# Patient Record
Sex: Female | Born: 1995
Health system: Southern US, Community
[De-identification: ages and names within clinical notes are randomized; demographics above are authoritative.]

## PROBLEM LIST (undated history)

## (undated) ENCOUNTER — Inpatient Hospital Stay (HOSPITAL_COMMUNITY): Payer: Self-pay

## (undated) DIAGNOSIS — F419 Anxiety disorder, unspecified: Secondary | ICD-10-CM

## (undated) DIAGNOSIS — Z3490 Encounter for supervision of normal pregnancy, unspecified, unspecified trimester: Secondary | ICD-10-CM

## (undated) DIAGNOSIS — O139 Gestational [pregnancy-induced] hypertension without significant proteinuria, unspecified trimester: Secondary | ICD-10-CM

## (undated) DIAGNOSIS — R05 Cough: Secondary | ICD-10-CM

## (undated) DIAGNOSIS — R058 Other specified cough: Secondary | ICD-10-CM

## (undated) DIAGNOSIS — J189 Pneumonia, unspecified organism: Secondary | ICD-10-CM

## (undated) DIAGNOSIS — M329 Systemic lupus erythematosus, unspecified: Secondary | ICD-10-CM

## (undated) DIAGNOSIS — R0789 Other chest pain: Secondary | ICD-10-CM

## (undated) DIAGNOSIS — IMO0001 Reserved for inherently not codable concepts without codable children: Secondary | ICD-10-CM

## (undated) DIAGNOSIS — J45909 Unspecified asthma, uncomplicated: Secondary | ICD-10-CM

## (undated) HISTORY — DX: Anxiety disorder, unspecified: F41.9

## (undated) HISTORY — DX: Gestational (pregnancy-induced) hypertension without significant proteinuria, unspecified trimester: O13.9

---
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
## 2014-09-10 ENCOUNTER — Encounter (HOSPITAL_COMMUNITY): Payer: Self-pay | Admitting: Emergency Medicine

## 2014-09-10 ENCOUNTER — Emergency Department (HOSPITAL_COMMUNITY): Payer: BLUE CROSS/BLUE SHIELD

## 2014-09-10 ENCOUNTER — Emergency Department (HOSPITAL_COMMUNITY)
Admission: EM | Admit: 2014-09-10 | Discharge: 2014-09-10 | Disposition: A | Discharge status: Home / Self Care | Payer: BLUE CROSS/BLUE SHIELD | Attending: Emergency Medicine | Admitting: Emergency Medicine

## 2014-09-10 DIAGNOSIS — Z79899 Other long term (current) drug therapy: Secondary | ICD-10-CM | POA: Diagnosis not present

## 2014-09-10 DIAGNOSIS — M329 Systemic lupus erythematosus, unspecified: Secondary | ICD-10-CM | POA: Insufficient documentation

## 2014-09-10 DIAGNOSIS — R6883 Chills (without fever): Secondary | ICD-10-CM | POA: Diagnosis not present

## 2014-09-10 DIAGNOSIS — R112 Nausea with vomiting, unspecified: Secondary | ICD-10-CM | POA: Diagnosis not present

## 2014-09-10 DIAGNOSIS — R630 Anorexia: Secondary | ICD-10-CM | POA: Diagnosis not present

## 2014-09-10 DIAGNOSIS — R197 Diarrhea, unspecified: Secondary | ICD-10-CM | POA: Insufficient documentation

## 2014-09-10 DIAGNOSIS — Z7952 Long term (current) use of systemic steroids: Secondary | ICD-10-CM | POA: Insufficient documentation

## 2014-09-10 DIAGNOSIS — Z792 Long term (current) use of antibiotics: Secondary | ICD-10-CM | POA: Insufficient documentation

## 2014-09-10 DIAGNOSIS — Z720 Tobacco use: Secondary | ICD-10-CM | POA: Insufficient documentation

## 2014-09-10 DIAGNOSIS — M79642 Pain in left hand: Secondary | ICD-10-CM | POA: Diagnosis present

## 2014-09-10 HISTORY — DX: Systemic lupus erythematosus, unspecified: M32.9

## 2014-09-10 LAB — CBC WITH DIFFERENTIAL/PLATELET
BASOS ABS: 0 10*3/uL (ref 0.0–0.1)
Basophils Relative: 0 % (ref 0–1)
EOS PCT: 0 % (ref 0–5)
Eosinophils Absolute: 0 10*3/uL (ref 0.0–0.7)
HEMATOCRIT: 33.7 % — AB (ref 36.0–46.0)
Hemoglobin: 11.5 g/dL — ABNORMAL LOW (ref 12.0–15.0)
LYMPHS ABS: 1 10*3/uL (ref 0.7–4.0)
Lymphocytes Relative: 14 % (ref 12–46)
MCH: 28 pg (ref 26.0–34.0)
MCHC: 34.1 g/dL (ref 30.0–36.0)
MCV: 82 fL (ref 78.0–100.0)
MONOS PCT: 6 % (ref 3–12)
Monocytes Absolute: 0.4 10*3/uL (ref 0.1–1.0)
Neutro Abs: 5.5 10*3/uL (ref 1.7–7.7)
Neutrophils Relative %: 80 % — ABNORMAL HIGH (ref 43–77)
Platelets: 293 10*3/uL (ref 150–400)
RBC: 4.11 MIL/uL (ref 3.87–5.11)
RDW: 14.6 % (ref 11.5–15.5)
WBC: 6.9 10*3/uL (ref 4.0–10.5)

## 2014-09-10 LAB — COMPREHENSIVE METABOLIC PANEL
ALBUMIN: 3.2 g/dL — AB (ref 3.5–5.2)
ALT: 15 U/L (ref 0–35)
ANION GAP: 9 (ref 5–15)
AST: 22 U/L (ref 0–37)
Alkaline Phosphatase: 37 U/L — ABNORMAL LOW (ref 39–117)
BUN: 13 mg/dL (ref 6–23)
CALCIUM: 8.8 mg/dL (ref 8.4–10.5)
CO2: 24 mmol/L (ref 19–32)
CREATININE: 0.74 mg/dL (ref 0.50–1.10)
Chloride: 103 mEq/L (ref 96–112)
GFR calc Af Amer: >90 mL/min (ref >90)
GLUCOSE: 97 mg/dL (ref 70–99)
Potassium: 3.1 mmol/L — ABNORMAL LOW (ref 3.5–5.1)
Sodium: 136 mmol/L (ref 135–145)
Total Bilirubin: 0.5 mg/dL (ref 0.3–1.2)
Total Protein: 7 g/dL (ref 6.0–8.3)

## 2014-09-10 MED ORDER — HYDROMORPHONE HCL 1 MG/ML IJ SOLN
1.0000 mg | Freq: Once | INTRAMUSCULAR | Status: AC
  Administered 2014-09-10: 1 mg via INTRAMUSCULAR
  Filled 2014-09-10: qty 1

## 2014-09-10 MED ORDER — OXYCODONE-ACETAMINOPHEN 5-325 MG PO TABS
1.0000 | ORAL_TABLET | Freq: Once | ORAL | Status: AC
  Administered 2014-09-10: 1 via ORAL
  Filled 2014-09-10: qty 1

## 2014-09-10 MED ORDER — PREDNISONE 20 MG PO TABS: 60.0000 mg | ORAL_TABLET | Freq: Every day | ORAL | Status: DC

## 2014-09-10 MED ORDER — PREDNISONE 20 MG PO TABS
60.0000 mg | ORAL_TABLET | Freq: Once | ORAL | Status: AC
  Administered 2014-09-10: 60 mg via ORAL
  Filled 2014-09-10: qty 3

## 2014-09-10 MED ORDER — OXYCODONE-ACETAMINOPHEN 5-325 MG PO TABS: 1.0000 | ORAL_TABLET | Freq: Four times a day (QID) | ORAL | Status: DC | PRN

## 2014-09-10 NOTE — Discharge Instructions (Signed)
Lupus Lupus (also called systemic lupus erythematosus, SLE) is a disorder of the body's natural defense system (immune system). In lupus, the immune system attacks various areas of the body (autoimmune disease). CAUSES The cause is unknown. However, lupus runs in families. Certain genes can make you more likely to develop lupus. It is 10 times more common in women than in men. Lupus is also more common in African Americans and Asians. Other factors also play a role, such as viruses (Epstein-Barr virus, EBV), stress, hormones, cigarette smoke, and certain drugs. SYMPTOMS Lupus can affect many parts of the body, including the joints, skin, kidneys, lungs, heart, nervous system, and blood vessels. The signs and symptoms of lupus differ from person to person. The disease can range from mild to life-threatening. Typical features of lupus include:  Butterfly-shaped rash over the face.  Arthritis involving one or more joints.  Kidney disease.  Fever, weight loss, hair loss, fatigue.  Poor circulation in the fingers and toes (Raynaud's disease).  Chest pain when taking deep breaths. Abdominal pain may also occur.  Skin rash in areas exposed to the sun.  Sores in the mouth and nose. DIAGNOSIS Diagnosing lupus can take a long time and is often difficult. An exam and an accurate account of your symptoms and health problems is very important. Blood tests are necessary, though no single test can confirm or rule out lupus. Most people with lupus test positive for antinuclear antibodies (ANA) on a blood test. Additional blood tests, a urine test (urinalysis), and sometimes a kidney or skin tissue sample (biopsy) can help to confirm or rule out lupus. TREATMENT There is no cure for lupus. Your caregiver will develop a treatment plan based on your age, sex, health, symptoms, and lifestyle. The goals are to prevent flares, to treat them when they do occur, and to minimize organ damage and complications. How  the disease may affect each person varies widely. Most people with lupus can live normal lives, but this disorder must be carefully monitored. Treatment must be adjusted as necessary to prevent serious complications. Medicines used for treatment:  Nonsteroidal anti-inflammatory drugs (NSAIDs) decrease inflammation and can help with chest pain, joint pain, and fevers. Examples include ibuprofen and naproxen.  Antimalarial drugs were designed to treat malaria. They also treat fatigue, joint pain, skin rashes, and inflammation of the lungs in patients with lupus.  Corticosteroids are powerful hormones that rapidly suppress inflammation. The lowest dose with the highest benefit will be chosen. They can be given by cream, pills, injections, and through the vein (intravenously).  Immunosuppressive drugs block the making of immune cells. They may be used for kidney or nerve disease. HOME CARE INSTRUCTIONS  Exercise. Low-impact activities can usually help keep joints flexible without being too strenuous.  Rest after periods of exercise.  Avoid excessive sun exposure.  Follow proper nutrition and take supplements as recommended by your caregiver.  Stress management can be helpful. SEEK MEDICAL CARE IF:  You have increased fatigue.  You develop pain.  You develop a rash.  You have an oral temperature above 102 F (38.9 C).  You develop abdominal discomfort.  You develop a headache.  You experience dizziness. FOR MORE INFORMATION National Institute of Neurological Disorders and Stroke: www.ninds.nih.gov American College of Rheumatology: www.rheumatology.org National Institute of Arthritis and Musculoskeletal and Skin Diseases: www.niams.nih.gov Document Released: 08/03/2002 Document Revised: 11/05/2011 Document Reviewed: 11/24/2009 ExitCare Patient Information 2015 [employer]. This information is not intended to replace advice given to you by your health   care provider. Make sure  you discuss any questions you have with your health care provider.  

## 2014-09-10 NOTE — ED Provider Notes (Signed)
CSN: [id number]     Arrival date & time 09/10/14  0346 History   First MD Initiated Contact with Patient 09/10/14 [phone]     Chief Complaint  Patient presents with  . Joint Pain    The patient said she has Lupus and she is having a "flare up".  She is complaining of swelling and pain in both hands and her lower neck.     (Consider location/radiation/quality/duration/timing/severity/associated sxs/prior Treatment) The history is provided by the patient.   patient with history of lupus. On plaque on all and 10 mg of prednisone. Or last few days she has been feeling worse. She's had some chills. She has some mild nausea and diarrhea. Not unusual for her flares. She has aches and swelling in her hands and face and back. She is managed primarily for this at [facility]. No abdominal pain. No cough or dysuria.  Past Medical History  Diagnosis Date  . Lupus (systemic lupus erythematosus)    History reviewed. No pertinent past surgical history. History reviewed. No pertinent family history. History  Substance Use Topics  . Smoking status: Current Every Day Smoker  . Smokeless tobacco: Never Used  . Alcohol Use: No   OB History    No data available     Review of Systems  Constitutional: Positive for chills and appetite change. Negative for fever.  Respiratory: Negative for cough and shortness of breath.   Cardiovascular: Negative for chest pain.  Gastrointestinal: Positive for nausea, vomiting and diarrhea.  Genitourinary: Negative for flank pain.  Musculoskeletal: Positive for back pain.  Skin: Negative for wound.      Allergies  Review of patient's allergies indicates no known allergies.  Home Medications   Prior to Admission medications   Medication Sig Start Date End Date Taking? Authorizing Provider  hydroxychloroquine (PLAQUENIL) 200 MG tablet Take 1 tablet by mouth daily. 08/21/14  Yes Historical Provider, MD  NAPROXEN DR 500 MG EC tablet Take 1 tablet by mouth daily. 07/09/14   Yes Historical Provider, MD  Norgestim-Eth Charlott Holler Triphasic (ORTHO TRI-CYCLEN LO PO) Take 1 tablet by mouth daily.   Yes Historical Provider, MD  oxyCODONE-acetaminophen (PERCOCET/ROXICET) 5-325 MG per tablet Take 1-2 tablets by mouth every 6 (six) hours as needed for severe pain. 09/10/14   Juliet Rude. Kelci Petrella, MD  predniSONE (DELTASONE) 20 MG tablet Take 3 tablets (60 mg total) by mouth daily. 09/10/14   Juliet Rude. Mert Dietrick, MD   BP 108/54 mmHg  Pulse 85  Temp(Src) 99.5 F (37.5 C) (Oral)  Resp 18  SpO2 98%  LMP 08/16/2014 Physical Exam  Constitutional: She is oriented to person, place, and time. She appears well-developed and well-nourished.  Patient appears uncomfortable  Cardiovascular: Normal rate and regular rhythm.   Pulmonary/Chest: Effort normal.  Abdominal: Soft. There is no tenderness.  Musculoskeletal:  Some swelling of face and bilateral hands. Some pain with movement of the joints.  Neurological: She is alert and oriented to person, place, and time.  Skin: Skin is warm.    ED Course  Procedures (including critical care time) Labs Review Labs Reviewed  CBC WITH DIFFERENTIAL - Abnormal; Notable for the following:    Hemoglobin 11.5 (*)    HCT 33.7 (*)    Neutrophils Relative % 80 (*)    All other components within normal limits  COMPREHENSIVE METABOLIC PANEL - Abnormal; Notable for the following:    Potassium 3.1 (*)    Albumin 3.2 (*)    Alkaline Phosphatase 37 (*)  All other components within normal limits    Imaging Review No results found.   EKG Interpretation None      MDM   Final diagnoses:  Chills  Lupus (systemic lupus erythematosus)    Patient with lupus flare. No infection. Will increase patients steroids and pain control and follow with her Rheumatologist.    Juliet RudeNathan R. Rubin PayorPickering, MD 09/13/14 1323

## 2014-09-10 NOTE — ED Notes (Addendum)
The patient said she has Lupus and she is having a "flare up".  She is complaining of swelling and pain in both hands and her lower neck.  The patient said she took tylenol and it is not getting better.  Her pain has gotten progressively worse so she decided to come in.  She rates her pain 10/10.  [facility] by [facility] Rheumatologist.

## 2015-04-21 ENCOUNTER — Emergency Department (HOSPITAL_COMMUNITY)
Admission: EM | Admit: 2015-04-21 | Discharge: 2015-04-21 | Disposition: A | Discharge status: Home / Self Care | Payer: BLUE CROSS/BLUE SHIELD | Attending: Emergency Medicine | Admitting: Emergency Medicine

## 2015-04-21 ENCOUNTER — Emergency Department (HOSPITAL_COMMUNITY): Payer: BLUE CROSS/BLUE SHIELD

## 2015-04-21 ENCOUNTER — Encounter (HOSPITAL_COMMUNITY): Payer: Self-pay | Admitting: Neurology

## 2015-04-21 DIAGNOSIS — R059 Cough, unspecified: Secondary | ICD-10-CM

## 2015-04-21 DIAGNOSIS — Z720 Tobacco use: Secondary | ICD-10-CM | POA: Insufficient documentation

## 2015-04-21 DIAGNOSIS — Z7952 Long term (current) use of systemic steroids: Secondary | ICD-10-CM | POA: Diagnosis not present

## 2015-04-21 DIAGNOSIS — R0789 Other chest pain: Secondary | ICD-10-CM | POA: Diagnosis not present

## 2015-04-21 DIAGNOSIS — R05 Cough: Secondary | ICD-10-CM | POA: Diagnosis present

## 2015-04-21 DIAGNOSIS — Z8701 Personal history of pneumonia (recurrent): Secondary | ICD-10-CM | POA: Diagnosis not present

## 2015-04-21 DIAGNOSIS — Z79899 Other long term (current) drug therapy: Secondary | ICD-10-CM | POA: Insufficient documentation

## 2015-04-21 DIAGNOSIS — M549 Dorsalgia, unspecified: Secondary | ICD-10-CM | POA: Insufficient documentation

## 2015-04-21 LAB — I-STAT CHEM 8, ED
BUN: 11 mg/dL (ref 6–20)
CALCIUM ION: 1.21 mmol/L (ref 1.12–1.23)
CHLORIDE: 106 mmol/L (ref 101–111)
Creatinine, Ser: 0.7 mg/dL (ref 0.44–1.00)
Glucose, Bld: 101 mg/dL — ABNORMAL HIGH (ref 65–99)
HEMATOCRIT: 34 % — AB (ref 36.0–46.0)
Hemoglobin: 11.6 g/dL — ABNORMAL LOW (ref 12.0–15.0)
POTASSIUM: 3.5 mmol/L (ref 3.5–5.1)
SODIUM: 142 mmol/L (ref 135–145)
TCO2: 22 mmol/L (ref 0–100)

## 2015-04-21 LAB — I-STAT BETA HCG BLOOD, ED (MC, WL, AP ONLY): I-stat hCG, quantitative: <5 m[IU]/mL (ref <5)

## 2015-04-21 MED ORDER — ALBUTEROL SULFATE HFA 108 (90 BASE) MCG/ACT IN AERS
2.0000 | INHALATION_SPRAY | RESPIRATORY_TRACT | Status: DC | PRN
  Administered 2015-04-21: 2 via RESPIRATORY_TRACT
  Filled 2015-04-21: qty 6.7

## 2015-04-21 MED ORDER — AEROCHAMBER PLUS W/MASK MISC
1.0000 | Freq: Once | Status: AC
  Administered 2015-04-21: 1

## 2015-04-21 NOTE — ED Provider Notes (Signed)
CSN: [id number]     Arrival date & time 04/21/15  1516 History   First MD Initiated Contact with Patient 04/21/15 1631     Chief Complaint  Patient presents with  . Cough     (Consider location/radiation/quality/duration/timing/severity/associated sxs/prior Treatment) HPI Comments: She is a 19 year old with a history of lupus, and pneumonia with effusion approximately 1 year ago who presents for cough 2 weeks. No fever. The cough comes and goes. Occasionally patient has pain in her back. No vomiting.  Patient is a Consulting civil engineer at [employer], and her primary doctor is in [place]  Patient is a 19 y.o. female presenting with cough. The history is provided by the patient. No language interpreter was used.  Cough Cough characteristics:  Non-productive Severity:  Moderate Onset quality:  Sudden Duration:  2 weeks Timing:  Intermittent Progression:  Unchanged Chronicity:  New Context: upper respiratory infection   Relieved by:  None tried Worsened by:  Nothing tried Ineffective treatments:  None tried Associated symptoms: no chills, no diaphoresis, no fever, no rash and no weight loss   Risk factors: no recent infection     Past Medical History  Diagnosis Date  . Lupus (systemic lupus erythematosus)    History reviewed. No pertinent past surgical history. No family history on file. Social History  Substance Use Topics  . Smoking status: Current Every Day Smoker  . Smokeless tobacco: Never Used  . Alcohol Use: No   OB History    No data available     Review of Systems  Constitutional: Negative for fever, chills, weight loss and diaphoresis.  Respiratory: Positive for cough.   Skin: Negative for rash.  All other systems reviewed and are negative.     Allergies  Review of patient's allergies indicates no known allergies.  Home Medications   Prior to Admission medications   Medication Sig Start Date End Date Taking? Authorizing Provider  hydroxychloroquine (PLAQUENIL) 200 MG  tablet Take 1 tablet by mouth daily. 08/21/14   Historical Provider, MD  NAPROXEN DR 500 MG EC tablet Take 1 tablet by mouth daily. 07/09/14   Historical Provider, MD  Norgestim-Eth Charlott Holler Triphasic (ORTHO TRI-CYCLEN LO PO) Take 1 tablet by mouth daily.    Historical Provider, MD  oxyCODONE-acetaminophen (PERCOCET/ROXICET) 5-325 MG per tablet Take 1-2 tablets by mouth every 6 (six) hours as needed for severe pain. 09/10/14   Benjiman Core, MD  predniSONE (DELTASONE) 20 MG tablet Take 3 tablets (60 mg total) by mouth daily. 09/10/14   Benjiman Core, MD   BP 107/83 mmHg  Pulse 67  Temp(Src) 98.4 F (36.9 C) (Oral)  Resp 16  SpO2 98%  LMP 03/26/2015 Physical Exam  Constitutional: She is oriented to person, place, and time. She appears well-developed and well-nourished.  HENT:  Head: Normocephalic and atraumatic.  Right Ear: External ear normal.  Left Ear: External ear normal.  Mouth/Throat: Oropharynx is clear and moist.  Eyes: Conjunctivae and EOM are normal.  Neck: Normal range of motion. Neck supple.  Cardiovascular: Normal rate, normal heart sounds and intact distal pulses.   Pulmonary/Chest: Effort normal and breath sounds normal. She has no wheezes. She has no rales. She exhibits no tenderness.  Abdominal: Soft. Bowel sounds are normal. There is no tenderness. There is no rebound.  Musculoskeletal: Normal range of motion.  Neurological: She is alert and oriented to person, place, and time.  Skin: Skin is warm.  Nursing note and vitals reviewed.   ED Course  Procedures (including critical care time)  Labs Review Labs Reviewed  I-STAT CHEM 8, ED - Abnormal; Notable for the following:    Glucose, Bld 101 (*)    Hemoglobin 11.6 (*)    HCT 34.0 (*)    All other components within normal limits  I-STAT BETA HCG BLOOD, ED ([facility], [facility], [facility] ONLY)    Imaging Review Dg Chest 2 View  04/21/2015   CLINICAL DATA:  Cough x2 weeks, chest pain x1 week  EXAM: CHEST  2 VIEW  COMPARISON:   09/10/2014  FINDINGS: Lungs are clear.  No pleural effusion or pneumothorax.  The heart is normal in size.  Visualized osseous structures are within normal limits.  IMPRESSION: Normal chest radiographs.   Electronically Signed   By: Charline Bills M.D.   On: 04/21/2015 15:59   I have personally reviewed and evaluated these images and lab results as part of my medical decision-making.   EKG Interpretation   Date/Time:  Thursday April 21 2015 15:21:29 EDT Ventricular Rate:  94 PR Interval:  144 QRS Duration: 80 QT Interval:  342 QTC Calculation: 427 R Axis:   80 Text Interpretation:  Normal sinus rhythm Nonspecific T wave abnormality  Abnormal ECG no stemi, normal qtc, no delta Confirmed by Tonette Lederer MD, Tenny Craw  [phone]) on 04/21/2015 4:58:34 PM      MDM   Final diagnoses:  Cough    19 year old who presents for cough and chest tightness 2 week.  Patient with a history of pneumonia and effusion. We'll obtain chest x-ray, will obtain EKG.   Will obtain istat and hcg.  Labs reviewed and normal.  ekg with normal sinus.  cxr show no pneumonia, no effusion.   Will do a trial of albuterol for home.  Discussed signs that warrant reevaluation. Will have follow up with pcp in 1 week if not improved.      Niel Hummer, MD 04/21/15 [phone]

## 2015-04-21 NOTE — ED Notes (Signed)
Pt reports cough for 2 weeks and tightness in chest for 1 week that comes and goes. Pt is a x 4. Denies cardiac hx, has lupus.

## 2015-04-21 NOTE — Discharge Instructions (Signed)
Cough, Adult  A cough is a reflex that helps clear your throat and airways. It can help heal the body or may be a reaction to an irritated airway. A cough may only last 2 or 3 weeks (acute) or may last more than 8 weeks (chronic).  CAUSES Acute cough:  Viral or bacterial infections. Chronic cough:  Infections.  Allergies.  Asthma.  Post-nasal drip.  Smoking.  Heartburn or acid reflux.  Some medicines.  Chronic lung problems (COPD).  Cancer. SYMPTOMS   Cough.  Fever.  Chest pain.  Increased breathing rate.  High-pitched whistling sound when breathing (wheezing).  Colored mucus that you cough up (sputum). TREATMENT   A bacterial cough may be treated with antibiotic medicine.  A viral cough must run its course and will not respond to antibiotics.  Your caregiver may recommend other treatments if you have a chronic cough. HOME CARE INSTRUCTIONS   Only take over-the-counter or prescription medicines for pain, discomfort, or fever as directed by your caregiver. Use cough suppressants only as directed by your caregiver.  Use a cold steam vaporizer or humidifier in your bedroom or home to help loosen secretions.  Sleep in a semi-upright position if your cough is worse at night.  Rest as needed.  Stop smoking if you smoke. SEEK IMMEDIATE MEDICAL CARE IF:   You have pus in your sputum.  Your cough starts to worsen.  You cannot control your cough with suppressants and are losing sleep.  You begin coughing up blood.  You have difficulty breathing.  You develop pain which is getting worse or is uncontrolled with medicine.  You have a fever. MAKE SURE YOU:   Understand these instructions.  Will watch your condition.  Will get help right away if you are not doing well or get worse. Document Released: 02/09/2011 Document Revised: 11/05/2011 Document Reviewed: 02/09/2011 ExitCare Patient Information 2015 [employer]. This information is not intended  to replace advice given to you by your health care provider. Make sure you discuss any questions you have with your health care provider.  

## 2015-06-17 ENCOUNTER — Emergency Department (HOSPITAL_COMMUNITY)
Admission: EM | Admit: 2015-06-17 | Discharge: 2015-06-17 | Disposition: A | Discharge status: Home / Self Care | Payer: BLUE CROSS/BLUE SHIELD | Attending: Emergency Medicine | Admitting: Emergency Medicine

## 2015-06-17 ENCOUNTER — Encounter (HOSPITAL_COMMUNITY): Payer: Self-pay | Admitting: Cardiology

## 2015-06-17 DIAGNOSIS — R51 Headache: Secondary | ICD-10-CM | POA: Diagnosis present

## 2015-06-17 DIAGNOSIS — Z3202 Encounter for pregnancy test, result negative: Secondary | ICD-10-CM | POA: Insufficient documentation

## 2015-06-17 DIAGNOSIS — M328 Other forms of systemic lupus erythematosus: Secondary | ICD-10-CM | POA: Diagnosis not present

## 2015-06-17 DIAGNOSIS — M329 Systemic lupus erythematosus, unspecified: Secondary | ICD-10-CM

## 2015-06-17 DIAGNOSIS — Z79899 Other long term (current) drug therapy: Secondary | ICD-10-CM | POA: Diagnosis not present

## 2015-06-17 DIAGNOSIS — Z7952 Long term (current) use of systemic steroids: Secondary | ICD-10-CM | POA: Diagnosis not present

## 2015-06-17 DIAGNOSIS — Z791 Long term (current) use of non-steroidal anti-inflammatories (NSAID): Secondary | ICD-10-CM | POA: Insufficient documentation

## 2015-06-17 DIAGNOSIS — Z720 Tobacco use: Secondary | ICD-10-CM | POA: Insufficient documentation

## 2015-06-17 LAB — POC URINE PREG, ED: Preg Test, Ur: NEGATIVE

## 2015-06-17 MED ORDER — PREDNISONE 20 MG PO TABS: 60.0000 mg | ORAL_TABLET | Freq: Every day | ORAL | Status: DC

## 2015-06-17 MED ORDER — HYDROXYCHLOROQUINE SULFATE 200 MG PO TABS: 200.0000 mg | ORAL_TABLET | Freq: Every day | ORAL | Status: DC

## 2015-06-17 MED ORDER — OXYCODONE-ACETAMINOPHEN 5-325 MG PO TABS: 1.0000 | ORAL_TABLET | Freq: Four times a day (QID) | ORAL | Status: DC | PRN

## 2015-06-17 MED ORDER — OXYCODONE-ACETAMINOPHEN 5-325 MG PO TABS
2.0000 | ORAL_TABLET | Freq: Once | ORAL | Status: AC
  Administered 2015-06-17: 2 via ORAL
  Filled 2015-06-17: qty 2

## 2015-06-17 MED ORDER — PREDNISONE 20 MG PO TABS
60.0000 mg | ORAL_TABLET | Freq: Once | ORAL | Status: AC
  Administered 2015-06-17: 60 mg via ORAL
  Filled 2015-06-17: qty 3

## 2015-06-17 NOTE — ED Provider Notes (Signed)
CSN: [id number]     Arrival date & time 06/17/15  1334 History   First MD Initiated Contact with Patient 06/17/15 1500     Chief Complaint  Patient presents with  . Headache  . Nausea     (Consider location/radiation/quality/duration/timing/severity/associated sxs/prior Treatment) HPI Comments: Patient here complaining of flare of her lupus times several days. She notes pain in her joints which is similar to her prior lupus flares. She has been out of her Plaquenil for over a month. Has been compliant with her chronic prednisone dose. Denies any fever or chills. No cough or congestion. Denies any dyspnea. No chest pain or abdominal pain. No increased rashes noted. Pain is characterized as persistent and sharp worse with movement and better with rest. Has used Tylenol without relief  Patient is a [age] y.o. female presenting with headaches. The history is provided by the patient.  Headache   Past Medical History  Diagnosis Date  . Lupus (systemic lupus erythematosus) (HCC)    History reviewed. No pertinent past surgical history. History reviewed. No pertinent family history. Social History  Substance Use Topics  . Smoking status: Current Every Day Smoker  . Smokeless tobacco: Never Used  . Alcohol Use: Yes   OB History    No data available     Review of Systems  Neurological: Positive for headaches.  All other systems reviewed and are negative.     Allergies  Review of patient's allergies indicates no known allergies.  Home Medications   Prior to Admission medications   Medication Sig Start Date End Date Taking? Authorizing Provider  hydroxychloroquine (PLAQUENIL) 200 MG tablet Take 1 tablet by mouth daily. 08/21/14   Historical Provider, MD  NAPROXEN DR 500 MG EC tablet Take 1 tablet by mouth daily. 07/09/14   Historical Provider, MD  Norgestim-Eth Charlott HollerEstrad Triphasic (ORTHO TRI-CYCLEN LO PO) Take 1 tablet by mouth daily.    Historical Provider, MD   oxyCODONE-acetaminophen (PERCOCET/ROXICET) 5-325 MG per tablet Take 1-2 tablets by mouth every 6 (six) hours as needed for severe pain. 09/10/14   Benjiman CoreNathan Pickering, MD  predniSONE (DELTASONE) 20 MG tablet Take 3 tablets (60 mg total) by mouth daily. 09/10/14   Benjiman CoreNathan Pickering, MD   BP 127/88 mmHg  Pulse 97  Temp(Src) 97.8 F (36.6 C) (Oral)  Resp 18  Wt 130 lb 6.4 oz (59.149 kg)  SpO2 100%  LMP 06/09/2015 Physical Exam  Constitutional: She is oriented to person, place, and time. She appears well-developed and well-nourished.  Non-toxic appearance. No distress.  HENT:  Head: Normocephalic and atraumatic. Head is without abrasion, without contusion and without laceration.  Nose: Nose normal.  Mouth/Throat: Oropharynx is clear and moist and mucous membranes are normal.  Eyes: Conjunctivae and EOM are normal. Pupils are equal, round, and reactive to light.  Neck: Trachea normal and normal range of motion. Neck supple. No spinous process tenderness and no muscular tenderness present. No tracheal deviation present.  Cardiovascular: Normal rate, regular rhythm, normal heart sounds and normal pulses.  Exam reveals no gallop.   No murmur heard. Pulmonary/Chest: Effort normal. No stridor. No respiratory distress. She has no decreased breath sounds. She has no wheezes. She exhibits no crepitus, no deformity and no swelling.  Abdominal: Soft. Normal appearance and bowel sounds are normal. She exhibits no distension. There is no tenderness. There is no rebound and no CVA tenderness.  Musculoskeletal: Normal range of motion. She exhibits no edema or tenderness.  Neurological: She is alert and oriented to  person, place, and time. She has normal strength. No cranial nerve deficit or sensory deficit. GCS eye subscore is 4. GCS verbal subscore is 5. GCS motor subscore is 6.  Skin: Skin is warm and dry.  Psychiatric: She has a normal mood and affect. Her speech is normal and behavior is normal.  Nursing  note and vitals reviewed.   ED Course  Procedures (including critical care time) Labs Review Labs Reviewed  POC URINE PREG, ED    Imaging Review No results found. I have personally reviewed and evaluated these images and lab results as part of my medical decision-making.   EKG Interpretation None      MDM   Final diagnoses:  None    Patient will be placed on prednisone, given refill for her prescription for Plaquenil, and placed on increasing dose of prednisone    Lorre Nick, MD 06/17/15 1630

## 2015-06-17 NOTE — ED Notes (Signed)
Reports a headache and nausea that has been intermittent for the past couple of months. Also reports joint pain in the left ring finger.

## 2015-06-17 NOTE — Discharge Instructions (Signed)
Systemic Lupus Erythematosus, Adult  Systemic lupus erythematosus is a long-term (chronic) disease that can affect many parts of the body. It can damage the skin, joints, blood vessels, brain, kidneys, lungs, heart, and other internal organs. It causes pain, irritation, and inflammation.  Systemic lupus erythematosus is an autoimmune disease. With this type of disease, the body's defense system (immune system) mistakenly attacks normal tissues instead of attacking germs or abnormal growths.  CAUSES  The cause of this condition is not known.  RISK FACTORS  This condition is more likely to develop in:  · Females.  · People of Asian descent.  · People of African-American descent.  · People who have a family history of the condition.  SYMPTOMS  General symptoms include:  · Joint pain and swelling (common).  · Fever.  · Fatigue.  · Unusual weight loss or weight gain.  · Skin rashes, especially over the nose and cheeks (butterfly rash) and after sun exposure.  · Sores inside the mouth or nose.  Other symptoms depend on which parts of the body are affected. They can include:  · Shortness of breath.  · Chest pain.  · Frequent urination.  · Blood in the urine.  · Seizures.  · Mental changes.  · Hair loss.  · Swollen and tender lymph nodes.  · Swelling of the hands or feet.  Symptoms can come and go. A period of time when symptoms get worse or come back is called a flare. A period of time with no symptoms is called a remission.  DIAGNOSIS  This condition is diagnosed based on symptoms, a medical history, and a physical exam. You may also have tests, including:  · Blood tests.  · Urine tests.  · A chest X-ray.  · A skin or kidney biopsy. For this test, a sample of tissue is taken from the skin or kidney and studied under a microscope.  You may be referred to an autoimmune disease specialist (rheumatologist).  TREATMENT  There is no cure for this condition, but treatment can keep the disease in remission, help to control  symptoms, and prevent damage to the heart, lungs, kidneys, and other organs. Treatment may involve taking a combination of medicines over time.  HOME CARE INSTRUCTIONS  Medicines  · Take medicines only as directed by your health care provider.  · Do not take any medicines that contain estrogen without first checking with your health care provider. Estrogen can trigger flares and may increase your risk for blood clots.  Lifestyle  · Eat a heart-healthy diet.  · Stay active as directed by your health care provider.  · Do not smoke. If you need help quitting, ask your health care provider.  · Protect your skin from the sun by applying sunblock and wearing protective hats and clothing.  · Learn as much as you can about your condition and have a good support system in place. Support may come from family, friends, or a lupus support group.  General Instructions  · Keep all follow-up visits as directed by your health care provider. This is important.  · Work closely with all of your health care providers to manage your condition.  · Let your health care provider know right away if you become pregnant or if you plan to become pregnant. Pregnancy in women with this condition is considered high risk.  SEEK MEDICAL CARE IF:  · You have a fever.  · Your symptoms flare.  · You develop new symptoms.  ·   You develop swollen feet or hands.  · You develop puffiness around your eyes.  · Your medicines are not working.  · You have bloody, foamy, or coffee-colored urine.  · There are changes in your urination. For example, you urinate more often at night.  · You think that you may be depressed or have anxiety.  SEEK IMMEDIATE MEDICAL CARE IF:  · You have chest pain.  · You have trouble breathing.  · You have a seizure.  · You suddenly get a very bad headache.  · You suddenly develop facial or body weakness.  · You cannot speak.  · You cannot understand speech.     This information is not intended to replace advice given to you by your  health care provider. Make sure you discuss any questions you have with your health care provider.     Document Released: 08/03/2002 Document Revised: 12/28/2014 Document Reviewed: 07/21/2014  Elsevier Interactive Patient Education ©2016 [employer]

## 2015-06-17 NOTE — ED Notes (Signed)
Pt reports not having one of her lupus meds for over a month. Sts she hasnt seen her rheumatologist in a while sice she is here for school . Pt sts "It hink my lupus is flaring up"

## 2015-08-01 ENCOUNTER — Encounter (HOSPITAL_COMMUNITY): Payer: Self-pay | Admitting: *Deleted

## 2015-08-01 ENCOUNTER — Emergency Department (HOSPITAL_COMMUNITY): Payer: BLUE CROSS/BLUE SHIELD

## 2015-08-01 ENCOUNTER — Emergency Department (HOSPITAL_COMMUNITY)
Admission: EM | Admit: 2015-08-01 | Discharge: 2015-08-02 | Disposition: A | Discharge status: Home / Self Care | Payer: BLUE CROSS/BLUE SHIELD | Attending: Emergency Medicine | Admitting: Emergency Medicine

## 2015-08-01 DIAGNOSIS — N390 Urinary tract infection, site not specified: Secondary | ICD-10-CM | POA: Diagnosis not present

## 2015-08-01 DIAGNOSIS — Z79899 Other long term (current) drug therapy: Secondary | ICD-10-CM | POA: Insufficient documentation

## 2015-08-01 DIAGNOSIS — R509 Fever, unspecified: Secondary | ICD-10-CM | POA: Diagnosis present

## 2015-08-01 DIAGNOSIS — F17200 Nicotine dependence, unspecified, uncomplicated: Secondary | ICD-10-CM | POA: Diagnosis not present

## 2015-08-01 DIAGNOSIS — Z3202 Encounter for pregnancy test, result negative: Secondary | ICD-10-CM | POA: Insufficient documentation

## 2015-08-01 DIAGNOSIS — R000 Tachycardia, unspecified: Secondary | ICD-10-CM | POA: Diagnosis not present

## 2015-08-01 DIAGNOSIS — R51 Headache: Secondary | ICD-10-CM | POA: Insufficient documentation

## 2015-08-01 LAB — CBC
HCT: 35.1 % — ABNORMAL LOW (ref 36.0–46.0)
Hemoglobin: 11.4 g/dL — ABNORMAL LOW (ref 12.0–15.0)
MCH: 28.1 pg (ref 26.0–34.0)
MCHC: 32.5 g/dL (ref 30.0–36.0)
MCV: 86.7 fL (ref 78.0–100.0)
PLATELETS: 221 10*3/uL (ref 150–400)
RBC: 4.05 MIL/uL (ref 3.87–5.11)
RDW: 14.3 % (ref 11.5–15.5)
WBC: 7.6 10*3/uL (ref 4.0–10.5)

## 2015-08-01 LAB — COMPREHENSIVE METABOLIC PANEL
ALK PHOS: 38 U/L (ref 38–126)
ALT: 14 U/L (ref 14–54)
AST: 18 U/L (ref 15–41)
Albumin: 3.2 g/dL — ABNORMAL LOW (ref 3.5–5.0)
Anion gap: 4 — ABNORMAL LOW (ref 5–15)
BILIRUBIN TOTAL: 0.8 mg/dL (ref 0.3–1.2)
BUN: 8 mg/dL (ref 6–20)
CHLORIDE: 103 mmol/L (ref 101–111)
CO2: 28 mmol/L (ref 22–32)
Calcium: 8.3 mg/dL — ABNORMAL LOW (ref 8.9–10.3)
Creatinine, Ser: 0.87 mg/dL (ref 0.44–1.00)
GFR calc Af Amer: >60 mL/min (ref >60)
Glucose, Bld: 107 mg/dL — ABNORMAL HIGH (ref 65–99)
Potassium: 3.5 mmol/L (ref 3.5–5.1)
Sodium: 135 mmol/L (ref 135–145)
TOTAL PROTEIN: 6.5 g/dL (ref 6.5–8.1)

## 2015-08-01 LAB — I-STAT CG4 LACTIC ACID, ED
Lactic Acid, Venous: 1.38 mmol/L (ref 0.5–2.0)
Lactic Acid, Venous: 1.23 mmol/L (ref 0.5–2.0)

## 2015-08-01 LAB — URINE MICROSCOPIC-ADD ON

## 2015-08-01 LAB — URINALYSIS, ROUTINE W REFLEX MICROSCOPIC
Glucose, UA: NEGATIVE mg/dL
HGB URINE DIPSTICK: NEGATIVE
Ketones, ur: 15 mg/dL — AB
Nitrite: NEGATIVE
Protein, ur: 30 mg/dL — AB
SPECIFIC GRAVITY, URINE: 1.027 (ref 1.005–1.030)
pH: 7.5 (ref 5.0–8.0)

## 2015-08-01 LAB — I-STAT BETA HCG BLOOD, ED (MC, WL, AP ONLY): I-stat hCG, quantitative: <5 m[IU]/mL (ref <5)

## 2015-08-01 MED ORDER — ACETAMINOPHEN 325 MG PO TABS
650.0000 mg | ORAL_TABLET | ORAL | Status: DC | PRN
  Administered 2015-08-01: 650 mg via ORAL
  Filled 2015-08-01: qty 2

## 2015-08-01 MED ORDER — IBUPROFEN 200 MG PO TABS: 600.0000 mg | ORAL_TABLET | Freq: Once | ORAL | Status: DC

## 2015-08-01 MED ORDER — CEFTRIAXONE SODIUM 1 G IJ SOLR
1.0000 g | Freq: Once | INTRAMUSCULAR | Status: AC
  Administered 2015-08-01: 1 g via INTRAVENOUS
  Filled 2015-08-01: qty 10

## 2015-08-01 MED ORDER — SODIUM CHLORIDE 0.9 % IV BOLUS (SEPSIS)
1000.0000 mL | Freq: Once | INTRAVENOUS | Status: AC
  Administered 2015-08-01: 1000 mL via INTRAVENOUS

## 2015-08-01 MED ORDER — ACETAMINOPHEN 325 MG PO TABS
ORAL_TABLET | ORAL | Status: DC
Start: 2015-08-01 — End: 2015-08-02
  Filled 2015-08-01: qty 2

## 2015-08-01 MED ORDER — ACETAMINOPHEN 325 MG PO TABS
650.0000 mg | ORAL_TABLET | Freq: Once | ORAL | Status: AC | PRN
  Administered 2015-08-01: 650 mg via ORAL

## 2015-08-01 NOTE — ED Provider Notes (Signed)
CSN: [id number]     Arrival date & time 08/01/15  1619 History   First MD Initiated Contact with Patient 08/01/15 2059     Chief Complaint  Patient presents with  . Fever  . Nausea     (Consider location/radiation/quality/duration/timing/severity/associated sxs/prior Treatment) HPI Patient presents with acute onset fever starting at 2 AM this morning. She states she's had 2 episodes of vomiting and diarrhea. Patient complains of mild throbbing frontal headache which is mostly resolved at this point. She denies any neck pain or stiffness. She states she's had some nasal congestion but denies any sore throat, chest pain, shortness of breath, cough, abdominal pain, myalgias, rash, urinary frequency, dysuria, hematuria or flank pain. No recent sick contacts. Past Medical History  Diagnosis Date  . Lupus (systemic lupus erythematosus) (HCC)    History reviewed. No pertinent past surgical history. History reviewed. No pertinent family history. Social History  Substance Use Topics  . Smoking status: Current Every Day Smoker  . Smokeless tobacco: Never Used  . Alcohol Use: Yes   OB History    No data available     Review of Systems  Constitutional: Positive for fever, chills, diaphoresis and appetite change. Negative for activity change.  HENT: Positive for congestion. Negative for ear pain, rhinorrhea, sinus pressure and sore throat.   Eyes: Negative for visual disturbance.  Respiratory: Negative for cough, shortness of breath and wheezing.   Cardiovascular: Negative for chest pain, palpitations and leg swelling.  Gastrointestinal: Positive for nausea, vomiting and diarrhea. Negative for abdominal pain, constipation and blood in stool.  Genitourinary: Negative for dysuria, frequency, flank pain, vaginal bleeding, vaginal discharge and difficulty urinating.  Musculoskeletal: Negative for myalgias, back pain, arthralgias, neck pain and neck stiffness.  Skin: Negative for rash and wound.   Neurological: Positive for headaches. Negative for dizziness, syncope, weakness, light-headedness and numbness.  Hematological: Negative for adenopathy.  All other systems reviewed and are negative.     Allergies  Review of patient's allergies indicates no known allergies.  Home Medications   Prior to Admission medications   Medication Sig Start Date End Date Taking? Authorizing Provider  albuterol (PROVENTIL HFA;VENTOLIN HFA) 108 (90 BASE) MCG/ACT inhaler Inhale 1-2 puffs into the lungs every 6 (six) hours as needed for wheezing or shortness of breath.   Yes Historical Provider, MD  hydroxychloroquine (PLAQUENIL) 200 MG tablet Take 1 tablet (200 mg total) by mouth daily. Patient taking differently: Take 300 mg by mouth daily.  06/17/15  Yes Lorre Nick, MD  mycophenolate (CELLCEPT) 500 MG tablet Take 1,000 mg by mouth 2 (two) times daily. 07/15/15 07/14/16 Yes Historical Provider, MD  predniSONE (DELTASONE) 10 MG tablet Take 10 mg by mouth daily. 07/15/15  Yes Historical Provider, MD  oxyCODONE-acetaminophen (PERCOCET/ROXICET) 5-325 MG tablet Take 1-2 tablets by mouth every 6 (six) hours as needed for severe pain. Patient not taking: Reported on 08/01/2015 06/17/15   Lorre Nick, MD  predniSONE (DELTASONE) 20 MG tablet Take 3 tablets (60 mg total) by mouth daily. Patient not taking: Reported on 08/01/2015 06/17/15   Lorre Nick, MD   BP 124/84 mmHg  Pulse 116  Temp(Src) 102 F (38.9 C) (Oral)  Resp 24  SpO2 100%  LMP 07/10/2015 Physical Exam  Constitutional: She is oriented to person, place, and time. She appears well-developed and well-nourished. No distress.  HENT:  Head: Normocephalic and atraumatic.  Mouth/Throat: Oropharynx is clear and moist.  No sinus tenderness. No tonsillar erythema or exudates. Uvula is midline. Patient has  bilateral nasal mucosal edema.  Eyes: EOM are normal. Pupils are equal, round, and reactive to light. Right eye exhibits no discharge. Left  eye exhibits no discharge.  Neck: Normal range of motion. Neck supple.  No meningismus  Cardiovascular: Regular rhythm.  Exam reveals no gallop and no friction rub.   No murmur heard. Tachycardia  Pulmonary/Chest: Effort normal and breath sounds normal. No respiratory distress. She has no wheezes. She has no rales. She exhibits no tenderness.  Abdominal: Soft. Bowel sounds are normal. She exhibits no distension and no mass. There is tenderness (patient with mild lower abdominal tenderness to palpation. Rebound or guarding.). There is no rebound and no guarding.  Musculoskeletal: Normal range of motion. She exhibits no edema or tenderness.  No CVA tenderness. No joint swelling, erythema or warmth. No lower extremity swelling or tenderness.  Lymphadenopathy:    She has no cervical adenopathy.  Neurological: She is alert and oriented to person, place, and time.  Skin: Skin is warm and dry. No rash noted. No erythema.  Psychiatric: She has a normal mood and affect. Her behavior is normal.  Nursing note and vitals reviewed.   ED Course  Procedures (including critical care time) Labs Review Labs Reviewed  COMPREHENSIVE METABOLIC PANEL - Abnormal; Notable for the following:    Glucose, Bld 107 (*)    Calcium 8.3 (*)    Albumin 3.2 (*)    Anion gap 4 (*)    All other components within normal limits  CBC - Abnormal; Notable for the following:    Hemoglobin 11.4 (*)    HCT 35.1 (*)    All other components within normal limits  URINALYSIS, ROUTINE W REFLEX MICROSCOPIC (NOT AT [facility]) - Abnormal; Notable for the following:    Color, Urine AMBER (*)    APPearance CLOUDY (*)    Bilirubin Urine SMALL (*)    Ketones, ur 15 (*)    Protein, ur 30 (*)    Leukocytes, UA SMALL (*)    All other components within normal limits  URINE MICROSCOPIC-ADD ON - Abnormal; Notable for the following:    Squamous Epithelial / LPF 6-30 (*)    Bacteria, UA MANY (*)    All other components within normal limits   I-STAT CG4 LACTIC ACID, ED  I-STAT BETA HCG BLOOD, ED ([facility], [facility], [facility] ONLY)  I-STAT CG4 LACTIC ACID, ED    Imaging Review Dg Chest 2 View  08/01/2015  CLINICAL DATA:  Acute onset of fever and diaphoresis. Joint aching. Initial encounter. EXAM: CHEST  2 VIEW COMPARISON:  Chest radiograph performed 04/21/2015 FINDINGS: The lungs are well-aerated and clear. There is no evidence of focal opacification, pleural effusion or pneumothorax. The heart is normal in size; the mediastinal contour is within normal limits. No acute osseous abnormalities are seen. Metallic nipple piercings are noted bilaterally. IMPRESSION: No acute cardiopulmonary process seen. Electronically Signed   By: Roanna Raider M.D.   On: 08/01/2015 21:52   I have personally reviewed and evaluated these images and lab results as part of my medical decision-making.   EKG Interpretation None      MDM   Final diagnoses:  UTI (lower urinary tract infection)  Fever, unspecified fever cause    Patient is very well-appearing. She is in no apparent distress. Has history of lupus and is currently on daily prednisone.  Patient continues to be very well-appearing. She is taking on the phone in no distress. Evidence of urinary tract infection on UA. Given IV  Rocephin in the emergency department. Patient's tachycardia has resolved. Stable blood pressure. Given the patient's normal labs and well appearance will discharge home to follow-up with her primary physician in one to 2 days. She understands the need to return immediately for any worsening symptoms or concerns.    Loren Raceravid Ronon Ferger, MD 08/02/15 [id number]

## 2015-08-01 NOTE — ED Notes (Addendum)
Pt reports that she has lupus. Having joint pain, fever, nausea, headache x 2 days. Febrile at triage, last took tylenol at 0700.

## 2015-08-02 DIAGNOSIS — N390 Urinary tract infection, site not specified: Secondary | ICD-10-CM | POA: Diagnosis not present

## 2015-08-02 MED ORDER — CEPHALEXIN 250 MG PO CAPS: 250.0000 mg | ORAL_CAPSULE | Freq: Four times a day (QID) | ORAL | Status: DC

## 2015-08-02 NOTE — Discharge Instructions (Signed)

## 2015-09-28 DIAGNOSIS — R0789 Other chest pain: Secondary | ICD-10-CM

## 2015-09-28 DIAGNOSIS — IMO0001 Reserved for inherently not codable concepts without codable children: Secondary | ICD-10-CM

## 2015-09-28 HISTORY — DX: Reserved for inherently not codable concepts without codable children: IMO0001

## 2015-09-28 HISTORY — DX: Other chest pain: R07.89

## 2015-10-04 ENCOUNTER — Other Ambulatory Visit: Payer: Self-pay | Admitting: Rheumatology

## 2015-10-04 DIAGNOSIS — R51 Headache: Principal | ICD-10-CM

## 2015-10-04 DIAGNOSIS — R519 Headache, unspecified: Secondary | ICD-10-CM

## 2015-10-04 DIAGNOSIS — M329 Systemic lupus erythematosus, unspecified: Secondary | ICD-10-CM

## 2015-10-10 ENCOUNTER — Ambulatory Visit
Admission: RE | Admit: 2015-10-10 | Discharge: 2015-10-10 | Disposition: A | Discharge status: Home / Self Care | Payer: BLUE CROSS/BLUE SHIELD | Source: Ambulatory Visit | Attending: Rheumatology | Admitting: Rheumatology

## 2015-10-10 DIAGNOSIS — R519 Headache, unspecified: Secondary | ICD-10-CM

## 2015-10-10 DIAGNOSIS — M329 Systemic lupus erythematosus, unspecified: Secondary | ICD-10-CM

## 2015-10-10 DIAGNOSIS — R51 Headache: Principal | ICD-10-CM

## 2015-10-20 ENCOUNTER — Encounter (HOSPITAL_COMMUNITY)
Admission: RE | Admit: 2015-10-20 | Discharge: 2015-10-20 | Disposition: A | Discharge status: Home / Self Care | Payer: BLUE CROSS/BLUE SHIELD | Source: Ambulatory Visit | Attending: Rheumatology | Admitting: Rheumatology

## 2015-10-20 ENCOUNTER — Encounter (HOSPITAL_COMMUNITY): Payer: Self-pay

## 2015-10-20 ENCOUNTER — Other Ambulatory Visit (HOSPITAL_COMMUNITY): Payer: Self-pay | Admitting: Rheumatology

## 2015-10-20 DIAGNOSIS — M329 Systemic lupus erythematosus, unspecified: Secondary | ICD-10-CM | POA: Diagnosis present

## 2015-10-20 MED ORDER — SODIUM CHLORIDE 0.9 % IV SOLN
Freq: Every day | INTRAVENOUS | Status: DC
  Administered 2015-10-20: 11:00:00 via INTRAVENOUS

## 2015-10-20 MED ORDER — BELIMUMAB 120 MG IV SOLR
10.0000 mg/kg | Freq: Once | INTRAVENOUS | Status: AC
  Administered 2015-10-20: 592 mg via INTRAVENOUS
  Filled 2015-10-20: qty 7.4

## 2015-10-20 NOTE — Progress Notes (Signed)
Uneventful 1st infusion of BENLYSTA over 1 hour with a 20 minute post infusion observation. Pt discharged ambulatory unaccompanied to elevator

## 2015-11-03 ENCOUNTER — Encounter (HOSPITAL_COMMUNITY)
Admission: RE | Admit: 2015-11-03 | Discharge: 2015-11-03 | Disposition: A | Discharge status: Home / Self Care | Payer: BLUE CROSS/BLUE SHIELD | Source: Ambulatory Visit | Attending: Rheumatology | Admitting: Rheumatology

## 2015-11-03 DIAGNOSIS — M329 Systemic lupus erythematosus, unspecified: Secondary | ICD-10-CM | POA: Insufficient documentation

## 2015-11-03 NOTE — Progress Notes (Signed)
First Benlysta infusion was 10/20/15.  Today would be pt's 2nd infusion.  While reviewing the [facility] checklist and asking about new or worsening symptoms since her last infusion pt states "Yes, I have had chest pain that comes on sudden and comes and goes over the last week. The chest pain is like someone standing on my chest. She states it lasts as long as a whole afternoon.  She said her shortness of breath is worse and she is using her PRN inhaler a lot more.  She said she feels more anxious and her hands are trembling more".  The symptoms are not occuring at the present.  But pt states they started about 1 week ago and Dr. Nickola MajorHawkes is unaware.  The last episode was about 2 days ago.  Informed pt I would have to call Dr.Hawkes office to see if we need to proceed with the infusion today.  Spoke with Roanna RaiderSherri, Dr.Hawkes nurse who ran all the info by Dr. Nickola MajorHawkes, herself and Dr. Nickola MajorHawkes states for pt to not be infused today and be seen by her PCP and then touch base with Dr. Nickola MajorHawkes office after that.  Then from there they would decide if and when pt will resume her Benlysta.  When this info was presented to the pt, she states she saw her PCP 2 days ago and he did some tests but pt could not explain what was done.  Informed pt to have her PCP send those results and findings to Dr. Nickola MajorHawkes.  Pt voiced understanding.  Explained to pt since these symptoms are new and ocurred since her last infusion it is unclear if it is related to the Benlysta infusion and needs to be evaluated.  Pt voiced understanding.  Pt was not infused today and was d/c ambulatory with parents.

## 2015-12-01 ENCOUNTER — Encounter (HOSPITAL_COMMUNITY): Payer: Self-pay

## 2015-12-01 ENCOUNTER — Encounter (HOSPITAL_COMMUNITY)
Admission: RE | Admit: 2015-12-01 | Discharge: 2015-12-01 | Disposition: A | Discharge status: Home / Self Care | Payer: BLUE CROSS/BLUE SHIELD | Source: Ambulatory Visit | Attending: Rheumatology | Admitting: Rheumatology

## 2015-12-01 DIAGNOSIS — M329 Systemic lupus erythematosus, unspecified: Secondary | ICD-10-CM | POA: Diagnosis not present

## 2015-12-01 HISTORY — DX: Other chest pain: R07.89

## 2015-12-01 HISTORY — DX: Reserved for inherently not codable concepts without codable children: IMO0001

## 2015-12-01 MED ORDER — SODIUM CHLORIDE 0.9 % IV SOLN
10.0000 mg/kg | Freq: Once | INTRAVENOUS | Status: AC
  Administered 2015-12-01: 592 mg via INTRAVENOUS
  Filled 2015-12-01: qty 7.4

## 2015-12-01 MED ORDER — SODIUM CHLORIDE 0.9 % IV SOLN
Freq: Every day | INTRAVENOUS | Status: DC
Start: 2015-12-01 — End: 2015-12-09
  Administered 2015-12-01: 13:00:00 via INTRAVENOUS

## 2015-12-01 NOTE — Progress Notes (Signed)
Pt received infusion in [facility], experienced difficulty in entering Pt on short stay computer log and assigning room in computer.  Pt care uneventful.

## 2015-12-01 NOTE — Progress Notes (Signed)
Denied further problems with chest discomfort after "changing inhalers" . Stated she did see her medical doctor after last visit and has not had further problems. Tolerated infusion of Benlysta today without complication.

## 2015-12-29 ENCOUNTER — Encounter (HOSPITAL_COMMUNITY)
Admission: RE | Admit: 2015-12-29 | Discharge: 2015-12-29 | Disposition: A | Discharge status: Home / Self Care | Payer: BLUE CROSS/BLUE SHIELD | Source: Ambulatory Visit | Attending: Rheumatology | Admitting: Rheumatology

## 2015-12-29 ENCOUNTER — Encounter (HOSPITAL_COMMUNITY): Payer: BLUE CROSS/BLUE SHIELD

## 2015-12-29 DIAGNOSIS — M329 Systemic lupus erythematosus, unspecified: Secondary | ICD-10-CM | POA: Diagnosis not present

## 2015-12-29 MED ORDER — BELIMUMAB 120 MG IV SOLR
10.0000 mg/kg | Freq: Once | INTRAVENOUS | Status: AC
  Administered 2015-12-29: 592 mg via INTRAVENOUS
  Filled 2015-12-29: qty 7.4

## 2015-12-29 MED ORDER — SODIUM CHLORIDE 0.9 % IV SOLN
INTRAVENOUS | Status: DC
  Administered 2015-12-29: 13:00:00 via INTRAVENOUS

## 2015-12-30 DIAGNOSIS — M329 Systemic lupus erythematosus, unspecified: Secondary | ICD-10-CM | POA: Diagnosis not present

## 2016-01-16 DIAGNOSIS — M2550 Pain in unspecified joint: Secondary | ICD-10-CM | POA: Diagnosis not present

## 2016-01-16 DIAGNOSIS — M329 Systemic lupus erythematosus, unspecified: Secondary | ICD-10-CM | POA: Diagnosis not present

## 2016-01-16 DIAGNOSIS — M064 Inflammatory polyarthropathy: Secondary | ICD-10-CM | POA: Diagnosis not present

## 2016-01-16 DIAGNOSIS — L932 Other local lupus erythematosus: Secondary | ICD-10-CM | POA: Diagnosis not present

## 2016-01-30 ENCOUNTER — Encounter (HOSPITAL_COMMUNITY)
Admission: RE | Admit: 2016-01-30 | Discharge: 2016-01-30 | Disposition: A | Discharge status: Home / Self Care | Payer: BLUE CROSS/BLUE SHIELD | Source: Ambulatory Visit | Attending: Rheumatology | Admitting: Rheumatology

## 2016-01-30 DIAGNOSIS — M329 Systemic lupus erythematosus, unspecified: Secondary | ICD-10-CM | POA: Insufficient documentation

## 2016-01-30 MED ORDER — SODIUM CHLORIDE 0.9 % IV SOLN
600.0000 mg | INTRAVENOUS | Status: DC
  Administered 2016-01-30: 600 mg via INTRAVENOUS
  Filled 2016-01-30: qty 7.5

## 2016-01-30 MED ORDER — SODIUM CHLORIDE 0.9 % IV SOLN
INTRAVENOUS | Status: DC
  Administered 2016-01-30: 14:00:00 via INTRAVENOUS

## 2016-01-30 NOTE — Progress Notes (Signed)
  January 30, 2016  Patient: Kelly [name]  Date of Birth: 24-Jul-1996  Date of Visit: 01/30/2016    To Whom It May Concern:  Kelly MuleJemariah Paterson was seen and treated in Short Stay for an infusion on 01/30/2016.  Return to work today  Sincerely,  Lamont SnowballJanet Cortavius Montesinos RN

## 2016-02-06 ENCOUNTER — Encounter (HOSPITAL_COMMUNITY): Payer: BLUE CROSS/BLUE SHIELD

## 2016-02-14 DIAGNOSIS — Z114 Encounter for screening for human immunodeficiency virus [HIV]: Secondary | ICD-10-CM | POA: Diagnosis not present

## 2016-02-14 DIAGNOSIS — Z113 Encounter for screening for infections with a predominantly sexual mode of transmission: Secondary | ICD-10-CM | POA: Diagnosis not present

## 2016-02-14 DIAGNOSIS — Z3009 Encounter for other general counseling and advice on contraception: Secondary | ICD-10-CM | POA: Diagnosis not present

## 2016-02-27 ENCOUNTER — Encounter (HOSPITAL_COMMUNITY): Admission: RE | Admit: 2016-02-27 | Payer: BLUE CROSS/BLUE SHIELD | Source: Ambulatory Visit

## 2016-03-02 DIAGNOSIS — Z30017 Encounter for initial prescription of implantable subdermal contraceptive: Secondary | ICD-10-CM | POA: Diagnosis not present

## 2016-03-02 DIAGNOSIS — Z3202 Encounter for pregnancy test, result negative: Secondary | ICD-10-CM | POA: Diagnosis not present

## 2016-03-13 ENCOUNTER — Encounter (HOSPITAL_COMMUNITY)
Admission: RE | Admit: 2016-03-13 | Discharge: 2016-03-13 | Disposition: A | Discharge status: Home / Self Care | Payer: BLUE CROSS/BLUE SHIELD | Source: Ambulatory Visit | Attending: Rheumatology | Admitting: Rheumatology

## 2016-03-13 ENCOUNTER — Encounter (HOSPITAL_COMMUNITY): Payer: Self-pay

## 2016-03-13 DIAGNOSIS — M2550 Pain in unspecified joint: Secondary | ICD-10-CM | POA: Diagnosis not present

## 2016-03-13 DIAGNOSIS — L932 Other local lupus erythematosus: Secondary | ICD-10-CM | POA: Diagnosis not present

## 2016-03-13 DIAGNOSIS — M329 Systemic lupus erythematosus, unspecified: Secondary | ICD-10-CM | POA: Insufficient documentation

## 2016-03-13 DIAGNOSIS — M064 Inflammatory polyarthropathy: Secondary | ICD-10-CM | POA: Diagnosis not present

## 2016-03-13 MED ORDER — BELIMUMAB 120 MG IV SOLR
600.0000 mg | INTRAVENOUS | Status: DC
  Administered 2016-03-13: 600 mg via INTRAVENOUS
  Filled 2016-03-13: qty 7.5

## 2016-03-13 MED ORDER — SODIUM CHLORIDE 0.9 % IV SOLN
INTRAVENOUS | Status: DC
  Administered 2016-03-13: 14:00:00 via INTRAVENOUS

## 2016-03-13 NOTE — Progress Notes (Signed)
Uneventful benlysta infusion.  Pt tolerated well.  Pt d/c ambulatory to lobby.

## 2016-03-21 DIAGNOSIS — Z118 Encounter for screening for other infectious and parasitic diseases: Secondary | ICD-10-CM | POA: Diagnosis not present

## 2016-03-21 DIAGNOSIS — N898 Other specified noninflammatory disorders of vagina: Secondary | ICD-10-CM | POA: Diagnosis not present

## 2016-03-21 DIAGNOSIS — R300 Dysuria: Secondary | ICD-10-CM | POA: Diagnosis not present

## 2016-03-21 DIAGNOSIS — Z113 Encounter for screening for infections with a predominantly sexual mode of transmission: Secondary | ICD-10-CM | POA: Diagnosis not present

## 2016-03-21 DIAGNOSIS — N760 Acute vaginitis: Secondary | ICD-10-CM | POA: Diagnosis not present

## 2016-03-26 ENCOUNTER — Encounter (HOSPITAL_COMMUNITY): Payer: BLUE CROSS/BLUE SHIELD

## 2016-04-02 DIAGNOSIS — N898 Other specified noninflammatory disorders of vagina: Secondary | ICD-10-CM | POA: Diagnosis not present

## 2016-04-02 DIAGNOSIS — N390 Urinary tract infection, site not specified: Secondary | ICD-10-CM | POA: Diagnosis not present

## 2016-04-06 ENCOUNTER — Encounter (HOSPITAL_COMMUNITY): Payer: Self-pay | Admitting: Emergency Medicine

## 2016-04-06 ENCOUNTER — Emergency Department (HOSPITAL_COMMUNITY): Payer: BLUE CROSS/BLUE SHIELD

## 2016-04-06 ENCOUNTER — Emergency Department (HOSPITAL_COMMUNITY)
Admission: EM | Admit: 2016-04-06 | Discharge: 2016-04-06 | Disposition: A | Discharge status: Home / Self Care | Payer: BLUE CROSS/BLUE SHIELD | Attending: Emergency Medicine | Admitting: Emergency Medicine

## 2016-04-06 DIAGNOSIS — F17200 Nicotine dependence, unspecified, uncomplicated: Secondary | ICD-10-CM | POA: Insufficient documentation

## 2016-04-06 DIAGNOSIS — R079 Chest pain, unspecified: Secondary | ICD-10-CM

## 2016-04-06 DIAGNOSIS — R0789 Other chest pain: Secondary | ICD-10-CM

## 2016-04-06 DIAGNOSIS — R071 Chest pain on breathing: Secondary | ICD-10-CM | POA: Diagnosis present

## 2016-04-06 LAB — I-STAT TROPONIN, ED
Troponin i, poc: 0 ng/mL (ref 0.00–0.08)
Troponin i, poc: 0 ng/mL (ref 0.00–0.08)

## 2016-04-06 LAB — BASIC METABOLIC PANEL
Anion gap: 10 (ref 5–15)
BUN: 10 mg/dL (ref 6–20)
CHLORIDE: 106 mmol/L (ref 101–111)
CO2: 24 mmol/L (ref 22–32)
CREATININE: 0.79 mg/dL (ref 0.44–1.00)
Calcium: 8.9 mg/dL (ref 8.9–10.3)
GFR calc Af Amer: >60 mL/min (ref >60)
GLUCOSE: 82 mg/dL (ref 65–99)
POTASSIUM: 3.3 mmol/L — AB (ref 3.5–5.1)
SODIUM: 140 mmol/L (ref 135–145)

## 2016-04-06 LAB — CBC
HEMATOCRIT: 35.8 % — AB (ref 36.0–46.0)
Hemoglobin: 12.1 g/dL (ref 12.0–15.0)
MCH: 28.4 pg (ref 26.0–34.0)
MCHC: 33.8 g/dL (ref 30.0–36.0)
MCV: 84 fL (ref 78.0–100.0)
PLATELETS: 231 10*3/uL (ref 150–400)
RBC: 4.26 MIL/uL (ref 3.87–5.11)
RDW: 14.4 % (ref 11.5–15.5)
WBC: 3.3 10*3/uL — AB (ref 4.0–10.5)

## 2016-04-06 LAB — D-DIMER, QUANTITATIVE: D-Dimer, Quant: 0.51 ug/mL-FEU — ABNORMAL HIGH (ref 0.00–0.50)

## 2016-04-06 MED ORDER — IOPAMIDOL (ISOVUE-370) INJECTION 76%
INTRAVENOUS | Status: AC
  Administered 2016-04-06: 80 mL
  Filled 2016-04-06: qty 100

## 2016-04-06 MED ORDER — NAPROXEN 500 MG PO TABS: 500.0000 mg | ORAL_TABLET | Freq: Two times a day (BID) | ORAL | 0 refills | Status: DC | PRN

## 2016-04-06 MED ORDER — POTASSIUM CHLORIDE CRYS ER 20 MEQ PO TBCR
40.0000 meq | EXTENDED_RELEASE_TABLET | Freq: Once | ORAL | Status: AC
  Administered 2016-04-06: 40 meq via ORAL
  Filled 2016-04-06: qty 2

## 2016-04-06 MED ORDER — HYDROCODONE-ACETAMINOPHEN 5-325 MG PO TABS: 1.0000 | ORAL_TABLET | Freq: Four times a day (QID) | ORAL | 0 refills | Status: DC | PRN

## 2016-04-06 NOTE — ED Notes (Signed)
Patient transported to CT 

## 2016-04-06 NOTE — ED Provider Notes (Signed)
[facility]EM[facility]ENCY DEPT Provider Note   CSN: [id number] Arrival date & time: 04/06/16  [id number]  First Provider Contact:  First MD Initiated Contact with Patient 04/06/16 [phone]      History   Chief Complaint Chief Complaint  Patient presents with  . Chest Pain    HPI Kelly [name] is a 20 y.o. female.  The history is provided by the patient and medical records. No language interpreter was used.    Kelly [name] is a 20 y.o. female  with a PMH of lupus who presents to the Emergency Department complaining of intermittent sharp non-radiating left-sided chest pain that began yesterday. Pain worse with bending over and deep breathing. Tylenol taken for pain with no relief. Denies shortness of breath, abdominal pain, n/v, fever, back pain. Of note, patient is on immunosuppressive therapy (CellCept). She is taking Plaquenil and prednisone as directed for lupus. No other complaints at this time.   Past Medical History:  Diagnosis Date  . Chest discomfort feb 2017   Pt states due to shortness of breath  . Lupus (systemic lupus erythematosus) (HCC)    dx 2010  . Shortness of breath dyspnea feb 2017   changed inhaler with relief    There are no active problems to display for this patient.   Past Surgical History:  Procedure Laterality Date  . WISDOM TOOTH EXTRACTION  2014    OB History    No data available       Home Medications    Prior to Admission medications   Medication Sig Start Date End Date Taking? Authorizing Provider  albuterol (PROVENTIL HFA;VENTOLIN HFA) 108 (90 BASE) MCG/ACT inhaler Inhale 1-2 puffs into the lungs every 6 (six) hours as needed for wheezing or shortness of breath.   Yes Historical Provider, MD  etonogestrel (NEXPLANON) 68 MG IMPL implant 1 each by Subdermal route once. Implanted in left upper arm.  Placed February 27, 2016   Yes Historical Provider, MD  hydroxychloroquine (PLAQUENIL) 200 MG tablet Take 1 tablet (200 mg total) by mouth daily. Patient  taking differently: Take 300 mg by mouth daily.  06/17/15  Yes Lorre NickAnthony Allen, MD  predniSONE (DELTASONE) 10 MG tablet Take 10 mg by mouth daily. 07/15/15  Yes Historical Provider, MD  valACYclovir (VALTREX) 1000 MG tablet Take 1,000 mg by mouth 2 (two) times daily.   Yes Historical Provider, MD  cephALEXin (KEFLEX) 250 MG capsule Take 1 capsule (250 mg total) by mouth 4 (four) times daily. Patient not taking: Reported on 04/06/2016 08/01/15   Loren Raceravid Yelverton, MD  HYDROcodone-acetaminophen [facility](NORCO) 5-325 MG tablet Take 1 tablet by mouth every 6 (six) hours as needed for moderate pain. 04/06/16   Chase PicketJaime Pilcher Ward, PA-C  naproxen (NAPROSYN) 500 MG tablet Take 1 tablet (500 mg total) by mouth 2 (two) times daily as needed. 04/06/16   Chase PicketJaime Pilcher Ward, PA-C  oxyCODONE-acetaminophen (PERCOCET/ROXICET) 5-325 MG tablet Take 1-2 tablets by mouth every 6 (six) hours as needed for severe pain. Patient not taking: Reported on 04/06/2016 06/17/15   Lorre NickAnthony Allen, MD  predniSONE (DELTASONE) 20 MG tablet Take 3 tablets (60 mg total) by mouth daily. Patient not taking: Reported on 04/06/2016 06/17/15   Lorre NickAnthony Allen, MD    Family History No family history on file.  Social History Social History  Substance Use Topics  . Smoking status: Current Every Day Smoker  . Smokeless tobacco: Never Used  . Alcohol use Yes     Allergies   Review of patient's allergies indicates no  known allergies.   Review of Systems Review of Systems  Constitutional: Negative for chills and fever.  HENT: Negative for congestion.   Eyes: Negative for visual disturbance.  Respiratory: Negative for cough and shortness of breath.   Cardiovascular: Positive for chest pain. Negative for palpitations and leg swelling.  Gastrointestinal: Negative for abdominal pain, nausea and vomiting.  Genitourinary: Negative for dysuria.  Musculoskeletal: Negative for back pain and neck pain.  Skin: Negative for rash.  Neurological: Negative for  headaches.     Physical Exam Updated Vital Signs BP 119/87   Pulse 83   Temp 98.8 F (37.1 C) (Oral)   Resp 15   Ht  (1.575 m)   Wt 58.1 kg   LMP 03/02/2016   SpO2 100%   BMI 23.45 kg/m   Physical Exam  Constitutional: She is oriented to person, place, and time. She appears well-developed and well-nourished. No distress.  HENT:  Head: Normocephalic and atraumatic.  Cardiovascular: Normal rate, regular rhythm, normal heart sounds and intact distal pulses.  Exam reveals no gallop and no friction rub.   No murmur heard. Pulmonary/Chest: Effort normal and breath sounds normal. No respiratory distress. She has no wheezes. She has no rales.  Speaking in full sentences, 100% O2 on room air. No tenderness to palpation of chest wall.   Abdominal: Soft. Bowel sounds are normal. She exhibits no distension. There is no tenderness.  Musculoskeletal: She exhibits no edema.  Neurological: She is alert and oriented to person, place, and time.  Skin: Skin is warm and dry.  Nursing note and vitals reviewed.    ED Treatments / Results  Labs (all labs ordered are listed, but only abnormal results are displayed) Labs Reviewed  BASIC METABOLIC PANEL - Abnormal; Notable for the following:       Result Value   Potassium 3.3 (*)    All other components within normal limits  CBC - Abnormal; Notable for the following:    WBC 3.3 (*)    HCT 35.8 (*)    All other components within normal limits  D-DIMER, QUANTITATIVE (NOT AT [facility]) - Abnormal; Notable for the following:    D-Dimer, Quant 0.51 (*)    All other components within normal limits  I-STAT TROPOININ, ED  I-STAT TROPOININ, ED    EKG  EKG Interpretation  Date/Time:  Friday April 06 2016 08:05:34 EDT Ventricular Rate:  88 PR Interval:  140 QRS Duration: 82 QT Interval:  366 QTC Calculation: 442 R Axis:   84 Text Interpretation:  Normal sinus rhythm Nonspecific T wave abnormality Abnormal ECG When compared to prior Deeper  TWI in lead III Similar TWI in V3 Confirmed by ISAACS MD, CAMERON [phone]) on 04/06/2016 10:06:59 AM       Radiology Dg Chest 2 View  Result Date: 04/06/2016 CLINICAL DATA:  20 year old female with a history of sharp left-sided chest pain EXAM: CHEST  2 VIEW COMPARISON:  08/01/2015 FINDINGS: The heart size and mediastinal contours are within normal limits. Both lungs are clear. The visualized skeletal structures are unremarkable. IMPRESSION: No active cardiopulmonary disease. Signed, Yvone Neu. Loreta Ave, DO [facility] Radiology Electronically Signed   By: Gilmer Mor D.O.   On: 04/06/2016 08:51   Ct Angio Chest Pe W Or Wo Contrast  Result Date: 04/06/2016 CLINICAL DATA:  Chest pain for 2 days.  No shortness of breath. EXAM: CT ANGIOGRAPHY CHEST WITH CONTRAST TECHNIQUE: Multidetector CT imaging of the chest was performed using the  standard protocol during bolus administration of intravenous contrast. Multiplanar CT image reconstructions and MIPs were obtained to evaluate the vascular anatomy. CONTRAST:  80 cc Isovue 370 COMPARISON:  04/06/2016 FINDINGS: Heart: Heart size is normal. No imaged pericardial effusion or significant coronary artery calcifications. Vascular structures: Pulmonary arteries are well opacified. There is no acute pulmonary embolus. Thoracic aorta has a normal early contrast appearance. Mediastinum/thyroid: The visualized portion of the thyroid gland has a normal appearance. No mediastinal, hilar, or axillary adenopathy. Lungs/Airways: No pulmonary nodules, pleural effusions, or infiltrates. Airways are patent. Upper abdomen: Unremarkable. Chest wall/osseous structures: Unremarkable. Review of the MIP images confirms the above findings. IMPRESSION: 1. Technically adequate exam showing no acute pulmonary embolus. 2. No significant coronary artery calcifications or pericardial effusion. 3. Lungs are clear. Electronically Signed   By:  Norva Pavlov M.D.   On: 04/06/2016 12:32    Procedures Procedures (including critical care time)  Medications Ordered in ED Medications  potassium chloride SA (K-DUR,KLOR-CON) CR tablet 40 mEq (40 mEq Oral Given 04/06/16 1350)  iopamidol (ISOVUE-370) 76 % injection (80 mLs  Contrast Given 04/06/16 1210)     Initial Impression / Assessment and Plan / ED Course  I have reviewed the triage vital signs and the nursing notes.  Pertinent labs & imaging results that were available during my care of the patient were reviewed by me and considered in my medical decision making (see chart for details).  Clinical Course   Oluwadamilola Deliz is a 20 y.o. female who presents to ED for chest pain. She has a hx of lupus on immunosuppressive therapy. Benign exam but does have nonspecific t-wave changes on EKG. Chest x-ray with no acute abnormalities. First troponin negative, CBC and BMP reviewed and reassuring. Hypokalemic at 3.3-replenished in ED. Given patient's history of lupus along with T-wave changes, d-dimer obtained which was elevated. CT angiogram obtained which was reassuring with no signs of PE. Repeat trop negative.   Patient is to be discharged with recommendation to follow up with PCP in regards to today's hospital visit. Patient's symptoms unlikely to be of CAD etiology. Labs and imaging reviewed again prior to dc. CXR with no acute abnormalities, CTangio, and neg troponins x2. Pt has been advised return to the ED if develop any exertional chest pain- strict return precautions discussed and all questions answered. Patient appears reliable for follow up and is agreeable to plan as dictated above.   Patient discussed with Dr. Erma Heritage who agrees with treatment plan.   Final Clinical Impressions(s) / ED Diagnoses   Final diagnoses:  Chest pain  Atypical chest pain    New Prescriptions New Prescriptions   HYDROCODONE-ACETAMINOPHEN (NORCO) 5-325 MG TABLET    Take 1 tablet by mouth every 6  (six) hours as needed for moderate pain.   NAPROXEN (NAPROSYN) 500 MG TABLET    Take 1 tablet (500 mg total) by mouth 2 (two) times daily as needed.        [facility] Ward, PA-C 04/06/16 1451    Shaune Pollack, MD 04/08/16 [phone]

## 2016-04-06 NOTE — ED Triage Notes (Signed)
Pt. Stated, I started getting chest pain yesterday and it feels lke its all over my chest.

## 2016-04-06 NOTE — Discharge Instructions (Signed)
Take naproxen as needed for mild to moderate pain. Take pain medication only as needed for severe pain - This can make you very drowsy - please do not drink alcohol, operate heavy machinery or drive on this medication.  Please follow up with your PCP in regards to today's visit. Return to ER for new or worsening symptoms, any additional concerns.

## 2016-04-10 ENCOUNTER — Encounter (HOSPITAL_COMMUNITY)
Admission: RE | Admit: 2016-04-10 | Payer: BLUE CROSS/BLUE SHIELD | Source: Ambulatory Visit | Attending: Rheumatology | Admitting: Rheumatology

## 2016-05-08 ENCOUNTER — Encounter (HOSPITAL_COMMUNITY)
Admission: RE | Admit: 2016-05-08 | Discharge: 2016-05-08 | Disposition: A | Discharge status: Home / Self Care | Payer: BLUE CROSS/BLUE SHIELD | Source: Ambulatory Visit | Attending: Rheumatology | Admitting: Rheumatology

## 2016-05-08 DIAGNOSIS — M329 Systemic lupus erythematosus, unspecified: Secondary | ICD-10-CM | POA: Insufficient documentation

## 2016-05-14 DIAGNOSIS — L932 Other local lupus erythematosus: Secondary | ICD-10-CM | POA: Diagnosis not present

## 2016-05-14 DIAGNOSIS — M2550 Pain in unspecified joint: Secondary | ICD-10-CM | POA: Diagnosis not present

## 2016-05-14 DIAGNOSIS — M329 Systemic lupus erythematosus, unspecified: Secondary | ICD-10-CM | POA: Diagnosis not present

## 2016-05-14 DIAGNOSIS — M064 Inflammatory polyarthropathy: Secondary | ICD-10-CM | POA: Diagnosis not present

## 2016-05-24 ENCOUNTER — Encounter (HOSPITAL_COMMUNITY): Payer: Self-pay

## 2016-05-24 ENCOUNTER — Encounter (HOSPITAL_COMMUNITY)
Admission: RE | Admit: 2016-05-24 | Discharge: 2016-05-24 | Disposition: A | Discharge status: Home / Self Care | Payer: BLUE CROSS/BLUE SHIELD | Source: Ambulatory Visit | Attending: Rheumatology | Admitting: Rheumatology

## 2016-05-24 DIAGNOSIS — M329 Systemic lupus erythematosus, unspecified: Secondary | ICD-10-CM | POA: Diagnosis not present

## 2016-05-24 MED ORDER — SODIUM CHLORIDE 0.9 % IV SOLN
INTRAVENOUS | Status: DC
  Administered 2016-05-24: 09:00:00 via INTRAVENOUS

## 2016-05-24 MED ORDER — SODIUM CHLORIDE 0.9 % IV SOLN
600.0000 mg | INTRAVENOUS | Status: DC
  Administered 2016-05-24: 600 mg via INTRAVENOUS
  Filled 2016-05-24: qty 7.5

## 2016-06-05 ENCOUNTER — Encounter (HOSPITAL_COMMUNITY): Payer: BLUE CROSS/BLUE SHIELD

## 2016-06-21 ENCOUNTER — Encounter (HOSPITAL_COMMUNITY): Payer: Self-pay

## 2016-06-21 ENCOUNTER — Encounter (HOSPITAL_COMMUNITY)
Admission: RE | Admit: 2016-06-21 | Discharge: 2016-06-21 | Disposition: A | Discharge status: Home / Self Care | Payer: BLUE CROSS/BLUE SHIELD | Source: Ambulatory Visit | Attending: Rheumatology | Admitting: Rheumatology

## 2016-06-21 DIAGNOSIS — M329 Systemic lupus erythematosus, unspecified: Secondary | ICD-10-CM | POA: Insufficient documentation

## 2016-06-21 MED ORDER — SODIUM CHLORIDE 0.9 % IV SOLN
INTRAVENOUS | Status: DC
  Administered 2016-06-21: 10:00:00 via INTRAVENOUS

## 2016-06-21 MED ORDER — BELIMUMAB 120 MG IV SOLR
600.0000 mg | INTRAVENOUS | Status: DC
  Administered 2016-06-21: 600 mg via INTRAVENOUS
  Filled 2016-06-21: qty 7.5

## 2016-06-21 NOTE — Progress Notes (Signed)
New rash "started 2 weeks ago" right mid back (approximately 6 inches diameter) reported to Dr Nickola MajorHawkes, and okay to infuse. Pt advised to notify MD if rash persists or worsens.  She verbalizes understanding.

## 2016-06-27 ENCOUNTER — Emergency Department (HOSPITAL_COMMUNITY)
Admission: EM | Admit: 2016-06-27 | Discharge: 2016-06-27 | Disposition: A | Discharge status: Home / Self Care | Payer: BLUE CROSS/BLUE SHIELD | Attending: Emergency Medicine | Admitting: Emergency Medicine

## 2016-06-27 ENCOUNTER — Encounter (HOSPITAL_COMMUNITY): Payer: Self-pay

## 2016-06-27 ENCOUNTER — Emergency Department (HOSPITAL_COMMUNITY): Payer: BLUE CROSS/BLUE SHIELD

## 2016-06-27 DIAGNOSIS — R05 Cough: Secondary | ICD-10-CM | POA: Diagnosis not present

## 2016-06-27 DIAGNOSIS — R0789 Other chest pain: Secondary | ICD-10-CM | POA: Insufficient documentation

## 2016-06-27 DIAGNOSIS — F17200 Nicotine dependence, unspecified, uncomplicated: Secondary | ICD-10-CM | POA: Insufficient documentation

## 2016-06-27 DIAGNOSIS — R0602 Shortness of breath: Secondary | ICD-10-CM | POA: Diagnosis not present

## 2016-06-27 DIAGNOSIS — R059 Cough, unspecified: Secondary | ICD-10-CM

## 2016-06-27 DIAGNOSIS — R071 Chest pain on breathing: Secondary | ICD-10-CM | POA: Diagnosis present

## 2016-06-27 DIAGNOSIS — R079 Chest pain, unspecified: Secondary | ICD-10-CM | POA: Diagnosis not present

## 2016-06-27 LAB — CBC WITH DIFFERENTIAL/PLATELET
BASOS ABS: 0 10*3/uL (ref 0.0–0.1)
Basophils Relative: 0 %
EOS PCT: 4 %
Eosinophils Absolute: 0.1 10*3/uL (ref 0.0–0.7)
HEMATOCRIT: 35.8 % — AB (ref 36.0–46.0)
HEMOGLOBIN: 12.3 g/dL (ref 12.0–15.0)
LYMPHS ABS: 0.9 10*3/uL (ref 0.7–4.0)
LYMPHS PCT: 27 %
MCH: 28.9 pg (ref 26.0–34.0)
MCHC: 34.4 g/dL (ref 30.0–36.0)
MCV: 84 fL (ref 78.0–100.0)
Monocytes Absolute: 0.3 10*3/uL (ref 0.1–1.0)
Monocytes Relative: 10 %
NEUTROS ABS: 1.8 10*3/uL (ref 1.7–7.7)
Neutrophils Relative %: 59 %
Platelets: 229 10*3/uL (ref 150–400)
RBC: 4.26 MIL/uL (ref 3.87–5.11)
RDW: 13.6 % (ref 11.5–15.5)
WBC: 3.1 10*3/uL — AB (ref 4.0–10.5)

## 2016-06-27 LAB — D-DIMER, QUANTITATIVE: D-Dimer, Quant: 0.56 ug/mL-FEU — ABNORMAL HIGH (ref 0.00–0.50)

## 2016-06-27 LAB — BASIC METABOLIC PANEL
ANION GAP: 9 (ref 5–15)
BUN: 8 mg/dL (ref 6–20)
CHLORIDE: 107 mmol/L (ref 101–111)
CO2: 23 mmol/L (ref 22–32)
Calcium: 8.9 mg/dL (ref 8.9–10.3)
Creatinine, Ser: 0.79 mg/dL (ref 0.44–1.00)
GFR calc Af Amer: >60 mL/min (ref >60)
GLUCOSE: 76 mg/dL (ref 65–99)
POTASSIUM: 3.2 mmol/L — AB (ref 3.5–5.1)
Sodium: 139 mmol/L (ref 135–145)

## 2016-06-27 MED ORDER — BENZONATATE 100 MG PO CAPS: 100.0000 mg | ORAL_CAPSULE | Freq: Three times a day (TID) | ORAL | 0 refills | Status: DC

## 2016-06-27 MED ORDER — ALBUTEROL SULFATE HFA 108 (90 BASE) MCG/ACT IN AERS
2.0000 | INHALATION_SPRAY | RESPIRATORY_TRACT | Status: DC
  Administered 2016-06-27: 2 via RESPIRATORY_TRACT
  Filled 2016-06-27: qty 6.7

## 2016-06-27 MED ORDER — IOPAMIDOL (ISOVUE-370) INJECTION 76%
INTRAVENOUS | Status: AC
  Administered 2016-06-27: 100 mL
  Filled 2016-06-27: qty 100

## 2016-06-27 MED ORDER — ALBUTEROL SULFATE HFA 108 (90 BASE) MCG/ACT IN AERS: 2.0000 | INHALATION_SPRAY | RESPIRATORY_TRACT | 0 refills | Status: DC | PRN

## 2016-06-27 NOTE — ED Provider Notes (Signed)
[facility] DEPT Provider Note   CSN: [id number] Arrival date & time: 06/27/16  0705     History   Chief Complaint Chief Complaint  Patient presents with  . Cough  . Chest Pain    HPI Kelly [name] is a 20 y.o. female.  HPI Kelly [name] is a 20 y.o. female with history of lupus, presents to emergency department complaining of left-sided chest pain. Patient states she has had left-sided chest pain for about a week that has worsened last night. She states pain shoots into the back. She reports pain worsens with deep breathing and coughing. She does report increased, nonproductive cough. She states that she has had similar symptoms in the past and was given an inhaler for them. She ran out of her inhaler yesterday and when she tried to refill it, they were unable to refill it for her. She does not know why but thinks maybe she had no refills left. She denies any wheezing. She denies any fever or chills. She denies any IV drug use. She is a smoker. She has Implanon for birth control. She denies history of PEs.  Past Medical History:  Diagnosis Date  . Chest discomfort feb 2017   Pt states due to shortness of breath  . Lupus (systemic lupus erythematosus) (HCC)    dx 2010  . Shortness of breath dyspnea feb 2017   changed inhaler with relief    There are no active problems to display for this patient.   Past Surgical History:  Procedure Laterality Date  . WISDOM TOOTH EXTRACTION  2014    OB History    No data available       Home Medications    Prior to Admission medications   Medication Sig Start Date End Date Taking? Authorizing Provider  albuterol (PROVENTIL HFA;VENTOLIN HFA) 108 (90 BASE) MCG/ACT inhaler Inhale 1-2 puffs into the lungs every 6 (six) hours as needed for wheezing or shortness of breath.    Historical Provider, MD  cephALEXin (KEFLEX) 250 MG capsule Take 1 capsule (250 mg total) by mouth 4 (four) times daily. Patient not taking: Reported on  04/06/2016 08/01/15   Loren Raceravid Yelverton, MD  etonogestrel (NEXPLANON) 68 MG IMPL implant 1 each by Subdermal route once. Implanted in left upper arm.  Placed February 27, 2016    Historical Provider, MD  HYDROcodone-acetaminophen (NORCO) 5-325 MG tablet Take 1 tablet by mouth every 6 (six) hours as needed for moderate pain. 04/06/16   Chase PicketJaime Pilcher Ward, PA-C  hydroxychloroquine (PLAQUENIL) 200 MG tablet Take 1 tablet (200 mg total) by mouth daily. Patient taking differently: Take 300 mg by mouth daily.  06/17/15   Lorre NickAnthony Allen, MD  naproxen (NAPROSYN) 500 MG tablet Take 1 tablet (500 mg total) by mouth 2 (two) times daily as needed. 04/06/16   Chase PicketJaime Pilcher Ward, PA-C  oxyCODONE-acetaminophen (PERCOCET/ROXICET) 5-325 MG tablet Take 1-2 tablets by mouth every 6 (six) hours as needed for severe pain. Patient not taking: Reported on 04/06/2016 06/17/15   Lorre NickAnthony Allen, MD  predniSONE (DELTASONE) 10 MG tablet Take 5 mg by mouth daily.  07/15/15   Historical Provider, MD  predniSONE (DELTASONE) 20 MG tablet Take 3 tablets (60 mg total) by mouth daily. 06/17/15   Lorre NickAnthony Allen, MD  valACYclovir (VALTREX) 1000 MG tablet Take 1,000 mg by mouth 2 (two) times daily.    Historical Provider, MD    Family History No family history on file.  Social History Social History  Substance Use Topics  .  Smoking status: Current Every Day Smoker  . Smokeless tobacco: Never Used  . Alcohol use Yes     Allergies   Review of patient's allergies indicates no known allergies.   Review of Systems Review of Systems  Constitutional: Negative for chills and fever.  Respiratory: Positive for cough, chest tightness and shortness of breath.   Cardiovascular: Positive for chest pain. Negative for palpitations and leg swelling.  Gastrointestinal: Negative for abdominal pain, diarrhea, nausea and vomiting.  Genitourinary: Negative for dysuria, flank pain, pelvic pain, vaginal bleeding, vaginal discharge and vaginal pain.    Musculoskeletal: Negative for arthralgias, myalgias, neck pain and neck stiffness.  Skin: Negative for rash.  Neurological: Negative for dizziness, weakness and headaches.  All other systems reviewed and are negative.    Physical Exam Updated Vital Signs BP 118/80   Pulse 100   Temp 98.5 F (36.9 C) (Oral)   Resp 18   SpO2 100%   Physical Exam  Constitutional: She appears well-developed and well-nourished. No distress.  HENT:  Head: Normocephalic.  Eyes: Conjunctivae are normal.  Neck: Neck supple.  Cardiovascular: Normal rate, regular rhythm and normal heart sounds.   Pulmonary/Chest: Effort normal and breath sounds normal. No respiratory distress. She has no wheezes. She has no rales. She exhibits no tenderness.  Abdominal: Soft. Bowel sounds are normal. She exhibits no distension. There is no tenderness. There is no rebound.  Musculoskeletal: She exhibits no edema.  Neurological: She is alert.  Skin: Skin is warm and dry.  Psychiatric: She has a normal mood and affect. Her behavior is normal.  Nursing note and vitals reviewed.    ED Treatments / Results  Labs (all labs ordered are listed, but only abnormal results are displayed) Labs Reviewed  CBC WITH DIFFERENTIAL/PLATELET - Abnormal; Notable for the following:       Result Value   WBC 3.1 (*)    HCT 35.8 (*)    All other components within normal limits  BASIC METABOLIC PANEL - Abnormal; Notable for the following:    Potassium 3.2 (*)    All other components within normal limits  D-DIMER, QUANTITATIVE (NOT AT [facility]) - Abnormal; Notable for the following:    D-Dimer, Quant 0.56 (*)    All other components within normal limits  POC URINE PREG, ED    EKG  EKG Interpretation None       Radiology Dg Chest 2 View  Result Date: 06/27/2016 CLINICAL DATA:  Chest pain and cough EXAM: CHEST  2 VIEW COMPARISON:  04/06/2016 FINDINGS: The heart size and mediastinal contours are within normal limits. Both lungs are  clear. The visualized skeletal structures are unremarkable. IMPRESSION: No active cardiopulmonary disease. Electronically Signed   By: Marlan Palau M.D.   On: 06/27/2016 08:26   Ct Angio Chest Pe W And/or Wo Contrast  Result Date: 06/27/2016 CLINICAL DATA:  Mid chest pain for 2 weeks with shortness of Breath EXAM: CT ANGIOGRAPHY CHEST WITH CONTRAST TECHNIQUE: Multidetector CT imaging of the chest was performed using the standard protocol during bolus administration of intravenous contrast. Multiplanar CT image reconstructions and MIPs were obtained to evaluate the vascular anatomy. CONTRAST:  100 mL Isovue 370. COMPARISON:  04/06/2016 FINDINGS: Cardiovascular: The thoracic aorta is not well visualized due to the timing of the contrast bolus. No definitive aneurysmal dilatation is seen. The pulmonary artery demonstrates a normal branching pattern. No intraluminal filling defect to suggest pulmonary embolism is identified. No evidence of right heart strain is seen. Mediastinum/Nodes: No  significant hilar or mediastinal adenopathy is noted. Thoracic inlet is within normal limits. Mildly bulky axillary lymph nodes are noted on the leftbut stable from the prior exam and likely benign in etiology. Lungs/Pleura: No focal infiltrate or sizable effusion is noted. No parenchymal nodules are seen. Upper Abdomen: No acute abnormality. Musculoskeletal: No acute abnormality noted. Review of the MIP images confirms the above findings. IMPRESSION: No evidence of pulmonary embolism. No acute abnormality seen. Electronically Signed   By: Alcide CleverMark  Lukens M.D.   On: 06/27/2016 10:22    Procedures Procedures (including critical care time)  Medications Ordered in ED Medications - No data to display   Initial Impression / Assessment and Plan / ED Course  I have reviewed the triage vital signs and the nursing notes.  Pertinent labs & imaging results that were available during my care of the patient were reviewed by me and  considered in my medical decision making (see chart for details).  Clinical Course   Patient in emergency department with pleuritic and exertional chest pain or shortness of breath. Lungs are clear. The vital signs are normal other than tachycardia, heart rate is 100. Patient does have risk factors for PE, including lupus and being a smoker. We will get a d-dimer. She had a negative CT angiogram in August 2017. Doubt ACS, atypical pain, low risk. Will get CXR. Basic labs.   9:43 AM D dimer mildly elevated at 0.56. Pt certainly at risk of having a PE. Will get CT angio. Otherwise labs unremarkable except for potassium of 3.2. CXR negative.  10:33 AM CT angio negative for PE. Pt in NAD. VS normal. Asking for inhaler. One given in ED. Will also write a prescription for one. Suspect musculoskeletal pain from coughing. Will give tessalon perles for cough. Instructed to stop smoking. Home with pcp follow up   Final Clinical Impressions(s) / ED Diagnoses   Final diagnoses:  Cough  Chest wall pain    New Prescriptions New Prescriptions   ALBUTEROL (PROVENTIL HFA;VENTOLIN HFA) 108 (90 BASE) MCG/ACT INHALER    Inhale 2 puffs into the lungs every 4 (four) hours as needed for wheezing or shortness of breath.   BENZONATATE (TESSALON) 100 MG CAPSULE    Take 1 capsule (100 mg total) by mouth every 8 (eight) hours.     Jaynie Crumbleatyana Axyl Sitzman, PA-C 06/27/16 1037    Loren Raceravid Yelverton, MD 06/29/16 [phone]

## 2016-06-27 NOTE — ED Notes (Signed)
Pt taken to xray 

## 2016-06-27 NOTE — ED Notes (Signed)
Pt taken to CT.

## 2016-06-27 NOTE — Discharge Instructions (Signed)
Continue inhaler, 2 puffs every 4 hours as needed. Take Tylenol or Motrin for pain. Take Tessalon as prescribed as needed for cough. Please follow-up with your family doctor. Today a chest x-ray and CT scan did not show any abnormalities. Blood work unremarkable.

## 2016-06-27 NOTE — ED Triage Notes (Signed)
Patient complains of chest pain with inspiration. Has had cough and congestion and out of inhaler that normally resolves this. No distress, alert and oriented

## 2016-07-13 DIAGNOSIS — Z79899 Other long term (current) drug therapy: Secondary | ICD-10-CM | POA: Diagnosis not present

## 2016-07-13 DIAGNOSIS — Z23 Encounter for immunization: Secondary | ICD-10-CM | POA: Diagnosis not present

## 2016-07-13 DIAGNOSIS — M1990 Unspecified osteoarthritis, unspecified site: Secondary | ICD-10-CM | POA: Diagnosis not present

## 2016-07-13 DIAGNOSIS — M329 Systemic lupus erythematosus, unspecified: Secondary | ICD-10-CM | POA: Diagnosis not present

## 2016-07-13 DIAGNOSIS — R21 Rash and other nonspecific skin eruption: Secondary | ICD-10-CM | POA: Diagnosis not present

## 2016-07-18 ENCOUNTER — Encounter (HOSPITAL_COMMUNITY): Payer: Self-pay

## 2016-07-18 ENCOUNTER — Emergency Department (HOSPITAL_COMMUNITY): Payer: BLUE CROSS/BLUE SHIELD

## 2016-07-18 ENCOUNTER — Emergency Department (HOSPITAL_COMMUNITY)
Admission: EM | Admit: 2016-07-18 | Discharge: 2016-07-18 | Disposition: A | Discharge status: Home / Self Care | Payer: BLUE CROSS/BLUE SHIELD | Attending: Emergency Medicine | Admitting: Emergency Medicine

## 2016-07-18 DIAGNOSIS — F17200 Nicotine dependence, unspecified, uncomplicated: Secondary | ICD-10-CM | POA: Diagnosis not present

## 2016-07-18 DIAGNOSIS — R1030 Lower abdominal pain, unspecified: Secondary | ICD-10-CM | POA: Diagnosis not present

## 2016-07-18 DIAGNOSIS — N83201 Unspecified ovarian cyst, right side: Secondary | ICD-10-CM

## 2016-07-18 DIAGNOSIS — N898 Other specified noninflammatory disorders of vagina: Secondary | ICD-10-CM | POA: Insufficient documentation

## 2016-07-18 DIAGNOSIS — R52 Pain, unspecified: Secondary | ICD-10-CM

## 2016-07-18 LAB — URINALYSIS, ROUTINE W REFLEX MICROSCOPIC
Bilirubin Urine: NEGATIVE
Glucose, UA: NEGATIVE mg/dL
Hgb urine dipstick: NEGATIVE
KETONES UR: NEGATIVE mg/dL
LEUKOCYTES UA: NEGATIVE
NITRITE: NEGATIVE
PROTEIN: 30 mg/dL — AB
Specific Gravity, Urine: 1.025 (ref 1.005–1.030)
pH: 6.5 (ref 5.0–8.0)

## 2016-07-18 LAB — CBC
HEMATOCRIT: 35.2 % — AB (ref 36.0–46.0)
Hemoglobin: 12.1 g/dL (ref 12.0–15.0)
MCH: 28.9 pg (ref 26.0–34.0)
MCHC: 34.4 g/dL (ref 30.0–36.0)
MCV: 84 fL (ref 78.0–100.0)
PLATELETS: 258 10*3/uL (ref 150–400)
RBC: 4.19 MIL/uL (ref 3.87–5.11)
RDW: 13.3 % (ref 11.5–15.5)
WBC: 5.2 10*3/uL (ref 4.0–10.5)

## 2016-07-18 LAB — GC/CHLAMYDIA PROBE AMP (~~LOC~~) NOT AT ARMC
Chlamydia: NEGATIVE
Neisseria Gonorrhea: NEGATIVE

## 2016-07-18 LAB — WET PREP, GENITAL
Sperm: NONE SEEN
Trich, Wet Prep: NONE SEEN
YEAST WET PREP: NONE SEEN

## 2016-07-18 LAB — COMPREHENSIVE METABOLIC PANEL
ALT: 13 U/L — ABNORMAL LOW (ref 14–54)
AST: 20 U/L (ref 15–41)
Albumin: 3.6 g/dL (ref 3.5–5.0)
Alkaline Phosphatase: 39 U/L (ref 38–126)
Anion gap: 8 (ref 5–15)
BILIRUBIN TOTAL: 0.4 mg/dL (ref 0.3–1.2)
BUN: 9 mg/dL (ref 6–20)
CO2: 25 mmol/L (ref 22–32)
CREATININE: 0.74 mg/dL (ref 0.44–1.00)
Calcium: 9.1 mg/dL (ref 8.9–10.3)
Chloride: 107 mmol/L (ref 101–111)
Glucose, Bld: 94 mg/dL (ref 65–99)
POTASSIUM: 3.6 mmol/L (ref 3.5–5.1)
Sodium: 140 mmol/L (ref 135–145)
TOTAL PROTEIN: 7.6 g/dL (ref 6.5–8.1)

## 2016-07-18 LAB — URINE MICROSCOPIC-ADD ON

## 2016-07-18 LAB — HIV ANTIBODY (ROUTINE TESTING W REFLEX): HIV SCREEN 4TH GENERATION: NONREACTIVE

## 2016-07-18 LAB — HCG, QUANTITATIVE, PREGNANCY: hCG, Beta Chain, Quant, S: <1 m[IU]/mL (ref <5)

## 2016-07-18 LAB — RPR: RPR Ser Ql: NONREACTIVE

## 2016-07-18 LAB — LIPASE, BLOOD: Lipase: 35 U/L (ref 11–51)

## 2016-07-18 LAB — POC URINE PREG, ED: Preg Test, Ur: NEGATIVE

## 2016-07-18 MED ORDER — HYDROCODONE-ACETAMINOPHEN 5-325 MG PO TABS: 2.0000 | ORAL_TABLET | ORAL | 0 refills | Status: DC | PRN

## 2016-07-18 MED ORDER — MORPHINE SULFATE (PF) 4 MG/ML IV SOLN
4.0000 mg | Freq: Once | INTRAVENOUS | Status: AC
  Administered 2016-07-18: 4 mg via INTRAVENOUS
  Filled 2016-07-18: qty 1

## 2016-07-18 MED ORDER — ONDANSETRON HCL 4 MG/2ML IJ SOLN
4.0000 mg | Freq: Once | INTRAMUSCULAR | Status: AC
  Administered 2016-07-18: 4 mg via INTRAVENOUS
  Filled 2016-07-18: qty 2

## 2016-07-18 NOTE — ED Notes (Signed)
Patient transported to Ultrasound 

## 2016-07-18 NOTE — ED Triage Notes (Signed)
Pt states yesterday she started having lower abdominal pains with n/v x 6 hours; pt states pain is achy and sharp; pt states pain at 10/10 on arrival. Pt a&ox 4 on arrival.

## 2016-07-18 NOTE — ED Provider Notes (Signed)
[facility]-EMERGENCY DEPT Provider Note   CSN: [id number] Arrival date & time: 07/18/16  0120     History   Chief Complaint Chief Complaint  Patient presents with  . Abdominal Pain  . Vomiting    HPI  Blood pressure 132/93, pulse 90, temperature 98.7 F (37.1 C), temperature source Oral, resp. rate 19, last menstrual period 04/02/2016, SpO2 100 %.  Brenlyn Beshara is a 20 y.o. female with past medical history significant for lupus complaining of acute onset of severe lower abdominal pain that was then followed by 2 episodes of nonbloody, nonbilious, non-coffee ground emesis, pain is severe and preventing her from sleep at night, she rates it a 10 out of 10, no pain medication taken prior to arrival. She denies fever, chills, change in bowel or bladder habits, abnormal vaginal discharge.  Past Medical History:  Diagnosis Date  . Chest discomfort feb 2017   Pt states due to shortness of breath  . Lupus (systemic lupus erythematosus) (HCC)    dx 2010  . Shortness of breath dyspnea feb 2017   changed inhaler with relief    There are no active problems to display for this patient.   Past Surgical History:  Procedure Laterality Date  . WISDOM TOOTH EXTRACTION  2014    OB History    No data available       Home Medications    Prior to Admission medications   Medication Sig Start Date End Date Taking? Authorizing Provider  acetaminophen (TYLENOL) 500 MG tablet Take 1,000 mg by mouth every 6 (six) hours as needed for moderate pain or headache.    Historical Provider, MD  albuterol (PROVENTIL HFA;VENTOLIN HFA) 108 (90 BASE) MCG/ACT inhaler Inhale 2 puffs into the lungs every 6 (six) hours as needed for wheezing or shortness of breath.     Historical Provider, MD  albuterol (PROVENTIL HFA;VENTOLIN HFA) 108 (90 Base) MCG/ACT inhaler Inhale 2 puffs into the lungs every 4 (four) hours as needed for wheezing or shortness of breath. 06/27/16   Tatyana Kirichenko, PA-C  benzonatate  (TESSALON) 100 MG capsule Take 1 capsule (100 mg total) by mouth every 8 (eight) hours. 06/27/16   Tatyana Kirichenko, PA-C  cephALEXin (KEFLEX) 250 MG capsule Take 1 capsule (250 mg total) by mouth 4 (four) times daily. Patient not taking: Reported on 06/27/2016 08/01/15   Loren Racer, MD  etonogestrel (NEXPLANON) 68 MG IMPL implant 1 each by Subdermal route once. Implanted in left upper arm.  Placed February 27, 2016    Historical Provider, MD  HYDROcodone-acetaminophen (NORCO) 5-325 MG tablet Take 1 tablet by mouth every 6 (six) hours as needed for moderate pain. Patient not taking: Reported on 06/27/2016 04/06/16   [facility] Ward, PA-C  hydroxychloroquine (PLAQUENIL) 200 MG tablet Take 1 tablet (200 mg total) by mouth daily. Patient taking differently: Take 300 mg by mouth daily.  06/17/15   Lorre Nick, MD  naproxen (NAPROSYN) 500 MG tablet Take 1 tablet (500 mg total) by mouth 2 (two) times daily as needed. Patient not taking: Reported on 06/27/2016 04/06/16   [facility] Ward, PA-C  oxyCODONE-acetaminophen (PERCOCET/ROXICET) 5-325 MG tablet Take 1-2 tablets by mouth every 6 (six) hours as needed for severe pain. Patient not taking: Reported on 06/27/2016 06/17/15   Lorre Nick, MD  predniSONE (DELTASONE) 20 MG tablet Take 3 tablets (60 mg total) by mouth daily. Patient not taking: Reported on 06/27/2016 06/17/15   Lorre Nick, MD  predniSONE (DELTASONE) 5 MG tablet Take 5 mg  by mouth daily.  07/15/15   Historical Provider, MD    Family History No family history on file.  Social History Social History  Substance Use Topics  . Smoking status: Current Every Day Smoker  . Smokeless tobacco: Never Used  . Alcohol use Yes     Allergies   Patient has no known allergies.   Review of Systems Review of Systems  10 systems reviewed and found to be negative, except as noted in the HPI.   Physical Exam Updated Vital Signs BP 132/93 (BP Location: Left Arm)   Pulse 90   Temp 98.7  F (37.1 C) (Oral)   Resp 19   LMP 04/02/2016   SpO2 100%   Physical Exam  Constitutional: She is oriented to person, place, and time. She appears well-developed and well-nourished. No distress.  HENT:  Head: Normocephalic and atraumatic.  Mouth/Throat: Oropharynx is clear and moist.  Eyes: Conjunctivae and EOM are normal. Pupils are equal, round, and reactive to light.  Neck: Normal range of motion.  Cardiovascular: Normal rate, regular rhythm and intact distal pulses.   Pulmonary/Chest: Effort normal and breath sounds normal.  Abdominal: Soft. She exhibits no distension and no mass. There is tenderness. There is no rebound and no guarding. No hernia.  Bilateral lower abdominal pain, no lateralization. Normoactive bowel sounds, no guarding or rebound.  Genitourinary:  Genitourinary Comments: Pelvic exam with no rash or lesion, no cervical motion or left adnexal tenderness she is minimally tender on the right side.  Musculoskeletal: Normal range of motion.  Neurological: She is alert and oriented to person, place, and time.  Skin: She is not diaphoretic.  Psychiatric: She has a normal mood and affect.  Nursing note and vitals reviewed.    ED Treatments / Results  Labs (all labs ordered are listed, but only abnormal results are displayed) Labs Reviewed  COMPREHENSIVE METABOLIC PANEL - Abnormal; Notable for the following:       Result Value   ALT 13 (*)    All other components within normal limits  CBC - Abnormal; Notable for the following:    HCT 35.2 (*)    All other components within normal limits  URINALYSIS, ROUTINE W REFLEX MICROSCOPIC (NOT AT [facility]) - Abnormal; Notable for the following:    Color, Urine AMBER (*)    Protein, ur 30 (*)    All other components within normal limits  URINE MICROSCOPIC-ADD ON - Abnormal; Notable for the following:    Squamous Epithelial / LPF 6-30 (*)    Bacteria, UA FEW (*)    All other components within normal limits  WET PREP, GENITAL    LIPASE, BLOOD  RPR  HIV ANTIBODY (ROUTINE TESTING)  POC URINE PREG, ED  GC/CHLAMYDIA PROBE AMP ([place]) NOT AT [facility]    EKG  EKG Interpretation None       Radiology No results found.  Procedures Procedures (including critical care time)  Medications Ordered in ED Medications  morphine 4 MG/ML injection 4 mg (not administered)  ondansetron (ZOFRAN) injection 4 mg (not administered)     Initial Impression / Assessment and Plan / ED Course  I have reviewed the triage vital signs and the nursing notes.  Pertinent labs & imaging results that were available during my care of the patient were reviewed by me and considered in my medical decision making (see chart for details).  Clinical Course     Vitals:   07/18/16 0132 07/18/16 0407  BP: 118/87 132/93  Pulse: 95 90  Resp: 16 19  Temp: 98 F (36.7 C) 98.7 F (37.1 C)  TempSrc: Oral Oral  SpO2: 100% 100%    Medications  morphine 4 MG/ML injection 4 mg (not administered)  ondansetron (ZOFRAN) injection 4 mg (not administered)    Thornell MuleJemariah Klindt is 20 y.o. female presenting with Acute lower abdominal pain followed by multiple episodes of emesis, abdominal exam is nonsurgical, based on the severity of her pain and the acuity of onset consider torsion. Pelvic exam pending ultrasound.  Ultrasound with right sided assistant daughter cysts, they recommend obtaining a quantitative hCG to ensure that this is not an ectopic. Pelvic exam with very mild right-sided adnexal tenderness. Pain well-controlled with morphine. Advised patient to remain nothing by mouth. Wet prep with clue cells, she's not reporting any vaginal discharge. No indication for treatment of bacterial vaginosis.  I doubt this is an appendicitis based on the acuity and severity of her symptoms. She also has no white count.  Case signed out to PA Hedges at shift change: Plan is to follow-up hCG and if negative will discharge to home with pain  medication.  Evaluation does not show pathology that would require ongoing emergent intervention or inpatient treatment. Pt is hemodynamically stable and mentating appropriately. Discussed findings and plan with patient/guardian, who agrees with care plan. All questions answered. Return precautions discussed and outpatient follow up given.      Final Clinical Impressions(s) / ED Diagnoses   Final diagnoses:  None    New Prescriptions New Prescriptions   No medications on file     Wynetta Emeryicole Caleigh Rabelo, PA-C 07/18/16 [phone]    Glynn OctaveStephen Rancour, MD 07/18/16 [phone]

## 2016-07-18 NOTE — ED Provider Notes (Signed)
  Physical Exam  BP 114/77   Pulse 82   Temp 98.7 F (37.1 C) (Oral)   Resp 19   LMP 04/02/2016   SpO2 100%   Physical Exam   - No signs of distress, resting comfortably in exam bed  Tenderness to palpation of the right pelvis, no significant tenderness at McBurney's point or remainder of abdomen  ED Course  Procedures  MDM   20 year old female signed to me at shift change pending Quan hCG, reassessment and disposition. Please see previous provider's note for full H&P. Patient presents with right lower pelvic pain. Patient had an ultrasound performed that showed likely an ovarian cyst but recommended follow-up Cloretta NedQuan hCG to ensure this is not an ectopic pregnancy. HCG Quant returned showing negative pregnancy. Upon my assessment patient does have right pelvic pain, she has no significant abdominal pain that would indicate an appendicitis or significant intra-abdominal pathology. Patient is afebrile, she has no elevation or WBCs. Patient was discharged home with oral pain medication for likely ovarian cyst, and follow-up with OB/GYN for further evaluation and management. She is given strict return precautions, she verbalized understanding and agreement to today's plan had no further questions or concerns at this time discharge.        Eyvonne MechanicJeffrey Sumayya Muha, PA-C 07/18/16 [id number]    Glynn OctaveStephen Rancour, MD 07/18/16 [phone]

## 2016-07-18 NOTE — Discharge Instructions (Signed)
Please read attached information. If you experience any new or worsening signs or symptoms please return to the emergency room for evaluation. Please follow-up with your primary care provider or specialist as discussed. Please use medication prescribed only as directed and discontinue taking if you have any concerning signs or symptoms.   °

## 2016-07-23 DIAGNOSIS — N83209 Unspecified ovarian cyst, unspecified side: Secondary | ICD-10-CM | POA: Diagnosis not present

## 2016-07-24 ENCOUNTER — Encounter (HOSPITAL_COMMUNITY): Admission: RE | Admit: 2016-07-24 | Payer: BLUE CROSS/BLUE SHIELD | Source: Ambulatory Visit

## 2016-08-13 DIAGNOSIS — L932 Other local lupus erythematosus: Secondary | ICD-10-CM | POA: Diagnosis not present

## 2016-08-13 DIAGNOSIS — M064 Inflammatory polyarthropathy: Secondary | ICD-10-CM | POA: Diagnosis not present

## 2016-08-13 DIAGNOSIS — M329 Systemic lupus erythematosus, unspecified: Secondary | ICD-10-CM | POA: Diagnosis not present

## 2016-08-13 DIAGNOSIS — M2550 Pain in unspecified joint: Secondary | ICD-10-CM | POA: Diagnosis not present

## 2016-08-15 ENCOUNTER — Ambulatory Visit (HOSPITAL_COMMUNITY)
Admission: RE | Admit: 2016-08-15 | Discharge: 2016-08-15 | Disposition: A | Discharge status: Home / Self Care | Payer: BLUE CROSS/BLUE SHIELD | Source: Ambulatory Visit | Attending: Family Medicine | Admitting: Family Medicine

## 2016-08-15 ENCOUNTER — Encounter (HOSPITAL_COMMUNITY): Payer: Self-pay

## 2016-08-15 DIAGNOSIS — Z5111 Encounter for antineoplastic chemotherapy: Secondary | ICD-10-CM | POA: Insufficient documentation

## 2016-08-15 DIAGNOSIS — M329 Systemic lupus erythematosus, unspecified: Secondary | ICD-10-CM | POA: Diagnosis not present

## 2016-08-15 MED ORDER — SODIUM CHLORIDE 0.9 % IV SOLN
INTRAVENOUS | Status: DC
  Administered 2016-08-15: 09:00:00 via INTRAVENOUS

## 2016-08-15 MED ORDER — BELIMUMAB 120 MG IV SOLR
10.0000 mg/kg | INTRAVENOUS | Status: DC
  Administered 2016-08-15: 576 mg via INTRAVENOUS
  Filled 2016-08-15: qty 7.2

## 2016-08-24 ENCOUNTER — Encounter (HOSPITAL_COMMUNITY): Payer: BLUE CROSS/BLUE SHIELD

## 2016-09-12 ENCOUNTER — Ambulatory Visit (HOSPITAL_COMMUNITY): Admission: RE | Admit: 2016-09-12 | Payer: BLUE CROSS/BLUE SHIELD | Source: Ambulatory Visit

## 2016-10-04 ENCOUNTER — Ambulatory Visit (INDEPENDENT_AMBULATORY_CARE_PROVIDER_SITE_OTHER): Payer: BLUE CROSS/BLUE SHIELD | Admitting: Family Medicine

## 2016-10-04 VITALS — BP 108/68 | HR 86 | Temp 98.5°F | Ht 61.0 in | Wt 129.6 lb

## 2016-10-04 DIAGNOSIS — M62838 Other muscle spasm: Secondary | ICD-10-CM | POA: Diagnosis not present

## 2016-10-04 DIAGNOSIS — M5430 Sciatica, unspecified side: Secondary | ICD-10-CM | POA: Diagnosis not present

## 2016-10-04 DIAGNOSIS — E538 Deficiency of other specified B group vitamins: Secondary | ICD-10-CM

## 2016-10-04 DIAGNOSIS — M79604 Pain in right leg: Secondary | ICD-10-CM

## 2016-10-04 DIAGNOSIS — M79605 Pain in left leg: Secondary | ICD-10-CM

## 2016-10-04 LAB — POCT URINALYSIS DIP (MANUAL ENTRY)
BILIRUBIN UA: NEGATIVE
GLUCOSE UA: NEGATIVE
Leukocytes, UA: NEGATIVE
Nitrite, UA: NEGATIVE
UROBILINOGEN UA: 0.2
pH, UA: 6.5

## 2016-10-04 LAB — POC MICROSCOPIC URINALYSIS (UMFC)

## 2016-10-04 LAB — POCT URINE PREGNANCY: Preg Test, Ur: NEGATIVE

## 2016-10-04 MED ORDER — NAPROXEN 500 MG PO TABS: 500.0000 mg | ORAL_TABLET | Freq: Two times a day (BID) | ORAL | 0 refills | Status: DC | PRN

## 2016-10-04 MED ORDER — CYCLOBENZAPRINE HCL 10 MG PO TABS: 10.0000 mg | ORAL_TABLET | Freq: Three times a day (TID) | ORAL | 0 refills | Status: DC | PRN

## 2016-10-04 NOTE — Patient Instructions (Addendum)
Start Naproxen 500 mg every 12 hours as needed for muscle aches.  Take Cyclobenzaprine 10 mg at bedtime for muscle spasms.  Someone from our office will contact you to notify you of your lab results.    IF you received an x-ray today, you will receive an invoice from [facility]. Please contact [facility] at [phone] with questions or concerns regarding your invoice.   IF you received labwork today, you will receive an invoice from [facility]. Please contact [facility] at [phone] with questions or concerns regarding your invoice.   Our billing staff will not be able to assist you with questions regarding bills from these companies.  You will be contacted with the lab results as soon as they are available. The fastest way to get your results is to activate your My Chart account. Instructions are located on the last page of this paperwork. If you have not heard from us regarding the results in 2 weeks, please contact this office.     Dystonic Reaction Introduction Dystonia is a condition that makes your muscles contract without warning (muscle spasms). It can cause unwanted, uncomfortable jerking of muscle groups. This condition is rarely life threatening. What are the causes? A dystonic reaction is most often a side effect of a particular medicine.These reactions occur when the normal patterns of the nerve receptors are upset by a particular medicine, and the imbalance causes multiple types of muscle spasm. This condition may also be caused by nervous system disorders, such as:  Stroke.  Multiple sclerosis (MS).  Cerebral palsy. In some cases, the cause is not known (idiopathic). What increases the risk? This condition is more likely to develop in people who take certain medicines, most often medicines that are used to treat psychiatric conditions or nausea. What are the signs or symptoms? Symptoms of dystonia can vary. Symptoms may include:  Muscle twitches  or spasms around your eyes (blepharospasm).  Foot cramping or dragging.  Pulling of your neck to one side (torticollis) or backward (retrocollis).  Muscles spasms of your face.  Spasms of your voice box (larynx).  Tremors.  Awkward and painful positions.  Muscle cramping after muscle use.  Spasm of your jaw muscles that makes it difficult to open your mouth. How is this diagnosed? This condition is often easy to diagnose based on the patterns of muscle contractions in your body and how your body responds to treatment. Diagnosis will also include a physical exam and medical history. You may have other tests if the cause of your condition is not known. How is this treated? The treatment of this condition depends on the underlying cause. If this condition is caused by medicine that you take, your health care provider will likely recommend stopping the use of that medicine. Most often, this condition can be treated with medicines that help to relax the muscles and reverse the reaction (anticholinergics). Botulinum toxin may also be injected to stop the involved muscles from contracting. In severe cases when these treatments do not work, surgery and brain stimulation may be required. Follow these instructions at home:  Talk with your health care provider about avoiding the use of the medicine or medicines that are thought to be the cause of the reaction.  Do not drive or operate heavy machinery until your health care provider approves.  Take medicines only as directed by your health care provider. Contact a health care provider if:  Your original symptoms return after treatment. This information is not intended to replace advice  given to you by your health care provider. Make sure you discuss any questions you have with your health care provider. Document Released: 08/10/2000 Document Revised: 01/19/2016 Document Reviewed: 08/09/2014  2017 Elsevier

## 2016-10-04 NOTE — Progress Notes (Signed)
Patient ID: Kelly [name], female    DOB: 05-01-1996, 21 y.o.   MRN: [id number]  PCP: Dione BoozePARTRIDGE,JAMES, MD  Chief Complaint  Patient presents with  . Spasms    X 2-3 weeks  having cramps and muscle spasms in legs - with burning    Subjective:  HPI 21 year old female presents for evaluation muscle spasms with cramps x 2-3 weeks. Past medical history includes systemic lupus erythematous with integumentary and MSK involvement and  Vitamin D deficiency in which she is currently taking replacement weekly.  Reports over the last few weeks multiple episodes of muscle spasms originating in her lower back and radiating into upper thighs. At times episodes of cramps have been so severe pt reports an inability to walk. Spasms are intermittent. Pt is currently treated for SLE symptoms with Benlysta infusions monthly.  Due to inclement weather she missed her January infusion and had missed an infusion in November. Next infusion scheduled for 10/10/16. She reports improved "good control" of symptoms after receiving infusions. She also takes plaquenil and has taken prednisone intermittently and chronically for MSK pain symptoms.  Denies any recent fever, dizziness, or new rashes. Social History   Social History  . Marital status: Single    Spouse name: N/A  . Number of children: N/A  . Years of education: N/A   Occupational History  . Not on file.   Social History Main Topics  . Smoking status: Current Every Day Smoker  . Smokeless tobacco: Never Used  . Alcohol use Yes  . Drug use: Yes    Types: Marijuana  . Sexual activity: Yes    Birth control/ protection: Pill   Other Topics Concern  . Not on file   Social History Narrative  . No narrative on file    Family History  Problem Relation Age of Onset  . Diabetes Paternal Grandmother    Review of Systems See HPI  No Known Allergies  Prior to Admission medications   Medication Sig Start Date End Date Taking? Authorizing  Provider  acetaminophen (TYLENOL) 500 MG tablet Take 1,000 mg by mouth every 6 (six) hours as needed for moderate pain or headache.   Yes Historical Provider, MD  albuterol (PROVENTIL HFA;VENTOLIN HFA) 108 (90 Base) MCG/ACT inhaler Inhale 2 puffs into the lungs every 4 (four) hours as needed for wheezing or shortness of breath. 06/27/16  Yes Tatyana Kirichenko, PA-C  etonogestrel (NEXPLANON) 68 MG IMPL implant 1 each by Subdermal route once. Implanted in left upper arm.  Placed February 27, 2016   Yes Historical Provider, MD  hydroxychloroquine (PLAQUENIL) 200 MG tablet Take 1 tablet (200 mg total) by mouth daily. Patient taking differently: Take 300 mg by mouth daily.  06/17/15  Yes Lorre NickAnthony Allen, MD  naproxen (NAPROSYN) 500 MG tablet Take 1 tablet (500 mg total) by mouth 2 (two) times daily as needed. 04/06/16  Yes Jaime Pilcher Ward, PA-C  predniSONE (DELTASONE) 20 MG tablet Take 3 tablets (60 mg total) by mouth daily. 06/17/15  Yes Lorre NickAnthony Allen, MD  Vitamin D, Ergocalciferol, (DRISDOL) 50000 units CAPS capsule Take 50,000 Units by mouth every 7 (seven) days.   Yes Historical Provider, MD  benzonatate (TESSALON) 100 MG capsule Take 1 capsule (100 mg total) by mouth every 8 (eight) hours. Patient not taking: Reported on 10/04/2016 06/27/16   Tatyana Kirichenko, PA-C  cephALEXin (KEFLEX) 250 MG capsule Take 1 capsule (250 mg total) by mouth 4 (four) times daily. Patient not taking: Reported on 10/04/2016 08/01/15  Loren Racer, MD  HYDROcodone-acetaminophen (NORCO/VICODIN) 5-325 MG tablet Take 2 tablets by mouth every 4 (four) hours as needed. Patient not taking: Reported on 10/04/2016 07/18/16   Eyvonne Mechanic, PA-C  oxyCODONE-acetaminophen (PERCOCET/ROXICET) 5-325 MG tablet Take 1-2 tablets by mouth every 6 (six) hours as needed for severe pain. Patient not taking: Reported on 10/04/2016 06/17/15   Lorre Nick, MD  predniSONE (DELTASONE) 5 MG tablet Take 5 mg by mouth daily.  07/15/15   Historical  Provider, MD    Past Medical, Surgical Family and Social History reviewed and updated.    Objective:   Today's Vitals   10/04/16 1124  BP: 108/68  Pulse: 86  Temp: 98.5 F (36.9 C)  TempSrc: Oral  SpO2: 100%  Weight: 129 lb 9.6 oz (58.8 kg)  Height: 5\' 1"  (1.549 m)    Wt Readings from Last 3 Encounters:  10/04/16 129 lb 9.6 oz (58.8 kg)  08/15/16 127 lb 3.2 oz (57.7 kg)  06/21/16 129 lb 9.6 oz (58.8 kg)    Physical Exam  Constitutional: She is oriented to person, place, and time. She appears well-developed and well-nourished.  HENT:  Head: Normocephalic and atraumatic.  Neck: Normal range of motion. Neck supple. No thyromegaly present.  Cardiovascular: Normal rate, regular rhythm, normal heart sounds and intact distal pulses.   Pulmonary/Chest: Effort normal and breath sounds normal.  Musculoskeletal: She exhibits no edema or deformity.  Negative for bony tenderness or reproducible spasms Complete ROM (passive and active)  Lymphadenopathy:    She has no cervical adenopathy.  Neurological: She is alert and oriented to person, place, and time. She has normal reflexes. Coordination normal.  Skin: Skin is dry. Rash noted. No erythema.  Very dry patchy areas face, neck, arms, and legs  Psychiatric: She has a normal mood and affect. Her behavior is normal. Judgment and thought content normal.      Assessment & Plan:  1. Muscle spasm 2. Pain in both lower extremities 3. Sciatica, unspecified laterality 4. B12 deficiency  Start Naproxen 500 mg every 12 hours as needed for muscle aches.  Take Cyclobenzaprine 10 mg at bedtime for muscle spasms.  Someone from our office will contact you to notify you of your lab results.  Keep infusion appointment on 10/10/16. Keep all appointment with Rheumatology.   Godfrey Pick. Tiburcio Pea, MSN, FNP-C Primary Care at [facility] [phone]

## 2016-10-05 LAB — CBC WITH DIFFERENTIAL/PLATELET
BASOS ABS: 0 10*3/uL (ref 0.0–0.2)
Basos: 1 %
EOS (ABSOLUTE): 0.1 10*3/uL (ref 0.0–0.4)
Eos: 2 %
Hematocrit: 35.8 % (ref 34.0–46.6)
Hemoglobin: 11.8 g/dL (ref 11.1–15.9)
IMMATURE GRANS (ABS): 0 10*3/uL (ref 0.0–0.1)
IMMATURE GRANULOCYTES: 1 %
LYMPHS: 23 %
Lymphocytes Absolute: 0.9 10*3/uL (ref 0.7–3.1)
MCH: 28.7 pg (ref 26.6–33.0)
MCHC: 33 g/dL (ref 31.5–35.7)
MCV: 87 fL (ref 79–97)
Monocytes Absolute: 0.4 10*3/uL (ref 0.1–0.9)
Monocytes: 11 %
NEUTROS ABS: 2.4 10*3/uL (ref 1.4–7.0)
NEUTROS PCT: 62 %
PLATELETS: 257 10*3/uL (ref 150–379)
RBC: 4.11 x10E6/uL (ref 3.77–5.28)
RDW: 14.2 % (ref 12.3–15.4)
WBC: 3.8 10*3/uL (ref 3.4–10.8)

## 2016-10-05 LAB — SEDIMENTATION RATE: SED RATE: 20 mm/h (ref 0–32)

## 2016-10-05 LAB — CMP14+EGFR
ALK PHOS: 40 IU/L (ref 39–117)
ALT: 14 IU/L (ref 0–32)
AST: 14 IU/L (ref 0–40)
Albumin/Globulin Ratio: 1.1 — ABNORMAL LOW (ref 1.2–2.2)
Albumin: 3.5 g/dL (ref 3.5–5.5)
BILIRUBIN TOTAL: 0.4 mg/dL (ref 0.0–1.2)
BUN/Creatinine Ratio: 13 (ref 9–23)
BUN: 9 mg/dL (ref 6–20)
CHLORIDE: 104 mmol/L (ref 96–106)
CO2: 22 mmol/L (ref 18–29)
CREATININE: 0.69 mg/dL (ref 0.57–1.00)
Calcium: 9.2 mg/dL (ref 8.7–10.2)
GFR calc Af Amer: 144 mL/min/{1.73_m2} (ref >59)
GFR calc non Af Amer: 125 mL/min/{1.73_m2} (ref >59)
GLUCOSE: 86 mg/dL (ref 65–99)
Globulin, Total: 3.3 g/dL (ref 1.5–4.5)
Potassium: 3.3 mmol/L — ABNORMAL LOW (ref 3.5–5.2)
Sodium: 142 mmol/L (ref 134–144)
Total Protein: 6.8 g/dL (ref 6.0–8.5)

## 2016-10-05 LAB — VITAMIN B12: VITAMIN B 12: 214 pg/mL — AB (ref 232–1245)

## 2016-10-05 LAB — VITAMIN D 25 HYDROXY (VIT D DEFICIENCY, FRACTURES): VIT D 25 HYDROXY: 44.3 ng/mL (ref 30.0–100.0)

## 2016-10-05 LAB — THYROID PANEL WITH TSH
FREE THYROXINE INDEX: 1.8 (ref 1.2–4.9)
T3 UPTAKE RATIO: 27 % (ref 24–39)
T4 TOTAL: 6.5 ug/dL (ref 4.5–12.0)
TSH: 2.46 u[IU]/mL (ref 0.450–4.500)

## 2016-10-10 ENCOUNTER — Encounter (HOSPITAL_COMMUNITY)
Admission: RE | Admit: 2016-10-10 | Discharge: 2016-10-10 | Disposition: A | Discharge status: Home / Self Care | Payer: BLUE CROSS/BLUE SHIELD | Source: Ambulatory Visit | Attending: Rheumatology | Admitting: Rheumatology

## 2016-10-10 ENCOUNTER — Encounter (HOSPITAL_COMMUNITY): Payer: Self-pay

## 2016-10-10 DIAGNOSIS — M329 Systemic lupus erythematosus, unspecified: Secondary | ICD-10-CM | POA: Diagnosis not present

## 2016-10-10 HISTORY — DX: Pneumonia, unspecified organism: J18.9

## 2016-10-10 MED ORDER — SODIUM CHLORIDE 0.9 % IV SOLN
INTRAVENOUS | Status: DC
  Administered 2016-10-10: 08:00:00 via INTRAVENOUS

## 2016-10-10 MED ORDER — SODIUM CHLORIDE 0.9 % IV SOLN
10.0000 mg/kg | INTRAVENOUS | Status: DC
  Administered 2016-10-10: 576 mg via INTRAVENOUS
  Filled 2016-10-10: qty 7.2

## 2016-10-10 NOTE — Discharge Instructions (Signed)
Belimumab solution for injection What is this medicine? BELIMUMAB (be LIM ue mab) is a medicine used to treat a certain type of systemic lupus erythematosus (SLE). This medicine is used with standard therapy for SLE. It is not a cure. COMMON BRAND NAME(S): Benlysta What should I tell my health care provider before I take this medicine? They need to know if you have any of these conditions: -heart disease -immune system problems -infection (especially a virus infection such as chickenpox, cold sores, or herpes) -mental illness -suicidal thoughts, plans, or attempt; a previous suicide attempt by you or a family member -an unusual or allergic reaction to belimumab, other medicines, foods, dyes, or preservatives -pregnant or trying to get pregnant -breast-feeding How should I use this medicine? This medicine is for infusion into a vein. It is given by a health care professional in a hospital or clinic setting. A special MedGuide will be given to you by the pharmacist with each prescription and refill. Be sure to read this information carefully each time. Talk to your pediatrician regarding the use of this medicine in children. Special care may be needed. What if I miss a dose? It is important not to miss your dose. Call your doctor or health care professional if you are unable to keep an appointment. What may interact with this medicine? Do not take this medicine with any of the following medications: -live virus vaccines This medicine may also interact with the following medications: -cyclophosphamide -ofatumumab -rituximab What should I watch for while using this medicine? Tell your doctor or healthcare professional if your symptoms do not start to get better or if they get worse. Your condition will be monitored carefully while you are receiving this medicine. What side effects may I notice from receiving this medicine? Side effects that you should report to your doctor or health care  professional as soon as possible: -allergic reactions like skin rash, itching or hives, swelling of the face, lips, or tongue -depressed mood -signs of infection - fever or chills, cough, sore throat, pain or difficulty passing urine -suicidal thoughts or other mood changes -unusually slow heartbeat Side effects that usually do not require medical attention (report to your doctor or health care professional if they continue or are bothersome): -diarrhea -headache -muscle aches -nausea, vomiting -trouble sleeping Where should I keep my medicine? This drug is given in a hospital or clinic and will not be stored at home.  2017 Elsevier/Gold Standard (2015-09-15 08:22:00)

## 2016-10-15 MED ORDER — VITAMIN B-12 1000 MCG PO TABS: 1000.0000 ug | ORAL_TABLET | Freq: Every day | ORAL | 2 refills | Status: DC

## 2016-10-15 MED ORDER — POTASSIUM CHLORIDE 20 MEQ PO PACK: 20.0000 meq | PACK | Freq: Three times a day (TID) | ORAL | 0 refills | Status: DC

## 2016-11-09 ENCOUNTER — Encounter (HOSPITAL_COMMUNITY)
Admission: RE | Admit: 2016-11-09 | Discharge: 2016-11-09 | Disposition: A | Discharge status: Home / Self Care | Payer: BLUE CROSS/BLUE SHIELD | Source: Ambulatory Visit | Attending: Rheumatology | Admitting: Rheumatology

## 2016-11-09 ENCOUNTER — Encounter (HOSPITAL_COMMUNITY): Payer: Self-pay

## 2016-11-09 DIAGNOSIS — M329 Systemic lupus erythematosus, unspecified: Secondary | ICD-10-CM | POA: Insufficient documentation

## 2016-11-09 MED ORDER — SODIUM CHLORIDE 0.9 % IV SOLN
INTRAVENOUS | Status: DC
  Administered 2016-11-09: 13:00:00 via INTRAVENOUS

## 2016-11-09 MED ORDER — SODIUM CHLORIDE 0.9 % IV SOLN
600.0000 mg | INTRAVENOUS | Status: DC
  Administered 2016-11-09: 600 mg via INTRAVENOUS
  Filled 2016-11-09: qty 7.5

## 2016-11-13 DIAGNOSIS — M2550 Pain in unspecified joint: Secondary | ICD-10-CM | POA: Diagnosis not present

## 2016-11-13 DIAGNOSIS — M064 Inflammatory polyarthropathy: Secondary | ICD-10-CM | POA: Diagnosis not present

## 2016-11-13 DIAGNOSIS — M329 Systemic lupus erythematosus, unspecified: Secondary | ICD-10-CM | POA: Diagnosis not present

## 2016-12-10 ENCOUNTER — Encounter (HOSPITAL_COMMUNITY)
Admission: RE | Admit: 2016-12-10 | Payer: BLUE CROSS/BLUE SHIELD | Source: Ambulatory Visit | Attending: Family Medicine | Admitting: Family Medicine

## 2017-02-13 DIAGNOSIS — L932 Other local lupus erythematosus: Secondary | ICD-10-CM | POA: Diagnosis not present

## 2017-02-13 DIAGNOSIS — M2550 Pain in unspecified joint: Secondary | ICD-10-CM | POA: Diagnosis not present

## 2017-02-13 DIAGNOSIS — M329 Systemic lupus erythematosus, unspecified: Secondary | ICD-10-CM | POA: Diagnosis not present

## 2017-02-13 DIAGNOSIS — M064 Inflammatory polyarthropathy: Secondary | ICD-10-CM | POA: Diagnosis not present

## 2017-02-13 DIAGNOSIS — Z79899 Other long term (current) drug therapy: Secondary | ICD-10-CM | POA: Diagnosis not present

## 2017-03-06 ENCOUNTER — Encounter (HOSPITAL_COMMUNITY): Payer: Self-pay | Admitting: Emergency Medicine

## 2017-03-06 ENCOUNTER — Emergency Department (HOSPITAL_COMMUNITY): Payer: BLUE CROSS/BLUE SHIELD

## 2017-03-06 ENCOUNTER — Emergency Department (HOSPITAL_COMMUNITY)
Admission: EM | Admit: 2017-03-06 | Discharge: 2017-03-06 | Disposition: A | Discharge status: Home / Self Care | Payer: BLUE CROSS/BLUE SHIELD | Attending: Emergency Medicine | Admitting: Emergency Medicine

## 2017-03-06 DIAGNOSIS — R1011 Right upper quadrant pain: Secondary | ICD-10-CM

## 2017-03-06 DIAGNOSIS — F17210 Nicotine dependence, cigarettes, uncomplicated: Secondary | ICD-10-CM | POA: Diagnosis not present

## 2017-03-06 DIAGNOSIS — J45909 Unspecified asthma, uncomplicated: Secondary | ICD-10-CM | POA: Diagnosis not present

## 2017-03-06 DIAGNOSIS — M3210 Systemic lupus erythematosus, organ or system involvement unspecified: Secondary | ICD-10-CM | POA: Diagnosis not present

## 2017-03-06 DIAGNOSIS — Z79899 Other long term (current) drug therapy: Secondary | ICD-10-CM | POA: Diagnosis not present

## 2017-03-06 DIAGNOSIS — R1013 Epigastric pain: Secondary | ICD-10-CM | POA: Diagnosis not present

## 2017-03-06 HISTORY — DX: Unspecified asthma, uncomplicated: J45.909

## 2017-03-06 LAB — CBC WITH DIFFERENTIAL/PLATELET
BASOS ABS: 0 10*3/uL (ref 0.0–0.1)
BASOS PCT: 0 %
EOS ABS: 0.1 10*3/uL (ref 0.0–0.7)
Eosinophils Relative: 2 %
HCT: 35.9 % — ABNORMAL LOW (ref 36.0–46.0)
Hemoglobin: 12 g/dL (ref 12.0–15.0)
Lymphocytes Relative: 22 %
Lymphs Abs: 0.9 10*3/uL (ref 0.7–4.0)
MCH: 29 pg (ref 26.0–34.0)
MCHC: 33.4 g/dL (ref 30.0–36.0)
MCV: 86.7 fL (ref 78.0–100.0)
MONO ABS: 0.2 10*3/uL (ref 0.1–1.0)
MONOS PCT: 6 %
Neutro Abs: 2.9 10*3/uL (ref 1.7–7.7)
Neutrophils Relative %: 70 %
PLATELETS: 239 10*3/uL (ref 150–400)
RBC: 4.14 MIL/uL (ref 3.87–5.11)
RDW: 13.9 % (ref 11.5–15.5)
WBC: 4.2 10*3/uL (ref 4.0–10.5)

## 2017-03-06 LAB — POC URINE PREG, ED: Preg Test, Ur: NEGATIVE

## 2017-03-06 LAB — COMPREHENSIVE METABOLIC PANEL
ALBUMIN: 3.4 g/dL — AB (ref 3.5–5.0)
ALK PHOS: 38 U/L (ref 38–126)
ALT: 13 U/L — ABNORMAL LOW (ref 14–54)
AST: 21 U/L (ref 15–41)
Anion gap: 6 (ref 5–15)
BILIRUBIN TOTAL: 0.4 mg/dL (ref 0.3–1.2)
BUN: 9 mg/dL (ref 6–20)
CALCIUM: 8.7 mg/dL — AB (ref 8.9–10.3)
CO2: 25 mmol/L (ref 22–32)
Chloride: 108 mmol/L (ref 101–111)
Creatinine, Ser: 0.7 mg/dL (ref 0.44–1.00)
GFR calc Af Amer: >60 mL/min (ref >60)
GLUCOSE: 88 mg/dL (ref 65–99)
POTASSIUM: 3.4 mmol/L — AB (ref 3.5–5.1)
Sodium: 139 mmol/L (ref 135–145)
TOTAL PROTEIN: 6.8 g/dL (ref 6.5–8.1)

## 2017-03-06 LAB — URINALYSIS, ROUTINE W REFLEX MICROSCOPIC
BILIRUBIN URINE: NEGATIVE
Glucose, UA: NEGATIVE mg/dL
Hgb urine dipstick: NEGATIVE
Ketones, ur: NEGATIVE mg/dL
LEUKOCYTES UA: NEGATIVE
Nitrite: NEGATIVE
PH: 6 (ref 5.0–8.0)
Protein, ur: 30 mg/dL — AB
SPECIFIC GRAVITY, URINE: 1.023 (ref 1.005–1.030)

## 2017-03-06 LAB — I-STAT TROPONIN, ED: TROPONIN I, POC: 0 ng/mL (ref 0.00–0.08)

## 2017-03-06 LAB — LIPASE, BLOOD: LIPASE: 39 U/L (ref 11–51)

## 2017-03-06 MED ORDER — TRAMADOL HCL 50 MG PO TABS: 50.0000 mg | ORAL_TABLET | Freq: Four times a day (QID) | ORAL | 0 refills | Status: DC | PRN

## 2017-03-06 MED ORDER — SODIUM CHLORIDE 0.9 % IV BOLUS (SEPSIS)
1000.0000 mL | Freq: Once | INTRAVENOUS | Status: AC
  Administered 2017-03-06: 1000 mL via INTRAVENOUS

## 2017-03-06 MED ORDER — KETOROLAC TROMETHAMINE 30 MG/ML IJ SOLN
30.0000 mg | Freq: Once | INTRAMUSCULAR | Status: AC
  Administered 2017-03-06: 30 mg via INTRAVENOUS
  Filled 2017-03-06: qty 1

## 2017-03-06 NOTE — Discharge Instructions (Signed)
Ibuprofen 600 mg 3 times daily for the next 3 days. Tramadol as prescribed as needed for pain not relieved with ibuprofen.  Follow-up with your primary Dr. if you're not improving in the next 3-4 days, and return to the ER if you develop worsening pain, difficulty breathing, or other new and concerning symptoms.

## 2017-03-06 NOTE — ED Notes (Signed)
IV infiltrated; warm compress applied on way to US.

## 2017-03-06 NOTE — ED Notes (Signed)
Pain is reproducible with movement. Reports she had flare up a month ago and got back on injections.

## 2017-03-06 NOTE — ED Provider Notes (Signed)
[facility]-EMERGENCY DEPT Provider Note   CSN: [id number] Arrival date & time: 03/06/17  [id number]     History   Chief Complaint Chief Complaint  Patient presents with  . Abdominal Pain    HPI Kelly [name] is a 21 y.o. female.  Patient is a 21 year old female with history of lupus presenting with complaints of right upper quadrant pain. This started several days ago and is worsening. Her pain is intermittent and related with movement and change in position. There does not seem to be any relation to food. She does feel nauseated, but has not vomited. She denies any difficulty breathing, fevers, chills, or productive cough.      Past Medical History:  Diagnosis Date  . Anxiety   . Asthma   . Chest discomfort feb 2017   Pt states due to shortness of breath  . Lupus (systemic lupus erythematosus) (HCC)    dx 2010  . Pneumonia    2015  . Shortness of breath dyspnea feb 2017   changed inhaler with relief    There are no active problems to display for this patient.   Past Surgical History:  Procedure Laterality Date  . WISDOM TOOTH EXTRACTION  2014    OB History    No data available       Home Medications    Prior to Admission medications   Medication Sig Start Date End Date Taking? Authorizing Provider  acetaminophen (TYLENOL) 500 MG tablet Take 1,000 mg by mouth every 6 (six) hours as needed for moderate pain or headache.    [provider]  albuterol (PROVENTIL HFA;VENTOLIN HFA) 108 (90 Base) MCG/ACT inhaler Inhale 2 puffs into the lungs every 4 (four) hours as needed for wheezing or shortness of breath. 06/27/16   Kirichenko, Tatyana, PA-C  benzonatate (TESSALON) 100 MG capsule Take 1 capsule (100 mg total) by mouth every 8 (eight) hours. Patient not taking: Reported on 10/04/2016 06/27/16   Jaynie CrumbleKirichenko, Tatyana, PA-C  cephALEXin (KEFLEX) 250 MG capsule Take 1 capsule (250 mg total) by mouth 4 (four) times daily. Patient not taking: Reported on 10/04/2016 08/01/15    Loren RacerYelverton, David, MD  cyclobenzaprine (FLEXERIL) 10 MG tablet Take 1 tablet (10 mg total) by mouth 3 (three) times daily as needed for muscle spasms. 10/04/16   Bing NeighborsHarris, Kimberly S, FNP  etonogestrel (NEXPLANON) 68 MG IMPL implant 1 each by Subdermal route once. Implanted in left upper arm.  Placed February 27, 2016    [provider]  HYDROcodone-acetaminophen (NORCO/VICODIN) 5-325 MG tablet Take 2 tablets by mouth every 4 (four) hours as needed. Patient not taking: Reported on 10/04/2016 07/18/16   Hedges, Tinnie GensJeffrey, PA-C  hydroxychloroquine (PLAQUENIL) 200 MG tablet Take 1 tablet (200 mg total) by mouth daily. Patient taking differently: Take 300 mg by mouth daily.  06/17/15   Lorre NickAllen, Anthony, MD  naproxen (NAPROSYN) 500 MG tablet Take 1 tablet (500 mg total) by mouth every 12 (twelve) hours as needed. 10/04/16   Bing NeighborsHarris, Kimberly S, FNP  oxyCODONE-acetaminophen (PERCOCET/ROXICET) 5-325 MG tablet Take 1-2 tablets by mouth every 6 (six) hours as needed for severe pain. Patient not taking: Reported on 10/04/2016 06/17/15   Lorre NickAllen, Anthony, MD  potassium chloride (KLOR-CON) 20 MEQ packet Take 20 mEq by mouth 3 (three) times daily. 10/15/16   Bing NeighborsHarris, Kimberly S, FNP  predniSONE (DELTASONE) 20 MG tablet Take 3 tablets (60 mg total) by mouth daily. 06/17/15   Lorre NickAllen, Anthony, MD  predniSONE (DELTASONE) 5 MG tablet Take 5 mg by mouth  daily.  07/15/15   [provider]  vitamin B-12 (CYANOCOBALAMIN) 1000 MCG tablet Take 1 tablet (1,000 mcg total) by mouth daily. 10/15/16   Bing Neighbors, FNP  Vitamin D, Ergocalciferol, (DRISDOL) 50000 units CAPS capsule Take 50,000 Units by mouth every 7 (seven) days.    [provider]    Family History Family History  Problem Relation Age of Onset  . Diabetes Paternal Grandmother     Social History Social History  Substance Use Topics  . Smoking status: Current Some Day Smoker    Types: Cigarettes  . Smokeless tobacco: Never Used  . Alcohol use  No     Allergies   Patient has no known allergies.   Review of Systems Review of Systems  All other systems reviewed and are negative.    Physical Exam Updated Vital Signs BP (!) 127/92 (BP Location: Right Arm)   Pulse (!) 108   Temp 98.3 F (36.8 C) (Oral)   Resp 18   SpO2 100%   Physical Exam  Constitutional: She is oriented to person, place, and time. She appears well-developed and well-nourished. No distress.  HENT:  Head: Normocephalic and atraumatic.  Neck: Normal range of motion. Neck supple.  Cardiovascular: Normal rate and regular rhythm.  Exam reveals no gallop and no friction rub.   No murmur heard. Pulmonary/Chest: Effort normal and breath sounds normal. No respiratory distress. She has no wheezes.  Abdominal: Soft. Bowel sounds are normal. She exhibits no distension. There is tenderness.  There is tenderness to palpation in the right upper quadrant and right lower ribs.  Musculoskeletal: Normal range of motion.  Neurological: She is alert and oriented to person, place, and time.  Skin: Skin is warm and dry. She is not diaphoretic.  Nursing note and vitals reviewed.    ED Treatments / Results  Labs (all labs ordered are listed, but only abnormal results are displayed) Labs Reviewed  URINALYSIS, ROUTINE W REFLEX MICROSCOPIC  COMPREHENSIVE METABOLIC PANEL  LIPASE, BLOOD  CBC WITH DIFFERENTIAL/PLATELET  POC URINE PREG, ED  I-STAT TROPOININ, ED    EKG  EKG Interpretation None       Radiology No results found.  Procedures Procedures (including critical care time)  Medications Ordered in ED Medications  sodium chloride 0.9 % bolus 1,000 mL (not administered)  ketorolac (TORADOL) 30 MG/ML injection 30 mg (not administered)     Initial Impression / Assessment and Plan / ED Course  I have reviewed the triage vital signs and the nursing notes.  Pertinent labs & imaging results that were available during my care of the patient were reviewed  by me and considered in my medical decision making (see chart for details).  Patient with history of lupus presenting with epigastric/right upper quadrant pain. She is also tender over the right lower ribs. I suspect a musculoskeletal etiology as her workup reveals no significant laboratory abnormalities. Cardiac workup is negative. Gallbladder ultrasound is normal. She will be discharged with anti-inflammatories, tramadol, and follow-up as needed if not improving.  Final Clinical Impressions(s) / ED Diagnoses   Final diagnoses:  Right upper quadrant pain    New Prescriptions New Prescriptions   No medications on file     Geoffery Lyons, MD 03/06/17 1243

## 2017-03-06 NOTE — ED Notes (Signed)
Patient transported to Ultrasound 

## 2017-03-06 NOTE — ED Triage Notes (Signed)
Rt chest and abd pain x 1 week , meds that she is taking for lupus has not been helping she states , some =nause and some sob for the whole week

## 2017-04-24 ENCOUNTER — Emergency Department (HOSPITAL_COMMUNITY)
Admission: EM | Admit: 2017-04-24 | Discharge: 2017-04-24 | Disposition: A | Discharge status: Home / Self Care | Payer: BLUE CROSS/BLUE SHIELD | Attending: Emergency Medicine | Admitting: Emergency Medicine

## 2017-04-24 ENCOUNTER — Emergency Department (HOSPITAL_COMMUNITY): Payer: BLUE CROSS/BLUE SHIELD

## 2017-04-24 DIAGNOSIS — Z79899 Other long term (current) drug therapy: Secondary | ICD-10-CM | POA: Insufficient documentation

## 2017-04-24 DIAGNOSIS — R079 Chest pain, unspecified: Secondary | ICD-10-CM | POA: Diagnosis not present

## 2017-04-24 DIAGNOSIS — F17210 Nicotine dependence, cigarettes, uncomplicated: Secondary | ICD-10-CM | POA: Diagnosis not present

## 2017-04-24 DIAGNOSIS — R0781 Pleurodynia: Secondary | ICD-10-CM | POA: Diagnosis not present

## 2017-04-24 DIAGNOSIS — R071 Chest pain on breathing: Secondary | ICD-10-CM | POA: Diagnosis not present

## 2017-04-24 LAB — CBC
HCT: 33.2 % — ABNORMAL LOW (ref 36.0–46.0)
Hemoglobin: 11.6 g/dL — ABNORMAL LOW (ref 12.0–15.0)
MCH: 29.1 pg (ref 26.0–34.0)
MCHC: 34.9 g/dL (ref 30.0–36.0)
MCV: 83.4 fL (ref 78.0–100.0)
Platelets: 247 10*3/uL (ref 150–400)
RBC: 3.98 MIL/uL (ref 3.87–5.11)
RDW: 13.9 % (ref 11.5–15.5)
WBC: 4.8 10*3/uL (ref 4.0–10.5)

## 2017-04-24 LAB — BASIC METABOLIC PANEL
Anion gap: 6 (ref 5–15)
BUN: 10 mg/dL (ref 6–20)
CO2: 26 mmol/L (ref 22–32)
Calcium: 8.6 mg/dL — ABNORMAL LOW (ref 8.9–10.3)
Chloride: 108 mmol/L (ref 101–111)
Creatinine, Ser: 0.69 mg/dL (ref 0.44–1.00)
GFR calc Af Amer: >60 mL/min (ref >60)
GFR calc non Af Amer: >60 mL/min (ref >60)
Glucose, Bld: 95 mg/dL (ref 65–99)
Potassium: 3.7 mmol/L (ref 3.5–5.1)
Sodium: 140 mmol/L (ref 135–145)

## 2017-04-24 LAB — D-DIMER, QUANTITATIVE: D-Dimer, Quant: 0.31 ug/mL-FEU (ref 0.00–0.50)

## 2017-04-24 LAB — POCT I-STAT TROPONIN I: Troponin i, poc: 0 ng/mL (ref 0.00–0.08)

## 2017-04-24 MED ORDER — ALBUTEROL SULFATE HFA 108 (90 BASE) MCG/ACT IN AERS: 1.0000 | INHALATION_SPRAY | Freq: Four times a day (QID) | RESPIRATORY_TRACT | 0 refills | Status: DC | PRN

## 2017-04-24 MED ORDER — ALBUTEROL SULFATE HFA 108 (90 BASE) MCG/ACT IN AERS: 1.0000 | INHALATION_SPRAY | RESPIRATORY_TRACT | Status: DC | PRN

## 2017-04-24 NOTE — ED Triage Notes (Signed)
Pt states that she has had chest pain, worse on inhalation, and palpitations x 1 week. Hx of PNA that felt similar to this. Alert and oriented.

## 2017-04-24 NOTE — Discharge Instructions (Signed)
Your symptoms are consistent with pleurisy. This may be related to your underlying lupus. I do not think this is from a serious process such as pneumonia or pulmonary embolism (blood clot in your lungs). Generally we recommend NSAIDs such as ibuprofen for the pain/inflammation.

## 2017-04-24 NOTE — ED Notes (Signed)
Blood draw attempted x1 unsuccessful 

## 2017-04-24 NOTE — ED Notes (Addendum)
Pt had drawn for labs:  Red Blue gold Lt green Dark green x2 Lavender

## 2017-04-24 NOTE — ED Notes (Signed)
Attempted pt blood draw with no success.

## 2017-04-24 NOTE — ED Notes (Signed)
Bed: WA06 Expected date:  Expected time:  Means of arrival:  Comments: [place]

## 2017-05-17 NOTE — ED Provider Notes (Signed)
[facility]-EMERGENCY DEPT Provider Note   CSN: [id number] Arrival date & time: 04/24/17  0919     History   Chief Complaint Chief Complaint  Patient presents with  . Chest Pain    HPI Kelly [name] is a 21 y.o. female.  HPI   21yF with CP. Onset a week ago. Worse with deep breaths. Sharp. NO cough. No fever or chills. No leg pain/swelling. Feels like prior pneumonia.   Past Medical History:  Diagnosis Date  . Anxiety   . Asthma   . Chest discomfort feb 2017   Pt states due to shortness of breath  . Lupus (systemic lupus erythematosus) (HCC)    dx 2010  . Pneumonia    2015  . Shortness of breath dyspnea feb 2017   changed inhaler with relief    There are no active problems to display for this patient.   Past Surgical History:  Procedure Laterality Date  . WISDOM TOOTH EXTRACTION  2014    OB History    No data available       Home Medications    Prior to Admission medications   Medication Sig Start Date End Date Taking? Authorizing Provider  acetaminophen (TYLENOL) 500 MG tablet Take 1,000 mg by mouth every 6 (six) hours as needed for moderate pain or headache.   Yes [provider]  albuterol (PROVENTIL HFA;VENTOLIN HFA) 108 (90 Base) MCG/ACT inhaler Inhale 2 puffs into the lungs every 4 (four) hours as needed for wheezing or shortness of breath. 06/27/16  Yes Kirichenko, Tatyana, PA-C  Belimumab (BENLYSTA IV) Inject 200 mg into the vein once a week.   Yes [provider]  BENLYSTA 200 MG/ML SOAJ 200 mg once a week. 04/22/17  Yes [provider]  etonogestrel (NEXPLANON) 68 MG IMPL implant 1 each by Subdermal route once. Implanted in left upper arm.  Placed February 27, 2016   Yes [provider]  hydroxychloroquine (PLAQUENIL) 200 MG tablet Take 1 tablet (200 mg total) by mouth daily. Patient taking differently: Take 300 mg by mouth daily.  06/17/15  Yes Lorre Nick, MD  polyvinyl alcohol (LIQUIFILM TEARS) 1.4 % ophthalmic  solution Place 1 drop into both eyes 3 (three) times daily as needed for dry eyes.   Yes [provider]  predniSONE (DELTASONE) 5 MG tablet Take 5 mg by mouth daily.  07/15/15  Yes [provider]  albuterol (PROVENTIL HFA;VENTOLIN HFA) 108 (90 Base) MCG/ACT inhaler Inhale 1-2 puffs into the lungs every 6 (six) hours as needed for wheezing or shortness of breath. 04/24/17   Raeford Razor, MD  benzonatate (TESSALON) 100 MG capsule Take 1 capsule (100 mg total) by mouth every 8 (eight) hours. Patient not taking: Reported on 10/04/2016 06/27/16   Jaynie Crumble, PA-C  cephALEXin (KEFLEX) 250 MG capsule Take 1 capsule (250 mg total) by mouth 4 (four) times daily. Patient not taking: Reported on 10/04/2016 08/01/15   Loren Racer, MD  cyclobenzaprine (FLEXERIL) 10 MG tablet Take 1 tablet (10 mg total) by mouth 3 (three) times daily as needed for muscle spasms. Patient not taking: Reported on 04/24/2017 10/04/16   Bing Neighbors, FNP  HYDROcodone-acetaminophen (NORCO/VICODIN) 5-325 MG tablet Take 2 tablets by mouth every 4 (four) hours as needed. Patient not taking: Reported on 10/04/2016 07/18/16   Hedges, Tinnie Gens, PA-C  naproxen (NAPROSYN) 500 MG tablet Take 1 tablet (500 mg total) by mouth every 12 (twelve) hours as needed. Patient not taking: Reported on 04/24/2017 10/04/16   Tiburcio Pea,  Godfrey Pick, FNP  oxyCODONE-acetaminophen (PERCOCET/ROXICET) 5-325 MG tablet Take 1-2 tablets by mouth every 6 (six) hours as needed for severe pain. Patient not taking: Reported on 10/04/2016 06/17/15   Lorre Nick, MD  potassium chloride (KLOR-CON) 20 MEQ packet Take 20 mEq by mouth 3 (three) times daily. Patient not taking: Reported on 04/24/2017 10/15/16   Bing Neighbors, FNP  predniSONE (DELTASONE) 20 MG tablet Take 3 tablets (60 mg total) by mouth daily. Patient not taking: Reported on 04/24/2017 06/17/15   Lorre Nick, MD  traMADol (ULTRAM) 50 MG tablet Take 1 tablet (50 mg total) by mouth  every 6 (six) hours as needed. Patient not taking: Reported on 04/24/2017 03/06/17   Geoffery Lyons, MD  vitamin B-12 (CYANOCOBALAMIN) 1000 MCG tablet Take 1 tablet (1,000 mcg total) by mouth daily. Patient not taking: Reported on 04/24/2017 10/15/16   Bing Neighbors, FNP    Family History Family History  Problem Relation Age of Onset  . Diabetes Paternal Grandmother     Social History Social History  Substance Use Topics  . Smoking status: Current Some Day Smoker    Types: Cigarettes  . Smokeless tobacco: Never Used  . Alcohol use No     Allergies   Patient has no known allergies.   Review of Systems Review of Systems   Physical Exam Updated Vital Signs BP 111/76 (BP Location: Left Arm)   Pulse 81   Temp 98.3 F (36.8 C) (Oral)   Resp 17   LMP 04/22/2017 (Approximate)   SpO2 99%   Physical Exam  Constitutional: She appears well-developed and well-nourished. No distress.  HENT:  Head: Normocephalic and atraumatic.  Eyes: Conjunctivae are normal. Right eye exhibits no discharge. Left eye exhibits no discharge.  Neck: Neck supple.  Cardiovascular: Normal rate, regular rhythm and normal heart sounds.  Exam reveals no gallop and no friction rub.   No murmur heard. Pulmonary/Chest: Effort normal and breath sounds normal. No respiratory distress.  Abdominal: Soft. She exhibits no distension. There is no tenderness.  Musculoskeletal: She exhibits no edema or tenderness.  Lower extremities symmetric as compared to each other. No calf tenderness. Negative Homan's. No palpable cords.   Neurological: She is alert.  Skin: Skin is warm and dry.  Psychiatric: She has a normal mood and affect. Her behavior is normal. Thought content normal.  Nursing note and vitals reviewed.    ED Treatments / Results  Labs (all labs ordered are listed, but only abnormal results are displayed) Labs Reviewed  BASIC METABOLIC PANEL - Abnormal; Notable for the following:       Result  Value   Calcium 8.6 (*)    All other components within normal limits  CBC - Abnormal; Notable for the following:    Hemoglobin 11.6 (*)    HCT 33.2 (*)    All other components within normal limits  D-DIMER, QUANTITATIVE (NOT AT [facility])  I-STAT TROPONIN, ED  POCT I-STAT TROPONIN I    EKG  EKG Interpretation  Date/Time:  Wednesday April 24 2017 09:30:58 EDT Ventricular Rate:  93 PR Interval:    QRS Duration: 85 QT Interval:  347 QTC Calculation: 432 R Axis:   53 Text Interpretation:  Sinus rhythm Borderline T abnormalities, diffuse leads Confirmed by Raeford Razor [phone]) on 04/24/2017 12:23:36 PM       Radiology No results found.   Dg Chest 2 View  Result Date: 04/24/2017 CLINICAL DATA:  Chest pain EXAM: CHEST  2 VIEW COMPARISON:  06/27/2016  FINDINGS: Subtle artifact from EKG pads. There is no edema, consolidation, effusion, or pneumothorax. Normal heart size and mediastinal contours. No osseous findings. IMPRESSION: Negative chest. Electronically Signed   By: Marnee Spring M.D.   On: 04/24/2017 09:56    Procedures Procedures (including critical care time)  Medications Ordered in ED Medications - No data to display   Initial Impression / Assessment and Plan / ED Course  I have reviewed the triage vital signs and the nursing notes.  Pertinent labs & imaging results that were available during my care of the patient were reviewed by me and considered in my medical decision making (see chart for details).     21yF with pleuritic. CP. Atypical for ACS. Doubt PE, dissection, serious infection. PRN NSAIDs.It has been determined that no acute conditions requiring further emergency intervention are present at this time. The patient has been advised of the diagnosis and plan. I reviewed any labs and imaging including any potential incidental findings. We have discussed signs and symptoms that warrant return to the ED and they are listed in the discharge instructions.     Final Clinical Impressions(s) / ED Diagnoses   Final diagnoses:  Pleuritic chest pain    New Prescriptions Discharge Medication List as of 04/24/2017  2:32 PM       Raeford Razor, MD 05/17/17 [phone]

## 2017-05-22 DIAGNOSIS — M2550 Pain in unspecified joint: Secondary | ICD-10-CM | POA: Diagnosis not present

## 2017-05-22 DIAGNOSIS — M064 Inflammatory polyarthropathy: Secondary | ICD-10-CM | POA: Diagnosis not present

## 2017-05-22 DIAGNOSIS — M329 Systemic lupus erythematosus, unspecified: Secondary | ICD-10-CM | POA: Diagnosis not present

## 2017-05-22 DIAGNOSIS — L932 Other local lupus erythematosus: Secondary | ICD-10-CM | POA: Diagnosis not present

## 2017-08-28 DIAGNOSIS — L932 Other local lupus erythematosus: Secondary | ICD-10-CM | POA: Diagnosis not present

## 2017-08-28 DIAGNOSIS — M064 Inflammatory polyarthropathy: Secondary | ICD-10-CM | POA: Diagnosis not present

## 2017-08-28 DIAGNOSIS — M329 Systemic lupus erythematosus, unspecified: Secondary | ICD-10-CM | POA: Diagnosis not present

## 2017-08-28 DIAGNOSIS — R071 Chest pain on breathing: Secondary | ICD-10-CM | POA: Diagnosis not present

## 2017-09-03 DIAGNOSIS — M329 Systemic lupus erythematosus, unspecified: Secondary | ICD-10-CM | POA: Diagnosis not present

## 2017-10-02 DIAGNOSIS — Z0100 Encounter for examination of eyes and vision without abnormal findings: Secondary | ICD-10-CM | POA: Diagnosis not present

## 2017-10-25 DIAGNOSIS — M2550 Pain in unspecified joint: Secondary | ICD-10-CM | POA: Diagnosis not present

## 2017-10-25 DIAGNOSIS — L932 Other local lupus erythematosus: Secondary | ICD-10-CM | POA: Diagnosis not present

## 2017-10-25 DIAGNOSIS — M064 Inflammatory polyarthropathy: Secondary | ICD-10-CM | POA: Diagnosis not present

## 2017-10-25 DIAGNOSIS — M329 Systemic lupus erythematosus, unspecified: Secondary | ICD-10-CM | POA: Diagnosis not present

## 2017-11-06 ENCOUNTER — Ambulatory Visit (HOSPITAL_COMMUNITY)
Admission: EM | Admit: 2017-11-06 | Discharge: 2017-11-06 | Disposition: A | Discharge status: Home / Self Care | Payer: BLUE CROSS/BLUE SHIELD | Attending: Family Medicine | Admitting: Family Medicine

## 2017-11-06 ENCOUNTER — Emergency Department (HOSPITAL_COMMUNITY)
Admission: EM | Admit: 2017-11-06 | Discharge: 2017-11-06 | Discharge status: Against Medical Advice | Payer: BLUE CROSS/BLUE SHIELD | Source: Home / Self Care

## 2017-11-06 ENCOUNTER — Other Ambulatory Visit: Payer: Self-pay

## 2017-11-06 ENCOUNTER — Encounter (HOSPITAL_COMMUNITY): Payer: Self-pay | Admitting: Family Medicine

## 2017-11-06 DIAGNOSIS — R51 Headache: Secondary | ICD-10-CM | POA: Diagnosis not present

## 2017-11-06 DIAGNOSIS — M329 Systemic lupus erythematosus, unspecified: Secondary | ICD-10-CM | POA: Insufficient documentation

## 2017-11-06 DIAGNOSIS — Z833 Family history of diabetes mellitus: Secondary | ICD-10-CM | POA: Insufficient documentation

## 2017-11-06 DIAGNOSIS — J111 Influenza due to unidentified influenza virus with other respiratory manifestations: Secondary | ICD-10-CM | POA: Diagnosis not present

## 2017-11-06 DIAGNOSIS — F17210 Nicotine dependence, cigarettes, uncomplicated: Secondary | ICD-10-CM | POA: Diagnosis not present

## 2017-11-06 DIAGNOSIS — M791 Myalgia, unspecified site: Secondary | ICD-10-CM | POA: Diagnosis not present

## 2017-11-06 DIAGNOSIS — F419 Anxiety disorder, unspecified: Secondary | ICD-10-CM | POA: Insufficient documentation

## 2017-11-06 DIAGNOSIS — J45909 Unspecified asthma, uncomplicated: Secondary | ICD-10-CM | POA: Insufficient documentation

## 2017-11-06 DIAGNOSIS — R0602 Shortness of breath: Secondary | ICD-10-CM

## 2017-11-06 DIAGNOSIS — Z79899 Other long term (current) drug therapy: Secondary | ICD-10-CM | POA: Insufficient documentation

## 2017-11-06 DIAGNOSIS — J029 Acute pharyngitis, unspecified: Secondary | ICD-10-CM | POA: Diagnosis not present

## 2017-11-06 DIAGNOSIS — R69 Illness, unspecified: Secondary | ICD-10-CM | POA: Diagnosis not present

## 2017-11-06 DIAGNOSIS — Z7952 Long term (current) use of systemic steroids: Secondary | ICD-10-CM | POA: Insufficient documentation

## 2017-11-06 LAB — POCT RAPID STREP A: STREPTOCOCCUS, GROUP A SCREEN (DIRECT): NEGATIVE

## 2017-11-06 MED ORDER — OSELTAMIVIR PHOSPHATE 75 MG PO CAPS: 75.0000 mg | ORAL_CAPSULE | Freq: Two times a day (BID) | ORAL | 0 refills | Status: DC

## 2017-11-06 NOTE — ED Triage Notes (Addendum)
Per pt she have lupus and she thinks she have the flu, per pt chills body aches sore throat, sob, headaches, fatigue, vomit

## 2017-11-06 NOTE — Discharge Instructions (Signed)
Get right on the Tamiflu tonight.  If you get worse or cannot tolerate Tamiflu, return to the emergency department.

## 2017-11-06 NOTE — ED Provider Notes (Signed)
[facility]   [id number] 11/06/17 Arrival Time: 1732   SUBJECTIVE:  Kelly [name] is a 22 y.o. female who presents to the urgent care with complaint of lupus and she thinks she have the flu, per pt chills body aches sore throat, sob, headaches  Patient's symptoms began with sore throat and myalgias yesterday.  Past Medical History:  Diagnosis Date  . Anxiety   . Asthma   . Chest discomfort feb 2017   Pt states due to shortness of breath  . Lupus (systemic lupus erythematosus) (HCC)    dx 2010  . Pneumonia    2015  . Shortness of breath dyspnea feb 2017   changed inhaler with relief   Family History  Problem Relation Age of Onset  . Diabetes Paternal Grandmother    Social History   Socioeconomic History  . Marital status: Single    Spouse name: Not on file  . Number of children: Not on file  . Years of education: Not on file  . Highest education level: Not on file  Social Needs  . Financial resource strain: Not on file  . Food insecurity - worry: Not on file  . Food insecurity - inability: Not on file  . Transportation needs - medical: Not on file  . Transportation needs - non-medical: Not on file  Occupational History  . Not on file  Tobacco Use  . Smoking status: Current Some Day Smoker    Types: Cigarettes  . Smokeless tobacco: Never Used  Substance and Sexual Activity  . Alcohol use: No  . Drug use: Yes    Types: Marijuana  . Sexual activity: Yes    Birth control/protection: Pill  Other Topics Concern  . Not on file  Social History Narrative  . Not on file   Current Meds  Medication Sig  . hydroxychloroquine (PLAQUENIL) 200 MG tablet Take 1 tablet (200 mg total) by mouth daily. (Patient taking differently: Take 300 mg by mouth daily. )  . [DISCONTINUED] predniSONE (DELTASONE) 20 MG tablet Take 3 tablets (60 mg total) by mouth daily.  . [DISCONTINUED] predniSONE (DELTASONE) 5 MG tablet Take 5 mg by mouth daily.    No Known  Allergies    ROS: As per HPI, remainder of ROS negative.   OBJECTIVE:   Vitals:   11/06/17 1855  BP: (!) 87/53  Pulse: (!) 110  Temp: 100.1 F (37.8 C)  TempSrc: Oral  SpO2: 100%     General appearance: alert; no distress Eyes: PERRL; EOMI; conjunctiva normal HENT: normocephalic; atraumatic; TMs normal, canal normal, external ears normal without trauma; nasal mucosa normal; oral mucosa shows hyperpigmented buccal membranes bilaterally Neck: supple Lungs: clear to auscultation bilaterally Heart: regular rate and rhythm Abdomen: soft, non-tender; bowel sounds normal; no masses or organomegaly; no guarding or rebound tenderness Back: no CVA tenderness Extremities: no cyanosis or edema; symmetrical with no gross deformities Skin: warm and dry Neurologic: normal gait; grossly normal Psychological: alert and cooperative; normal mood and affect      Labs:  Results for orders placed or performed during the hospital encounter of 04/24/17  Basic metabolic panel  Result Value Ref Range   Sodium 140 135 - 145 mmol/L   Potassium 3.7 3.5 - 5.1 mmol/L   Chloride 108 101 - 111 mmol/L   CO2 26 22 - 32 mmol/L   Glucose, Bld 95 65 - 99 mg/dL   BUN 10 6 - 20 mg/dL   Creatinine, Ser 5.78 0.44 - 1.00 mg/dL  Calcium 8.6 (L) 8.9 - 10.3 mg/dL   GFR calc non Af Amer >60 >60 mL/min   GFR calc Af Amer >60 >60 mL/min   Anion gap 6 5 - 15  CBC  Result Value Ref Range   WBC 4.8 4.0 - 10.5 K/uL   RBC 3.98 3.87 - 5.11 MIL/uL   Hemoglobin 11.6 (L) 12.0 - 15.0 g/dL   HCT 16.133.2 (L) 09.636.0 - 04.546.0 %   MCV 83.4 78.0 - 100.0 fL   MCH 29.1 26.0 - 34.0 pg   MCHC 34.9 30.0 - 36.0 g/dL   RDW 40.913.9 81.111.5 - 91.415.5 %   Platelets 247 150 - 400 K/uL  D[facility], [facility] ([facility])  Result Value Ref Range   D-Dimer, Quant 0.31 0.00 - 0.50 ug/mL-FEU  POCT i-Stat troponin I  Result Value Ref Range   Troponin i, poc 0.00 0.00 - 0.08 ng/mL   Comment 3            Labs Reviewed - No data to  display  No results found.     ASSESSMENT & PLAN:  1. Influenza-like illness     Meds ordered this encounter  Medications  . oseltamivir (TAMIFLU) 75 MG capsule    Sig: Take 1 capsule (75 mg total) by mouth every 12 (twelve) hours.    Dispense:  10 capsule    Refill:  0    Reviewed expectations re: course of current medical issues. Questions answered. Outlined signs and symptoms indicating need for more acute intervention. Patient verbalized understanding. After Visit Summary given.     Elvina SidleLauenstein, Maycen Degregory, MD 11/06/17 1907

## 2017-11-09 LAB — CULTURE, GROUP A STREP (THRC)

## 2017-12-10 DIAGNOSIS — Z3046 Encounter for surveillance of implantable subdermal contraceptive: Secondary | ICD-10-CM | POA: Diagnosis not present

## 2018-01-16 DIAGNOSIS — Z113 Encounter for screening for infections with a predominantly sexual mode of transmission: Secondary | ICD-10-CM | POA: Diagnosis not present

## 2018-01-16 DIAGNOSIS — Z114 Encounter for screening for human immunodeficiency virus [HIV]: Secondary | ICD-10-CM | POA: Diagnosis not present

## 2018-01-16 DIAGNOSIS — Z1159 Encounter for screening for other viral diseases: Secondary | ICD-10-CM | POA: Diagnosis not present

## 2018-01-16 DIAGNOSIS — Z118 Encounter for screening for other infectious and parasitic diseases: Secondary | ICD-10-CM | POA: Diagnosis not present

## 2018-01-16 DIAGNOSIS — N760 Acute vaginitis: Secondary | ICD-10-CM | POA: Diagnosis not present

## 2018-02-01 DIAGNOSIS — Z113 Encounter for screening for infections with a predominantly sexual mode of transmission: Secondary | ICD-10-CM | POA: Diagnosis not present

## 2018-02-01 DIAGNOSIS — N760 Acute vaginitis: Secondary | ICD-10-CM | POA: Diagnosis not present

## 2018-02-01 DIAGNOSIS — N898 Other specified noninflammatory disorders of vagina: Secondary | ICD-10-CM | POA: Diagnosis not present

## 2018-02-02 DIAGNOSIS — Z113 Encounter for screening for infections with a predominantly sexual mode of transmission: Secondary | ICD-10-CM | POA: Diagnosis not present

## 2018-02-05 DIAGNOSIS — L932 Other local lupus erythematosus: Secondary | ICD-10-CM | POA: Diagnosis not present

## 2018-02-05 DIAGNOSIS — Z01419 Encounter for gynecological examination (general) (routine) without abnormal findings: Secondary | ICD-10-CM | POA: Diagnosis not present

## 2018-02-05 DIAGNOSIS — N898 Other specified noninflammatory disorders of vagina: Secondary | ICD-10-CM | POA: Diagnosis not present

## 2018-02-05 DIAGNOSIS — M064 Inflammatory polyarthropathy: Secondary | ICD-10-CM | POA: Diagnosis not present

## 2018-02-05 DIAGNOSIS — M2550 Pain in unspecified joint: Secondary | ICD-10-CM | POA: Diagnosis not present

## 2018-02-05 DIAGNOSIS — Z6821 Body mass index (BMI) 21.0-21.9, adult: Secondary | ICD-10-CM | POA: Diagnosis not present

## 2018-02-05 DIAGNOSIS — M329 Systemic lupus erythematosus, unspecified: Secondary | ICD-10-CM | POA: Diagnosis not present

## 2018-02-05 DIAGNOSIS — R102 Pelvic and perineal pain: Secondary | ICD-10-CM | POA: Diagnosis not present

## 2018-02-27 IMAGING — CR DG CHEST 2V
2 series · 2 of 2 positions shown · non-contrast
Comparison: 04/06/2016

CLINICAL DATA: Chest pain and cough

EXAM:
CHEST  2 VIEW

[chest pa]
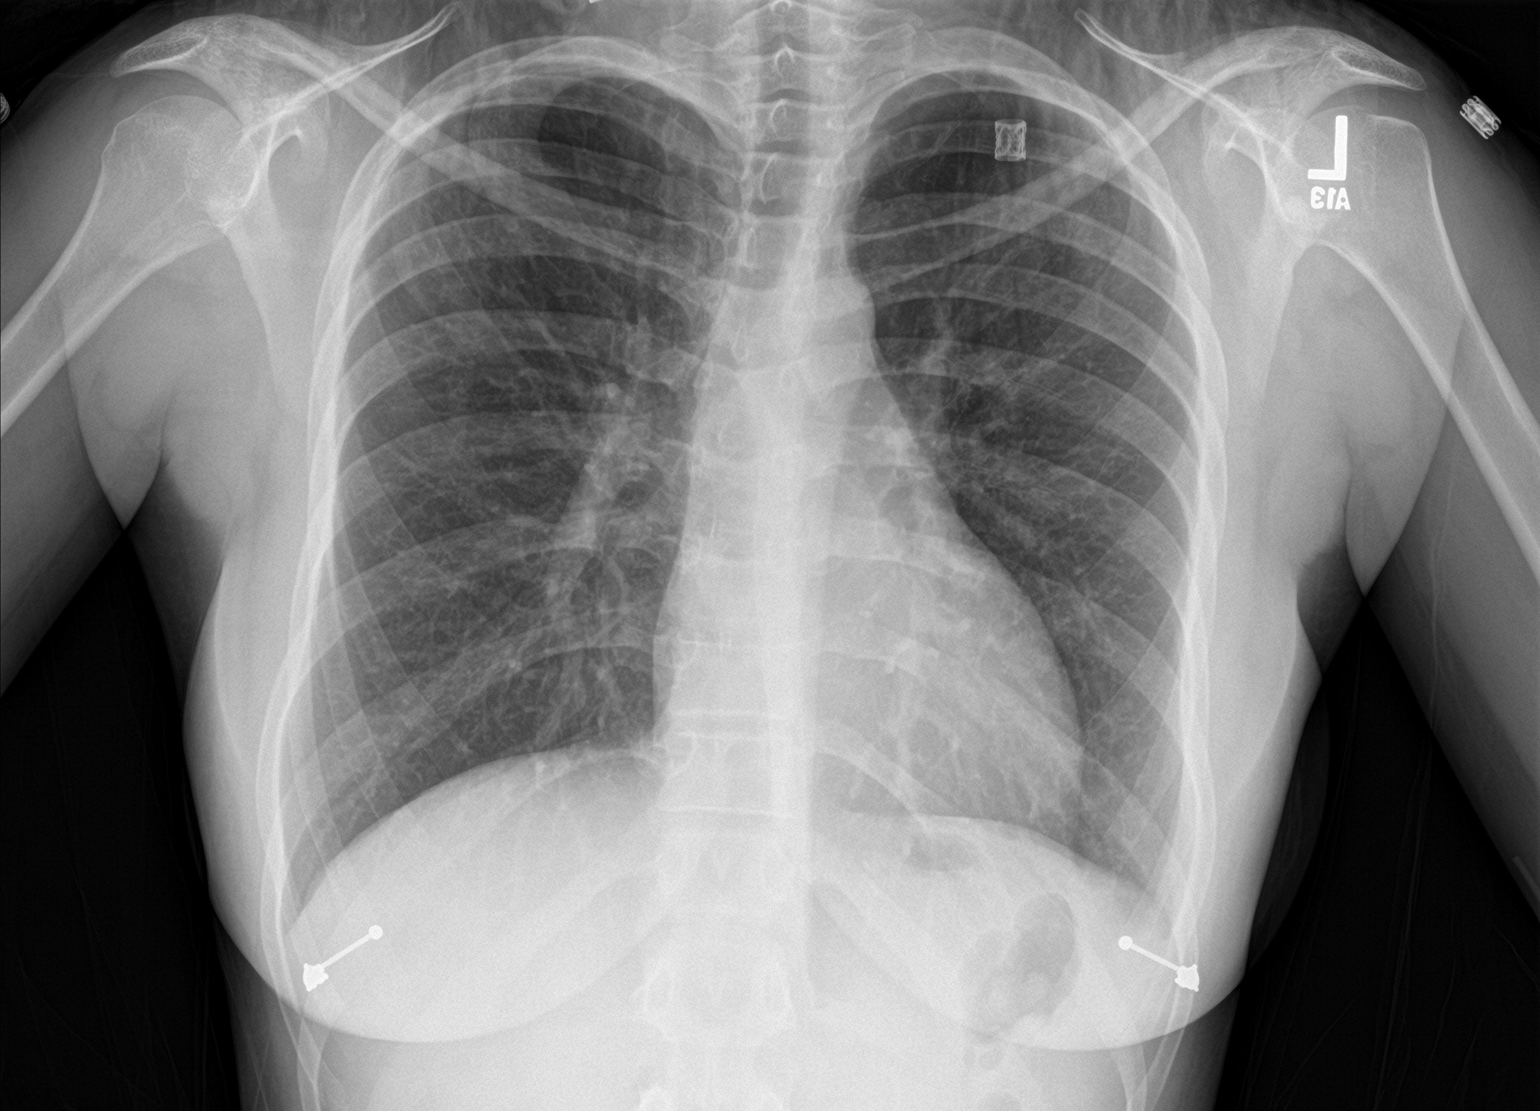

[chest lat]
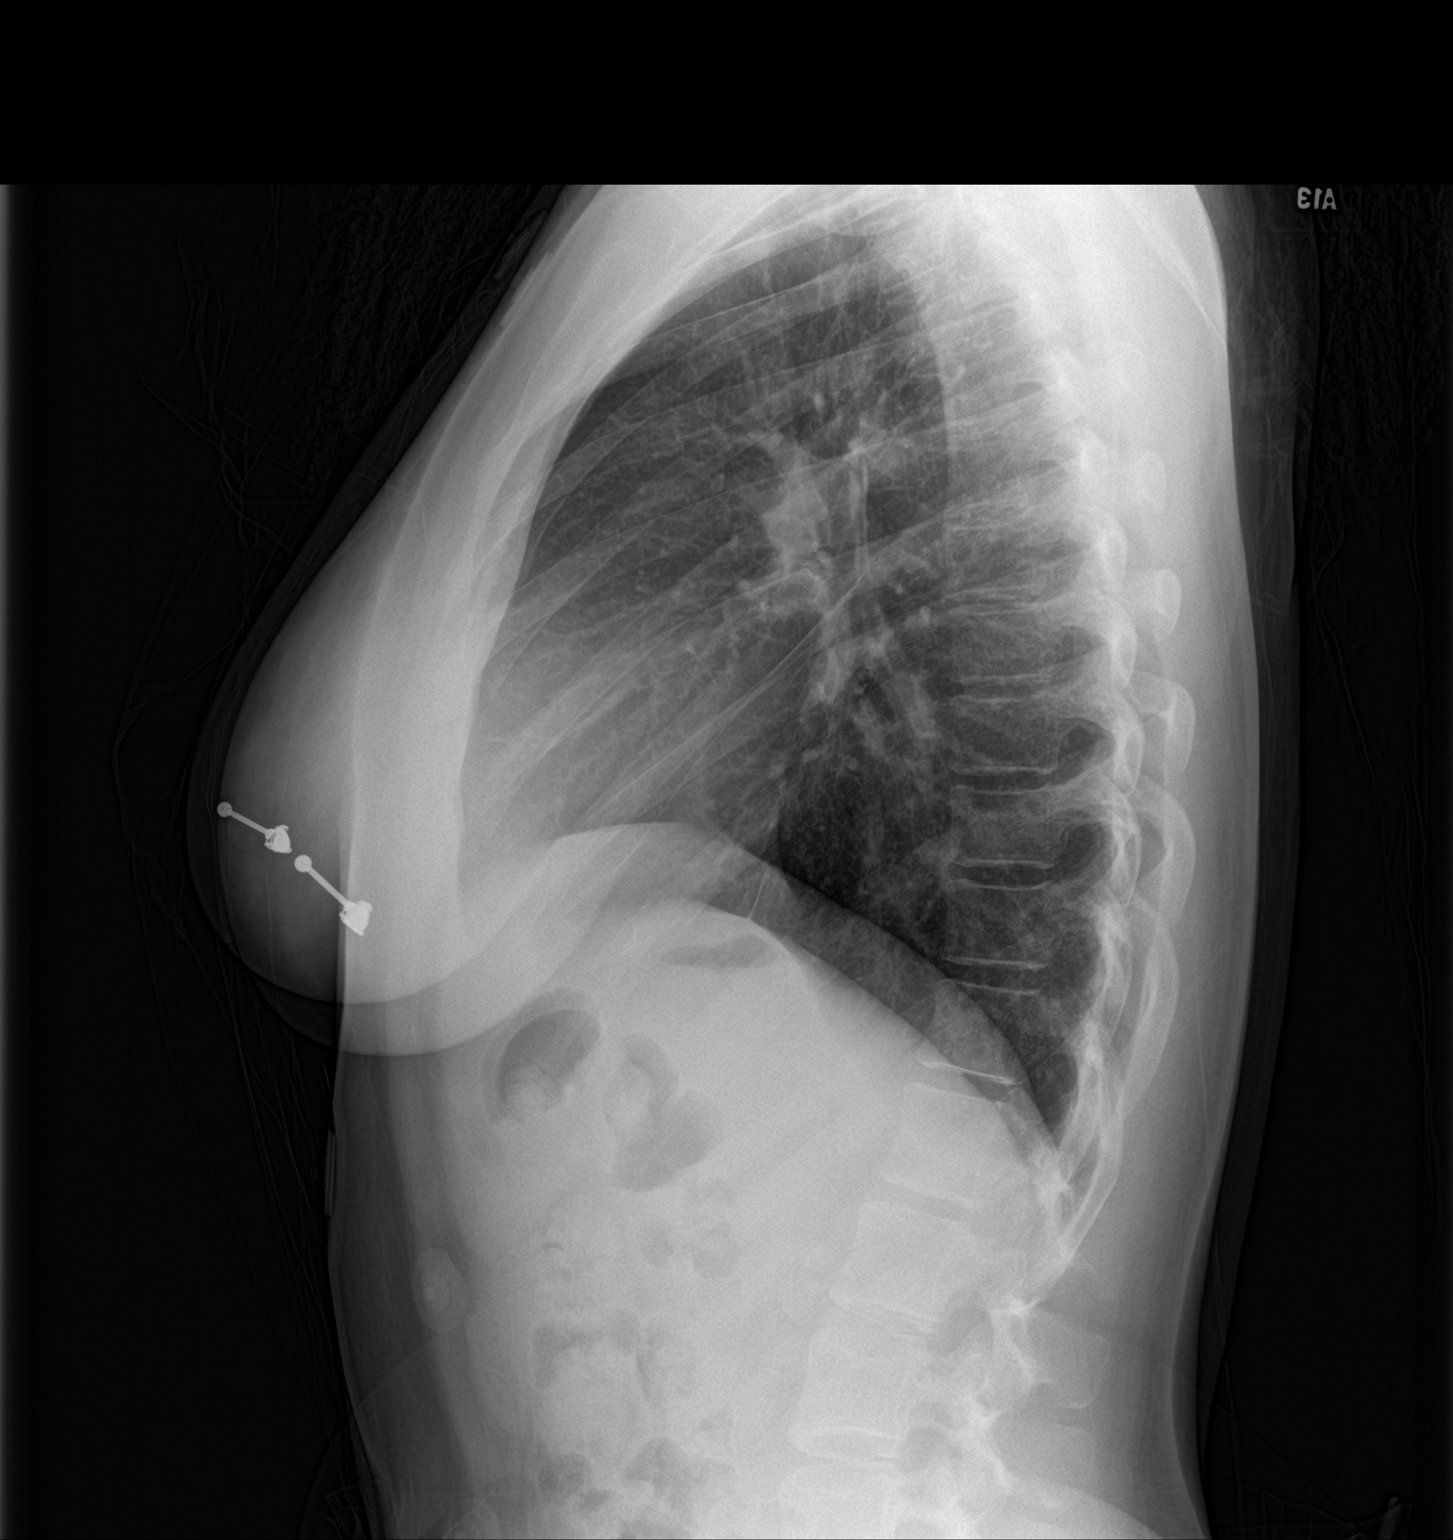

[2 of 2 positions shown; findings below may reference images not displayed]

FINDINGS: The heart size and mediastinal contours are within normal limits.
Both lungs are clear. The visualized skeletal structures are
unremarkable.
IMPRESSION: No active cardiopulmonary disease.

## 2018-02-28 DIAGNOSIS — R030 Elevated blood-pressure reading, without diagnosis of hypertension: Secondary | ICD-10-CM | POA: Diagnosis not present

## 2018-02-28 DIAGNOSIS — N760 Acute vaginitis: Secondary | ICD-10-CM | POA: Diagnosis not present

## 2018-03-04 DIAGNOSIS — B373 Candidiasis of vulva and vagina: Secondary | ICD-10-CM | POA: Diagnosis not present

## 2018-03-04 DIAGNOSIS — N760 Acute vaginitis: Secondary | ICD-10-CM | POA: Diagnosis not present

## 2018-03-25 DIAGNOSIS — N898 Other specified noninflammatory disorders of vagina: Secondary | ICD-10-CM | POA: Diagnosis not present

## 2018-04-29 DIAGNOSIS — N898 Other specified noninflammatory disorders of vagina: Secondary | ICD-10-CM | POA: Diagnosis not present

## 2018-04-29 DIAGNOSIS — Z3169 Encounter for other general counseling and advice on procreation: Secondary | ICD-10-CM | POA: Diagnosis not present

## 2018-05-15 DIAGNOSIS — Z01419 Encounter for gynecological examination (general) (routine) without abnormal findings: Secondary | ICD-10-CM | POA: Diagnosis not present

## 2018-05-15 DIAGNOSIS — B009 Herpesviral infection, unspecified: Secondary | ICD-10-CM | POA: Diagnosis not present

## 2018-08-28 ENCOUNTER — Encounter (HOSPITAL_COMMUNITY): Payer: Self-pay | Admitting: *Deleted

## 2018-08-28 ENCOUNTER — Inpatient Hospital Stay (HOSPITAL_COMMUNITY)
Admission: AD | Admit: 2018-08-28 | Discharge: 2018-08-28 | Disposition: A | Discharge status: Home / Self Care | Payer: BLUE CROSS/BLUE SHIELD | Attending: Obstetrics and Gynecology | Admitting: Obstetrics and Gynecology

## 2018-08-28 ENCOUNTER — Inpatient Hospital Stay (HOSPITAL_COMMUNITY): Payer: BLUE CROSS/BLUE SHIELD

## 2018-08-28 DIAGNOSIS — R102 Pelvic and perineal pain: Secondary | ICD-10-CM | POA: Diagnosis present

## 2018-08-28 DIAGNOSIS — Z3A01 Less than 8 weeks gestation of pregnancy: Secondary | ICD-10-CM

## 2018-08-28 DIAGNOSIS — O208 Other hemorrhage in early pregnancy: Secondary | ICD-10-CM | POA: Diagnosis not present

## 2018-08-28 DIAGNOSIS — R52 Pain, unspecified: Secondary | ICD-10-CM

## 2018-08-28 DIAGNOSIS — O468X1 Other antepartum hemorrhage, first trimester: Secondary | ICD-10-CM | POA: Diagnosis not present

## 2018-08-28 DIAGNOSIS — Z87891 Personal history of nicotine dependence: Secondary | ICD-10-CM | POA: Diagnosis not present

## 2018-08-28 DIAGNOSIS — O418X10 Other specified disorders of amniotic fluid and membranes, first trimester, not applicable or unspecified: Secondary | ICD-10-CM | POA: Diagnosis not present

## 2018-08-28 DIAGNOSIS — O209 Hemorrhage in early pregnancy, unspecified: Secondary | ICD-10-CM

## 2018-08-28 LAB — URINALYSIS, ROUTINE W REFLEX MICROSCOPIC
BACTERIA UA: NONE SEEN
BILIRUBIN URINE: NEGATIVE
Glucose, UA: NEGATIVE mg/dL
KETONES UR: NEGATIVE mg/dL
LEUKOCYTES UA: NEGATIVE
Nitrite: NEGATIVE
PH: 7 (ref 5.0–8.0)
Protein, ur: NEGATIVE mg/dL
Specific Gravity, Urine: 1.016 (ref 1.005–1.030)

## 2018-08-28 LAB — HCG, QUANTITATIVE, PREGNANCY: hCG, Beta Chain, Quant, S: 44867 m[IU]/mL — ABNORMAL HIGH (ref <5)

## 2018-08-28 LAB — CBC
HEMATOCRIT: 31.6 % — AB (ref 36.0–46.0)
Hemoglobin: 10.5 g/dL — ABNORMAL LOW (ref 12.0–15.0)
MCH: 28.6 pg (ref 26.0–34.0)
MCHC: 33.2 g/dL (ref 30.0–36.0)
MCV: 86.1 fL (ref 80.0–100.0)
Platelets: 260 10*3/uL (ref 150–400)
RBC: 3.67 MIL/uL — ABNORMAL LOW (ref 3.87–5.11)
RDW: 13.7 % (ref 11.5–15.5)
WBC: 4.8 10*3/uL (ref 4.0–10.5)
nRBC: 0 % (ref 0.0–0.2)

## 2018-08-28 LAB — ABO/RH: ABO/RH(D): B POS

## 2018-08-28 LAB — POCT PREGNANCY, URINE: Preg Test, Ur: POSITIVE — AB

## 2018-08-28 NOTE — MAU Note (Signed)
Pt C/O mild cramping for the last 2-3 weeks, went to [facility] yesterday, noted bright pink/red bleeding, hasn't seen bleeding today.  Unable to be seen in office today so came to [facility]U.  Had pos HPT & UPT in [place] office.

## 2018-08-28 NOTE — MAU Provider Note (Signed)
History     CSN: [id number]  Arrival date and time: 08/28/18 1223   None     Chief Complaint  Patient presents with  . Abdominal Pain  . Vaginal Bleeding   Kelly [name] is a 23 y.o. G1P0 at 102108w4d who presents today with pelvic pain and spotting. She is planning on going to [facility] for prenatal care.   Vaginal Pain  The patient's primary symptoms include pelvic pain and vaginal bleeding. This is a new problem. The current episode started yesterday. The problem occurs intermittently. The problem has been unchanged. Pain severity now: 4/10. The problem affects both sides. She is pregnant. Pertinent negatives include no chills, fever, nausea or vomiting. The vaginal discharge was bloody. The vaginal bleeding is spotting (seen with wiping yesterday, but none today ). She has not been passing clots. She has not been passing tissue. Nothing aggravates the symptoms. She has tried acetaminophen for the symptoms. The treatment provided mild relief. She is sexually active. She uses nothing for contraception. Her menstrual history has been regular (LMP 07/13/18 ). (Lupus )    OB History    Gravida  1   Para      Term      Preterm      AB      Living        SAB      TAB      Ectopic      Multiple      Live Births              Past Medical History:  Diagnosis Date  . Anxiety   . Asthma   . Chest discomfort feb 2017   Pt states due to shortness of breath  . Lupus (systemic lupus erythematosus) (HCC)    dx 2010  . Pneumonia    2015  . Shortness of breath dyspnea feb 2017   changed inhaler with relief    Past Surgical History:  Procedure Laterality Date  . WISDOM TOOTH EXTRACTION  2014    Family History  Problem Relation Age of Onset  . Diabetes Paternal Grandmother     Social History   Tobacco Use  . Smoking status: Former Smoker    Types: Cigarettes  . Smokeless tobacco: Never Used  Substance Use Topics  . Alcohol use: Not Currently  . Drug  use: Not Currently    Types: Marijuana    Allergies: No Known Allergies  Medications Prior to Admission  Medication Sig Dispense Refill Last Dose  . acetaminophen (TYLENOL) 500 MG tablet Take 1,000 mg by mouth every 6 (six) hours as needed for moderate pain or headache.   04/24/2017 at Unknown time  . albuterol (PROVENTIL HFA;VENTOLIN HFA) 108 (90 Base) MCG/ACT inhaler Inhale 2 puffs into the lungs every 4 (four) hours as needed for wheezing or shortness of breath. 1 Inhaler 0 04/24/2017 at Unknown time  . albuterol (PROVENTIL HFA;VENTOLIN HFA) 108 (90 Base) MCG/ACT inhaler Inhale 1-2 puffs into the lungs every 6 (six) hours as needed for wheezing or shortness of breath. 1 Inhaler 0   . Belimumab (BENLYSTA IV) Inject 200 mg into the vein once a week.   04/21/2017  . BENLYSTA 200 MG/ML SOAJ 200 mg once a week.  2 04/21/2017  . etonogestrel (NEXPLANON) 68 MG IMPL implant 1 each by Subdermal route once. Implanted in left upper arm.  Placed February 27, 2016   04/24/2017 at Unknown time  . hydroxychloroquine (PLAQUENIL) 200 MG tablet  Take 1 tablet (200 mg total) by mouth daily. (Patient taking differently: Take 300 mg by mouth daily. ) 60 tablet 1 11/06/2017 at Unknown time  . oseltamivir (TAMIFLU) 75 MG capsule Take 1 capsule (75 mg total) by mouth every 12 (twelve) hours. 10 capsule 0   . polyvinyl alcohol (LIQUIFILM TEARS) 1.4 % ophthalmic solution Place 1 drop into both eyes 3 (three) times daily as needed for dry eyes.   Past Week at Unknown time  . vitamin B-12 (CYANOCOBALAMIN) 1000 MCG tablet Take 1 tablet (1,000 mcg total) by mouth daily. (Patient not taking: Reported on 04/24/2017) 90 tablet 2 Not Taking at Unknown time    Review of Systems  Constitutional: Negative for chills and fever.  Gastrointestinal: Negative for nausea and vomiting.  Genitourinary: Positive for pelvic pain and vaginal pain.   Physical Exam   Blood pressure 117/71, pulse 90, temperature 98.1 F (36.7 C), temperature source  Oral, resp. rate 16, height 5\' 2"  (1.575 m), weight 53.5 kg, last menstrual period 07/13/2018.  Physical Exam  Nursing note and vitals reviewed. Constitutional: She is oriented to person, place, and time. She appears well-developed and well-nourished. No distress.  HENT:  Head: Normocephalic.  Cardiovascular: Normal rate.  Respiratory: Effort normal.  GI: Soft.  Neurological: She is alert and oriented to person, place, and time.  Skin: Skin is warm and dry.  Psychiatric: She has a normal mood and affect.     Results for orders placed or performed during the hospital encounter of 08/28/18 (from the past 24 hour(s))  Urinalysis, Routine w reflex microscopic     Status: Abnormal   Collection Time: 08/28/18 12:39 PM  Result Value Ref Range   Color, Urine YELLOW YELLOW   APPearance HAZY (A) CLEAR   Specific Gravity, Urine 1.016 1.005 - 1.030   pH 7.0 5.0 - 8.0   Glucose, UA NEGATIVE NEGATIVE mg/dL   Hgb urine dipstick SMALL (A) NEGATIVE   Bilirubin Urine NEGATIVE NEGATIVE   Ketones, ur NEGATIVE NEGATIVE mg/dL   Protein, ur NEGATIVE NEGATIVE mg/dL   Nitrite NEGATIVE NEGATIVE   Leukocytes, UA NEGATIVE NEGATIVE   RBC / HPF 0-5 0 - 5 RBC/hpf   WBC, UA 0-5 0 - 5 WBC/hpf   Bacteria, UA NONE SEEN NONE SEEN   Squamous Epithelial / LPF 0-5 0 - 5   Mucus PRESENT   Pregnancy, urine POC     Status: Abnormal   Collection Time: 08/28/18 12:42 PM  Result Value Ref Range   Preg Test, Ur POSITIVE (A) NEGATIVE  CBC     Status: Abnormal   Collection Time: 08/28/18  1:12 PM  Result Value Ref Range   WBC 4.8 4.0 - 10.5 K/uL   RBC 3.67 (L) 3.87 - 5.11 MIL/uL   Hemoglobin 10.5 (L) 12.0 - 15.0 g/dL   HCT 69.631.6 (L) 29.536.0 - 28.446.0 %   MCV 86.1 80.0 - 100.0 fL   MCH 28.6 26.0 - 34.0 pg   MCHC 33.2 30.0 - 36.0 g/dL   RDW 13.213.7 44.011.5 - 10.215.5 %   Platelets 260 150 - 400 K/uL   nRBC 0.0 0.0 - 0.2 %  ABO/Rh     Status: None (Preliminary result)   Collection Time: 08/28/18  1:12 PM  Result Value Ref Range    ABO/RH(D)      B POS Performed at [facility], [address] [place], [place] [id number]    [place] Ob Less Than 14 Weeks With Ob Transvaginal  Result Date:  08/28/2018 CLINICAL DATA:  Pregnant, pain EXAM: OBSTETRIC <14 WK [place] AND TRANSVAGINAL OB US TECHNIQUE: Both transabdominal and transvaginal ultrasound examinations were performed for complete evaluation of the gestation as well as the maternal uterus, adnexal regions, and pelvic cul-de-sac. Transvaginal technique was performed to assess early pregnancy. COMPARISON:  07/18/2016 FINDINGS: Intrauterine gestational sac: Single Yolk sac:  Visualized. Embryo:  Visualized. Cardiac Activity: Visualized. Heart Rate: 112 bpm CRL:  5.0 mm   6 w   1 d                  [place] EDC: 04/22/2019 Subchorionic hemorrhage:  Small subchronic hemorrhage. Maternal uterus/adnexae: Bilateral ovaries are within normal limits. Trace pelvic ascites. IMPRESSION: Single live intrauterine gestation, with estimated gestational age [redacted] weeks 1 day by crown-rump length, as above. Electronically Signed   By: Charline Bills M.D.   On: 08/28/2018 14:03    MAU Course  Procedures  MDM   Assessment and Plan   1. Vaginal bleeding in pregnancy, first trimester   2. Pain   3. [redacted] weeks gestation of pregnancy   4. Subchorionic hematoma in first trimester, single or unspecified fetus     Blood type: B+    DC home Comfort measures reviewed  3rd Trimester precautions  Bleeding precautions RX: none, continue current medications as prescribed  Return to MAU as needed FU with OB as planned  Follow-up Information    Maxie Better, MD Follow up.   Specialty:  Obstetrics and Gynecology Contact information: [address] [place] [address] [phone]            Thressa Sheller DNP, CNM  08/28/18  2:27 PM

## 2018-08-28 NOTE — Discharge Instructions (Signed)
Subchorionic Hematoma ° °A subchorionic hematoma is a gathering of blood between the outer wall of the embryo (chorion) and the inner wall of the womb (uterus). °This condition can cause vaginal bleeding. If they cause little or no vaginal bleeding, early small hematomas usually shrink on their own and do not affect your baby or pregnancy. When bleeding starts later in pregnancy, or if the hematoma is larger or occurs in older pregnant women, the condition may be more serious. Larger hematomas may get bigger, which increases the chances of miscarriage. This condition also increases the risk of: °· Premature separation of the placenta from the uterus. °· Premature (preterm) labor. °· Stillbirth. °What are the causes? °The exact cause of this condition is not known. It occurs when blood is trapped between the placenta and the uterine wall because the placenta has separated from the original site of implantation. °What increases the risk? °You are more likely to develop this condition if: °· You were treated with fertility medicines. °· You conceived through in vitro fertilization (IVF). °What are the signs or symptoms? °Symptoms of this condition include: °· Vaginal spotting or bleeding. °· Contractions of the uterus. These cause abdominal pain. °Sometimes you may have no symptoms and the bleeding may only be seen when ultrasound images are taken (transvaginal ultrasound). °How is this diagnosed? °This condition is diagnosed based on a physical exam. This includes a pelvic exam. You may also have other tests, including: °· Blood tests. °· Urine tests. °· Ultrasound of the abdomen. °How is this treated? °Treatment for this condition can vary. Treatment may include: °· Watchful waiting. You will be monitored closely for any changes in bleeding. During this stage: °? The hematoma may be reabsorbed by the body. °? The hematoma may separate the fluid-filled space containing the embryo (gestational sac) from the wall of the  womb (endometrium). °· Medicines. °· Activity restriction. This may be needed until the bleeding stops. °Follow these instructions at home: °· Stay on bed rest if told to do so by your health care provider. °· Do not lift anything that is heavier than 10 lbs. (4.5 kg) or as told by your health care provider. °· Do not use any products that contain nicotine or tobacco, such as cigarettes and e-cigarettes. If you need help quitting, ask your health care provider. °· Track and write down the number of pads you use each day and how soaked (saturated) they are. °· Do not use tampons. °· Keep all follow-up visits as told by your health care provider. This is important. Your health care provider may ask you to have follow-up blood tests or ultrasound tests or both. °Contact a health care provider if: °· You have any vaginal bleeding. °· You have a fever. °Get help right away if: °· You have severe cramps in your stomach, back, abdomen, or pelvis. °· You pass large clots or tissue. Save any tissue for your health care provider to look at. °· You have more vaginal bleeding, and you faint or become lightheaded or weak. °Summary °· A subchorionic hematoma is a gathering of blood between the outer wall of the placenta and the uterus. °· This condition can cause vaginal bleeding. °· Sometimes you may have no symptoms and the bleeding may only be seen when ultrasound images are taken. °· Treatment may include watchful waiting, medicines, or activity restriction. °This information is not intended to replace advice given to you by your health care provider. Make sure you discuss any questions you   have with your health care provider. °Document Released: 11/28/2006 Document Revised: 10/09/2016 Document Reviewed: 10/09/2016 °Elsevier Interactive Patient Education © 2019 Elsevier Inc. ° °

## 2018-09-01 DIAGNOSIS — Z3201 Encounter for pregnancy test, result positive: Secondary | ICD-10-CM | POA: Diagnosis not present

## 2018-09-08 DIAGNOSIS — O161 Unspecified maternal hypertension, first trimester: Secondary | ICD-10-CM | POA: Diagnosis not present

## 2018-09-08 DIAGNOSIS — Z3A08 8 weeks gestation of pregnancy: Secondary | ICD-10-CM | POA: Diagnosis not present

## 2018-09-22 DIAGNOSIS — M064 Inflammatory polyarthropathy: Secondary | ICD-10-CM | POA: Diagnosis not present

## 2018-09-22 DIAGNOSIS — M329 Systemic lupus erythematosus, unspecified: Secondary | ICD-10-CM | POA: Diagnosis not present

## 2018-09-22 DIAGNOSIS — L932 Other local lupus erythematosus: Secondary | ICD-10-CM | POA: Diagnosis not present

## 2018-09-26 ENCOUNTER — Encounter (HOSPITAL_COMMUNITY): Payer: Self-pay | Admitting: *Deleted

## 2018-09-26 ENCOUNTER — Inpatient Hospital Stay (HOSPITAL_COMMUNITY)
Admission: AD | Admit: 2018-09-26 | Discharge: 2018-09-26 | Disposition: A | Discharge status: Home / Self Care | Payer: BLUE CROSS/BLUE SHIELD | Source: Ambulatory Visit | Attending: Obstetrics and Gynecology | Admitting: Obstetrics and Gynecology

## 2018-09-26 DIAGNOSIS — B349 Viral infection, unspecified: Secondary | ICD-10-CM | POA: Diagnosis not present

## 2018-09-26 DIAGNOSIS — O98511 Other viral diseases complicating pregnancy, first trimester: Secondary | ICD-10-CM | POA: Diagnosis not present

## 2018-09-26 DIAGNOSIS — R0602 Shortness of breath: Secondary | ICD-10-CM | POA: Diagnosis not present

## 2018-09-26 DIAGNOSIS — R05 Cough: Secondary | ICD-10-CM | POA: Diagnosis not present

## 2018-09-26 DIAGNOSIS — Z3A10 10 weeks gestation of pregnancy: Secondary | ICD-10-CM | POA: Diagnosis not present

## 2018-09-26 DIAGNOSIS — Z87891 Personal history of nicotine dependence: Secondary | ICD-10-CM | POA: Insufficient documentation

## 2018-09-26 DIAGNOSIS — R059 Cough, unspecified: Secondary | ICD-10-CM

## 2018-09-26 MED ORDER — ALBUTEROL SULFATE HFA 108 (90 BASE) MCG/ACT IN AERS: 1.0000 | INHALATION_SPRAY | Freq: Four times a day (QID) | RESPIRATORY_TRACT | 0 refills | Status: DC | PRN

## 2018-09-26 MED ORDER — PREDNISONE 50 MG PO TABS: ORAL_TABLET | ORAL | 0 refills | Status: AC

## 2018-09-26 NOTE — MAU Provider Note (Signed)
History     CSN: [id number]  Arrival date and time: 09/26/18 1625   First Provider Initiated Contact with Patient 09/26/18 1656      Chief Complaint  Patient presents with  . Cough  . Shortness of Breath   Kelly [name] is 23 y.o. G1P0 at 7740w5d who presents today with cough and shortness of breath x 2 weeks. She has a history of asthma, and used her inhaler once today. She states that it did not help. She also tried mucinex once today as well without releif. She denies any chest pain. She states that the shortness of breath occurs with coughing episodes.  Cough  This is a new problem. Episode onset: 2 weeks  The problem has been unchanged. The cough is productive of sputum. Associated symptoms include shortness of breath. Pertinent negatives include no chills or fever. Nothing (worse in the morning ) aggravates the symptoms. She has tried a beta-agonist inhaler (mucinex ) for the symptoms. The treatment provided no relief.    OB History    Gravida  1   Para      Term      Preterm      AB      Living        SAB      TAB      Ectopic      Multiple      Live Births              Past Medical History:  Diagnosis Date  . Anxiety   . Asthma   . Chest discomfort feb 2017   Pt states due to shortness of breath  . Lupus (systemic lupus erythematosus) (HCC)    dx 2010  . Pneumonia    2015  . Shortness of breath dyspnea feb 2017   changed inhaler with relief    Past Surgical History:  Procedure Laterality Date  . WISDOM TOOTH EXTRACTION  2014    Family History  Problem Relation Age of Onset  . Diabetes Paternal Grandmother     Social History   Tobacco Use  . Smoking status: Former Smoker    Types: Cigarettes  . Smokeless tobacco: Never Used  Substance Use Topics  . Alcohol use: Not Currently  . Drug use: Not Currently    Types: Marijuana    Allergies: No Known Allergies  Medications Prior to Admission  Medication Sig Dispense Refill Last  Dose  . acetaminophen (TYLENOL) 500 MG tablet Take 1,000 mg by mouth every 6 (six) hours as needed for moderate pain or headache.   04/24/2017 at Unknown time  . albuterol (PROVENTIL HFA;VENTOLIN HFA) 108 (90 Base) MCG/ACT inhaler Inhale 2 puffs into the lungs every 4 (four) hours as needed for wheezing or shortness of breath. 1 Inhaler 0 04/24/2017 at Unknown time  . albuterol (PROVENTIL HFA;VENTOLIN HFA) 108 (90 Base) MCG/ACT inhaler Inhale 1-2 puffs into the lungs every 6 (six) hours as needed for wheezing or shortness of breath. 1 Inhaler 0   . Belimumab (BENLYSTA IV) Inject 200 mg into the vein once a week.   04/21/2017  . BENLYSTA 200 MG/ML SOAJ 200 mg once a week.  2 04/21/2017  . hydroxychloroquine (PLAQUENIL) 200 MG tablet Take 1 tablet (200 mg total) by mouth daily. (Patient taking differently: Take 300 mg by mouth daily. ) 60 tablet 1 11/06/2017 at Unknown time  . oseltamivir (TAMIFLU) 75 MG capsule Take 1 capsule (75 mg total) by mouth every 12 (twelve)  hours. 10 capsule 0   . polyvinyl alcohol (LIQUIFILM TEARS) 1.4 % ophthalmic solution Place 1 drop into both eyes 3 (three) times daily as needed for dry eyes.   Past Week at Unknown time  . vitamin B-12 (CYANOCOBALAMIN) 1000 MCG tablet Take 1 tablet (1,000 mcg total) by mouth daily. (Patient not taking: Reported on 04/24/2017) 90 tablet 2 Not Taking at Unknown time    Review of Systems  Constitutional: Negative for chills and fever.  Respiratory: Positive for cough and shortness of breath.   Gastrointestinal: Negative for nausea and vomiting.  Genitourinary: Negative for pelvic pain, vaginal bleeding and vaginal discharge.   Physical Exam   Blood pressure 115/73, pulse (!) 102, temperature 98.7 F (37.1 C), resp. rate 18, last menstrual period 07/13/2018, SpO2 100 %.  Physical Exam  Nursing note and vitals reviewed. Constitutional: She is oriented to person, place, and time. She appears well-developed and well-nourished. No distress.   HENT:  Head: Normocephalic.  Cardiovascular: Normal rate.  Respiratory: Effort normal.  GI: Soft. There is no abdominal tenderness. There is no rebound.  Neurological: She is alert and oriented to person, place, and time.  Skin: Skin is warm.  Psychiatric: She has a normal mood and affect.    MAU Course  Procedures  MDM Lungs clear. No respiratory distress at this time. O2 sat 100%.   Assessment and Plan   1. Cough   2. Viral illness    DC home Comfort measures reviewed  1stTrimester precautions  RX: prednisone 50mg  QD x 5 day, albuterol inhaler refilled for the patient Return to MAU as needed FU with OB as planned  Follow-up Information    [facility] Follow up.   Contact information: [address] [place] [id number] [phone]

## 2018-09-26 NOTE — MAU Note (Signed)
Urine sent to lab 

## 2018-09-26 NOTE — Discharge Instructions (Signed)
Cough, Adult  Coughing is a reflex that clears your throat and your airways. Coughing helps to heal and protect your lungs. It is normal to cough occasionally, but a cough that happens with other symptoms or lasts a long time may be a sign of a condition that needs treatment. A cough may last only 2-3 weeks (acute), or it may last longer than 8 weeks (chronic). What are the causes? Coughing is commonly caused by:  Breathing in substances that irritate your lungs.  A viral or bacterial respiratory infection.  Allergies.  Asthma.  Postnasal drip.  Smoking.  Acid backing up from the stomach into the esophagus (gastroesophageal reflux).  Certain medicines.  Chronic lung problems, including COPD (or rarely, lung cancer).  Other medical conditions such as heart failure. Follow these instructions at home: Pay attention to any changes in your symptoms. Take these actions to help with your discomfort:  Take medicines only as told by your health care provider. ? If you were prescribed an antibiotic medicine, take it as told by your health care provider. Do not stop taking the antibiotic even if you start to feel better. ? Talk with your health care provider before you take a cough suppressant medicine.  Drink enough fluid to keep your urine clear or pale yellow.  If the air is dry, use a cold steam vaporizer or humidifier in your bedroom or your home to help loosen secretions.  Avoid anything that causes you to cough at work or at home.  If your cough is worse at night, try sleeping in a semi-upright position.  Avoid cigarette smoke. If you smoke, quit smoking. If you need help quitting, ask your health care provider.  Avoid caffeine.  Avoid alcohol.  Rest as needed. Contact a health care provider if:  You have new symptoms.  You cough up pus.  Your cough does not get better after 2-3 weeks, or your cough gets worse.  You cannot control your cough with suppressant  medicines and you are losing sleep.  You develop pain that is getting worse or pain that is not controlled with pain medicines.  You have a fever.  You have unexplained weight loss.  You have night sweats. Get help right away if:  You cough up blood.  You have difficulty breathing.  Your heartbeat is very fast. This information is not intended to replace advice given to you by your health care provider. Make sure you discuss any questions you have with your health care provider. Document Released: 02/09/2011 Document Revised: 01/19/2016 Document Reviewed: 10/20/2014 Elsevier Interactive Patient Education  2019 Elsevier Inc.  

## 2018-09-26 NOTE — MAU Note (Signed)
Pt stated she started having a productive cough 2 weeks ago. Still having a cough and congestion coughing up clear mucus.. Had Pneumonia 2 years ago felt like she might have it again a few days ago but symptoms are improving over the past 2 days. Unsure if she ever had a fever over the past 2 weeks. Feels SOB  At times.

## 2018-10-01 ENCOUNTER — Ambulatory Visit (INDEPENDENT_AMBULATORY_CARE_PROVIDER_SITE_OTHER): Payer: BLUE CROSS/BLUE SHIELD | Admitting: Certified Nurse Midwife

## 2018-10-01 ENCOUNTER — Encounter: Payer: Self-pay | Admitting: Certified Nurse Midwife

## 2018-10-01 VITALS — BP 121/79 | HR 99 | Wt 125.0 lb

## 2018-10-01 DIAGNOSIS — Z113 Encounter for screening for infections with a predominantly sexual mode of transmission: Secondary | ICD-10-CM

## 2018-10-01 DIAGNOSIS — B9689 Other specified bacterial agents as the cause of diseases classified elsewhere: Secondary | ICD-10-CM

## 2018-10-01 DIAGNOSIS — Z3401 Encounter for supervision of normal first pregnancy, first trimester: Secondary | ICD-10-CM

## 2018-10-01 DIAGNOSIS — Z3A11 11 weeks gestation of pregnancy: Secondary | ICD-10-CM

## 2018-10-01 DIAGNOSIS — Z124 Encounter for screening for malignant neoplasm of cervix: Secondary | ICD-10-CM | POA: Diagnosis not present

## 2018-10-01 DIAGNOSIS — Z23 Encounter for immunization: Secondary | ICD-10-CM | POA: Diagnosis not present

## 2018-10-01 DIAGNOSIS — N898 Other specified noninflammatory disorders of vagina: Secondary | ICD-10-CM

## 2018-10-01 DIAGNOSIS — D509 Iron deficiency anemia, unspecified: Secondary | ICD-10-CM

## 2018-10-01 DIAGNOSIS — Z3400 Encounter for supervision of normal first pregnancy, unspecified trimester: Secondary | ICD-10-CM

## 2018-10-01 DIAGNOSIS — O9989 Other specified diseases and conditions complicating pregnancy, childbirth and the puerperium: Secondary | ICD-10-CM

## 2018-10-01 DIAGNOSIS — N760 Acute vaginitis: Secondary | ICD-10-CM | POA: Diagnosis not present

## 2018-10-01 DIAGNOSIS — O99019 Anemia complicating pregnancy, unspecified trimester: Secondary | ICD-10-CM

## 2018-10-01 DIAGNOSIS — O99012 Anemia complicating pregnancy, second trimester: Secondary | ICD-10-CM

## 2018-10-01 DIAGNOSIS — M329 Systemic lupus erythematosus, unspecified: Secondary | ICD-10-CM

## 2018-10-01 MED ORDER — VITAFOL FE+ 90-1-200 & 50 MG PO CPPK: 1.0000 | ORAL_CAPSULE | Freq: Every day | ORAL | 5 refills | Status: DC

## 2018-10-01 NOTE — Progress Notes (Signed)
Pt presents for NOB visit. This is a planned pregnancy and she is married. Pt c/o RLQ pelvic pain, denies VB.

## 2018-10-01 NOTE — Progress Notes (Signed)
Subjective:   Brandon MelnickJemariah M HAITH is a 23 y.o. G1P0 at 7344w3d by LMP being seen today for her first obstetrical visit.  Her obstetrical history is significant for Lupus. Patient does intend to breast feed. Pregnancy history fully reviewed.  Patient reports no complaints.  HISTORY: OB History  Gravida Para Term Preterm AB Living  1 0 0 0 0 0  SAB TAB Ectopic Multiple Live Births  0 0 0 0 0    # Outcome Date GA Lbr Len/2nd Weight Sex Delivery Anes PTL Lv  1 Current             Last pap smear was done 2018 per patient but is unsure of results   Past Medical History:  Diagnosis Date  . Anxiety   . Asthma   . Chest discomfort feb 2017   Pt states due to shortness of breath  . Lupus (systemic lupus erythematosus) (HCC)    dx 2010  . Pneumonia    2015  . Shortness of breath dyspnea feb 2017   changed inhaler with relief   Past Surgical History:  Procedure Laterality Date  . WISDOM TOOTH EXTRACTION  2014   Family History  Problem Relation Age of Onset  . Diabetes Paternal Grandmother    Social History   Tobacco Use  . Smoking status: Former Smoker    Types: Cigarettes  . Smokeless tobacco: Never Used  Substance Use Topics  . Alcohol use: Not Currently  . Drug use: Not Currently    Types: Marijuana   No Known Allergies Current Outpatient Medications on File Prior to Visit  Medication Sig Dispense Refill  . aspirin 81 MG chewable tablet Chew by mouth daily.    . hydroxychloroquine (PLAQUENIL) 200 MG tablet Take 1 tablet (200 mg total) by mouth daily. (Patient taking differently: Take 300 mg by mouth daily. ) 60 tablet 1  . predniSONE (DELTASONE) 5 MG tablet prednisone 5 mg tablet  TAKE 1 TABLET BY MOUTH EVERY DAY    . Prenatal Vit-Fe Fumarate-FA (MULTIVITAMIN-PRENATAL) 27-0.8 MG TABS tablet Take 1 tablet by mouth daily at 12 noon.    Marland Kitchen. acetaminophen (TYLENOL) 500 MG tablet Take 1,000 mg by mouth every 6 (six) hours as needed for moderate pain or headache.    .  albuterol (PROVENTIL HFA;VENTOLIN HFA) 108 (90 Base) MCG/ACT inhaler Inhale 1-2 puffs into the lungs every 6 (six) hours as needed for wheezing or shortness of breath. (Patient not taking: Reported on 10/01/2018) 1 Inhaler 0  . Belimumab (BENLYSTA IV) Inject 200 mg into the vein once a week.    . BENLYSTA 200 MG/ML SOAJ 200 mg once a week.  2  . polyvinyl alcohol (LIQUIFILM TEARS) 1.4 % ophthalmic solution Place 1 drop into both eyes 3 (three) times daily as needed for dry eyes.    . vitamin B-12 (CYANOCOBALAMIN) 1000 MCG tablet Take 1 tablet (1,000 mcg total) by mouth daily. (Patient not taking: Reported on 04/24/2017) 90 tablet 2   No current facility-administered medications on file prior to visit.     Review of Systems Pertinent items noted in HPI and remainder of comprehensive ROS otherwise negative.  Exam   Vitals:   10/01/18 1348  BP: 121/79  Pulse: 99  Weight: 125 lb (56.7 kg)   Fetal Heart Rate (bpm): 164   Pelvic Exam: Perineum: no hemorrhoids, normal perineum   Vulva: normal external genitalia, no lesions   Vagina:  normal mucosa, normal discharge   Cervix: no  lesions and normal, pap smear done.    Adnexa: normal adnexa and no mass, fullness, tenderness   Bony Pelvis: average  System: General: well-developed, well-nourished female in no acute distress   Breasts:  normal appearance, no masses or tenderness bilaterally   Skin: normal coloration and turgor, no rashes   Neurologic: oriented, normal, negative, normal mood   Extremities: normal strength, tone, and muscle mass, ROM of all joints is normal   HEENT PERRLA, extraocular movement intact and sclera clear   Mouth/Teeth mucous membranes moist, pharynx normal without lesions and dental hygiene good   Neck supple and no masses   Cardiovascular: regular rate and rhythm   Respiratory:  no respiratory distress, normal breath sounds   Abdomen: soft, non-tender; bowel sounds normal; no masses,  no organomegaly   Assessment:    Pregnancy: G1P0 Patient Active Problem List   Diagnosis Date Noted  . Supervision of normal first pregnancy, antepartum 10/01/2018  . Systemic lupus erythematosus (SLE) affecting pregnancy, antepartum (HCC) 06/04/2012     Plan:  1. Supervision of normal first pregnancy, antepartum - Anticipatory guidance on upcoming appointments - Obstetric Panel, Including HIV - Culture, OB Urine - Genetic Screening - Hemoglobinopathy evaluation - Cystic Fibrosis Mutation 97 - SMN1 COPY NUMBER ANALYSIS (SMA Carrier Screen) - [facility] AMB BABYSCRIPTS OPT IN - Flu Vaccine QUAD 36+ mos IM - Cytology - PAP( [place]) - US MFM OB DETAIL +14 WK; Future - Prenat-FePoly-Metf-FA-DHA-DSS (VITAFOL FE+) 90-1-200 & 50 MG CPPK; Take 1 tablet by mouth daily.  Dispense: 60 each; Refill: 5  2. Systemic lupus erythematosus (SLE) affecting pregnancy, antepartum (HCC) - Patient was seen by your rheumatologist last week - Is being managed every 3 months during her pregnancy   - US MFM OB DETAIL +14 WK; Future   Initial labs drawn. Continue prenatal vitamins- refill sent to pharmacy  Genetic Screening discussed, NIPS: ordered. Ultrasound discussed; fetal anatomic survey: ordered. Problem list reviewed and updated. The nature of [place] [facility] with multiple MDs and other Advanced Practice Providers was explained to patient; also emphasized that residents, students are part of our team. Routine obstetric precautions reviewed. Return in about 4 weeks (around 10/29/2018) for ROB.    Sharyon CableVeronica C Kaelynn Igo, CNM [facility], [facility]

## 2018-10-02 ENCOUNTER — Inpatient Hospital Stay (HOSPITAL_COMMUNITY)
Admission: AD | Admit: 2018-10-02 | Discharge: 2018-10-02 | Disposition: A | Discharge status: Home / Self Care | Payer: BLUE CROSS/BLUE SHIELD | Attending: Obstetrics and Gynecology | Admitting: Obstetrics and Gynecology

## 2018-10-02 ENCOUNTER — Other Ambulatory Visit: Payer: Self-pay

## 2018-10-02 ENCOUNTER — Encounter (HOSPITAL_COMMUNITY): Payer: Self-pay | Admitting: *Deleted

## 2018-10-02 DIAGNOSIS — R079 Chest pain, unspecified: Secondary | ICD-10-CM | POA: Diagnosis not present

## 2018-10-02 DIAGNOSIS — O26891 Other specified pregnancy related conditions, first trimester: Secondary | ICD-10-CM | POA: Insufficient documentation

## 2018-10-02 DIAGNOSIS — Z3A11 11 weeks gestation of pregnancy: Secondary | ICD-10-CM | POA: Diagnosis not present

## 2018-10-02 DIAGNOSIS — M940 Chondrocostal junction syndrome [Tietze]: Secondary | ICD-10-CM

## 2018-10-02 DIAGNOSIS — Z87891 Personal history of nicotine dependence: Secondary | ICD-10-CM | POA: Insufficient documentation

## 2018-10-02 NOTE — Discharge Instructions (Signed)
Costochondritis    Costochondritis is swelling and irritation (inflammation) of the tissue (cartilage) that connects your ribs to your breastbone (sternum). This causes pain in the front of your chest. The pain usually starts gradually and involves more than one rib.  What are the causes?  The exact cause of this condition is not always known. It results from stress on the cartilage where your ribs attach to your sternum. The cause of this stress could be:   Chest injury (trauma).   Exercise or activity, such as lifting.   Severe coughing.  What increases the risk?  You may be at higher risk for this condition if you:   Are female.   Are 30?24 years old.   Recently started a new exercise or work activity.   Have low levels of vitamin D.   Have a condition that makes you cough frequently.  What are the signs or symptoms?  The main symptom of this condition is chest pain. The pain:   Usually starts gradually and can be sharp or dull.   Gets worse with deep breathing, coughing, or exercise.   Gets better with rest.   May be worse when you press on the sternum-rib connection (tenderness).  How is this diagnosed?  This condition is diagnosed based on your symptoms, medical history, and a physical exam. Your health care provider will check for tenderness when pressing on your sternum. This is the most important finding. You may also have tests to rule out other causes of chest pain. These may include:   A chest X-ray to check for lung problems.   An electrocardiogram (ECG) to see if you have a heart problem that could be causing the pain.   An imaging scan to rule out a chest or rib fracture.  How is this treated?  This condition usually goes away on its own over time. Your health care provider may prescribe an NSAID to reduce pain and inflammation. Your health care provider may also suggest that you:   Rest and avoid activities that make pain worse.   Apply heat or cold to the area to reduce pain and  inflammation.   Do exercises to stretch your chest muscles.  If these treatments do not help, your health care provider may inject a numbing medicine at the sternum-rib connection to help relieve the pain.  Follow these instructions at home:   Avoid activities that make pain worse. This includes any activities that use chest, abdominal, and side muscles.   If directed, put ice on the painful area:  ? Put ice in a plastic bag.  ? Place a towel between your skin and the bag.  ? Leave the ice on for 20 minutes, 2-3 times a day.   If directed, apply heat to the affected area as often as told by your health care provider. Use the heat source that your health care provider recommends, such as a moist heat pack or a heating pad.  ? Place a towel between your skin and the heat source.  ? Leave the heat on for 20-30 minutes.  ? Remove the heat if your skin turns bright red. This is especially important if you are unable to feel pain, heat, or cold. You may have a greater risk of getting burned.   Take over-the-counter and prescription medicines only as told by your health care provider.   Return to your normal activities as told by your health care provider. Ask your health care provider what   activities are safe for you.   Keep all follow-up visits as told by your health care provider. This is important.  Contact a health care provider if:   You have chills or a fever.   Your pain does not go away or it gets worse.   You have a cough that does not go away (is persistent).  Get help right away if:   You have shortness of breath.  This information is not intended to replace advice given to you by your health care provider. Make sure you discuss any questions you have with your health care provider.  Document Released: 05/23/2005 Document Revised: 05/15/2017 Document Reviewed: 12/07/2015  Elsevier Interactive Patient Education  2019 [employer]

## 2018-10-02 NOTE — MAU Provider Note (Signed)
History     CSN: [id number]  Arrival date and time: 10/02/18 1009   First Provider Initiated Contact with Patient 10/02/18 1220      Chief Complaint  Patient presents with  . Chest Pain  . Cough   Kelly [name] is a 23 y.o. G1P0 at [redacted]w[redacted]d who presents for Chest Pain and Cough. Patient states chest pain has been present for over a week.  She states she completed a steroid taper last night and started to experience a different type of pain. Patient states the pain is aching in nature and is constant.  She says it is almost like a shortness of breath and she used her inhaler last night and this morning with no improvement.  She endorses a feeling of chest heaviness and reports a history of left side pneumonia in 2015 that resulted in hospitilization. She also endorses recent treatment for viral illness and productive cough, but states her cough is no longer productive and pain is most significant with coughing.  She denies vaginal bleeding or pregnancy related concerns.  She does endorse a history of lupus and asthma.      OB History    Gravida  1   Para      Term      Preterm      AB      Living        SAB      TAB      Ectopic      Multiple      Live Births              Past Medical History:  Diagnosis Date  . Anxiety   . Asthma   . Chest discomfort feb 2017   Pt states due to shortness of breath  . Lupus (systemic lupus erythematosus) (HCC)    dx 2010  . Pneumonia    2015  . Shortness of breath dyspnea feb 2017   changed inhaler with relief    Past Surgical History:  Procedure Laterality Date  . WISDOM TOOTH EXTRACTION  2014    Family History  Problem Relation Age of Onset  . Diabetes Paternal Grandmother     Social History   Tobacco Use  . Smoking status: Former Smoker    Types: Cigarettes  . Smokeless tobacco: Never Used  Substance Use Topics  . Alcohol use: Not Currently  . Drug use: Not Currently    Types: Marijuana     Allergies: No Known Allergies  Medications Prior to Admission  Medication Sig Dispense Refill Last Dose  . acetaminophen (TYLENOL) 500 MG tablet Take 1,000 mg by mouth every 6 (six) hours as needed for moderate pain or headache.   Not Taking  . albuterol (PROVENTIL HFA;VENTOLIN HFA) 108 (90 Base) MCG/ACT inhaler Inhale 1-2 puffs into the lungs every 6 (six) hours as needed for wheezing or shortness of breath. (Patient not taking: Reported on 10/01/2018) 1 Inhaler 0 Not Taking  . aspirin 81 MG chewable tablet Chew by mouth daily.   Taking  . Belimumab (BENLYSTA IV) Inject 200 mg into the vein once a week.   Not Taking  . BENLYSTA 200 MG/ML SOAJ 200 mg once a week.  2 Not Taking  . hydroxychloroquine (PLAQUENIL) 200 MG tablet Take 1 tablet (200 mg total) by mouth daily. (Patient taking differently: Take 300 mg by mouth daily. ) 60 tablet 1 Taking  . polyvinyl alcohol (LIQUIFILM TEARS) 1.4 % ophthalmic solution Place 1 drop into  both eyes 3 (three) times daily as needed for dry eyes.   Not Taking  . predniSONE (DELTASONE) 5 MG tablet prednisone 5 mg tablet  TAKE 1 TABLET BY MOUTH EVERY DAY   Taking  . Prenat-FePoly-Metf-FA-DHA-DSS (VITAFOL FE+) 90-1-200 & 50 MG CPPK Take 1 tablet by mouth daily. 60 each 5   . Prenatal Vit-Fe Fumarate-FA (MULTIVITAMIN-PRENATAL) 27-0.8 MG TABS tablet Take 1 tablet by mouth daily at 12 noon.   Taking  . vitamin B-12 (CYANOCOBALAMIN) 1000 MCG tablet Take 1 tablet (1,000 mcg total) by mouth daily. (Patient not taking: Reported on 04/24/2017) 90 tablet 2 Not Taking    Review of Systems  Constitutional: Negative for chills and fever.  HENT: Negative for congestion.   Respiratory: Positive for cough and chest tightness.   Genitourinary: Negative for vaginal bleeding, vaginal discharge and vaginal pain.   Physical Exam   Blood pressure 112/74, pulse 88, temperature 98.8 F (37.1 C), temperature source Oral, resp. rate 20, weight 56.6 kg, last menstrual period  07/13/2018, SpO2 100 %.  Physical Exam  Constitutional: She is oriented to person, place, and time. She appears well-developed and well-nourished. No distress.  HENT:  Head: Normocephalic and atraumatic.  Eyes: Conjunctivae are normal.  Neck: Normal range of motion.  Cardiovascular: Normal rate, regular rhythm and normal heart sounds.  Respiratory: Effort normal and breath sounds normal. No respiratory distress. She exhibits no tenderness and no crepitus.  GI: Soft. Bowel sounds are normal.  Musculoskeletal: Normal range of motion.  Neurological: She is alert and oriented to person, place, and time.  Skin: Skin is warm and dry.  Psychiatric: She has a normal mood and affect. Her behavior is normal.    MAU Course  Procedures   MDM Physical Exam Education EKG  Assessment and Plan  11.4wks Chest Pain Recent Viral Illness  -Exam findings discussed. -Reassured that no adventitious sounds heard over lungs. -Educated on Costocondritis and its effects including chest pain. Informed that most likely due to recent coughing with production of sputum.  -Discussed weakening of immune system and need for rest in pregnancy especially during illness. -Informed that presentation not c/w pneumonia, but EKG is warranted for chest pain   Follow Up (1:44 PM) ECG Normal  -Dr. Elease Hashimoto returns call and reports normal ECG. -Patient informed of normal findings. -Discussed OOW until Monday to promote rest. -Work excuse given. -Encouraged to call or return to MAU if symptoms worsen or with the onset of new symptoms. -Discharged to home in stable condition  Cherre Robins MSN, CNM 10/02/2018, 12:21 PM

## 2018-10-02 NOTE — MAU Note (Signed)
Came last wk for resp issues, was put on prednisone and gave her a resp treatment.  During the night was having chest pian when she coughs( non-productive)  and inhales. Denies fever.

## 2018-10-03 LAB — URINE CULTURE, OB REFLEX

## 2018-10-03 LAB — CULTURE, OB URINE

## 2018-10-06 LAB — CYTOLOGY - PAP
Bacterial vaginitis: POSITIVE — AB
Candida vaginitis: NEGATIVE
Chlamydia: NEGATIVE
Neisseria Gonorrhea: NEGATIVE
Trichomonas: NEGATIVE

## 2018-10-10 ENCOUNTER — Telehealth: Payer: Self-pay | Admitting: *Deleted

## 2018-10-10 NOTE — Telephone Encounter (Signed)
Pt called to office for Genetic Screening results.  Call placed to pt.  Pt made aware that [facility] results are still pending as lab is still in process. Pt advise to call back mid next week to verify if results are back.  Pt states understanding.

## 2018-10-11 LAB — HEMOGLOBINOPATHY EVALUATION
HGB C: 0 %
HGB S: 0 %
HGB VARIANT: 0 %
Hemoglobin A2 Quantitation: 2.8 % (ref 1.8–3.2)
Hemoglobin F Quantitation: 0 % (ref 0.0–2.0)
Hgb A: 97.2 % (ref 96.4–98.8)

## 2018-10-11 LAB — SMN1 COPY NUMBER ANALYSIS (SMA CARRIER SCREENING)

## 2018-10-11 LAB — OBSTETRIC PANEL, INCLUDING HIV
Antibody Screen: NEGATIVE
Basophils Absolute: 0 10*3/uL (ref 0.0–0.2)
Basos: 0 %
EOS (ABSOLUTE): 0 10*3/uL (ref 0.0–0.4)
Eos: 0 %
HIV Screen 4th Generation wRfx: NONREACTIVE
Hematocrit: 26.6 % — ABNORMAL LOW (ref 34.0–46.6)
Hemoglobin: 9.1 g/dL — ABNORMAL LOW (ref 11.1–15.9)
Hepatitis B Surface Ag: NEGATIVE
Immature Grans (Abs): 0.1 10*3/uL (ref 0.0–0.1)
Immature Granulocytes: 2 %
Lymphocytes Absolute: 0.5 10*3/uL — ABNORMAL LOW (ref 0.7–3.1)
Lymphs: 7 %
MCH: 30.4 pg (ref 26.6–33.0)
MCHC: 34.2 g/dL (ref 31.5–35.7)
MCV: 89 fL (ref 79–97)
Monocytes Absolute: 0.3 10*3/uL (ref 0.1–0.9)
Monocytes: 4 %
Neutrophils Absolute: 6.6 10*3/uL (ref 1.4–7.0)
Neutrophils: 87 %
Platelets: 335 10*3/uL (ref 150–450)
RBC: 2.99 x10E6/uL — ABNORMAL LOW (ref 3.77–5.28)
RDW: 13.2 % (ref 11.7–15.4)
RPR Ser Ql: NONREACTIVE
Rh Factor: POSITIVE
Rubella Antibodies, IGG: 20 index (ref >0.99)
WBC: 7.5 10*3/uL (ref 3.4–10.8)

## 2018-10-11 LAB — CYSTIC FIBROSIS MUTATION 97: Interpretation: NOT DETECTED

## 2018-10-14 ENCOUNTER — Other Ambulatory Visit: Payer: Self-pay

## 2018-10-14 DIAGNOSIS — D509 Iron deficiency anemia, unspecified: Secondary | ICD-10-CM | POA: Insufficient documentation

## 2018-10-14 DIAGNOSIS — O99019 Anemia complicating pregnancy, unspecified trimester: Secondary | ICD-10-CM

## 2018-10-14 MED ORDER — FERROUS GLUCONATE 324 (38 FE) MG PO TABS: 324.0000 mg | ORAL_TABLET | Freq: Every day | ORAL | 3 refills | Status: DC

## 2018-10-14 MED ORDER — METRONIDAZOLE 0.75 % VA GEL: 1.0000 | Freq: Every day | VAGINAL | 0 refills | Status: DC

## 2018-10-14 NOTE — Addendum Note (Signed)
Addended by: Sharyon Cable on: 10/14/2018 09:45 AM   Modules accepted: Orders

## 2018-10-30 ENCOUNTER — Other Ambulatory Visit: Payer: Self-pay

## 2018-10-30 ENCOUNTER — Encounter: Payer: Self-pay | Admitting: Certified Nurse Midwife

## 2018-10-30 ENCOUNTER — Ambulatory Visit (INDEPENDENT_AMBULATORY_CARE_PROVIDER_SITE_OTHER): Payer: BLUE CROSS/BLUE SHIELD | Admitting: Certified Nurse Midwife

## 2018-10-30 VITALS — BP 109/70 | HR 98 | Wt 129.0 lb

## 2018-10-30 DIAGNOSIS — Z3A15 15 weeks gestation of pregnancy: Secondary | ICD-10-CM

## 2018-10-30 DIAGNOSIS — D509 Iron deficiency anemia, unspecified: Secondary | ICD-10-CM

## 2018-10-30 DIAGNOSIS — Z3400 Encounter for supervision of normal first pregnancy, unspecified trimester: Secondary | ICD-10-CM

## 2018-10-30 DIAGNOSIS — O99012 Anemia complicating pregnancy, second trimester: Secondary | ICD-10-CM

## 2018-10-30 DIAGNOSIS — O99019 Anemia complicating pregnancy, unspecified trimester: Secondary | ICD-10-CM

## 2018-10-30 NOTE — Progress Notes (Signed)
   PRENATAL VISIT NOTE  Subjective:  Kelly [name] is a 23 y.o. G1P0 at [redacted]w[redacted]d being seen today for ongoing prenatal care.  She is currently monitored for the following issues for this low-risk pregnancy and has Supervision of normal first pregnancy, antepartum; Systemic lupus erythematosus (SLE) affecting pregnancy, antepartum (HCC); and Iron deficiency anemia during pregnancy, antepartum on their problem list.  Patient reports no complaints.  Contractions: Not present. Vag. Bleeding: None.  Movement: Absent. Denies leaking of fluid.   The following portions of the patient's history were reviewed and updated as appropriate: allergies, current medications, past family history, past medical history, past social history, past surgical history and problem list. Problem list updated.  Objective:  Vitals:   10/30/18 1623  BP: 109/70  Pulse: 98  Weight: 129 lb (58.5 kg)    Fetal Status: Fetal Heart Rate (bpm): 155   Movement: Absent     General:  Alert, oriented and cooperative. Patient is in no acute distress.  Skin: Skin is warm and dry. No rash noted.   Cardiovascular: Normal heart rate noted  Respiratory: Normal respiratory effort, no problems with respiration noted  Abdomen: Soft, gravid, appropriate for gestational age.  Pain/Pressure: Present     Pelvic: Cervical exam deferred        Extremities: Normal range of motion.  Edema: Trace  Mental Status: Normal mood and affect. Normal behavior. Normal judgment and thought content.   Assessment and Plan:  Pregnancy: G1P0 at [redacted]w[redacted]d  1. Supervision of normal first pregnancy, antepartum - Patient doing well, no complaints - Anticipatory guidance on upcoming appointments  - Anatomy US scheduled for 3/30  - Genetic Screening - AFP, Serum, Open Spina Bifida  2. Iron deficiency anemia during pregnancy, antepartum - Continue iron supplementation   Preterm labor symptoms and general obstetric precautions including but not limited to  vaginal bleeding, contractions, leaking of fluid and fetal movement were reviewed in detail with the patient. Please refer to After Visit Summary for other counseling recommendations.  Return in about 4 weeks (around 11/27/2018) for ROB.  Future Appointments  Date Time Provider Department Center  11/24/2018  1:00 PM [facility] [place] 3 [facility]  11/27/2018  3:55 PM Sharyon Cable, CNM [facility] None    Sharyon Cable, CNM

## 2018-10-30 NOTE — Patient Instructions (Signed)

## 2018-10-30 NOTE — Progress Notes (Signed)
ROB

## 2018-11-04 LAB — AFP, SERUM, OPEN SPINA BIFIDA
AFP MoM: 1.01
AFP Value: 38.4 ng/mL
Gest. Age on Collection Date: 15.6 weeks
Maternal Age At EDD: 24 yr
OSBR Risk 1 IN: 10000
Test Results:: NEGATIVE
Weight: 129 [lb_av]

## 2018-11-17 ENCOUNTER — Encounter (HOSPITAL_COMMUNITY): Payer: Self-pay

## 2018-11-19 ENCOUNTER — Telehealth: Payer: Self-pay | Admitting: *Deleted

## 2018-11-19 NOTE — Telephone Encounter (Signed)
Pt called for safe OTC meds to take. Pt made aware of safe meds.  Advised to call if symptoms worsen or no change. Pt states understanding.

## 2018-11-24 ENCOUNTER — Encounter (HOSPITAL_COMMUNITY): Payer: Self-pay | Admitting: *Deleted

## 2018-11-24 ENCOUNTER — Other Ambulatory Visit: Payer: Self-pay

## 2018-11-24 ENCOUNTER — Ambulatory Visit (HOSPITAL_COMMUNITY)
Admission: RE | Admit: 2018-11-24 | Discharge: 2018-11-24 | Disposition: A | Discharge status: Home / Self Care | Payer: Medicaid Other | Source: Ambulatory Visit | Attending: Obstetrics and Gynecology | Admitting: Obstetrics and Gynecology

## 2018-11-24 ENCOUNTER — Other Ambulatory Visit (HOSPITAL_COMMUNITY): Payer: Self-pay | Admitting: *Deleted

## 2018-11-24 ENCOUNTER — Ambulatory Visit (HOSPITAL_COMMUNITY): Payer: Medicaid Other | Admitting: *Deleted

## 2018-11-24 DIAGNOSIS — O99019 Anemia complicating pregnancy, unspecified trimester: Secondary | ICD-10-CM | POA: Diagnosis present

## 2018-11-24 DIAGNOSIS — M329 Systemic lupus erythematosus, unspecified: Secondary | ICD-10-CM | POA: Diagnosis present

## 2018-11-24 DIAGNOSIS — O26892 Other specified pregnancy related conditions, second trimester: Secondary | ICD-10-CM

## 2018-11-24 DIAGNOSIS — O9989 Other specified diseases and conditions complicating pregnancy, childbirth and the puerperium: Secondary | ICD-10-CM | POA: Diagnosis present

## 2018-11-24 DIAGNOSIS — Z3A19 19 weeks gestation of pregnancy: Secondary | ICD-10-CM

## 2018-11-24 DIAGNOSIS — Z3400 Encounter for supervision of normal first pregnancy, unspecified trimester: Secondary | ICD-10-CM | POA: Insufficient documentation

## 2018-11-24 DIAGNOSIS — Z363 Encounter for antenatal screening for malformations: Secondary | ICD-10-CM

## 2018-11-24 DIAGNOSIS — D509 Iron deficiency anemia, unspecified: Secondary | ICD-10-CM

## 2018-11-27 ENCOUNTER — Other Ambulatory Visit: Payer: Self-pay

## 2018-11-27 ENCOUNTER — Ambulatory Visit (INDEPENDENT_AMBULATORY_CARE_PROVIDER_SITE_OTHER): Payer: Medicaid Other | Admitting: Certified Nurse Midwife

## 2018-11-27 DIAGNOSIS — O99891 Other specified diseases and conditions complicating pregnancy: Secondary | ICD-10-CM

## 2018-11-27 DIAGNOSIS — Z3490 Encounter for supervision of normal pregnancy, unspecified, unspecified trimester: Secondary | ICD-10-CM

## 2018-11-27 DIAGNOSIS — Z3A19 19 weeks gestation of pregnancy: Secondary | ICD-10-CM

## 2018-11-27 DIAGNOSIS — D509 Iron deficiency anemia, unspecified: Secondary | ICD-10-CM

## 2018-11-27 DIAGNOSIS — Z3400 Encounter for supervision of normal first pregnancy, unspecified trimester: Secondary | ICD-10-CM

## 2018-11-27 DIAGNOSIS — M329 Systemic lupus erythematosus, unspecified: Secondary | ICD-10-CM

## 2018-11-27 DIAGNOSIS — O9989 Other specified diseases and conditions complicating pregnancy, childbirth and the puerperium: Secondary | ICD-10-CM

## 2018-11-27 DIAGNOSIS — O99012 Anemia complicating pregnancy, second trimester: Secondary | ICD-10-CM

## 2018-11-27 DIAGNOSIS — O99019 Anemia complicating pregnancy, unspecified trimester: Secondary | ICD-10-CM

## 2018-11-27 HISTORY — DX: Encounter for supervision of normal pregnancy, unspecified, unspecified trimester: Z34.90

## 2018-11-27 NOTE — Progress Notes (Signed)
   TELEHEALTH VIRTUAL OBSTETRICS VISIT ENCOUNTER NOTE  I connected with Kelly [name] on 11/27/18 at  3:31 PM EDT by telephone at home and verified that I am speaking with the correct person using two identifiers.   I discussed the limitations, risks, security and privacy concerns of performing an evaluation and management service by telephone and the availability of in person appointments. I also discussed with the patient that there may be a patient responsible charge related to this service. The patient expressed understanding and agreed to proceed.  Subjective:  Kelly [name] is a 23 y.o. G1P0 at [redacted]w[redacted]d being followed for ongoing prenatal care.  She is currently monitored for the following issues for this low-risk pregnancy and has Supervision of normal first pregnancy, antepartum; Systemic lupus erythematosus (SLE) affecting pregnancy, antepartum (HCC); and Iron deficiency anemia during pregnancy, antepartum on their problem list.  Patient reports no complaints. Reports fetal movement. Denies any contractions, bleeding or leaking of fluid.   The following portions of the patient's history were reviewed and updated as appropriate: allergies, current medications, past family history, past medical history, past social history, past surgical history and problem list.   Objective:   General:  Alert, oriented and cooperative.   Mental Status: Normal mood and affect perceived. Normal judgment and thought content.  Rest of physical exam deferred due to type of encounter  Assessment and Plan:  Pregnancy: G1P0 at [redacted]w[redacted]d 1. Supervision of normal first pregnancy, antepartum - Patient doing well, no complaints - Anticipatory guidance on upcoming appointments  - COVID19 precautions discussed - Reviewed anatomy US and discussed follow up appointment for fetal growth - Enrolled in Babyscripts, will come to office to pick up BP cuff    2. Systemic lupus erythematosus (SLE) affecting pregnancy,  antepartum (HCC) - Continue follow up with rheumatologist for management  - Was seen on 3/30, no changes in medication at this time    3. Iron deficiency anemia during pregnancy, antepartum - Continue supplementation   Preterm labor symptoms and general obstetric precautions including but not limited to vaginal bleeding, contractions, leaking of fluid and fetal movement were reviewed in detail with the patient.  I discussed the assessment and treatment plan with the patient. The patient was provided an opportunity to ask questions and all were answered. The patient agreed with the plan and demonstrated an understanding of the instructions. The patient was advised to call back or seek an in-person office evaluation/go to MAU at [facility] for any urgent or concerning symptoms. Please refer to After Visit Summary for other counseling recommendations.   I provided 10 minutes of non-face-to-face time during this encounter.  Return in about 4 weeks (around 12/25/2018) for TELEVISIT-ROB.  Future Appointments  Date Time Provider Department Center  01/06/2019 11:00 AM [facility] NURSE [facility]  01/06/2019 11:00 AM [facility] [place] 3 [facility]    Sharyon Cable, CNM Center for [employer], [facility]

## 2018-12-25 ENCOUNTER — Ambulatory Visit (INDEPENDENT_AMBULATORY_CARE_PROVIDER_SITE_OTHER): Payer: Medicaid Other

## 2018-12-25 ENCOUNTER — Other Ambulatory Visit: Payer: Self-pay

## 2018-12-25 DIAGNOSIS — Z3A23 23 weeks gestation of pregnancy: Secondary | ICD-10-CM

## 2018-12-25 DIAGNOSIS — Z3402 Encounter for supervision of normal first pregnancy, second trimester: Secondary | ICD-10-CM

## 2018-12-25 DIAGNOSIS — Z3400 Encounter for supervision of normal first pregnancy, unspecified trimester: Secondary | ICD-10-CM

## 2018-12-25 NOTE — Progress Notes (Signed)
Pt is on the phone preparing for Webex visit with provider. [redacted]w[redacted]d Pt does not have a BP cuff at home. Babyscripts ordered and pt instructed to activate so she can receive kit in the mail. Pt denies any HA's or blurry vision.

## 2018-12-25 NOTE — Progress Notes (Signed)
   TELEHEALTH VIRTUAL OBSTETRICS VISIT ENCOUNTER NOTE  I connected with Kelly [name] on 12/25/18 at  1:30 PM EDT by WebEx at home and verified that I am speaking with the correct person using two identifiers.   I discussed the limitations, risks, security and privacy concerns of performing an evaluation and management service by telephone and the availability of in person appointments. I also discussed with the patient that there may be a patient responsible charge related to this service. The patient expressed understanding and agreed to proceed.  Subjective:  Kelly [name] is a 23 y.o. G1P0 at [redacted]w[redacted]d being followed for ongoing prenatal care.  She is currently monitored for the following issues for this low-risk pregnancy and has Supervision of normal first pregnancy, antepartum; Systemic lupus erythematosus (SLE) affecting pregnancy, antepartum (HCC); Iron deficiency anemia during pregnancy, antepartum; and Late Entry to Babyscripts- April 2020- Social Distancing  on their problem list.  Patient reports worsening round ligament pain and acid reflux. Reports fetal movement. Denies any contractions, bleeding or leaking of fluid.   The following portions of the patient's history were reviewed and updated as appropriate: allergies, current medications, past family history, past medical history, past social history, past surgical history and problem list.   Objective:   General:  Alert, oriented and cooperative.   Mental Status: Normal mood and affect perceived. Normal judgment and thought content.  Rest of physical exam deferred due to type of encounter  Assessment and Plan:  Pregnancy: G1P0 at [redacted]w[redacted]d 1. Supervision of normal first pregnancy, antepartum - Patient has not received cuff. Will download app and call office if no cuff next week - Babyscripts Schedule Optimization - Rx for pregnancy support belt sent to pharmacy and reassurance of normalcy given to patient - Patient reports  milk is helping with acid reflux and declines medication at this time - Anticipatory guidance of next appointment with GTT given to patient.  Preterm labor symptoms and general obstetric precautions including but not limited to vaginal bleeding, contractions, leaking of fluid and fetal movement were reviewed in detail with the patient.  I discussed the assessment and treatment plan with the patient. The patient was provided an opportunity to ask questions and all were answered. The patient agreed with the plan and demonstrated an understanding of the instructions. The patient was advised to call back or seek an in-person office evaluation/go to MAU at [facility] for any urgent or concerning symptoms. Please refer to After Visit Summary for other counseling recommendations.   I provided 17 minutes of non-face-to-face time during this encounter.  Return in about 5 weeks (around 01/29/2019) for Return OB visit, 2hr GTT and labs.  Future Appointments  Date Time Provider Department Center  12/25/2018  1:30 PM Rolm Bookbinder, CNM [facility] None  01/06/2019 11:00 AM [facility] NURSE [facility]  01/06/2019 11:00 AM [facility] [place] 3 [facility]    Rolm Bookbinder, CNM [facility] [employer], [facility]

## 2019-01-06 ENCOUNTER — Encounter (HOSPITAL_COMMUNITY): Payer: Self-pay

## 2019-01-06 ENCOUNTER — Other Ambulatory Visit: Payer: Self-pay

## 2019-01-06 ENCOUNTER — Ambulatory Visit (HOSPITAL_COMMUNITY)
Admission: RE | Admit: 2019-01-06 | Discharge: 2019-01-06 | Disposition: A | Discharge status: Home / Self Care | Payer: Medicaid Other | Source: Ambulatory Visit | Attending: Obstetrics and Gynecology | Admitting: Obstetrics and Gynecology

## 2019-01-06 ENCOUNTER — Other Ambulatory Visit (HOSPITAL_COMMUNITY): Payer: Self-pay | Admitting: *Deleted

## 2019-01-06 ENCOUNTER — Ambulatory Visit (HOSPITAL_COMMUNITY): Payer: Medicaid Other | Admitting: *Deleted

## 2019-01-06 VITALS — BP 113/66 | HR 98 | Temp 98.4°F

## 2019-01-06 DIAGNOSIS — M329 Systemic lupus erythematosus, unspecified: Secondary | ICD-10-CM

## 2019-01-06 DIAGNOSIS — Z3A25 25 weeks gestation of pregnancy: Secondary | ICD-10-CM

## 2019-01-06 DIAGNOSIS — O0990 Supervision of high risk pregnancy, unspecified, unspecified trimester: Secondary | ICD-10-CM | POA: Diagnosis present

## 2019-01-06 DIAGNOSIS — Z362 Encounter for other antenatal screening follow-up: Secondary | ICD-10-CM

## 2019-01-06 DIAGNOSIS — O26892 Other specified pregnancy related conditions, second trimester: Secondary | ICD-10-CM

## 2019-01-14 ENCOUNTER — Inpatient Hospital Stay (HOSPITAL_COMMUNITY): Payer: Medicaid Other

## 2019-01-14 ENCOUNTER — Inpatient Hospital Stay (HOSPITAL_COMMUNITY)
Admission: AD | Admit: 2019-01-14 | Discharge: 2019-01-14 | Disposition: A | Discharge status: Home / Self Care | Payer: Medicaid Other | Attending: Family Medicine | Admitting: Family Medicine

## 2019-01-14 ENCOUNTER — Other Ambulatory Visit: Payer: Self-pay

## 2019-01-14 ENCOUNTER — Encounter (HOSPITAL_COMMUNITY): Payer: Self-pay | Admitting: *Deleted

## 2019-01-14 DIAGNOSIS — R000 Tachycardia, unspecified: Secondary | ICD-10-CM | POA: Diagnosis not present

## 2019-01-14 DIAGNOSIS — O99019 Anemia complicating pregnancy, unspecified trimester: Secondary | ICD-10-CM

## 2019-01-14 DIAGNOSIS — R509 Fever, unspecified: Secondary | ICD-10-CM | POA: Diagnosis not present

## 2019-01-14 DIAGNOSIS — Z87891 Personal history of nicotine dependence: Secondary | ICD-10-CM | POA: Diagnosis not present

## 2019-01-14 DIAGNOSIS — Z3A26 26 weeks gestation of pregnancy: Secondary | ICD-10-CM | POA: Diagnosis not present

## 2019-01-14 DIAGNOSIS — O9989 Other specified diseases and conditions complicating pregnancy, childbirth and the puerperium: Secondary | ICD-10-CM | POA: Insufficient documentation

## 2019-01-14 DIAGNOSIS — R05 Cough: Secondary | ICD-10-CM

## 2019-01-14 DIAGNOSIS — O368320 Maternal care for abnormalities of the fetal heart rate or rhythm, second trimester, not applicable or unspecified: Secondary | ICD-10-CM | POA: Diagnosis not present

## 2019-01-14 DIAGNOSIS — M940 Chondrocostal junction syndrome [Tietze]: Secondary | ICD-10-CM

## 2019-01-14 DIAGNOSIS — Z8709 Personal history of other diseases of the respiratory system: Secondary | ICD-10-CM

## 2019-01-14 DIAGNOSIS — O99512 Diseases of the respiratory system complicating pregnancy, second trimester: Secondary | ICD-10-CM | POA: Diagnosis not present

## 2019-01-14 DIAGNOSIS — D509 Iron deficiency anemia, unspecified: Secondary | ICD-10-CM

## 2019-01-14 DIAGNOSIS — R058 Other specified cough: Secondary | ICD-10-CM

## 2019-01-14 DIAGNOSIS — Z833 Family history of diabetes mellitus: Secondary | ICD-10-CM | POA: Insufficient documentation

## 2019-01-14 DIAGNOSIS — J45909 Unspecified asthma, uncomplicated: Secondary | ICD-10-CM | POA: Insufficient documentation

## 2019-01-14 DIAGNOSIS — O99012 Anemia complicating pregnancy, second trimester: Secondary | ICD-10-CM | POA: Diagnosis not present

## 2019-01-14 DIAGNOSIS — Z1159 Encounter for screening for other viral diseases: Secondary | ICD-10-CM | POA: Insufficient documentation

## 2019-01-14 LAB — CBC WITH DIFFERENTIAL/PLATELET
Abs Immature Granulocytes: 0.1 10*3/uL — ABNORMAL HIGH (ref 0.00–0.07)
Basophils Absolute: 0 10*3/uL (ref 0.0–0.1)
Basophils Relative: 0 %
Eosinophils Absolute: 0.1 10*3/uL (ref 0.0–0.5)
Eosinophils Relative: 1 %
HCT: 26.3 % — ABNORMAL LOW (ref 36.0–46.0)
Hemoglobin: 8.6 g/dL — ABNORMAL LOW (ref 12.0–15.0)
Lymphocytes Relative: 7 %
Lymphs Abs: 0.5 10*3/uL — ABNORMAL LOW (ref 0.7–4.0)
MCH: 29.6 pg (ref 26.0–34.0)
MCHC: 32.7 g/dL (ref 30.0–36.0)
MCV: 90.4 fL (ref 80.0–100.0)
Monocytes Absolute: 0.1 10*3/uL (ref 0.1–1.0)
Monocytes Relative: 1 %
Myelocytes: 2 %
Neutro Abs: 6.4 10*3/uL (ref 1.7–7.7)
Neutrophils Relative %: 89 %
Platelets: 341 10*3/uL (ref 150–400)
RBC: 2.91 MIL/uL — ABNORMAL LOW (ref 3.87–5.11)
RDW: 14.8 % (ref 11.5–15.5)
WBC: 7.2 10*3/uL (ref 4.0–10.5)
nRBC: 0 % (ref 0.0–0.2)
nRBC: 0 /100 WBC

## 2019-01-14 LAB — URINALYSIS, ROUTINE W REFLEX MICROSCOPIC
Bilirubin Urine: NEGATIVE
Glucose, UA: NEGATIVE mg/dL
Hgb urine dipstick: NEGATIVE
Ketones, ur: NEGATIVE mg/dL
Leukocytes,Ua: NEGATIVE
Nitrite: NEGATIVE
Protein, ur: NEGATIVE mg/dL
Specific Gravity, Urine: 1.018 (ref 1.005–1.030)
pH: 7 (ref 5.0–8.0)

## 2019-01-14 LAB — COMPREHENSIVE METABOLIC PANEL
ALT: 15 U/L (ref 0–44)
AST: 27 U/L (ref 15–41)
Albumin: 2.2 g/dL — ABNORMAL LOW (ref 3.5–5.0)
Alkaline Phosphatase: 55 U/L (ref 38–126)
Anion gap: 8 (ref 5–15)
BUN: 5 mg/dL — ABNORMAL LOW (ref 6–20)
CO2: 23 mmol/L (ref 22–32)
Calcium: 8 mg/dL — ABNORMAL LOW (ref 8.9–10.3)
Chloride: 104 mmol/L (ref 98–111)
Creatinine, Ser: 0.48 mg/dL (ref 0.44–1.00)
GFR calc Af Amer: >60 mL/min (ref >60)
GFR calc non Af Amer: >60 mL/min (ref >60)
Glucose, Bld: 83 mg/dL (ref 70–99)
Potassium: 3.6 mmol/L (ref 3.5–5.1)
Sodium: 135 mmol/L (ref 135–145)
Total Bilirubin: 0.3 mg/dL (ref 0.3–1.2)
Total Protein: 6.5 g/dL (ref 6.5–8.1)

## 2019-01-14 LAB — SARS CORONAVIRUS 2 BY RT PCR (HOSPITAL ORDER, PERFORMED IN ~~LOC~~ HOSPITAL LAB): SARS Coronavirus 2: NEGATIVE

## 2019-01-14 LAB — LACTIC ACID, PLASMA: Lactic Acid, Venous: 1.1 mmol/L (ref 0.5–1.9)

## 2019-01-14 MED ORDER — LACTATED RINGERS IV SOLN
INTRAVENOUS | Status: DC
  Administered 2019-01-14: 08:00:00 via INTRAVENOUS

## 2019-01-14 MED ORDER — BENZONATATE 100 MG PO CAPS: 200.0000 mg | ORAL_CAPSULE | Freq: Four times a day (QID) | ORAL | 0 refills | Status: DC | PRN

## 2019-01-14 MED ORDER — NIFEDIPINE 10 MG PO CAPS
10.0000 mg | ORAL_CAPSULE | ORAL | Status: DC | PRN
  Administered 2019-01-14 (×2): 10 mg via ORAL
  Filled 2019-01-14 (×2): qty 1

## 2019-01-14 MED ORDER — IBUPROFEN 600 MG PO TABS: 600.0000 mg | ORAL_TABLET | Freq: Four times a day (QID) | ORAL | 0 refills | Status: DC | PRN

## 2019-01-14 MED ORDER — GUAIFENESIN-CODEINE 100-10 MG/5ML PO SOLN
5.0000 mL | Freq: Once | ORAL | Status: AC
  Administered 2019-01-14: 10:00:00 5 mL via ORAL
  Filled 2019-01-14: qty 5

## 2019-01-14 MED ORDER — BUDESONIDE 90 MCG/ACT IN AEPB: 1.0000 | INHALATION_SPRAY | Freq: Two times a day (BID) | RESPIRATORY_TRACT | 1 refills | Status: DC

## 2019-01-14 MED ORDER — IBUPROFEN 600 MG PO TABS
600.0000 mg | ORAL_TABLET | Freq: Once | ORAL | Status: AC
  Administered 2019-01-14: 10:00:00 600 mg via ORAL
  Filled 2019-01-14: qty 1

## 2019-01-14 MED ORDER — PROMETHAZINE HCL 6.25 MG/5ML PO SYRP: 6.2500 mg | ORAL_SOLUTION | Freq: Four times a day (QID) | ORAL | 1 refills | Status: DC | PRN

## 2019-01-14 NOTE — MAU Note (Signed)
Pt presnts to MAU stating she was in here back in FEB pt states she was experiencing pneumonia like symptoms in her left lung. Pt states she has a hx of it back in 2015 and had a bronchoscopy. Pt states in feb she couldn't take deep breaths and she was coughing and coughing up sputum. Pt states the doctor said she couldn't have a xray due to pregnancy. Pt states she had a EKG and it was normal.pt was given inhaler in feb. Pt states she has still not stopped coughing since then and her lung or something hurts. Pt reports no chills, loss in taste, no sore throat, sob, diarrhea. Pt reports some braxton hicks that are every . No bleeding or lof. No FM in the last hour good FM before that. Pt reports some lower back pain as well.

## 2019-01-14 NOTE — Discharge Instructions (Signed)

## 2019-01-14 NOTE — MAU Provider Note (Addendum)
Chief Complaint:  No chief complaint on file.   First Provider Initiated Contact with Patient 01/14/19 0645      HPI: Kelly [name] is a 23 y.o. G1P0 at 56w3dwho presents to maternity admissions reporting Left lower chest pain since February (worse this week) which she states "is just like when I had pneumonia".  Coughing up copious sputum since February.  Was seen here but did not have a chest xray, only an EKG. Was told it was costochondritis.  Also has developed contractions    Did have intercourse yesterday.  She reports good fetal movement, denies LOF, vaginal bleeding, vaginal itching/burning, urinary symptoms, h/a, dizziness, n/v, diarrhea, constipation or fever/chills.  She denies headache, visual changes or RUQ abdominal pain.  RN Note: Pt presnts to MAU stating she was in here back in FEB pt states she was experiencing pneumonia like symptoms in her left lung. Pt states she has a hx of it back in 2015 and had a bronchoscopy. Pt states in feb she couldn't take deep breaths and she was coughing and coughing up sputum. Pt states the doctor said she couldn't have a xray due to pregnancy. Pt states she had a EKG and it was normal.pt was given inhaler in feb. Pt states she has still not stopped coughing since then and her lung or something hurts. Pt reports no chills, loss in taste, no sore throat, sob, diarrhea. Pt reports some braxton hicks that are every . No bleeding or lof. No FM in the last hour good FM before that. Pt reports some lower back pain as well.   Past Medical History: Past Medical History:  Diagnosis Date  . Anxiety   . Asthma   . Chest discomfort feb 2017   Pt states due to shortness of breath  . Lupus (systemic lupus erythematosus) (HCC)    dx 2010  . Pneumonia    2015  . Shortness of breath dyspnea feb 2017   changed inhaler with relief    Past obstetric history: OB History  Gravida Para Term Preterm AB Living  1            SAB TAB Ectopic Multiple Live  Births               # Outcome Date GA Lbr Len/2nd Weight Sex Delivery Anes PTL Lv  1 Current             Past Surgical History: Past Surgical History:  Procedure Laterality Date  . BRONCHOSCOPY  11/2013  . WISDOM TOOTH EXTRACTION  2014    Family History: Family History  Problem Relation Age of Onset  . Diabetes Paternal Grandmother     Social History: Social History   Tobacco Use  . Smoking status: Former Smoker    Types: Cigarettes  . Smokeless tobacco: Never Used  Substance Use Topics  . Alcohol use: Not Currently  . Drug use: Not Currently    Types: Marijuana    Allergies: No Known Allergies  Meds:  Medications Prior to Admission  Medication Sig Dispense Refill Last Dose  . acetaminophen (TYLENOL) 500 MG tablet Take 1,000 mg by mouth every 6 (six) hours as needed for moderate pain or headache.   Past Week at Unknown time  . albuterol (PROVENTIL HFA;VENTOLIN HFA) 108 (90 Base) MCG/ACT inhaler Inhale 1-2 puffs into the lungs every 6 (six) hours as needed for wheezing or shortness of breath. 1 Inhaler 0 Past Week at Unknown time  . aspirin 81 MG chewable  tablet Chew by mouth daily.   01/13/2019 at Unknown time  . hydroxychloroquine (PLAQUENIL) 200 MG tablet Take 1 tablet (200 mg total) by mouth daily. (Patient taking differently: Take 300 mg by mouth daily. ) 60 tablet 1 01/13/2019 at Unknown time  . predniSONE (DELTASONE) 5 MG tablet prednisone 5 mg tablet  TAKE 1 TABLET BY MOUTH EVERY DAY   01/13/2019 at Unknown time  . Prenatal Vit-Fe Fumarate-FA (MULTIVITAMIN-PRENATAL) 27-0.8 MG TABS tablet Take 1 tablet by mouth daily at 12 noon.   01/13/2019 at Unknown time  . ferrous gluconate (FERGON) 324 MG tablet Take 1 tablet (324 mg total) by mouth daily with breakfast. (Patient not taking: Reported on 11/24/2018) 30 tablet 3 Not Taking  . metroNIDAZOLE (METROGEL) 0.75 % vaginal gel Place 1 Applicatorful vaginally at bedtime. Apply one applicatorful to vagina at bedtime for 5  days (Patient not taking: Reported on 11/24/2018) 70 g 0 Not Taking  . polyvinyl alcohol (LIQUIFILM TEARS) 1.4 % ophthalmic solution Place 1 drop into both eyes 3 (three) times daily as needed for dry eyes.   Not Taking  . Prenat-FePoly-Metf-FA-DHA-DSS (VITAFOL FE+) 90-1-200 & 50 MG CPPK Take 1 tablet by mouth daily. (Patient not taking: Reported on 12/25/2018) 60 each 5 Not Taking  . vitamin B-12 (CYANOCOBALAMIN) 1000 MCG tablet Take 1 tablet (1,000 mcg total) by mouth daily. (Patient not taking: Reported on 04/24/2017) 90 tablet 2 Not Taking    I have reviewed patient's Past Medical Hx, Surgical Hx, Family Hx, Social Hx, medications and allergies.   ROS:  Review of Systems  Constitutional: Negative for chills and fever.  Eyes: Negative for visual disturbance.  Respiratory: Positive for cough. Negative for shortness of breath and wheezing.        Pain in left lower chest since February  Cardiovascular: Positive for chest pain (States same as pneumonia pain).  Gastrointestinal: Positive for abdominal pain (mild, with contractions). Negative for constipation, diarrhea, nausea and vomiting.  Genitourinary: Positive for pelvic pain. Negative for vaginal bleeding and vaginal discharge.  Musculoskeletal: Negative for back pain.   Other systems negative  Physical Exam   Patient Vitals for the past 24 hrs:  BP Temp Pulse  01/14/19 0700 118/85 - (!) 116  01/14/19 0635 - 99.5 F (37.5 C) -  Pulse Oximeter = 100%  Constitutional: Well-developed, well-nourished female in no acute distress.  Cardiovascular: mild tachycardia,  normal rhythm Respiratory: normal effort, clear to auscultation bilaterally, slightly diminished at bases GI: Abd soft, generally non-tender except mildly tender over right mid-abdomen, gravid appropriate for gestational age.   No rebound or guarding. MS: Extremities nontender, no edema, normal ROM Neurologic: Alert and oriented x 4.  GU: Neg CVAT.  PELVIC EXAM:     Dilation: Closed Effacement (%): Thick Cervical Position: Posterior Station: Ballotable Presentation: Undeterminable Exam by:: Mayford Knife CNM  FHT:  Baseline 150 , moderate variability, small 10-beat accelerations present, intermittent variable decelerations Contractions:  Irregular     Labs:  B/Positive/-- (02/05 1501)  Imaging:    MAU Course/MDM: I have ordered labs and reviewed results. CBC, CMET, Lactic Acid ordered. Portable CXR ordered NST reviewed, intermittent variable decels and baseline mild fetal tachycardia noted Consult Dr Adrian Blackwater with presentation, exam findings and test results.  Treatments in MAU included IV fluids, EFM, Procardia series.    Assessment: Single intrauterine pregnancy at [redacted]w[redacted]d Productive cough Left lower chest pain Low grade fever, tachycardia Fetal tachycardia Intermittent contractions, closed/long cervix  Plan: Care turned over to oncoming provider  Wynelle BourgeoisMarie Williams CNM, MSN Certified Nurse-Midwife 01/14/2019 7:18 AM   Patient received 600 mg of ibuprofen PO & Codeine cough medications Lungs CTA Chest Xray WNL After medications patient feeling much better.  Says she is using her SABA frequently however not daily.  She denies SOB; pulse ox 100% on RA  A:  1. History of asthma   2. Productive cough   3. [redacted] weeks gestation of pregnancy   4. Iron deficiency anemia during pregnancy, antepartum   5. Costochondritis     P:  Discharge home in stable condition Rx: Pulmacort, Phenergan cough syrup, Tessalon Perles, Ibuprofen x 3 days only Return to MAU with any SOB or worsening symptoms Follow up with OB as scheduled or sooner if needed.  Duane Lopeasch, Haifa Hatton I, NP 01/14/2019 12:41 PM

## 2019-01-14 NOTE — MAU Note (Signed)
Pt still having cough and rib pain like she had with the pneumonia. Also having back and abdominal pain.

## 2019-01-16 LAB — CULTURE, OB URINE
Culture: >=100000 — AB
Special Requests: NORMAL

## 2019-01-22 ENCOUNTER — Encounter (HOSPITAL_COMMUNITY): Payer: Self-pay | Admitting: Obstetrics and Gynecology

## 2019-01-25 ENCOUNTER — Other Ambulatory Visit: Payer: Self-pay | Admitting: Advanced Practice Midwife

## 2019-01-28 ENCOUNTER — Inpatient Hospital Stay (HOSPITAL_COMMUNITY)
Admission: AD | Admit: 2019-01-28 | Discharge: 2019-01-28 | Disposition: A | Discharge status: Home / Self Care | Payer: Medicaid Other | Attending: Obstetrics and Gynecology | Admitting: Obstetrics and Gynecology

## 2019-01-28 ENCOUNTER — Inpatient Hospital Stay (HOSPITAL_BASED_OUTPATIENT_CLINIC_OR_DEPARTMENT_OTHER): Payer: Medicaid Other

## 2019-01-28 ENCOUNTER — Encounter (HOSPITAL_COMMUNITY): Payer: Self-pay

## 2019-01-28 ENCOUNTER — Other Ambulatory Visit: Payer: Self-pay

## 2019-01-28 DIAGNOSIS — Z3A28 28 weeks gestation of pregnancy: Secondary | ICD-10-CM | POA: Insufficient documentation

## 2019-01-28 DIAGNOSIS — Z87891 Personal history of nicotine dependence: Secondary | ICD-10-CM | POA: Insufficient documentation

## 2019-01-28 DIAGNOSIS — N760 Acute vaginitis: Secondary | ICD-10-CM | POA: Diagnosis not present

## 2019-01-28 DIAGNOSIS — O368390 Maternal care for abnormalities of the fetal heart rate or rhythm, unspecified trimester, not applicable or unspecified: Secondary | ICD-10-CM

## 2019-01-28 DIAGNOSIS — O23593 Infection of other part of genital tract in pregnancy, third trimester: Secondary | ICD-10-CM | POA: Diagnosis not present

## 2019-01-28 DIAGNOSIS — O26853 Spotting complicating pregnancy, third trimester: Secondary | ICD-10-CM | POA: Diagnosis present

## 2019-01-28 DIAGNOSIS — O368330 Maternal care for abnormalities of the fetal heart rate or rhythm, third trimester, not applicable or unspecified: Secondary | ICD-10-CM

## 2019-01-28 DIAGNOSIS — Z7982 Long term (current) use of aspirin: Secondary | ICD-10-CM | POA: Diagnosis not present

## 2019-01-28 DIAGNOSIS — B9689 Other specified bacterial agents as the cause of diseases classified elsewhere: Secondary | ICD-10-CM | POA: Diagnosis not present

## 2019-01-28 DIAGNOSIS — O289 Unspecified abnormal findings on antenatal screening of mother: Secondary | ICD-10-CM

## 2019-01-28 HISTORY — DX: Other specified cough: R05.8

## 2019-01-28 LAB — URINALYSIS, ROUTINE W REFLEX MICROSCOPIC
Bilirubin Urine: NEGATIVE
Glucose, UA: NEGATIVE mg/dL
Hgb urine dipstick: NEGATIVE
Ketones, ur: NEGATIVE mg/dL
Leukocytes,Ua: NEGATIVE
Nitrite: NEGATIVE
Protein, ur: NEGATIVE mg/dL
Specific Gravity, Urine: 1.012 (ref 1.005–1.030)
pH: 7 (ref 5.0–8.0)

## 2019-01-28 LAB — WET PREP, GENITAL
Sperm: NONE SEEN
Trich, Wet Prep: NONE SEEN
Yeast Wet Prep HPF POC: NONE SEEN

## 2019-01-28 MED ORDER — METRONIDAZOLE 500 MG PO TABS: 500.0000 mg | ORAL_TABLET | Freq: Two times a day (BID) | ORAL | 0 refills | Status: DC

## 2019-01-28 NOTE — MAU Provider Note (Signed)
Patient Kelly [name] 23 y.o. G1P0 At [redacted]w[redacted]d here with complaints of vaginal spotting for the past two weeks. It has been coming and going; she became concerned when she had a left sided cramp in the middle of the night. She denies dysuria, decreased fetal movements, SOB, fever, NV, headache, heavy vaginal bleeding, abnormal discharge, blurry vision, RUQ or floating spots.   Her pregnancy has been complicated by lupus and pneumonia (pneumonia now resolved).   History    CSN: [id number]  Arrival date and time: 01/28/19 1015   First Provider Initiated Contact with Patient 01/28/19 1144      Chief Complaint  Patient presents with  . Abdominal Pain  . Vaginal Bleeding   Abdominal Pain  This is a new problem. The current episode started today. The onset quality is sudden. The problem occurs intermittently. The problem has been resolved. The pain is located in the LUQ. The pain is at a severity of 2/10. The quality of the pain is cramping. The abdominal pain does not radiate.  Vaginal Bleeding  The patient's primary symptoms include vaginal bleeding. The patient's pertinent negatives include no genital itching, pelvic pain or vaginal discharge. Primary symptoms comment: a few drops of blood occasionally over the past two weeks.  Was not concerned until her side hurt once last night.  Based on these symptoms , she came to be checked out. . This is a new problem. The current episode started 1 to 4 weeks ago. The problem occurs intermittently. The problem has been unchanged. Associated symptoms include abdominal pain. The vaginal discharge was bloody. The vaginal bleeding is spotting. She has not been passing clots. She has not been passing tissue.    OB History    Gravida  1   Para      Term      Preterm      AB      Living        SAB      TAB      Ectopic      Multiple      Live Births              Past Medical History:  Diagnosis Date  . Anxiety   . Asthma   . Chest  discomfort feb 2017   Pt states due to shortness of breath  . Cough present for greater than 3 weeks   . Lupus (systemic lupus erythematosus) (HCC)    dx 2010  . Pneumonia    2015  . Shortness of breath dyspnea feb 2017   changed inhaler with relief    Past Surgical History:  Procedure Laterality Date  . BRONCHOSCOPY  11/2013  . WISDOM TOOTH EXTRACTION  2014    Family History  Problem Relation Age of Onset  . Diabetes Paternal Grandmother     Social History   Tobacco Use  . Smoking status: Former Smoker    Types: Cigarettes  . Smokeless tobacco: Never Used  Substance Use Topics  . Alcohol use: Not Currently  . Drug use: Not Currently    Types: Marijuana    Allergies: No Known Allergies  Medications Prior to Admission  Medication Sig Dispense Refill Last Dose  . acetaminophen (TYLENOL) 500 MG tablet Take 1,000 mg by mouth every 6 (six) hours as needed for moderate pain or headache.   Past Week at Unknown time  . albuterol (VENTOLIN HFA) 108 (90 Base) MCG/ACT inhaler INHALE 1-2 PUFFS INTO THE LUNGS EVERY 6 (  SIX) HOURS AS NEEDED FOR WHEEZING OR SHORTNESS OF BREATH. 6.7 Inhaler 0 Past Week at Unknown time  . aspirin 81 MG chewable tablet Chew by mouth daily.   01/28/2019 at Unknown time  . benzonatate (TESSALON PERLES) 100 MG capsule Take 2 capsules (200 mg total) by mouth every 6 (six) hours as needed for cough. 30 capsule 0 Past Week at Unknown time  . hydroxychloroquine (PLAQUENIL) 200 MG tablet Take 1 tablet (200 mg total) by mouth daily. (Patient taking differently: Take 300 mg by mouth daily. ) 60 tablet 1 01/27/2019 at Unknown time  . albuterol (VENTOLIN HFA) 108 (90 Base) MCG/ACT inhaler Ventolin HFA 90 mcg/actuation aerosol inhaler  INHALE 1 TO 2 PUFFS INTO THE LUNGS EVERY 6 HOURS AS NEEDED FOR WHEEZING OR SHORTNESS OF BREATH     . Budesonide 90 MCG/ACT inhaler Inhale 1 puff into the lungs 2 (two) times daily. 1 Inhaler 1   . cyclobenzaprine (FLEXERIL) 10 MG tablet  cyclobenzaprine 10 mg tablet  1/2-1 TABLET AS NEEDED THREE TIMES A DAY ORALLY 30 DAYS     . ferrous gluconate (FERGON) 324 MG tablet Take 1 tablet (324 mg total) by mouth daily with breakfast. (Patient not taking: Reported on 11/24/2018) 30 tablet 3 Not Taking  . hydroxychloroquine (PLAQUENIL) 200 MG tablet hydroxychloroquine 200 mg tablet  2 TABLET WITH FOOD OR MILK ONCE A DAY     . ibuprofen (ADVIL) 600 MG tablet Take 1 tablet (600 mg total) by mouth every 6 (six) hours as needed for mild pain. 10 tablet 0   . metroNIDAZOLE (METROGEL) 0.75 % vaginal gel Place 1 Applicatorful vaginally at bedtime. Apply one applicatorful to vagina at bedtime for 5 days (Patient not taking: Reported on 11/24/2018) 70 g 0 Not Taking  . polyvinyl alcohol (LIQUIFILM TEARS) 1.4 % ophthalmic solution Place 1 drop into both eyes 3 (three) times daily as needed for dry eyes.   Not Taking  . predniSONE (DELTASONE) 5 MG tablet Take 5 mg by mouth daily.    01/13/2019 at Unknown time  . Prenat-FePoly-Metf-FA-DHA-DSS (VITAFOL FE+) 90-1-200 & 50 MG CPPK Take 1 tablet by mouth daily. (Patient not taking: Reported on 12/25/2018) 60 each 5 Not Taking  . Prenatal Vit-Fe Fumarate-FA (MULTIVITAMIN-PRENATAL) 27-0.8 MG TABS tablet Take 1 tablet by mouth daily at 12 noon.   01/13/2019 at Unknown time  . promethazine (PHENERGAN) 6.25 MG/5ML syrup Take 5 mLs (6.25 mg total) by mouth 4 (four) times daily as needed for nausea or vomiting. 120 mL 1   . vitamin B-12 (CYANOCOBALAMIN) 1000 MCG tablet Take 1 tablet (1,000 mcg total) by mouth daily. (Patient not taking: Reported on 04/24/2017) 90 tablet 2 Not Taking    Review of Systems  Gastrointestinal: Positive for abdominal pain.  Genitourinary: Positive for vaginal bleeding. Negative for pelvic pain and vaginal discharge.   Physical Exam   Blood pressure 121/71, pulse (!) 117, temperature 98 F (36.7 C), resp. rate 16, last menstrual period 07/13/2018, SpO2 100 %.  Physical Exam   Constitutional: She is oriented to person, place, and time. She appears well-developed and well-nourished.  HENT:  Head: Normocephalic.  Neck: Normal range of motion.  GI: Soft.  Genitourinary:    Vagina normal.     Genitourinary Comments: NEFG: no discharge in the vagina. Small amount of bright red blood when cervix is touched with speculum but no blood extruding from the cervical os. Cervix is long, closed, posterior, no CMT, suprapubic or adnexal tenderness.    Musculoskeletal:  Normal range of motion.  Neurological: She is alert and oriented to person, place, and time.  Skin: Skin is warm and dry.    MAU Course  Procedures  MDM NST: Overall mod var, present acels, two decelerations, no contractions and FHR is baseline 145. Patient feels strong fetal movement in MAU.  -Bpp after two decelerations in MAU; BPP 6/10 (-2 for breathing and -2 for NST). MFM recommends repeat BPP in 24 hours.  -wet prep positive for clue; GC pending.  -No contractions while in MAU; no bleeding. Low suspicion for abruption or other ob emergency at this time given no current pain or bleeding while in MAU, and BPP and NST are overall reassuring for 28 weeks.   Assessment and Plan   1. Bacterial vaginosis   2. Non-reassuring fetal heart rate or rhythm affecting mother    2. Patient stable for discharge with RX for BV; plan to keep ROB with GTT in the morning.   3. Called MFM on patient's behalf and scheduled BPP at 1:15; patient is aware of appt and plans to keep it.   4. Return precautions reviewed; all questions answered. Patient stable for discharge.   Charlesetta Garibaldi Kooistra 01/28/2019, 1:16 PM

## 2019-01-28 NOTE — MAU Note (Signed)
Kelly [name] is a 23 y.o. at [redacted]w[redacted]d here in MAU reporting: vaginal spotting with lower abdominal cramping for a week.   Onset of complaint: 1 week ago Pain score:6 Vitals:   01/28/19 1032  BP: 121/71  Pulse: (!) 117  Resp: 16  Temp: 98 F (36.7 C)  SpO2: 100%     FHT:156 Lab *orders placed from triage: UA

## 2019-01-28 NOTE — Discharge Instructions (Signed)

## 2019-01-29 ENCOUNTER — Other Ambulatory Visit: Payer: Medicaid Other

## 2019-01-29 ENCOUNTER — Other Ambulatory Visit (HOSPITAL_COMMUNITY): Payer: Self-pay | Admitting: Student

## 2019-01-29 ENCOUNTER — Encounter: Payer: Self-pay | Admitting: Obstetrics and Gynecology

## 2019-01-29 ENCOUNTER — Ambulatory Visit (HOSPITAL_COMMUNITY)
Admission: RE | Admit: 2019-01-29 | Discharge: 2019-01-29 | Disposition: A | Discharge status: Home / Self Care | Payer: Medicaid Other | Source: Ambulatory Visit | Attending: Family Medicine | Admitting: Family Medicine

## 2019-01-29 ENCOUNTER — Ambulatory Visit (INDEPENDENT_AMBULATORY_CARE_PROVIDER_SITE_OTHER): Payer: Medicaid Other | Admitting: Obstetrics and Gynecology

## 2019-01-29 ENCOUNTER — Other Ambulatory Visit: Payer: Self-pay

## 2019-01-29 VITALS — BP 111/77 | HR 106 | Wt 140.0 lb

## 2019-01-29 DIAGNOSIS — O99113 Other diseases of the blood and blood-forming organs and certain disorders involving the immune mechanism complicating pregnancy, third trimester: Secondary | ICD-10-CM | POA: Diagnosis present

## 2019-01-29 DIAGNOSIS — O289 Unspecified abnormal findings on antenatal screening of mother: Secondary | ICD-10-CM | POA: Diagnosis not present

## 2019-01-29 DIAGNOSIS — D6862 Lupus anticoagulant syndrome: Secondary | ICD-10-CM

## 2019-01-29 DIAGNOSIS — Z3A28 28 weeks gestation of pregnancy: Secondary | ICD-10-CM | POA: Diagnosis not present

## 2019-01-29 DIAGNOSIS — M329 Systemic lupus erythematosus, unspecified: Secondary | ICD-10-CM | POA: Diagnosis not present

## 2019-01-29 DIAGNOSIS — Z3400 Encounter for supervision of normal first pregnancy, unspecified trimester: Secondary | ICD-10-CM

## 2019-01-29 DIAGNOSIS — O99013 Anemia complicating pregnancy, third trimester: Secondary | ICD-10-CM

## 2019-01-29 DIAGNOSIS — O9989 Other specified diseases and conditions complicating pregnancy, childbirth and the puerperium: Secondary | ICD-10-CM

## 2019-01-29 DIAGNOSIS — O26893 Other specified pregnancy related conditions, third trimester: Secondary | ICD-10-CM | POA: Diagnosis not present

## 2019-01-29 DIAGNOSIS — O99019 Anemia complicating pregnancy, unspecified trimester: Secondary | ICD-10-CM

## 2019-01-29 DIAGNOSIS — D509 Iron deficiency anemia, unspecified: Secondary | ICD-10-CM

## 2019-01-29 NOTE — Progress Notes (Signed)
   PRENATAL VISIT NOTE  Subjective:  Kelly [name] is a 23 y.o. G1P0 at [redacted]w[redacted]d being seen today for ongoing prenatal care.  She is currently monitored for the following issues for this high-risk pregnancy and has Supervision of normal first pregnancy, antepartum; Systemic lupus erythematosus (SLE) affecting pregnancy, antepartum (HCC); Iron deficiency anemia during pregnancy, antepartum; and Late Entry to Babyscripts- April 2020- Social Distancing  on their problem list.  Patient reports some pinkish spotting on pad this am, not enough to fill a pantyliner. Seen yesterday in MAU for bleeding..  Contractions: Not present. Vag. Bleeding: Scant.  Movement: Present. Denies leaking of fluid.   The following portions of the patient's history were reviewed and updated as appropriate: allergies, current medications, past family history, past medical history, past social history, past surgical history and problem list.   Objective:   Vitals:   01/29/19 0927  BP: 111/77  Pulse: (!) 106  Weight: 140 lb (63.5 kg)    Fetal Status: Fetal Heart Rate (bpm): 145   Movement: Present     General:  Alert, oriented and cooperative. Patient is in no acute distress.  Skin: Skin is warm and dry. No rash noted.   Cardiovascular: Normal heart rate noted  Respiratory: Normal respiratory effort, no problems with respiration noted  Abdomen: Soft, gravid, appropriate for gestational age.  Pain/Pressure: Present     Pelvic: Cervical exam deferred        Extremities: Normal range of motion.     Mental Status: Normal mood and affect. Normal behavior. Normal judgment and thought content.   Assessment and Plan:  Pregnancy: G1P0 at [redacted]w[redacted]d  1. Supervision of normal first pregnancy, antepartum - 2 hr GTT - CBC/HIV/RPR - patient reports she had it done 01/24/19  2. Systemic lupus erythematosus (SLE) affecting pregnancy, antepartum (HCC) - Stable, no recent flares - Has had normal fetal echo - Next appt with  Rheumatology next week  3. Iron deficiency anemia during pregnancy, antepartum Not taking iron pills encouraged her to take them  Patient had 6/10 BPP yesterday when seen at MAU, recommended repeat BPP 24 hours. She has appointment for 1 pm this afternoon at MFM, reviewed importance of attending, she verbalizes understanding.    Preterm labor symptoms and general obstetric precautions including but not limited to vaginal bleeding, contractions, leaking of fluid and fetal movement were reviewed in detail with the patient. Please refer to After Visit Summary for other counseling recommendations.   Return in about 2 weeks (around 02/12/2019) for OB visit (MD), virtual.  Future Appointments  Date Time Provider Department Center  01/29/2019  1:15 PM [facility] [place] 4 [facility]  02/24/2019 10:30 AM [facility] NURSE [facility]  02/24/2019 10:30 AM [facility] [place] 5 [facility]    Conan Bowens, MD

## 2019-01-30 ENCOUNTER — Other Ambulatory Visit: Payer: Self-pay | Admitting: Obstetrics

## 2019-01-30 LAB — GC/CHLAMYDIA PROBE AMP (~~LOC~~) NOT AT ARMC
Chlamydia: NEGATIVE
Neisseria Gonorrhea: NEGATIVE

## 2019-01-30 LAB — CBC
Hematocrit: 27.5 % — ABNORMAL LOW (ref 34.0–46.6)
Hemoglobin: 9.1 g/dL — ABNORMAL LOW (ref 11.1–15.9)
MCH: 29.8 pg (ref 26.6–33.0)
MCHC: 33.1 g/dL (ref 31.5–35.7)
MCV: 90 fL (ref 79–97)
Platelets: 351 10*3/uL (ref 150–450)
RBC: 3.05 x10E6/uL — ABNORMAL LOW (ref 3.77–5.28)
RDW: 13.9 % (ref 11.7–15.4)
WBC: 5.3 10*3/uL (ref 3.4–10.8)

## 2019-01-30 LAB — GLUCOSE TOLERANCE, 2 HOURS W/ 1HR
Glucose, 1 hour: 97 mg/dL (ref 65–179)
Glucose, 2 hour: 82 mg/dL (ref 65–152)
Glucose, Fasting: 67 mg/dL (ref 65–91)

## 2019-01-30 LAB — RPR: RPR Ser Ql: NONREACTIVE

## 2019-01-30 LAB — HIV ANTIBODY (ROUTINE TESTING W REFLEX): HIV Screen 4th Generation wRfx: NONREACTIVE

## 2019-02-12 ENCOUNTER — Encounter: Payer: Self-pay | Admitting: Obstetrics

## 2019-02-12 ENCOUNTER — Telehealth (INDEPENDENT_AMBULATORY_CARE_PROVIDER_SITE_OTHER): Payer: Medicaid Other | Admitting: Obstetrics

## 2019-02-12 VITALS — BP 115/75 | HR 107 | Ht 62.0 in

## 2019-02-12 DIAGNOSIS — O0990 Supervision of high risk pregnancy, unspecified, unspecified trimester: Secondary | ICD-10-CM

## 2019-02-12 DIAGNOSIS — M329 Systemic lupus erythematosus, unspecified: Secondary | ICD-10-CM

## 2019-02-12 DIAGNOSIS — Z3A30 30 weeks gestation of pregnancy: Secondary | ICD-10-CM

## 2019-02-12 DIAGNOSIS — O9989 Other specified diseases and conditions complicating pregnancy, childbirth and the puerperium: Secondary | ICD-10-CM

## 2019-02-12 DIAGNOSIS — O0993 Supervision of high risk pregnancy, unspecified, third trimester: Secondary | ICD-10-CM

## 2019-02-12 NOTE — Progress Notes (Signed)
ROB.  C/o pressure and back pain.

## 2019-02-12 NOTE — Progress Notes (Signed)
TELEHEALTH OBSTETRICS PRENATAL VIRTUAL VIDEO VISIT ENCOUNTER NOTE  Provider location: Center for [facility] [place]   I connected with Kelly [name] on 02/12/19 at 10:15 AM EDT by WebEx OB MyChart Video Encounter at home and verified that I am speaking with the correct person using two identifiers.   I discussed the limitations, risks, security and privacy concerns of performing an evaluation and management service by telephone and the availability of in person appointments. I also discussed with the patient that there may be a patient responsible charge related to this service. The patient expressed understanding and agreed to proceed. Subjective:  Kelly [name] is a 23 y.o. G1P0 at [redacted]w[redacted]d being seen today for ongoing prenatal care.  She is currently monitored for the following issues for this high-risk pregnancy and has Supervision of normal first pregnancy, antepartum; Systemic lupus erythematosus (SLE) affecting pregnancy, antepartum (HCC); Iron deficiency anemia during pregnancy, antepartum; and Late Entry to Babyscripts- April 2020- Social Distancing  on their problem list.  Patient reports backache.   .  .   . Denies any leaking of fluid.   The following portions of the patient's history were reviewed and updated as appropriate: allergies, current medications, past family history, past medical history, past social history, past surgical history and problem list.   Objective:   Vitals:   02/12/19 1008  BP: 115/75  Pulse: (!) 107  Height:  (1.575 m)    Fetal Status:           General:  Alert, oriented and cooperative. Patient is in no acute distress.  Respiratory: Normal respiratory effort, no problems with respiration noted  Mental Status: Normal mood and affect. Normal behavior. Normal judgment and thought content.  Rest of physical exam deferred due to type of encounter  Imaging: Dg Chest Port 1 View  Result Date: 01/14/2019 CLINICAL DATA:  Cough and  fever EXAM: PORTABLE CHEST 1 VIEW COMPARISON:  April 24, 2017 FINDINGS: No edema or consolidation. Heart size and pulmonary vascularity are normal. No adenopathy. No bone lesions. IMPRESSION: No edema or consolidation. Electronically Signed   By: Bretta Bang III M.D.   On: 01/14/2019 08:32   [place] Mfm Fetal Bpp Wo Non Stress  Result Date: 01/29/2019 ----------------------------------------------------------------------  OBSTETRICS REPORT                       (Signed Final 01/29/2019 01:53 pm) ---------------------------------------------------------------------- Patient Info  ID #:       [id number]                          D.O.B.:  06-02-1996 (22 yrs)  Name:       Kelly [name]                Visit Date: 01/29/2019 01:39 pm ---------------------------------------------------------------------- Performed By  Performed By:     Kelly [name]          Ref. Address:     [address]                    [place]                                                             [place] [id number]  Attending:        Noralee Spaceavi Shankar MD        [facility]  Referred By:      Kelly [name]                    Kelly [name] ---------------------------------------------------------------------- Orders   #  Description                          Code         Ordered By   1  US MFM FETAL BPP WO NON              [id number]     KATHRYN Kelly      STRESS  ----------------------------------------------------------------------   #  Order #                    Accession #                 Episode #   1  [id number]                  [id number]                  [id number]  ---------------------------------------------------------------------- Indications   Abnormal finding on antenatal screening        O28.9   (6/8 BPP yesterday)   [redacted] weeks gestation of pregnancy                Z3A.28    Systemic lupus complicating pregnancy,         O26.893, M32.9   third trimester  ---------------------------------------------------------------------- Vital Signs                                                 Height:        5'2" ---------------------------------------------------------------------- Fetal Evaluation  Num Of Fetuses:         1  Fetal Heart Rate(bpm):  150  Cardiac Activity:       Observed  Presentation:           Cephalic  Placenta:               Anterior  Amniotic Fluid  AFI FV:      Within normal limits  AFI Sum(cm)     %Tile       Largest Pocket(cm)  16.4            60          6.8  RUQ(cm)       RLQ(cm)       LUQ(cm)        LLQ(cm)  6.8           4.5           3              2.1  Comment:    8/8 BPP In 7 minutes. ---------------------------------------------------------------------- Biophysical Evaluation  Amniotic F.V:   Within normal limits       F. Tone:        Observed  F. Movement:    Observed                   Score:          8/8  F. Breathing:   Observed ---------------------------------------------------------------------- OB History  Gravidity:    1 ---------------------------------------------------------------------- Gestational Age  LMP:           28w 4d        Date:  07/13/18                 EDD:   04/19/19  Best:          Kelly [name] 4d     Det. By:  LMP  (07/13/18)          EDD:   04/19/19 ---------------------------------------------------------------------- Anatomy  Stomach:               Appears normal, left   Bladder:                Appears normal                         sided ---------------------------------------------------------------------- Impression  Patient returned for BPP. She had nonreassuring fetal testing  yesterday.  Amniotic fluid is normal and good fetal activity is seen.  Antenatal testing is reassuring. BPP 8/8.  Her pregnancy is complicated by SLE and she takes  plaquenil. BP today 112/60 mm Hg. She has no flares or  complications of lupus.  ---------------------------------------------------------------------- Recommendations  An appointment was made for her to return in 4 weeks for  fetal growth assessment. ----------------------------------------------------------------------                  Tama High, MD Electronically Signed Final Report   01/29/2019 01:53 pm ----------------------------------------------------------------------  [place] Mfm Fetal Bpp Wo Non Stress  Result Date: 01/28/2019 ----------------------------------------------------------------------  OBSTETRICS REPORT                       (Signed Final 01/28/2019 03:07 pm) ---------------------------------------------------------------------- Patient Info  ID #:       [id number]                          D.O.B.:  May 28, 1996 (22 yrs)  Name:       MARISSIA BLACKHAM [name]                Visit Date: 01/28/2019 02:22 pm ---------------------------------------------------------------------- Performed By  Performed By:     Berlinda Last          Ref. Address:     [place]                                                             [place] [id number]  Attending:        Lin Landsmanorenthian Booker      Secondary Phy.:   MAU Nursing-                    MD                                                             MAU/Triage  Referred By:      Nat ChristenKATHRYN [name]          [facility]                    Kelly [name]                             Fetal Care ---------------------------------------------------------------------- Orders   #  Description                          Code         Ordered By   1  US MFM FETAL BPP WO NON              76819.01     KATHRYN Kelly      STRESS  ----------------------------------------------------------------------   #  Order #                    Accession #                 Episode #   1  [id number]                  [id number]                  [id number]   ---------------------------------------------------------------------- Indications   Non-reactive NST, FHR decelerations            O28.9   [redacted] weeks gestation of pregnancy                Z3A.28  ---------------------------------------------------------------------- Vital Signs                                                 Height:        5'2" ---------------------------------------------------------------------- Fetal Evaluation  Num Of Fetuses:         1  Fetal Heart Rate(bpm):  153  Cardiac Activity:       Observed  Presentation:           Cephalic  Placenta:               Anterior  Amniotic Fluid  AFI FV:      Within normal limits  AFI  Sum(cm)     %Tile       Largest Pocket(cm)  18.6            72          6.6  RUQ(cm)       RLQ(cm)       LUQ(cm)        LLQ(cm)  5.6           2.1           6.6            4.3 ---------------------------------------------------------------------- Biophysical Evaluation  Amniotic F.V:   Within normal limits       F. Tone:        Observed  F. Movement:    Observed                   Score:          6/8  F. Breathing:   Not Observed ---------------------------------------------------------------------- OB History  Gravidity:    1 ---------------------------------------------------------------------- Gestational Age  LMP:           28w 3d        Date:  07/13/18                 EDD:   04/19/19  Best:          Eden Emms 3d     Det. By:  LMP  (07/13/18)          EDD:   04/19/19 ---------------------------------------------------------------------- Anatomy  Stomach:               Appears normal, left   Bladder:                Appears normal                         sided ---------------------------------------------------------------------- Impression  NR-NST  Biophysical profile 6/10 -2 for breathing ---------------------------------------------------------------------- Recommendations  Consider repeat testing in 24 hours. ----------------------------------------------------------------------                Lin Landsman, MD Electronically Signed Final Report   01/28/2019 03:07 pm ----------------------------------------------------------------------   Media Information             Document Information  Procedure Note  [facility]  02/05/2019 15:40  Attached To:  Procedure Report - Scanned [[id number]]  Scanned Document on 02/05/19 with [provider]  Source Information  Almon Register D   [facility]    Assessment and Plan:  Pregnancy: G1P0 at [redacted]w[redacted]d 1. Supervision of high risk pregnancy, antepartum   2. Systemic lupus erythematosus (SLE) affecting pregnancy, antepartum (HCC) - clinically stable.  Followed by Dr. Zenovia Jordan, Rheumatology, last visit on 61-2020, and Lupus assessment with pregnancy was good.  Recommended continuation of current management of Plaquenil and Prednisone and Baby ASA  Preterm labor symptoms and general obstetric precautions including but not limited to vaginal bleeding, contractions, leaking of fluid and fetal movement were reviewed in detail with the patient. I discussed the assessment and treatment plan with the patient. The patient was provided an opportunity to ask questions and all were answered. The patient agreed with the plan and demonstrated an understanding of the instructions. The patient was advised to call back or seek an in-person office evaluation/go [facility] at [facility] for any urgent or concerning symptoms. Please refer to After Visit Summary for other counseling recommendations.   I provided  10 minutes of face-to-face time during this encounter.  [facility]2019[facility]   Future Appointments  Date Time Provider Department Center  02/24/2019 10:30 AM [facility]  02/24/2019 10:30 AM [facility] US 5 [facility]    Coral Ceoharles Asser Lucena, MD [facility] 02-12-2019

## 2019-02-24 ENCOUNTER — Inpatient Hospital Stay (HOSPITAL_COMMUNITY)
Admission: AD | Admit: 2019-02-24 | Discharge: 2019-02-24 | Disposition: A | Discharge status: Home / Self Care | Payer: Medicaid Other | Attending: Obstetrics and Gynecology | Admitting: Obstetrics and Gynecology

## 2019-02-24 ENCOUNTER — Other Ambulatory Visit (HOSPITAL_COMMUNITY): Payer: Self-pay | Admitting: *Deleted

## 2019-02-24 ENCOUNTER — Other Ambulatory Visit: Payer: Self-pay

## 2019-02-24 ENCOUNTER — Encounter (HOSPITAL_COMMUNITY): Payer: Self-pay | Admitting: *Deleted

## 2019-02-24 ENCOUNTER — Inpatient Hospital Stay (HOSPITAL_BASED_OUTPATIENT_CLINIC_OR_DEPARTMENT_OTHER): Payer: Medicaid Other

## 2019-02-24 ENCOUNTER — Encounter (HOSPITAL_COMMUNITY): Payer: Self-pay

## 2019-02-24 ENCOUNTER — Ambulatory Visit (HOSPITAL_COMMUNITY): Payer: BC Managed Care – PPO | Admitting: *Deleted

## 2019-02-24 ENCOUNTER — Inpatient Hospital Stay (HOSPITAL_COMMUNITY): Payer: Medicaid Other

## 2019-02-24 ENCOUNTER — Ambulatory Visit (HOSPITAL_COMMUNITY)
Admission: RE | Admit: 2019-02-24 | Discharge: 2019-02-24 | Disposition: A | Discharge status: Home / Self Care | Payer: BC Managed Care – PPO | Source: Ambulatory Visit | Attending: Obstetrics and Gynecology | Admitting: Obstetrics and Gynecology

## 2019-02-24 VITALS — BP 120/76 | HR 91 | Temp 98.1°F

## 2019-02-24 DIAGNOSIS — O288 Other abnormal findings on antenatal screening of mother: Secondary | ICD-10-CM | POA: Diagnosis not present

## 2019-02-24 DIAGNOSIS — D509 Iron deficiency anemia, unspecified: Secondary | ICD-10-CM | POA: Diagnosis not present

## 2019-02-24 DIAGNOSIS — O0990 Supervision of high risk pregnancy, unspecified, unspecified trimester: Secondary | ICD-10-CM | POA: Diagnosis present

## 2019-02-24 DIAGNOSIS — R1011 Right upper quadrant pain: Secondary | ICD-10-CM | POA: Diagnosis present

## 2019-02-24 DIAGNOSIS — Z7982 Long term (current) use of aspirin: Secondary | ICD-10-CM | POA: Diagnosis not present

## 2019-02-24 DIAGNOSIS — O26893 Other specified pregnancy related conditions, third trimester: Secondary | ICD-10-CM | POA: Insufficient documentation

## 2019-02-24 DIAGNOSIS — M549 Dorsalgia, unspecified: Secondary | ICD-10-CM

## 2019-02-24 DIAGNOSIS — E876 Hypokalemia: Secondary | ICD-10-CM

## 2019-02-24 DIAGNOSIS — N1330 Unspecified hydronephrosis: Secondary | ICD-10-CM

## 2019-02-24 DIAGNOSIS — O99283 Endocrine, nutritional and metabolic diseases complicating pregnancy, third trimester: Secondary | ICD-10-CM | POA: Diagnosis not present

## 2019-02-24 DIAGNOSIS — Z3A32 32 weeks gestation of pregnancy: Secondary | ICD-10-CM

## 2019-02-24 DIAGNOSIS — Z87891 Personal history of nicotine dependence: Secondary | ICD-10-CM | POA: Insufficient documentation

## 2019-02-24 DIAGNOSIS — R102 Pelvic and perineal pain: Secondary | ICD-10-CM

## 2019-02-24 DIAGNOSIS — M329 Systemic lupus erythematosus, unspecified: Secondary | ICD-10-CM | POA: Insufficient documentation

## 2019-02-24 DIAGNOSIS — O99013 Anemia complicating pregnancy, third trimester: Secondary | ICD-10-CM | POA: Diagnosis not present

## 2019-02-24 DIAGNOSIS — Z362 Encounter for other antenatal screening follow-up: Secondary | ICD-10-CM | POA: Diagnosis not present

## 2019-02-24 DIAGNOSIS — O99019 Anemia complicating pregnancy, unspecified trimester: Secondary | ICD-10-CM

## 2019-02-24 DIAGNOSIS — D6862 Lupus anticoagulant syndrome: Secondary | ICD-10-CM

## 2019-02-24 HISTORY — DX: Unspecified hydronephrosis: N13.30

## 2019-02-24 LAB — URINALYSIS, ROUTINE W REFLEX MICROSCOPIC
Bilirubin Urine: NEGATIVE
Glucose, UA: NEGATIVE mg/dL
Hgb urine dipstick: NEGATIVE
Ketones, ur: NEGATIVE mg/dL
Leukocytes,Ua: NEGATIVE
Nitrite: NEGATIVE
Protein, ur: NEGATIVE mg/dL
Specific Gravity, Urine: 1.009 (ref 1.005–1.030)
pH: 7 (ref 5.0–8.0)

## 2019-02-24 LAB — CBC WITH DIFFERENTIAL/PLATELET
Abs Immature Granulocytes: 0.27 10*3/uL — ABNORMAL HIGH (ref 0.00–0.07)
Basophils Absolute: 0 10*3/uL (ref 0.0–0.1)
Basophils Relative: 0 %
Eosinophils Absolute: 0.1 10*3/uL (ref 0.0–0.5)
Eosinophils Relative: 1 %
HCT: 25 % — ABNORMAL LOW (ref 36.0–46.0)
Hemoglobin: 8.3 g/dL — ABNORMAL LOW (ref 12.0–15.0)
Immature Granulocytes: 4 %
Lymphocytes Relative: 6 %
Lymphs Abs: 0.5 10*3/uL — ABNORMAL LOW (ref 0.7–4.0)
MCH: 29.5 pg (ref 26.0–34.0)
MCHC: 33.2 g/dL (ref 30.0–36.0)
MCV: 89 fL (ref 80.0–100.0)
Monocytes Absolute: 0.3 10*3/uL (ref 0.1–1.0)
Monocytes Relative: 3 %
Neutro Abs: 6.7 10*3/uL (ref 1.7–7.7)
Neutrophils Relative %: 86 %
Platelets: 319 10*3/uL (ref 150–400)
RBC: 2.81 MIL/uL — ABNORMAL LOW (ref 3.87–5.11)
RDW: 16 % — ABNORMAL HIGH (ref 11.5–15.5)
WBC: 7.8 10*3/uL (ref 4.0–10.5)
nRBC: 0 % (ref 0.0–0.2)

## 2019-02-24 LAB — LIPASE, BLOOD: Lipase: 29 U/L (ref 11–51)

## 2019-02-24 LAB — COMPREHENSIVE METABOLIC PANEL
ALT: 18 U/L (ref 0–44)
AST: 41 U/L (ref 15–41)
Albumin: 1.9 g/dL — ABNORMAL LOW (ref 3.5–5.0)
Alkaline Phosphatase: 78 U/L (ref 38–126)
Anion gap: 6 (ref 5–15)
BUN: <5 mg/dL — ABNORMAL LOW (ref 6–20)
CO2: 22 mmol/L (ref 22–32)
Calcium: 7.6 mg/dL — ABNORMAL LOW (ref 8.9–10.3)
Chloride: 109 mmol/L (ref 98–111)
Creatinine, Ser: 0.52 mg/dL (ref 0.44–1.00)
GFR calc Af Amer: >60 mL/min (ref >60)
GFR calc non Af Amer: >60 mL/min (ref >60)
Glucose, Bld: 104 mg/dL — ABNORMAL HIGH (ref 70–99)
Potassium: 2.9 mmol/L — ABNORMAL LOW (ref 3.5–5.1)
Sodium: 137 mmol/L (ref 135–145)
Total Bilirubin: 0.4 mg/dL (ref 0.3–1.2)
Total Protein: 6 g/dL — ABNORMAL LOW (ref 6.5–8.1)

## 2019-02-24 LAB — AMYLASE: Amylase: 84 U/L (ref 28–100)

## 2019-02-24 MED ORDER — HYDROMORPHONE HCL 1 MG/ML IJ SOLN: 1.0000 mg | Freq: Once | INTRAMUSCULAR | Status: DC

## 2019-02-24 MED ORDER — FERROUS SULFATE 325 (65 FE) MG PO TABS: 325.0000 mg | ORAL_TABLET | Freq: Two times a day (BID) | ORAL | 2 refills | Status: DC

## 2019-02-24 MED ORDER — POTASSIUM CHLORIDE ER 10 MEQ PO TBCR: 10.0000 meq | EXTENDED_RELEASE_TABLET | Freq: Two times a day (BID) | ORAL | 0 refills | Status: DC

## 2019-02-24 NOTE — Progress Notes (Signed)
Pt reports a constant, right upper quadrant pain that wraps around to her back since yesterday.  Denies bleeding and leaking, reports good fetal movement.

## 2019-02-24 NOTE — Discharge Instructions (Signed)
Fetal Movement Counts Patient Name: ________________________________________________ Patient Due Date: ____________________ What is a fetal movement count?  A fetal movement count is the number of times that you feel your baby move during a certain amount of time. This may also be called a fetal kick count. A fetal movement count is recommended for every pregnant woman. You may be asked to start counting fetal movements as early as week 28 of your pregnancy. Pay attention to when your baby is most active. You may notice your baby's sleep and wake cycles. You may also notice things that make your baby move more. You should do a fetal movement count:  When your baby is normally most active.  At the same time each day. A good time to count movements is while you are resting, after having something to eat and drink. How do I count fetal movements? 1. Find a quiet, comfortable area. Sit, or lie down on your side. 2. Write down the date, the start time and stop time, and the number of movements that you felt between those two times. Take this information with you to your health care visits. 3. For 2 hours, count kicks, flutters, swishes, rolls, and jabs. You should feel at least 10 movements during 2 hours. 4. You may stop counting after you have felt 10 movements. 5. If you do not feel 10 movements in 2 hours, have something to eat and drink. Then, keep resting and counting for 1 hour. If you feel at least 4 movements during that hour, you may stop counting. Contact a health care provider if:  You feel fewer than 4 movements in 2 hours.  Your baby is not moving like he or she usually does. Date: ____________ Start time: ____________ Stop time: ____________ Movements: ____________ Date: ____________ Start time: ____________ Stop time: ____________ Movements: ____________ Date: ____________ Start time: ____________ Stop time: ____________ Movements: ____________ Date: ____________ Start time:  ____________ Stop time: ____________ Movements: ____________ Date: ____________ Start time: ____________ Stop time: ____________ Movements: ____________ Date: ____________ Start time: ____________ Stop time: ____________ Movements: ____________ Date: ____________ Start time: ____________ Stop time: ____________ Movements: ____________ Date: ____________ Start time: ____________ Stop time: ____________ Movements: ____________ Date: ____________ Start time: ____________ Stop time: ____________ Movements: ____________ This information is not intended to replace advice given to you by your health care provider. Make sure you discuss any questions you have with your health care provider. Document Released: 09/12/2006 Document Revised: 09/02/2018 Document Reviewed: 09/22/2015 Elsevier Patient Education  2020 Elsevier Inc. Biliary Colic, Adult (Gallbladder Pain)  Biliary colic is severe pain caused by a problem with a small organ in the upper right part of your belly (gallbladder). The gallbladder stores a digestive fluid produced in the liver (bile) that helps the body break down fat. Bile and other digestive enzymes are carried from the liver to the small intestine through tube-like structures (bile ducts). The gallbladder and the bile ducts form the biliary tract. Sometimes hard deposits of digestive fluids form in the gallbladder (gallstones) and block the flow of bile from the gallbladder, causing biliary colic. This condition is also called a gallbladder attack. Gallstones can be as small as a grain of sand or as big as a golf ball. There could be just one gallstone in the gallbladder, or there could be many. What are the causes? Biliary colic is usually caused by gallstones. Less often, a tumor could block the flow of bile from the gallbladder and trigger biliary colic. What increases the risk? This  condition is more likely to develop in:  Women.  People of Hispanic descent.  People with a  family history of gallstones.  People who are obese.  People who suddenly or quickly lose weight.  People who eat a high-calorie, low-fiber diet that is rich in refined carbs (carbohydrates), such as white bread and white rice.  People who have an intestinal disease that affects nutrient absorption, such as Crohn disease.  People who have a metabolic condition, such as metabolic syndrome or diabetes. What are the signs or symptoms? Severe pain in the upper right side of the belly is the main symptom of biliary colic. You may feel this pain below the chest but above the hip. This pain often occurs at night or after eating a very fatty meal. This pain may get worse for up to an hour and last as long as 12 hours. In most cases, the pain fades (subsides) within a couple hours. Other symptoms of this condition include:  Nausea and vomiting.  Pain under the right shoulder. How is this diagnosed? This condition is diagnosed based on your medical history, your symptoms, and a physical exam. You may have tests, including:  Blood tests to rule out infection or inflammation of the bile ducts, gallbladder, pancreas, or liver.  Imaging studies such as: ? Ultrasound. ? CT scan. ? MRI. In some cases, you may need to have an imaging study done using a small amount of radioactive material (nuclear medicine) to confirm the diagnosis. How is this treated? Treatment for this condition may include medicine to relieve your pain or nausea. If you have gallstones that are causing biliary colic, you may need surgery to remove the gallbladder (cholecystectomy). Gallstones can also be dissolved gradually with medicine. It may take months or years before the gallstones are completely gone. Follow these instructions at home:  Take over-the-counter and prescription medicines only as told by your health care provider.  Drink enough fluid to keep your urine clear or pale yellow.  Follow instructions from your  health care provider about eating or drinking restrictions. These may include avoiding: ? Fatty, greasy, and fried foods. ? Any foods that make the pain worse. ? Overeating. ? Having a large meal after not eating for a while.  Keep all follow-up visits as told by your health care provider. This is important. How is this prevented? Steps to prevent this condition include:  Maintaining a healthy body weight.  Getting regular exercise.  Eating a healthy, high-fiber, low-fat diet.  Limiting how much sugar and refined carbs you eat, such as sweets, white flour, and white rice. Contact a health care provider if:  Your pain lasts more than 5 hours.  You vomit.  You have a fever and chills.  Your pain gets worse. Get help right away if:  Your skin or the whites of your eyes look yellow (jaundice).  Your have tea-colored urine and light-colored stools.  You are dizzy or you faint. Summary  Biliary colic is severe pain caused by a problem with a small organ in the upper right part of your belly (gallbladder).  Treatments for this condition include medicines that relieves your pain or nausea and medicines that slowly dissolves the gallstones.  If gallstones cause your biliary colic, the treatment is surgery to remove the gallbladder (cholecystectomy). This information is not intended to replace advice given to you by your health care provider. Make sure you discuss any questions you have with your health care provider. Document Released: 01/14/2006  Document Revised: 07/26/2017 Document Reviewed: 02/27/2016 Elsevier Patient Education  [place] Medications in Pregnancy    Acne: Benzoyl Peroxide Salicylic Acid  Backache/Headache: Tylenol: 2 regular strength every 4 hours OR              2 Extra strength every 6 hours  Colds/Coughs/Allergies: Benadryl (alcohol free) 25 mg every 6 hours as needed Breath right strips Claritin Cepacol throat  lozenges Chloraseptic throat spray Cold-Eeze- up to three times per day Cough drops, alcohol free Flonase (by prescription only) Guaifenesin Mucinex Robitussin DM (plain only, alcohol free) Saline nasal spray/drops Sudafed (pseudoephedrine) & Actifed ** use only after [redacted] weeks gestation and if you do not have high blood pressure Tylenol Vicks Vaporub Zinc lozenges Zyrtec   Constipation: Colace Ducolax suppositories Fleet enema Glycerin suppositories Metamucil Milk of magnesia Miralax Senokot Smooth move tea  Diarrhea: Kaopectate Imodium A-D  *NO pepto Bismol  Hemorrhoids: Anusol Anusol HC Preparation H Tucks  Indigestion: Tums Maalox Mylanta Zantac  Pepcid  Insomnia: Benadryl (alcohol free) 25mg  every 6 hours as needed Tylenol PM Unisom, no Gelcaps  Leg Cramps: Tums MagGel  Nausea/Vomiting:  Bonine Dramamine Emetrol Ginger extract Sea bands Meclizine  Nausea medication to take during pregnancy:  Unisom (doxylamine succinate 25 mg tablets) Take one tablet daily at bedtime. If symptoms are not adequately controlled, the dose can be increased to a maximum recommended dose of two tablets daily (1/2 tablet in the morning, 1/2 tablet mid-afternoon and one at bedtime). Vitamin B6 100mg  tablets. Take one tablet twice a day (up to 200 mg per day).  Skin Rashes: Aveeno products Benadryl cream or 25mg  every 6 hours as needed Calamine Lotion 1% cortisone cream  Yeast infection: Gyne-lotrimin 7 Monistat 7   **If taking multiple medications, please check labels to avoid duplicating the same active ingredients **take medication as directed on the label ** Do not exceed 4000 mg of tylenol in 24 hours **Do not take medications that contain aspirin or ibuprofen

## 2019-02-24 NOTE — MAU Note (Signed)
Pt reports since last night she has had a sharp pain in he mid to upper right quadrant that now radiates towards her back. Pain is sharp and continuous. Good fetal movement felt.

## 2019-02-24 NOTE — MAU Provider Note (Signed)
History     CSN: [id number]  Arrival date and time: 02/24/19 1251   First Provider Initiated Contact with Patient 02/24/19 1338      Chief Complaint  Patient presents with   Abdominal Pain   [name] is a 23 y.o. G1P0 at [redacted]w[redacted]d who presents to MAU for RUQ pain radiating to the back and pelvic pain. Pt denies experiencing this kind of pain before. Pt reports eating a burger and fries for dinner last night. Last intercourse 4-5days ago. Pt denies any trauma to torso.  Onset: last night around 2030 Location: RUQ, radiating to right flank, along with pelvic pain on left side in groin area Duration: <24hrs Character: RUQ: sharp/constant, back: sharp/constant, pelvic: pulsing, intermittent Aggravating/Associated: none/none Relieving: none Treatment: rotating sides - did not help, has not taken any medication for pain Severity: 10/10  Pt denies VB, LOF, ctx, decreased FM, vaginal discharge/odor/itching. Pt denies N/V, constipation, diarrhea, or urinary problems. Pt denies fever, chills, fatigue, sweating or changes in appetite. Pt denies SOB or chest pain. Pt denies dizziness, HA, light-headedness, weakness.  Problems this pregnancy include: lupus, GERD, anemia. Allergies? NKDA Current medications/supplements? Prednisone, plaquenil, prilosec, aspirin, PNVs, Tylenol PRN, albuterol PRN Prenatal care provider? [facility], next appt 02/26/2019   OB History    Gravida  1   Para      Term      Preterm      AB      Living        SAB      TAB      Ectopic      Multiple      Live Births              Past Medical History:  Diagnosis Date   Anxiety    Asthma    Chest discomfort feb 2017   Pt states due to shortness of breath   Cough present for greater than 3 weeks    Lupus (systemic lupus erythematosus) (HCC)    dx 2010   Pneumonia    2015   Shortness of breath dyspnea feb 2017   changed inhaler with relief    Past Surgical History:    Procedure Laterality Date   BRONCHOSCOPY  11/2013   WISDOM TOOTH EXTRACTION  2014    Family History  Problem Relation Age of Onset   Diabetes Paternal Grandmother     Social History   Tobacco Use   Smoking status: Former Smoker    Types: Cigarettes   Smokeless tobacco: Never Used  Substance Use Topics   Alcohol use: Not Currently   Drug use: Not Currently    Types: Marijuana    Allergies: No Known Allergies  Medications Prior to Admission  Medication Sig Dispense Refill Last Dose   aspirin 81 MG chewable tablet Chew by mouth daily.   02/24/2019 at Unknown time   ferrous gluconate (FERGON) 324 MG tablet Take 1 tablet (324 mg total) by mouth daily with breakfast. 30 tablet 3 02/24/2019 at Unknown time   hydroxychloroquine (PLAQUENIL) 200 MG tablet Take 1 tablet (200 mg total) by mouth daily. (Patient taking differently: Take 300 mg by mouth daily. ) 60 tablet 1 02/24/2019 at Unknown time   hydroxychloroquine (PLAQUENIL) 200 MG tablet hydroxychloroquine 200 mg tablet  2 TABLET WITH FOOD OR MILK ONCE A DAY   02/24/2019 at Unknown time   Omeprazole (PRILOSEC PO) Take by mouth.   02/24/2019 at Unknown time   predniSONE (DELTASONE) 5 MG  tablet Take 5 mg by mouth daily.    02/24/2019 at Unknown time   Prenat-FePoly-Metf-FA-DHA-DSS (VITAFOL FE+) 90-1-200 & 50 MG CPPK Take 1 tablet by mouth daily. 60 each 5 02/24/2019 at Unknown time   Prenatal Vit-Fe Fumarate-FA (MULTIVITAMIN-PRENATAL) 27-0.8 MG TABS tablet Take 1 tablet by mouth daily at 12 noon.   02/24/2019 at Unknown time   acetaminophen (TYLENOL) 500 MG tablet Take 1,000 mg by mouth every 6 (six) hours as needed for moderate pain or headache.      albuterol (VENTOLIN HFA) 108 (90 Base) MCG/ACT inhaler Ventolin HFA 90 mcg/actuation aerosol inhaler  INHALE 1 TO 2 PUFFS INTO THE LUNGS EVERY 6 HOURS AS NEEDED FOR WHEEZING OR SHORTNESS OF BREATH      albuterol (VENTOLIN HFA) 108 (90 Base) MCG/ACT inhaler INHALE 1-2 PUFFS INTO  THE LUNGS EVERY 6 (SIX) HOURS AS NEEDED FOR WHEEZING OR SHORTNESS OF BREATH. 6.7 Inhaler 0 More than a month at Unknown time   benzonatate (TESSALON PERLES) 100 MG capsule Take 2 capsules (200 mg total) by mouth every 6 (six) hours as needed for cough. (Patient not taking: Reported on 02/24/2019) 30 capsule 0    Budesonide 90 MCG/ACT inhaler Inhale 1 puff into the lungs 2 (two) times daily. (Patient not taking: Reported on 02/24/2019) 1 Inhaler 1 More than a month at Unknown time   cyclobenzaprine (FLEXERIL) 10 MG tablet cyclobenzaprine 10 mg tablet  1/2-1 TABLET AS NEEDED THREE TIMES A DAY ORALLY 30 DAYS   More than a month at Unknown time   metroNIDAZOLE (FLAGYL) 500 MG tablet Take 1 tablet (500 mg total) by mouth 2 (two) times daily. (Patient not taking: Reported on 02/24/2019) 14 tablet 0    polyvinyl alcohol (LIQUIFILM TEARS) 1.4 % ophthalmic solution Place 1 drop into both eyes 3 (three) times daily as needed for dry eyes.   Unknown at Unknown time   promethazine (PHENERGAN) 6.25 MG/5ML syrup Take 5 mLs (6.25 mg total) by mouth 4 (four) times daily as needed for nausea or vomiting. (Patient not taking: Reported on 02/24/2019) 120 mL 1    vitamin B-12 (CYANOCOBALAMIN) 1000 MCG tablet Take 1 tablet (1,000 mcg total) by mouth daily. (Patient not taking: Reported on 04/24/2017) 90 tablet 2     Review of Systems  Constitutional: Negative for chills, diaphoresis, fatigue and fever.  Respiratory: Negative for shortness of breath.   Cardiovascular: Negative for chest pain.  Gastrointestinal: Positive for abdominal pain (RUQ). Negative for constipation, diarrhea, nausea and vomiting.  Genitourinary: Positive for pelvic pain (left-sided). Negative for dysuria, flank pain, frequency, urgency, vaginal bleeding and vaginal discharge.  Musculoskeletal: Positive for back pain (right flank).  Neurological: Negative for dizziness, weakness, light-headedness and headaches.   Physical Exam   Blood  pressure 121/78, pulse 95, temperature 98.2 F (36.8 C), resp. rate 18, height 5\' 2"  (1.575 m), weight 63 kg, last menstrual period 07/13/2018.  Patient Vitals for the past 24 hrs:  BP Temp Pulse Resp Height Weight  02/24/19 1309 121/78 98.2 F (36.8 C) 95 18 5\' 2"  (1.575 m) 63 kg   Physical Exam  Constitutional: She is oriented to person, place, and time. She appears well-developed and well-nourished. She appears distressed (pt having difficulty moving in bed d/t pain).  HENT:  Head: Normocephalic and atraumatic.  Respiratory: Effort normal.  GI: Soft. She exhibits no distension and no mass. There is no hepatomegaly. There is abdominal tenderness in the right upper quadrant. There is CVA tenderness (right-sided). There is no  rigidity, no rebound, no guarding, no tenderness at McBurney's point and negative Murphy's sign.    Genitourinary: There is no rash, tenderness or lesion on the right labia. There is no rash, tenderness or lesion on the left labia.    Genitourinary Comments: CE (initial): long/closed/posterior   Neurological: She is alert and oriented to person, place, and time.  Skin: Skin is warm and dry. She is not diaphoretic.  Psychiatric: She has a normal mood and affect. Her behavior is normal. Judgment and thought content normal.   Results for orders placed or performed during the hospital encounter of 02/24/19 (from the past 24 hour(s))  Urinalysis, Routine w reflex microscopic     Status: None   Collection Time: 02/24/19  1:12 PM  Result Value Ref Range   Color, Urine YELLOW YELLOW   APPearance CLEAR CLEAR   Specific Gravity, Urine 1.009 1.005 - 1.030   pH 7.0 5.0 - 8.0   Glucose, UA NEGATIVE NEGATIVE mg/dL   Hgb urine dipstick NEGATIVE NEGATIVE   Bilirubin Urine NEGATIVE NEGATIVE   Ketones, ur NEGATIVE NEGATIVE mg/dL   Protein, ur NEGATIVE NEGATIVE mg/dL   Nitrite NEGATIVE NEGATIVE   Leukocytes,Ua NEGATIVE NEGATIVE  CBC with Differential/Platelet     Status:  Abnormal   Collection Time: 02/24/19  1:48 PM  Result Value Ref Range   WBC 7.8 4.0 - 10.5 K/uL   RBC 2.81 (L) 3.87 - 5.11 MIL/uL   Hemoglobin 8.3 (L) 12.0 - 15.0 g/dL   HCT 16.1 (L) 09.6 - 04.5 %   MCV 89.0 80.0 - 100.0 fL   MCH 29.5 26.0 - 34.0 pg   MCHC 33.2 30.0 - 36.0 g/dL   RDW 40.9 (H) 81.1 - 91.4 %   Platelets 319 150 - 400 K/uL   nRBC 0.0 0.0 - 0.2 %   Neutrophils Relative % 86 %   Neutro Abs 6.7 1.7 - 7.7 K/uL   Lymphocytes Relative 6 %   Lymphs Abs 0.5 (L) 0.7 - 4.0 K/uL   Monocytes Relative 3 %   Monocytes Absolute 0.3 0.1 - 1.0 K/uL   Eosinophils Relative 1 %   Eosinophils Absolute 0.1 0.0 - 0.5 K/uL   Basophils Relative 0 %   Basophils Absolute 0.0 0.0 - 0.1 K/uL   Immature Granulocytes 4 %   Abs Immature Granulocytes 0.27 (H) 0.00 - 0.07 K/uL  Comprehensive metabolic panel     Status: Abnormal   Collection Time: 02/24/19  1:48 PM  Result Value Ref Range   Sodium 137 135 - 145 mmol/L   Potassium 2.9 (L) 3.5 - 5.1 mmol/L   Chloride 109 98 - 111 mmol/L   CO2 22 22 - 32 mmol/L   Glucose, Bld 104 (H) 70 - 99 mg/dL   BUN <5 (L) 6 - 20 mg/dL   Creatinine, Ser 7.82 0.44 - 1.00 mg/dL   Calcium 7.6 (L) 8.9 - 10.3 mg/dL   Total Protein 6.0 (L) 6.5 - 8.1 g/dL   Albumin 1.9 (L) 3.5 - 5.0 g/dL   AST 41 15 - 41 U/L   ALT 18 0 - 44 U/L   Alkaline Phosphatase 78 38 - 126 U/L   Total Bilirubin 0.4 0.3 - 1.2 mg/dL   GFR calc non Af Amer >60 >60 mL/min   GFR calc Af Amer >60 >60 mL/min   Anion gap 6 5 - 15  Amylase     Status: None   Collection Time: 02/24/19  1:48 PM  Result Value Ref  Range   Amylase 84 28 - 100 U/L  Lipase, blood     Status: None   Collection Time: 02/24/19  1:48 PM  Result Value Ref Range   Lipase 29 11 - 51 U/L   US Abdomen Complete  Result Date: 02/24/2019 CLINICAL DATA:  Continuous right upper quadrant abdominal pain. Patient is [redacted] weeks pregnant. EXAM: ABDOMEN ULTRASOUND COMPLETE COMPARISON:  Right upper quadrant abdominal ultrasound  03/06/2017. FINDINGS: Gallbladder: Mild gallbladder sludge and questionable small polyps. No discrete stones. No gallbladder wall thickening or sonographic Murphy's sign. Common bile duct: Diameter: 1 mm Liver: No focal lesion identified. Within normal limits in parenchymal echogenicity. Portal vein is patent on color Doppler imaging with normal direction of blood flow towards the liver. IVC: No abnormality visualized. Pancreas: Visualized portion unremarkable. Spleen: Size and appearance within normal limits. Right Kidney: Length: 13.5 cm. Mild right-sided hydronephrosis. No focal cortical lesion. Left Kidney: Length: 10.5 cm. Echogenicity within normal limits. No mass or hydronephrosis visualized. Abdominal aorta: No aneurysm visualized. Other findings: None. IMPRESSION: 1. Mild right-sided hydronephrosis, likely physiologic during 3rd trimester of pregnancy. Correlate clinically. 2. Gallbladder sludge with questionable small polyps. No gallstones or biliary dilatation. Electronically Signed   By: Carey Bullocks M.D.   On: 02/24/2019 14:44   [place] Mfm Fetal Bpp Wo Non Stress  Result Date: 01/29/2019 ----------------------------------------------------------------------  OBSTETRICS REPORT                       (Signed Final 01/29/2019 01:53 pm) ---------------------------------------------------------------------- Patient Info  ID #:       [id number]                          D.O.B.:  1996/06/11 (22 yrs)  Name:       INIOLUWA BOULAY HAITH                Visit Date: 01/29/2019 01:39 pm ---------------------------------------------------------------------- Performed By  Performed By:     Marcellina Millin          Ref. Address:     [name]                                                             [place]                                                             [place] [address]  Attending:        Noralee Space MD        Location[facility]  Referred By:      Luna Glasgow CNM ---------------------------------------------------------------------- Orders   #  Description                          Code         Ordered By   1  [place] MFM FETAL BPP WO NON              56812.75     KATHRYN KOOISTRA      STRESS  ----------------------------------------------------------------------   #  Order #                    Accession #                 Episode #   1  [id number]                  [id number]                  [id number]  ---------------------------------------------------------------------- Indications   Abnormal finding on antenatal screening        O28.9   (6/8 BPP yesterday)   [redacted] weeks gestation of pregnancy                Z3A.28   Systemic lupus complicating pregnancy,         O26.893, M32.9   third trimester  ---------------------------------------------------------------------- Vital Signs                                                 Height:        5'2" ---------------------------------------------------------------------- Fetal Evaluation  Num Of Fetuses:         1  Fetal Heart Rate(bpm):  150  Cardiac Activity:       Observed  Presentation:           Cephalic  Placenta:               Anterior  Amniotic Fluid  AFI FV:      Within normal limits  AFI Sum(cm)     %Tile       Largest Pocket(cm)  16.4            60          6.8  RUQ(cm)       RLQ(cm)       LUQ(cm)        LLQ(cm)  6.8           4.5           3              2.1  Comment:    8/8 BPP In 7 minutes. ---------------------------------------------------------------------- Biophysical Evaluation  Amniotic F.V:   Within normal limits       F. Tone:        Observed  F. Movement:    Observed                   Score:          8/8  F. Breathing:   Observed ---------------------------------------------------------------------- OB History  Gravidity:    1 ---------------------------------------------------------------------- Gestational Age  LMP:  28w 4d        Date:  07/13/18                 EDD:   04/19/19  Best:          Eden Emms 4d     Det. By:  LMP  (07/13/18)          EDD:   04/19/19 ---------------------------------------------------------------------- Anatomy  Stomach:               Appears normal, left   Bladder:                Appears normal                         sided ---------------------------------------------------------------------- Impression  Patient returned for BPP. She had nonreassuring fetal testing  yesterday.  Amniotic fluid is normal and good fetal activity is seen.  Antenatal testing is reassuring. BPP 8/8.  Her pregnancy is complicated by SLE and she takes  plaquenil. BP today 112/60 mm Hg. She has no flares or  complications of lupus. ---------------------------------------------------------------------- Recommendations  An appointment was made for her to return in 4 weeks for  fetal growth assessment. ----------------------------------------------------------------------                  Noralee Space, MD Electronically Signed Final Report   01/29/2019 01:53 pm ----------------------------------------------------------------------  [place] Mfm Fetal Bpp Wo Non Stress  Result Date: 01/28/2019 ----------------------------------------------------------------------  OBSTETRICS REPORT                       (Signed Final 01/28/2019 03:07 pm) ---------------------------------------------------------------------- Patient Info  ID #:       [id number]                          D.O.B.:  [birth year] (22 yrs)  Name:       KELLYANN ORDWAY HAITH                Visit Date: 01/28/2019 02:22 pm ---------------------------------------------------------------------- Performed By  Performed By:     Marcellina Millin          Ref. Address:     [address]                                                             [place]                                                             [place] [address]  Attending:        Lin Landsman       Secondary Phy.:   MAU Nursing-                    MD  MAU/Triage  Referred By:      Nat Christen          [facility]                    [facility] CNM                             Fetal Care ---------------------------------------------------------------------- Orders   #  Description                          Code         Ordered By   1  [place] MFM FETAL BPP WO NON              76819.01     KATHRYN KOOISTRA      STRESS  ----------------------------------------------------------------------   #  Order #                    Accession #                 Episode #   1  [id number]                  [id number]                  [id number]  ---------------------------------------------------------------------- Indications   Non-reactive NST, FHR decelerations            O28.9   [redacted] weeks gestation of pregnancy                Z3A.28  ---------------------------------------------------------------------- Vital Signs                                                 Height:        5'2" ---------------------------------------------------------------------- Fetal Evaluation  Num Of Fetuses:         1  Fetal Heart Rate(bpm):  153  Cardiac Activity:       Observed  Presentation:           Cephalic  Placenta:               Anterior  Amniotic Fluid  AFI FV:      Within normal limits  AFI Sum(cm)     %Tile       Largest Pocket(cm)  18.6            72          6.6  RUQ(cm)       RLQ(cm)       LUQ(cm)        LLQ(cm)  5.6           2.1           6.6            4.3 ---------------------------------------------------------------------- Biophysical Evaluation  Amniotic F.V:   Within normal limits       F. Tone:        Observed  F. Movement:    Observed                   Score:          6/8  F. Breathing:   Not  Observed ---------------------------------------------------------------------- OB History  Gravidity:    1  ---------------------------------------------------------------------- Gestational Age  LMP:           28w 3d        Date:  07/13/18                 EDD:   04/19/19  Best:          Eden Emms28w 3d     Det. By:  LMP  (07/13/18)          EDD:   04/19/19 ---------------------------------------------------------------------- Anatomy  Stomach:               Appears normal, left   Bladder:                Appears normal                         sided ---------------------------------------------------------------------- Impression  NR-NST  Biophysical profile 6/10 -2 for breathing ---------------------------------------------------------------------- Recommendations  Consider repeat testing in 24 hours. ----------------------------------------------------------------------               Lin Landsmanorenthian Booker, MD Electronically Signed Final Report   01/28/2019 03:07 pm ----------------------------------------------------------------------  MAU Course  Procedures  MDM -RUQ/+CVA tenderness right side/left-sided pelvic/groin pain -UA: WNL -CBC: H/H 8.3/25 (hgb was 9.1 on 01/29/2019), RBCs 2.81, WBCs 7.8 -CMP: K 2.9, will send home with RX for Kdur, AST 41, ALT 18 -Amylase: 84 -Lipase: 29 -CE (initial): long/closed/posterior -Abdominal US: mild GB sludge and questionable small polyps, mild right hydronephrosis -EFM: non-reactive       -baseline: wandering, between 135-155       -variability: moderate       -accels: none       -decels: few variable       -TOCO: irritability, 2 ctx -BPP: 8/8 -called and spoke with Dr. Jolayne Pantheronstant @1609 , recommend add-on urine culture, advise pt to decrease fat intake, Tylenol PRN, can be discharged to home with strict precautions to return with worsening signs and symptoms -upon reviewing results with pt, pt reports her pain is now at 6/10 after resting in MAU, same locations, no change in characteristics -discussed impt of taking iron with pt, pt reports she did not get RX for iron,  sending new RX for pt -pt declines repeat CE, despite discussion on importance of determining if sx are related to PTL -pt discharged to home in stable condition  Orders Placed This Encounter  Procedures   Culture, OB Urine    Standing Status:   Standing    Number of Occurrences:   1   US Abdomen Complete    Standing Status:   Standing    Number of Occurrences:   1    Order Specific Question:   Symptom/Reason for Exam    Answer:   Continuous RUQ abdominal pain [713430]   US MFM FETAL BPP WO NON STRESS    Standing Status:   Standing    Number of Occurrences:   1    Order Specific Question:   Symptom/Reason for Exam    Answer:   Non-reactive NST (non-stress test) [409811][366963]   Urinalysis, Routine w reflex microscopic    Standing Status:   Standing    Number of Occurrences:   1   CBC with Differential/Platelet    Standing Status:   Standing    Number of Occurrences:   1   Comprehensive metabolic panel    Standing Status:   Standing  Number of Occurrences:   1   Amylase    Standing Status:   Standing    Number of Occurrences:   1   Lipase, blood    Standing Status:   Standing    Number of Occurrences:   1   Discharge patient    Order Specific Question:   Discharge disposition    Answer:   01-Home or Self Care [1]    Order Specific Question:   Discharge patient date    Answer:   02/24/2019    Assessment and Plan   1. Continuous RUQ abdominal pain   2. Iron deficiency anemia during pregnancy, antepartum   3. Non-reactive NST (non-stress test)   4. Costovertebral angle tenderness   5. Pelvic pain in pregnancy, antepartum, third trimester   6. Hypokalemia    Allergies as of 02/24/2019   No Known Allergies     Medication List    STOP taking these medications   ferrous gluconate 324 MG tablet Commonly known as: FERGON     TAKE these medications   acetaminophen 500 MG tablet Commonly known as: TYLENOL Take 1,000 mg by mouth every 6 (six) hours as needed for  moderate pain or headache.   aspirin 81 MG chewable tablet Chew by mouth daily.   benzonatate 100 MG capsule Commonly known as: Tessalon Perles Take 2 capsules (200 mg total) by mouth every 6 (six) hours as needed for cough.   Budesonide 90 MCG/ACT inhaler Inhale 1 puff into the lungs 2 (two) times daily.   cyclobenzaprine 10 MG tablet Commonly known as: FLEXERIL cyclobenzaprine 10 mg tablet  1/2-1 TABLET AS NEEDED THREE TIMES A DAY ORALLY 30 DAYS   ferrous sulfate 325 (65 FE) MG tablet Take 1 tablet (325 mg total) by mouth 2 (two) times daily with a meal.   hydroxychloroquine 200 MG tablet Commonly known as: PLAQUENIL hydroxychloroquine 200 mg tablet  2 TABLET WITH FOOD OR MILK ONCE A DAY What changed: Another medication with the same name was changed. Make sure you understand how and when to take each.   hydroxychloroquine 200 MG tablet Commonly known as: PLAQUENIL Take 1 tablet (200 mg total) by mouth daily. What changed: how much to take   metroNIDAZOLE 500 MG tablet Commonly known as: FLAGYL Take 1 tablet (500 mg total) by mouth 2 (two) times daily.   multivitamin-prenatal 27-0.8 MG Tabs tablet Take 1 tablet by mouth daily at 12 noon.   polyvinyl alcohol 1.4 % ophthalmic solution Commonly known as: LIQUIFILM TEARS Place 1 drop into both eyes 3 (three) times daily as needed for dry eyes.   potassium chloride 10 MEQ tablet Commonly known as: K-DUR Take 1 tablet (10 mEq total) by mouth 2 (two) times daily for 5 days.   predniSONE 5 MG tablet Commonly known as: DELTASONE Take 5 mg by mouth daily.   PRILOSEC PO Take by mouth.   promethazine 6.25 MG/5ML syrup Commonly known as: PHENERGAN Take 5 mLs (6.25 mg total) by mouth 4 (four) times daily as needed for nausea or vomiting.   Ventolin HFA 108 (90 Base) MCG/ACT inhaler Generic drug: albuterol Ventolin HFA 90 mcg/actuation aerosol inhaler  INHALE 1 TO 2 PUFFS INTO THE LUNGS EVERY 6 HOURS AS NEEDED FOR  WHEEZING OR SHORTNESS OF BREATH   albuterol 108 (90 Base) MCG/ACT inhaler Commonly known as: VENTOLIN HFA INHALE 1-2 PUFFS INTO THE LUNGS EVERY 6 (SIX) HOURS AS NEEDED FOR WHEEZING OR SHORTNESS OF BREATH.   Vitafol FE+ 90-1-200 &  50 MG Cppk Take 1 tablet by mouth daily.   vitamin B-12 1000 MCG tablet Commonly known as: CYANOCOBALAMIN Take 1 tablet (1,000 mcg total) by mouth daily.      -RX for K-Dur -RX for Fe -will call with culture results, if positive -pt encouraged to start taking iron, discussed appropriate ways to take, possible side effects and how to relieve them with OTC medication -list of safe meds to take in pregnancy given -discussed FKCs, FM monitoring and pt encouraged to return to MAU ASAP with decrease in FM -advised low-fat diet, discussed foods to avoid -strict PTL/worsening sx/new onset sx/return MAU precautions discussed -message sent to Steward DroneVeronica Rogers, CNM to f/u with pt regarding symptoms at virtual visit on 02/26/2019 -pt discharged to home in stable condition  Joni Reiningicole E Lamiya Naas 02/24/2019, 4:49 PM

## 2019-02-26 ENCOUNTER — Encounter: Payer: Self-pay | Admitting: Certified Nurse Midwife

## 2019-02-26 ENCOUNTER — Ambulatory Visit (INDEPENDENT_AMBULATORY_CARE_PROVIDER_SITE_OTHER): Payer: Medicaid Other | Admitting: Certified Nurse Midwife

## 2019-02-26 VITALS — BP 111/82 | HR 98

## 2019-02-26 DIAGNOSIS — O99013 Anemia complicating pregnancy, third trimester: Secondary | ICD-10-CM

## 2019-02-26 DIAGNOSIS — D509 Iron deficiency anemia, unspecified: Secondary | ICD-10-CM

## 2019-02-26 DIAGNOSIS — Z3400 Encounter for supervision of normal first pregnancy, unspecified trimester: Secondary | ICD-10-CM

## 2019-02-26 DIAGNOSIS — O9989 Other specified diseases and conditions complicating pregnancy, childbirth and the puerperium: Secondary | ICD-10-CM

## 2019-02-26 DIAGNOSIS — M329 Systemic lupus erythematosus, unspecified: Secondary | ICD-10-CM

## 2019-02-26 DIAGNOSIS — Z3A32 32 weeks gestation of pregnancy: Secondary | ICD-10-CM

## 2019-02-26 LAB — CULTURE, OB URINE

## 2019-02-26 NOTE — Progress Notes (Signed)
S/w pt to prepare for webex. Pt reports fetal movement with round ligament pain.

## 2019-02-26 NOTE — Progress Notes (Signed)
TELEHEALTH OBSTETRICS PRENATAL VIRTUAL VIDEO VISIT ENCOUNTER NOTE  Provider [facility]: [facility]   I connected with Kelly [name] on 02/26/19 at  1:45 PM EDT by WebEx Video Encounter at home and verified that I am speaking with the correct person using two identifiers.   I discussed the limitations, risks, security and privacy concerns of performing an evaluation and management service by telephone and the availability of in person appointments. I also discussed with the patient that there may be a patient responsible charge related to this service. The patient expressed understanding and agreed to proceed. Subjective:  Kelly [name] is a 23 y.o. G1P0 at 6080w4d being seen today for ongoing prenatal care.  She is currently monitored for the following issues for this low-risk pregnancy and has Supervision of normal first pregnancy, antepartum; Systemic lupus erythematosus (SLE) affecting pregnancy, antepartum (HCC); Iron deficiency anemia during pregnancy, antepartum; Late Entry to [employer]- April 2020- Social Distancing ; Hypokalemia; and Hydronephrosis of right kidney on their problem list.  Patient reports round ligament pain.  Contractions: Not present. Vag. Bleeding: None.  Movement: Present. Denies any leaking of fluid.   The following portions of the patient's history were reviewed and updated as appropriate: allergies, current medications, past family history, past medical history, past social history, past surgical history and problem list.   Objective:   Vitals:   02/26/19 1347  BP: 111/82  Pulse: 98    Fetal Status:     Movement: Present     General:  Alert, oriented and cooperative. Patient is in no acute distress.  Respiratory: Normal respiratory effort, no problems with respiration noted  Mental Status: Normal mood and affect. Normal behavior. Normal judgment and thought content.  Rest of physical exam deferred due to type of  encounter  Imaging: Kore[place] Abdomen Complete  Result Date: 02/24/2019 CLINICAL DATA:  Continuous right upper quadrant abdominal pain. Patient is [redacted] weeks pregnant. EXAM: ABDOMEN ULTRASOUND COMPLETE COMPARISON:  Right upper quadrant abdominal ultrasound 03/06/2017. FINDINGS: Gallbladder: Mild gallbladder sludge and questionable small polyps. No discrete stones. No gallbladder wall thickening or sonographic Murphy's sign. Common bile duct: Diameter: 1 mm Liver: No focal lesion identified. Within normal limits in parenchymal echogenicity. Portal vein is patent on color Doppler imaging with normal direction of blood flow towards the liver. IVC: No abnormality visualized. Pancreas: Visualized portion unremarkable. Spleen: Size and appearance within normal limits. Right Kidney: Length: 13.5 cm. Mild right-sided hydronephrosis. No focal cortical lesion. Left Kidney: Length: 10.5 cm. Echogenicity within normal limits. No mass or hydronephrosis visualized. Abdominal aorta: No aneurysm visualized. Other findings: None. IMPRESSION: 1. Mild right-sided hydronephrosis, likely physiologic during 3rd trimester of pregnancy. Correlate clinically. 2. Gallbladder sludge with questionable small polyps. No gallstones or biliary dilatation. Electronically Signed   By: Carey BullocksWilliam  Veazey M.D.   On: 02/24/2019 14:44   [place]ore[place] Mfm Fetal Bpp Wo Non Stress  Result Date: 02/25/2019 ----------------------------------------------------------------------  OBSTETRICS REPORT                       (Signed Final 02/25/2019 09:55 am) ---------------------------------------------------------------------- Patient Info  ID #:       [id number]                          D.O.B.:  [birth year] (22 yrs)  Name:       Kelly [name]  Visit Date: 02/24/2019 03:26 pm ---------------------------------------------------------------------- Performed By  Performed By:     Berlinda Last          Secondary Phy.:   MAU Nursing-                    RDMS                                                              MAU/Triage  Attending:        Sander Nephew      [facility]                                       [place]  Referred By:      Clarisa Fling ---------------------------------------------------------------------- Orders   #  Description                          Code         Ordered By   1  [place] MFM FETAL BPP WO NON              11914.78     NICOLE NUGENT      STRESS  ----------------------------------------------------------------------   #  Order #                    Accession #                 Episode #   1  [id number]                  [id number]                  [id number]  ---------------------------------------------------------------------- Indications   Non-reactive NST                               O28.9   [redacted] weeks gestation of pregnancy                Z3A.32   Systemic lupus complicating pregnancy,         O26.893, M32.9   third trimester  ---------------------------------------------------------------------- Vital Signs                                                 Height:        5'2" ---------------------------------------------------------------------- Fetal Evaluation  Num Of Fetuses:         1  Fetal Heart Rate(bpm):  137  Cardiac Activity:       Observed  Presentation:           Cephalic  Amniotic Fluid  AFI FV:      Within normal limits  AFI Sum(cm)     %Tile       Largest Pocket(cm)  12.65  37          4.51  RUQ(cm)       RLQ(cm)       LUQ(cm)        LLQ(cm)  2.62          4.51          2.52           3 ---------------------------------------------------------------------- Biophysical Evaluation  Amniotic F.V:   Within normal limits       F. Tone:        Observed  F. Movement:    Observed                   Score:          8/8  F. Breathing:   Observed ---------------------------------------------------------------------- OB History  Gravidity:    1  ---------------------------------------------------------------------- Gestational Age  LMP:           32w 2d        Date:  07/13/18                 EDD:   04/19/19  Best:          Armida Sans 2d     Det. By:  LMP  (07/13/18)          EDD:   04/19/19 ---------------------------------------------------------------------- Anatomy  Stomach:               Appears normal, left   Bladder:                Appears normal                         sided ---------------------------------------------------------------------- Impression  Biophysical profile 8/8 ---------------------------------------------------------------------- Recommendations  Management per inpatient team. ----------------------------------------------------------------------               Lin Landsman, MD Electronically Signed Final Report   02/25/2019 09:55 am ----------------------------------------------------------------------  [place] Mfm Fetal Bpp Wo Non Stress  Result Date: 01/29/2019 ----------------------------------------------------------------------  OBSTETRICS REPORT                       (Signed Final 01/29/2019 01:53 pm) ---------------------------------------------------------------------- Patient Info  ID #:       [id number]                          D.O.B.:  1996-04-01 (22 yrs)  Name:       Kelly [name]                Visit Date: 01/29/2019 01:39 pm ---------------------------------------------------------------------- Performed By  Performed By:     Marcellina Millin          Ref. Address:     [address]                    RDMS                                                             [place]  [place] [address]  Attending:        Noralee Space MD        [facility]  Referred By:      Jorje Guild CNM  ---------------------------------------------------------------------- Orders   #  Description                          Code         Ordered By   1  [place] MFM FETAL BPP WO NON              19147.82     KATHRYN KOOISTRA      STRESS  ----------------------------------------------------------------------   #  Order #                    Accession #                 Episode #   1  [id number]                  [id number]                  [id number]  ---------------------------------------------------------------------- Indications   Abnormal finding on antenatal screening        O28.9   (6/8 BPP yesterday)   [redacted] weeks gestation of pregnancy                Z3A.28   Systemic lupus complicating pregnancy,         O26.893, M32.9   third trimester  ---------------------------------------------------------------------- Vital Signs                                                 Height:        5'2" ---------------------------------------------------------------------- Fetal Evaluation  Num Of Fetuses:         1  Fetal Heart Rate(bpm):  150  Cardiac Activity:       Observed  Presentation:           Cephalic  Placenta:               Anterior  Amniotic Fluid  AFI FV:      Within normal limits  AFI Sum(cm)     %Tile       Largest Pocket(cm)  16.4            60          6.8  RUQ(cm)       RLQ(cm)       LUQ(cm)        LLQ(cm)  6.8           4.5           3              2.1  Comment:    8/8 BPP In 7 minutes. ---------------------------------------------------------------------- Biophysical Evaluation  Amniotic F.V:   Within normal limits       F. Tone:        Observed  F. Movement:    Observed                   Score:          8/8  F. Breathing:   Observed ---------------------------------------------------------------------- OB History  Gravidity:    1 ---------------------------------------------------------------------- Gestational Age  LMP:           28w 4d        Date:  07/13/18                 EDD:   04/19/19  Best:          Eden Emms 4d      Det. By:  LMP  (07/13/18)          EDD:   04/19/19 ---------------------------------------------------------------------- Anatomy  Stomach:               Appears normal, left   Bladder:                Appears normal                         sided ---------------------------------------------------------------------- Impression  Patient returned for BPP. She had nonreassuring fetal testing  yesterday.  Amniotic fluid is normal and good fetal activity is seen.  Antenatal testing is reassuring. BPP 8/8.  Her pregnancy is complicated by SLE and she takes  plaquenil. BP today 112/60 mm Hg. She has no flares or  complications of lupus. ---------------------------------------------------------------------- Recommendations  An appointment was made for her to return in 4 weeks for  fetal growth assessment. ----------------------------------------------------------------------                  Noralee Space, MD Electronically Signed Final Report   01/29/2019 01:53 pm ----------------------------------------------------------------------  [place] Mfm Fetal Bpp Wo Non Stress  Result Date: 01/28/2019 ----------------------------------------------------------------------  OBSTETRICS REPORT                       (Signed Final 01/28/2019 03:07 pm) ---------------------------------------------------------------------- Patient Info  ID #:       [id number]                          D.O.B.:  March 21, 1996 (22 yrs)  Name:       Kelly [name]                Visit Date: 01/28/2019 02:22 pm ---------------------------------------------------------------------- Performed By  Performed By:     Marcellina Millin          Ref. Address:     [address]                    RDMS                                                             [place]  [place] [address]  Attending:        Lin Landsman      Secondary Phy.:   MAU Nursing-                    MD                                                              MAU/Triage  Referred By:      Nat Christen          [facility]                    [facility]OO[facility]RA CNM                             [facility]etal Care ---------------------------------------------------------------------- Orders   #  Description                          Code         Ordered By   1  [place] MFM FETAL BPP WO NON              76819.01     KATHRYN KOOISTRA      STRESS  ----------------------------------------------------------------------   #  Order #                    Accession #                 Episode #   1  [id number]                  [id number]                  [id number]  ---------------------------------------------------------------------- Indications   Non-reactive NST, FHR decelerations            O28.9   [redacted] weeks gestation of pregnancy                Z3A.28  ---------------------------------------------------------------------- Vital Signs                                                 Height:        5'2" ---------------------------------------------------------------------- Fetal Evaluation  Num Of Fetuses:         1  Fetal Heart Rate(bpm):  153  Cardiac Activity:       Observed  Presentation:           Cephalic  Placenta:               Anterior  Amniotic Fluid  AFI FV:      Within normal limits  AFI Sum(cm)     %Tile       Largest Pocket(cm)  18.6            72          6.6  RUQ(cm)       RLQ(cm)       LUQ(cm)        LLQ(cm)  5.6           2.1           6.6            4.3 ---------------------------------------------------------------------- Biophysical Evaluation  Amniotic F.V:   Within normal limits       F. Tone:        Observed  F. Movement:    Observed                   Score:          6/8  F. Breathing:   Not Observed ---------------------------------------------------------------------- OB History  Gravidity:    1 ---------------------------------------------------------------------- Gestational Age  LMP:           28w 3d        Date:  07/13/18                  EDD:   04/19/19  Best:          Eden Emms 3d     Det. By:  LMP  (07/13/18)          EDD:   04/19/19 ---------------------------------------------------------------------- Anatomy  Stomach:               Appears normal, left   Bladder:                Appears normal                         sided ---------------------------------------------------------------------- Impression  NR-NST  Biophysical profile 6/10 -2 for breathing ---------------------------------------------------------------------- Recommendations  Consider repeat testing in 24 hours. ----------------------------------------------------------------------               Lin Landsman, MD Electronically Signed Final Report   01/28/2019 03:07 pm ----------------------------------------------------------------------  [place] Mfm Ob Follow Up  Result Date: 02/25/2019 ----------------------------------------------------------------------  OBSTETRICS REPORT                        (Signed Final 02/25/2019 07:22 am) ---------------------------------------------------------------------- Patient Info  ID #:       [id number]                          D.O.B.:  26-Feb-1996 (22 yrs)  Name:       Kelly [name]                Visit Date: 02/24/2019 11:11 am ---------------------------------------------------------------------- Performed By  Performed By:     Tomma Lightning             Ref. Address:      [address]                    RDMS,RVT                                                              [place]  [place] [address]  Attending:        Lin Landsman      Secondary Phy.:    MAU Nursing-                    MD                                                              MAU/Triage  Referred By:      Nat Christen          [facility]                    KOO[facility]RA CNM                              Fetal Care ----------------------------------------------------------------------  Orders   #  Description                          Code         Ordered By   1  [place] MFM OB FOLLOW UP                  76816.01     Lin Landsman  ----------------------------------------------------------------------   #  Order #                    Accession #                 Episode #   1  [id number]                  [id number]                  [id number]  ---------------------------------------------------------------------- Indications   Systemic lupus complicating pregnancy,         O26.893, M32.9   third trimester   [redacted] weeks gestation of pregnancy                Z3A.32   Encounter for other antenatal screening        Z36.2   follow-up  ---------------------------------------------------------------------- Vital Signs  Weight (lb): 140                               Height:        5'2"  BMI:         25.6 ---------------------------------------------------------------------- Fetal Evaluation  Num Of Fetuses:          1  Fetal Heart Rate(bpm):   142  Cardiac Activity:        Observed  Presentation:            Cephalic  Placenta:                Anterior  P. Cord Insertion:       Previously Visualized  Amniotic Fluid  AFI FV:      Within normal limits  AFI Sum(cm)     %Tile       Largest Pocket(cm)  13.08           40          5.26  RUQ(cm)       RLQ(cm)       LUQ(cm)        LLQ(cm)  2.87          0.92          5.26           4.03 ---------------------------------------------------------------------- Biometry  BPD:      84.9  mm     G. Age:  34w 1d         90  %    CI:        85.77   %    70 - 86                                                          FL/HC:       20.4  %    19.1 - 21.3  HC:      288.7  mm     G. Age:  31w 5d          7  %    HC/AC:       1.02       0.96 - 1.17  AC:      282.8  mm     G. Age:  32w 2d         50  %    FL/BPD:      69.5  %    71 - 87  FL:         59  mm     G. Age:  30w 5d          7  %    FL/AC:       20.9  %    20 - 24  HUM:         55  mm     G. Age:  32w 0d         47  %  Est. FW:    1873   gm     4 lb 2 oz     29  % ---------------------------------------------------------------------- OB History  Gravidity:    1 ---------------------------------------------------------------------- Gestational Age  LMP:           32w 2d        Date:  07/13/18                 EDD:   04/19/19  U/S Today:     32w 2d                                        EDD:   04/19/19  Best:          32w 2d     Det. By:  LMP  (07/13/18)          EDD:  04/19/19 ---------------------------------------------------------------------- Anatomy  Cranium:               Appears normal         Aortic Arch:            Previously seen  Cavum:                 Previously seen        Ductal Arch:            Previously seen  Ventricles:            Previously seen        Diaphragm:              Previously seen  Choroid Plexus:        Previously seen        Stomach:                Appears normal, left                                                                        sided  Cerebellum:            Previously seen        Abdomen:                Previously seen  Posterior Fossa:       Previously seen        Abdominal Wall:         Previously seen  Nuchal Fold:           Previously seen        Cord Vessels:           Previously seen  Face:                  Orbits and profile     Kidneys:                Appear normal                         previously seen  Lips:                  Previously seen        Bladder:                Appears normal  Thoracic:              Appears normal         Spine:                  Previously seen  Heart:                 Appears normal         Upper Extremities:      Previously seen                         (4CH, axis, and                         situs)  RVOT:  Previously seen        Lower Extremities:      Previously seen  LVOT:                  Previously seen  Other:  Fetus appears to be a female. Heels and LT 5th digit prev visualized.  ---------------------------------------------------------------------- Cervix Uterus Adnexa  Cervix  Not visualized (advanced GA >24wks) ---------------------------------------------------------------------- Impression  Normal interval growth.  SLE ---------------------------------------------------------------------- Recommendations  Continue serial growth exams every 4 weeks. ----------------------------------------------------------------------               Lin Landsmanorenthian Booker, MD Electronically Signed Final Report   02/25/2019 07:22 am ----------------------------------------------------------------------   Assessment and Plan:  Pregnancy: G1P0 at 5657w4d 1. Supervision of normal first pregnancy, antepartum - patient doing well - Routine prenatal care - Anticipatory guidance on upcoming appointments with next being in person at 36 weeks for GBS screening  - Patient was seen in MAU on 6/30 for pain and was placed on a low fat diet, patient reports pain is resolved since starting diet - Reports occasional RLP, patients reports support belt is helping RLP.   2. Iron deficiency anemia during pregnancy, antepartum - Continue iron supplementation BID   3. Systemic lupus erythematosus (SLE) affecting pregnancy, antepartum (HCC) - stable at this time   Preterm labor symptoms and general obstetric precautions including but not limited to vaginal bleeding, contractions, leaking of fluid and fetal movement were reviewed in detail with the patient. I discussed the assessment and treatment plan with the patient. The patient was provided an opportunity to ask questions and all were answered. The patient agreed with the plan and demonstrated an understanding of the instructions. The patient was advised to call back or seek an in-person office evaluation/[facility] for any urgent or concerning symptoms. Please refer to After Visit Summary for other counseling recommendations.   I  provided 15 minutes of face-to-face time during this encounter.  Return in about 4 weeks (around 03/26/2019) for ROB/GBS.  Future Appointments  Date Time Provider Department Center  03/24/2019  8:30 AM [facility] NURSE [facility]  03/24/2019  8:30 AM [facility] US 1 [facility]US [facility]-US  03/26/2019  3:30 PM Sharyon Cableogers, Takira Sherrin C, CNM [facility] None    Sharyon CableVeronica C Tayon Parekh, CNM [name]

## 2019-03-10 ENCOUNTER — Other Ambulatory Visit: Payer: Self-pay

## 2019-03-10 ENCOUNTER — Encounter (HOSPITAL_COMMUNITY): Payer: Self-pay

## 2019-03-10 ENCOUNTER — Inpatient Hospital Stay (HOSPITAL_BASED_OUTPATIENT_CLINIC_OR_DEPARTMENT_OTHER): Payer: BC Managed Care – PPO

## 2019-03-10 ENCOUNTER — Inpatient Hospital Stay (HOSPITAL_COMMUNITY)
Admission: AD | Admit: 2019-03-10 | Discharge: 2019-03-10 | Disposition: A | Discharge status: Home / Self Care | Payer: BC Managed Care – PPO | Attending: Obstetrics and Gynecology | Admitting: Obstetrics and Gynecology

## 2019-03-10 DIAGNOSIS — O288 Other abnormal findings on antenatal screening of mother: Secondary | ICD-10-CM | POA: Diagnosis not present

## 2019-03-10 DIAGNOSIS — O98813 Other maternal infectious and parasitic diseases complicating pregnancy, third trimester: Secondary | ICD-10-CM | POA: Insufficient documentation

## 2019-03-10 DIAGNOSIS — B3731 Acute candidiasis of vulva and vagina: Secondary | ICD-10-CM

## 2019-03-10 DIAGNOSIS — B379 Candidiasis, unspecified: Secondary | ICD-10-CM | POA: Diagnosis not present

## 2019-03-10 DIAGNOSIS — M329 Systemic lupus erythematosus, unspecified: Secondary | ICD-10-CM

## 2019-03-10 DIAGNOSIS — Z3A34 34 weeks gestation of pregnancy: Secondary | ICD-10-CM | POA: Insufficient documentation

## 2019-03-10 DIAGNOSIS — Z3A35 35 weeks gestation of pregnancy: Secondary | ICD-10-CM | POA: Diagnosis not present

## 2019-03-10 DIAGNOSIS — B373 Candidiasis of vulva and vagina: Secondary | ICD-10-CM | POA: Diagnosis not present

## 2019-03-10 DIAGNOSIS — O26893 Other specified pregnancy related conditions, third trimester: Secondary | ICD-10-CM | POA: Diagnosis present

## 2019-03-10 DIAGNOSIS — Z87891 Personal history of nicotine dependence: Secondary | ICD-10-CM | POA: Insufficient documentation

## 2019-03-10 LAB — URINALYSIS, ROUTINE W REFLEX MICROSCOPIC
Bacteria, UA: NONE SEEN
Bilirubin Urine: NEGATIVE
Glucose, UA: NEGATIVE mg/dL
Hgb urine dipstick: NEGATIVE
Ketones, ur: NEGATIVE mg/dL
Leukocytes,Ua: NEGATIVE
Nitrite: NEGATIVE
Protein, ur: 30 mg/dL — AB
Specific Gravity, Urine: 1.016 (ref 1.005–1.030)
pH: 7 (ref 5.0–8.0)

## 2019-03-10 LAB — WET PREP, GENITAL
Sperm: NONE SEEN
Trich, Wet Prep: NONE SEEN
Yeast Wet Prep HPF POC: NONE SEEN

## 2019-03-10 LAB — OB RESULTS CONSOLE GC/CHLAMYDIA: Gonorrhea: POSITIVE

## 2019-03-10 MED ORDER — TERCONAZOLE 0.4 % VA CREA: 1.0000 | TOPICAL_CREAM | Freq: Every day | VAGINAL | 0 refills | Status: DC

## 2019-03-10 NOTE — MAU Note (Signed)
Pt states she is having pressure when she urinates for about 1-2 weeks. In addition, the pt states she feels like she has also been leaking fluid since then, but only when she pees. Pt also reports fowl odor coming her from vagina.   She states she is here today because it is now sensitive when she wipes. The sensitivity started yesterday.   Denies vaginal bleeding. Reports +FM

## 2019-03-10 NOTE — Discharge Instructions (Signed)
Terazol vaginal cream prescribed. Keep all appointments as scheduled at the office. Continue to wear mask when in public. Return for vaginal bleeding, decreased fetal movement or contractions that are more than 4 in an hour. Drink at least 8 8-oz glasses of water every day.

## 2019-03-10 NOTE — MAU Provider Note (Signed)
History     CSN: [id number]  Arrival date and time: 03/10/19 1225   First Provider Initiated Contact with Patient 03/10/19 1332      Chief Complaint  Patient presents with  . Rupture of Membranes  . Vaginal Discharge   HPI Kelly MelnickJemariah M [name] 23 y.o. 1041w2d  Comes to MAU stating the office wanted her to come here for evaluation.  States she is having some leaking but only notices leaking when she goes to urinate.  Feeling like she is irritated in the genital area.  Has been treated for yeast and BV in this pregnancy.  OB History    Gravida  1   Para      Term      Preterm      AB      Living        SAB      TAB      Ectopic      Multiple      Live Births              Past Medical History:  Diagnosis Date  . Anxiety   . Asthma   . Chest discomfort feb 2017   Pt states due to shortness of breath  . Cough present for greater than 3 weeks   . Lupus (systemic lupus erythematosus) (HCC)    dx 2010  . Pneumonia    2015  . Shortness of breath dyspnea feb 2017   changed inhaler with relief    Past Surgical History:  Procedure Laterality Date  . BRONCHOSCOPY  11/2013  . WISDOM TOOTH EXTRACTION  2014    Family History  Problem Relation Age of Onset  . Diabetes Paternal Grandmother     Social History   Tobacco Use  . Smoking status: Former Smoker    Types: Cigarettes  . Smokeless tobacco: Never Used  Substance Use Topics  . Alcohol use: Not Currently  . Drug use: Not Currently    Types: Marijuana    Allergies: No Known Allergies  Medications Prior to Admission  Medication Sig Dispense Refill Last Dose  . acetaminophen (TYLENOL) 500 MG tablet Take 1,000 mg by mouth every 6 (six) hours as needed for moderate pain or headache.   03/09/2019 at Unknown time  . aspirin 81 MG chewable tablet Chew by mouth daily.   03/10/2019 at Unknown time  . ferrous sulfate 325 (65 FE) MG tablet Take 1 tablet (325 mg total) by mouth 2 (two) times daily with a meal.  60 tablet 2 03/10/2019 at Unknown time  . hydroxychloroquine (PLAQUENIL) 200 MG tablet hydroxychloroquine 200 mg tablet  2 TABLET WITH FOOD OR MILK ONCE A DAY   03/10/2019 at Unknown time  . Omeprazole (PRILOSEC PO) Take by mouth.   03/10/2019 at Unknown time  . predniSONE (DELTASONE) 5 MG tablet Take 5 mg by mouth daily.    03/10/2019 at Unknown time  . Prenat-FePoly-Metf-FA-DHA-DSS (VITAFOL FE+) 90-1-200 & 50 MG CPPK Take 1 tablet by mouth daily. 60 each 5 Past Week at Unknown time  . Prenatal Vit-Fe Fumarate-FA (MULTIVITAMIN-PRENATAL) 27-0.8 MG TABS tablet Take 1 tablet by mouth daily at 12 noon.   03/10/2019 at Unknown time  . albuterol (VENTOLIN HFA) 108 (90 Base) MCG/ACT inhaler Ventolin HFA 90 mcg/actuation aerosol inhaler  INHALE 1 TO 2 PUFFS INTO THE LUNGS EVERY 6 HOURS AS NEEDED FOR WHEEZING OR SHORTNESS OF BREATH   More than a month at Unknown time  . albuterol (VENTOLIN  HFA) 108 (90 Base) MCG/ACT inhaler INHALE 1-2 PUFFS INTO THE LUNGS EVERY 6 (SIX) HOURS AS NEEDED FOR WHEEZING OR SHORTNESS OF BREATH. 6.7 Inhaler 0 More than a month at Unknown time  . benzonatate (TESSALON PERLES) 100 MG capsule Take 2 capsules (200 mg total) by mouth every 6 (six) hours as needed for cough. (Patient not taking: Reported on 02/24/2019) 30 capsule 0   . Budesonide 90 MCG/ACT inhaler Inhale 1 puff into the lungs 2 (two) times daily. (Patient not taking: Reported on 02/24/2019) 1 Inhaler 1   . cyclobenzaprine (FLEXERIL) 10 MG tablet cyclobenzaprine 10 mg tablet  1/2-1 TABLET AS NEEDED THREE TIMES A DAY ORALLY 30 DAYS     . hydroxychloroquine (PLAQUENIL) 200 MG tablet Take 1 tablet (200 mg total) by mouth daily. (Patient not taking: Reported on 02/26/2019) 60 tablet 1   . metroNIDAZOLE (FLAGYL) 500 MG tablet Take 1 tablet (500 mg total) by mouth 2 (two) times daily. (Patient not taking: Reported on 02/24/2019) 14 tablet 0   . polyvinyl alcohol (LIQUIFILM TEARS) 1.4 % ophthalmic solution Place 1 drop into both eyes 3  (three) times daily as needed for dry eyes.     . potassium chloride (K-DUR) 10 MEQ tablet Take 1 tablet (10 mEq total) by mouth 2 (two) times daily for 5 days. 10 tablet 0   . promethazine (PHENERGAN) 6.25 MG/5ML syrup Take 5 mLs (6.25 mg total) by mouth 4 (four) times daily as needed for nausea or vomiting. (Patient not taking: Reported on 02/24/2019) 120 mL 1   . vitamin B-12 (CYANOCOBALAMIN) 1000 MCG tablet Take 1 tablet (1,000 mcg total) by mouth daily. (Patient not taking: Reported on 04/24/2017) 90 tablet 2     Review of Systems  Constitutional: Negative for fever.  Respiratory: Negative for cough and shortness of breath.   Gastrointestinal: Negative for abdominal pain, nausea and vomiting.  Genitourinary: Positive for vaginal discharge. Negative for dysuria and vaginal bleeding.       Leaking when urinating   Physical Exam   Blood pressure 118/79, pulse (!) 108, temperature 98.5 F (36.9 C), temperature source Oral, resp. rate 20, weight 62.7 kg, last menstrual period 07/13/2018, SpO2 100 %.  Physical Exam  Nursing note and vitals reviewed. Constitutional: She is oriented to person, place, and time. She appears well-developed and well-nourished.  HENT:  Head: Normocephalic.  Eyes: EOM are normal.  Neck: Neck supple.  Cardiovascular: Normal rate.  Respiratory: Effort normal. No respiratory distress.  GI: Soft. There is no abdominal tenderness. There is no rebound and no guarding.  FHT baseline 145 with moderate variability and no accelerations.  Mild uterine Deberah PeltonBraxton Hicks.  Had one 4 minute variable deceleration that was recovering spontaneously when provider goes into the room and then was completely recovered when client turned to right side.  Still no accels with movement so BPP done.  BPP 8/8 and after BPP FHT baseline is 130 with moderate variability and 15x15 accels seen and strip at this time meets the criteria as reactive.  Genitourinary:    Genitourinary Comments: Speculum  exam: Vagina -No discharge at introitus, Small amount of Liquid pale yellow discharge, no adherent discharge, no leaking with valsalva, no pooling  Cervix - No contact bleeding Bimanual exam:  FT, 50%, vertex, -3 Cervix  Uterus non tender, normal size Adnexa non tender, no masses bilaterally GC/Chlam, wet prep done Chaperone present for exam.    Musculoskeletal: Normal range of motion.  Neurological: She is alert and oriented to  person, place, and time.  Skin: Skin is warm and dry.  Hypopigmented areas all over her body c/w lupus  Psychiatric: She has a normal mood and affect.    MAU Course  Procedures Results for orders placed or performed during the hospital encounter of 03/10/19 (from the past 24 hour(s))  Urinalysis, Routine w reflex microscopic     Status: Abnormal   Collection Time: 03/10/19  1:05 PM  Result Value Ref Range   Color, Urine YELLOW YELLOW   APPearance CLEAR CLEAR   Specific Gravity, Urine 1.016 1.005 - 1.030   pH 7.0 5.0 - 8.0   Glucose, UA NEGATIVE NEGATIVE mg/dL   Hgb urine dipstick NEGATIVE NEGATIVE   Bilirubin Urine NEGATIVE NEGATIVE   Ketones, ur NEGATIVE NEGATIVE mg/dL   Protein, ur 30 (A) NEGATIVE mg/dL   Nitrite NEGATIVE NEGATIVE   Leukocytes,Ua NEGATIVE NEGATIVE   RBC / HPF 0-5 0 - 5 RBC/hpf   WBC, UA 0-5 0 - 5 WBC/hpf   Bacteria, UA NONE SEEN NONE SEEN   Squamous Epithelial / LPF 0-5 0 - 5   Mucus PRESENT   Wet prep, genital     Status: Abnormal   Collection Time: 03/10/19  1:47 PM   Specimen: Cervical/Vaginal swab  Result Value Ref Range   Yeast Wet Prep HPF POC NONE SEEN NONE SEEN   Trich, Wet Prep NONE SEEN NONE SEEN   Clue Cells Wet Prep HPF POC PRESENT (A) NONE SEEN   WBC, Wet Prep HPF POC MANY (A) NONE SEEN   Sperm NONE SEEN     MDM Client requests cervical exam  - wants to have baby soon.  Wants to know when she will be induced. BPP done as strip was nonreactive and while client was trying to sit up further in bed and coughing a  4 minute variable decelerations occurred.  On right side, FHT recovered well but was not reactive. BPP 8/8 On physical exam, vaginal tissue and discharge consistent with yeast infection, so will treat for yeast.  Assessment and Plan  Yeast infection BPP 8/8 [redacted]w[redacted]d  Plan Terazol vaginal cream prescribed. Keep all appointments as scheduled at the office. Continue to wear mask when in public. Return for vaginal bleeding, decreased fetal movement or contractions that are more than 4 in an hour. Drink at least 8 8-oz glasses of water every day. Expect to have a baby at 61 weeks or after for normal labor.  We do not want her baby to be born preterm if at all possible but cervix is currently still 50%.   Encouraged to attend free online childbirth and breastfeeding classes - advised to find website on babyscripts. See AVS for additional info to client.   Terri L Burleson 03/10/2019, 1:50 PM

## 2019-03-11 LAB — GC/CHLAMYDIA PROBE AMP (~~LOC~~) NOT AT ARMC
Chlamydia: NEGATIVE
Neisseria Gonorrhea: POSITIVE — AB

## 2019-03-12 ENCOUNTER — Telehealth: Payer: Self-pay | Admitting: *Deleted

## 2019-03-12 NOTE — Telephone Encounter (Signed)
Pt called to office for lab results from recent hospital visit.  Attempt to return call. LM on VM to call office.

## 2019-03-13 ENCOUNTER — Other Ambulatory Visit: Payer: Self-pay

## 2019-03-13 ENCOUNTER — Ambulatory Visit (INDEPENDENT_AMBULATORY_CARE_PROVIDER_SITE_OTHER): Payer: BC Managed Care – PPO

## 2019-03-13 VITALS — BP 115/82 | HR 107 | Wt 134.0 lb

## 2019-03-13 DIAGNOSIS — O26893 Other specified pregnancy related conditions, third trimester: Secondary | ICD-10-CM | POA: Diagnosis not present

## 2019-03-13 DIAGNOSIS — O98213 Gonorrhea complicating pregnancy, third trimester: Secondary | ICD-10-CM

## 2019-03-13 DIAGNOSIS — N898 Other specified noninflammatory disorders of vagina: Secondary | ICD-10-CM

## 2019-03-13 DIAGNOSIS — Z3480 Encounter for supervision of other normal pregnancy, unspecified trimester: Secondary | ICD-10-CM

## 2019-03-13 MED ORDER — CEFTRIAXONE SODIUM 250 MG IJ SOLR
250.0000 mg | Freq: Once | INTRAMUSCULAR | Status: AC
  Administered 2019-03-13: 250 mg via INTRAMUSCULAR

## 2019-03-13 MED ORDER — AZITHROMYCIN 500 MG PO TABS
1000.0000 mg | ORAL_TABLET | Freq: Once | ORAL | Status: AC
  Administered 2019-03-13: 10:00:00 1000 mg via ORAL

## 2019-03-13 NOTE — Progress Notes (Signed)
SUBJECTIVE: Presented for Rocephin Injection and Azithromycin 1000 mg Tablets treatment due to +GC.  OBJECTIVE: Office Supplied Injection given in [place], tolerated well.  Office supplied 2  500 MG Azithromycin taken, tolerated well.  PLAN: Return 4-6 for TOC. Partner needs to be treated.     Administrations This Visit    azithromycin (ZITHROMAX) tablet 1,000 mg    Admin Date 03/13/2019 Action Given Dose 1000 mg Route Oral Administered By Tamela Oddi, RMA           Administrations This Visit    azithromycin [facility]) tablet 1,000 mg    Admin Date 03/13/2019 Action Given Dose 1000 mg Route Oral Administered By Tamela Oddi, RMA       cefTRIAXone (ROCEPHIN) injection 250 mg    Admin Date 03/13/2019 Action Given Dose 250 mg Route Intramuscular Administered By Tamela Oddi, RMA

## 2019-03-15 ENCOUNTER — Encounter (HOSPITAL_COMMUNITY): Payer: Self-pay | Admitting: *Deleted

## 2019-03-15 ENCOUNTER — Inpatient Hospital Stay (HOSPITAL_COMMUNITY): Payer: BC Managed Care – PPO | Admitting: Anesthesiology

## 2019-03-15 ENCOUNTER — Other Ambulatory Visit: Payer: Self-pay

## 2019-03-15 ENCOUNTER — Inpatient Hospital Stay (HOSPITAL_COMMUNITY)
Admission: AD | Admit: 2019-03-15 | Discharge: 2019-03-19 | DRG: 788 | Disposition: A | Discharge status: Home / Self Care | Payer: BC Managed Care – PPO | Attending: Family Medicine | Admitting: Family Medicine

## 2019-03-15 DIAGNOSIS — Z98891 History of uterine scar from previous surgery: Secondary | ICD-10-CM

## 2019-03-15 DIAGNOSIS — O9989 Other specified diseases and conditions complicating pregnancy, childbirth and the puerperium: Secondary | ICD-10-CM | POA: Diagnosis present

## 2019-03-15 DIAGNOSIS — Z87891 Personal history of nicotine dependence: Secondary | ICD-10-CM

## 2019-03-15 DIAGNOSIS — Z1159 Encounter for screening for other viral diseases: Secondary | ICD-10-CM | POA: Diagnosis not present

## 2019-03-15 DIAGNOSIS — M329 Systemic lupus erythematosus, unspecified: Secondary | ICD-10-CM | POA: Diagnosis present

## 2019-03-15 DIAGNOSIS — O4290 Premature rupture of membranes, unspecified as to length of time between rupture and onset of labor, unspecified weeks of gestation: Secondary | ICD-10-CM | POA: Diagnosis present

## 2019-03-15 DIAGNOSIS — O1404 Mild to moderate pre-eclampsia, complicating childbirth: Secondary | ICD-10-CM | POA: Diagnosis present

## 2019-03-15 DIAGNOSIS — O42913 Preterm premature rupture of membranes, unspecified as to length of time between rupture and onset of labor, third trimester: Secondary | ICD-10-CM | POA: Diagnosis present

## 2019-03-15 DIAGNOSIS — Z3A35 35 weeks gestation of pregnancy: Secondary | ICD-10-CM

## 2019-03-15 DIAGNOSIS — O9952 Diseases of the respiratory system complicating childbirth: Secondary | ICD-10-CM | POA: Diagnosis present

## 2019-03-15 DIAGNOSIS — O1400 Mild to moderate pre-eclampsia, unspecified trimester: Secondary | ICD-10-CM | POA: Clinically undetermined

## 2019-03-15 DIAGNOSIS — J45909 Unspecified asthma, uncomplicated: Secondary | ICD-10-CM | POA: Diagnosis present

## 2019-03-15 DIAGNOSIS — O42013 Preterm premature rupture of membranes, onset of labor within 24 hours of rupture, third trimester: Secondary | ICD-10-CM | POA: Diagnosis not present

## 2019-03-15 DIAGNOSIS — D509 Iron deficiency anemia, unspecified: Secondary | ICD-10-CM | POA: Diagnosis present

## 2019-03-15 DIAGNOSIS — N1330 Unspecified hydronephrosis: Secondary | ICD-10-CM

## 2019-03-15 DIAGNOSIS — O98213 Gonorrhea complicating pregnancy, third trimester: Secondary | ICD-10-CM

## 2019-03-15 DIAGNOSIS — O9902 Anemia complicating childbirth: Secondary | ICD-10-CM | POA: Diagnosis present

## 2019-03-15 DIAGNOSIS — O99891 Other specified diseases and conditions complicating pregnancy: Secondary | ICD-10-CM

## 2019-03-15 DIAGNOSIS — E876 Hypokalemia: Secondary | ICD-10-CM

## 2019-03-15 HISTORY — DX: Gonorrhea complicating pregnancy, third trimester: O98.213

## 2019-03-15 LAB — COMPREHENSIVE METABOLIC PANEL
ALT: 29 U/L (ref 0–44)
AST: 62 U/L — ABNORMAL HIGH (ref 15–41)
Albumin: 2 g/dL — ABNORMAL LOW (ref 3.5–5.0)
Alkaline Phosphatase: 123 U/L (ref 38–126)
Anion gap: 7 (ref 5–15)
BUN: 6 mg/dL (ref 6–20)
CO2: 20 mmol/L — ABNORMAL LOW (ref 22–32)
Calcium: 8 mg/dL — ABNORMAL LOW (ref 8.9–10.3)
Chloride: 108 mmol/L (ref 98–111)
Creatinine, Ser: 0.61 mg/dL (ref 0.44–1.00)
GFR calc Af Amer: >60 mL/min (ref >60)
GFR calc non Af Amer: >60 mL/min (ref >60)
Glucose, Bld: 66 mg/dL — ABNORMAL LOW (ref 70–99)
Potassium: 3.5 mmol/L (ref 3.5–5.1)
Sodium: 135 mmol/L (ref 135–145)
Total Bilirubin: 0.5 mg/dL (ref 0.3–1.2)
Total Protein: 6.7 g/dL (ref 6.5–8.1)

## 2019-03-15 LAB — URINALYSIS, ROUTINE W REFLEX MICROSCOPIC
Bilirubin Urine: NEGATIVE
Glucose, UA: NEGATIVE mg/dL
Ketones, ur: NEGATIVE mg/dL
Leukocytes,Ua: NEGATIVE
Nitrite: NEGATIVE
Protein, ur: 100 mg/dL — AB
RBC / HPF: >50 RBC/hpf — ABNORMAL HIGH (ref 0–5)
Specific Gravity, Urine: 1.006 (ref 1.005–1.030)
pH: 7 (ref 5.0–8.0)

## 2019-03-15 LAB — PROTEIN / CREATININE RATIO, URINE
Creatinine, Urine: 28.59 mg/dL
Protein Creatinine Ratio: 0.77 mg/mg{Cre} — ABNORMAL HIGH (ref 0.00–0.15)
Total Protein, Urine: 22 mg/dL

## 2019-03-15 LAB — CBC
HCT: 29.4 % — ABNORMAL LOW (ref 36.0–46.0)
Hemoglobin: 9.6 g/dL — ABNORMAL LOW (ref 12.0–15.0)
MCH: 30 pg (ref 26.0–34.0)
MCHC: 32.7 g/dL (ref 30.0–36.0)
MCV: 91.9 fL (ref 80.0–100.0)
Platelets: 375 10*3/uL (ref 150–400)
RBC: 3.2 MIL/uL — ABNORMAL LOW (ref 3.87–5.11)
RDW: 16.7 % — ABNORMAL HIGH (ref 11.5–15.5)
WBC: 5.7 10*3/uL (ref 4.0–10.5)
nRBC: 0 % (ref 0.0–0.2)

## 2019-03-15 LAB — ABO/RH: ABO/RH(D): B POS

## 2019-03-15 LAB — TYPE AND SCREEN
ABO/RH(D): B POS
Antibody Screen: NEGATIVE

## 2019-03-15 LAB — SARS CORONAVIRUS 2 BY RT PCR (HOSPITAL ORDER, PERFORMED IN ~~LOC~~ HOSPITAL LAB): SARS Coronavirus 2: NEGATIVE

## 2019-03-15 LAB — POCT FERN TEST: POCT Fern Test: POSITIVE

## 2019-03-15 MED ORDER — OXYCODONE-ACETAMINOPHEN 5-325 MG PO TABS: 1.0000 | ORAL_TABLET | ORAL | Status: DC | PRN

## 2019-03-15 MED ORDER — EPHEDRINE 5 MG/ML INJ: 10.0000 mg | INTRAVENOUS | Status: DC | PRN

## 2019-03-15 MED ORDER — LACTATED RINGERS AMNIOINFUSION
INTRAVENOUS | Status: DC
  Administered 2019-03-15: 18:00:00 via INTRAUTERINE

## 2019-03-15 MED ORDER — SOD CITRATE-CITRIC ACID 500-334 MG/5ML PO SOLN
30.0000 mL | ORAL | Status: DC | PRN
  Administered 2019-03-15 – 2019-03-16 (×2): 30 mL via ORAL
  Filled 2019-03-15 (×2): qty 30

## 2019-03-15 MED ORDER — LACTATED RINGERS IV SOLN
500.0000 mL | INTRAVENOUS | Status: DC | PRN
  Administered 2019-03-15: 18:00:00 1000 mL via INTRAVENOUS

## 2019-03-15 MED ORDER — PREDNISONE 5 MG PO TABS
5.0000 mg | ORAL_TABLET | Freq: Every day | ORAL | Status: DC
  Filled 2019-03-15 (×2): qty 1

## 2019-03-15 MED ORDER — BETAMETHASONE SOD PHOS & ACET 6 (3-3) MG/ML IJ SUSP
12.0000 mg | Freq: Once | INTRAMUSCULAR | Status: AC
  Administered 2019-03-15: 12 mg via INTRAMUSCULAR
  Filled 2019-03-15: qty 2

## 2019-03-15 MED ORDER — OXYTOCIN BOLUS FROM INFUSION: 500.0000 mL | Freq: Once | INTRAVENOUS | Status: DC

## 2019-03-15 MED ORDER — LACTATED RINGERS IV SOLN
500.0000 mL | Freq: Once | INTRAVENOUS | Status: AC
  Administered 2019-03-15: 500 mL via INTRAVENOUS

## 2019-03-15 MED ORDER — PHENYLEPHRINE 40 MCG/ML (10ML) SYRINGE FOR IV PUSH (FOR BLOOD PRESSURE SUPPORT)
80.0000 ug | PREFILLED_SYRINGE | INTRAVENOUS | Status: DC | PRN
  Filled 2019-03-15: qty 10

## 2019-03-15 MED ORDER — TERBUTALINE SULFATE 1 MG/ML IJ SOLN
0.2500 mg | Freq: Once | INTRAMUSCULAR | Status: AC | PRN
  Administered 2019-03-15: 0.25 mg via SUBCUTANEOUS
  Filled 2019-03-15: qty 1

## 2019-03-15 MED ORDER — SODIUM CHLORIDE (PF) 0.9 % IJ SOLN
INTRAMUSCULAR | Status: DC | PRN
  Administered 2019-03-15: 12 mL/h via EPIDURAL

## 2019-03-15 MED ORDER — LIDOCAINE HCL (PF) 1 % IJ SOLN: 30.0000 mL | INTRAMUSCULAR | Status: DC | PRN

## 2019-03-15 MED ORDER — ACETAMINOPHEN 325 MG PO TABS: 650.0000 mg | ORAL_TABLET | ORAL | Status: DC | PRN

## 2019-03-15 MED ORDER — OXYTOCIN 40 UNITS IN NORMAL SALINE INFUSION - SIMPLE MED
1.0000 m[IU]/min | INTRAVENOUS | Status: DC
  Administered 2019-03-15: 2 m[IU]/min via INTRAVENOUS
  Filled 2019-03-15: qty 1000

## 2019-03-15 MED ORDER — PREDNISONE 5 MG PO TABS
5.0000 mg | ORAL_TABLET | Freq: Every day | ORAL | Status: DC
  Filled 2019-03-15: qty 1

## 2019-03-15 MED ORDER — FENTANYL-BUPIVACAINE-NACL 0.5-0.125-0.9 MG/250ML-% EP SOLN
12.0000 mL/h | EPIDURAL | Status: DC | PRN
  Filled 2019-03-15: qty 250

## 2019-03-15 MED ORDER — FLEET ENEMA 7-19 GM/118ML RE ENEM: 1.0000 | ENEMA | RECTAL | Status: DC | PRN

## 2019-03-15 MED ORDER — HYDROXYCHLOROQUINE SULFATE 200 MG PO TABS
300.0000 mg | ORAL_TABLET | Freq: Every day | ORAL | Status: DC
  Filled 2019-03-15: qty 1.5

## 2019-03-15 MED ORDER — PENICILLIN G 3 MILLION UNITS IVPB - SIMPLE MED
3.0000 10*6.[IU] | INTRAVENOUS | Status: DC
  Administered 2019-03-15 (×3): 3 10*6.[IU] via INTRAVENOUS
  Filled 2019-03-15 (×3): qty 100

## 2019-03-15 MED ORDER — ONDANSETRON HCL 4 MG/2ML IJ SOLN: 4.0000 mg | Freq: Four times a day (QID) | INTRAMUSCULAR | Status: DC | PRN

## 2019-03-15 MED ORDER — SODIUM CHLORIDE 0.9 % IV SOLN
5.0000 10*6.[IU] | Freq: Once | INTRAVENOUS | Status: AC
  Administered 2019-03-15: 5 10*6.[IU] via INTRAVENOUS
  Filled 2019-03-15: qty 5

## 2019-03-15 MED ORDER — DIPHENHYDRAMINE HCL 50 MG/ML IJ SOLN: 12.5000 mg | INTRAMUSCULAR | Status: DC | PRN

## 2019-03-15 MED ORDER — LACTATED RINGERS IV SOLN
INTRAVENOUS | Status: DC
  Administered 2019-03-15: 08:00:00 via INTRAVENOUS

## 2019-03-15 MED ORDER — LIDOCAINE HCL (PF) 1 % IJ SOLN
INTRAMUSCULAR | Status: DC | PRN
  Administered 2019-03-15: 5 mL via EPIDURAL

## 2019-03-15 MED ORDER — OXYTOCIN 40 UNITS IN NORMAL SALINE INFUSION - SIMPLE MED
2.5000 [IU]/h | INTRAVENOUS | Status: DC
  Filled 2019-03-15: qty 1000

## 2019-03-15 MED ORDER — OXYCODONE-ACETAMINOPHEN 5-325 MG PO TABS: 2.0000 | ORAL_TABLET | ORAL | Status: DC | PRN

## 2019-03-15 MED ORDER — PHENYLEPHRINE 40 MCG/ML (10ML) SYRINGE FOR IV PUSH (FOR BLOOD PRESSURE SUPPORT): 80.0000 ug | PREFILLED_SYRINGE | INTRAVENOUS | Status: DC | PRN

## 2019-03-15 MED ORDER — LACTATED RINGERS IV SOLN
INTRAVENOUS | Status: DC
  Administered 2019-03-15 (×3): via INTRAVENOUS

## 2019-03-15 NOTE — Progress Notes (Signed)
Spoke with Dr. Juleen China who is on call for [facility] pediatrics about mother declining coronavirus testing, she is asymptomatic. Informed Dr. Juleen China of nursery baby care recommendations for mothers that are asymptomatic and declining testing. She agrees with recommendations for plan of care.

## 2019-03-15 NOTE — Progress Notes (Signed)
Labor Progress Note Kelly [name] is a 23 y.o. G1P0 at [redacted]w[redacted]d presented for SROM  S:  Patient comfortable, sleeping. S/P terbutaline and d/c pitocin for prolonged deceleration at 1830  O:  BP 128/74   Pulse (!) 105   Temp 98.8 F (37.1 C) (Oral)   Resp 18   Ht 5\' 2"  (1.575 m)   Wt 62.6 kg   LMP 07/13/2018   SpO2 100%   BMI 25.24 kg/m   Fetal Tracing:  Baseline: 135 Variability: moderate Accels: 15x15 Decels: early  Toco: 3-7   CVE: Dilation: 6 Effacement (%): 90 Cervical Position: Anterior Station: 0 Presentation: Vertex Exam by:: Haynes Bast CNM    A&P: 23 y.o. G1P0 [redacted]w[redacted]d premature rupture of membranes #Labor: FHR reactive since d/c pitocin. Cervix slightly changed from last exam. Will restart pitocin and increase as long as FHR remains reassuring #Pain: epidural #FWB: Cat 1 #GBS unknown, PCN   Wende Mott, CNM 9:36 PM

## 2019-03-15 NOTE — H&P (Signed)
Obstetric History and Physical  Kelly [name] is a 23 y.o. G1P0 with IUP at 1991w0d presenting for PPROM. Patient states her water broke at 630 this morning & started painfully contracting soon after that. In MAU she was found to be grossly ruptured of clear fluid & had a positive fern slide.  She was treated for gonorrhea on 7/17.  Prenatal Course Source of Care: [facility]  with onset of care at 11 weeks Pregnancy complications or risks: Patient Active Problem List   Diagnosis Date Noted  . Gonorrhea affecting pregnancy in third trimester 03/15/2019  . Hypokalemia 02/24/2019  . Hydronephrosis of right kidney 02/24/2019  . Late Entry to Babyscripts- April 2020- Social Distancing  11/27/2018  . Iron deficiency anemia during pregnancy, antepartum 10/14/2018  . Supervision of normal first pregnancy, antepartum 10/01/2018  . Systemic lupus erythematosus (SLE) affecting pregnancy, antepartum (HCC) 06/04/2012   She plans on breast & bottle feeding She desires condoms for postpartum contraception.   Prenatal labs and studies: ABO, Rh: B/Positive/-- (02/05 1501) Antibody: Negative (02/05 1501) Rubella: 20.00 (02/05 1501) RPR: Non Reactive (06/04 1109)  HBsAg: Negative (02/05 1501)  HIV: Non Reactive (06/04 1109)  GBS:  pending 2hr GTT:  67/97/82 Genetic screening normal Anatomy US normal  Prenatal Transfer Tool  Maternal Diabetes: No Genetic Screening: Normal Maternal Ultrasounds/Referrals: Normal Fetal Ultrasounds or other Referrals:  Fetal echo d/t maternal lupus  Maternal Substance Abuse:  No Significant Maternal Medications:  None Significant Maternal Lab Results: Other:  gonorrhea 7/17  Past Medical History:  Diagnosis Date  . Anxiety   . Asthma   . Chest discomfort feb 2017   Pt states due to shortness of breath  . Cough present for greater than 3 weeks   . Lupus (systemic lupus erythematosus) (HCC)    dx 2010  . Pneumonia    2015  . Shortness of breath dyspnea feb  2017   changed inhaler with relief    Past Surgical History:  Procedure Laterality Date  . BRONCHOSCOPY  11/2013  . WISDOM TOOTH EXTRACTION  2014    OB History  Gravida Para Term Preterm AB Living  1            SAB TAB Ectopic Multiple Live Births               # Outcome Date GA Lbr Len/2nd Weight Sex Delivery Anes PTL Lv  1 Current             Social History   Socioeconomic History  . Marital status: Married    Spouse name: Not on file  . Number of children: Not on file  . Years of education: Not on file  . Highest education level: Not on file  Occupational History  . Not on file  Social Needs  . Financial resource strain: Not on file  . Food insecurity    Worry: Not on file    Inability: Not on file  . Transportation needs    Medical: Not on file    Non-medical: Not on file  Tobacco Use  . Smoking status: Former Smoker    Types: Cigarettes  . Smokeless tobacco: Never Used  Substance and Sexual Activity  . Alcohol use: Not Currently  . Drug use: Not Currently    Types: Marijuana  . Sexual activity: Yes    Birth control/protection: None  Lifestyle  . Physical activity    Days per week: Not on file    Minutes per session: Not  on file  . Stress: Not on file  Relationships  . Social Herbalist on phone: Not on file    Gets together: Not on file    Attends religious service: Not on file    Active member of club or organization: Not on file    Attends meetings of clubs or organizations: Not on file    Relationship status: Not on file  Other Topics Concern  . Not on file  Social History Narrative  . Not on file    Family History  Problem Relation Age of Onset  . Diabetes Paternal Grandmother     Medications Prior to Admission  Medication Sig Dispense Refill Last Dose  . acetaminophen (TYLENOL) 500 MG tablet Take 1,000 mg by mouth every 6 (six) hours as needed for moderate pain or headache.     . albuterol (VENTOLIN HFA) 108 (90 Base)  MCG/ACT inhaler Ventolin HFA 90 mcg/actuation aerosol inhaler  INHALE 1 TO 2 PUFFS INTO THE LUNGS EVERY 6 HOURS AS NEEDED FOR WHEEZING OR SHORTNESS OF BREATH     . albuterol (VENTOLIN HFA) 108 (90 Base) MCG/ACT inhaler INHALE 1-2 PUFFS INTO THE LUNGS EVERY 6 (SIX) HOURS AS NEEDED FOR WHEEZING OR SHORTNESS OF BREATH. 6.7 Inhaler 0   . aspirin 81 MG chewable tablet Chew by mouth daily.     . cyclobenzaprine (FLEXERIL) 10 MG tablet cyclobenzaprine 10 mg tablet  1/2-1 TABLET AS NEEDED THREE TIMES A DAY ORALLY 30 DAYS     . ferrous sulfate 325 (65 FE) MG tablet Take 1 tablet (325 mg total) by mouth 2 (two) times daily with a meal. 60 tablet 2   . Omeprazole (PRILOSEC PO) Take by mouth.     . polyvinyl alcohol (LIQUIFILM TEARS) 1.4 % ophthalmic solution Place 1 drop into both eyes 3 (three) times daily as needed for dry eyes.     . potassium chloride (K-DUR) 10 MEQ tablet Take 1 tablet (10 mEq total) by mouth 2 (two) times daily for 5 days. 10 tablet 0   . predniSONE (DELTASONE) 5 MG tablet Take 5 mg by mouth daily.      . Prenat-FePoly-Metf-FA-DHA-DSS (VITAFOL FE+) 90-1-200 & 50 MG CPPK Take 1 tablet by mouth daily. 60 each 5   . Prenatal Vit-Fe Fumarate-FA (MULTIVITAMIN-PRENATAL) 27-0.8 MG TABS tablet Take 1 tablet by mouth daily at 12 noon.     Marland Kitchen terconazole (TERAZOL 7) 0.4 % vaginal cream Place 1 applicator vaginally at bedtime. Use for 7 days. 45 g 0     No Known Allergies  Review of Systems: Negative except for what is mentioned in HPI.  Physical Exam: BP 119/86 (BP Location: Left Arm)   Pulse 98   Temp 97.9 F (36.6 C) (Oral)   Resp 18   LMP 07/13/2018   SpO2 100%  CONSTITUTIONAL: Well-developed, well-nourished female in no acute distress.  HENT:  Normocephalic, atraumatic, External right and left ear normal. Oropharynx is clear and moist EYES: Conjunctivae and EOM are normal. Pupils are equal, round, and reactive to light. No scleral icterus.  NECK: Normal range of motion, supple, no  masses SKIN: Skin is warm and dry. No rash noted. Not diaphoretic. No erythema. No pallor. NEUROLOGIC: Alert and oriented to person, place, and time. Normal reflexes, muscle tone coordination. No cranial nerve deficit noted. PSYCHIATRIC: Normal mood and affect. Normal behavior. Normal judgment and thought content. CARDIOVASCULAR: Normal heart rate noted, regular rhythm RESPIRATORY: Effort and breath sounds normal, no problems with  respiration noted ABDOMEN: Soft, nontender, nondistended, gravid. MUSCULOSKELETAL: Normal range of motion. No edema and no tenderness. 2+ distal pulses.  Cervical Exam: Dilation: 1 Effacement (%): 50 Station: -3 Presentation: Vertex Exam by:: Judeth HornErin Laird Runnion NP   Presentation: cephalic FHT:  NST:  Baseline: 140 bpm, Variability: Good {> 6 bpm), Accelerations: 10x10, Decelerations: Late decel x 1 and ctx every 7 minutes   Pertinent Labs/Studies:   Results for orders placed or performed during the hospital encounter of 03/15/19 (from the past 24 hour(s))  Urinalysis, Routine w reflex microscopic     Status: Abnormal   Collection Time: 03/15/19  7:56 AM  Result Value Ref Range   Color, Urine YELLOW YELLOW   APPearance CLOUDY (A) CLEAR   Specific Gravity, Urine 1.006 1.005 - 1.030   pH 7.0 5.0 - 8.0   Glucose, UA NEGATIVE NEGATIVE mg/dL   Hgb urine dipstick MODERATE (A) NEGATIVE   Bilirubin Urine NEGATIVE NEGATIVE   Ketones, ur NEGATIVE NEGATIVE mg/dL   Protein, ur 295100 (A) NEGATIVE mg/dL   Nitrite NEGATIVE NEGATIVE   Leukocytes,Ua NEGATIVE NEGATIVE   RBC / HPF >50 (H) 0 - 5 RBC/hpf   WBC, UA 11-20 0 - 5 WBC/hpf   Bacteria, UA RARE (A) NONE SEEN   Squamous Epithelial / LPF 11-20 0 - 5  Fern Test     Status: None   Collection Time: 03/15/19  7:57 AM  Result Value Ref Range   POCT Fern Test Positive = ruptured amniotic membanes     Assessment : Kelly [name] is a 23 y.o. G1P0 at 2474w0d being admitted for PPROM.   Plan: Labor: Expectant management.   Analgesia as needed. FWB: Reassuring fetal heart tracing.   GBS pending, will get penicillin while laboring BMZ series started Delivery plan: Hopeful for vaginal delivery  Judeth HornLawrence, Adryanna Friedt, NP

## 2019-03-15 NOTE — MAU Note (Signed)
RETTA PITCHER is a 23 y.o. at [redacted]w[redacted]d here in MAU reporting: LOF since 0634, it is clear. Reports she had a big gush and is still leaking. Contractions started when she was on the way here. States they come every 5-6 minutes. No bleeding. +FM. Denies any complications with pregnancy.  Onset of complaint: today  Pain score: 10/10  Vitals:   03/15/19 0738  BP: 119/86  Pulse: 98  Resp: 18  Temp: 97.9 F (36.6 C)  SpO2: 100%     FHT: +FM  Lab orders placed from triage: UA, unable to give sample at this time

## 2019-03-15 NOTE — Progress Notes (Signed)
LABOR PROGRESS NOTE  Kelly [name] is a 23 y.o. G1P0 at [redacted]w[redacted]d  admitted for PPROM.  Subjective: She is doing well resting in bed. She is in not experiencing discomfort with contractions.  Objective: BP 130/87   Pulse 90   Temp 98.9 F (37.2 C) (Oral)   Resp 16   Ht 5\' 2"  (1.575 m)   Wt 62.6 kg   LMP 07/13/2018   SpO2 100%   BMI 25.24 kg/m  or  Vitals:   03/15/19 1602 03/15/19 1631 03/15/19 1701 03/15/19 1731  BP: 118/80 131/86 129/87 130/87  Pulse: 91 86 86 90  Resp: 18 18 16 16   Temp:  98.9 F (37.2 C)    TempSrc:  Oral    SpO2:      Weight:      Height:         Dilation: 5 Effacement (%): 100 Station: -2 Presentation: Vertex Exam by:: M. Bhambri,CNM FHT: 130 bpm baseline rate , moderate varibility, 15 x15 acel, multiple variable decels Toco: 2-3 mins  Labs: Lab Results  Component Value Date   WBC 5.7 03/15/2019   HGB 9.6 (L) 03/15/2019   HCT 29.4 (L) 03/15/2019   MCV 91.9 03/15/2019   PLT 375 03/15/2019    Patient Active Problem List   Diagnosis Date Noted  . Gonorrhea affecting pregnancy in third trimester 03/15/2019  . Premature rupture of membranes 03/15/2019  . Hypokalemia 02/24/2019  . Hydronephrosis of right kidney 02/24/2019  . Late Entry to Babyscripts- April 2020- Social Distancing  11/27/2018  . Iron deficiency anemia during pregnancy, antepartum 10/14/2018  . Supervision of normal first pregnancy, antepartum 10/01/2018  . Systemic lupus erythematosus (SLE) affecting pregnancy, antepartum ([place]) 06/04/2012    Assessment / Plan: 23 y.o. G1P0 at [redacted]w[redacted]d here for PPROM  Labor: Cervix is unchanged from last exam. Pitocin stopped due to Cat II FHT, fluid bolus given. IUPC placed and amnioinfusion started. Fetal Wellbeing:  Cat II corrected to Cat I with proper interventions. Pain Control:  Epidural Anticipated MOD:  Vaginal   Evalena Fujii Autry-Lott, D.O. Family Medicine Resident, PGY-1  03/15/2019, 6:29 PM

## 2019-03-15 NOTE — Progress Notes (Signed)
Labor Progress Note Kelly [name] is a 23 y.o. G1P0 at [redacted]w[redacted]d presented for PROM  S:  Comfortable with epidural. No c/o.   O:  BP 133/83   Pulse 84   Temp 98.2 F (36.8 C) (Oral)   Resp 18   Ht 5\' 2"  (1.575 m)   Wt 62.6 kg   LMP 07/13/2018   SpO2 100%   BMI 25.24 kg/m  EFM: baseline 130 bpm/ mod variability/ + accels/ no decels  Toco: 3-4 SVE: Dilation: 4 Effacement (%): 80 Station: -2 Presentation: Vertex Exam by:: Volanda Mangine, CNM  A/P: 23 y.o. G1P0 [redacted]w[redacted]d  1. Labor: early active 2. FWB: Cat I 3. Pain: epidural 4. GBS unknown: PCN  Expectant mngt. Anticipate labor progress and SVD.  Julianne Handler, CNM 1:02 PM

## 2019-03-15 NOTE — Anesthesia Procedure Notes (Signed)
Epidural Patient location during procedure: OB Start time: 03/15/2019 10:11 AM End time: 03/15/2019 10:31 AM  Staffing Anesthesiologist: Barnet Glasgow, MD Performed: anesthesiologist   Preanesthetic Checklist Completed: patient identified, site marked, surgical consent, pre-op evaluation, timeout performed, IV checked, risks and benefits discussed and monitors and equipment checked  Epidural Patient position: sitting Prep: site prepped and draped and DuraPrep Patient monitoring: continuous pulse ox and blood pressure Approach: midline Location: L3-L4 Injection technique: LOR air  Needle:  Needle type: Tuohy  Needle gauge: 17 G Needle length: 9 cm and 9 Needle insertion depth: 6 cm Catheter type: closed end flexible Catheter size: 19 Gauge Catheter at skin depth: 12 cm Test dose: negative  Assessment Events: blood not aspirated, injection not painful, no injection resistance, negative IV test and no paresthesia  Additional Notes Patient identified. Risks/Benefits/Options discussed with patient including but not limited to bleeding, infection, nerve damage, paralysis, failed block, incomplete pain control, headache, blood pressure changes, nausea, vomiting, reactions to medication both or allergic, itching and postpartum back pain. Confirmed with bedside nurse the patient's most recent platelet count. Confirmed with patient that they are not currently taking any anticoagulation, have any bleeding history or any family history of bleeding disorders. Patient expressed understanding and wished to proceed. All questions were answered. Sterile technique was used throughout the entire procedure. Please see nursing notes for vital signs. Test dose was given through epidural needle and negative prior to continuing to dose epidural or start infusion. Warning signs of high block given to the patient including shortness of breath, tingling/numbness in hands, complete motor block, or any  concerning symptoms with instructions to call for help. Patient was given instructions on fall risk and not to get out of bed. All questions and concerns addressed with instructions to call with any issues. 1 Attempt (S) . Patient tolerated procedure well.

## 2019-03-15 NOTE — Progress Notes (Signed)
CNM called to bedside for prologed deceleration with nadir in the 90s for 7 minutes. Pitocin stopped, IV fluid bolus started and position changed. FSE placed and Dr. Nehemiah Settle notified. FHR returned to baseline of 150 after interventions. Will monitor for 15 minutes and restart pitocin. Dr. Nehemiah Settle at bedside counseling patient about possibility for c/s if FHR does not tolerate pitocin. Patient verbalized understanding.  Wende Mott, CNM 03/15/19 10:33 PM

## 2019-03-15 NOTE — MAU Note (Signed)
Informed Becky RN that pt is refusing covid swab, RN states that pt will now go to room 218. Christina RN notified of room update.

## 2019-03-15 NOTE — Progress Notes (Signed)
LABOR PROGRESS NOTE  Kelly [name] is a 23 y.o. G1P0 at [redacted]w[redacted]d  admitted for PPROM.  Subjective: She is doing well and resting on her left side with the peanut ball in place. She is experiencing minimal discomfort with contractions.   Objective: BP 118/80   Pulse 91   Temp 98.9 F (37.2 C) (Oral)   Resp 18   Ht 5\' 2"  (1.575 m)   Wt 62.6 kg   LMP 07/13/2018   SpO2 100%   BMI 25.24 kg/m  or  Vitals:   03/15/19 1431 03/15/19 1501 03/15/19 1531 03/15/19 1602  BP: 123/85 128/86 125/85 118/80  Pulse: 97 86 86 91  Resp: 16 18 18 18   Temp:   98.9 F (37.2 C)   TempSrc:   Oral   SpO2:      Weight:      Height:        Dilation: 5 Effacement (%): 100 Station: -2 Presentation: Vertex Exam by:: M. Bhambri,CNM FHT: baseline rate 130, moderate varibility, 15 x15 acels present, mixed variable and early decels Toco: 3-4  Labs: Lab Results  Component Value Date   WBC 5.7 03/15/2019   HGB 9.6 (L) 03/15/2019   HCT 29.4 (L) 03/15/2019   MCV 91.9 03/15/2019   PLT 375 03/15/2019    Patient Active Problem List   Diagnosis Date Noted  . Gonorrhea affecting pregnancy in third trimester 03/15/2019  . Premature rupture of membranes 03/15/2019  . Hypokalemia 02/24/2019  . Hydronephrosis of right kidney 02/24/2019  . Late Entry to Bab[employer]s- April 2020- Social Distancing  11/27/2018  . Iron deficiency anemia during pregnancy, antepartum 10/14/2018  . Supervision of normal first pregnancy, antepartum 10/01/2018  . Systemic lupus erythematosus (SLE) affecting pregnancy, antepartum ([place]) 06/04/2012    Assessment / Plan: 23 y.o. G1P0 at [redacted]w[redacted]d here for PPROM w/ SOL.  Labor: Continue cervical exams for the progression of labor. Start Pitocin.  Fetal Wellbeing: Cat I Pain Control:  Epidural in place and adequate Anticipated MOD:  Vaginal  Alegandro Macnaughton Autry-Lott, D.O. Family Medicine Resident PGY-1 03/15/2019, 4:15 PM

## 2019-03-15 NOTE — Anesthesia Preprocedure Evaluation (Signed)
Anesthesia Evaluation  Patient identified by MRN, date of birth, ID band Patient awake    Reviewed: Allergy & Precautions, NPO status , Patient's Chart, lab work & pertinent test results  Airway Mallampati: II  TM Distance: >3 FB Neck ROM: Full    Dental no notable dental hx. (+) Teeth Intact   Pulmonary asthma , former smoker,  Pt refused Covid test     Pulmonary exam normal breath sounds clear to auscultation       Cardiovascular Exercise Tolerance: Good negative cardio ROS Normal cardiovascular exam Rhythm:Regular Rate:Normal     Neuro/Psych negative neurological ROS     GI/Hepatic   Endo/Other    Renal/GU      Musculoskeletal   Abdominal   Peds  Hematology  (+) anemia , Hgb 9.8 Plt 375   Anesthesia Other Findings   Reproductive/Obstetrics (+) Pregnancy                             Anesthesia Physical Anesthesia Plan  ASA: III  Anesthesia Plan: Epidural   Post-op Pain Management:    Induction:   PONV Risk Score and Plan:   Airway Management Planned:   Additional Equipment:   Intra-op Plan:   Post-operative Plan:   Informed Consent: I have reviewed the patients History and Physical, chart, labs and discussed the procedure including the risks, benefits and alternatives for the proposed anesthesia with the patient or authorized representative who has indicated his/her understanding and acceptance.       Plan Discussed with:   Anesthesia Plan Comments: (G1p0 for LEA (Unknown Covid Status))        Anesthesia Quick Evaluation

## 2019-03-15 NOTE — Progress Notes (Signed)
Patient ID: Kelly [name], female   DOB: Sep 18, 1995, 23 y.o.   MRN: [id number]   Called to patient's room for fetal bradycardia in the setting of tachysystole. Fetal heart rate improving by the time I arrive - pit had been stopped and patient repositioned. FSE placed.  Will recover baby and restart pitocin in 15 minutes. If continues to have recurrent decelerations, will move towards cesarean delivery. In preparation, patient consented for cesarean delivery: bleeding which may require transfusion or reoperation; infection which may require antibiotics; injury to bowel, bladder, ureters or other surrounding organs; injury to the fetus; need for additional procedures including hysterectomy in the event of a life-threatening hemorrhage; placental abnormalities wth subsequent pregnancies, incisional problems, thromboembolic phenomenon and other postoperative/anesthesia complications.   Truett Mainland, DO 03/15/2019 10:25 PM

## 2019-03-16 ENCOUNTER — Encounter (HOSPITAL_COMMUNITY)
Admission: AD | Disposition: A | Discharge status: Home / Self Care | Payer: Self-pay | Source: Home / Self Care | Attending: Family Medicine

## 2019-03-16 ENCOUNTER — Encounter (HOSPITAL_COMMUNITY): Payer: Self-pay

## 2019-03-16 DIAGNOSIS — Z3A35 35 weeks gestation of pregnancy: Secondary | ICD-10-CM

## 2019-03-16 DIAGNOSIS — O42013 Preterm premature rupture of membranes, onset of labor within 24 hours of rupture, third trimester: Secondary | ICD-10-CM

## 2019-03-16 DIAGNOSIS — Z98891 History of uterine scar from previous surgery: Secondary | ICD-10-CM

## 2019-03-16 LAB — CBC
HCT: 25.1 % — ABNORMAL LOW (ref 36.0–46.0)
Hemoglobin: 8.3 g/dL — ABNORMAL LOW (ref 12.0–15.0)
MCH: 29.9 pg (ref 26.0–34.0)
MCHC: 33.1 g/dL (ref 30.0–36.0)
MCV: 90.3 fL (ref 80.0–100.0)
Platelets: 313 10*3/uL (ref 150–400)
RBC: 2.78 MIL/uL — ABNORMAL LOW (ref 3.87–5.11)
RDW: 16.7 % — ABNORMAL HIGH (ref 11.5–15.5)
WBC: 16.3 10*3/uL — ABNORMAL HIGH (ref 4.0–10.5)
nRBC: 0 % (ref 0.0–0.2)

## 2019-03-16 SURGERY — Surgical Case
Anesthesia: Epidural

## 2019-03-16 MED ORDER — SIMETHICONE 80 MG PO CHEW
80.0000 mg | CHEWABLE_TABLET | ORAL | Status: DC
  Administered 2019-03-17 – 2019-03-18 (×2): 80 mg via ORAL
  Filled 2019-03-16 (×3): qty 1

## 2019-03-16 MED ORDER — DIPHENHYDRAMINE HCL 50 MG/ML IJ SOLN
12.5000 mg | INTRAMUSCULAR | Status: DC | PRN
  Administered 2019-03-16: 12.5 mg via INTRAVENOUS
  Filled 2019-03-16: qty 1

## 2019-03-16 MED ORDER — SODIUM CHLORIDE 0.9 % IV SOLN
INTRAVENOUS | Status: AC
  Filled 2019-03-16: qty 500

## 2019-03-16 MED ORDER — NALBUPHINE HCL 10 MG/ML IJ SOLN
INTRAMUSCULAR | Status: AC
  Filled 2019-03-16: qty 1

## 2019-03-16 MED ORDER — PHENYLEPHRINE 40 MCG/ML (10ML) SYRINGE FOR IV PUSH (FOR BLOOD PRESSURE SUPPORT): 80.0000 ug | PREFILLED_SYRINGE | INTRAVENOUS | Status: DC | PRN

## 2019-03-16 MED ORDER — ONDANSETRON HCL 4 MG/2ML IJ SOLN: 4.0000 mg | Freq: Once | INTRAMUSCULAR | Status: DC | PRN

## 2019-03-16 MED ORDER — FENTANYL-BUPIVACAINE-NACL 0.5-0.125-0.9 MG/250ML-% EP SOLN: 12.0000 mL/h | EPIDURAL | Status: DC | PRN

## 2019-03-16 MED ORDER — NALBUPHINE HCL 10 MG/ML IJ SOLN
5.0000 mg | Freq: Once | INTRAMUSCULAR | Status: AC | PRN
  Administered 2019-03-16: 5 mg via INTRAVENOUS

## 2019-03-16 MED ORDER — SODIUM BICARBONATE 8.4 % IV SOLN
INTRAVENOUS | Status: DC | PRN
  Administered 2019-03-16: 5 mL via EPIDURAL

## 2019-03-16 MED ORDER — MEPERIDINE HCL 25 MG/ML IJ SOLN: 6.2500 mg | INTRAMUSCULAR | Status: DC | PRN

## 2019-03-16 MED ORDER — NALOXONE HCL 4 MG/10ML IJ SOLN
1.0000 ug/kg/h | INTRAVENOUS | Status: DC | PRN
  Filled 2019-03-16: qty 5

## 2019-03-16 MED ORDER — OXYTOCIN 40 UNITS IN NORMAL SALINE INFUSION - SIMPLE MED: 2.5000 [IU]/h | INTRAVENOUS | Status: AC

## 2019-03-16 MED ORDER — ONDANSETRON HCL 4 MG/2ML IJ SOLN: 4.0000 mg | Freq: Three times a day (TID) | INTRAMUSCULAR | Status: DC | PRN

## 2019-03-16 MED ORDER — MORPHINE SULFATE (PF) 0.5 MG/ML IJ SOLN
INTRAMUSCULAR | Status: AC
  Filled 2019-03-16: qty 10

## 2019-03-16 MED ORDER — WITCH HAZEL-GLYCERIN EX PADS
1.0000 "application " | MEDICATED_PAD | CUTANEOUS | Status: DC | PRN
  Administered 2019-03-16: 1 via TOPICAL

## 2019-03-16 MED ORDER — DEXAMETHASONE SODIUM PHOSPHATE 4 MG/ML IJ SOLN
INTRAMUSCULAR | Status: DC | PRN
  Administered 2019-03-16: 4 mg via INTRAVENOUS

## 2019-03-16 MED ORDER — SODIUM CHLORIDE 0.9% FLUSH: 3.0000 mL | INTRAVENOUS | Status: DC | PRN

## 2019-03-16 MED ORDER — ZOLPIDEM TARTRATE 5 MG PO TABS: 5.0000 mg | ORAL_TABLET | Freq: Every evening | ORAL | Status: DC | PRN

## 2019-03-16 MED ORDER — OXYCODONE HCL 5 MG/5ML PO SOLN: 5.0000 mg | Freq: Once | ORAL | Status: DC | PRN

## 2019-03-16 MED ORDER — NALBUPHINE HCL 10 MG/ML IJ SOLN: 5.0000 mg | INTRAMUSCULAR | Status: DC | PRN

## 2019-03-16 MED ORDER — KETOROLAC TROMETHAMINE 30 MG/ML IJ SOLN
INTRAMUSCULAR | Status: AC
  Filled 2019-03-16: qty 1

## 2019-03-16 MED ORDER — NALBUPHINE HCL 10 MG/ML IJ SOLN: 5.0000 mg | Freq: Once | INTRAMUSCULAR | Status: AC | PRN

## 2019-03-16 MED ORDER — KETOROLAC TROMETHAMINE 30 MG/ML IJ SOLN: 30.0000 mg | Freq: Four times a day (QID) | INTRAMUSCULAR | Status: AC | PRN

## 2019-03-16 MED ORDER — ONDANSETRON HCL 4 MG/2ML IJ SOLN
INTRAMUSCULAR | Status: AC
  Filled 2019-03-16: qty 2

## 2019-03-16 MED ORDER — ONDANSETRON HCL 4 MG/2ML IJ SOLN
INTRAMUSCULAR | Status: DC | PRN
  Administered 2019-03-16: 4 mg via INTRAVENOUS

## 2019-03-16 MED ORDER — MENTHOL 3 MG MT LOZG: 1.0000 | LOZENGE | OROMUCOSAL | Status: DC | PRN

## 2019-03-16 MED ORDER — HYDROMORPHONE HCL 1 MG/ML IJ SOLN: 0.2500 mg | INTRAMUSCULAR | Status: DC | PRN

## 2019-03-16 MED ORDER — CEFAZOLIN SODIUM-DEXTROSE 2-4 GM/100ML-% IV SOLN
2.0000 g | Freq: Once | INTRAVENOUS | Status: AC
  Administered 2019-03-16: 2 g via INTRAVENOUS

## 2019-03-16 MED ORDER — DIBUCAINE (PERIANAL) 1 % EX OINT: 1.0000 "application " | TOPICAL_OINTMENT | CUTANEOUS | Status: DC | PRN

## 2019-03-16 MED ORDER — ACETAMINOPHEN 10 MG/ML IV SOLN: 1000.0000 mg | Freq: Once | INTRAVENOUS | Status: DC | PRN

## 2019-03-16 MED ORDER — MORPHINE SULFATE (PF) 0.5 MG/ML IJ SOLN
INTRAMUSCULAR | Status: DC | PRN
  Administered 2019-03-16: 3 mg via EPIDURAL

## 2019-03-16 MED ORDER — PREDNISONE 5 MG PO TABS
5.0000 mg | ORAL_TABLET | Freq: Every day | ORAL | Status: DC
  Administered 2019-03-16 – 2019-03-19 (×4): 5 mg via ORAL
  Filled 2019-03-16 (×4): qty 1

## 2019-03-16 MED ORDER — PANTOPRAZOLE SODIUM 40 MG PO TBEC
40.0000 mg | DELAYED_RELEASE_TABLET | Freq: Every day | ORAL | Status: DC
  Administered 2019-03-16 – 2019-03-19 (×4): 40 mg via ORAL
  Filled 2019-03-16 (×4): qty 1

## 2019-03-16 MED ORDER — OXYTOCIN 40 UNITS IN NORMAL SALINE INFUSION - SIMPLE MED
INTRAVENOUS | Status: AC
  Filled 2019-03-16: qty 1000

## 2019-03-16 MED ORDER — CEFAZOLIN SODIUM-DEXTROSE 2-4 GM/100ML-% IV SOLN
INTRAVENOUS | Status: AC
  Filled 2019-03-16: qty 100

## 2019-03-16 MED ORDER — DIPHENHYDRAMINE HCL 25 MG PO CAPS: 25.0000 mg | ORAL_CAPSULE | Freq: Four times a day (QID) | ORAL | Status: DC | PRN

## 2019-03-16 MED ORDER — LACTATED RINGERS IV SOLN
INTRAVENOUS | Status: DC
  Administered 2019-03-16: 15:00:00 via INTRAVENOUS

## 2019-03-16 MED ORDER — EPHEDRINE 5 MG/ML INJ: 10.0000 mg | INTRAVENOUS | Status: DC | PRN

## 2019-03-16 MED ORDER — TETANUS-DIPHTH-ACELL PERTUSSIS 5-2.5-18.5 LF-MCG/0.5 IM SUSP: 0.5000 mL | Freq: Once | INTRAMUSCULAR | Status: DC

## 2019-03-16 MED ORDER — OXYCODONE-ACETAMINOPHEN 5-325 MG PO TABS
1.0000 | ORAL_TABLET | ORAL | Status: DC | PRN
  Administered 2019-03-17: 1 via ORAL
  Administered 2019-03-17: 2 via ORAL
  Administered 2019-03-17 (×2): 1 via ORAL
  Administered 2019-03-18 – 2019-03-19 (×4): 2 via ORAL
  Filled 2019-03-16: qty 2
  Filled 2019-03-16 (×2): qty 1
  Filled 2019-03-16 (×3): qty 2
  Filled 2019-03-16: qty 1
  Filled 2019-03-16: qty 2

## 2019-03-16 MED ORDER — SIMETHICONE 80 MG PO CHEW
80.0000 mg | CHEWABLE_TABLET | ORAL | Status: DC | PRN
  Administered 2019-03-17: 80 mg via ORAL
  Filled 2019-03-16: qty 1

## 2019-03-16 MED ORDER — PRENATAL 27-0.8 MG PO TABS: 1.0000 | ORAL_TABLET | Freq: Every day | ORAL | Status: DC

## 2019-03-16 MED ORDER — LACTATED RINGERS IV SOLN: 500.0000 mL | Freq: Once | INTRAVENOUS | Status: DC

## 2019-03-16 MED ORDER — NALOXONE HCL 0.4 MG/ML IJ SOLN: 0.4000 mg | INTRAMUSCULAR | Status: DC | PRN

## 2019-03-16 MED ORDER — PRENATAL MULTIVITAMIN CH
1.0000 | ORAL_TABLET | Freq: Every day | ORAL | Status: DC
  Administered 2019-03-16 – 2019-03-19 (×4): 1 via ORAL
  Filled 2019-03-16 (×4): qty 1

## 2019-03-16 MED ORDER — HYDROXYCHLOROQUINE SULFATE 200 MG PO TABS
300.0000 mg | ORAL_TABLET | Freq: Every day | ORAL | Status: DC
  Administered 2019-03-16 – 2019-03-19 (×4): 300 mg via ORAL
  Filled 2019-03-16 (×4): qty 1.5

## 2019-03-16 MED ORDER — SODIUM CHLORIDE 0.9 % IV SOLN
INTRAVENOUS | Status: DC | PRN
  Administered 2019-03-16: 02:00:00 via INTRAVENOUS

## 2019-03-16 MED ORDER — DIPHENHYDRAMINE HCL 25 MG PO CAPS: 25.0000 mg | ORAL_CAPSULE | ORAL | Status: DC | PRN

## 2019-03-16 MED ORDER — COCONUT OIL OIL: 1.0000 "application " | TOPICAL_OIL | Status: DC | PRN

## 2019-03-16 MED ORDER — SCOPOLAMINE 1 MG/3DAYS TD PT72: 1.0000 | MEDICATED_PATCH | Freq: Once | TRANSDERMAL | Status: DC

## 2019-03-16 MED ORDER — DEXAMETHASONE SODIUM PHOSPHATE 4 MG/ML IJ SOLN
INTRAMUSCULAR | Status: AC
  Filled 2019-03-16: qty 1

## 2019-03-16 MED ORDER — SODIUM CHLORIDE 0.9 % IV SOLN
500.0000 mg | Freq: Once | INTRAVENOUS | Status: AC
  Administered 2019-03-16: 250 mg via INTRAVENOUS

## 2019-03-16 MED ORDER — SODIUM CHLORIDE 0.9 % IR SOLN
Status: DC | PRN
  Administered 2019-03-16: 1000 mL

## 2019-03-16 MED ORDER — FERROUS SULFATE 325 (65 FE) MG PO TABS
325.0000 mg | ORAL_TABLET | Freq: Two times a day (BID) | ORAL | Status: DC
  Administered 2019-03-16 – 2019-03-19 (×6): 325 mg via ORAL
  Filled 2019-03-16 (×6): qty 1

## 2019-03-16 MED ORDER — FENTANYL CITRATE (PF) 100 MCG/2ML IJ SOLN
INTRAMUSCULAR | Status: DC | PRN
  Administered 2019-03-16: 100 ug via EPIDURAL

## 2019-03-16 MED ORDER — ALBUTEROL SULFATE (2.5 MG/3ML) 0.083% IN NEBU: 3.0000 mL | INHALATION_SOLUTION | Freq: Four times a day (QID) | RESPIRATORY_TRACT | Status: DC | PRN

## 2019-03-16 MED ORDER — KETOROLAC TROMETHAMINE 30 MG/ML IJ SOLN
30.0000 mg | Freq: Four times a day (QID) | INTRAMUSCULAR | Status: AC | PRN
  Administered 2019-03-16 (×2): 30 mg via INTRAVENOUS
  Filled 2019-03-16: qty 1

## 2019-03-16 MED ORDER — LACTATED RINGERS IV SOLN
INTRAVENOUS | Status: DC | PRN
  Administered 2019-03-16: 01:00:00 via INTRAVENOUS

## 2019-03-16 MED ORDER — DIPHENHYDRAMINE HCL 50 MG/ML IJ SOLN: 12.5000 mg | INTRAMUSCULAR | Status: DC | PRN

## 2019-03-16 MED ORDER — OXYCODONE HCL 5 MG PO TABS: 5.0000 mg | ORAL_TABLET | Freq: Once | ORAL | Status: DC | PRN

## 2019-03-16 MED ORDER — SIMETHICONE 80 MG PO CHEW
80.0000 mg | CHEWABLE_TABLET | Freq: Three times a day (TID) | ORAL | Status: DC
  Administered 2019-03-16 – 2019-03-18 (×8): 80 mg via ORAL
  Filled 2019-03-16 (×8): qty 1

## 2019-03-16 MED ORDER — SENNOSIDES-DOCUSATE SODIUM 8.6-50 MG PO TABS
2.0000 | ORAL_TABLET | ORAL | Status: DC
  Filled 2019-03-16 (×2): qty 2

## 2019-03-16 MED ORDER — FENTANYL CITRATE (PF) 100 MCG/2ML IJ SOLN
INTRAMUSCULAR | Status: AC
  Filled 2019-03-16: qty 2

## 2019-03-16 SURGICAL SUPPLY — 32 items
BENZOIN TINCTURE PRP APPL 2/3 (GAUZE/BANDAGES/DRESSINGS) ×2 IMPLANT
CHLORAPREP W/TINT 26ML (MISCELLANEOUS) ×2 IMPLANT
CLAMP CORD UMBIL (MISCELLANEOUS) IMPLANT
CLOTH BEACON ORANGE TIMEOUT ST (SAFETY) ×2 IMPLANT
CLSR STERI-STRIP ANTIMIC 1/2X4 (GAUZE/BANDAGES/DRESSINGS) ×1 IMPLANT
DRSG OPSITE POSTOP 4X10 (GAUZE/BANDAGES/DRESSINGS) ×2 IMPLANT
ELECT REM PT RETURN 9FT ADLT (ELECTROSURGICAL) ×2
ELECTRODE REM PT RTRN 9FT ADLT (ELECTROSURGICAL) ×1 IMPLANT
EXTRACTOR VACUUM M CUP 4 TUBE (SUCTIONS) IMPLANT
GLOVE BIOGEL PI IND STRL 7.0 (GLOVE) ×2 IMPLANT
GLOVE BIOGEL PI IND STRL 7.5 (GLOVE) ×2 IMPLANT
GLOVE BIOGEL PI INDICATOR 7.0 (GLOVE) ×2
GLOVE BIOGEL PI INDICATOR 7.5 (GLOVE) ×2
GLOVE ECLIPSE 7.5 STRL STRAW (GLOVE) ×2 IMPLANT
GOWN STRL REUS W/TWL LRG LVL3 (GOWN DISPOSABLE) ×6 IMPLANT
KIT ABG SYR 3ML LUER SLIP (SYRINGE) IMPLANT
NDL HYPO 25X5/8 SAFETYGLIDE (NEEDLE) IMPLANT
NEEDLE HYPO 25X5/8 SAFETYGLIDE (NEEDLE) IMPLANT
NS IRRIG 1000ML POUR BTL (IV SOLUTION) ×2 IMPLANT
PACK C SECTION WH (CUSTOM PROCEDURE TRAY) ×2 IMPLANT
PAD OB MATERNITY 4.3X12.25 (PERSONAL CARE ITEMS) ×2 IMPLANT
PENCIL SMOKE EVAC W/HOLSTER (ELECTROSURGICAL) ×2 IMPLANT
RTRCTR C-SECT PINK 25CM LRG (MISCELLANEOUS) ×2 IMPLANT
STRIP CLOSURE SKIN 1/2X4 (GAUZE/BANDAGES/DRESSINGS) ×2 IMPLANT
SUT VIC AB 0 CTX 36 (SUTURE) ×3
SUT VIC AB 0 CTX36XBRD ANBCTRL (SUTURE) ×3 IMPLANT
SUT VIC AB 2-0 CT1 27 (SUTURE) ×1
SUT VIC AB 2-0 CT1 TAPERPNT 27 (SUTURE) ×1 IMPLANT
SUT VIC AB 4-0 KS 27 (SUTURE) ×2 IMPLANT
TOWEL OR 17X24 6PK STRL BLUE (TOWEL DISPOSABLE) ×2 IMPLANT
TRAY FOLEY W/BAG SLVR 14FR LF (SET/KITS/TRAYS/PACK) ×2 IMPLANT
WATER STERILE IRR 1000ML POUR (IV SOLUTION) ×2 IMPLANT

## 2019-03-16 NOTE — Op Note (Signed)
[facility] PROCEDURE DATE: 03/16/2019  PREOPERATIVE DIAGNOSIS: Intrauterine pregnancy at  [redacted]w[redacted]d weeks gestation; non-reassuring fetal status  POSTOPERATIVE DIAGNOSIS: The same  PROCEDURE: Primary Low Transverse Cesarean Section  SURGEON:  Dr. Loma Boston  ASSISTANT:   INDICATIONS: Kelly [name] is a 23 y.o. G1P0 at [redacted]w[redacted]d scheduled for cesarean section secondary to non-reassuring fetal status.  The risks of cesarean section discussed with the patient included but were not limited to: bleeding which may require transfusion or reoperation; infection which may require antibiotics; injury to bowel, bladder, ureters or other surrounding organs; injury to the fetus; need for additional procedures including hysterectomy in the event of a life-threatening hemorrhage; placental abnormalities wth subsequent pregnancies, incisional problems, thromboembolic phenomenon and other postoperative/anesthesia complications. The patient concurred with the proposed plan, giving informed written consent for the procedure.    FINDINGS:  Viable female infant in vertex presentation.  Apgars 8 and 9, weight pending.  Clear amniotic fluid.  Intact placenta, three vessel cord.  Normal uterus, fallopian tubes and ovaries bilaterally.  ANESTHESIA:    Spinal INTRAVENOUS FLUIDS: 1200 ml QUANTITATIVE BLOOD LOSS: 171 ml URINE OUTPUT:  200 ml SPECIMENS: Placenta sent to pathology COMPLICATIONS: None immediate  PROCEDURE IN DETAIL:  The patient received intravenous antibiotics and had sequential compression devices applied to her lower extremities while in the preoperative area.  She was then taken to the operating room where epidural anesthesia was dosed up to surgical level and was found to be adequate. She was then placed in a dorsal supine position with a leftward tilt, and prepped and draped in a sterile manner.  A foley catheter was placed into her bladder and attached to constant gravity, which drained clear fluid  throughout.  After an adequate timeout was performed, a Pfannenstiel skin incision was made with scalpel and carried through to the underlying layer of fascia. The fascia was incised in the midline and this incision was extended bilaterally using the Mayo scissors. Kocher clamps were applied to the superior aspect of the fascial incision and the underlying rectus muscles were dissected off bluntly. A similar process was carried out on the inferior aspect of the facial incision. The rectus muscles were separated in the midline bluntly and the peritoneum was entered bluntly. An Alexis retractor was placed to aid in visualization of the uterus.  Attention was turned to the lower uterine segment where a transverse hysterotomy was made with a scalpel and extended bilaterally bluntly. The infant was successfully delivered, and cord was clamped and cut and infant was handed over to awaiting neonatology team. Uterine massage was then administered and the placenta delivered intact with three-vessel cord. The uterus was then cleared of clot and debris.  The hysterotomy was closed with 0 Vicryl in a running locked fashion, and an imbricating layer was placed with the same suture. Overall, excellent hemostasis was noted. The abdomen and the pelvis were cleared of all clot and debris and the Ubaldo Glassing was removed. Hemostasis was confirmed on all surfaces.  The peritoneum was reapproximated using 2-0 vicryl running stitches. The fascia was then closed using 0 Vicryl in a running fashion. The skin was closed with 4-0 vicryl. The patient tolerated the procedure well. Sponge, lap, instrument and needle counts were correct x 2. She was taken to the recovery room in stable condition.    Truett Mainland, DO 03/16/2019 2:31 AM

## 2019-03-16 NOTE — Lactation Note (Signed)
This note was copied from a baby's chart. Lactation Consultation Note  Patient Name: Kelly [name] OIBBC'W Date: 03/16/2019 Reason for consult: Initial assessment;Late-preterm 34-36.6wks;Primapara;1st time breastfeeding;Infant < 6lbs  P1 mother whose infant is now 71 hours old.  This is a LPTI at 35+1 weeks weighing < 6 lbs.    Baby was asleep in the bassinet when I arrived.  Reviewed the LPTI policy in detail with mother.  Stressed the importance of waking baby every three hours to feed and supplementing with the appropriate volume.  Guidelines provided.  RN has initiated the DEBP for mother.  She will feed baby and post pump for 15 minutes.  Educated mother on milk storage times and to always feed back any EBM she obtains from pumping first, followed by Sim 22 calorie formula.  Mother verbalized understanding.  Total feeding time will not exceed 30 minutes.  Reviewed feeding cues with mother, reminding her that baby may not show feeding cues due to his gestational age.  Mother will call for latch assistance or feeding help as needed.  Encouraged hand expression before/after feedings to enhance milk supply.  Mother is using the curved tip syringe for supplementing; will continue to monitor to determine if a bottle may be better for her if she or baby has difficulty with the syringe.  Per RN, baby does not suck well at all.  Mom made aware of O/P services, breastfeeding support groups, community resources, and our phone # for post-discharge questions. Mother has a manual and a DEBP for home use.  RN updated.   Maternal Data Formula Feeding for Exclusion: No Has patient been taught Hand Expression?: Yes Does the patient have breastfeeding experience prior to this delivery?: No  Feeding Feeding Type: Formula  LATCH Score                   Interventions    Lactation Tools Discussed/Used Pump Review: Setup, frequency, and cleaning(reviewed) Initiated by:: Demani Weyrauch   Consult Status Consult Status: Follow-up Date: 03/17/19 Follow-up type: In-patient    Kelly [name] 03/16/2019, 11:54 AM

## 2019-03-16 NOTE — Progress Notes (Signed)
[name] was referred for history of depression/anxiety. * Referral screened out by Clinical Social Worker because none of the following criteria appear to apply: ~ History of anxiety/depression during this pregnancy, or of post-partum depression following prior delivery. ~ Diagnosis of anxiety and/or depression within last 3 years. Per [name]'s chart review, [name] appears to to have been diagnosed with anxiety in 2017. OR * [name]'s symptoms currently being treated with medication and/or therapy.    Please contact the Clinical Social Worker if needs arise, by [name] request, or if [name] scores greater than 9/yes to question 10 on Edinburgh Postpartum Depression Screen.       Kelly [name] S. Kelly [name], MSW, LCSW [facility] [phone]  

## 2019-03-16 NOTE — Progress Notes (Signed)
Dr. Nehemiah Settle at bedside discussing risks/benefits or proceeding with cesarean delivery due to fetal heart rate intolerance. Patient verbalized understanding of plan of care and informed consent obtained. OR and ANMD notified.

## 2019-03-16 NOTE — Anesthesia Postprocedure Evaluation (Signed)
Anesthesia Post Note  Patient: Kelly [name]  Procedure(s) Performed: CESAREAN SECTION (N/A )     Patient location during evaluation: Mother Baby Anesthesia Type: Epidural Level of consciousness: oriented and awake and alert Pain management: pain level controlled Vital Signs Assessment: post-procedure vital signs reviewed and stable Respiratory status: spontaneous breathing and respiratory function stable Cardiovascular status: blood pressure returned to baseline and stable Postop Assessment: no headache, no backache, no apparent nausea or vomiting and able to ambulate Anesthetic complications: no    Last Vitals:  Vitals:   03/16/19 0518 03/16/19 0623  BP: 138/81 111/73  Pulse: (!) 106 89  Resp: 20 20  Temp: 36.7 C 36.9 C  SpO2: 100% 100%    Last Pain:  Vitals:   03/16/19 0623  TempSrc: Oral  PainSc:    Pain Goal:                   Barnet Glasgow

## 2019-03-16 NOTE — Transfer of Care (Signed)
Immediate Anesthesia Transfer of Care Note  Patient: Kelly [name]  Procedure(s) Performed: CESAREAN SECTION (N/A )  Patient Location: PACU  Anesthesia Type:Epidural  Level of Consciousness: awake and alert   Airway & Oxygen Therapy: Patient Spontanous Breathing  Post-op Assessment: Report given to RN and Post -op Vital signs reviewed and stable  Post vital signs: Reviewed  Last Vitals:  Vitals Value Taken Time  BP 115/82 03/16/19 0237  Temp 36.5 C 03/16/19 0237  Pulse 117 03/16/19 0242  Resp 21 03/16/19 0242  SpO2 100 % 03/16/19 0242  Vitals shown include unvalidated device data.  Last Pain:  Vitals:   03/16/19 0237  TempSrc: Oral  PainSc:          Complications: No apparent anesthesia complications

## 2019-03-16 NOTE — Progress Notes (Signed)
Patient ID: Kelly [name], female   DOB: 1995/11/16, 23 y.o.   MRN: [id number]  Called to patient's room due to fetal bradycardia. No progress on pitocin. Third fetal bradycardia. Pitocin stopped, terbutaline given. WIll proceed with cesarean delivery as no change in cervical exam.  Ancef, azithromycin. Truett Mainland, DO

## 2019-03-16 NOTE — Progress Notes (Signed)
Patient having fecal incontinence. She is aware she is passing stool but can't control it. Blood pressure 135/93. No complaints of PIH S&S. Notified Dr. Janus Molder.

## 2019-03-16 NOTE — Discharge Summary (Addendum)
Postpartum Discharge Summary     Patient Name: Kelly [name] DOB: [birth year] MRN: [id number]  Date of admission: 03/15/2019 Delivering Provider: Levie HeritageSTINSON, JACOB J   Date of discharge: 03/19/2019  Admitting diagnosis:  water broke,contractions Intrauterine pregnancy: 3060w1d     Secondary diagnosis:  Active Problems:   Systemic lupus erythematosus (SLE) affecting pregnancy, antepartum ([facility])   Iron deficiency anemia during pregnancy, antepartum   Gonorrhea affecting pregnancy in third trimester   Premature rupture of membranes   Status post cesarean section   Mild preeclampsia  Additional problems: HTN postpartum     Discharge diagnosis: Preterm Pregnancy Delivered                                                                                                Post partum procedures:none  Augmentation: Pitocin  Complications: None  Hospital course:  Induction of Labor With Cesarean Section  23 y.o. yo G1P0 at 1660w1d was admitted to the hospital 03/15/2019 for induction of labor due to PROM. Patient had a labor course significant for recurrent fetal decelerations and bradycardic episodes. The patient went for cesarean section due to Non-Reassuring FHR, and delivered a Viable infant,03/16/2019  Membrane Rupture Time/Date: 6:34 AM ,03/15/2019   Details of operation can be found in separate operative Note.  Patient had an uncomplicated postpartum course. She is ambulating, tolerating a regular diet, passing flatus, and urinating well. Patient has had multiple diastolic BP in the 90s, therefore it was determined to begin anti-HTN medication and patient will follow up for BP check in the office. Medication will be determined to be continued postpartum.  Patient is discharged home in stable condition on 03/19/19.                                    Magnesium Sulfate recieved: No BMZ received: Yes  Physical exam  Vitals:   03/17/19 2128 03/18/19 0500 03/18/19 2131 03/19/19 0510  BP: (!)  113/94 123/83 109/78 (!) 128/92  Pulse: (!) 102 (!) 108 98 89  Resp: 20 18 18 16   Temp: 100.1 F (37.8 C) 99.9 F (37.7 C) 98.3 F (36.8 C) 99.2 F (37.3 C)  TempSrc: Oral Oral Oral Oral  SpO2:  100% 100%   Weight:      Height:       General: alert, cooperative and no distress Lochia: appropriate Uterine Fundus: firm Incision: Healing well with no significant drainage, No significant erythema, Dressing is clean, dry, and intact DVT Evaluation: No evidence of DVT seen on physical exam. Negative Homan's sign. No cords or calf tenderness. No significant calf/ankle edema. Labs: Lab Results  Component Value Date   WBC 16.3 (H) 03/16/2019   HGB 8.3 (L) 03/16/2019   HCT 25.1 (L) 03/16/2019   MCV 90.3 03/16/2019   PLT 313 03/16/2019   CMP Latest Ref Rng & Units 03/15/2019  Glucose 70 - 99 mg/dL 78(G66(L)  BUN 6 - 20 mg/dL 6  Creatinine 9.560.44 - 2.131.00 mg/dL 0.860.61  Sodium 578135 - 469145 mmol/L 135  Potassium 3.5 - 5.1 mmol/L 3.5  Chloride 98 - 111 mmol/L 108  CO2 22 - 32 mmol/L 20(L)  Calcium 8.9 - 10.3 mg/dL 8.0(L)  Total Protein 6.5 - 8.1 g/dL 6.7  Total Bilirubin 0.3 - 1.2 mg/dL 0.5  Alkaline Phos 38 - 126 U/L 123  AST 15 - 41 U/L 62(H)  ALT 0 - 44 U/L 29    Discharge instruction: per After Visit Summary and "Baby and Me Booklet".  After visit meds:  Allergies as of 03/19/2019   No Known Allergies     Medication List    STOP taking these medications   multivitamin-prenatal 27-0.8 MG Tabs tablet   potassium chloride 10 MEQ tablet Commonly known as: K-DUR   terconazole 0.4 % vaginal cream Commonly known as: Terazol 7     TAKE these medications   acetaminophen 500 MG tablet Commonly known as: TYLENOL Take 1,000 mg by mouth every 6 (six) hours as needed for moderate pain or headache.   aspirin 81 MG chewable tablet Chew by mouth daily.   cyclobenzaprine 10 MG tablet Commonly known as: FLEXERIL cyclobenzaprine 10 mg tablet  1/2-1 TABLET AS NEEDED THREE TIMES A DAY  ORALLY 30 DAYS   enalapril 5 MG tablet Commonly known as: Vasotec Take 1 tablet (5 mg total) by mouth daily.   ferrous sulfate 325 (65 FE) MG tablet Take 1 tablet (325 mg total) by mouth 2 (two) times daily with a meal.   oxyCODONE-acetaminophen 5-325 MG tablet Commonly known as: PERCOCET/ROXICET Take 1-2 tablets by mouth every 4 (four) hours as needed for moderate pain.   polyvinyl alcohol 1.4 % ophthalmic solution Commonly known as: LIQUIFILM TEARS Place 1 drop into both eyes 3 (three) times daily as needed for dry eyes.   predniSONE 5 MG tablet Commonly known as: DELTASONE Take 5 mg by mouth daily.   PRILOSEC PO Take by mouth.   Ventolin HFA 108 (90 Base) MCG/ACT inhaler Generic drug: albuterol Ventolin HFA 90 mcg/actuation aerosol inhaler  INHALE 1 TO 2 PUFFS INTO THE LUNGS EVERY 6 HOURS AS NEEDED FOR WHEEZING OR SHORTNESS OF BREATH   albuterol 108 (90 Base) MCG/ACT inhaler Commonly known as: VENTOLIN HFA INHALE 1-2 PUFFS INTO THE LUNGS EVERY 6 (SIX) HOURS AS NEEDED FOR WHEEZING OR SHORTNESS OF BREATH.   Vitafol FE+ 90-1-200 & 50 MG Cppk Take 1 tablet by mouth daily.            Discharge Care Instructions  (From admission, onward)         Start     Ordered   03/19/19 0000  Discharge wound care:    Comments: As per discharge handout and nursing instructions   03/19/19 1016          Diet: routine diet  Activity: Advance as tolerated. Pelvic rest for 6 weeks.   Outpatient follow up:2 weeks Follow up Appt: Future Appointments  Date Time Provider Department Center  03/31/2019  2:30 PM Hermina StaggersErvin, Michael L, MD [facility] None  04/16/2019 10:30 AM Sharyon Cableogers, Veronica C, CNM [facility] None   Follow up Visit: Follow-up Information    [facility].   Specialty: Obstetrics and Gynecology Why: Follow up as scheduled for postpartum care Contact information: [address], [address] [place]  [id number] [phone]           Please schedule this patient for Postpartum visit in: 4 weeks with the following provider: Any provider For C/S patients schedule  nurse incision check in weeks 2 weeks: yes; BP check also in 1-2 weeks. High risk pregnancy complicated by: lupus Delivery mode:  CS Anticipated Birth Control:  Condoms, discussed options and given ACEi for HTN control and concern for teratogenicity, patient still elects for condom use. PP Procedures needed: Incision check , BP check Schedule Integrated BH visit: no   Newborn Data: Live born female  Birth Weight:  2384g (5lb 4.1oz) APGAR: 8, 9  Newborn Delivery   Birth date/time: 03/16/2019 01:54:00 Delivery type: C-Section, Low Transverse Trial of labor: Yes C-section categorization: Primary      Baby Feeding: Breast Disposition:home with mother   03/19/2019 Katherine Basset, DO

## 2019-03-17 DIAGNOSIS — O1400 Mild to moderate pre-eclampsia, unspecified trimester: Secondary | ICD-10-CM | POA: Clinically undetermined

## 2019-03-17 LAB — RPR: RPR Ser Ql: NONREACTIVE

## 2019-03-17 LAB — CULTURE, BETA STREP (GROUP B ONLY)

## 2019-03-17 NOTE — Progress Notes (Signed)
Subjective: Postpartum Day #1: Cesarean Delivery Patient reports tolerating PO and no problems voiding. Ambulating without difficulty; already has bid po iron ordered. Pumping due to latching difficulties with preterm infant. Declines contraception other than condoms. Had c/o incontinence of sm amts of stool yesterday- much improved, but still having some loose stools.  Objective: Vital signs in last 24 hours: Temp:  [98.1 F (36.7 C)-99.2 F (37.3 C)] 99.2 F (37.3 C) (07/21 0500) Pulse Rate:  [76-97] 97 (07/20 1816) Resp:  [18-20] 18 (07/21 0500) BP: (126-152)/(84-97) 136/95 (07/21 0500) SpO2:  [99 %-100 %] 100 % (07/21 0500)  Physical Exam:  General: alert, cooperative and no distress Lochia: appropriate Uterine Fundus: firm Incision: pressure dsg loosely on, honeycomb dry and intact DVT Evaluation: No evidence of DVT seen on physical exam.  Recent Labs    03/15/19 0817 03/16/19 0507  HGB 9.6* 8.3*  HCT 29.4* 25.1*    Assessment/Plan: -Status post Cesarean section -Anemia   Doing well postoperatively.  Continue current care. Anticipate d/c on 03/19/19 due to preterm infant. [facility] consult ordered. Watch BPs- meds not indicated at this time.  Myrtis Ser CNM 03/17/2019, 11:51 AM

## 2019-03-17 NOTE — Plan of Care (Signed)
  Problem: Role Relationship: Goal: Ability to demonstrate positive interaction with newborn will improve Outcome: Progressing   Problem: Skin Integrity: Goal: Demonstration of wound healing without infection will improve Outcome: Progressing   Problem: Education: Goal: Knowledge of condition will improve Outcome: Progressing   Problem: Activity: Goal: Will verbalize the importance of balancing activity with adequate rest periods Outcome: Progressing Goal: Ability to tolerate increased activity will improve Outcome: Progressing Note: Pt ambulates in room without difficulty.

## 2019-03-17 NOTE — Lactation Note (Signed)
This note was copied from a baby's chart. Lactation Consultation Note  Patient Name: Boy Delyla Sandeen [id number] Date: 03/17/2019 Reason for consult: Follow-up assessment;Late-preterm 34-36.6wks;Infant < 6lbs Baby is 33 hours old/7% weight loss.  Baby has only been taking 5 mls by curved tip syringe.  RN initiated a bottle this morning with extra slow flow purple nipple.  Baby took 11 mls but tired easily.  Mom states she has attempted to latch baby but baby has not latched.  Reassured that breastfeeding should improve as baby reaches term.  Mom is pumping every 3 hours but no colostrum expressed.  Offered breastfeeding assist when baby starts to show cues and encouraged to call prn.  Maternal Data    Feeding Feeding Type: Bottle Fed - Formula Nipple Type: Extra Slow Flow  LATCH Score                   Interventions    Lactation Tools Discussed/Used     Consult Status Consult Status: Follow-up Date: 03/18/19 Follow-up type: In-patient    Ave Filter 03/17/2019, 11:45 AM

## 2019-03-18 ENCOUNTER — Telehealth: Payer: Self-pay

## 2019-03-18 MED ORDER — OXYCODONE-ACETAMINOPHEN 5-325 MG PO TABS: 1.0000 | ORAL_TABLET | ORAL | 0 refills | Status: DC | PRN

## 2019-03-18 NOTE — Lactation Note (Addendum)
This note was copied from a baby's chart. Lactation Consultation Note  Patient Name: Boy Pihu Basil [id number] Date: 03/18/2019   Baby 19 hours old.  [redacted]w[redacted]d. Mother recently pumped approx 3 ml with her personal manual pump. Mother states yesterday she pumped with DEBP and did not receive any volume. Provided education on pumping and how breastmilk comes to volume. At first, pumping is for stimulation. Encouraged mother to pump q 3 hours with DEBP or manual if she prefers for 15-20 min both breasts. Reviewed milk storage and finger syringe feeding since volume is small. Mother states she has been trying breastfeeding but baby is sleepy at the breast and only holds nipple in his mouth.   Suggest allowing baby to nuzzle but continue to supplement with formula with slow flow nipple and any breastmilk.   Parents aware of volume guidelines and have personal DEBP at home.        Maternal Data    Feeding Feeding Type: Formula Nipple Type: Dr. Myra Gianotti [facility]  [facility] Score                   Interventions    Lactation Tools Discussed/Used     Consult Status      Vivianne Master [facility] 03/18/2019, 2:42 PM

## 2019-03-18 NOTE — Progress Notes (Signed)
Subjective: Postpartum Day 2: Cesarean Delivery Patient seen this morning by Darrol Poke, CNM. Patient was to be discharged but infant staying so would prefer to be discharged tomorrow.   Objective: Vital signs in last 24 hours: Temp:  [98.8 F (37.1 C)-100.1 F (37.8 C)] 99.9 F (37.7 C) (07/22 0500) Pulse Rate:  [102-108] 108 (07/22 0500) Resp:  [18-20] 18 (07/22 0500) BP: (113-128)/(83-94) 123/83 (07/22 0500) SpO2:  [100 %] 100 % (07/22 0500)  Physical Exam: per Darrol Poke, CNM General: alert, cooperative and no distress Lochia: appropriate Uterine Fundus: firm Incision: Healing well with no significant drainage, No significant erythema, Dressing is clean, dry, and intact DVT Evaluation: No evidence of DVT seen on physical exam. Negative Homan's sign. No cords or calf tenderness. No significant calf/ankle edema.  Recent Labs    03/16/19 0507  HGB 8.3*  HCT 25.1*    Assessment/Plan: Status post Cesarean section. Doing well postoperatively.  Continue current care. Plan to discharge home tomorrow   [name] 03/18/2019, 2:05 PM

## 2019-03-18 NOTE — Telephone Encounter (Signed)
Returned call and advised that FMLA documents were faxed today.

## 2019-03-18 NOTE — Progress Notes (Signed)
POSTPARTUM PROGRESS NOTE  POD #2  Subjective:  Kelly [name] is a 23 y.o. G1P0101 s/p pLTCS at [redacted]w[redacted]d.  She reports she doing well. No acute events overnight. She reports she is doing well. She denies any problems with ambulating, voiding or po intake. Denies nausea or vomiting. She has has passed flatus. Pain is well controlled.  Lochia has decreased and.  Objective: Blood pressure 123/83, pulse (!) 108, temperature 99.9 F (37.7 C), temperature source Oral, resp. rate 18, height 5\' 2"  (1.575 m), weight 62.6 kg, last menstrual period 07/13/2018, SpO2 100 %, unknown if currently breastfeeding.  Physical Exam:  General: alert, cooperative and no distress Chest: no respiratory distress Heart: distal pulses intact Abdomen: soft, nontender,  Uterine Fundus: firm, appropriately tender DVT Evaluation: No calf swelling or tenderness Extremities: No edema Skin: warm, dry; incision clean/dry/intact w/ honeycomb dressing in place  Recent Labs    03/15/19 0817 03/16/19 0507  HGB 9.6* 8.3*  HCT 29.4* 25.1*    Assessment/Plan: Kelly [name] is a 23 y.o. G1P0101 s/p pLTCS at [redacted]w[redacted]d for NRFHT.  POD# 2- Doing welll; pain well controlled. H/H appropriate  Routine postpartum care  OOB, ambulated  Lovenox for VTE prophylaxis Anemia: asymptomatic Start po ferrous sulfate BID Contraception: Condoms Feeding: Breast/Bottle  Dispo: Plan for discharge 03/19/2019.   LOS: 3 days   Gerlene Fee, D.O. Family Medicine Resident, PGY-1  03/18/2019, 7:28 AM

## 2019-03-19 MED ORDER — ENALAPRIL MALEATE 5 MG PO TABS: 5.0000 mg | ORAL_TABLET | Freq: Every day | ORAL | 0 refills | Status: DC

## 2019-03-19 NOTE — Lactation Note (Signed)
This note was copied from a baby's chart. Lactation Consultation Note  Patient Name: Kelly [name] [id number] Date: 03/19/2019 Reason for consult: Follow-up assessment;Late-preterm 34-36.6wks;Infant < 6lbs Baby is 82 hours/4% weight loss.  Mom's breasts are full but not engorged.  Mom recently pumped 5 mls.  Mom continues to nuzzle baby at breast.  Discussed prevention and treatment of engorgement.  Mom has a DEBP for home use.  Instructed to continue nuzzling at breast and pumping every 2-3 hours.  Recommended an outpatient appointment in about 2 weeks for feeding assist and plan.  Reviewed outpatient services.  Maternal Data    Feeding    LATCH Score                   Interventions    Lactation Tools Discussed/Used     Consult Status Consult Status: Complete Follow-up type: Call as needed    Ave Filter 03/19/2019, 12:50 PM

## 2019-03-19 NOTE — Progress Notes (Signed)
AVS printed and given to Pt. Pt has no further questions or concerns. Pt is aware to call for Postpartum follow up appointment.

## 2019-03-24 ENCOUNTER — Encounter (HOSPITAL_COMMUNITY): Payer: Self-pay

## 2019-03-24 ENCOUNTER — Ambulatory Visit (HOSPITAL_COMMUNITY): Payer: BC Managed Care – PPO

## 2019-03-26 ENCOUNTER — Encounter: Payer: Medicaid Other | Admitting: Certified Nurse Midwife

## 2019-03-27 ENCOUNTER — Other Ambulatory Visit: Payer: Self-pay

## 2019-03-27 DIAGNOSIS — Z20822 Contact with and (suspected) exposure to covid-19: Secondary | ICD-10-CM

## 2019-03-29 LAB — NOVEL CORONAVIRUS, NAA: SARS-CoV-2, NAA: NOT DETECTED

## 2019-03-31 ENCOUNTER — Ambulatory Visit (INDEPENDENT_AMBULATORY_CARE_PROVIDER_SITE_OTHER): Payer: BC Managed Care – PPO | Admitting: Obstetrics and Gynecology

## 2019-03-31 ENCOUNTER — Other Ambulatory Visit (HOSPITAL_COMMUNITY)
Admission: RE | Admit: 2019-03-31 | Discharge: 2019-03-31 | Disposition: A | Discharge status: Home / Self Care | Payer: BC Managed Care – PPO | Source: Ambulatory Visit | Attending: Obstetrics and Gynecology | Admitting: Obstetrics and Gynecology

## 2019-03-31 ENCOUNTER — Other Ambulatory Visit: Payer: Self-pay

## 2019-03-31 ENCOUNTER — Encounter: Payer: Self-pay | Admitting: Obstetrics and Gynecology

## 2019-03-31 VITALS — BP 112/80 | HR 118 | Temp 98.1°F | Ht 62.0 in | Wt 109.2 lb

## 2019-03-31 DIAGNOSIS — Z98891 History of uterine scar from previous surgery: Secondary | ICD-10-CM

## 2019-03-31 DIAGNOSIS — O98213 Gonorrhea complicating pregnancy, third trimester: Secondary | ICD-10-CM

## 2019-03-31 DIAGNOSIS — Z8759 Personal history of other complications of pregnancy, childbirth and the puerperium: Secondary | ICD-10-CM | POA: Insufficient documentation

## 2019-03-31 DIAGNOSIS — O1495 Unspecified pre-eclampsia, complicating the puerperium: Secondary | ICD-10-CM | POA: Insufficient documentation

## 2019-03-31 NOTE — Progress Notes (Signed)
Ms Kelly [name] presents for incision and BP check PROM at 35 weeks C section on 03/16/19 d/t NRFHT PP PEC, started on Vasotec Denies HA or visual changes Appetite improving. Pain controlled  PE AF  VSS Lungs clear Heart RRR Abd soft + BS incision healing well  A/P Post c section incision check        PP PEC, BP check        H/O Gon  Doing well. Continue with Vasotec. TOC for Gon today F/U 04/16/19 for PP visit

## 2019-03-31 NOTE — Progress Notes (Signed)
Pt is in the office for incision check, c section 03-16-19.Pt complains of pain on right side of incision with bending and reaching. [place] 2

## 2019-04-02 LAB — GC/CHLAMYDIA PROBE AMP (~~LOC~~) NOT AT ARMC
Chlamydia: NEGATIVE
Neisseria Gonorrhea: NEGATIVE

## 2019-04-10 ENCOUNTER — Ambulatory Visit: Payer: BC Managed Care – PPO

## 2019-04-13 ENCOUNTER — Ambulatory Visit: Payer: BC Managed Care – PPO | Admitting: Obstetrics and Gynecology

## 2019-04-16 ENCOUNTER — Other Ambulatory Visit (HOSPITAL_COMMUNITY)
Admission: RE | Admit: 2019-04-16 | Discharge: 2019-04-16 | Disposition: A | Discharge status: Home / Self Care | Payer: BC Managed Care – PPO | Source: Ambulatory Visit | Attending: Certified Nurse Midwife | Admitting: Certified Nurse Midwife

## 2019-04-16 ENCOUNTER — Ambulatory Visit (INDEPENDENT_AMBULATORY_CARE_PROVIDER_SITE_OTHER): Payer: BC Managed Care – PPO | Admitting: Certified Nurse Midwife

## 2019-04-16 ENCOUNTER — Other Ambulatory Visit: Payer: Self-pay

## 2019-04-16 ENCOUNTER — Encounter: Payer: Self-pay | Admitting: Certified Nurse Midwife

## 2019-04-16 DIAGNOSIS — N898 Other specified noninflammatory disorders of vagina: Secondary | ICD-10-CM | POA: Diagnosis present

## 2019-04-16 DIAGNOSIS — R87810 Cervical high risk human papillomavirus (HPV) DNA test positive: Secondary | ICD-10-CM | POA: Insufficient documentation

## 2019-04-16 DIAGNOSIS — R87610 Atypical squamous cells of undetermined significance on cytologic smear of cervix (ASC-US): Secondary | ICD-10-CM | POA: Diagnosis not present

## 2019-04-16 DIAGNOSIS — B9689 Other specified bacterial agents as the cause of diseases classified elsewhere: Secondary | ICD-10-CM

## 2019-04-16 DIAGNOSIS — Z30013 Encounter for initial prescription of injectable contraceptive: Secondary | ICD-10-CM

## 2019-04-16 DIAGNOSIS — N760 Acute vaginitis: Secondary | ICD-10-CM

## 2019-04-16 MED ORDER — MEDROXYPROGESTERONE ACETATE 150 MG/ML IM SUSP: 150.0000 mg | INTRAMUSCULAR | 2 refills | Status: DC

## 2019-04-16 MED ORDER — MEDROXYPROGESTERONE ACETATE 150 MG/ML IM SUSP: 150.0000 mg | Freq: Once | INTRAMUSCULAR | Status: DC

## 2019-04-16 NOTE — Progress Notes (Signed)
Post Partum Exam  Kelly [name] is a 23 y.o. G10P0101 female who presents for a postpartum visit. She is 4 weeks postpartum following a low cervical transverse Cesarean section. I have fully reviewed the prenatal and intrapartum course. The delivery was at 35.1 gestational weeks.  Anesthesia: spinal. Postpartum course has been good. Baby's course has been good. Baby is feeding by bottle - Similac Advance. Bleeding staining only. Bowel function is normal. Bladder function is normal. Patient is sexually active (unprotected on 8/14). Contraception method is none. Postpartum depression screening:neg. She reports greenish brown discharge that she noticed 2-3 days ago.  The following portions of the patient's history were reviewed and updated as appropriate: allergies, current medications, past surgical history and problem list. Last pap smear done 09/2018 and was Abnormal- LSIL  Review of Systems Pertinent items noted in HPI and remainder of comprehensive ROS otherwise negative.    Objective:  Blood pressure 104/68, pulse (!) 105, weight 106 lb 12.8 oz (48.4 kg), not currently breastfeeding.  General:  alert, cooperative and no distress   Breasts:  inspection negative, no nipple discharge or bleeding, no masses or nodularity palpable  Lungs: clear to auscultation bilaterally  Heart:  regular rate and rhythm  Abdomen: soft, non-tender; bowel sounds normal; no masses,  no organomegaly   Vulva:  normal  Vagina: vagina positive for vaginal bleeding  Cervix:  friable , negative CMT  Corpus: normal size, contour, position, consistency, mobility, non-tender  Adnexa:  normal adnexa  Rectal Exam: Not performed.        Assessment/Plan:  1. Postpartum care and examination  - Normal postpartum exam. Pap smear done at today's visit.  - Cytology - PAP( [place])  2. Vaginal discharge - Reports vaginal discharge that has been occurring for the past 2-3 days, describes as greenish brown discharge  without odor or irritation  - swab collected, will manage accordingly  - Recent unprotected IC on 8/14 - Cervicovaginal ancillary only( [place])  3. Encounter for prescription for depo-Provera - Not done today  - Patient to come back on 8/31 for depo initiation, instructed to abstain from unprotected IC  - Educated and discussed birth control options in detail, patient decided on Depo - medroxyPROGESTERone (DEPO-PROVERA) 150 MG/ML injection; Inject 1 mL (150 mg total) into the muscle every 3 (three) months.  Dispense: 1 mL; Refill: 2   Follow up in: 1 weeks for depo injection or as needed.   Lajean Manes, CNM 04/16/19, 11:20 AM

## 2019-04-21 LAB — CERVICOVAGINAL ANCILLARY ONLY
Bacterial vaginitis: POSITIVE — AB
Candida vaginitis: NEGATIVE
Chlamydia: NEGATIVE
Neisseria Gonorrhea: NEGATIVE
Trichomonas: NEGATIVE

## 2019-04-21 MED ORDER — TINIDAZOLE 500 MG PO TABS: 2.0000 g | ORAL_TABLET | Freq: Every day | ORAL | 2 refills | Status: DC

## 2019-04-21 NOTE — Addendum Note (Signed)
Addended by: Lajean Manes on: 04/21/2019 11:17 AM   Modules accepted: Orders

## 2019-04-22 LAB — CYTOLOGY - PAP
Diagnosis: UNDETERMINED — AB
HPV 16/18/45 genotyping: NEGATIVE
HPV: DETECTED — AB

## 2019-04-27 ENCOUNTER — Ambulatory Visit (INDEPENDENT_AMBULATORY_CARE_PROVIDER_SITE_OTHER): Payer: BC Managed Care – PPO | Admitting: *Deleted

## 2019-04-27 ENCOUNTER — Other Ambulatory Visit: Payer: Self-pay

## 2019-04-27 VITALS — BP 108/75 | HR 116 | Ht 62.0 in | Wt 109.0 lb

## 2019-04-27 DIAGNOSIS — Z30013 Encounter for initial prescription of injectable contraceptive: Secondary | ICD-10-CM

## 2019-04-27 LAB — POCT URINE PREGNANCY: Preg Test, Ur: NEGATIVE

## 2019-04-27 MED ORDER — MEDROXYPROGESTERONE ACETATE 150 MG/ML IM SUSP
150.0000 mg | Freq: Once | INTRAMUSCULAR | Status: AC
  Administered 2019-04-27: 15:00:00 150 mg via INTRAMUSCULAR

## 2019-04-27 NOTE — Patient Instructions (Addendum)
Medroxyprogesterone injection [Contraceptive] What is this medicine? MEDROXYPROGESTERONE (me DROX ee proe JES te rone) contraceptive injections prevent pregnancy. They provide effective birth control for 3 months. Depo-subQ Provera 104 is also used for treating pain related to endometriosis. This medicine may be used for other purposes; ask your health care provider or pharmacist if you have questions. COMMON BRAND NAME(S): Depo-Provera, Depo-subQ Provera 104 What should I tell my health care provider before I take this medicine? They need to know if you have any of these conditions:  frequently drink alcohol  asthma  blood vessel disease or a history of a blood clot in the lungs or legs  bone disease such as osteoporosis  breast cancer  diabetes  eating disorder (anorexia nervosa or bulimia)  high blood pressure  HIV infection or AIDS  kidney disease  liver disease  mental depression  migraine  seizures (convulsions)  stroke  tobacco smoker  vaginal bleeding  an unusual or allergic reaction to medroxyprogesterone, other hormones, medicines, foods, dyes, or preservatives  pregnant or trying to get pregnant  breast-feeding How should I use this medicine? Depo-Provera Contraceptive injection is given into a muscle. Depo-subQ Provera 104 injection is given under the skin. These injections are given by a health care professional. You must not be pregnant before getting an injection. The injection is usually given during the first 5 days after the start of a menstrual period or 6 weeks after delivery of a baby. Talk to your pediatrician regarding the use of this medicine in children. Special care may be needed. These injections have been used in female children who have started having menstrual periods. Overdosage: If you think you have taken too much of this medicine contact a poison control center or emergency room at once. NOTE: This medicine is only for you. Do not  share this medicine with others. What if I miss a dose? Try not to miss a dose. You must get an injection once every 3 months to maintain birth control. If you cannot keep an appointment, call and reschedule it. If you wait longer than 13 weeks between Depo-Provera contraceptive injections or longer than 14 weeks between Depo-subQ Provera 104 injections, you could get pregnant. Use another method for birth control if you miss your appointment. You may also need a pregnancy test before receiving another injection. What may interact with this medicine? Do not take this medicine with any of the following medications:  bosentan This medicine may also interact with the following medications:  aminoglutethimide  antibiotics or medicines for infections, especially rifampin, rifabutin, rifapentine, and griseofulvin  aprepitant  barbiturate medicines such as phenobarbital or primidone  bexarotene  carbamazepine  medicines for seizures like ethotoin, felbamate, oxcarbazepine, phenytoin, topiramate  modafinil  St. John's wort This list may not describe all possible interactions. Give your health care provider a list of all the medicines, herbs, non-prescription drugs, or dietary supplements you use. Also tell them if you smoke, drink alcohol, or use illegal drugs. Some items may interact with your medicine. What should I watch for while using this medicine? This drug does not protect you against HIV infection (AIDS) or other sexually transmitted diseases. Use of this product may cause you to lose calcium from your bones. Loss of calcium may cause weak bones (osteoporosis). Only use this product for more than 2 years if other forms of birth control are not right for you. The longer you use this product for birth control the more likely you will be at risk   for weak bones. Ask your health care professional how you can keep strong bones. You may have a change in bleeding pattern or irregular periods.  Many females stop having periods while taking this drug. If you have received your injections on time, your chance of being pregnant is very low. If you think you may be pregnant, see your health care professional as soon as possible. Tell your health care professional if you want to get pregnant within the next year. The effect of this medicine may last a long time after you get your last injection. What side effects may I notice from receiving this medicine? Side effects that you should report to your doctor or health care professional as soon as possible:  allergic reactions like skin rash, itching or hives, swelling of the face, lips, or tongue  breast tenderness or discharge  breathing problems  changes in vision  depression  feeling faint or lightheaded, falls  fever  pain in the abdomen, chest, groin, or leg  problems with balance, talking, walking  unusually weak or tired  yellowing of the eyes or skin Side effects that usually do not require medical attention (report to your doctor or health care professional if they continue or are bothersome):  acne  fluid retention and swelling  headache  irregular periods, spotting, or absent periods  temporary pain, itching, or skin reaction at site where injected  weight gain This list may not describe all possible side effects. Call your doctor for medical advice about side effects. You may report side effects to FDA at 1-800-FDA-1088. Where should I keep my medicine? This does not apply. The injection will be given to you by a health care professional. NOTE: This sheet is a summary. It may not cover all possible information. If you have questions about this medicine, talk to your doctor, pharmacist, or health care provider.  2020 Elsevier/Gold Standard (2008-09-03 18:37:56)  

## 2019-04-27 NOTE — Progress Notes (Signed)
    Subjective:  Pt in for Depo Provera injection.  Last sex was 2 weeks ago. PP exam completed 1 week ago.  Objective: Need for contraception. No unusual complaints.    Assessment: Pregnancy test negative: Pt tolerated Depo injection. Depo given Right upper outer quadrant. .  Plan:  Next injection due November 16-30, 2020.  Advised patient to use another reliable form of birth control for 1-2 weeks  Derl Barrow, RN

## 2019-07-20 ENCOUNTER — Ambulatory Visit: Payer: BC Managed Care – PPO

## 2019-08-01 ENCOUNTER — Other Ambulatory Visit: Payer: Self-pay

## 2019-08-01 DIAGNOSIS — Z20822 Contact with and (suspected) exposure to covid-19: Secondary | ICD-10-CM

## 2019-08-04 LAB — NOVEL CORONAVIRUS, NAA: SARS-CoV-2, NAA: NOT DETECTED

## 2019-08-06 ENCOUNTER — Other Ambulatory Visit: Payer: Self-pay

## 2019-08-06 ENCOUNTER — Ambulatory Visit (INDEPENDENT_AMBULATORY_CARE_PROVIDER_SITE_OTHER): Payer: BC Managed Care – PPO

## 2019-08-06 DIAGNOSIS — Z3202 Encounter for pregnancy test, result negative: Secondary | ICD-10-CM

## 2019-08-06 DIAGNOSIS — Z3200 Encounter for pregnancy test, result unknown: Secondary | ICD-10-CM

## 2019-08-06 LAB — POCT URINE PREGNANCY: Preg Test, Ur: NEGATIVE

## 2019-08-06 NOTE — Progress Notes (Signed)
Patient is in the office for depo restart, 1st UPT today is negative, pt will return in 2 weeks for 2nd UPT and depo injection.

## 2019-08-19 ENCOUNTER — Ambulatory Visit (INDEPENDENT_AMBULATORY_CARE_PROVIDER_SITE_OTHER): Payer: BC Managed Care – PPO

## 2019-08-19 ENCOUNTER — Other Ambulatory Visit: Payer: Self-pay

## 2019-08-19 DIAGNOSIS — Z3042 Encounter for surveillance of injectable contraceptive: Secondary | ICD-10-CM | POA: Diagnosis not present

## 2019-08-19 DIAGNOSIS — Z3202 Encounter for pregnancy test, result negative: Secondary | ICD-10-CM | POA: Diagnosis not present

## 2019-08-19 LAB — POCT URINE PREGNANCY: Preg Test, Ur: NEGATIVE

## 2019-08-19 MED ORDER — MEDROXYPROGESTERONE ACETATE 150 MG/ML IM SUSP
150.0000 mg | INTRAMUSCULAR | Status: DC
  Administered 2019-08-19 – 2020-02-15 (×2): 150 mg via INTRAMUSCULAR

## 2019-08-19 NOTE — Progress Notes (Signed)
I have reviewed this chart and agree with the RN/CMA assessment and management.    K. Meryl Tyshea Imel, M.D. Attending [facility] (Faculty Practice)   

## 2019-08-19 NOTE — Progress Notes (Signed)
Pt is in the office for depo restart, 2nd UPT negative. Administered depo in LUOQ and pt tolerated well. .. Administrations This Visit    medroxyPROGESTERone (DEPO-PROVERA) injection 150 mg    Admin Date 08/19/2019 Action Given Dose 150 mg Route Intramuscular Administered By Hinton Lovely, RN

## 2019-09-24 ENCOUNTER — Ambulatory Visit: Payer: BC Managed Care – PPO | Attending: Internal Medicine

## 2019-09-24 DIAGNOSIS — Z20822 Contact with and (suspected) exposure to covid-19: Secondary | ICD-10-CM

## 2019-09-25 LAB — NOVEL CORONAVIRUS, NAA: SARS-CoV-2, NAA: NOT DETECTED

## 2019-11-01 ENCOUNTER — Other Ambulatory Visit: Payer: Self-pay

## 2019-11-01 DIAGNOSIS — Z20822 Contact with and (suspected) exposure to covid-19: Secondary | ICD-10-CM

## 2019-11-02 LAB — NOVEL CORONAVIRUS, NAA: SARS-CoV-2, NAA: NOT DETECTED

## 2019-11-11 ENCOUNTER — Ambulatory Visit: Payer: BC Managed Care – PPO

## 2019-11-23 ENCOUNTER — Other Ambulatory Visit: Payer: Self-pay

## 2019-11-23 ENCOUNTER — Ambulatory Visit (INDEPENDENT_AMBULATORY_CARE_PROVIDER_SITE_OTHER): Payer: BC Managed Care – PPO

## 2019-11-23 VITALS — BP 111/76 | HR 72 | Ht 62.0 in | Wt 128.0 lb

## 2019-11-23 DIAGNOSIS — Z3042 Encounter for surveillance of injectable contraceptive: Secondary | ICD-10-CM | POA: Diagnosis not present

## 2019-11-23 MED ORDER — MEDROXYPROGESTERONE ACETATE 150 MG/ML IM SUSP
150.0000 mg | Freq: Once | INTRAMUSCULAR | Status: AC
  Administered 2019-11-23: 16:00:00 150 mg via INTRAMUSCULAR

## 2019-11-23 NOTE — Progress Notes (Signed)
Presents for DEPO Injection, given in RUOQ, tolerated well.  Next DEPO June 14-28/2021  Administrations This Visit    medroxyPROGESTERone (DEPO-PROVERA) injection 150 mg    Admin Date 11/23/2019 Action Given Dose 150 mg Route Intramuscular Administered By Maretta Bees, RMA

## 2020-02-11 ENCOUNTER — Ambulatory Visit: Payer: BC Managed Care – PPO

## 2020-02-11 ENCOUNTER — Other Ambulatory Visit: Payer: Self-pay

## 2020-02-11 MED ORDER — MEDROXYPROGESTERONE ACETATE 150 MG/ML IM SUSP: 150.0000 mg | INTRAMUSCULAR | 3 refills | Status: DC

## 2020-02-11 NOTE — Progress Notes (Signed)
RX sent to [facility] [address]

## 2020-02-15 ENCOUNTER — Ambulatory Visit (INDEPENDENT_AMBULATORY_CARE_PROVIDER_SITE_OTHER): Payer: BC Managed Care – PPO | Admitting: *Deleted

## 2020-02-15 ENCOUNTER — Other Ambulatory Visit: Payer: Self-pay

## 2020-02-15 DIAGNOSIS — Z3042 Encounter for surveillance of injectable contraceptive: Secondary | ICD-10-CM

## 2020-02-15 NOTE — Progress Notes (Signed)
Pt is in office for depo injection. Pt is on time for injection.  Depo given, pt tolerated well.   Pt advised to RTO 9/6-9/20/21 for next depo.  Pt has no other concerns today.  Administrations This Visit    medroxyPROGESTERone (DEPO-PROVERA) injection 150 mg    Admin Date 02/15/2020 Action Given Dose 150 mg Route Intramuscular Administered By Lanney Gins, CMA

## 2020-02-16 NOTE — Progress Notes (Signed)
Patient was assessed and managed by nursing staff during this encounter. I have reviewed the chart and agree with the documentation and plan. I have also made any necessary editorial changes.  Catalina Antigua, MD 02/16/2020 1:46 PM

## 2020-05-09 ENCOUNTER — Ambulatory Visit: Payer: BC Managed Care – PPO

## 2020-05-13 ENCOUNTER — Ambulatory Visit (INDEPENDENT_AMBULATORY_CARE_PROVIDER_SITE_OTHER): Payer: BC Managed Care – PPO

## 2020-05-13 ENCOUNTER — Other Ambulatory Visit: Payer: Self-pay

## 2020-05-13 DIAGNOSIS — Z3042 Encounter for surveillance of injectable contraceptive: Secondary | ICD-10-CM | POA: Diagnosis not present

## 2020-05-13 MED ORDER — MEDROXYPROGESTERONE ACETATE 150 MG/ML IM SUSP: 150.0000 mg | INTRAMUSCULAR | 2 refills | Status: DC

## 2020-05-13 MED ORDER — MEDROXYPROGESTERONE ACETATE 150 MG/ML IM SUSP
150.0000 mg | INTRAMUSCULAR | Status: DC
  Administered 2020-05-13: 150 mg via INTRAMUSCULAR

## 2020-05-13 NOTE — Progress Notes (Signed)
Agree with A & P. 

## 2020-05-13 NOTE — Progress Notes (Signed)
Patient presents for Depo Injection.  Last Depo :02/15/20  Next Depo:07/29/20-08/12/20   Pt tolerated injection without any problems. LUOQ  Patient advised to make next Depo Appt at check out.

## 2020-08-03 ENCOUNTER — Other Ambulatory Visit: Payer: Self-pay

## 2020-08-03 ENCOUNTER — Ambulatory Visit (INDEPENDENT_AMBULATORY_CARE_PROVIDER_SITE_OTHER): Payer: BC Managed Care – PPO

## 2020-08-03 VITALS — BP 130/91 | HR 83 | Wt 134.0 lb

## 2020-08-03 DIAGNOSIS — Z3042 Encounter for surveillance of injectable contraceptive: Secondary | ICD-10-CM | POA: Diagnosis not present

## 2020-08-03 MED ORDER — MEDROXYPROGESTERONE ACETATE 150 MG/ML IM SUSP
150.0000 mg | Freq: Once | INTRAMUSCULAR | Status: AC
  Administered 2020-08-03: 150 mg via INTRAMUSCULAR

## 2020-08-03 NOTE — Progress Notes (Signed)
GYN presents for DEPO Injection, given in LUOQ, tolerated well.  Last PAP 04/16/2019.   Next DEPO due Feb. 23 - Mar. 9, 2022  Administrations This Visit    medroxyPROGESTERone (DEPO-PROVERA) injection 150 mg    Admin Date 08/03/2020 Action Given Dose 150 mg Route Intramuscular Administered By Maretta Bees, RMA

## 2020-09-15 IMAGING — DX PORTABLE CHEST - 1 VIEW
1 series · 1 of 1 positions shown · non-contrast
Comparison: April 24, 2017

CLINICAL DATA: Cough and fever

EXAM:
PORTABLE CHEST 1 VIEW

[chest]
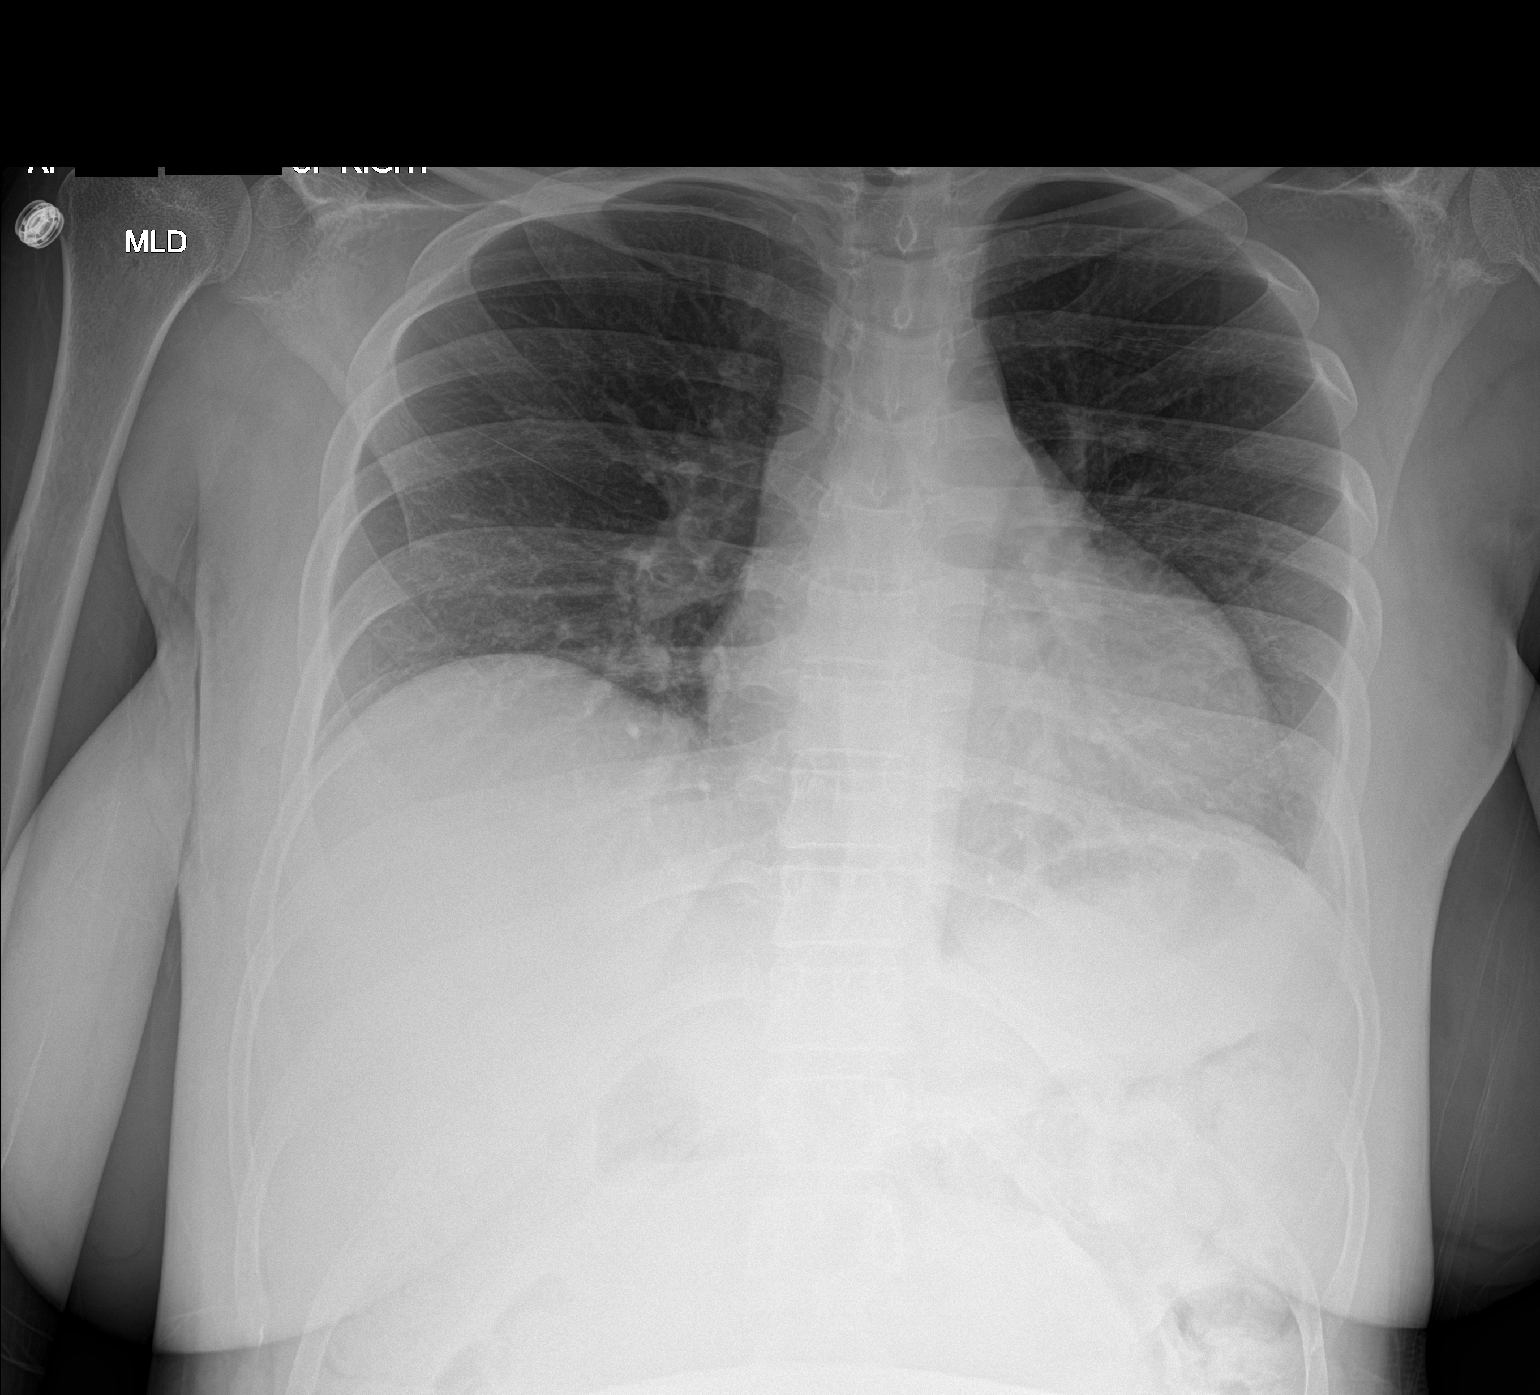

[1 of 1 positions shown; findings below may reference images not displayed]

FINDINGS: No edema or consolidation. Heart size and pulmonary vascularity are
normal. No adenopathy. No bone lesions.
IMPRESSION: No edema or consolidation.

## 2020-10-24 ENCOUNTER — Ambulatory Visit: Payer: BC Managed Care – PPO

## 2020-10-24 ENCOUNTER — Other Ambulatory Visit: Payer: Self-pay | Admitting: *Deleted

## 2020-10-24 DIAGNOSIS — Z30013 Encounter for initial prescription of injectable contraceptive: Secondary | ICD-10-CM

## 2020-10-24 MED ORDER — MEDROXYPROGESTERONE ACETATE 150 MG/ML IM SUSP: 150.0000 mg | INTRAMUSCULAR | 0 refills | Status: DC

## 2020-10-24 NOTE — Progress Notes (Signed)
Depo refill sent to pharmacy. Pt will need AEX to continue refills.

## 2020-10-26 IMAGING — US US MFM FETAL BPP WO NON STRESS
1 series · 16 of 16 positions shown · non-contrast
Comparison: none

[Series 1: us mfm fetal bpp wo non stress · 16 acquisitions, 16 frames shown]
[im 1/16]
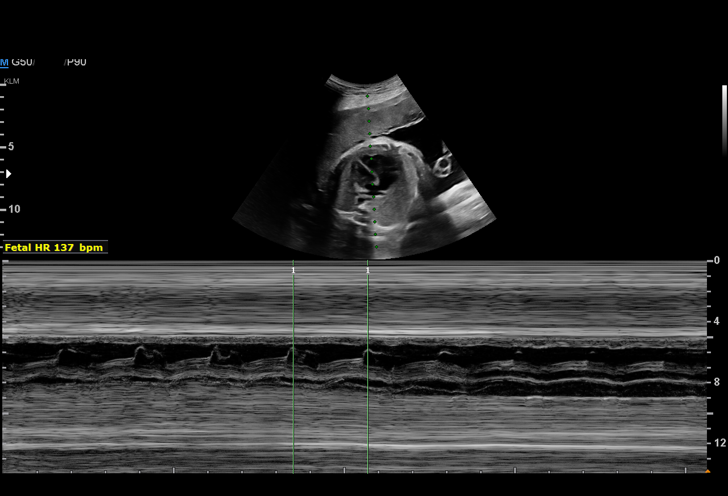
[im 2/16]
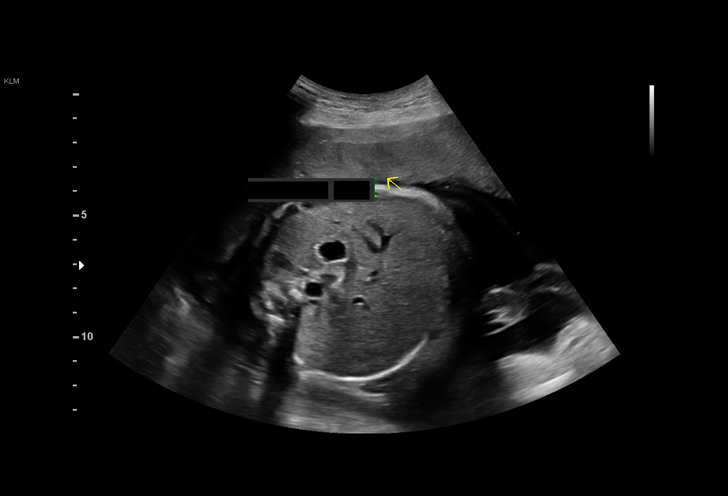
[im 3/16]
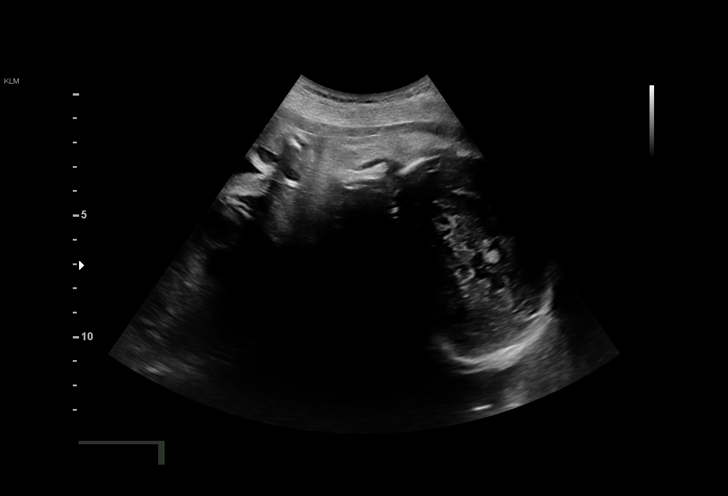
[im 4/16]
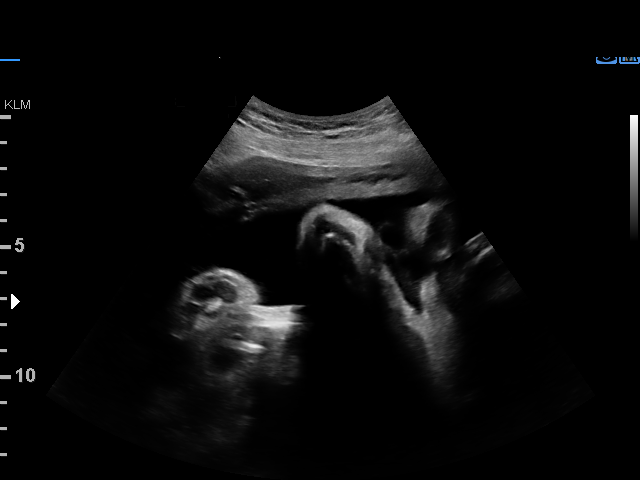
[im 5/16]
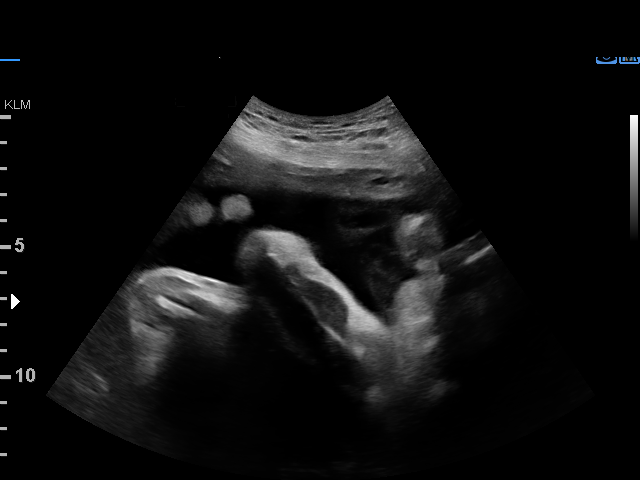
[im 6/16]
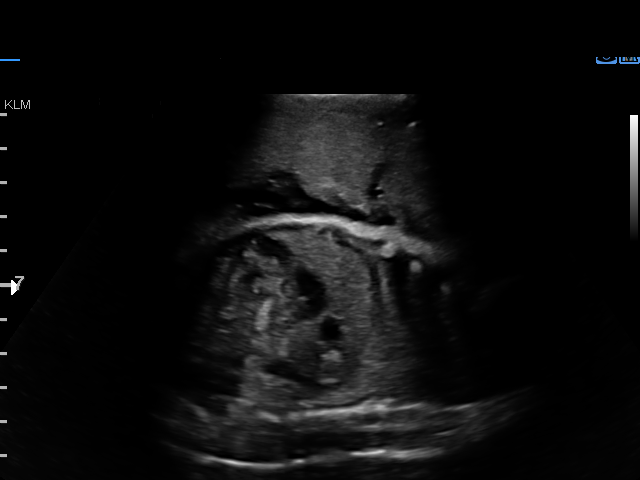
[im 7/16]
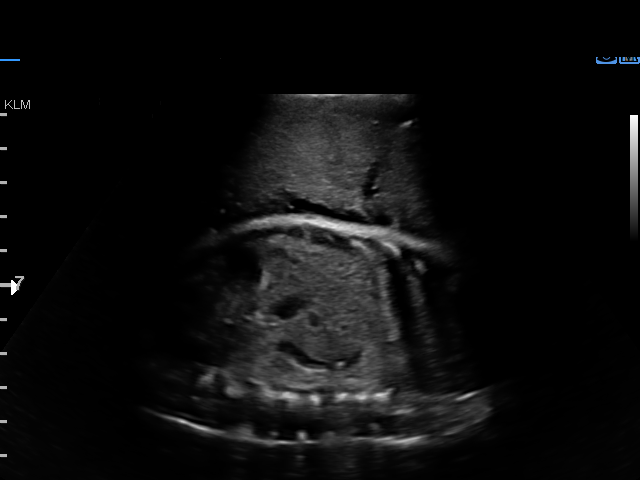
[im 8/16]
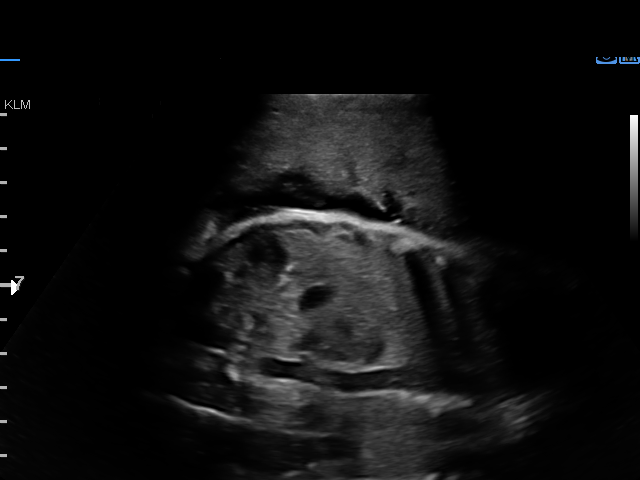
[im 9/16]
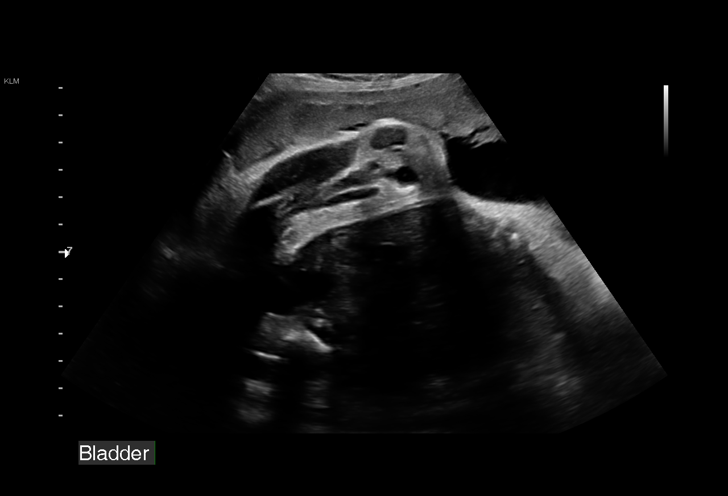
[im 10/16]
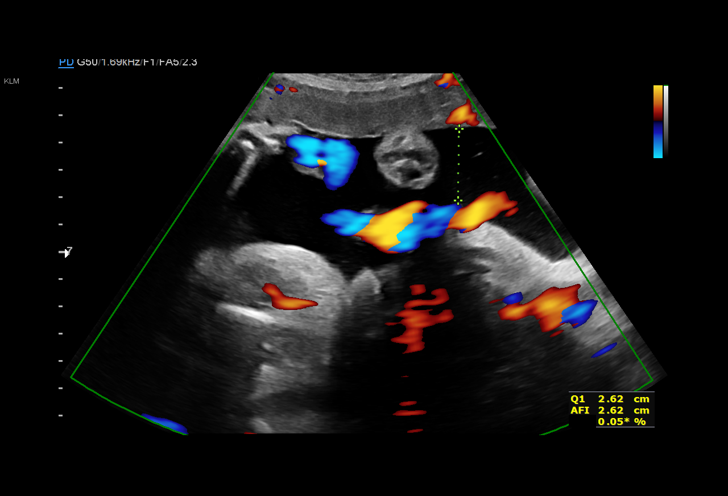
[im 11/16]
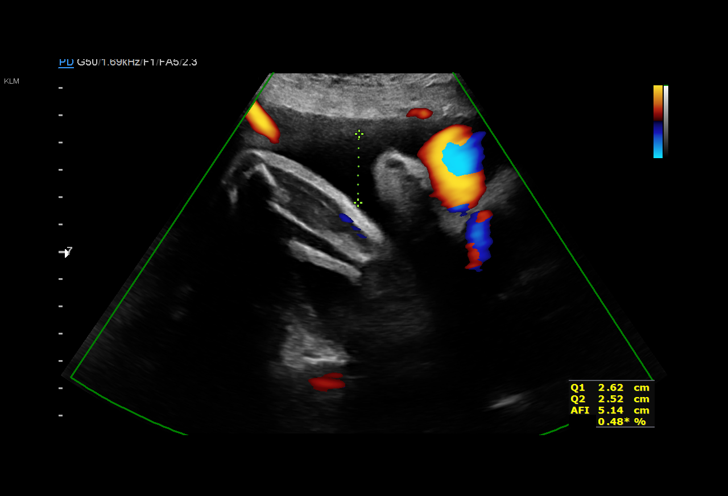
[im 12/16]
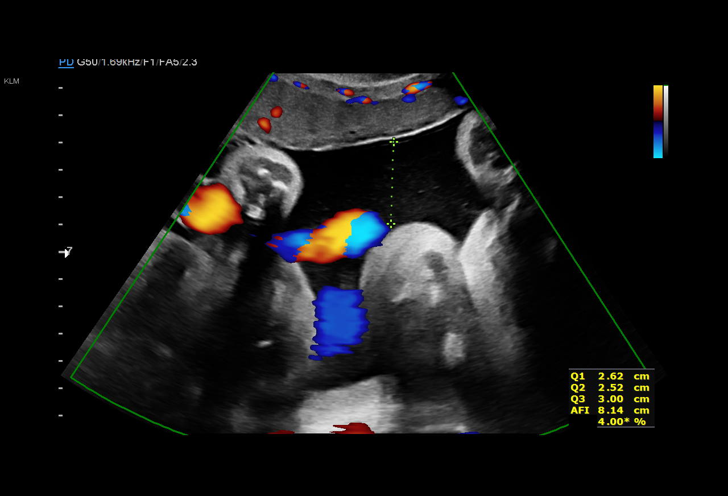
[im 13/16]
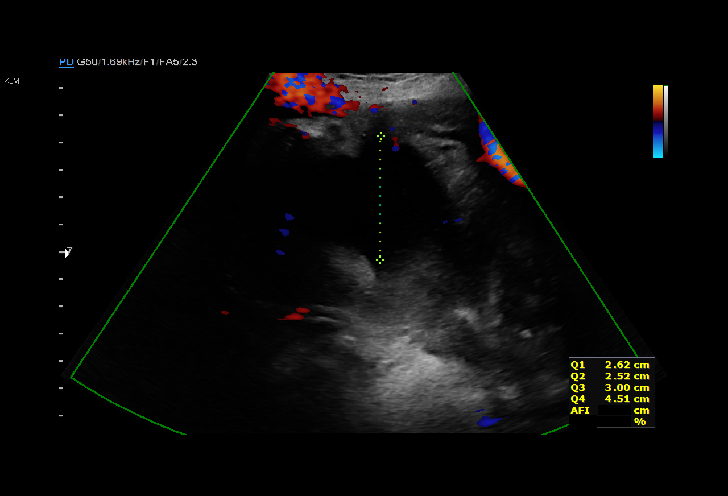
[im 14/16]
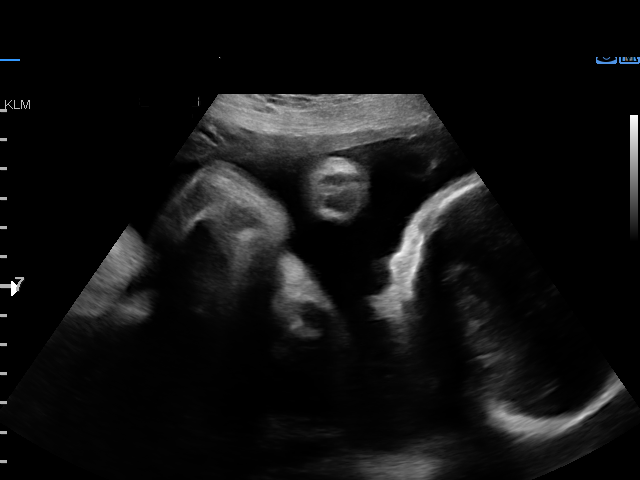
[im 15/16]
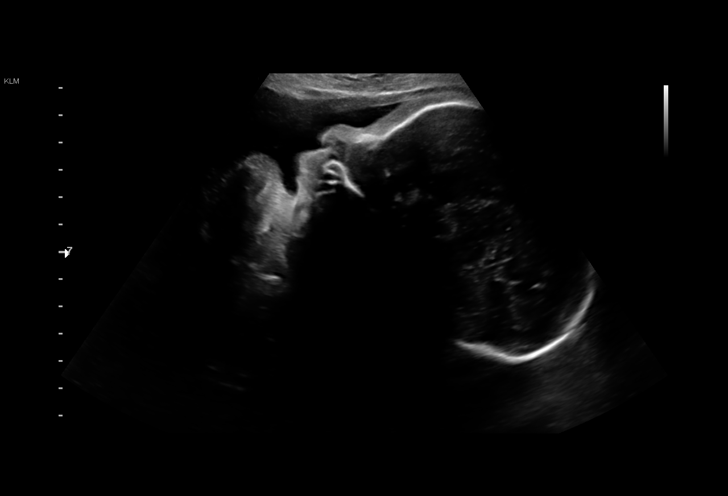
[im 16/16]
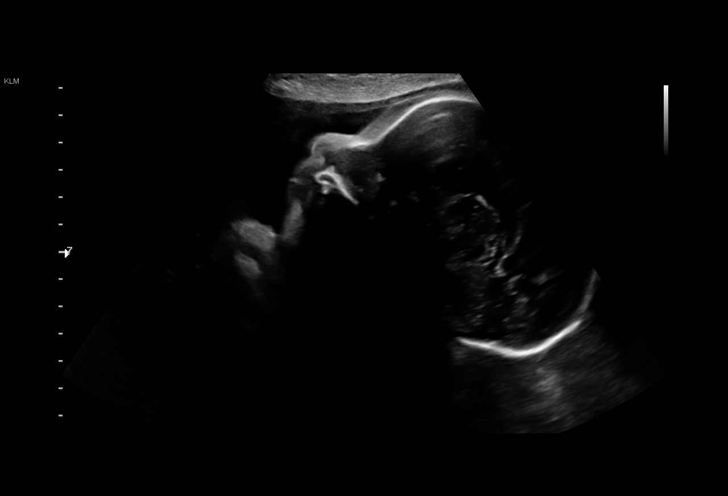

[16 of 16 positions shown; findings below may reference images not displayed]

MAU/Triage

 ----------------------------------------------------------------------

 ----------------------------------------------------------------------
Indications

  Non-reactive NST
  32 weeks gestation of pregnancy
  Systemic lupus complicating pregnancy,         O26.893,
  third trimester
 ----------------------------------------------------------------------
Vital Signs

                                                Height:        5'2"
Fetal Evaluation

 Num Of Fetuses:         1
 Fetal Heart Rate(bpm):  137
 Cardiac Activity:       Observed
 Presentation:           Cephalic

 Amniotic Fluid
 AFI FV:      Within normal limits

 AFI Sum(cm)     %Tile       Largest Pocket(cm)
 12.65           37

 RUQ(cm)       RLQ(cm)       LUQ(cm)        LLQ(cm)
 2.62          4.51          2.52           3
Biophysical Evaluation

 Amniotic F.V:   Within normal limits       F. Tone:        Observed
 F. Movement:    Observed                   Score:          [DATE]
 F. Breathing:   Observed
OB History
 Gravidity:    1
Gestational Age

 LMP:           32w 2d        Date:  07/13/18                 EDD:   04/19/19
 Best:          32w 2d     Det. By:  LMP  (07/13/18)          EDD:   04/19/19
Anatomy

 Stomach:               Appears normal, left   Bladder:                Appears normal
                        sided
Impression

 Biophysical profile [DATE]
Recommendations

 Management per inpatient team.

## 2020-10-26 IMAGING — US ULTRASOUND ABDOMEN COMPLETE
1 series · 15 of 25 positions shown · non-contrast
Comparison: Right upper quadrant abdominal ultrasound 03/06/2017.

CLINICAL DATA: Continuous right upper quadrant abdominal pain.
Patient is 32 weeks pregnant.

EXAM:
ABDOMEN ULTRASOUND COMPLETE

[Series 1: ultrasound abdomen complete · 15 of 64 slices shown]
[im 1/64]
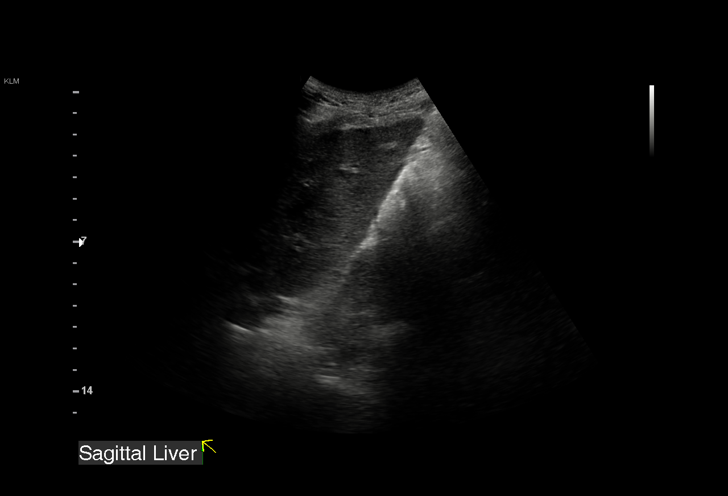
[im 6/64]
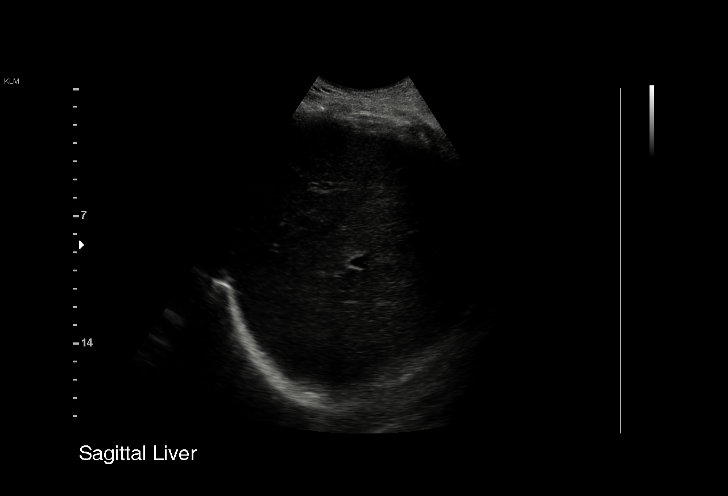
[im 11/64]
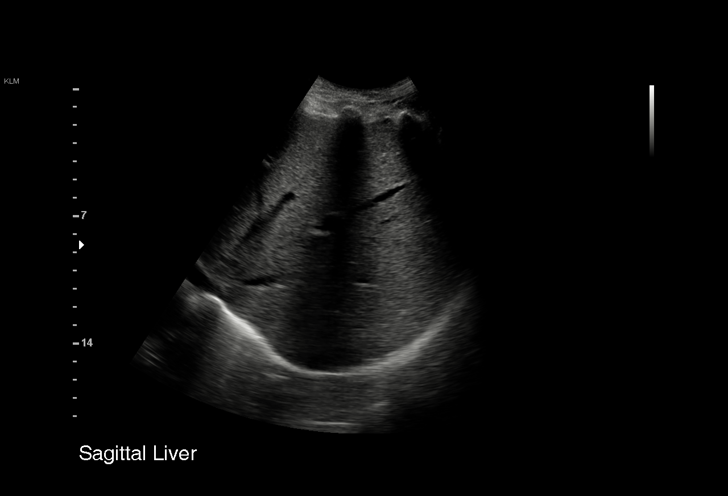
[im 14/64]
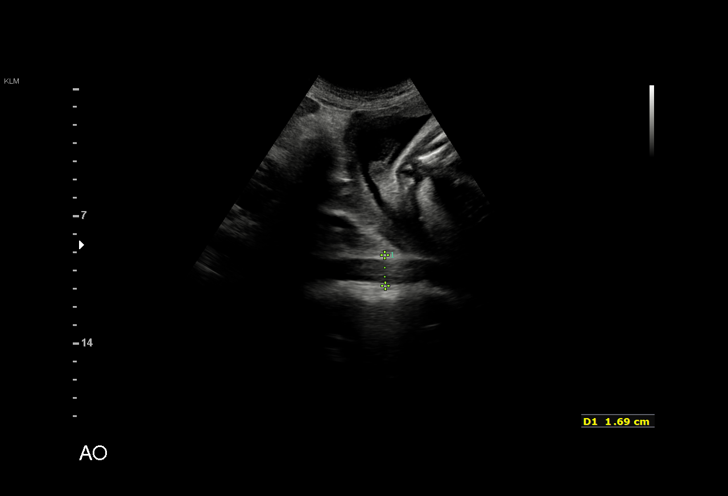
[im 19/64]
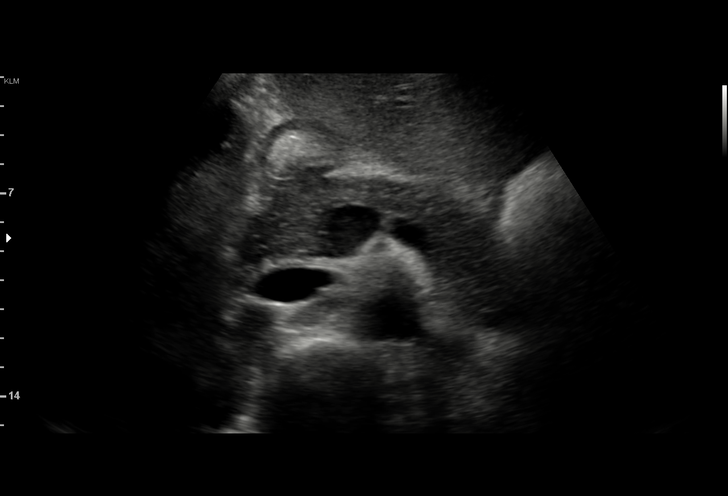
[im 24/64]
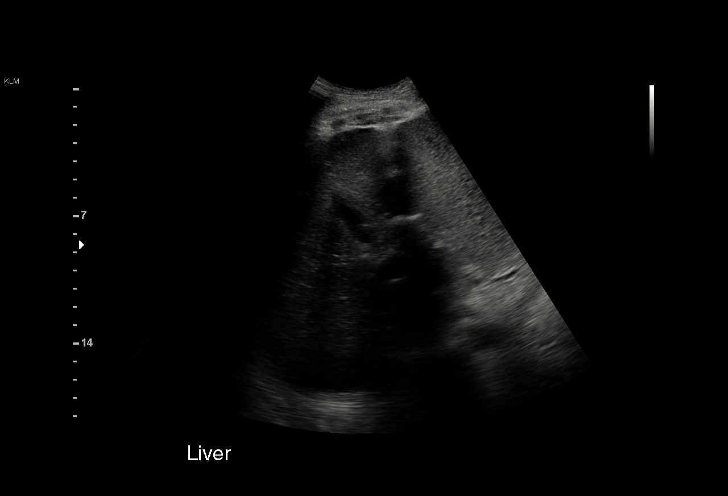
[im 27/64]
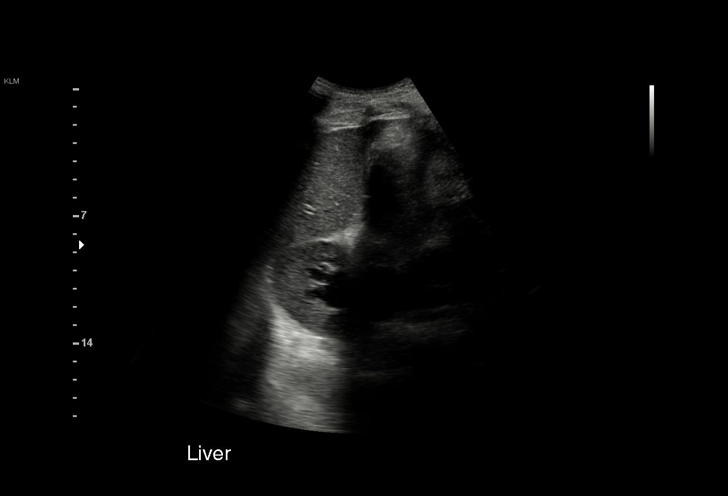
[im 32/64]
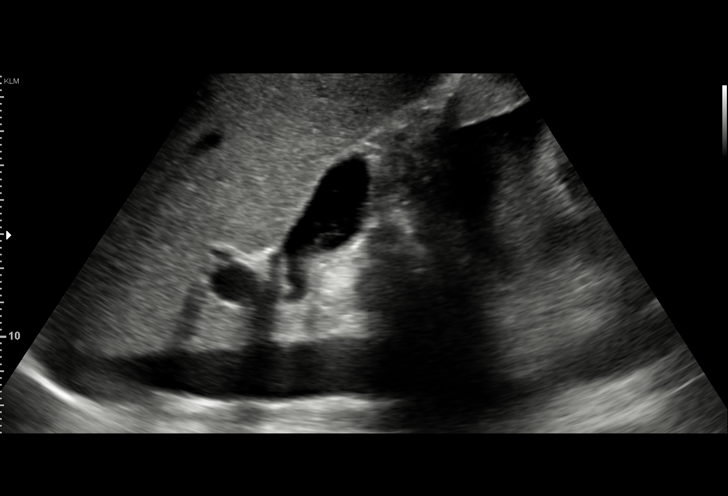
[im 37/64]
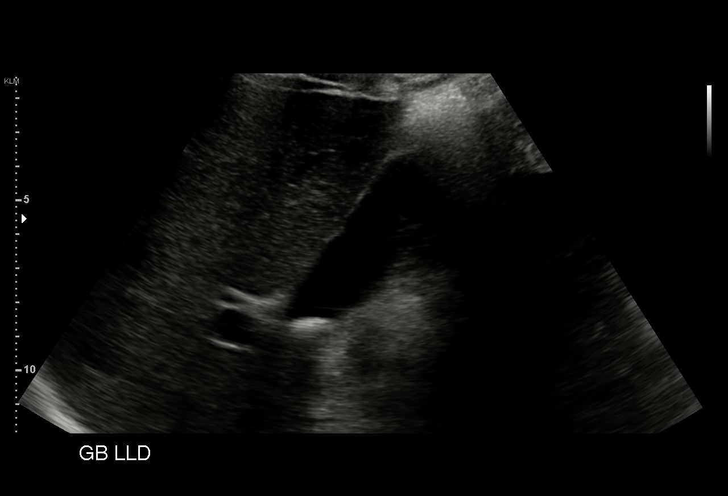
[im 40/64]
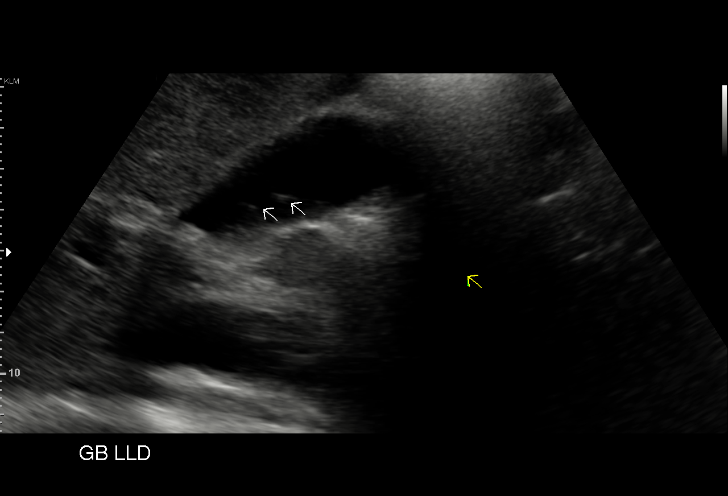
[im 45/64]
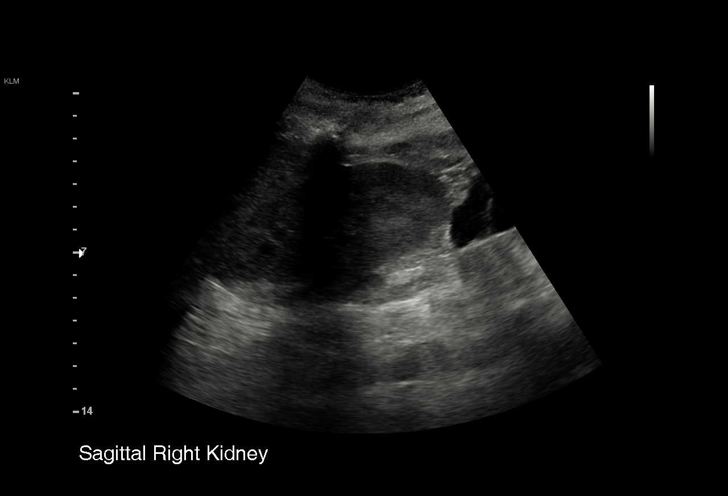
[im 50/64]
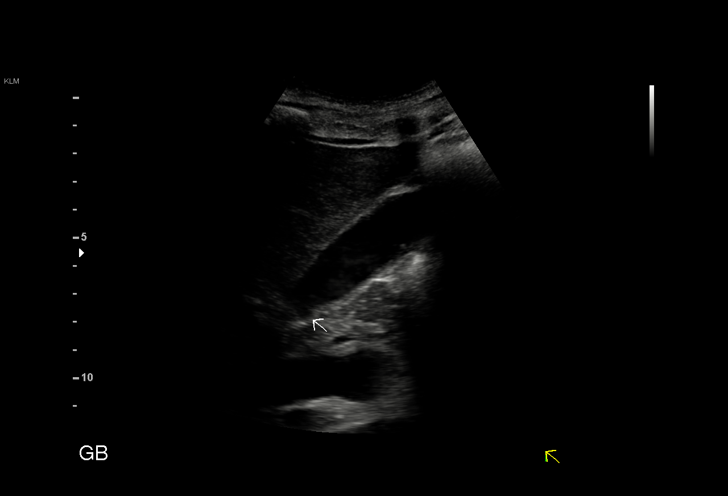
[im 53/64]
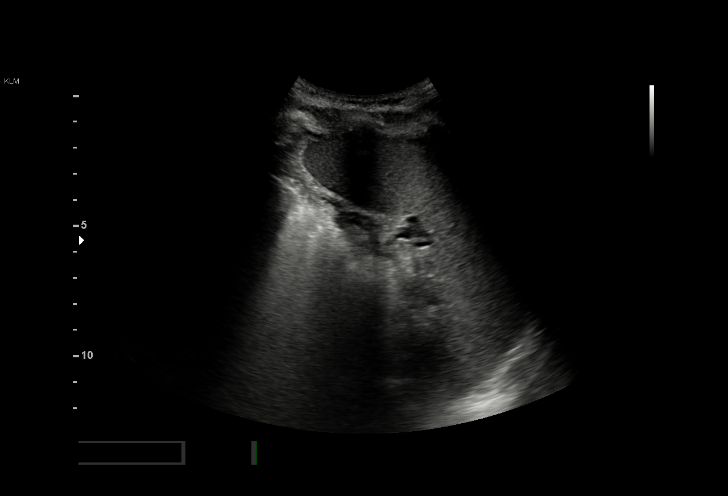
[im 58/64]
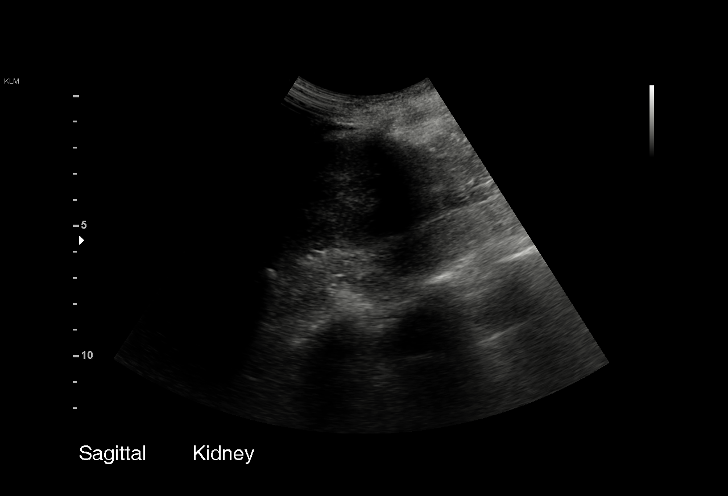
[im 64/64]
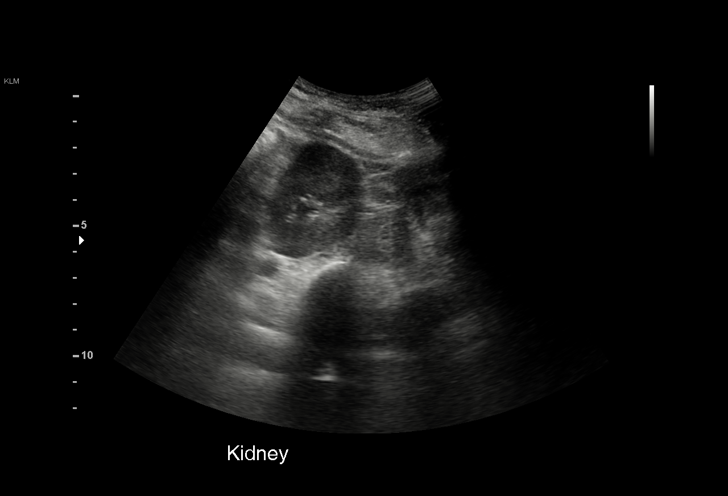

[15 of 25 positions shown; findings below may reference images not displayed]

FINDINGS: Gallbladder: Mild gallbladder sludge and questionable small polyps.
No discrete stones. No gallbladder wall thickening or sonographic
Murphy's sign.

Common bile duct: Diameter: 1 mm

Liver: No focal lesion identified. Within normal limits in
parenchymal echogenicity. Portal vein is patent on color Doppler
imaging with normal direction of blood flow towards the liver.

IVC: No abnormality visualized.

Pancreas: Visualized portion unremarkable.

Spleen: Size and appearance within normal limits.

Right Kidney: Length: 13.5 cm. Mild right-sided hydronephrosis. No
focal cortical lesion.

Left Kidney: Length: 10.5 cm. Echogenicity within normal limits. No
mass or hydronephrosis visualized.

Abdominal aorta: No aneurysm visualized.

Other findings: None.
IMPRESSION: 1. Mild right-sided hydronephrosis, likely physiologic during 3rd
trimester of pregnancy. Correlate clinically.
2. Gallbladder sludge with questionable small polyps. No gallstones
or biliary dilatation.

## 2020-10-26 IMAGING — US US MFM OB FOLLOW UP
1 series · 14 of 28 positions shown · non-contrast
Comparison: none

[Series 1: us mfm ob follow up · 41 acquisitions, 14 frames shown]
[im 2/41]
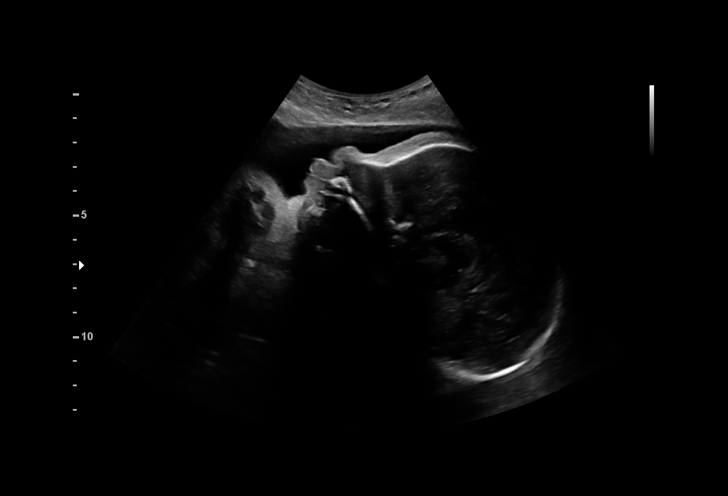
[im 5/41]
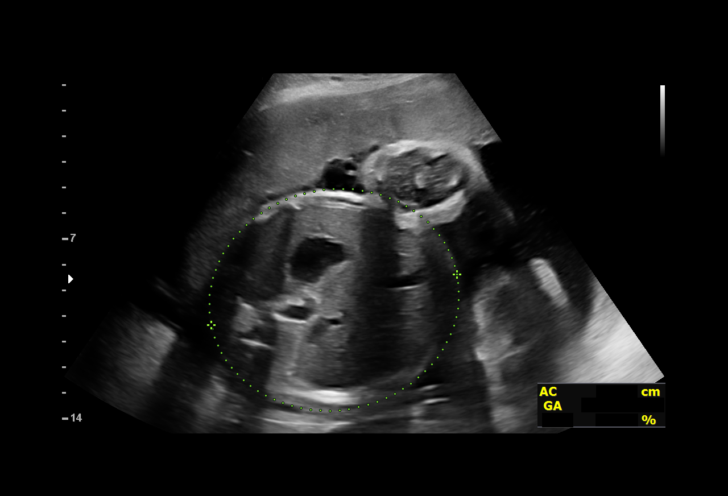
[im 8/41]
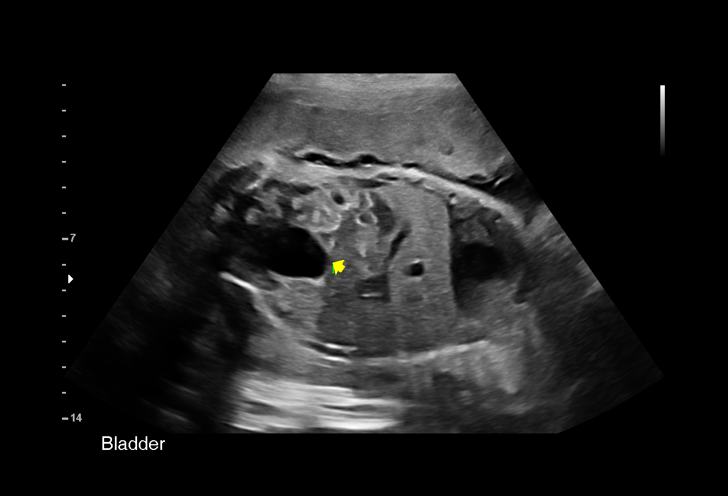
[im 11/41]
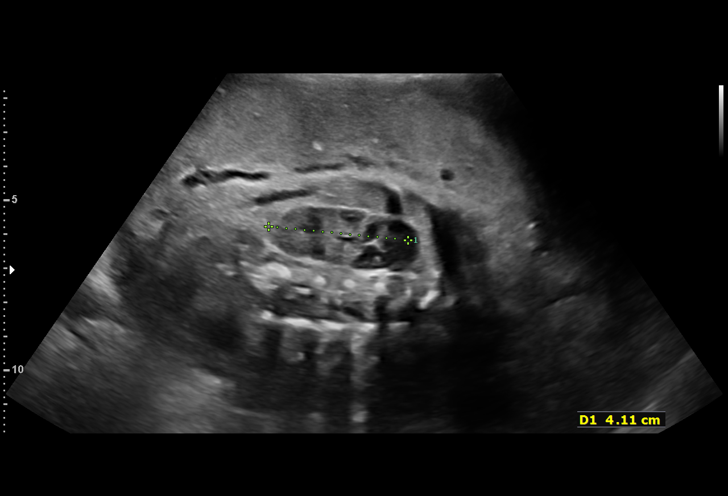
[im 14/41]
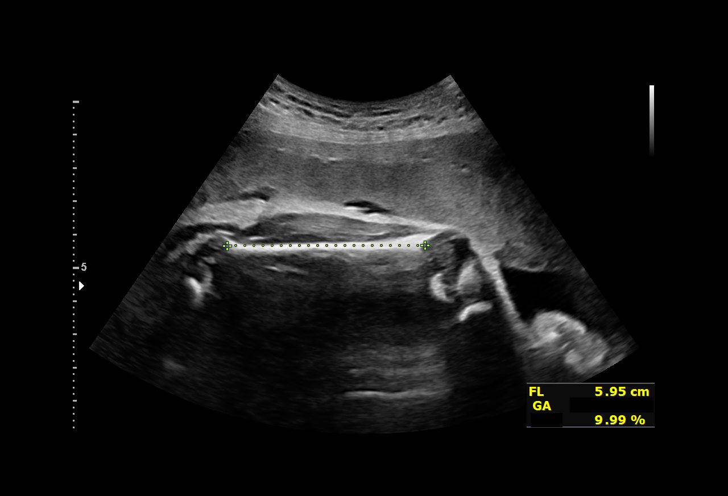
[im 17/41]
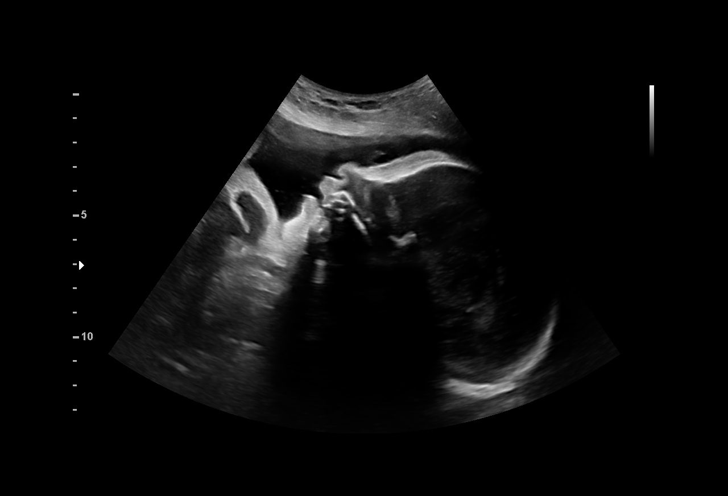
[im 20/41]
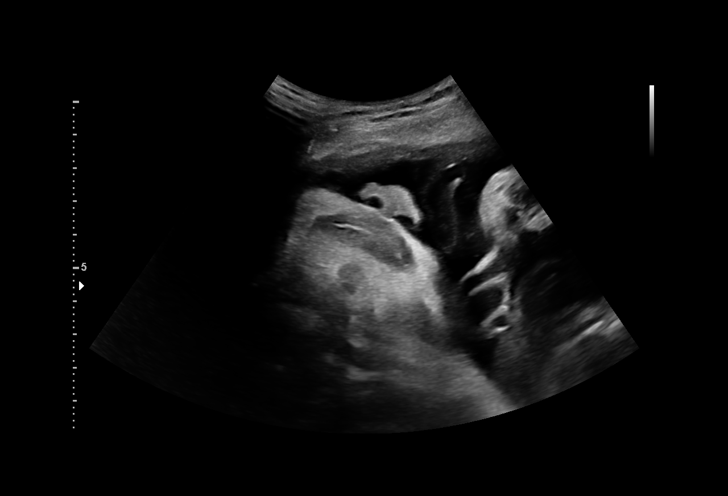
[im 23/41]
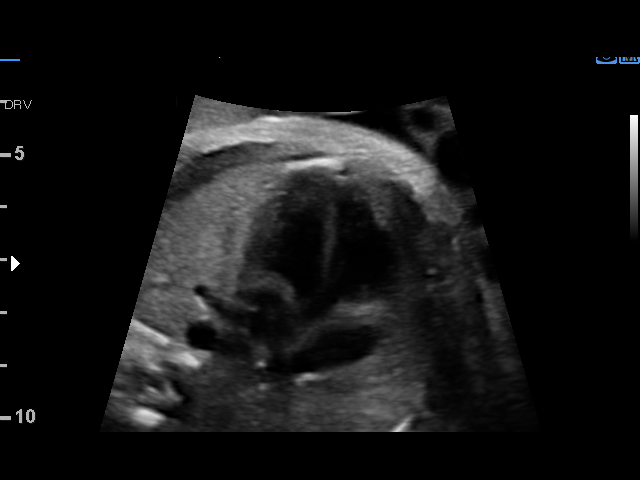
[im 26/41]
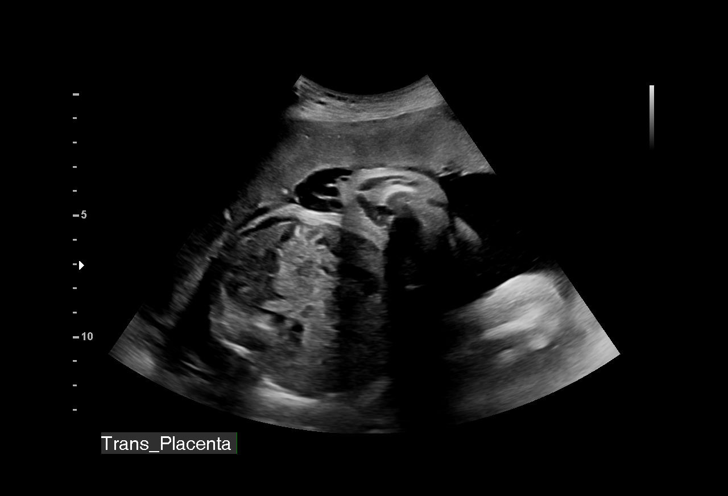
[im 29/41]
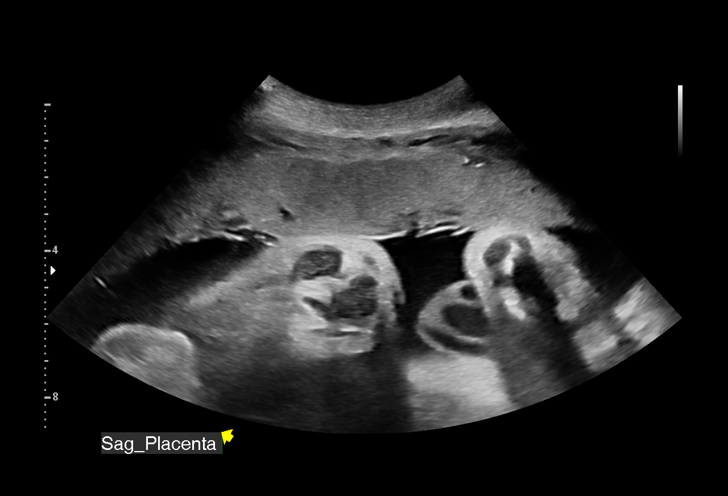
[im 32/41]
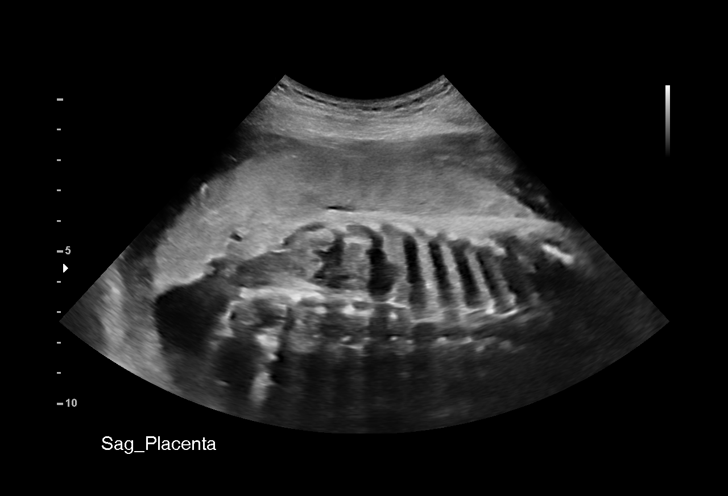
[im 35/41]
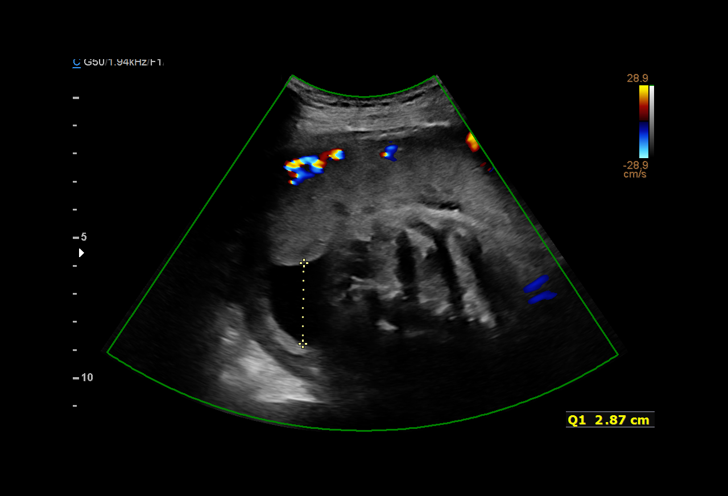
[im 38/41]
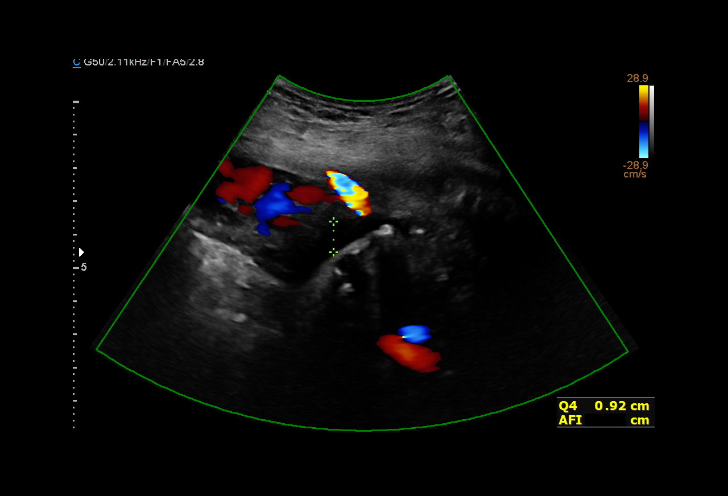
[im 41/41]
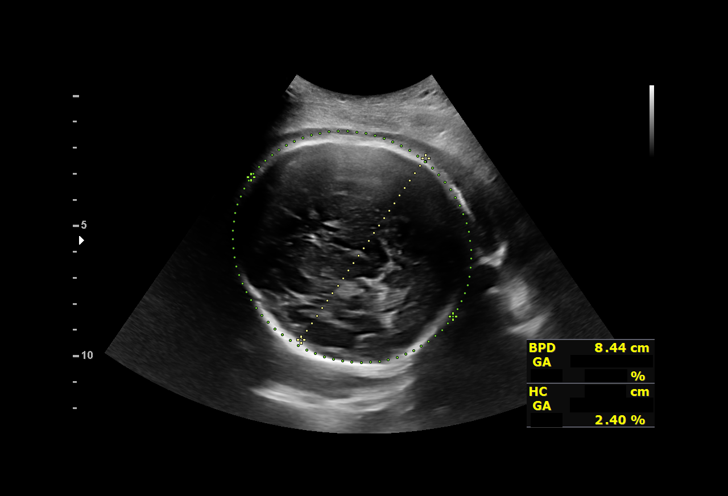

[14 of 28 positions shown; findings below may reference images not displayed]

[facility] [HOSPITAL]
 Attending:        Blondin[place]a Samsonaite      Secondary Phy.:    [facility]-
                                                             MAU/Triage
                   PERECTIONIST CNM                              [HOSPITAL]

                                                       [place]
 ----------------------------------------------------------------------

 ----------------------------------------------------------------------
Indications

  Systemic lupus complicating pregnancy,         O26.893,
  third trimester
  32 weeks gestation of pregnancy
  Encounter for other antenatal screening
  follow-up
 ----------------------------------------------------------------------
Vital Signs

 BMI:
Fetal Evaluation

 Num Of Fetuses:          1
 Fetal Heart Rate(bpm):   142
 Cardiac Activity:        Observed
 Presentation:            Cephalic
 Placenta:                Anterior
 P. Cord Insertion:       Previously Visualized

 Amniotic Fluid
 AFI FV:      Within normal limits

 AFI Sum(cm)     %Tile       Largest Pocket(cm)
 13.08           40

 RUQ(cm)       RLQ(cm)       LUQ(cm)        LLQ(cm)

Biometry
 BPD:      84.9  mm     G. Age:  34w 1d         90  %    CI:        85.77   %    70 - 86
                                                         FL/HC:       20.4  %    19.1 -
 HC:      288.7  mm     G. Age:  31w 5d          7  %    HC/AC:       1.02       0.96 -
 AC:      282.8  mm     G. Age:  32w 2d         50  %    FL/BPD:      69.5  %    71 - 87
 FL:         59  mm     G. Age:  30w 5d          7  %    FL/AC:       20.9  %    20 - 24
 HUM:        55  mm     G. Age:  32w 0d         47  %

 Est. FW:    3994   gm     4 lb 2 oz     29  %
OB History

 Gravidity:    1
Gestational Age

 LMP:           32w 2d        Date:  07/13/18                 EDD:   04/19/19
 U/S Today:     32w 2d                                        EDD:   04/19/19
 Best:          32w 2d     Det. By:  LMP  (07/13/18)          EDD:   04/19/19
Anatomy

 Cranium:               Appears normal         Aortic Arch:            Previously seen
 Cavum:                 Previously seen        Ductal Arch:            Previously seen
 Ventricles:            Previously seen        Diaphragm:              Previously seen
 Choroid Plexus:        Previously seen        Stomach:                Appears normal, left
                                                                       sided
 Cerebellum:            Previously seen        Abdomen:                Previously seen
 Posterior Fossa:       Previously seen        Abdominal Wall:         Previously seen
 Nuchal Fold:           Previously seen        Cord Vessels:           Previously seen
 Face:                  Orbits and profile     Kidneys:                Appear normal
                        previously seen
 Lips:                  Previously seen        Bladder:                Appears normal
 Thoracic:              Appears normal         Spine:                  Previously seen
 Heart:                 Appears normal         Upper Extremities:      Previously seen
                        (4CH, axis, and
                        situs)
 RVOT:                  Previously seen        Lower Extremities:      Previously seen
 LVOT:                  Previously seen

 Other:  Fetus appears to be a male. Heels and LT 5th digit prev visualized.
Cervix Uterus Adnexa

 Cervix
 Not visualized (advanced GA >42wks)
Impression

 Normal interval growth.
 SLE
Recommendations

 Continue serial growth exams every 4 weeks.

## 2020-10-31 ENCOUNTER — Ambulatory Visit (INDEPENDENT_AMBULATORY_CARE_PROVIDER_SITE_OTHER): Payer: BC Managed Care – PPO

## 2020-10-31 ENCOUNTER — Other Ambulatory Visit: Payer: Self-pay

## 2020-10-31 VITALS — Wt 143.0 lb

## 2020-10-31 DIAGNOSIS — Z3042 Encounter for surveillance of injectable contraceptive: Secondary | ICD-10-CM

## 2020-10-31 MED ORDER — MEDROXYPROGESTERONE ACETATE 150 MG/ML IM SUSP
150.0000 mg | Freq: Once | INTRAMUSCULAR | Status: AC
Start: 2020-10-31 — End: 2020-10-31
  Administered 2020-10-31: 150 mg via INTRAMUSCULAR

## 2020-10-31 NOTE — Progress Notes (Addendum)
GYN presents for DEPO Injection, given in RUOQ, tolerated well. Last PAP 04/16/2019.  Needs Annual Exam  Next DEPO due May 23 to January 30, 2021   Administrations This Visit    medroxyPROGESTERone (DEPO-PROVERA) injection 150 mg    Admin Date 10/31/2020 Action Given Dose 150 mg Route Intramuscular Administered By Maretta Bees, RMA          Patient was assessed and managed by nursing staff during this encounter. I have reviewed the chart and agree with the documentation and plan. I have also made any necessary editorial changes.  Coral Ceo, MD 11/01/2020 8:14 AM

## 2020-11-23 ENCOUNTER — Ambulatory Visit: Payer: BC Managed Care – PPO | Admitting: Obstetrics

## 2021-01-17 ENCOUNTER — Other Ambulatory Visit: Payer: Self-pay

## 2021-01-17 ENCOUNTER — Encounter: Payer: Self-pay | Admitting: Emergency Medicine

## 2021-01-17 ENCOUNTER — Emergency Department
Admission: EM | Admit: 2021-01-17 | Discharge: 2021-01-17 | Disposition: A | Discharge status: Home / Self Care | Payer: BC Managed Care – PPO | Attending: Emergency Medicine | Admitting: Emergency Medicine

## 2021-01-17 DIAGNOSIS — F419 Anxiety disorder, unspecified: Secondary | ICD-10-CM | POA: Insufficient documentation

## 2021-01-17 DIAGNOSIS — L97322 Non-pressure chronic ulcer of left ankle with fat layer exposed: Secondary | ICD-10-CM | POA: Diagnosis not present

## 2021-01-17 DIAGNOSIS — Z79899 Other long term (current) drug therapy: Secondary | ICD-10-CM | POA: Diagnosis not present

## 2021-01-17 DIAGNOSIS — I10 Essential (primary) hypertension: Secondary | ICD-10-CM | POA: Insufficient documentation

## 2021-01-17 DIAGNOSIS — Z87891 Personal history of nicotine dependence: Secondary | ICD-10-CM | POA: Insufficient documentation

## 2021-01-17 DIAGNOSIS — G4700 Insomnia, unspecified: Secondary | ICD-10-CM | POA: Diagnosis not present

## 2021-01-17 LAB — CBC WITH DIFFERENTIAL/PLATELET
Abs Immature Granulocytes: 0.07 10*3/uL (ref 0.00–0.07)
Basophils Absolute: 0 10*3/uL (ref 0.0–0.1)
Basophils Relative: 0 %
Eosinophils Absolute: 0 10*3/uL (ref 0.0–0.5)
Eosinophils Relative: 1 %
HCT: 38.3 % (ref 36.0–46.0)
Hemoglobin: 12.9 g/dL (ref 12.0–15.0)
Immature Granulocytes: 1 %
Lymphocytes Relative: 23 %
Lymphs Abs: 1.2 10*3/uL (ref 0.7–4.0)
MCH: 29.9 pg (ref 26.0–34.0)
MCHC: 33.7 g/dL (ref 30.0–36.0)
MCV: 88.7 fL (ref 80.0–100.0)
Monocytes Absolute: 0.5 10*3/uL (ref 0.1–1.0)
Monocytes Relative: 10 %
Neutro Abs: 3.4 10*3/uL (ref 1.7–7.7)
Neutrophils Relative %: 65 %
Platelets: 245 10*3/uL (ref 150–400)
RBC: 4.32 MIL/uL (ref 3.87–5.11)
RDW: 14.1 % (ref 11.5–15.5)
WBC: 5.3 10*3/uL (ref 4.0–10.5)
nRBC: 0 % (ref 0.0–0.2)

## 2021-01-17 LAB — COMPREHENSIVE METABOLIC PANEL
ALT: 18 U/L (ref 0–44)
AST: 26 U/L (ref 15–41)
Albumin: 4 g/dL (ref 3.5–5.0)
Alkaline Phosphatase: 44 U/L (ref 38–126)
Anion gap: 12 (ref 5–15)
BUN: 11 mg/dL (ref 6–20)
CO2: 23 mmol/L (ref 22–32)
Calcium: 9.1 mg/dL (ref 8.9–10.3)
Chloride: 104 mmol/L (ref 98–111)
Creatinine, Ser: 0.64 mg/dL (ref 0.44–1.00)
GFR, Estimated: >60 mL/min (ref >60)
Glucose, Bld: 84 mg/dL (ref 70–99)
Potassium: 3.5 mmol/L (ref 3.5–5.1)
Sodium: 139 mmol/L (ref 135–145)
Total Bilirubin: 0.9 mg/dL (ref 0.3–1.2)
Total Protein: 8.2 g/dL — ABNORMAL HIGH (ref 6.5–8.1)

## 2021-01-17 LAB — PROTEIN / CREATININE RATIO, URINE
Creatinine, Urine: 82 mg/dL
Protein Creatinine Ratio: 0.29 mg/mg{Cre} — ABNORMAL HIGH (ref 0.00–0.15)
Total Protein, Urine: 24 mg/dL

## 2021-01-17 LAB — URINALYSIS, COMPLETE (UACMP) WITH MICROSCOPIC
Bilirubin Urine: NEGATIVE
Glucose, UA: NEGATIVE mg/dL
Hgb urine dipstick: NEGATIVE
Ketones, ur: NEGATIVE mg/dL
Leukocytes,Ua: NEGATIVE
Nitrite: NEGATIVE
Protein, ur: NEGATIVE mg/dL
Specific Gravity, Urine: 1.011 (ref 1.005–1.030)
pH: 7 (ref 5.0–8.0)

## 2021-01-17 LAB — POC URINE PREG, ED: Preg Test, Ur: NEGATIVE

## 2021-01-17 MED ORDER — HYDROXYZINE PAMOATE 100 MG PO CAPS: 100.0000 mg | ORAL_CAPSULE | Freq: Every day | ORAL | 0 refills | Status: DC

## 2021-01-17 NOTE — ED Notes (Signed)
Medications reviewed, followups reviewed. Wrapped wound. Pt alert and oriented X 4, stable for discharge. RR even and unlabored, color WNL. Discussed discharge instructions and follow-up as directed. Discharge medications discussed if prescribed. Pt had opportunity to ask questions, and RN to provide patient/family eduction.

## 2021-01-17 NOTE — ED Triage Notes (Signed)
States currently having a Lupus flare.  CO multiple open wounds to legs..  Left leg worse than right.  Also states has been unable to sleep for the past several months, only sleeping 2 hours a day.  Reports a lot of personal stress.  AAOx3.  Skin warm and dry. NAD

## 2021-01-17 NOTE — ED Provider Notes (Signed)
[facility] Emergency Department Provider Note   ____________________________________________   Event Date/Time   First MD Initiated Contact with Patient 01/17/21 1536     (approximate)  I have reviewed the triage vital signs and the nursing notes.   HISTORY  Chief Complaint No chief complaint on file.    HPI Kelly [name] is a 25 y.o. female with the below stated past medical history who presents for multiple complaints including multiple open wounds to the lower extremity on the medial ankles as well as insomnia and increased anxiety.  Patient states that she is scheduled to follow-up with her rheumatologist in the near future however she missed her opportunity to have labs drawn prior to this office visit.  Patient requests these labs to be drawn here in our emergency department.  Patient states that the wounds on bilateral ankles are recurrent for her over the last few years and resolved spontaneously with appropriate wound care.  Patient also states that she has had fewer and fewer hours of sleep recently and attributes this to increased stress in her life and anxiety.  Patient currently denies any vision changes, tinnitus, difficulty speaking, facial droop, sore throat, chest pain, shortness of breath, abdominal pain, nausea/vomiting/diarrhea, dysuria, or weakness/numbness/paresthesias in any extremity         Past Medical History:  Diagnosis Date  . Anxiety   . Chest discomfort feb 2017   Pt states due to shortness of breath  . Cough present for greater than 3 weeks   . Late Entry to [employer]- April 2020- Social Distancing  11/27/2018   Pt signed up for [employer]. Will come to office to pick up BP cuff.   . Lupus (systemic lupus erythematosus) (HCC)    dx 2010  . Pneumonia    2015  . Shortness of breath dyspnea feb 2017   changed inhaler with relief    Patient Active Problem List   Diagnosis Date Noted  . Pre-eclampsia, postpartum  03/31/2019  . Status post cesarean section 03/16/2019  . Gonorrhea affecting pregnancy in third trimester 03/15/2019  . Hydronephrosis of right kidney 02/24/2019  . Iron deficiency anemia during pregnancy, antepartum 10/14/2018  . Systemic lupus erythematosus (SLE) affecting pregnancy, antepartum (HCC) 06/04/2012    Past Surgical History:  Procedure Laterality Date  . BRONCHOSCOPY  11/2013  . CESAREAN SECTION N/A 03/16/2019   Procedure: CESAREAN SECTION;  Surgeon: Levie Heritage, DO;  Location: [facility] LD ORS;  Service: Obstetrics;  Laterality: N/A;  . WISDOM TOOTH EXTRACTION  2014    Prior to Admission medications   Medication Sig Start Date End Date Taking? Authorizing Provider  hydrOXYzine (VISTARIL) 100 MG capsule Take 1 capsule (100 mg total) by mouth at bedtime. 01/17/21  Yes Merwyn Katos, MD  acetaminophen (TYLENOL) 500 MG tablet Take 1,000 mg by mouth every 6 (six) hours as needed for moderate pain or headache.    [provider]  albuterol (VENTOLIN HFA) 108 (90 Base) MCG/ACT inhaler Ventolin HFA 90 mcg/actuation aerosol inhaler  INHALE 1 TO 2 PUFFS INTO THE LUNGS EVERY 6 HOURS AS NEEDED FOR WHEEZING OR SHORTNESS OF BREATH    [provider]  albuterol (VENTOLIN HFA) 108 (90 Base) MCG/ACT inhaler INHALE 1-2 PUFFS INTO THE LUNGS EVERY 6 (SIX) HOURS AS NEEDED FOR WHEEZING OR SHORTNESS OF BREATH. Patient not taking: Reported on 03/19/2019 01/26/19   Thressa Sheller D, CNM  enalapril (VASOTEC) 5 MG tablet Take 1 tablet (5 mg total) by mouth daily.  03/19/19   Mumaw, Hiram ComberElizabeth Woodland, DO  ferrous sulfate 325 (65 FE) MG tablet Take 1 tablet (325 mg total) by mouth 2 (two) times daily with a meal. 02/24/19 03/26/19  Nugent, Odie SeraNicole E, NP  ferrous sulfate 325 (65 FE) MG tablet Take 325 mg by mouth daily with breakfast.    [provider]  hydroxychloroquine (PLAQUENIL) 200 MG tablet Take 300 mg by mouth daily.    [provider]  medroxyPROGESTERone  (DEPO-PROVERA) 150 MG/ML injection Inject 1 mL (150 mg total) into the muscle every 3 (three) months. 10/24/20   Constant, Peggy, MD  Omeprazole (PRILOSEC PO) Take by mouth.    [provider]  oxyCODONE-acetaminophen (PERCOCET/ROXICET) 5-325 MG tablet Take 1-2 tablets by mouth every 4 (four) hours as needed for moderate pain. 03/18/19   Sharyon Cableogers, Veronica C, CNM  predniSONE (DELTASONE) 5 MG tablet Take 5 mg by mouth daily.  07/15/15   [provider]  tinidazole (TINDAMAX) 500 MG tablet Take 4 tablets (2,000 mg total) by mouth daily with breakfast. For two days 04/21/19   Sharyon Cableogers, Veronica C, CNM    Allergies Patient has no known allergies.  Family History  Problem Relation Age of Onset  . Diabetes Paternal Grandmother     Social History Social History   Tobacco Use  . Smoking status: Former Smoker    Types: Cigarettes  . Smokeless tobacco: Never Used  Vaping Use  . Vaping Use: Never used  Substance Use Topics  . Alcohol use: Not Currently  . Drug use: Not Currently    Types: Marijuana    Review of Systems Constitutional: No fever/chills Eyes: No visual changes. ENT: No sore throat. Cardiovascular: Denies chest pain. Respiratory: Denies shortness of breath. Gastrointestinal: No abdominal pain.  No nausea, no vomiting.  No diarrhea. Genitourinary: Negative for dysuria. Musculoskeletal: Negative for acute arthralgias Skin: Negative for rash.  Multiple ulcerated lesions to bilateral medial ankles Neurological: Negative for headaches, weakness/numbness/paresthesias in any extremity Psychiatric:  Positive for insomnia.  Negative for suicidal ideation/homicidal ideation   ____________________________________________   PHYSICAL EXAM:  VITAL SIGNS: ED Triage Vitals  Enc Vitals Group     BP 01/17/21 1329 (!) 139/104     Pulse Rate 01/17/21 1329 (!) 104     Resp 01/17/21 1329 16     Temp 01/17/21 1329 98.4 F (36.9 C)     Temp Source 01/17/21 1329 Oral      SpO2 01/17/21 1329 100 %     Weight 01/17/21 1326 143 lb 1.3 oz (64.9 kg)     Height 01/17/21 1326 5\' 2"  (1.575 m)     Head Circumference --      Peak Flow --      Pain Score 01/17/21 1326 0     Pain Loc --      Pain Edu? --      Excl. in GC? --    Constitutional: Alert and oriented. Well appearing and in no acute distress. Eyes: Conjunctivae are normal. PERRL. Head: Atraumatic. Nose: No congestion/rhinnorhea. Mouth/Throat: Mucous membranes are moist. Neck: No stridor Cardiovascular: Grossly normal heart sounds.  Good peripheral circulation. Respiratory: Normal respiratory effort.  No retractions. Gastrointestinal: Soft and nontender. No distention. Musculoskeletal: No obvious deformities Neurologic:  Normal speech and language. No gross focal neurologic deficits are appreciated. Skin:  Skin is warm and dry.  2 areas of 2 cm diameter ulceration to the left medial distal ankle without any surrounding erythema or induration and slight tenderness to palpation.  1 area of 1 cm diameter ulceration to the right distal medial ankle without any surrounding erythema, induration, or tenderness to palpation Psychiatric: Mood and affect are normal. Speech and behavior are normal.  ____________________________________________   LABS (all labs ordered are listed, but only abnormal results are displayed)  Labs Reviewed  COMPREHENSIVE METABOLIC PANEL - Abnormal; Notable for the following components:      Result Value   Total Protein 8.2 (*)    All other components within normal limits  URINALYSIS, COMPLETE (UACMP) WITH MICROSCOPIC - Abnormal; Notable for the following components:   Color, Urine YELLOW (*)    APPearance HAZY (*)    Bacteria, UA RARE (*)    All other components within normal limits  PROTEIN / CREATININE RATIO, URINE - Abnormal; Notable for the following components:   Protein Creatinine Ratio 0.29 (*)    All other components within normal limits  CBC WITH DIFFERENTIAL/PLATELET   ANTI-DNA ANTIBODY, DOUBLE-STRANDED  C3 COMPLEMENT  C4 COMPLEMENT  MYCOPHENOLIC ACID (CELLCEPT)  POC URINE PREG, ED    PROCEDURES  Procedure(s) performed (including Critical Care):  .1-3 Lead EKG Interpretation Performed by: Merwyn Katos, MD Authorized by: Merwyn Katos, MD     Interpretation: normal     ECG rate:  80   ECG rate assessment: normal     Rhythm: sinus rhythm     Ectopy: none     Conduction: normal       ____________________________________________   INITIAL IMPRESSION / ASSESSMENT AND PLAN / ED COURSE  As part of my medical decision making, I reviewed the following data within the electronic MEDICAL RECORD NUMBER Nursing notes reviewed and incorporated, Labs reviewed, EKG interpreted, Old chart reviewed, Radiograph reviewed and Notes from prior ED visits reviewed and incorporated        Patient is a 25 year old female with a past medical history of lupus who presents for multiple complaints including bilateral ankle ulcers, insomnia, and anxiety.  Patient's history, review of systems, and physical exam do not show any evidence of red flag symptomatology at this time.  Patient's wounds have no evidence of infection and are minimally tender to palpation.  Provided patient with follow-up with wound care, a prescription for Vistaril for sleep as well as follow-up with [facility] mental health, and drew all labs listed in a 12/26/2020 note from rheumatology.  The patient has been reexamined and is ready to be discharged.  All diagnostic results have been reviewed and discussed with the patient/family.  Care plan has been outlined and the patient/family understands all current diagnoses, results, and treatment plans.  There are no new complaints, changes, or physical findings at this time.  All questions have been addressed and answered.  [All medications, if any, that were given while in the emergency department or any that are being prescribed have been reviewed with the  patient/family.  All side effects and adverse reactions have been explained.]  Patient was instructed to, and agrees to follow-up with their primary care physician as well as return to the emergency department if any new or worsening symptoms develop.      ____________________________________________   FINAL CLINICAL IMPRESSION(S) / ED DIAGNOSES  Final diagnoses:  Insomnia, unspecified type  Ankle ulcer, left, with fat layer exposed (HCC)  Primary hypertension  Anxiety     ED Discharge Orders         Ordered    hydrOXYzine (VISTARIL) 100 MG capsule  Daily at bedtime  01/17/21 1609           Note:  This document was prepared using Dragon voice recognition software and may include unintentional dictation errors.   Merwyn Katos, MD 01/17/21 2045

## 2021-01-17 NOTE — ED Notes (Addendum)
Sent two large red tops,2 SST,blue,green,and purple top tubes to lab.

## 2021-01-18 ENCOUNTER — Other Ambulatory Visit: Payer: Self-pay

## 2021-01-18 ENCOUNTER — Ambulatory Visit (INDEPENDENT_AMBULATORY_CARE_PROVIDER_SITE_OTHER): Payer: BC Managed Care – PPO

## 2021-01-18 DIAGNOSIS — Z3042 Encounter for surveillance of injectable contraceptive: Secondary | ICD-10-CM | POA: Diagnosis not present

## 2021-01-18 LAB — C3 COMPLEMENT: C3 Complement: 93 mg/dL (ref 82–167)

## 2021-01-18 LAB — C4 COMPLEMENT: Complement C4, Body Fluid: 14 mg/dL (ref 12–38)

## 2021-01-18 LAB — MYCOPHENOLIC ACID (CELLCEPT)
MPA Glucuronide: 20 ug/mL (ref 15–125)
MPA: 2.2 ug/mL (ref 1.0–3.5)

## 2021-01-18 LAB — ANTI-DNA ANTIBODY, DOUBLE-STRANDED: ds DNA Ab: 159 IU/mL — ABNORMAL HIGH (ref 0–9)

## 2021-01-18 MED ORDER — MEDROXYPROGESTERONE ACETATE 150 MG/ML IM SUSP
150.0000 mg | INTRAMUSCULAR | Status: DC
  Administered 2021-01-18 – 2021-07-12 (×3): 150 mg via INTRAMUSCULAR

## 2021-01-18 NOTE — Progress Notes (Signed)
Agree with A & P. 

## 2021-01-18 NOTE — Progress Notes (Signed)
Pt is in the office for depo injection, administered in LUOQ and pt tolerated well. Next depo due Aug 10-24. .. Administrations This Visit    medroxyPROGESTERone (DEPO-PROVERA) injection 150 mg    Admin Date 01/18/2021 Action Given Dose 150 mg Route Intramuscular Administered By Katrina Stack, RN

## 2021-01-19 ENCOUNTER — Ambulatory Visit: Payer: BC Managed Care – PPO

## 2021-01-20 ENCOUNTER — Other Ambulatory Visit: Payer: Self-pay | Admitting: Obstetrics and Gynecology

## 2021-01-20 DIAGNOSIS — Z30013 Encounter for initial prescription of injectable contraceptive: Secondary | ICD-10-CM

## 2021-02-08 ENCOUNTER — Emergency Department (HOSPITAL_COMMUNITY)
Admission: EM | Admit: 2021-02-08 | Discharge: 2021-02-08 | Disposition: A | Discharge status: Home / Self Care | Payer: BC Managed Care – PPO | Attending: Emergency Medicine | Admitting: Emergency Medicine

## 2021-02-08 ENCOUNTER — Emergency Department (HOSPITAL_COMMUNITY): Payer: BC Managed Care – PPO

## 2021-02-08 ENCOUNTER — Emergency Department (HOSPITAL_BASED_OUTPATIENT_CLINIC_OR_DEPARTMENT_OTHER): Payer: BC Managed Care – PPO

## 2021-02-08 ENCOUNTER — Encounter (HOSPITAL_COMMUNITY): Payer: Self-pay | Admitting: Pharmacy Technician

## 2021-02-08 DIAGNOSIS — L0390 Cellulitis, unspecified: Secondary | ICD-10-CM

## 2021-02-08 DIAGNOSIS — L97919 Non-pressure chronic ulcer of unspecified part of right lower leg with unspecified severity: Secondary | ICD-10-CM

## 2021-02-08 DIAGNOSIS — M7989 Other specified soft tissue disorders: Secondary | ICD-10-CM | POA: Diagnosis not present

## 2021-02-08 DIAGNOSIS — Z87891 Personal history of nicotine dependence: Secondary | ICD-10-CM | POA: Insufficient documentation

## 2021-02-08 DIAGNOSIS — L97929 Non-pressure chronic ulcer of unspecified part of left lower leg with unspecified severity: Secondary | ICD-10-CM | POA: Insufficient documentation

## 2021-02-08 DIAGNOSIS — M79605 Pain in left leg: Secondary | ICD-10-CM | POA: Diagnosis not present

## 2021-02-08 DIAGNOSIS — M79604 Pain in right leg: Secondary | ICD-10-CM | POA: Diagnosis present

## 2021-02-08 LAB — CBC WITH DIFFERENTIAL/PLATELET
Abs Immature Granulocytes: 0.04 10*3/uL (ref 0.00–0.07)
Basophils Absolute: 0 10*3/uL (ref 0.0–0.1)
Basophils Relative: 0 %
Eosinophils Absolute: 0.1 10*3/uL (ref 0.0–0.5)
Eosinophils Relative: 1 %
HCT: 37.9 % (ref 36.0–46.0)
Hemoglobin: 12.5 g/dL (ref 12.0–15.0)
Immature Granulocytes: 1 %
Lymphocytes Relative: 22 %
Lymphs Abs: 1.1 10*3/uL (ref 0.7–4.0)
MCH: 29.8 pg (ref 26.0–34.0)
MCHC: 33 g/dL (ref 30.0–36.0)
MCV: 90.5 fL (ref 80.0–100.0)
Monocytes Absolute: 0.6 10*3/uL (ref 0.1–1.0)
Monocytes Relative: 12 %
Neutro Abs: 3.2 10*3/uL (ref 1.7–7.7)
Neutrophils Relative %: 64 %
Platelets: 255 10*3/uL (ref 150–400)
RBC: 4.19 MIL/uL (ref 3.87–5.11)
RDW: 15.2 % (ref 11.5–15.5)
WBC: 5 10*3/uL (ref 4.0–10.5)
nRBC: 0 % (ref 0.0–0.2)

## 2021-02-08 LAB — LACTIC ACID, PLASMA
Lactic Acid, Venous: 1.3 mmol/L (ref 0.5–1.9)
Lactic Acid, Venous: 1.2 mmol/L (ref 0.5–1.9)

## 2021-02-08 LAB — BASIC METABOLIC PANEL
Anion gap: 10 (ref 5–15)
BUN: 11 mg/dL (ref 6–20)
CO2: 23 mmol/L (ref 22–32)
Calcium: 8.8 mg/dL — ABNORMAL LOW (ref 8.9–10.3)
Chloride: 106 mmol/L (ref 98–111)
Creatinine, Ser: 0.65 mg/dL (ref 0.44–1.00)
GFR, Estimated: >60 mL/min (ref >60)
Glucose, Bld: 89 mg/dL (ref 70–99)
Potassium: 3.8 mmol/L (ref 3.5–5.1)
Sodium: 139 mmol/L (ref 135–145)

## 2021-02-08 MED ORDER — HYDROCODONE-ACETAMINOPHEN 5-325 MG PO TABS: 1.0000 | ORAL_TABLET | ORAL | 0 refills | Status: DC | PRN

## 2021-02-08 MED ORDER — HYDROCODONE-ACETAMINOPHEN 5-325 MG PO TABS
2.0000 | ORAL_TABLET | Freq: Once | ORAL | Status: AC
  Administered 2021-02-08: 2 via ORAL
  Filled 2021-02-08: qty 2

## 2021-02-08 MED ORDER — DOXYCYCLINE HYCLATE 100 MG PO CAPS: 100.0000 mg | ORAL_CAPSULE | Freq: Two times a day (BID) | ORAL | 0 refills | Status: DC

## 2021-02-08 MED ORDER — SODIUM CHLORIDE 0.9 % IV SOLN
1.0000 g | INTRAVENOUS | Status: DC
  Administered 2021-02-08: 1 g via INTRAVENOUS
  Filled 2021-02-08: qty 10

## 2021-02-08 NOTE — Progress Notes (Signed)
LLE venous duplex has been completed.  Preliminary results given to [facility], RN.   Results can be found under chart review under CV PROC. 02/08/2021 7:12 PM Ixel Boehning RVT, RDMS

## 2021-02-08 NOTE — ED Provider Notes (Signed)
Emergency Medicine Provider Triage Evaluation Note  Kelly [name] , a 25 y.o. female  was evaluated in triage.  Pt complains of ulcers to medial left ankle that have been worsening over the past month. No fever or chills. She also endorses left foot edema and calf pain. Patient was advised by her rheumatologist to report to the ED to obtain an [place] to rule out DVT and to evaluate wounds. Patient has a history of lupus.   Review of Systems  Positive: Wound, lower extremity edema Negative: fever  Physical Exam  BP (!) 130/94 (BP Location: Left Arm)   Pulse 100   Temp 98.6 F (37 C) (Oral)   Resp 18   SpO2 100%  Gen:   Awake, no distress   Resp:  Normal effort  MSK:   Moves extremities without difficulty  Other:    Medical Decision Making  Medically screening exam initiated at 5:57 PM.  Appropriate orders placed.  BLANCHIE ZELEZNIK was informed that the remainder of the evaluation will be completed by another provider, this initial triage assessment does not replace that evaluation, and the importance of remaining in the ED until their evaluation is complete.  Labs ordered. X-rays to rule out signs of osteomyelitis. [place] to rule out DVT.    Mannie Stabile, PA-C 02/08/21 1759    Linwood Dibbles, MD 02/09/21 [phone]

## 2021-02-08 NOTE — ED Notes (Signed)
Pt verbalizes understanding of discharge instructions. Opportunity for questions and answers were provided. Pt discharged from the ED.   ?

## 2021-02-08 NOTE — Discharge Instructions (Addendum)
Follow up with wound care as scheduled.  See your Physician for recheck.

## 2021-02-08 NOTE — ED Provider Notes (Signed)
[facility] EMERGENCY DEPARTMENT Provider Note   CSN: [id number] Arrival date & time: 02/08/21  1651     History No chief complaint on file.   Kelly [name] is a 25 y.o. female.  Pt reports she has lupus and gets sores on her legs.  Pt reports she has an appointment next week with wound care but wounds are starting to look infected.  Pt denies fever or cahills   The history is provided by the patient. No language interpreter was used.  Leg Pain Location:  Leg Time since incident:  4 weeks Injury: no   Leg location:  L leg Pain details:    Quality:  Aching   Radiates to:  Does not radiate   Severity:  Moderate   Timing:  Constant   Progression:  Worsening Chronicity:  Recurrent Dislocation: no   Relieved by:  Nothing Worsened by:  Nothing Ineffective treatments:  None tried Associated symptoms: no fever   Risk factors: concern for non-accidental trauma       Past Medical History:  Diagnosis Date   Anxiety    Chest discomfort feb 2017   Pt states due to shortness of breath   Cough present for greater than 3 weeks    Late Entry to [employer]- April 2020- Social Distancing  11/27/2018   Pt signed up for [employer]. Will come to office to pick up BP cuff.    Lupus (systemic lupus erythematosus) (HCC)    dx 2010   Pneumonia    2015   Shortness of breath dyspnea feb 2017   changed inhaler with relief    Patient Active Problem List   Diagnosis Date Noted   Pre-eclampsia, postpartum 03/31/2019   Status post cesarean section 03/16/2019   Gonorrhea affecting pregnancy in third trimester 03/15/2019   Hydronephrosis of right kidney 02/24/2019   Iron deficiency anemia during pregnancy, antepartum 10/14/2018   Systemic lupus erythematosus (SLE) affecting pregnancy, antepartum (HCC) 06/04/2012    Past Surgical History:  Procedure Laterality Date   BRONCHOSCOPY  11/2013   CESAREAN SECTION N/A 03/16/2019   Procedure: CESAREAN SECTION;  Surgeon:  Levie Heritage, DO;  Location: [facility] LD ORS;  Service: Obstetrics;  Laterality: N/A;   WISDOM TOOTH EXTRACTION  2014     OB History     Gravida  1   Para  1   Term      Preterm  1   AB      Living  1      SAB      IAB      Ectopic      Multiple  0   Live Births  1           Family History  Problem Relation Age of Onset   Diabetes Paternal Grandmother     Social History   Tobacco Use   Smoking status: Former    Pack years: 0.00    Types: Cigarettes   Smokeless tobacco: Never  Vaping Use   Vaping Use: Never used  Substance Use Topics   Alcohol use: Not Currently   Drug use: Not Currently    Types: Marijuana    Home Medications Prior to Admission medications   Medication Sig Start Date End Date Taking? Authorizing Provider  doxycycline (VIBRAMYCIN) 100 MG capsule Take 1 capsule (100 mg total) by mouth 2 (two) times daily. 02/08/21  Yes Elson Areas, PA-C  HYDROcodone-acetaminophen (NORCO/VICODIN) 5-325 MG tablet Take 1 tablet by mouth  every 4 (four) hours as needed for moderate pain. 02/08/21 02/08/22 Yes Elson Areas, PA-C  acetaminophen (TYLENOL) 500 MG tablet Take 1,000 mg by mouth every 6 (six) hours as needed for moderate pain or headache.    [provider]  albuterol (VENTOLIN HFA) 108 (90 Base) MCG/ACT inhaler Ventolin HFA 90 mcg/actuation aerosol inhaler  INHALE 1 TO 2 PUFFS INTO THE LUNGS EVERY 6 HOURS AS NEEDED FOR WHEEZING OR SHORTNESS OF BREATH    [provider]  albuterol (VENTOLIN HFA) 108 (90 Base) MCG/ACT inhaler INHALE 1-2 PUFFS INTO THE LUNGS EVERY 6 (SIX) HOURS AS NEEDED FOR WHEEZING OR SHORTNESS OF BREATH. Patient not taking: No sig reported 01/26/19   Thressa Sheller D, CNM  enalapril (VASOTEC) 5 MG tablet Take 1 tablet (5 mg total) by mouth daily. 03/19/19   Mumaw, Hiram Comber, DO  ferrous sulfate 325 (65 FE) MG tablet Take 1 tablet (325 mg total) by mouth 2 (two) times daily with a meal. 02/24/19 03/26/19   Nugent, Odie Sera, NP  ferrous sulfate 325 (65 FE) MG tablet Take 325 mg by mouth daily with breakfast.    [provider]  hydroxychloroquine (PLAQUENIL) 200 MG tablet Take 300 mg by mouth daily.    [provider]  hydrOXYzine (VISTARIL) 100 MG capsule Take 1 capsule (100 mg total) by mouth at bedtime. 01/17/21   Merwyn Katos, MD  medroxyPROGESTERone (DEPO-PROVERA) 150 MG/ML injection Inject 1 mL (150 mg total) into the muscle every 3 (three) months. 10/24/20   Constant, Peggy, MD  mycophenolate (CELLCEPT) 250 MG capsule Take by mouth 2 (two) times daily.    [provider]  Omeprazole (PRILOSEC PO) Take by mouth.    [provider]  oxyCODONE-acetaminophen (PERCOCET/ROXICET) 5-325 MG tablet Take 1-2 tablets by mouth every 4 (four) hours as needed for moderate pain. 03/18/19   Sharyon Cable, CNM  predniSONE (DELTASONE) 5 MG tablet Take 5 mg by mouth daily.  07/15/15   [provider]  tinidazole (TINDAMAX) 500 MG tablet Take 4 tablets (2,000 mg total) by mouth daily with breakfast. For two days 04/21/19   Sharyon Cable, CNM    Allergies    Patient has no known allergies.  Review of Systems   Review of Systems  Constitutional:  Negative for fever.  All other systems reviewed and are negative.  Physical Exam Updated Vital Signs BP (!) 129/98   Pulse 97   Temp 98 F (36.7 C)   Resp 18   SpO2 100%   Physical Exam Vitals reviewed.  Cardiovascular:     Rate and Rhythm: Normal rate.  Pulmonary:     Effort: Pulmonary effort is normal.  Abdominal:     General: Abdomen is flat.  Musculoskeletal:        General: Normal range of motion.  Skin:    General: Skin is warm.  Neurological:     General: No focal deficit present.     Mental Status: She is alert.  Psychiatric:        Mood and Affect: Mood normal.    ED Results / Procedures / Treatments   Labs (all labs ordered are listed, but only abnormal results are  displayed) Labs Reviewed  BASIC METABOLIC PANEL - Abnormal; Notable for the following components:      Result Value   Calcium 8.8 (*)    All other components within normal limits  CBC WITH DIFFERENTIAL/PLATELET  LACTIC ACID, PLASMA  LACTIC ACID, PLASMA  EKG None  Radiology DG Ankle Complete Left  Result Date: 02/08/2021 CLINICAL DATA:  Ulcer EXAM: LEFT ANKLE COMPLETE - 3+ VIEW COMPARISON:  None. FINDINGS: No fracture or malalignment. Ankle mortise is symmetric. Diffuse soft tissue swelling. Lucencies along the posterior, medial lower leg presumably due to history of wounds and ulcer. Diffuse soft tissue calcification IMPRESSION: 1. No acute osseous abnormality 2. Diffuse soft tissue swelling with presumed ulcers or wounds along the medial, posterior aspect of the distal lower leg Electronically Signed   By: Jasmine PangKim  Fujinaga M.D.   On: 02/08/2021 18:34   DG Foot Complete Left  Result Date: 02/08/2021 CLINICAL DATA:  Leg wounds EXAM: LEFT FOOT - COMPLETE 3+ VIEW COMPARISON:  None. FINDINGS: No fracture or malalignment. No periostitis or bone destruction. Dorsal soft tissue swelling. IMPRESSION: No acute osseous abnormality Electronically Signed   By: Jasmine PangKim  Fujinaga M.D.   On: 02/08/2021 18:35   VAS US LOWER EXTREMITY VENOUS (DVT) ([facility] and [facility] 7a-7p)  Result Date: 02/08/2021  Lower Venous DVT Study Patient Name:  Cresenciano [facility]H [facility]  Date of Exam:   02/08/2021 Medical Rec #: [id number]           Accession #:    [id number] Date of Birth: 11-29-1995           Patient Gender: F Patient Age:   [id number] [facility] Procedure:      VAS US LOWER EXTREMITY VENOUS (DVT) Referring Phys: [id number] Mannie StabileAROLINE C ABERMAN --------------------------------------------------------------------------------  Indications: Ulceration, Swelling, and Pain.  Risk Factors: Lupus. Limitations: Poor ultrasound/tissue interface. Comparison Study: no previous exams Performing Technologist: Jody Hill RVT, RDMS   Examination Guidelines: A complete evaluation includes B-mode imaging, spectral Doppler, color Doppler, and power Doppler as needed of all accessible portions of each vessel. Bilateral testing is considered an integral part of a complete examination. Limited examinations for reoccurring indications may be performed as noted. The reflux portion of the exam is performed with the patient in reverse Trendelenburg.  +-----+---------------+---------+-----------+----------+--------------+ RIGHTCompressibilityPhasicitySpontaneityPropertiesThrombus Aging +-----+---------------+---------+-----------+----------+--------------+ CFV  Full           Yes      Yes                                 +-----+---------------+---------+-----------+----------+--------------+   +---------+---------------+---------+-----------+----------+--------------+ LEFT     CompressibilityPhasicitySpontaneityPropertiesThrombus Aging +---------+---------------+---------+-----------+----------+--------------+ CFV      Full           Yes      Yes                                 +---------+---------------+---------+-----------+----------+--------------+ SFJ      Full                                                        +---------+---------------+---------+-----------+----------+--------------+ FV Prox  Full           Yes      Yes                                 +---------+---------------+---------+-----------+----------+--------------+ FV Mid   Full  Yes      Yes                                 +---------+---------------+---------+-----------+----------+--------------+ FV DistalFull           Yes      Yes                                 +---------+---------------+---------+-----------+----------+--------------+ PFV      Full                                                        +---------+---------------+---------+-----------+----------+--------------+ POP      Full           Yes       Yes                                 +---------+---------------+---------+-----------+----------+--------------+ PTV      Full                                                        +---------+---------------+---------+-----------+----------+--------------+ PERO     Full                                                        +---------+---------------+---------+-----------+----------+--------------+    *See table(s) above for measurements and observations. Electronically signed by Coral Else MD on 02/08/2021 at 7:52:07 PM.    Final     Procedures Procedures   Medications Ordered in ED Medications  cefTRIAXone (ROCEPHIN) 1 g in sodium chloride 0.9 % 100 mL IVPB (0 g Intravenous Stopped 02/08/21 2315)  HYDROcodone-acetaminophen (NORCO/VICODIN) 5-325 MG per tablet 2 tablet (2 tablets Oral Given 02/08/21 2131)    ED Course  I have reviewed the triage vital signs and the nursing notes.  Pertinent labs & imaging results that were available during my care of the patient were reviewed by me and considered in my medical decision making (see chart for details).    MDM Rules/Calculators/A&P                          MDM doppler  no dvt.  Xray no osteo.  Pt given injection of rocephin,  Rx for hydrocodone and doxycycline   Final Clinical Impression(s) / ED Diagnoses Final diagnoses:  Ulcers of both lower legs (HCC)    Rx / DC Orders ED Discharge Orders          Ordered    doxycycline (VIBRAMYCIN) 100 MG capsule  2 times daily        02/08/21 2226    HYDROcodone-acetaminophen (NORCO/VICODIN) 5-325 MG tablet  Every 4 hours PRN        02/08/21 2226          An After Visit  Summary was printed and given to the patient.    Osie Cheeks 02/09/21 2256    Lorre Nick, MD 02/10/21 [phone]

## 2021-02-08 NOTE — ED Triage Notes (Signed)
Pt here pov with reports of wounds to L leg. Pt also endorses swelling to L foot. Pt sent her for [place] to rule out blood clot. Pt tearful in triage.

## 2021-02-13 ENCOUNTER — Encounter (HOSPITAL_BASED_OUTPATIENT_CLINIC_OR_DEPARTMENT_OTHER): Payer: BC Managed Care – PPO | Attending: Internal Medicine | Admitting: Internal Medicine

## 2021-02-13 ENCOUNTER — Other Ambulatory Visit: Payer: Self-pay

## 2021-02-13 DIAGNOSIS — L97812 Non-pressure chronic ulcer of other part of right lower leg with fat layer exposed: Secondary | ICD-10-CM | POA: Diagnosis not present

## 2021-02-13 DIAGNOSIS — M329 Systemic lupus erythematosus, unspecified: Secondary | ICD-10-CM | POA: Insufficient documentation

## 2021-02-13 DIAGNOSIS — L97829 Non-pressure chronic ulcer of other part of left lower leg with unspecified severity: Secondary | ICD-10-CM

## 2021-02-13 DIAGNOSIS — L97819 Non-pressure chronic ulcer of other part of right lower leg with unspecified severity: Secondary | ICD-10-CM | POA: Diagnosis not present

## 2021-02-13 DIAGNOSIS — Z7952 Long term (current) use of systemic steroids: Secondary | ICD-10-CM | POA: Diagnosis not present

## 2021-02-13 DIAGNOSIS — Z79899 Other long term (current) drug therapy: Secondary | ICD-10-CM | POA: Diagnosis not present

## 2021-02-13 NOTE — Progress Notes (Signed)
Kelly [name], Kelly [name] ([id number]) Visit Report for 02/13/2021 Abuse/Suicide Risk Screen Details Patient Name: Date of Service: Kelly [name], Kelly [name]. 02/13/2021 1:15 PM Medical Record Number: [id number] Patient Account Number: [id number] Date of Birth/Sex: Treating RN: 06-12-1996 (24 y.o. [name] Primary Care Shaniquia Brafford: PA [facility] MES Other Clinician: Referring Iyan Flett: Treating Sennie Borden/Extender: Geralyn Corwin PA RTRIDGE, JA MES Weeks in Treatment: 0 Abuse/Suicide Risk Screen Items Answer ABUSE RISK SCREEN: Has anyone close to you tried to hurt or harm you recentlyo No Do you feel uncomfortable with anyone in your familyo No Has anyone forced you do things that you didnt want to doo No Electronic Signature(s) Signed: 02/13/2021 6:31:05 PM By: Antonieta Iba Entered By: Antonieta Iba on 02/13/2021 13:41:36 -------------------------------------------------------------------------------- Activities of Daily Living Details Patient Name: Date of Service: Kelly [name], Kelly [name] 02/13/2021 1:15 PM Medical Record Number: [id number] Patient Account Number: [id number] Date of Birth/Sex: Treating RN: [birth year] (24 y.o. [name] Primary Care Gurman Ashland: PA [facility] MES Other Clinician: Referring Munachimso Rigdon: Treating Eytan Carrigan/Extender: Geralyn Corwin PA RTRIDGE, JA MES Weeks in Treatment: 0 Activities of Daily Living Items Answer Activities of Daily Living (Please select one for each item) Drive Automobile Completely Able T Medications ake Completely Able Use T elephone Completely Able Care for Appearance Completely Able Use T oilet Completely Able Bath / Shower Completely Able Dress Self Completely Able Feed Self Completely Able Walk Completely Able Get In / Out Bed Completely Able Housework Completely Able Prepare Meals Completely Able Handle Money Completely Able Shop for Self Completely Able Electronic Signature(s) Signed: 02/13/2021 6:31:05 PM By:  Antonieta Iba Entered By: Antonieta Iba on 02/13/2021 13:41:53 -------------------------------------------------------------------------------- Education Screening Details Patient Name: Date of Service: Kelly Milling RIA Rexene Edison M. 02/13/2021 1:15 PM Medical Record Number: [id number] Patient Account Number: [id number] Date of Birth/Sex: Treating RN: Dec 04, 1995 (24 y.o. [name] Primary Care Rica Heather: PA [facility] MES Other Clinician: Referring Daxten Kovalenko: Treating Arizona Sorn/Extender: Geralyn Corwin PA RTRIDGE, JA MES Weeks in Treatment: 0 Primary Learner Assessed: Patient Learning Preferences/Education Level/Primary Language Learning Preference: Explanation, Demonstration, Printed Material Highest Education Level: College or Above Preferred Language: Economist Language Barrier: No Translator Needed: No Memory Deficit: No Emotional Barrier: No Cultural/Religious Beliefs Affecting Medical Care: No Physical Barrier Impaired Vision: Yes Contacts Impaired Hearing: No Decreased Hand dexterity: No Knowledge/Comprehension Knowledge Level: High Comprehension Level: High Ability to understand written instructions: High Ability to understand verbal instructions: High Motivation Anxiety Level: Calm Cooperation: Cooperative Education Importance: Acknowledges Need Interest in Health Problems: Asks Questions Perception: Coherent Willingness to Engage in Self-Management High Activities: Readiness to Engage in Self-Management High Activities: Electronic Signature(s) Signed: 02/13/2021 6:31:05 PM By: Antonieta Iba Entered By: Antonieta Iba on 02/13/2021 13:42:21 -------------------------------------------------------------------------------- Fall Risk Assessment Details Patient Name: Date of Service: Kelly Milling [name] 02/13/2021 1:15 PM Medical Record Number: [id number] Patient Account Number: [id number] Date of Birth/Sex: Treating RN: 1996/08/25 (24 y.o.  [name] Primary Care Willem Klingensmith: PA [facility] MES Other Clinician: Referring Carzell Saldivar: Treating Savon Bordonaro/Extender: Geralyn Corwin PA RTRIDGE, JA MES Weeks in Treatment: 0 Fall Risk Assessment Items Have you had 2 or more falls in the last 12 monthso 0 No Have you had any fall that resulted in injury in the last 12 monthso 0 No FALLS RISK SCREEN History of falling - immediate or within 3 months 0 No Secondary diagnosis (Do you have 2 or more medical diagnoseso) 0 No Ambulatory aid None/bed rest/wheelchair/nurse 0 Yes Crutches/cane/walker 0 No Furniture 0 No Intravenous  therapy Access/Saline/Heparin Lock 0 No Gait/Transferring Normal/ bed rest/ wheelchair 0 Yes Weak (short steps with or without shuffle, stooped but able to lift head while walking, may seek 0 No support from furniture) Impaired (short steps with shuffle, may have difficulty arising from chair, head down, impaired 0 No balance) Mental Status Oriented to own ability 0 Yes Electronic Signature(s) Signed: 02/13/2021 6:31:05 PM By: Antonieta Iba Entered By: Antonieta Iba on 02/13/2021 13:42:32 -------------------------------------------------------------------------------- Foot Assessment Details Patient Name: Date of Service: Kelly Milling RIA Rexene Edison M. 02/13/2021 1:15 PM Medical Record Number: [id number] Patient Account Number: [id number] Date of Birth/Sex: Treating RN: Jun 18, 1996 (24 y.o. [name] Primary Care Kohen Reither: PA [facility] MES Other Clinician: Referring Trevonte Ashkar: Treating Shawnita Krizek/Extender: Geralyn Corwin PA RTRIDGE, JA MES Weeks in Treatment: 0 Foot Assessment Items Site Locations + = Sensation present, - = Sensation absent, C = Callus, U = Ulcer R = Redness, W = Warmth, M = Maceration, PU = Pre-ulcerative lesion F = Fissure, S = Swelling, D = Dryness Assessment Right: Left: Other Deformity: No No Prior Foot Ulcer: No No Prior Amputation: No No Charcot Joint: No  No Ambulatory Status: Ambulatory Without Help Gait: Steady Electronic Signature(s) Signed: 02/13/2021 6:31:05 PM By: Antonieta Iba Entered By: Antonieta Iba on 02/13/2021 13:58:25 -------------------------------------------------------------------------------- Nutrition Risk Screening Details Patient Name: Date of Service: Kelly [name], Kelly [name] 02/13/2021 1:15 PM Medical Record Number: [id number] Patient Account Number: [id number] Date of Birth/Sex: Treating RN: Apr 03, 1996 (24 y.o. [name] Primary Care Yue Glasheen: PA [facility] MES Other Clinician: Referring Sunni Richardson: Treating Standley Bargo/Extender: Geralyn Corwin PA RTRIDGE, JA MES Weeks in Treatment: 0 Height (in): 62 Weight (lbs): Body Mass Index (BMI): Nutrition Risk Screening Items Score Screening NUTRITION RISK SCREEN: I have an illness or condition that made me change the kind and/or amount of food I eat 0 No I eat fewer than two meals per day 0 No I eat few fruits and vegetables, or milk products 0 No I have three or more drinks of beer, liquor or wine almost every day 0 No I have tooth or mouth problems that make it hard for me to eat 0 No I don't always have enough money to buy the food I need 0 No I eat alone most of the time 0 No I take three or more different prescribed or over-the-counter drugs a day 1 Yes Without wanting to, I have lost or gained 10 pounds in the last six months 0 No I am not always physically able to shop, cook and/or feed myself 0 No Nutrition Protocols Good Risk Protocol 0 No interventions needed Moderate Risk Protocol High Risk Proctocol Risk Level: Good Risk Score: 1 Electronic Signature(s) Signed: 02/13/2021 6:31:05 PM By: Antonieta Iba Entered By: Antonieta Iba on 02/13/2021 13:42:43

## 2021-02-14 NOTE — Progress Notes (Signed)
Kelly, [name] ([id number]) Visit Report for 02/13/2021 Chief Complaint Document Details Patient Name: Date of Service: Kelly, [name] 02/13/2021 1:15 PM Medical Record Number: [id number] Patient Account Number: [id number] Date of Birth/Sex: Treating RN: February 21, 1996 (24 y.o. [name] Primary Care Provider: PA Farrell Ours MES Other Clinician: Referring Provider: Treating Provider/Extender: Geralyn Corwin PA RTRIDGE, JA MES Weeks in Treatment: 0 Information Obtained from: Patient Chief Complaint Bilateral lower extremity wounds Electronic Signature(s) Signed: 02/14/2021 12:29:53 PM By: Geralyn Corwin DO Entered By: Geralyn Corwin on 02/14/2021 11:30:40 -------------------------------------------------------------------------------- Debridement Details Patient Name: Date of Service: Kelly [name] Kelly [name]. 02/13/2021 1:15 PM Medical Record Number: [id number] Patient Account Number: [id number] Date of Birth/Sex: Treating RN: 21-Feb-1996 (24 y.o. [name] Primary Care Provider: PA Farrell Ours MES Other Clinician: Referring Provider: Treating Provider/Extender: Geralyn Corwin PA RTRIDGE, JA MES Weeks in Treatment: 0 Debridement Performed for Assessment: Wound #1 Left,Medial Lower Leg Performed By: Physician Geralyn Corwin, DO Debridement Type: Debridement Level of Consciousness (Pre-procedure): Awake and Alert Pre-procedure Verification/Time Out Yes - 14:50 Taken: Start Time: 14:51 Pain Control: Lidocaine 5% topical ointment T Area Debrided (L x W): otal 6 (cm) x 4 (cm) = 24 (cm) Tissue and other material debrided: Non-Viable, Skin: Dermis Level: Skin/Dermis Debridement Description: Selective/Open Wound Instrument: Other : gauze Bleeding: None End Time: 14:54 Response to Treatment: Procedure was tolerated well Level of Consciousness (Post- Awake and Alert procedure): Post Debridement Measurements of Total Wound Length: (cm) 7.9 Width:  (cm) 4 Depth: (cm) 0.3 Volume: (cm) 7.446 Character of Wound/Ulcer Post Debridement: Stable Post Procedure Diagnosis Same as Pre-procedure Electronic Signature(s) Signed: 02/13/2021 6:31:05 PM By: Antonieta Iba Signed: 02/14/2021 12:29:53 PM By: Geralyn Corwin DO Entered By: Antonieta Iba on 02/13/2021 15:06:51 -------------------------------------------------------------------------------- Debridement Details Patient Name: Date of Service: Kelly [name] Kelly H [name]. 02/13/2021 1:15 PM Medical Record Number: [id number] Patient Account Number: [id number] Date of Birth/Sex: Treating RN: 04-Apr-1996 (24 y.o. [name] Primary Care Provider: PA Farrell Ours MES Other Clinician: Referring Provider: Treating Provider/Extender: Geralyn Corwin PA RTRIDGE, JA MES Weeks in Treatment: 0 Debridement Performed for Assessment: Wound #2 Right,Medial Lower Leg Performed By: Physician Geralyn Corwin, DO Debridement Type: Debridement Level of Consciousness (Pre-procedure): Awake and Alert Pre-procedure Verification/Time Out Yes - 14:50 Taken: Start Time: 14:54 Pain Control: Lidocaine 5% topical ointment T Area Debrided (L x W): otal 0.8 (cm) x 0.9 (cm) = 0.72 (cm) Tissue and other material debrided: Non-Viable, Skin: Dermis Level: Skin/Dermis Debridement Description: Selective/Open Wound Instrument: Other : gauze Bleeding: None End Time: 14:58 Response to Treatment: Procedure was tolerated well Level of Consciousness (Post- Awake and Alert procedure): Post Debridement Measurements of Total Wound Length: (cm) 0.8 Width: (cm) 0.9 Depth: (cm) 0.3 Volume: (cm) 0.17 Character of Wound/Ulcer Post Debridement: Stable Post Procedure Diagnosis Same as Pre-procedure Electronic Signature(s) Signed: 02/13/2021 6:31:05 PM By: Antonieta Iba Signed: 02/14/2021 12:29:53 PM By: Geralyn Corwin DO Entered By: Antonieta Iba on 02/13/2021  15:07:45 -------------------------------------------------------------------------------- HPI Details Patient Name: Date of Service: Kelly [name] Kelly H [name]. 02/13/2021 1:15 PM Medical Record Number: [id number] Patient Account Number: [id number] Date of Birth/Sex: Treating RN: [birth year] (24 y.o. [name] Primary Care Provider: PA Farrell Ours MES Other Clinician: Referring Provider: Treating Provider/Extender: Geralyn Corwin PA RTRIDGE, JA MES Weeks in Treatment: 0 History of Present Illness HPI Description: Admission 6/21 [name] is A 25 year old female with a past medical history of systemic lupus erythematosus that presents with bilateral lower extremity wounds that started in  April 2022. She has had skin issues in the past where she developed very small wounds but these healed with time. She has never had open wounds like she does on her legs. She currently reports minimal drainage to the wounds. She does have pain to these areas but has been overall stable. She denies signs of infection. She has visited the ED for this issue and was recently prescribed doxycycline for possible cellulitis. She follows with [facility] for her SLE and is currently on Plaquenil, prednisone and CellCept. Electronic Signature(s) Signed: 02/14/2021 12:29:53 PM By: Geralyn CorwinHoffman, Laprecious Austill DO Entered By: Geralyn CorwinHoffman, Bradlee Heitman on 02/14/2021 12:16:26 -------------------------------------------------------------------------------- Physical Exam Details Patient Name: Date of Service: Kelly RelicNEWKIRK, Kelly [name]. 02/13/2021 1:15 PM Medical Record Number: [id number] Patient Account Number: [id number] Date of Birth/Sex: Treating RN: 23-May-1996 (24 y.o. Wynelle LinkF) Kelly, [name] Primary Care Provider: PA Farrell OursTRIDGE, JA MES Other Clinician: Referring Provider: Treating Provider/Extender: Geralyn CorwinHoffman, Derick Seminara PA RTRIDGE, JA MES Weeks in Treatment: 0 Constitutional respirations regular, non-labored and within target  range for patient.. Cardiovascular 2+ dorsalis pedis/posterior tibialis pulses. Psychiatric pleasant and cooperative. Notes Left lower extremity: On the medial aspect there are 2 large open wounds with nonviable tissue present and scattered granulation tissue. Minimal serous drainage. No obvious signs of infection. Right lower extremity: Right medial side with a small open wound and nonviable tissue present. No signs of infection. Electronic Signature(s) Signed: 02/14/2021 12:29:53 PM By: Geralyn CorwinHoffman, Galan Ghee DO Entered By: Geralyn CorwinHoffman, Zayna Toste on 02/14/2021 12:16:32 -------------------------------------------------------------------------------- Physician Orders Details Patient Name: Date of Service: Kelly MillingNEWKIRK, Kelly Kelly Cherly AndersonH [name]. 02/13/2021 1:15 PM Medical Record Number: [id number] Patient Account Number: [id number] Date of Birth/Sex: Treating RN: 23-May-1996 (24 y.o. 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 - with Dr. Mikey BussingHoffman Return A Other: - [facility]=Supplies Bathing/ Shower/ Hygiene May shower and wash wound with soap and water. Edema Control - Lymphedema / SCD / Other Avoid standing for long periods of time. Other Edema Control Orders/Instructions: - Elevate legs if swelling occurs Additional Orders / Instructions Follow Nutritious Diet Other: - [facility]ound #1 - Lower Leg Wound Laterality: Left, Medial Cleanser: Soap and Water 1 x Per Day/30 Days Discharge Instructions: May shower and wash wound with  dial antibacterial soap and water prior to dressing change. Prim Dressing: Promogran Prisma Matrix, 4.34 (sq in) (silver collagen) (DME) (Generic) 1 x Per Day/30 Days ary Discharge Instructions: Moisten collagen with saline or hydrogel Secondary Dressing: Woven Gauze Sponge, Non-Sterile 4x4 in (DME) (Generic) 1 x Per Day/30 Days Discharge Instructions: Apply over primary dressing as directed. Secured With: [employer] Group[employer]erlix Roll Sterile, 4.5x3.1 (in/yd) (DME) (Generic) 1 x Per Day/30 Days Discharge Instructions: Secure with Kerlix as directed. Secured With: Paper Tape, 2x10 (in/yd) (DME) (Generic) 1 x Per Day/30 Days Discharge Instructions: Secure dressing with tape as directed. Wound #2 - Lower Leg Wound Laterality: Right, Medial Cleanser: Soap and Water 1 x Per Day/30 Days Discharge Instructions: May shower and wash wound with dial antibacterial soap and water prior to dressing change. Prim Dressing: Promogran Prisma Matrix, 4.34 (sq in) (silver collagen) (DME) (Generic) 1 x Per Day/30 Days ary  Discharge Instructions: Moisten collagen with saline or hydrogel Secondary Dressing: Woven Gauze Sponge, Non-Sterile 4x4 in (DME) (Generic) 1 x Per Day/30 Days Discharge Instructions: Apply over primary dressing as directed. Secured With: [employer], 4.5x3.1 (in/yd) (DME) (Generic) 1 x Per Day/30 Days Discharge Instructions: Secure with Kerlix as directed. Secured With: Paper Tape, 2x10 (in/yd) (DME) (Generic) 1 x Per Day/30 Days Discharge Instructions: Secure dressing with tape as directed. Patient Medications llergies: No Known Allergies A Notifications Medication Indication Start End 02/14/2021 lidocaine DOSE 1 - topical 5 % cream - 2-3 applications daily as needed Electronic Signature(s) Signed: 02/14/2021 12:29:53 PM By: Geralyn Corwin DO Previous Signature: 02/14/2021 12:21:31 PM Version By: Geralyn Corwin DO Previous Signature: 02/13/2021 6:31:05 PM Version By: Antonieta Iba Entered  By: Geralyn Corwin on 02/14/2021 12:21:47 -------------------------------------------------------------------------------- Problem List Details Patient Name: Date of Service: Kelly [name] Kelly H [name]. 02/13/2021 1:15 PM Medical Record Number: [id number] Patient Account Number: [id number] Date of Birth/Sex: Treating RN: Oct 24, 1995 (24 y.o. [name] Primary Care Provider: Other Clinician: PA Farrell Ours MES Referring Provider: Treating Provider/Extender: Geralyn Corwin PA RTRIDGE, JA MES Weeks in Treatment: 0 Active Problems ICD-10 Encounter Code Description Active Date MDM Diagnosis L97.819 Non-pressure chronic ulcer of other part of right lower leg with unspecified 02/13/2021 No Yes severity 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 with unspecified 02/13/2021 No Yes severity M32.9 Systemic lupus erythematosus, unspecified 02/14/2021 No Yes Inactive Problems Resolved Problems Electronic Signature(s) Signed: 02/14/2021 12:29:53 PM By: Geralyn Corwin DO Entered By: Geralyn Corwin on 02/14/2021 12:28:46 -------------------------------------------------------------------------------- Progress Note Details Patient Name: Date of Service: Kelly [name] Kelly H [name]. 02/13/2021 1:15 PM Medical Record Number: [id number] Patient Account Number: [id number] Date of Birth/Sex: Treating RN: 1996-07-05 (24 y.o. [name] Primary Care Provider: PA Farrell Ours MES Other Clinician: Referring Provider: Treating Provider/Extender: Geralyn Corwin PA RTRIDGE, JA MES Weeks in Treatment: 0 Subjective Chief Complaint Information obtained from Patient Bilateral lower extremity wounds History of Present Illness (HPI) Admission 6/21 Ms. Noela Brothers is A 25 year old female with a past medical history of systemic lupus erythematosus that presents with bilateral lower extremity wounds that started in April 2022. She has had skin issues in the past where  she developed very small wounds but these healed with time. She has never had open wounds like she does on her legs. She currently reports minimal drainage to the wounds. She does have pain to these areas but has been overall stable. She denies signs of infection. She has visited the ED for this issue and was recently prescribed doxycycline for possible cellulitis. She follows with [facility] for her SLE and is currently on Plaquenil, prednisone and CellCept. Patient History Information obtained from Patient. Allergies No Known Allergies Family History Diabetes - Paternal Grandparents, Hypertension - Paternal Grandparents, Thyroid Problems - Maternal Grandparents,Paternal Grandparents, No family history of Cancer, Heart Disease, Hereditary Spherocytosis, Kidney Disease, Lung Disease, Seizures, Stroke, Tuberculosis. Social History Never smoker, Marital Status - Married, Alcohol Use - Moderate, Drug Use - Current History - Marijuana, Caffeine Use - Daily - Coffee. Medical History Respiratory Patient has history of Asthma Immunological Patient has history of Lupus Erythematosus Review of Systems (ROS) Eyes Complains or has symptoms of Glasses / Contacts - Contacts. Ear/Nose/Mouth/Throat Denies complaints or symptoms of Chronic sinus problems or rhinitis. Cardiovascular Denies complaints or symptoms of Chest pain. Gastrointestinal Denies complaints or symptoms of Frequent diarrhea, Nausea, Vomiting. Endocrine Denies complaints or symptoms of Heat/cold intolerance. Genitourinary Denies complaints  or symptoms of Frequent urination. Integumentary (Skin) Complains or has symptoms of Wounds. Musculoskeletal Denies complaints or symptoms of Muscle Pain, Muscle Weakness. Neurologic Denies complaints or symptoms of Numbness/parasthesias. Psychiatric Denies complaints or symptoms of Claustrophobia, Suicidal. Objective Constitutional respirations regular, non-labored and within  target range for patient.. Vitals Time Taken: 1:35 PM, Height: 62 in, Source: Stated, Temperature: 98.4 F, Pulse: 80 bpm, Respiratory Rate: 16 breaths/min, Blood Pressure: 123/87 mmHg. Cardiovascular 2+ dorsalis pedis/posterior tibialis pulses. Psychiatric pleasant and cooperative. General Notes: Left lower extremity: On the medial aspect there are 2 large open wounds with nonviable tissue present and scattered granulation tissue. Minimal serous drainage. No obvious signs of infection. Right lower extremity: Right medial side with a small open wound and nonviable tissue present. No signs of infection. Integumentary (Hair, Skin) Wound #1 status is Open. Original cause of wound was Other Lesion. The date acquired was: 01/17/2021. The wound is located on the Left,Medial Lower Leg. The wound measures 7.9cm length x 4cm width x 0.3cm depth; 24.819cm^2 area and 7.446cm^3 volume. There is Fat Layer (Subcutaneous Tissue) exposed. There is no tunneling or undermining noted. There is a medium amount of serosanguineous drainage noted. The wound margin is well defined and not attached to the wound base. There is large (67-100%) red granulation within the wound bed. There is a small (1-33%) amount of necrotic tissue within the wound bed including Eschar and Adherent Slough. Wound #2 status is Open. Original cause of wound was Other Lesion. The date acquired was: 01/23/2021. The wound is located on the Right,Medial Lower Leg. The wound measures 0.8cm length x 0.9cm width x 0.3cm depth; 0.565cm^2 area and 0.17cm^3 volume. There is Fat Layer (Subcutaneous Tissue) exposed. There is no tunneling or undermining noted. There is a medium amount of purulent drainage noted. The wound margin is distinct with the outline attached to the wound base. There is small (1-33%) red granulation within the wound bed. There is a large (67-100%) amount of necrotic tissue within the wound bed including Adherent  Slough. Assessment Active Problems ICD-10 Non-pressure chronic ulcer of other part of right lower leg with unspecified severity Non-pressure chronic ulcer of other part of left lower leg with unspecified severity Patient presents with chronic nonhealing atypical wounds. These are likely related to her lupus. She states she has an appointment with her rheumatologist this week. I suspect she may need medication adjustment. Since these are likely due to her autoimmune disease I think there is little benefit in doing a sharp debridement at this time. I did do a mechanical debridement to remove slough. I would like to use collagen and I advised to keep the area covered. She would like some lidocaine cream due to the pain. I will prescribe this. She can follow-up in a week. Procedures Wound #1 Pre-procedure diagnosis of Wound #1 is a Lupus located on the Left,Medial Lower Leg . There was a Selective/Open Wound Skin/Dermis Debridement with a total area of 24 sq cm performed by Geralyn Corwin, DO. With the following instrument(s): gauze to remove Non-Viable tissue/material. Material removed includes Skin: Dermis after achieving pain control using Lidocaine 5% topical ointment. No specimens were taken. A time out was conducted at 14:50, prior to the start of the procedure. There was no bleeding. The procedure was tolerated well. Post Debridement Measurements: 7.9cm length x 4cm width x 0.3cm depth; 7.446cm^3 volume. Character of Wound/Ulcer Post Debridement is stable. Post procedure Diagnosis Wound #1: Same as Pre-Procedure Wound #2 Pre-procedure diagnosis of Wound #2 is  a Lupus located on the Right,Medial Lower Leg . There was a Selective/Open Wound Skin/Dermis Debridement with a total area of 0.72 sq cm performed by Geralyn Corwin, DO. With the following instrument(s): gauze to remove Non-Viable tissue/material. Material removed includes Skin: Dermis after achieving pain control using Lidocaine 5%  topical ointment. No specimens were taken. A time out was conducted at 14:50, prior to the start of the procedure. There was no bleeding. The procedure was tolerated well. Post Debridement Measurements: 0.8cm length x 0.9cm width x 0.3cm depth; 0.17cm^3 volume. Character of Wound/Ulcer Post Debridement is stable. Post procedure Diagnosis Wound #2: Same as Pre-Procedure Plan Follow-up Appointments: Return Appointment in 1 week. - with Dr. Mikey Bussing Other: - [facility]=Supplies Bathing/ Shower/ Hygiene: May shower and wash wound with soap and water. Edema Control - Lymphedema / SCD / Other: Avoid standing for long periods of time. Other Edema Control Orders/Instructions: - Elevate legs if swelling occurs Additional Orders / Instructions: Follow Nutritious Diet Other: - Follow up with Rheumatology at [facility] The following medication(s) was prescribed: lidocaine topical 5 % cream 1 2-3 applications daily as needed starting 02/14/2021 WOUND #1: - Lower Leg Wound Laterality: Left, Medial Cleanser: Soap and Water 1 x Per Day/30 Days Discharge Instructions: May shower and wash wound with dial antibacterial soap and water prior to dressing change. Prim Dressing: Promogran Prisma Matrix, 4.34 (sq in) (silver collagen) (DME) (Generic) 1 x Per Day/30 Days ary Discharge Instructions: Moisten collagen with saline or hydrogel Secondary Dressing: Woven Gauze Sponge, Non-Sterile 4x4 in (DME) (Generic) 1 x Per Day/30 Days Discharge Instructions: Apply over primary dressing as directed. Secured With: [employer], 4.5x3.1 (in/yd) (DME) (Generic) 1 x Per Day/30 Days Discharge Instructions: Secure with Kerlix as directed. Secured With: Paper T ape, 2x10 (in/yd) (DME) (Generic) 1 x Per Day/30 Days Discharge Instructions: Secure dressing with tape as directed. WOUND #2: - Lower Leg Wound Laterality: Right, Medial Cleanser: Soap and Water 1 x Per Day/30 Days Discharge Instructions: May shower and wash wound with  dial antibacterial soap and water prior to dressing change. Prim Dressing: Promogran Prisma Matrix, 4.34 (sq in) (silver collagen) (DME) (Generic) 1 x Per Day/30 Days ary Discharge Instructions: Moisten collagen with saline or hydrogel Secondary Dressing: Woven Gauze Sponge, Non-Sterile 4x4 in (DME) (Generic) 1 x Per Day/30 Days Discharge Instructions: Apply over primary dressing as directed. Secured With: [employer], 4.5x3.1 (in/yd) (DME) (Generic) 1 x Per Day/30 Days Discharge Instructions: Secure with Kerlix as directed. Secured With: Paper T ape, 2x10 (in/yd) (DME) (Generic) 1 x Per Day/30 Days Discharge Instructions: Secure dressing with tape as directed. 1. Collagen 2. Follow-up next week 3. Lidocaine cream Electronic Signature(s) Signed: 02/14/2021 12:29:53 PM By: Geralyn Corwin DO Entered By: Geralyn Corwin on 02/14/2021 12:27:14 -------------------------------------------------------------------------------- HxROS Details Patient Name: Date of Service: Kelly [name] Kelly H [name]. 02/13/2021 1:15 PM Medical Record Number: [id number] Patient Account Number: [id number] Date of Birth/Sex: Treating RN: 1996-03-08 (24 y.o. [name] Primary Care Provider: PA Farrell Ours MES Other Clinician: Referring Provider: Treating Provider/Extender: Geralyn Corwin PA RTRIDGE, JA MES Weeks in Treatment: 0 Information Obtained From Patient Eyes Complaints and Symptoms: Positive for: Glasses / Contacts - Contacts Ear/Nose/Mouth/Throat Complaints and Symptoms: Negative for: Chronic sinus problems or rhinitis Cardiovascular Complaints and Symptoms: Negative for: Chest pain Gastrointestinal Complaints and Symptoms: Negative for: Frequent diarrhea; Nausea; Vomiting Endocrine Complaints and Symptoms: Negative for: Heat/cold intolerance Genitourinary Complaints and Symptoms: Negative for: Frequent urination Integumentary (Skin) Complaints and Symptoms: Positive for:  Wounds Musculoskeletal Complaints and Symptoms: Negative for: Muscle Pain; Muscle Weakness Neurologic Complaints and Symptoms: Negative for: Numbness/parasthesias Psychiatric Complaints and Symptoms: Negative for: Claustrophobia; Suicidal Respiratory Medical History: Positive for: Asthma Immunological Medical History: Positive for: Lupus Erythematosus Oncologic Immunizations Pneumococcal Vaccine: Received Pneumococcal Vaccination: No Implantable Devices None Family and Social History Cancer: No; Diabetes: Yes - Paternal Grandparents; Heart Disease: No; Hereditary Spherocytosis: No; Hypertension: Yes - Paternal Grandparents; Kidney Disease: No; Lung Disease: No; Seizures: No; Stroke: No; Thyroid Problems: Yes - Maternal Grandparents,Paternal Grandparents; Tuberculosis: No; Never smoker; Marital Status - Married; Alcohol Use: Moderate; Drug Use: Current History - Marijuana; Caffeine Use: Daily - Coffee; Financial Concerns: No; Food, Clothing or Shelter Needs: No; Support System Lacking: No Electronic Signature(s) Signed: 02/13/2021 6:31:05 PM By: Antonieta Iba Signed: 02/14/2021 12:29:53 PM By: Geralyn Corwin DO Entered By: Antonieta Iba on 02/13/2021 13:41:26 -------------------------------------------------------------------------------- SuperBill Details Patient Name: Date of Service: Kelly [name] Kelly [name]. 02/13/2021 Medical Record Number: [id number] Patient Account Number: [id number] Date of Birth/Sex: Treating RN: 10/25/1995 (24 y.o. 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 with unspecified severity M32.9 Systemic lupus  erythematosus, unspecified Facility Procedures CPT4 Code: 46962952 Description: 99214 - WOUND CARE VISIT-LEV 4 EST PT Modifier: 25 Quantity: 1 CPT4 Code: [id number] Description: 97597 - DEBRIDE WOUND 1ST 20 SQ CM OR < ICD-10 Diagnosis Description L97.819 Non-pressure chronic ulcer of other part of right lower leg with unspecified seve 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 with  unspecified sever Modifier: rity ity Quantity: 1 CPT4 Code: [id number] Description: 97598 - DEBRIDE WOUND EA ADDL 20 SQ CM ICD-10 Diagnosis Description L97.819 Non-pressure chronic ulcer of other part of right lower leg with unspecified seve 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 with unspecified  sever Modifier: rity ity Quantity: 1 Physician Procedures : CPT4 Code Description Modifier [id number] WC PHYS LEVEL 3 NEW PT ICD-10 Diagnosis Description L97.819 Non-pressure chronic ulcer of other part of right lower leg with unspecified severity 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  with unspecified severity M32.9 Systemic lupus erythematosus, unspecified Quantity: 1 : [id number] - WC PHYS DEBR WO ANESTH 20 SQ CM ICD-10 Diagnosis Description L97.819 Non-pressure chronic ulcer of other part of right lower leg with unspecified severity 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 with  unspecified severity Quantity: 1 : [id number] - WC PHYS DEBR WO ANESTH EA ADD 20 CM ICD-10 Diagnosis Description L97.819 Non-pressure chronic ulcer of other part of right lower leg with unspecified severity 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 with  unspecified severity Quantity: 1 Electronic Signature(s) Signed: 02/14/2021 12:29:53 PM By: Geralyn Corwin DO Previous Signature: 02/13/2021 5:03:25 PM Version By: Antonieta Iba Entered By: Geralyn Corwin on 02/14/2021 12:29:32

## 2021-02-16 NOTE — Progress Notes (Signed)
MABRY, SANTARELLI ([id number]) Visit Report for 02/13/2021 Allergy List Details Patient Name: Date of Service: Kelly [name], Kelly [name] 02/13/2021 1:15 PM Medical Record Number: [id number] Patient Account Number: [id number] Date of Birth/Sex: Treating RN: 1996-04-14 (25 y.o. Kelly [name] Primary Care Kyri Shader: PA [facility]rell Ours MES Other Clinician: Referring Algenis Ballin: Treating Nyah Shepherd/Extender: Geralyn Corwin PA RTRIDGE, JA MES Weeks in Treatment: 0 Allergies Active Allergies No Known Allergies Allergy Notes Electronic Signature(s) Signed: 02/13/2021 6:31:05 PM By: Antonieta Iba Entered By: Antonieta Iba on 02/13/2021 13:36:26 -------------------------------------------------------------------------------- Arrival Information Details Patient Name: Date of Service: Kelly Milling RIA Cherly Anderson. 02/13/2021 1:15 PM Medical Record Number: [id number] Patient Account Number: [id number] Date of Birth/Sex: Treating RN: Apr 10, 1996 (25 y.o. Kelly [name] Primary Care Treyton Slimp: PA [facility] MES Other Clinician: Referring Roland Prine: Treating Malary Aylesworth/Extender: Geralyn Corwin PA RTRIDGE, JA MES Weeks in Treatment: 0 Visit Information Patient Arrived: Ambulatory Arrival Time: 13:29 Accompanied By: spouse Transfer Assistance: None Patient Identification Verified: Yes Secondary Verification Process Completed: Yes Patient Requires Transmission-Based Precautions: No Patient Has Alerts: No Electronic Signature(s) Signed: 02/13/2021 6:31:05 PM By: Antonieta Iba Entered By: Antonieta Iba on 02/13/2021 13:34:35 -------------------------------------------------------------------------------- Clinic Level of Care Assessment Details Patient Name: Date of Service: SHANTE, ARCHAMBEAULT 02/13/2021 1:15 PM Medical Record Number: [id number] Patient Account Number: [id number] Date of Birth/Sex: Treating RN: 22-Sep-1995 (25 y.o. Kelly [name] Primary Care Akasha Melena: PA [facility] MES Other  Clinician: Referring Maci Eickholt: Treating Deaja Rizo/Extender: Geralyn Corwin PA RTRIDGE, JA MES Weeks in Treatment: 0 Clinic Level of Care Assessment Items TOOL 2 Quantity Score X- 1 0 Use when only an EandM is performed on the INITIAL visit ASSESSMENTS - Nursing Assessment / Reassessment X- 1 20 General Physical Exam (combine w/ comprehensive assessment (listed just below) when performed on new pt. evals) X- 1 25 Comprehensive Assessment (HX, ROS, Risk Assessments, Wounds Hx, etc.) ASSESSMENTS - Wound and Skin A ssessment / Reassessment []  - 0 Simple Wound Assessment / Reassessment - one wound X- 2 5 Complex Wound Assessment / Reassessment - multiple wounds []  - 0 Dermatologic / Skin Assessment (not related to wound area) ASSESSMENTS - Ostomy and/or Continence Assessment and Care []  - 0 Incontinence Assessment and Management []  - 0 Ostomy Care Assessment and Management (repouching, etc.) PROCESS - Coordination of Care []  - 0 Simple Patient / Family Education for ongoing care X- 1 20 Complex (extensive) Patient / Family Education for ongoing care X- 1 10 Staff obtains , Records, T Results / Process Orders est []  - 0 Staff telephones HHA, Nursing Homes / Clarify orders / etc []  - 0 Routine Transfer to another Facility (non-emergent condition) []  - 0 Routine Hospital Admission (non-emergent condition) []  - 0 New Admissions / / Ordering NPWT Apligraf, etc. , []  - 0 Emergency Hospital Admission (emergent condition) []  - 0 Simple Discharge Coordination []  - 0 Complex (extensive) Discharge Coordination PROCESS - Special Needs []  - 0 Pediatric / Minor Patient Management []  - 0 Isolation Patient Management []  - 0 Hearing / Language / Visual special needs []  - 0 Assessment of Community assistance (transportation, D/C planning, etc.) []  - 0 Additional assistance / Altered mentation []  - 0 Support Surface(s) Assessment (bed, cushion,  seat, etc.) INTERVENTIONS - Wound Cleansing / Measurement []  - 0 Wound Imaging (photographs - any number of wounds) []  - 0 Wound Tracing (instead of photographs) []  - 0 Simple Wound Measurement - one wound X- 2 5 Complex Wound Measurement - multiple wounds []  - 0  Simple Wound Cleansing - one wound X- 2 5 Complex Wound Cleansing - multiple wounds INTERVENTIONS - Wound Dressings []  - 0 Small Wound Dressing one or multiple wounds X- 2 15 Medium Wound Dressing one or multiple wounds []  - 0 Large Wound Dressing one or multiple wounds []  - 0 Application of Medications - injection INTERVENTIONS - Miscellaneous []  - 0 External ear exam []  - 0 Specimen Collection (cultures, biopsies, blood, body fluids, etc.) []  - 0 Specimen(s) / Culture(s) sent or taken to Lab for analysis []  - 0 Patient Transfer (multiple staff / Nurse, adultHoyer Lift / Similar devices) []  - 0 Simple Staple / Suture removal (25 or less) []  - 0 Complex Staple / Suture removal (26 or more) []  - 0 Hypo / Hyperglycemic Management (close monitor of Blood Glucose) []  - 0 Ankle / Brachial Index (ABI) - do not check if billed separately Has the patient been seen at the hospital within the last three years: Yes Total Score: 135 Level Of Care: New/Established - Level 4 Electronic Signature(s) Signed: 02/13/2021 6:31:05 PM By: Antonieta IbaBarnhart, Jodi Entered By: Antonieta IbaBarnhart, Jodi on 02/13/2021 16:58:53 -------------------------------------------------------------------------------- Encounter Discharge Information Details Patient Name: Date of Service: Kelly [name], [name] 02/13/2021 1:15 PM Medical Record Number: [id number] Patient Account Number: [id number] Date of Birth/Sex: Treating RN: 1995-11-07 (25 y.o. Kelly CluckF) Barnhart, Jodi Primary Care Zyra Parrillo: PA Farrell OursTRIDGE, JA MES Other Clinician: Referring [name]: Treating Petrita Blunck/Extender: Geralyn CorwinHoffman, Jessica PA RTRIDGE, JA MES Weeks in Treatment: 0 Encounter Discharge Information Items Post  Procedure Vitals Discharge Condition: Stable Temperature (F): 98.4 Ambulatory Status: Ambulatory Pulse (bpm): 80 Discharge Destination: Home Respiratory Rate (breaths/min): 16 Transportation: Private Auto Blood Pressure (mmHg): 123/87 Accompanied By: spouse Schedule Follow-up Appointment: Yes Clinical Summary of Care: Provided on 02/13/2021 Form Type Recipient Paper Patient Patient Electronic Signature(s) Signed: 02/13/2021 5:05:11 PM By: Antonieta IbaBarnhart, Jodi Entered By: Antonieta IbaBarnhart, Jodi on 02/13/2021 17:05:11 -------------------------------------------------------------------------------- Lower Extremity Assessment Details Patient Name: Date of Service: Babette RelicEWKIRK, [name] 02/13/2021 1:15 PM Medical Record Number: [id number] Patient Account Number: [id number] Date of Birth/Sex: Treating RN: 1995-11-07 (25 y.o. Kelly CluckF) Barnhart, Jodi Primary Care Jauan Wohl: PA Farrell OursTRIDGE, JA MES Other Clinician: Referring Teryl Gubler: Treating Kaidyn Hernandes/Extender: Geralyn CorwinHoffman, Jessica PA RTRIDGE, JA MES Weeks in Treatment: 0 Edema Assessment Assessed: [Left: Yes] [Right: Yes] Edema: [Left: No] [Right: No] Calf Left: Right: Point of Measurement: From Medial Instep 34 cm 29 cm Ankle Left: Right: Point of Measurement: From Medial Instep 23 cm 23 cm Vascular Assessment Pulses: Dorsalis Pedis Palpable: [Left:Yes] [Right:Yes] Notes ABI attempted, unable to tolerate d/t pain Electronic Signature(s) Signed: 02/13/2021 6:31:05 PM By: Antonieta IbaBarnhart, Jodi Entered By: Antonieta IbaBarnhart, Jodi on 02/13/2021 13:59:57 -------------------------------------------------------------------------------- Multi-Disciplinary Care Plan Details Patient Name: Date of Service: Kelly [name], [name] 02/13/2021 1:15 PM Medical Record Number: [id number] Patient Account Number: [id number] Date of Birth/Sex: Treating RN: 1995-11-07 (25 y.o. Kelly CluckF) Barnhart, Jodi Primary Care Flossie Wexler: PA Farrell OursTRIDGE, JA MES Other Clinician: Referring Xiadani Damman: Treating  Addilee Neu/Extender: Geralyn CorwinHoffman, Jessica PA RTRIDGE, JA MES Weeks in Treatment: 0 Active Inactive Orientation to the Wound Care Program Nursing Diagnoses: Knowledge deficit related to the wound healing center program Goals: Patient/caregiver will verbalize understanding of the Wound Healing Center Program Date Initiated: 02/13/2021 Target Resolution Date: 03/13/2021 Goal Status: Active Interventions: Provide education on orientation to the wound center Notes: Wound/Skin Impairment Nursing Diagnoses: Impaired tissue integrity Goals: Patient/caregiver will verbalize understanding of skin care regimen Date Initiated: 02/13/2021 Target Resolution Date: 03/13/2021 Goal Status: Active Ulcer/skin breakdown will have a volume reduction of 30% by week  4 Date Initiated: 02/13/2021 Target Resolution Date: 03/13/2021 Goal Status: Active Interventions: Assess patient/caregiver ability to obtain necessary supplies Assess patient/caregiver ability to perform ulcer/skin care regimen upon admission and as needed Assess ulceration(s) every visit Provide education on ulcer and skin care Treatment Activities: Skin care regimen initiated : 02/13/2021 Topical wound management initiated : 02/13/2021 Notes: Electronic Signature(s) Signed: 02/13/2021 6:31:05 PM By: Antonieta Iba Entered By: Antonieta Iba on 02/13/2021 14:49:26 -------------------------------------------------------------------------------- Pain Assessment Details Patient Name: Date of Service: Kelly Milling RIA Cherly Anderson. 02/13/2021 1:15 PM Medical Record Number: [id number] Patient Account Number: [id number] Date of Birth/Sex: Treating RN: [birth year] (24 y.o. Kelly [name] Primary Care Lavina Resor: PA [facility] MES Other Clinician: Referring Crisanto Nied: Treating Augusto Deckman/Extender: Geralyn Corwin PA RTRIDGE, JA MES Weeks in Treatment: 0 Active Problems Location of Pain Severity and Description of Pain Patient Has Paino Yes Site  Locations Pain Location: Pain in Ulcers With Dressing Change: Yes Duration of the Pain. Constant / Intermittento Intermittent Rate the pain. Current Pain Level: 7 Character of Pain Describe the Pain: Burning, Tender, Throbbing Pain Management and Medication Current Pain Management: Medication: Yes Cold Application: No Rest: Yes Massage: No Activity: No T.E.N.S.: No Heat Application: No Leg drop or elevation: No Is the Current Pain Management Adequate: Inadequate How does your wound impact your activities of daily livingo Sleep: No Bathing: No Appetite: No Relationship With Others: No Bladder Continence: No Emotions: No Bowel Continence: No Work: No Toileting: No Drive: No Dressing: No Hobbies: No Electronic Signature(s) Signed: 02/13/2021 6:31:05 PM By: Antonieta Iba Entered By: Antonieta Iba on 02/13/2021 14:00:29 -------------------------------------------------------------------------------- Patient/Caregiver Education Details Patient Name: Date of Service: Babette Relic 6/20/2022andnbsp1:15 PM Medical Record Number: [id number] Patient Account Number: [id number] Date of Birth/Gender: Treating RN: Mar 29, 1996 (24 y.o. Kelly [name] Primary Care Physician: PA [facility] MES Other Clinician: Referring Physician: Treating Physician/Extender: Geralyn Corwin PA RTRIDGE, JA MES Weeks in Treatment: 0 Education Assessment Education Provided To: Patient Education Topics Provided Nutrition: Methods: Explain/Verbal, Printed Responses: State content correctly Welcome T [facility]: o Handouts: Welcome T [facility] o Methods: Explain/Verbal, Printed Responses: State content correctly Wound/Skin Impairment: Methods: Explain/Verbal, Printed Responses: State content correctly Electronic Signature(s) Signed: 02/13/2021 6:31:05 PM By: Antonieta Iba Entered By: Antonieta Iba on 02/13/2021  14:50:01 -------------------------------------------------------------------------------- Wound Assessment Details Patient Name: Date of Service: Kelly Milling RIA Cherly Anderson. 02/13/2021 1:15 PM Medical Record Number: [id number] Patient Account Number: [id number] Date of Birth/Sex: Treating RN: Jul 02, 1996 (24 y.o. Kelly [name] Primary Care Ruhee Enck: PA [facility] MES Other Clinician: Referring Jacobb Alen: Treating Tully Mcinturff/Extender: Geralyn Corwin PA RTRIDGE, JA MES Weeks in Treatment: 0 Wound Status Wound Number: 1 Primary Etiology: Lupus Wound Location: Left, Medial Lower Leg Wound Status: Open Wounding Event: Other Lesion Comorbid History: Asthma, Lupus Erythematosus Date Acquired: 01/17/2021 Weeks Of Treatment: 0 Clustered Wound: No Photos Wound Measurements Length: (cm) 7.9 Width: (cm) 4 Depth: (cm) 0.3 Area: (cm) 24.819 Volume: (cm) 7.446 % Reduction in Area: 0% % Reduction in Volume: 0% Epithelialization: None Tunneling: No Undermining: No Wound Description Classification: Full Thickness Without Exposed Support Struct Wound Margin: Well defined, not attached Exudate Amount: Medium Exudate Type: Serosanguineous Exudate Color: red, brown ures Foul Odor After Cleansing: No Slough/Fibrino Yes Wound Bed Granulation Amount: Large (67-100%) Exposed Structure Granulation Quality: Red Fascia Exposed: No Necrotic Amount: Small (1-33%) Fat Layer (Subcutaneous Tissue) Exposed: Yes Necrotic Quality: Eschar, Adherent Slough Tendon Exposed: No Muscle Exposed: No Joint Exposed: No Bone Exposed: No Treatment Notes  Wound #1 (Lower Leg) Wound Laterality: Left, Medial Cleanser Soap and Water Discharge Instruction: May shower and wash wound with dial antibacterial soap and water prior to dressing change. Peri-Wound Care Topical Primary Dressing Promogran Prisma Matrix, 4.34 (sq in) (silver collagen) Discharge Instruction: Moisten collagen with saline or  hydrogel Secondary Dressing Woven Gauze Sponge, Non-Sterile 4x4 in Discharge Instruction: Apply over primary dressing as directed. Secured With [employer], 4.5x3.1 (in/yd) Discharge Instruction: Secure with Kerlix as directed. Paper Tape, 2x10 (in/yd) Discharge Instruction: Secure dressing with tape as directed. Compression Wrap Compression Stockings Add-Ons Electronic Signature(s) Signed: 02/13/2021 6:31:05 PM By: Antonieta Iba Signed: 02/16/2021 8:38:46 AM By: Rolan Lipa Entered By: Rolan Lipa on 02/13/2021 15:34:18 -------------------------------------------------------------------------------- Wound Assessment Details Patient Name: Date of Service: Kelly Milling RIA Cherly Anderson. 02/13/2021 1:15 PM Medical Record Number: [id number] Patient Account Number: [id number] Date of Birth/Sex: Treating RN: 02-Nov-1995 (24 y.o. Kelly [name] Primary Care Karlina Suares: PA [facility] MES Other Clinician: Referring Ward Boissonneault: Treating Darrall Strey/Extender: Geralyn Corwin PA RTRIDGE, JA MES Weeks in Treatment: 0 Wound Status Wound Number: 2 Primary Etiology: Lupus Wound Location: Right, Medial Lower Leg Wound Status: Open Wounding Event: Other Lesion Comorbid History: Asthma, Lupus Erythematosus Date Acquired: 01/23/2021 Weeks Of Treatment: 0 Clustered Wound: No Photos Wound Measurements Length: (cm) 0.8 Width: (cm) 0.9 Depth: (cm) 0.3 Area: (cm) 0.565 Volume: (cm) 0.17 % Reduction in Area: 0% % Reduction in Volume: 0% Epithelialization: None Tunneling: No Undermining: No Wound Description Classification: Full Thickness Without Exposed Support Structures Wound Margin: Distinct, outline attached Exudate Amount: Medium Exudate Type: Purulent Exudate Color: yellow, brown, green Foul Odor After Cleansing: No Slough/Fibrino Yes Wound Bed Granulation Amount: Small (1-33%) Exposed Structure Granulation Quality: Red Fascia Exposed: No Necrotic Amount: Large  (67-100%) Fat Layer (Subcutaneous Tissue) Exposed: Yes Necrotic Quality: Adherent Slough Tendon Exposed: No Muscle Exposed: No Joint Exposed: No Bone Exposed: No Treatment Notes Wound #2 (Lower Leg) Wound Laterality: Right, Medial Cleanser Soap and Water Discharge Instruction: May shower and wash wound with dial antibacterial soap and water prior to dressing change. Peri-Wound Care Topical Primary Dressing Promogran Prisma Matrix, 4.34 (sq in) (silver collagen) Discharge Instruction: Moisten collagen with saline or hydrogel Secondary Dressing Woven Gauze Sponge, Non-Sterile 4x4 in Discharge Instruction: Apply over primary dressing as directed. Secured With [employer], 4.5x3.1 (in/yd) Discharge Instruction: Secure with Kerlix as directed. Paper Tape, 2x10 (in/yd) Discharge Instruction: Secure dressing with tape as directed. Compression Wrap Compression Stockings Add-Ons Electronic Signature(s) Signed: 02/13/2021 6:31:05 PM By: Antonieta Iba Signed: 02/16/2021 8:38:46 AM By: Rolan Lipa Entered By: Rolan Lipa on 02/13/2021 15:34:46 -------------------------------------------------------------------------------- Vitals Details Patient Name: Date of Service: Kelly Milling [name] 02/13/2021 1:15 PM Medical Record Number: [id number] Patient Account Number: [id number] Date of Birth/Sex: Treating RN: 02-04-1996 (24 y.o. Kelly [name] Primary Care Clayvon Parlett: PA [facility] Ours MES Other Clinician: Referring Marcha Licklider: Treating Azriella Mattia/Extender: Geralyn Corwin PA RTRIDGE, JA MES Weeks in Treatment: 0 Vital Signs Time Taken: 13:35 Temperature (F): 98.4 Height (in): 62 Pulse (bpm): 80 Source: Stated Respiratory Rate (breaths/min): 16 Blood Pressure (mmHg): 123/87 Reference Range: 80 - 120 mg / dl Electronic Signature(s) Signed: 02/13/2021 6:31:05 PM By: Antonieta Iba Entered By: Antonieta Iba on 02/13/2021 13:35:54

## 2021-02-20 ENCOUNTER — Other Ambulatory Visit: Payer: Self-pay

## 2021-02-20 ENCOUNTER — Encounter (HOSPITAL_BASED_OUTPATIENT_CLINIC_OR_DEPARTMENT_OTHER): Payer: BC Managed Care – PPO | Admitting: Internal Medicine

## 2021-02-20 DIAGNOSIS — L97829 Non-pressure chronic ulcer of other part of left lower leg with unspecified severity: Secondary | ICD-10-CM | POA: Diagnosis not present

## 2021-02-20 DIAGNOSIS — L97819 Non-pressure chronic ulcer of other part of right lower leg with unspecified severity: Secondary | ICD-10-CM

## 2021-02-20 DIAGNOSIS — L97812 Non-pressure chronic ulcer of other part of right lower leg with fat layer exposed: Secondary | ICD-10-CM | POA: Diagnosis not present

## 2021-02-20 NOTE — Progress Notes (Addendum)
Kelly [name], Kelly [name] ([id number]) Visit Report for 02/20/2021 Chief Complaint Document Details Patient Name: Date of Service: Kelly [name], Kelly [name]. 02/20/2021 3:30 PM Medical Record Number: [id number] Patient Account Number: [id number] Date of Birth/Sex: Treating RN: 12-Dec-1995 (24 y.o. [name] Primary Care Provider: PA Farrell Ours MES Other Clinician: Referring Provider: Treating Provider/Extender: Geralyn Corwin PA RTRIDGE, JA MES Weeks in Treatment: 1 Information Obtained from: Patient Chief Complaint Bilateral lower extremity wounds Electronic Signature(s) Signed: 02/20/2021 4:20:56 PM By: Geralyn Corwin DO Entered By: Geralyn Corwin on 02/20/2021 16:14:18 -------------------------------------------------------------------------------- Debridement Details Patient Name: Date of Service: Kelly Milling [name] H M. 02/20/2021 3:30 PM Medical Record Number: [id number] Patient Account Number: [id number] Date of Birth/Sex: Treating RN: 05-08-1996 (24 y.o. [name] Primary Care Provider: PA Farrell Ours MES Other Clinician: Referring Provider: Treating Provider/Extender: Geralyn Corwin PA RTRIDGE, JA MES Weeks in Treatment: 1 Debridement Performed for Assessment: Wound #1 Left,Medial Lower Leg Performed By: Physician Geralyn Corwin, DO Debridement Type: Debridement Level of Consciousness (Pre-procedure): Awake and Alert Pre-procedure Verification/Time Out Yes - 15:57 Taken: Start Time: 15:58 Pain Control: Other : Benzocaine T Area Debrided (L x W): otal 5 (cm) x 3 (cm) = 15 (cm) Tissue and other material debrided: Non-Viable, Slough, Slough Level: Non-Viable Tissue Debridement Description: Selective/Open Wound Instrument: Curette Bleeding: Minimum Hemostasis Achieved: Pressure End Time: 16:03 Response to Treatment: Procedure was tolerated well Level of Consciousness (Post- Awake and Alert procedure): Post Debridement Measurements of Total  Wound Length: (cm) 7.7 Width: (cm) 3.5 Depth: (cm) 0.4 Volume: (cm) 8.467 Character of Wound/Ulcer Post Debridement: Stable Post Procedure Diagnosis Same as Pre-procedure Electronic Signature(s) Signed: 02/20/2021 4:20:56 PM By: Geralyn Corwin DO Signed: 02/20/2021 5:08:20 PM By: Antonieta Iba Entered By: Antonieta Iba on 02/20/2021 16:04:59 -------------------------------------------------------------------------------- Debridement Details Patient Name: Date of Service: Kelly Milling [name] H M. 02/20/2021 3:30 PM Medical Record Number: [id number] Patient Account Number: [id number] Date of Birth/Sex: Treating RN: [birth year] (24 y.o. [name] Primary Care Provider: PA Farrell Ours MES Other Clinician: Referring Provider: Treating Provider/Extender: Geralyn Corwin PA RTRIDGE, JA MES Weeks in Treatment: 1 Debridement Performed for Assessment: Wound #2 Right,Medial Lower Leg Performed By: Physician Geralyn Corwin, DO Debridement Type: Debridement Level of Consciousness (Pre-procedure): Awake and Alert Pre-procedure Verification/Time Out Yes - 15:57 Taken: Start Time: 16:03 Pain Control: Other : Benzocaine T Area Debrided (L x W): otal 1 (cm) x 0.9 (cm) = 0.9 (cm) Tissue and other material debrided: Non-Viable, Slough, Slough Level: Non-Viable Tissue Debridement Description: Selective/Open Wound Instrument: Curette Bleeding: Minimum Hemostasis Achieved: Pressure End Time: 16:05 Response to Treatment: Procedure was tolerated well Level of Consciousness (Post- Awake and Alert procedure): Post Debridement Measurements of Total Wound Length: (cm) 1 Width: (cm) 0.9 Depth: (cm) 0.4 Volume: (cm) 0.283 Character of Wound/Ulcer Post Debridement: Stable Post Procedure Diagnosis Same as Pre-procedure Electronic Signature(s) Signed: 02/20/2021 4:20:56 PM By: Geralyn Corwin DO Signed: 02/20/2021 5:08:20 PM By: Antonieta Iba Entered By: Antonieta Iba on  02/20/2021 16:05:50 -------------------------------------------------------------------------------- HPI Details Patient Name: Date of Service: Kelly Milling [name] H M. 02/20/2021 3:30 PM Medical Record Number: [id number] Patient Account Number: [id number] Date of Birth/Sex: Treating RN: 1996/04/05 (24 y.o. [name] Primary Care Provider: PA Farrell Ours MES Other Clinician: Referring Provider: Treating Provider/Extender: Geralyn Corwin PA RTRIDGE, JA MES Weeks in Treatment: 1 History of Present Illness HPI Description: Admission 6/21 Kelly [name] is A 26 year old female with a past medical history of systemic lupus erythematosus that presents with bilateral lower extremity wounds that  started in April 2022. She has had skin issues in the past where she developed very small wounds but these healed with time. She has never had open wounds like she does on her legs. She currently reports minimal drainage to the wounds. She does have pain to these areas but has been overall stable. She denies signs of infection. She has visited the ED for this issue and was recently prescribed doxycycline for possible cellulitis. She follows with [facility] for her SLE and is currently on Plaquenil, prednisone and CellCept. 6/27; patient presents for 1 week follow-up. She states she saw her rheumatologist on 6/22 and prednisone was increased from 10 mg to 20 mg daily. She is currently at the highest dose of Plaquenil and CellCept and is going to try an infusion later today at her regimen. She reports improvement to the wounds. She has been using collagen with dressing changes. She currently denies signs of infection. Electronic Signature(s) Signed: 02/20/2021 4:20:56 PM By: Geralyn CorwinHoffman, Laurinda Carreno DO Entered By: Geralyn CorwinHoffman, Kimberlye Dilger on 02/20/2021 16:15:24 -------------------------------------------------------------------------------- Physical Exam Details Patient Name: Date of Service: Kelly [name],  Kelly [name] Cherly AndersonH M. 02/20/2021 3:30 PM Medical Record Number: [id number] Patient Account Number: [id number] Date of Birth/Sex: Treating RN: September 15, 1995 (24 y.o. Roel CluckF) Barnhart, Jodi Primary Care Provider: PA Farrell OursTRIDGE, JA MES Other Clinician: Referring Provider: Treating Provider/Extender: Geralyn CorwinHoffman, Calogero Geisen PA RTRIDGE, JA MES Weeks in Treatment: 1 Constitutional respirations regular, non-labored and within target range for patient.. Cardiovascular 2+ dorsalis pedis/posterior tibialis pulses. Psychiatric pleasant and cooperative. Notes Left lower extremity: On the medial aspect there are 2 large open wounds with nonviable tissue But mostly granulation tissue present. Minimal serous drainage. No obvious signs of infection. Right lower extremity: Right medial side with a small open wound and nonviable tissue present. No signs of infection. Electronic Signature(s) Signed: 02/20/2021 4:20:56 PM By: Geralyn CorwinHoffman, Dorothee Napierkowski DO Entered By: Geralyn CorwinHoffman, Ashawn Rinehart on 02/20/2021 16:16:37 -------------------------------------------------------------------------------- Physician Orders Details Patient Name: Date of Service: Kelly [name], Kelly [name] H M. 02/20/2021 3:30 PM Medical Record Number: [id number] Patient Account Number: [id number] Date of Birth/Sex: Treating RN: September 15, 1995 (24 y.o. Roel CluckF) Barnhart, Jodi Primary Care Provider: PA Farrell OursTRIDGE, JA MES Other Clinician: Referring Provider: Treating Provider/Extender: Geralyn CorwinHoffman, Oswaldo Cueto PA RTRIDGE, JA MES Weeks in Treatment: 1 Verbal / Phone Orders: No Diagnosis Coding ICD-10 Coding Code Description L97.819 Non-pressure chronic ulcer of other part of right lower leg with unspecified severity 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 with unspecified severity M32.9 Systemic lupus erythematosus, unspecified Follow-up Appointments ppointment in 2 weeks. - with Dr. Mikey BussingHoffman Return A Other: - [facility]=Supplies Bathing/ Shower/ Hygiene May shower and wash wound with  soap and water. Edema Control - Lymphedema / SCD / Other Avoid standing for long periods of time. Other Edema Control Orders/Instructions: - Elevate legs if swelling occurs Additional Orders / Instructions Follow Nutritious Diet Wound Treatment Wound #1 - Lower Leg Wound Laterality: Left, Medial Cleanser: Soap and Water 1 x Per Day/30 Days Discharge Instructions: May shower and wash wound with dial antibacterial soap and water prior to dressing change. Topical: Skintegrity Hydrogel 4 (oz) (DME) (Generic) 1 x Per Day/30 Days Discharge Instructions: Apply hydrogel as directed Prim Dressing: Promogran Prisma Matrix, 4.34 (sq in) (silver collagen) (Generic) 1 x Per Day/30 Days ary Discharge Instructions: Moisten collagen with saline or hydrogel Secondary Dressing: Woven Gauze Sponge, Non-Sterile 4x4 in (Generic) 1 x Per Day/30 Days Discharge Instructions: Apply over primary dressing as directed. Secured With: [employer] Group[employer]erlix Roll Sterile, 4.5x3.1 (in/yd) (Generic) 1 x Per  Day/30 Days Discharge Instructions: Secure with Kerlix as directed. Secured With: Paper Tape, 2x10 (in/yd) (Generic) 1 x Per Day/30 Days Discharge Instructions: Secure dressing with tape as directed. Wound #2 - Lower Leg Wound Laterality: Right, Medial Cleanser: Soap and Water 1 x Per Day/30 Days Discharge Instructions: May shower and wash wound with dial antibacterial soap and water prior to dressing change. Topical: Skintegrity Hydrogel 4 (oz) (DME) (Generic) 1 x Per Day/30 Days Discharge Instructions: Apply hydrogel as directed Prim Dressing: Promogran Prisma Matrix, 4.34 (sq in) (silver collagen) (Generic) 1 x Per Day/30 Days ary Discharge Instructions: Moisten collagen with saline or hydrogel Secondary Dressing: Woven Gauze Sponge, Non-Sterile 4x4 in (Generic) 1 x Per Day/30 Days Discharge Instructions: Apply over primary dressing as directed. Secured With: [employer], 4.5x3.1 (in/yd) (Generic) 1 x Per Day/30  Days Discharge Instructions: Secure with Kerlix as directed. Secured With: Paper Tape, 2x10 (in/yd) (Generic) 1 x Per Day/30 Days Discharge Instructions: Secure dressing with tape as directed. Electronic Signature(s) Signed: 02/20/2021 4:20:56 PM By: Geralyn Corwin DO Entered By: Geralyn Corwin on 02/20/2021 16:17:20 -------------------------------------------------------------------------------- Problem List Details Patient Name: Date of Service: Kelly Milling [name] H M. 02/20/2021 3:30 PM Medical Record Number: [id number] Patient Account Number: [id number] Date of Birth/Sex: Treating RN: [birth year] (24 y.o. [name] Primary Care Provider: PA Farrell Ours MES Other Clinician: Referring Provider: Treating Provider/Extender: Geralyn Corwin PA RTRIDGE, JA MES Weeks in Treatment: 1 Active Problems ICD-10 Encounter Code Description Active Date MDM Diagnosis L97.819 Non-pressure chronic ulcer of other part of right lower leg with unspecified 02/13/2021 No Yes severity 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 with unspecified 02/13/2021 No Yes severity M32.9 Systemic lupus erythematosus, unspecified 02/14/2021 No Yes Inactive Problems Resolved Problems Electronic Signature(s) Signed: 02/20/2021 4:20:56 PM By: Geralyn Corwin DO Entered By: Geralyn Corwin on 02/20/2021 16:13:54 -------------------------------------------------------------------------------- Progress Note Details Patient Name: Date of Service: Kelly Milling [name] H M. 02/20/2021 3:30 PM Medical Record Number: [id number] Patient Account Number: [id number] Date of Birth/Sex: Treating RN: June 21, 1996 (24 y.o. [name] Primary Care Provider: PA Farrell Ours MES Other Clinician: Referring Provider: Treating Provider/Extender: Geralyn Corwin PA RTRIDGE, JA MES Weeks in Treatment: 1 Subjective Chief Complaint Information obtained from Patient Bilateral lower extremity  wounds History of Present Illness (HPI) Admission 6/21 Kelly [name] is A 25 year old female with a past medical history of systemic lupus erythematosus that presents with bilateral lower extremity wounds that started in April 2022. She has had skin issues in the past where she developed very small wounds but these healed with time. She has never had open wounds like she does on her legs. She currently reports minimal drainage to the wounds. She does have pain to these areas but has been overall stable. She denies signs of infection. She has visited the ED for this issue and was recently prescribed doxycycline for possible cellulitis. She follows with [facility] for her SLE and is currently on Plaquenil, prednisone and CellCept. 6/27; patient presents for 1 week follow-up. She states she saw her rheumatologist on 6/22 and prednisone was increased from 10 mg to 20 mg daily. She is currently at the highest dose of Plaquenil and CellCept and is going to try an infusion later today at her regimen. She reports improvement to the wounds. She has been using collagen with dressing changes. She currently denies signs of infection. Patient History Information obtained from Patient. Family History Diabetes - Paternal Grandparents, Hypertension - Paternal Grandparents, Thyroid Problems -  Maternal Grandparents,Paternal Grandparents, No family history of Cancer, Heart Disease, Hereditary Spherocytosis, Kidney Disease, Lung Disease, Seizures, Stroke, Tuberculosis. Social History Never smoker, Marital Status - Married, Alcohol Use - Moderate, Drug Use - Current History - Marijuana, Caffeine Use - Daily - Coffee. Medical History Respiratory Patient has history of Asthma Immunological Patient has history of Lupus Erythematosus Objective Constitutional respirations regular, non-labored and within target range for patient.. Vitals Time Taken: 3:33 PM, Height: 62 in, Temperature: 98.0 F, Pulse:  92 bpm, Respiratory Rate: 16 breaths/min, Blood Pressure: 145/104 mmHg. General Notes: pain increased today due to out of hydrocodone 5/325 as of last night Cardiovascular 2+ dorsalis pedis/posterior tibialis pulses. Psychiatric pleasant and cooperative. General Notes: Left lower extremity: On the medial aspect there are 2 large open wounds with nonviable tissue But mostly granulation tissue present. Minimal serous drainage. No obvious signs of infection. Right lower extremity: Right medial side with a small open wound and nonviable tissue present. No signs of infection. Integumentary (Hair, Skin) Wound #1 status is Open. Original cause of wound was Other Lesion. The date acquired was: 01/17/2021. The wound has been in treatment 1 weeks. The wound is located on the Left,Medial Lower Leg. The wound measures 7.7cm length x 3.5cm width x 0.4cm depth; 21.166cm^2 area and 8.467cm^3 volume. There is Fat Layer (Subcutaneous Tissue) exposed. There is no tunneling or undermining noted. There is a small amount of serosanguineous drainage noted. The wound margin is well defined and not attached to the wound base. There is large (67-100%) red granulation within the wound bed. There is a small (1-33%) amount of necrotic tissue within the wound bed including Eschar and Adherent Slough. Wound #2 status is Open. Original cause of wound was Other Lesion. The date acquired was: 01/23/2021. The wound has been in treatment 1 weeks. The wound is located on the Right,Medial Lower Leg. The wound measures 1cm length x 0.9cm width x 0.4cm depth; 0.707cm^2 area and 0.283cm^3 volume. There is Fat Layer (Subcutaneous Tissue) exposed. There is no tunneling or undermining noted. There is a small amount of purulent drainage noted. The wound margin is distinct with the outline attached to the wound base. There is large (67-100%) red granulation within the wound bed. There is a small (1-33%) amount of necrotic tissue within the  wound bed including Adherent Slough. 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 I think the increase in prednisone prescribed by her rheumatologist has helped. She is using collagen daily and I recommended she continue this. I debrided slough that was in the right lower extremity wound. There was some scant slough in the left lower extremity wound that was easily removed. No signs of infection. I will see her back in 2 weeks. Procedures Wound #1 Pre-procedure diagnosis of Wound #1 is a Lupus located on the Left,Medial Lower Leg . There was a Selective/Open Wound Non-Viable Tissue Debridement with a total area of 15 sq cm performed by Geralyn Corwin, DO. With the following instrument(s): Curette to remove Non-Viable tissue/material. Material removed includes [facility] after achieving pain control using Other (Benzocaine). No specimens were taken. A time out was conducted at 15:57, prior to the start of the procedure. A Minimum amount of bleeding was controlled with Pressure. The procedure was tolerated well. Post Debridement Measurements: 7.7cm length x 3.5cm width x  0.4cm depth; 8.467cm^3 volume. Character of Wound/Ulcer Post Debridement is stable. Post procedure Diagnosis Wound #1: Same as Pre-Procedure Wound #2 Pre-procedure diagnosis of Wound #2 is a Lupus located on the Right,Medial Lower Leg . There was a Selective/Open Wound Non-Viable Tissue Debridement with a total area of 0.9 sq cm performed by Geralyn Corwin, DO. With the following instrument(s): Curette to remove Non-Viable tissue/material. Material removed includes [employer] after achieving pain control using Other (Benzocaine). No specimens were taken. A time out was  conducted at 15:57, prior to the start of the procedure. A Minimum amount of bleeding was controlled with Pressure. The procedure was tolerated well. Post Debridement Measurements: 1cm length x 0.9cm width x 0.4cm depth; 0.283cm^3 volume. Character of Wound/Ulcer Post Debridement is stable. Post procedure Diagnosis Wound #2: Same as Pre-Procedure Plan Follow-up Appointments: Return Appointment in 2 weeks. - with Dr. Mikey Bussing Other: - [facility]=Supplies Bathing/ Shower/ Hygiene: May shower and wash wound with soap and water. Edema Control - Lymphedema / SCD / Other: Avoid standing for long periods of time. Other Edema Control Orders/Instructions: - Elevate legs if swelling occurs Additional Orders / Instructions: Follow Nutritious Diet WOUND #1: - Lower Leg Wound Laterality: Left, Medial Cleanser: Soap and Water 1 x Per Day/30 Days Discharge Instructions: May shower and wash wound with dial antibacterial soap and water prior to dressing change. Topical: Skintegrity Hydrogel 4 (oz) (DME) (Generic) 1 x Per Day/30 Days Discharge Instructions: Apply hydrogel as directed Prim Dressing: Promogran Prisma Matrix, 4.34 (sq in) (silver collagen) (Generic) 1 x Per Day/30 Days ary Discharge Instructions: Moisten collagen with saline or hydrogel Secondary Dressing: Woven Gauze Sponge, Non-Sterile 4x4 in (Generic) 1 x Per Day/30 Days Discharge Instructions: Apply over primary dressing as directed. Secured With: [employer], 4.5x3.1 (in/yd) (Generic) 1 x Per Day/30 Days Discharge Instructions: Secure with Kerlix as directed. Secured With: Paper T ape, 2x10 (in/yd) (Generic) 1 x Per Day/30 Days Discharge Instructions: Secure dressing with tape as directed. WOUND #2: - Lower Leg Wound Laterality: Right, Medial Cleanser: Soap and Water 1 x Per Day/30 Days Discharge Instructions: May shower and wash wound with dial antibacterial soap and water prior to dressing change. Topical: Skintegrity Hydrogel 4  (oz) (DME) (Generic) 1 x Per Day/30 Days Discharge Instructions: Apply hydrogel as directed Prim Dressing: Promogran Prisma Matrix, 4.34 (sq in) (silver collagen) (Generic) 1 x Per Day/30 Days ary Discharge Instructions: Moisten collagen with saline or hydrogel Secondary Dressing: Woven Gauze Sponge, Non-Sterile 4x4 in (Generic) 1 x Per Day/30 Days Discharge Instructions: Apply over primary dressing as directed. Secured With: [employer], 4.5x3.1 (in/yd) (Generic) 1 x Per Day/30 Days Discharge Instructions: Secure with Kerlix as directed. Secured With: Paper T ape, 2x10 (in/yd) (Generic) 1 x Per Day/30 Days Discharge Instructions: Secure dressing with tape as directed. 1. In office sharp debridement 2. Continue collagen 3. Follow-up in 2 weeks Electronic Signature(s) Signed: 02/20/2021 4:20:56 PM By: Geralyn Corwin DO Entered By: Geralyn Corwin on 02/20/2021 16:20:23 -------------------------------------------------------------------------------- HxROS Details Patient Name: Date of Service: Kelly Milling [name] H M. 02/20/2021 3:30 PM Medical Record Number: [id number] Patient Account Number: [id number] Date of Birth/Sex: Treating RN: [birth year] (24 y.o. [name] Primary Care Provider: PA Farrell Ours MES Other Clinician: Referring Provider: Treating Provider/Extender: Geralyn Corwin PA RTRIDGE, JA MES Weeks in Treatment: 1 Information Obtained From Patient Respiratory Medical History: Positive for: Asthma Immunological Medical History: Positive for: Lupus Erythematosus Immunizations Pneumococcal Vaccine: Received Pneumococcal Vaccination: No Implantable Devices None Family and Social History Cancer:  No; Diabetes: Yes - Paternal Grandparents; Heart Disease: No; Hereditary Spherocytosis: No; Hypertension: Yes - Paternal Grandparents; Kidney Disease: No; Lung Disease: No; Seizures: No; Stroke: No; Thyroid Problems: Yes - Maternal Grandparents,Paternal  Grandparents; Tuberculosis: No; Never smoker; Marital Status - Married; Alcohol Use: Moderate; Drug Use: Current History - Marijuana; Caffeine Use: Daily - Coffee; Financial Concerns: No; Food, Clothing or Shelter Needs: No; Support System Lacking: No Electronic Signature(s) Signed: 02/20/2021 4:20:56 PM By: Geralyn Corwin DO Signed: 02/20/2021 5:08:20 PM By: Antonieta Iba Entered By: Geralyn Corwin on 02/20/2021 16:15:32 -------------------------------------------------------------------------------- SuperBill Details Patient Name: Date of Service: Kelly Milling [name] H M. 02/20/2021 Medical Record Number: [id number] Patient Account Number: [id number] Date of Birth/Sex: Treating RN: Dec 30, 1995 (24 y.o. [name] Primary Care Provider: PA Farrell Ours MES Other Clinician: Referring Provider: Treating Provider/Extender: Geralyn Corwin PA RTRIDGE, JA MES Weeks in Treatment: 1 Diagnosis Coding ICD-10 Codes Code Description L97.819 Non-pressure chronic ulcer of other part of right lower leg with unspecified severity 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 with unspecified severity M32.9 Systemic lupus erythematosus, unspecified Facility Procedures CPT4 Code: [id number] Description: [id number] - DEBRIDE WOUND 1ST 20 SQ CM OR < ICD-10 Diagnosis Description L97.819 Non-pressure chronic ulcer of other part of right lower leg with unspecified seve 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 with  unspecified sever Modifier: rity ity Quantity: 1 Physician Procedures : CPT4 Code Description Modifier [id number] - WC PHYS DEBR WO ANESTH 20 SQ CM ICD-10 Diagnosis Description L97.819 Non-pressure chronic ulcer of other part of right lower leg with unspecified severity L97.829 Non-pressure chronic ulcer of other part  of left lower leg with unspecified severity Quantity: 1 Electronic Signature(s) Signed: 02/20/2021 4:20:56 PM By: Geralyn Corwin DO Entered By: Geralyn Corwin on 02/20/2021 48:54:62

## 2021-03-06 ENCOUNTER — Encounter (HOSPITAL_BASED_OUTPATIENT_CLINIC_OR_DEPARTMENT_OTHER): Payer: BC Managed Care – PPO | Admitting: Internal Medicine

## 2021-03-13 ENCOUNTER — Encounter (HOSPITAL_BASED_OUTPATIENT_CLINIC_OR_DEPARTMENT_OTHER): Payer: BC Managed Care – PPO | Admitting: Internal Medicine

## 2021-03-16 NOTE — Progress Notes (Signed)
Kelly [name], Kelly [name] ([id number]) Visit Report for 02/20/2021 Arrival Information Details Patient Name: Date of Service: Kelly [name], Kelly [name]. 02/20/2021 3:30 PM Medical Record Number: [id number] Patient Account Number: [id number] Date of Birth/Sex: Treating RN: [birth year] (24 y.o. Kelly [name], Kelly [name] Primary Care Muneer Leider: PA Farrell Ours MES Other Clinician: Referring Asher Babilonia: Treating Sherlie Boyum/Extender: Geralyn Corwin PA RTRIDGE, JA MES Weeks in Treatment: 1 Visit Information History Since Last Visit All ordered tests and consults were completed: No Patient Arrived: Ambulatory Added or deleted any medications: No Arrival Time: 15:33 Any new allergies or adverse reactions: No Accompanied By: alone Had a fall or experienced change in No Transfer Assistance: None activities of daily living that may affect Patient Identification Verified: Yes risk of falls: Secondary Verification Process Completed: Yes Signs or symptoms of abuse/neglect since last visito No Patient Requires Transmission-Based Precautions: No Hospitalized since last visit: No Patient Has Alerts: No Implantable device outside of the clinic excluding No cellular tissue based products placed in the center since last visit: Has Dressing in Place as Prescribed: Yes Pain Present Now: No Electronic Signature(s) Signed: 03/15/2021 8:27:53 PM By: Shawn Stall Previous Signature: 03/13/2021 9:02:51 PM Version By: Shawn Stall Entered By: Shawn Stall on 03/15/2021 20:26:53 -------------------------------------------------------------------------------- Encounter Discharge Information Details Patient Name: Date of Service: Kelly [name] Kelly H M. 02/20/2021 3:30 PM Medical Record Number: [id number] Patient Account Number: [id number] Date of Birth/Sex: Treating RN: 1996/02/26 (24 y.o. Kelly [name] Primary Care Kayvion Arneson: PA [facility] MES Other Clinician: Referring Alyia Lacerte: Treating Vianca Bracher/Extender: Geralyn Corwin PA RTRIDGE, JA MES Weeks in Treatment: 1 Encounter Discharge Information Items Post Procedure Vitals Discharge Condition: Stable Temperature (F): 98.0 Ambulatory Status: Ambulatory Pulse (bpm): 92 Discharge Destination: Home Respiratory Rate (breaths/min): 16 Transportation: Private Auto Blood Pressure (mmHg): 145/104 Accompanied By: alone Schedule Follow-up Appointment: Yes Clinical Summary of Care: Patient Declined Electronic Signature(s) Signed: 02/21/2021 5:39:04 PM By: Zandra Abts RN, BSN Entered By: Zandra Abts on 02/20/2021 17:06:38 -------------------------------------------------------------------------------- Lower Extremity Assessment Details Patient Name: Date of Service: Kelly [name] Kelly H M. 02/20/2021 3:30 PM Medical Record Number: [id number] Patient Account Number: [id number] Date of Birth/Sex: Treating RN: 1996/02/15 (24 y.o. Kelly [name] Primary Care Staley Budzinski: PA [facility] MES Other Clinician: Referring Estefano Victory: Treating Bron Snellings/Extender: Geralyn Corwin PA RTRIDGE, JA MES Weeks in Treatment: 1 Edema Assessment Assessed: [Left: No] [Right: No] Edema: [Left: No] [Right: No] Calf Left: Right: Point of Measurement: 27 cm From Medial Instep 33.5 cm 33.6 cm Ankle Left: Right: Point of Measurement: 8 cm From Medial Instep 23.6 cm 22.5 cm Vascular Assessment Pulses: Dorsalis Pedis Palpable: [Left:Yes] [Right:Yes] Electronic Signature(s) Signed: 03/15/2021 8:27:53 PM By: Shawn Stall Previous Signature: 03/13/2021 9:02:51 PM Version By: Shawn Stall Entered By: Shawn Stall on 03/15/2021 20:27:11 -------------------------------------------------------------------------------- Multi Wound Chart Details Patient Name: Date of Service: Kelly [name] Kelly H M. 02/20/2021 3:30 PM Medical Record Number: [id number] Patient Account Number: [id number] Date of Birth/Sex: Treating RN: May 08, 1996 (24 y.o. Kelly [name] Primary Care  Bernita Beckstrom: PA [facility] MES Other Clinician: Referring Dodi Leu: Treating Najae Rathert/Extender: Geralyn Corwin PA RTRIDGE, JA MES Weeks in Treatment: 1 Vital Signs Height(in): 62 Pulse(bpm): 92 Weight(lbs): Blood Pressure(mmHg): 145/104 Body Mass Index(BMI): Temperature(F): 98.0 Respiratory Rate(breaths/min): 16 Photos: [1:Left, Medial Lower Leg] [2:Right, Medial Lower Leg] [N/A:N/A N/A] Wound Location: [1:Other Lesion] [2:Other Lesion] [N/A:N/A] Wounding Event: [1:Lupus] [2:Lupus] [N/A:N/A] Primary Etiology: [1:Asthma, Lupus Erythematosus] [2:Asthma, Lupus Erythematosus] [N/A:N/A] Comorbid History: [1:01/17/2021] [2:01/23/2021] [N/A:N/A] Date Acquired: [1:1] [2:1] [N/A:N/A] Weeks of Treatment: [1:Open] [2:Open] [N/A:N/A] Wound Status: [1:7.7x3.5x0.4] [  2:1x0.9x0.4] [N/A:N/A] Measurements L x W x D (cm) [1:21.166] [2:0.707] [N/A:N/A] A (cm) : rea [1:8.467] [2:0.283] [N/A:N/A] Volume (cm) : [1:14.70%] [2:-25.10%] [N/A:N/A] % Reduction in A rea: [1:-13.70%] [2:-66.50%] [N/A:N/A] % Reduction in Volume: [1:Full Thickness Without Exposed] [2:Full Thickness Without Exposed] [N/A:N/A] Classification: [1:Support Structures Small] [2:Support Structures Small] [N/A:N/A] Exudate A mount: [1:Serosanguineous] [2:Purulent] [N/A:N/A] Exudate Type: [1:red, brown] [2:yellow, brown, green] [N/A:N/A] Exudate Color: [1:Well defined, not attached] [2:Distinct, outline attached] [N/A:N/A] Wound Margin: [1:Large (67-100%)] [2:Large (67-100%)] [N/A:N/A] Granulation A mount: [1:Red] [2:Red] [N/A:N/A] Granulation Quality: [1:Small (1-33%)] [2:Small (1-33%)] [N/A:N/A] Necrotic A mount: [1:Eschar, Adherent Slough] [2:Adherent Slough] [N/A:N/A] Necrotic Tissue: [1:Fat Layer (Subcutaneous Tissue): Yes Fat Layer (Subcutaneous Tissue): Yes N/A] Exposed Structures: [1:Fascia: No Tendon: No Muscle: No Joint: No Bone: No None] [2:Fascia: No Tendon: No Muscle: No Joint: No Bone: No None]  [N/A:N/A] Epithelialization: [1:Debridement - Selective/Open Wound Debridement - Selective/Open Wound N/A] Debridement: Pre-procedure Verification/Time Out 15:57 [2:15:57] [N/A:N/A] Taken: [1:Other] [2:Other] [N/A:N/A] Pain Control: [1:Slough] [2:Slough] [N/A:N/A] Tissue Debrided: [1:Non-Viable Tissue] [2:Non-Viable Tissue] [N/A:N/A] Level: [1:15] [2:0.9] [N/A:N/A] Debridement A (sq cm): [1:rea Curette] [2:Curette] [N/A:N/A] Instrument: [1:Minimum] [2:Minimum] [N/A:N/A] Bleeding: [1:Pressure] [2:Pressure] [N/A:N/A] Hemostasis A chieved: [1:Procedure was tolerated well] [2:Procedure was tolerated well] [N/A:N/A] Debridement Treatment Response: [1:7.7x3.5x0.4] [2:1x0.9x0.4] [N/A:N/A] Post Debridement Measurements L x W x D (cm) [1:8.467] [2:0.283] [N/A:N/A] Post Debridement Volume: (cm) [1:Debridement] [2:Debridement] [N/A:N/A] Treatment Notes Electronic Signature(s) Signed: 02/20/2021 4:20:56 PM By: Geralyn Corwin DO Signed: 02/20/2021 5:08:20 PM By: Antonieta Iba Entered By: Geralyn Corwin on 02/20/2021 16:14:02 -------------------------------------------------------------------------------- Multi-Disciplinary Care Plan Details Patient Name: Date of Service: Kelly [name] Kelly H M. 02/20/2021 3:30 PM Medical Record Number: [id number] Patient Account Number: [id number] Date of Birth/Sex: Treating RN: [birth year] (24 y.o. Kelly [name] Primary Care Corianne Buccellato: PA [facility] MES Other Clinician: Referring Ester Mabe: Treating Ellinor Test/Extender: Geralyn Corwin PA RTRIDGE, JA MES Weeks in Treatment: 1 Active Inactive Orientation to the Wound Care Program Nursing Diagnoses: Knowledge deficit related to the wound healing center program Goals: Patient/caregiver will verbalize understanding of the Wound Healing Center Program Date Initiated: 02/13/2021 Target Resolution Date: 03/13/2021 Goal Status: Active Interventions: Provide education on orientation to the wound  center Notes: Wound/Skin Impairment Nursing Diagnoses: Impaired tissue integrity Goals: Patient/caregiver will verbalize understanding of skin care regimen Date Initiated: 02/13/2021 Target Resolution Date: 03/13/2021 Goal Status: Active Ulcer/skin breakdown will have a volume reduction of 30% by week 4 Date Initiated: 02/13/2021 Target Resolution Date: 03/13/2021 Goal Status: Active Interventions: Assess patient/caregiver ability to obtain necessary supplies Assess patient/caregiver ability to perform ulcer/skin care regimen upon admission and as needed Assess ulceration(s) every visit Provide education on ulcer and skin care Treatment Activities: Skin care regimen initiated : 02/13/2021 Topical wound management initiated : 02/13/2021 Notes: Electronic Signature(s) Signed: 02/20/2021 4:16:08 PM By: Antonieta Iba Entered By: Antonieta Iba on 02/20/2021 16:16:08 -------------------------------------------------------------------------------- Pain Assessment Details Patient Name: Date of Service: Kelly [name] Kelly H M. 02/20/2021 3:30 PM Medical Record Number: [id number] Patient Account Number: [id number] Date of Birth/Sex: Treating RN: 15-Apr-1996 (24 y.o. Kelly [name] Primary Care Nita Whitmire: PA [facility] MES Other Clinician: Referring Andersyn Fragoso: Treating Elli Groesbeck/Extender: Geralyn Corwin PA RTRIDGE, JA MES Weeks in Treatment: 1 Active Problems Location of Pain Severity and Description of Pain Patient Has Paino No Site Locations With Dressing Change: Yes Rate the pain. Current Pain Level: 8 Worst Pain Level: 10 Least Pain Level: 4 Tolerable Pain Level: 5 Character of Pain Describe the Pain: Aching, Difficult to Pinpoint Pain Management and Medication Current Pain Management: Medication:  Yes Cold Application: No Rest: No Massage: No Activity: No T.E.N.S.: No Heat Application: No Leg drop or elevation: No Is the Current Pain Management Adequate: Inadequate How  does your wound impact your activities of daily livingo Sleep: Yes Bathing: No Appetite: No Relationship With Others: No Bladder Continence: No Emotions: No Bowel Continence: No Work: No Toileting: No Drive: No Dressing: No Hobbies: No Notes has lupus - rheumatologist aware of wounds Electronic Signature(s) Signed: 03/15/2021 8:27:53 PM By: Shawn Stall Previous Signature: 03/13/2021 9:02:51 PM Version By: Shawn Stall Entered By: Shawn Stall on 03/15/2021 20:27:05 -------------------------------------------------------------------------------- Patient/Caregiver Education Details Patient Name: Date of Service: Kelly [name] 6/27/2022andnbsp3:30 PM Medical Record Number: [id number] Patient Account Number: [id number] Date of Birth/Gender: Treating RN: Dec 06, 1995 (24 y.o. Kelly [name] Primary Care Physician: PA [facility] MES Other Clinician: Referring Physician: Treating Physician/Extender: Geralyn Corwin PA RTRIDGE, JA MES Weeks in Treatment: 1 Education Assessment Education Provided To: Patient Education Topics Provided Nutrition: Methods: Explain/Verbal, Printed Responses: State content correctly Wound/Skin Impairment: Methods: Demonstration, Explain/Verbal, Printed Responses: State content correctly Electronic Signature(s) Signed: 02/20/2021 5:08:20 PM By: Antonieta Iba Entered By: Antonieta Iba on 02/20/2021 16:16:35 -------------------------------------------------------------------------------- Wound Assessment Details Patient Name: Date of Service: Kelly [name] Kelly H M. 02/20/2021 3:30 PM Medical Record Number: [id number] Patient Account Number: [id number] Date of Birth/Sex: Treating RN: 1996/07/27 (24 y.o. Kelly [name], Kelly [name] Primary Care Shonia Skilling: PA [facility] MES Other Clinician: Referring Tayvien Kane: Treating Briannah Lona/Extender: Geralyn Corwin PA RTRIDGE, JA MES Weeks in Treatment: 1 Wound Status Wound Number: 1 Primary Etiology:  Lupus Wound Location: Left, Medial Lower Leg Wound Status: Open Wounding Event: Other Lesion Comorbid History: Asthma, Lupus Erythematosus Date Acquired: 01/17/2021 Weeks Of Treatment: 1 Clustered Wound: No Photos Wound Measurements Length: (cm) 7.7 Width: (cm) 3.5 Depth: (cm) 0.4 Area: (cm) 21.166 Volume: (cm) 8.467 % Reduction in Area: 14.7% % Reduction in Volume: -13.7% Epithelialization: None Tunneling: No Undermining: No Wound Description Classification: Full Thickness Without Exposed Support Structures Wound Margin: Well defined, not attached Exudate Amount: Small Exudate Type: Serosanguineous Exudate Color: red, brown Foul Odor After Cleansing: No Slough/Fibrino Yes Wound Bed Granulation Amount: Large (67-100%) Exposed Structure Granulation Quality: Red Fascia Exposed: No Necrotic Amount: Small (1-33%) Fat Layer (Subcutaneous Tissue) Exposed: Yes Necrotic Quality: Eschar, Adherent Slough Tendon Exposed: No Muscle Exposed: No Joint Exposed: No Bone Exposed: No Treatment Notes Wound #1 (Lower Leg) Wound Laterality: Left, Medial Cleanser Soap and Water Discharge Instruction: May shower and wash wound with dial antibacterial soap and water prior to dressing change. Peri-Wound Care Topical Skintegrity Hydrogel 4 (oz) Discharge Instruction: Apply hydrogel as directed Primary Dressing Promogran Prisma Matrix, 4.34 (sq in) (silver collagen) Discharge Instruction: Moisten collagen with saline or hydrogel Secondary Dressing Woven Gauze Sponge, Non-Sterile 4x4 in Discharge Instruction: Apply over primary dressing as directed. Secured With [employer], 4.5x3.1 (in/yd) Discharge Instruction: Secure with Kerlix as directed. Paper Tape, 2x10 (in/yd) Discharge Instruction: Secure dressing with tape as directed. Compression Wrap Compression Stockings Add-Ons Electronic Signature(s) Signed: 03/15/2021 8:27:53 PM By: Shawn Stall Previous Signature:  02/20/2021 4:13:19 PM Version By: Karl Ito Previous Signature: 03/13/2021 9:02:51 PM Version By: Shawn Stall Entered By: Shawn Stall on 03/15/2021 20:27:23 -------------------------------------------------------------------------------- Wound Assessment Details Patient Name: Date of Service: Kelly [name] Kelly H M. 02/20/2021 3:30 PM Medical Record Number: [id number] Patient Account Number: [id number] Date of Birth/Sex: Treating RN: 02/12/1996 (24 y.o. Kelly [name] Primary Care Rainen Vanrossum: PA [facility] Ours MES Other Clinician: Referring Aeneas Longsworth: Treating Khalib Fendley/Extender: Geralyn Corwin PA RTRIDGE,  JA MES Weeks in Treatment: 1 Wound Status Wound Number: 2 Primary Etiology: Lupus Wound Location: Right, Medial Lower Leg Wound Status: Open Wounding Event: Other Lesion Comorbid History: Asthma, Lupus Erythematosus Date Acquired: 01/23/2021 Weeks Of Treatment: 1 Clustered Wound: No Photos Wound Measurements Length: (cm) 1 Width: (cm) 0.9 Depth: (cm) 0.4 Area: (cm) 0.707 Volume: (cm) 0.283 % Reduction in Area: -25.1% % Reduction in Volume: -66.5% Epithelialization: None Tunneling: No Undermining: No Wound Description Classification: Full Thickness Without Exposed Support Struct Wound Margin: Distinct, outline attached Exudate Amount: Small Exudate Type: Purulent Exudate Color: yellow, brown, green ures Foul Odor After Cleansing: No Slough/Fibrino Yes Wound Bed Granulation Amount: Large (67-100%) Exposed Structure Granulation Quality: Red Fascia Exposed: No Necrotic Amount: Small (1-33%) Fat Layer (Subcutaneous Tissue) Exposed: Yes Necrotic Quality: Adherent Slough Tendon Exposed: No Muscle Exposed: No Joint Exposed: No Bone Exposed: No Treatment Notes Wound #2 (Lower Leg) Wound Laterality: Right, Medial Cleanser Soap and Water Discharge Instruction: May shower and wash wound with dial antibacterial soap and water prior to dressing  change. Peri-Wound Care Topical Skintegrity Hydrogel 4 (oz) Discharge Instruction: Apply hydrogel as directed Primary Dressing Promogran Prisma Matrix, 4.34 (sq in) (silver collagen) Discharge Instruction: Moisten collagen with saline or hydrogel Secondary Dressing Woven Gauze Sponge, Non-Sterile 4x4 in Discharge Instruction: Apply over primary dressing as directed. Secured With [employer]erl[employer] Roll Sterile, 4.5x3.1 (in/yd) Discharge Instruction: Secure with Kerlix as directed. Paper Tape, 2x10 (in/yd) Discharge Instruction: Secure dressing with tape as directed. Compression Wrap Compression Stockings Add-Ons Electronic Signature(s) Signed: 03/15/2021 8:27:53 PM By: Shawn Stalleaton, [name] Previous Signature: 02/20/2021 4:13:19 PM Version By: Karl Itoawkins, Destiny Previous Signature: 03/13/2021 9:02:51 PM Version By: Shawn Stalleaton, [name] Entered By: Shawn Stalleaton, [name] on 03/15/2021 20:27:36 -------------------------------------------------------------------------------- Vitals Details Patient Name: Date of Service: Kelly MillingNEWKIRK, Kelly Kelly H M. 02/20/2021 3:30 PM Medical Record Number: [id number] Patient Account Number: [id number] Date of Birth/Sex: Treating RN: 07-05-1996 (24 y.o. Kelly PickettF) [name], Millard.LoaBobbi Primary Care Naysha Sholl: Other Clinician: PA Farrell OursTRIDGE, JA MES Referring Timmia Cogburn: Treating Eural Holzschuh/Extender: Geralyn CorwinHoffman, Jessica PA RTRIDGE, JA MES Weeks in Treatment: 1 Vital Signs Time Taken: 15:33 Temperature (F): 98.0 Height (in): 62 Pulse (bpm): 92 Respiratory Rate (breaths/min): 16 Blood Pressure (mmHg): 145/104 Reference Range: 80 - 120 mg / dl Notes pain increased today due to out of hydrocodone 5/325 as of last night Electronic Signature(s) Signed: 03/15/2021 8:27:53 PM By: Shawn Stalleaton, [name] Previous Signature: 03/13/2021 9:02:51 PM Version By: Shawn Stalleaton, [name] Entered By: Shawn Stalleaton, [name] on 03/15/2021 20:26:59

## 2021-04-07 DIAGNOSIS — M329 Systemic lupus erythematosus, unspecified: Secondary | ICD-10-CM | POA: Diagnosis not present

## 2021-04-07 DIAGNOSIS — M25472 Effusion, left ankle: Secondary | ICD-10-CM | POA: Diagnosis not present

## 2021-04-10 ENCOUNTER — Ambulatory Visit: Payer: BC Managed Care – PPO

## 2021-04-11 ENCOUNTER — Other Ambulatory Visit: Payer: Self-pay

## 2021-04-11 DIAGNOSIS — Z30013 Encounter for initial prescription of injectable contraceptive: Secondary | ICD-10-CM

## 2021-04-11 MED ORDER — MEDROXYPROGESTERONE ACETATE 150 MG/ML IM SUSP: 150.0000 mg | INTRAMUSCULAR | 0 refills | Status: DC

## 2021-04-13 ENCOUNTER — Ambulatory Visit (INDEPENDENT_AMBULATORY_CARE_PROVIDER_SITE_OTHER): Payer: BC Managed Care – PPO

## 2021-04-13 ENCOUNTER — Other Ambulatory Visit: Payer: Self-pay

## 2021-04-13 DIAGNOSIS — Z3042 Encounter for surveillance of injectable contraceptive: Secondary | ICD-10-CM | POA: Diagnosis not present

## 2021-04-13 NOTE — Progress Notes (Signed)
Pt is in the office for depo injection, administered in RUOQ and pt tolerated well. Next depo due Nov 3-17. .. Administrations This Visit     medroxyPROGESTERone (DEPO-PROVERA) injection 150 mg     Admin Date 04/13/2021 Action Given Dose 150 mg Route Intramuscular Administered By Katrina Stack, RN

## 2021-04-14 DIAGNOSIS — M3219 Other organ or system involvement in systemic lupus erythematosus: Secondary | ICD-10-CM | POA: Diagnosis not present

## 2021-04-14 DIAGNOSIS — L03116 Cellulitis of left lower limb: Secondary | ICD-10-CM | POA: Diagnosis not present

## 2021-04-17 DIAGNOSIS — M329 Systemic lupus erythematosus, unspecified: Secondary | ICD-10-CM | POA: Diagnosis not present

## 2021-04-17 DIAGNOSIS — L03116 Cellulitis of left lower limb: Secondary | ICD-10-CM | POA: Diagnosis not present

## 2021-04-17 DIAGNOSIS — M25571 Pain in right ankle and joints of right foot: Secondary | ICD-10-CM | POA: Diagnosis not present

## 2021-04-17 DIAGNOSIS — S86091A Other specified injury of right Achilles tendon, initial encounter: Secondary | ICD-10-CM | POA: Diagnosis not present

## 2021-04-20 DIAGNOSIS — M3210 Systemic lupus erythematosus, organ or system involvement unspecified: Secondary | ICD-10-CM | POA: Diagnosis not present

## 2021-04-20 DIAGNOSIS — L930 Discoid lupus erythematosus: Secondary | ICD-10-CM | POA: Diagnosis not present

## 2021-04-21 ENCOUNTER — Encounter (HOSPITAL_BASED_OUTPATIENT_CLINIC_OR_DEPARTMENT_OTHER): Payer: BC Managed Care – PPO | Attending: Internal Medicine | Admitting: Internal Medicine

## 2021-04-21 ENCOUNTER — Other Ambulatory Visit: Payer: Self-pay

## 2021-04-21 DIAGNOSIS — Z8249 Family history of ischemic heart disease and other diseases of the circulatory system: Secondary | ICD-10-CM | POA: Insufficient documentation

## 2021-04-21 DIAGNOSIS — S91002A Unspecified open wound, left ankle, initial encounter: Secondary | ICD-10-CM

## 2021-04-21 DIAGNOSIS — M329 Systemic lupus erythematosus, unspecified: Secondary | ICD-10-CM | POA: Diagnosis not present

## 2021-04-21 DIAGNOSIS — X58XXXA Exposure to other specified factors, initial encounter: Secondary | ICD-10-CM | POA: Diagnosis not present

## 2021-04-21 DIAGNOSIS — Z833 Family history of diabetes mellitus: Secondary | ICD-10-CM | POA: Insufficient documentation

## 2021-04-21 DIAGNOSIS — Z8349 Family history of other endocrine, nutritional and metabolic diseases: Secondary | ICD-10-CM | POA: Insufficient documentation

## 2021-04-21 DIAGNOSIS — S81802A Unspecified open wound, left lower leg, initial encounter: Secondary | ICD-10-CM | POA: Diagnosis not present

## 2021-04-21 NOTE — Progress Notes (Signed)
Kelly [name], Kelly [name] ([id number]) Visit Report for 04/21/2021 Chief Complaint Document Details Patient Name: Date of Service: Kelly [name], Kelly [name]. 04/21/2021 8:30 A M Medical Record Number: [id number] Patient Account Number: [id number] Date of Birth/Sex: Treating RN: [birth year] (25 y.o. Tommye Standard Primary Care Provider: Dione Booze Other Clinician: Referring Provider: Treating Provider/Extender: Gennaro Africa in Treatment: 9 Information Obtained from: Patient Chief Complaint Bilateral lower extremity wounds 04/21/21: left lower extremity wound s/p cellulitis Electronic Signature(s) Signed: 04/21/2021 10:09:17 AM By: Geralyn Corwin DO Entered By: Geralyn Corwin on 04/21/2021 09:57:50 -------------------------------------------------------------------------------- Debridement Details Patient Name: Date of Service: Kelly Milling [name] 04/21/2021 8:30 A M Medical Record Number: [id number] Patient Account Number: [id number] Date of Birth/Sex: Treating RN: [birth year] (25 y.o. Tommye Standard Primary Care Provider: Dione Booze Other Clinician: Referring Provider: Treating Provider/Extender: Gennaro Africa in Treatment: 9 Debridement Performed for Assessment: Wound #1 Left,Medial Lower Leg Performed By: Clinician Zandra Abts, RN Debridement Type: Chemical/Enzymatic/Mechanical Agent Used: Santyl Level of Consciousness (Pre-procedure): Awake and Alert Pre-procedure Verification/Time Out No Taken: Bleeding: None Response to Treatment: Procedure was tolerated well Level of Consciousness (Post- Awake and Alert procedure): Post Debridement Measurements of Total Wound Length: (cm) 0.1 Width: (cm) 0.1 Depth: (cm) 0.1 Volume: (cm) 0.001 Character of Wound/Ulcer Post Debridement: Improved Post Procedure Diagnosis Same as Pre-procedure Electronic Signature(s) Signed: 04/21/2021 10:09:17 AM By: Geralyn Corwin  DO Signed: 04/21/2021 1:08:00 PM By: Zenaida Deed RN, BSN Entered By: Zenaida Deed on 04/21/2021 10:05:25 -------------------------------------------------------------------------------- Debridement Details Patient Name: Date of Service: Kelly Milling [name] 04/21/2021 8:30 A M Medical Record Number: [id number] Patient Account Number: [id number] Date of Birth/Sex: Treating RN: April 30, 1996 (25 y.o. Tommye Standard Primary Care Provider: Dione Booze Other Clinician: Referring Provider: Treating Provider/Extender: Gennaro Africa in Treatment: 9 Debridement Performed for Assessment: Wound #3 Left,Posterior Lower Leg Performed By: Clinician Zandra Abts, RN Debridement Type: Chemical/Enzymatic/Mechanical Agent Used: Santyl Level of Consciousness (Pre-procedure): Awake and Alert Pre-procedure Verification/Time Out No Taken: Bleeding: None Response to Treatment: Procedure was tolerated well Level of Consciousness (Post- Awake and Alert procedure): Post Debridement Measurements of Total Wound Length: (cm) 1.3 Width: (cm) 1.2 Depth: (cm) 1.2 Volume: (cm) 1.47 Character of Wound/Ulcer Post Debridement: Improved Post Procedure Diagnosis Same as Pre-procedure Electronic Signature(s) Signed: 04/21/2021 10:09:17 AM By: Geralyn Corwin DO Signed: 04/21/2021 1:08:00 PM By: Zenaida Deed RN, BSN Entered By: Zenaida Deed on 04/21/2021 10:05:53 -------------------------------------------------------------------------------- Debridement Details Patient Name: Date of Service: Kelly Milling [name] 04/21/2021 8:30 A M Medical Record Number: [id number] Patient Account Number: [id number] Date of Birth/Sex: Treating RN: [birth year] (25 y.o. Tommye Standard Primary Care Provider: Dione Booze Other Clinician: Referring Provider: Treating Provider/Extender: Gennaro Africa in Treatment: 9 Debridement Performed for  Assessment: Wound #4 Left,Lateral Ankle Performed By: Clinician Zandra Abts, RN Debridement Type: Chemical/Enzymatic/Mechanical Agent Used: Santyl Level of Consciousness (Pre-procedure): Awake and Alert Pre-procedure Verification/Time Out No Taken: Bleeding: None Response to Treatment: Procedure was tolerated well Level of Consciousness (Post- Awake and Alert procedure): Post Debridement Measurements of Total Wound Length: (cm) 4 Width: (cm) 5 Depth: (cm) 0.4 Volume: (cm) 6.283 Character of Wound/Ulcer Post Debridement: Requires Further Debridement Post Procedure Diagnosis Same as Pre-procedure Electronic Signature(s) Signed: 04/21/2021 10:09:17 AM By: Geralyn Corwin DO Signed: 04/21/2021 1:08:00 PM By: Zenaida Deed RN, BSN Entered By: Zenaida Deed on 04/21/2021 10:06:17 -------------------------------------------------------------------------------- Debridement Details Patient Name: Date of Service: Kelly Milling [name] 04/21/2021 8:30 A  M Medical Record Number: [id number] Patient Account Number: [id number] Date of Birth/Sex: Treating RN: Oct 26, 1995 (25 y.o. Tommye StandardF) Boehlein, [name] Primary Care Provider: Dione BoozePartridge, James Other Clinician: Referring Provider: Treating Provider/Extender: Gennaro AfricaHoffman, Meghna Hagmann Partridge, James Weeks in Treatment: 9 Debridement Performed for Assessment: Wound #5 Left,Anterior Ankle Performed By: Clinician Zandra AbtsLynch, [name], RN Debridement Type: Chemical/Enzymatic/Mechanical Agent Used: Santyl Level of Consciousness (Pre-procedure): Awake and Alert Pre-procedure Verification/Time Out No Taken: Bleeding: None Response to Treatment: Procedure was tolerated well Level of Consciousness (Post- Awake and Alert procedure): Post Debridement Measurements of Total Wound Length: (cm) 1 Width: (cm) 1.2 Depth: (cm) 0.2 Volume: (cm) 0.188 Character of Wound/Ulcer Post Debridement: Requires Further Debridement Post Procedure Diagnosis Same as  Pre-procedure Electronic Signature(s) Signed: 04/21/2021 10:09:17 AM By: Geralyn CorwinHoffman, Jaydee Conran DO Signed: 04/21/2021 1:08:00 PM By: Zenaida DeedBoehlein, [name] RN, BSN Entered By: Zenaida DeedBoehlein, [name] on 04/21/2021 10:06:44 -------------------------------------------------------------------------------- Debridement Details Patient Name: Date of Service: Kelly MillingNEWKIRK, [name] 04/21/2021 8:30 A M Medical Record Number: [id number] Patient Account Number: [id number] Date of Birth/Sex: Treating RN: Oct 26, 1995 (25 y.o. Tommye StandardF) Boehlein, [name] Primary Care Provider: Dione BoozePartridge, James Other Clinician: Referring Provider: Treating Provider/Extender: Gennaro AfricaHoffman, Zi Sek Partridge, James Weeks in Treatment: 9 Debridement Performed for Assessment: Wound #6 Left,Proximal Lower Leg Performed By: Clinician Zandra AbtsLynch, [name], RN Debridement Type: Chemical/Enzymatic/Mechanical Agent Used: Santyl Level of Consciousness (Pre-procedure): Awake and Alert Pre-procedure Verification/Time Out No Taken: Bleeding: None Response to Treatment: Procedure was tolerated well Level of Consciousness (Post- Awake and Alert procedure): Post Debridement Measurements of Total Wound Length: (cm) 1 Width: (cm) 0.8 Depth: (cm) 0.2 Volume: (cm) 0.126 Character of Wound/Ulcer Post Debridement: Requires Further Debridement Post Procedure Diagnosis Same as Pre-procedure Electronic Signature(s) Signed: 04/21/2021 10:09:17 AM By: Geralyn CorwinHoffman, Destry Dauber DO Signed: 04/21/2021 1:08:00 PM By: Zenaida DeedBoehlein, [name] RN, BSN Entered By: Zenaida DeedBoehlein, [name] on 04/21/2021 10:07:10 -------------------------------------------------------------------------------- HPI Details Patient Name: Date of Service: Kelly MillingNEWKIRK, [name] 04/21/2021 8:30 A M Medical Record Number: [id number] Patient Account Number: [id number] Date of Birth/Sex: Treating RN: Oct 26, 1995 (25 y.o. Tommye StandardF) Boehlein, [name] Primary Care Provider: Dione BoozePartridge, James Other Clinician: Referring Provider: Treating  Provider/Extender: Gennaro AfricaHoffman, Kyrie Bun Partridge, James Weeks in Treatment: 9 History of Present Illness HPI Description: Admission 6/21 Ms. Hassel NethJemariah Newkrik is A 25 year old female with a past medical history of systemic lupus erythematosus that presents with bilateral lower extremity wounds that started in April 2022. She has had skin issues in the past where she developed very small wounds but these healed with time. She has never had open wounds like she does on her legs. She currently reports minimal drainage to the wounds. She does have pain to these areas but has been overall stable. She denies signs of infection. She has visited the ED for this issue and was recently prescribed doxycycline for possible cellulitis. She follows with [facility] for her SLE and is currently on Plaquenil, prednisone and CellCept. 6/27; patient presents for 1 week follow-up. She states she saw her rheumatologist on 6/22 and prednisone was increased from 10 mg to 20 mg daily. She is currently at the highest dose of Plaquenil and CellCept and is going to try an infusion later today at her regimen. She reports improvement to the wounds. She has been using collagen with dressing changes. She currently denies signs of infection. Readmission 8/26 Patient presents to clinic today for open wounds to her left lower extremity. Her previous wounds that were treated have closed. She states that on 8/9 she developed cellulitis and was treated with 2 rounds of antibiotics. Her cellulitis  has resolved. She had blisters that opened and are now large wounds. She has seen her dermatologist and rheumatologist since she has systemic lupus and a hard time healing. No changes have been made to her medications. She was recommended antibiotic ointment by her dermatologist to her wounds She picked up the antibiotic ointment today.. She reports pain to these areas. She denies purulent drainage, increased warmth or erythema to the left  lower extremity. She currently keeps the areas covered. Of note she had an Achilles tendon rupture on her right leg and is currently in a soft cast. Electronic Signature(s) Signed: 04/21/2021 10:09:17 AM By: Geralyn Corwin DO Entered By: Geralyn Corwin on 04/21/2021 10:04:00 -------------------------------------------------------------------------------- Physical Exam Details Patient Name: Date of Service: Kelly Milling [name] 04/21/2021 8:30 A M Medical Record Number: [id number] Patient Account Number: [id number] Date of Birth/Sex: Treating RN: 11/10/1995 (25 y.o. Tommye Standard Primary Care Provider: Dione Booze Other Clinician: Referring Provider: Treating Provider/Extender: Gennaro Africa in Treatment: 9 Constitutional respirations regular, non-labored and within target range for patient.Marland Kitchen Psychiatric pleasant and cooperative. Notes Left lower extremity: 4 wounds present. 3 are eschared and 1 is open with granulation tissue and slough. No obvious signs of infection. Electronic Signature(s) Signed: 04/21/2021 10:09:17 AM By: Geralyn Corwin DO Entered By: Geralyn Corwin on 04/21/2021 10:04:39 -------------------------------------------------------------------------------- Physician Orders Details Patient Name: Date of Service: Kelly Milling [name] 04/21/2021 8:30 A M Medical Record Number: [id number] Patient Account Number: [id number] Date of Birth/Sex: Treating RN: 1996-05-06 (25 y.o. [name], [name] Primary Care Provider: Dione Booze Other Clinician: Referring Provider: Treating Provider/Extender: Gennaro Africa in Treatment: 9 Verbal / Phone Orders: No Diagnosis Coding ICD-10 Coding Code Description L97.819 Non-pressure chronic ulcer of other part of right lower leg with unspecified severity 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 with unspecified severity M32.9 Systemic  lupus erythematosus, unspecified Follow-up Appointments Return A ppointment in 1 week. Other: - [place]=Supplies Bathing/ Shower/ Hygiene May shower and wash wound with soap and water. Edema Control - Lymphedema / SCD / Other Avoid standing for long periods of time. Other Edema Control Orders/Instructions: - Elevate legs if swelling occurs Additional Orders / Instructions Follow Nutritious Diet Wound Treatment Wound #1 - Lower Leg Wound Laterality: Left, Medial Prim Dressing: Santyl Ointment 1 x Per Day/30 Days ary Discharge Instructions: Apply nickel thick amount to wound bed as instructed Prim Dressing: Skintegrity Hydrogel, 4 (oz) (DME) (Generic) 1 x Per Day/30 Days ary Discharge Instructions: apply thin layer over santyl Secondary Dressing: Woven Gauze Sponge, Non-Sterile 4x4 in (DME) (Generic) 1 x Per Day/30 Days Discharge Instructions: Apply over primary dressing as directed. Secured With: [employer], 4.5x3.1 (in/yd) (DME) (Generic) 1 x Per Day/30 Days Discharge Instructions: Secure with Kerlix as directed. Secured With: Paper Tape, 2x10 (in/yd) (DME) (Generic) 1 x Per Day/30 Days Discharge Instructions: Secure dressing with tape as directed. Wound #3 - Lower Leg Wound Laterality: Left, Posterior Prim Dressing: Santyl Ointment 1 x Per Day/30 Days ary Discharge Instructions: Apply nickel thick amount to wound bed as instructed Prim Dressing: Skintegrity Hydrogel, 4 (oz) (DME) (Generic) 1 x Per Day/30 Days ary Discharge Instructions: apply thin layer over santyl Secondary Dressing: Woven Gauze Sponge, Non-Sterile 4x4 in (DME) (Generic) 1 x Per Day/30 Days Discharge Instructions: Apply over primary dressing as directed. Secured With: [employer], 4.5x3.1 (in/yd) (DME) (Generic) 1 x Per Day/30 Days Discharge Instructions: Secure with Kerlix as directed. Secured With: Paper  Tape, 2x10 (in/yd) (DME) (Generic) 1 x Per Day/30 Days Discharge Instructions: Secure  dressing with tape as directed. Wound #4 - Ankle Wound Laterality: Left, Lateral Prim Dressing: Santyl Ointment 1 x Per Day/30 Days ary Discharge Instructions: Apply nickel thick amount to wound bed as instructed Prim Dressing: Skintegrity Hydrogel, 4 (oz) (DME) (Generic) 1 x Per Day/30 Days ary Discharge Instructions: apply thin layer over santyl Secondary Dressing: Woven Gauze Sponge, Non-Sterile 4x4 in (DME) (Generic) 1 x Per Day/30 Days Discharge Instructions: Apply over primary dressing as directed. Secured With: [employer], 4.5x3.1 (in/yd) (DME) (Generic) 1 x Per Day/30 Days Discharge Instructions: Secure with Kerlix as directed. Secured With: Paper Tape, 2x10 (in/yd) (DME) (Generic) 1 x Per Day/30 Days Discharge Instructions: Secure dressing with tape as directed. Wound #5 - Ankle Wound Laterality: Left, Anterior Prim Dressing: Santyl Ointment 1 x Per Day/30 Days ary Discharge Instructions: Apply nickel thick amount to wound bed as instructed Prim Dressing: Skintegrity Hydrogel, 4 (oz) (DME) (Generic) 1 x Per Day/30 Days ary Discharge Instructions: apply thin layer over santyl Secondary Dressing: Woven Gauze Sponge, Non-Sterile 4x4 in (DME) (Generic) 1 x Per Day/30 Days Discharge Instructions: Apply over primary dressing as directed. Secured With: [employer], 4.5x3.1 (in/yd) (DME) (Generic) 1 x Per Day/30 Days Discharge Instructions: Secure with Kerlix as directed. Secured With: Paper Tape, 2x10 (in/yd) (DME) (Generic) 1 x Per Day/30 Days Discharge Instructions: Secure dressing with tape as directed. Wound #6 - Lower Leg Wound Laterality: Left, Proximal Prim Dressing: Santyl Ointment 1 x Per Day/30 Days ary Discharge Instructions: Apply nickel thick amount to wound bed as instructed Prim Dressing: Skintegrity Hydrogel, 4 (oz) (DME) (Generic) 1 x Per Day/30 Days ary Discharge Instructions: apply thin layer over santyl Secondary Dressing: Woven Gauze Sponge,  Non-Sterile 4x4 in (DME) (Generic) 1 x Per Day/30 Days Discharge Instructions: Apply over primary dressing as directed. Secured With: [employer], 4.5x3.1 (in/yd) (DME) (Generic) 1 x Per Day/30 Days Discharge Instructions: Secure with Kerlix as directed. Secured With: Paper Tape, 2x10 (in/yd) (DME) (Generic) 1 x Per Day/30 Days Discharge Instructions: Secure dressing with tape as directed. Services and Therapies rterial Studies- Unilateral left lower leg with ABI and TBI - necrotic ulcers left lower leg, systemic lupus - (ICD10 L97.829 - Non-pressure chronic A ulcer of other part of left lower leg with unspecified severity) Patient Medications llergies: No Known Allergies A Notifications Medication Indication Start End 04/21/2021 Santyl DOSE topical 250 unit/gram ointment - ointment topical apply nickle thick to left leg wounds daily Electronic Signature(s) Signed: 04/21/2021 10:23:21 AM By: Geralyn Corwin DO Signed: 04/21/2021 1:08:00 PM By: Zenaida Deed RN, BSN Previous Signature: 04/21/2021 10:09:17 AM Version By: Geralyn Corwin DO Entered By: Zenaida Deed on 04/21/2021 10:14:30 Prescription 04/21/2021 -------------------------------------------------------------------------------- Thornell Mule M. Geralyn Corwin DO Patient Name: Provider: 10-Mar-1996 [id number] Date of Birth: NPI#: [id number] Sex: DEA #: [phone] Phone #: License #: Eligha Bridegroom [facility] Patient Address: [address] [place], [place] [address] [address] [place], [place] [address] [phone] Allergies No Known Allergies Provider's Orders rterial Studies- Unilateral left lower leg with ABI and TBI - ICD10: L97.829 - necrotic ulcers left lower leg, systemic lupus A Hand Signature: Date(s): Prescription 04/21/2021 Sheilia, Reznick [facility] M. Geralyn Corwin DO Patient Name: Provider: [birth year] [id number] Date of  Birth: NPI#: [id number] Sex: DEA #: [phone] Phone #: License #: Eligha Bridegroom [facility] Patient Address: [id number] [place], [place] [address] [address] [place], [place] [address] [phone] Allergies No Known Allergies Medication Medication: Route: Strength: Form: Santyl 250 unit/gram topical ointment topical 250 unit/gram ointment Class: TOPICAL/MUCOUS MEMBR./SUBCUT ENZYMES . Dose: Frequency / Time: Indication: ointment topical apply nickle thick to left leg wounds daily Number of Refills: Number of Units: 3 Thirty (30) Generic Substitution: Start Date: End Date: One Time Use: Substitution Permitted 04/21/2021 No Note to Pharmacy: Hand Signature: Date(s): Electronic Signature(s) Signed: 04/21/2021 10:23:21 AM By: Geralyn Corwin DO Signed: 04/21/2021 1:08:00 PM By: Zenaida Deed RN, BSN Previous Signature: 04/21/2021 10:09:17 AM Version By: Geralyn Corwin DO Entered By: Zenaida Deed on 04/21/2021 10:14:31 -------------------------------------------------------------------------------- Problem List Details Patient Name: Date of Service: Kelly Milling [name] 04/21/2021 8:30 A M Medical Record Number: [id number] Patient Account Number: [id number] Date of Birth/Sex: Treating RN: Feb 03, 1996 (25 y.o. Tommye Standard Primary Care Provider: Dione Booze Other Clinician: Referring Provider: Treating Provider/Extender: Gennaro Africa in Treatment: 9 Active Problems ICD-10 Encounter Code Description Active Date MDM Diagnosis S81.802A Unspecified open wound, left lower leg, initial encounter 04/21/2021 No Yes S91.002A Unspecified open wound, left ankle, initial encounter 04/21/2021 No Yes M32.9 Systemic lupus erythematosus, unspecified 02/14/2021 No Yes Inactive Problems ICD-10 Code Description Active Date Inactive Date L97.819 Non-pressure chronic ulcer of  other part of right lower leg with unspecified severity 02/13/2021 02/13/2021 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 with unspecified severity 02/13/2021 02/13/2021 Resolved Problems Electronic Signature(s) Signed: 04/21/2021 10:09:17 AM By: Geralyn Corwin DO Entered By: Geralyn Corwin on 04/21/2021 09:56:56 -------------------------------------------------------------------------------- Progress Note Details Patient Name: Date of Service: Kelly Milling [name] 04/21/2021 8:30 A M Medical Record Number: [id number] Patient Account Number: [id number] Date of Birth/Sex: Treating RN: 1995-11-25 (25 y.o. Tommye Standard Primary Care Provider: Dione Booze Other Clinician: Referring Provider: Treating Provider/Extender: Gennaro Africa in Treatment: 9 Subjective Chief Complaint Information obtained from Patient Bilateral lower extremity wounds 04/21/21: left lower extremity wound s/p cellulitis History of Present Illness (HPI) Admission 6/21 Ms. Neriah Brott is A 25 year old female with a past medical history of systemic lupus erythematosus that presents with bilateral lower extremity wounds that started in April 2022. She has had skin issues in the past where she developed very small wounds but these healed with time. She has never had open wounds like she does on her legs. She currently reports minimal drainage to the wounds. She does have pain to these areas but has been overall stable. She denies signs of infection. She has visited the ED for this issue and was recently prescribed doxycycline for possible cellulitis. She follows with [facility] for her SLE and is currently on Plaquenil, prednisone and CellCept. 6/27; patient presents for 1 week follow-up. She states she saw her rheumatologist on 6/22 and prednisone was increased from 10 mg to 20 mg daily. She is currently at the highest dose of Plaquenil and CellCept and  is going to try an infusion later today at her regimen. She reports improvement to the wounds. She has been using collagen with dressing changes. She currently denies signs of infection. Readmission 8/26 Patient presents to clinic today for open wounds to her left lower extremity. Her previous wounds that were treated have closed. She states that on 8/9 she developed cellulitis and was treated with 2 rounds of antibiotics. Her cellulitis has resolved. She had blisters that opened and are now large wounds. She has  seen her dermatologist and rheumatologist since she has systemic lupus and a hard time healing. No changes have been made to her medications. She was recommended antibiotic ointment by her dermatologist to her wounds She picked up the antibiotic ointment today.. She reports pain to these areas. She denies purulent drainage, increased warmth or erythema to the left lower extremity. She currently keeps the areas covered. Of note she had an Achilles tendon rupture on her right leg and is currently in a soft cast. Patient History Information obtained from Patient. Family History Diabetes - Paternal Grandparents, Hypertension - Paternal Grandparents, Thyroid Problems - Maternal Grandparents,Paternal Grandparents, No family history of Cancer, Heart Disease, Hereditary Spherocytosis, Kidney Disease, Lung Disease, Seizures, Stroke, Tuberculosis. Social History Never smoker, Marital Status - Married, Alcohol Use - Moderate, Drug Use - Current History - Marijuana, Caffeine Use - Daily - Coffee. Medical History Respiratory Patient has history of Asthma Immunological Patient has history of Lupus Erythematosus Objective Constitutional respirations regular, non-labored and within target range for patient.. Vitals Time Taken: 8:41 AM, Height: 62 in, Temperature: 98.1 F, Pulse: 117 bpm, Respiratory Rate: 16 breaths/min, Blood Pressure: 168/98 mmHg. Psychiatric pleasant and cooperative. General  Notes: Left lower extremity: 4 wounds present. 3 are eschared and 1 is open with granulation tissue and slough. No obvious signs of infection. Integumentary (Hair, Skin) Wound #1 status is Open. Original cause of wound was Other Lesion. The date acquired was: 01/17/2021. The wound has been in treatment 9 weeks. The wound is located on the Left,Medial Lower Leg. The wound measures 0.1cm length x 0.1cm width x 0.1cm depth; 0.008cm^2 area and 0.001cm^3 volume. There is Fat Layer (Subcutaneous Tissue) exposed. There is a small amount of serosanguineous drainage noted. The wound margin is well defined and not attached to the wound base. There is large (67-100%) red granulation within the wound bed. There is a small (1-33%) amount of necrotic tissue within the wound bed including Eschar. Wound #2 status is Healed - Epithelialized. Original cause of wound was Other Lesion. The date acquired was: 01/23/2021. The wound has been in treatment 9 weeks. The wound is located on the Right,Medial Lower Leg. The wound measures 0cm length x 0cm width x 0cm depth; 0cm^2 area and 0cm^3 volume. There is a small amount of purulent drainage noted. Wound #3 status is Open. Original cause of wound was Gradually Appeared. The date acquired was: 04/14/2021. The wound is located on the Left,Posterior Lower Leg. The wound measures 1.3cm length x 1.2cm width x 1.2cm depth; 1.225cm^2 area and 1.47cm^3 volume. There is Fat Layer (Subcutaneous Tissue) exposed. There is no tunneling noted, however, there is undermining starting at 12:00 and ending at 12:00 with a maximum distance of 1.2cm. There is a medium amount of serosanguineous drainage noted. The wound margin is distinct with the outline attached to the wound base. There is medium (34-66%) red, pink granulation within the wound bed. There is a medium (34-66%) amount of necrotic tissue within the wound bed including Eschar and Adherent Slough. Wound #4 status is Open. Original cause  of wound was Gradually Appeared. The date acquired was: 04/14/2021. The wound is located on the Left,Lateral Ankle. The wound measures 4cm length x 5cm width x 0.4cm depth; 15.708cm^2 area and 6.283cm^3 volume. There is no tunneling or undermining noted. There is a medium amount of serosanguineous drainage noted. The wound margin is distinct with the outline attached to the wound base. There is no granulation within the wound bed. There is a large (67-100%)  amount of necrotic tissue within the wound bed including Eschar and Adherent Slough. Wound #5 status is Open. Original cause of wound was Gradually Appeared. The date acquired was: 04/14/2021. The wound is located on the Left,Anterior Ankle. The wound measures 1cm length x 1.2cm width x 0.2cm depth; 0.942cm^2 area and 0.188cm^3 volume. There is no tunneling or undermining noted. There is a medium amount of serosanguineous drainage noted. The wound margin is distinct with the outline attached to the wound base. There is no granulation within the wound bed. There is a large (67-100%) amount of necrotic tissue within the wound bed including Eschar and Adherent Slough. Wound #6 status is Open. Original cause of wound was Gradually Appeared. The date acquired was: 04/14/2021. The wound is located on the Left,Proximal Lower Leg. The wound measures 1cm length x 0.8cm width x 0.2cm depth; 0.628cm^2 area and 0.126cm^3 volume. There is no tunneling or undermining noted. There is a medium amount of serosanguineous drainage noted. The wound margin is distinct with the outline attached to the wound base. There is no granulation within the wound bed. There is a large (67-100%) amount of necrotic tissue within the wound bed including Eschar. Assessment Active Problems ICD-10 Unspecified open wound, left lower leg, initial encounter Unspecified open wound, left ankle, initial encounter Systemic lupus erythematosus, unspecified Patient presents for 4 new wounds.  Her previous wounds have healed. This was due to an increase in prednisone by her rheumatologist several months ago. She is still taking 20 mg of prednisone currently. Unfortunately patient now has 4 new wounds. She developed cellulitis with blisters and now the blisters have opened and she has had poor wound healing. She has seen her rheumatologist since all of this is occurred and they recommended infusions. It does appear that her lupus is not under control and she may benefit from this. In the meantime I recommended using Santyl as she has eschar/slough to 3 of the wounds. She has some slough and granulation to the fourth wound. There are no signs of infection on exam. After we are able to remove the necrotic debris I recommended collagen. I would also like to assess blood flow to rule out any other barriers to her wound healing. 47 minutes was spent on the encounter including face-to-face, EMR review and coordination of care Plan Follow-up Appointments: Return Appointment in 1 week. Other: - [place]=Supplies Bathing/ Shower/ Hygiene: May shower and wash wound with soap and water. Edema Control - Lymphedema / SCD / Other: Avoid standing for long periods of time. Other Edema Control Orders/Instructions: - Elevate legs if swelling occurs Additional Orders / Instructions: Follow Nutritious Diet Services and Therapies ordered were: Arterial Studies- Unilateral left lower leg with ABI and TBI - necrotic ulcers left lower leg, systemic lupus The following medication(s) was prescribed: Santyl topical 250 unit/gram ointment ointment topical apply nickle thick to left leg wounds daily starting 04/21/2021 1. Crosshatched to eschared areas 2. Santyl daily to all wound beds 3. ABIs with TBI's 4. Follow-up in 1 week Electronic Signature(s) Signed: 04/21/2021 10:13:57 AM By: Geralyn Corwin DO Entered By: Geralyn Corwin on 04/21/2021  10:08:28 -------------------------------------------------------------------------------- HxROS Details Patient Name: Date of Service: Kelly Milling [name] 04/21/2021 8:30 A M Medical Record Number: [id number] Patient Account Number: [id number] Date of Birth/Sex: Treating RN: 19-Aug-1996 (25 y.o. Tommye Standard Primary Care Provider: Dione Booze Other Clinician: Referring Provider: Treating Provider/Extender: Gennaro Africa in Treatment: 9 Information Obtained From Patient Respiratory Medical History: Positive for: Asthma  Immunological Medical History: Positive for: Lupus Erythematosus Immunizations Pneumococcal Vaccine: Received Pneumococcal Vaccination: No Implantable Devices None Family and Social History Cancer: No; Diabetes: Yes - Paternal Grandparents; Heart Disease: No; Hereditary Spherocytosis: No; Hypertension: Yes - Paternal Grandparents; Kidney Disease: No; Lung Disease: No; Seizures: No; Stroke: No; Thyroid Problems: Yes - Maternal Grandparents,Paternal Grandparents; Tuberculosis: No; Never smoker; Marital Status - Married; Alcohol Use: Moderate; Drug Use: Current History - Marijuana; Caffeine Use: Daily - Coffee; Financial Concerns: No; Food, Clothing or Shelter Needs: No; Support System Lacking: No Electronic Signature(s) Signed: 04/21/2021 10:09:17 AM By: Geralyn Corwin DO Signed: 04/21/2021 1:08:00 PM By: Zenaida Deed RN, BSN Entered By: Geralyn Corwin on 04/21/2021 10:04:29 -------------------------------------------------------------------------------- SuperBill Details Patient Name: Date of Service: Kelly Milling [name] 04/21/2021 Medical Record Number: [id number] Patient Account Number: [id number] Date of Birth/Sex: Treating RN: 1996-04-15 (25 y.o. Tommye Standard Primary Care Provider: Dione Booze Other Clinician: Referring Provider: Treating Provider/Extender: Gennaro Africa in  Treatment: 9 Diagnosis Coding ICD-10 Codes Code Description [phone] Unspecified open wound, left lower leg, initial encounter S91.002A Unspecified open wound, left ankle, initial encounter M32.9 Systemic lupus erythematosus, unspecified Facility Procedures CPT4 Code: [id number] Description: 62703 - DEBRIDE W/O ANES NON SELECT Modifier: Quantity: 1 Physician Procedures Electronic Signature(s) Signed: 04/21/2021 10:23:21 AM By: Geralyn Corwin DO Signed: 04/21/2021 1:08:00 PM By: Zenaida Deed RN, BSN Previous Signature: 04/21/2021 10:09:17 AM Version By: Geralyn Corwin DO Entered By: Zenaida Deed on 04/21/2021 10:20:18

## 2021-04-24 DIAGNOSIS — S86011A Strain of right Achilles tendon, initial encounter: Secondary | ICD-10-CM | POA: Diagnosis not present

## 2021-04-26 ENCOUNTER — Other Ambulatory Visit (HOSPITAL_COMMUNITY): Payer: Self-pay | Admitting: Internal Medicine

## 2021-04-26 DIAGNOSIS — L97929 Non-pressure chronic ulcer of unspecified part of left lower leg with unspecified severity: Secondary | ICD-10-CM

## 2021-04-27 ENCOUNTER — Other Ambulatory Visit: Payer: Self-pay

## 2021-04-27 ENCOUNTER — Encounter (HOSPITAL_BASED_OUTPATIENT_CLINIC_OR_DEPARTMENT_OTHER): Payer: BC Managed Care – PPO | Attending: Internal Medicine | Admitting: Internal Medicine

## 2021-04-27 ENCOUNTER — Ambulatory Visit (HOSPITAL_COMMUNITY)
Admission: RE | Admit: 2021-04-27 | Discharge: 2021-04-27 | Disposition: A | Discharge status: Home / Self Care | Payer: BC Managed Care – PPO | Source: Ambulatory Visit | Attending: Internal Medicine | Admitting: Internal Medicine

## 2021-04-27 DIAGNOSIS — L97929 Non-pressure chronic ulcer of unspecified part of left lower leg with unspecified severity: Secondary | ICD-10-CM

## 2021-04-27 NOTE — Progress Notes (Signed)
Kelly [name], Kelly [name] ([id number]) Visit Report for 04/21/2021 Arrival Information Details Patient Name: Date of Service: Kelly [name], Kelly [name]. 04/21/2021 8:30 A M Medical Record Number: [id number] Patient Account Number: [id number] Date of Birth/Sex: Treating RN: [birth year] (25 y.o. [name], [name] Primary Care Donnalyn Juran: Dione Booze Other Clinician: Referring Jamara Vary: Treating Shuntavia Yerby/Extender: Gennaro Africa in Treatment: 9 Visit Information History Since Last Visit Added or deleted any medications: No Patient Arrived: Crutches Any new allergies or adverse reactions: No Arrival Time: 08:38 Had a fall or experienced change in Yes Accompanied By: self activities of daily living that may affect Transfer Assistance: None risk of falls: Patient Identification Verified: Yes Signs or symptoms of abuse/neglect since last visito No Secondary Verification Process Completed: Yes Hospitalized since last visit: No Patient Requires Transmission-Based Precautions: No Implantable device outside of the clinic excluding No Patient Has Alerts: No cellular tissue based products placed in the center since last visit: Has Dressing in Place as Prescribed: Yes Pain Present Now: Yes Notes Patient stated she had a fall Saturday from dancing and went to primary care and they sent her to emergency ortho and they did an x ray and her torn achilleas in the right leg, Patient has a soft cast on. Electronic Signature(s) Signed: 04/24/2021 3:13:54 PM By: Karl Ito Entered By: Karl Ito on 04/21/2021 08:41:52 -------------------------------------------------------------------------------- Encounter Discharge Information Details Patient Name: Date of Service: Kelly Milling [name] 04/21/2021 8:30 A M Medical Record Number: [id number] Patient Account Number: [id number] Date of Birth/Sex: Treating RN: [birth year] (25 y.o. [name] Primary Care Blayze Haen:  Dione Booze Other Clinician: Referring Yoselyn Mcglade: Treating Laterria Lasota/Extender: Gennaro Africa in Treatment: 9 Encounter Discharge Information Items Post Procedure Vitals Discharge Condition: Stable Temperature (F): 98.1 Ambulatory Status: Ambulatory Pulse (bpm): 117 Discharge Destination: Home Respiratory Rate (breaths/min): 16 Transportation: Private Auto Blood Pressure (mmHg): 168/98 Accompanied By: spouse Schedule Follow-up Appointment: Yes Clinical Summary of Care: Patient Declined Electronic Signature(s) Signed: 04/27/2021 5:19:32 PM By: Zandra Abts RN, BSN Entered By: Zandra Abts on 04/21/2021 10:16:36 -------------------------------------------------------------------------------- Lower Extremity Assessment Details Patient Name: Date of Service: Kelly Milling [name] 04/21/2021 8:30 A M Medical Record Number: [id number] Patient Account Number: [id number] Date of Birth/Sex: Treating RN: Sep 08, 1995 (25 y.o. [name], [name] Primary Care Kaveh Kissinger: Dione Booze Other Clinician: Referring Kendell Sagraves: Treating Basir Niven/Extender: Gennaro Africa in Treatment: 9 Edema Assessment Assessed: Kyra Searles: Yes] Franne Forts: No] Edema: [Left: Ye] [Right: s] Calf Left: Right: Point of Measurement: 27 cm From Medial Instep 33.5 cm Ankle Left: Right: Point of Measurement: 8 cm From Medial Instep 23.6 cm Vascular Assessment Pulses: Dorsalis Pedis Palpable: [Left:Yes] Posterior Tibial Palpable: [Left:Yes] Electronic Signature(s) Signed: 04/25/2021 5:24:19 PM By: Fonnie Mu RN Entered By: Fonnie Mu on 04/21/2021 08:53:06 -------------------------------------------------------------------------------- Multi Wound Chart Details Patient Name: Date of Service: Kelly Milling [name] 04/21/2021 8:30 A M Medical Record Number: [id number] Patient Account Number: [id number] Date of Birth/Sex: Treating RN: [birth year]  (25 y.o. Tommye Standard Primary Care Kalisa Girtman: Dione Booze Other Clinician: Referring Jaquilla Woodroof: Treating Taimur Fier/Extender: Gennaro Africa in Treatment: 9 Vital Signs Height(in): 62 Pulse(bpm): 117 Weight(lbs): Blood Pressure(mmHg): 168/98 Body Mass Index(BMI): Temperature(F): 98.1 Respiratory Rate(breaths/min): 16 Photos: [1:Left, Medial Lower Leg] [2:No Photos Right, Medial Lower Leg] [3:Left, Posterior Lower Leg] Wound Location: [1:Other Lesion] [2:Other Lesion] [3:Gradually Appeared] Wounding Event: [1:Lupus] [2:Lupus] [3:Cellulitis] Primary Etiology: [1:N/A] [2:N/A] [3:N/A] Secondary Etiology: [1:Asthma, Lupus Erythematosus] [2:N/A] [3:Asthma, Lupus Erythematosus] Comorbid History: [1:01/17/2021] [2:01/23/2021] [3:04/14/2021]  Date Acquired: [1:9] [2:9] [3:0] Weeks of Treatment: [1:Open] [2:Healed - Epithelialized] [3:Open] Wound Status: [1:0.1x0.1x0.1] [2:0x0x0] [3:1.3x1.2x1.2] Measurements L x W x D (cm) [1:0.008] [2:0] [3:1.225] A (cm) : rea [1:0.001] [2:0] [3:1.47] Volume (cm) : [1:100.00%] [2:100.00%] [3:0.00%] % Reduction in A rea: [1:100.00%] [2:100.00%] [3:0.00%] % Reduction in Volume: [3:12] Starting Position 1 (o'clock): [3:12] Ending Position 1 (o'clock): [3:1.2] Maximum Distance 1 (cm): [1:N/A] [2:N/A] [3:Yes] Undermining: [1:Full Thickness Without Exposed] [2:Full Thickness Without Exposed] [3:Full Thickness With Exposed Support] Classification: [1:Support Structures Small] [2:Support Structures Small] [3:Structures Medium] Exudate A mount: [1:Serosanguineous] [2:Purulent] [3:Serosanguineous] Exudate Type: [1:red, brown] [2:yellow, brown, green] [3:red, brown] Exudate Color: [1:Well defined, not attached] [2:N/A] [3:Distinct, outline attached] Wound Margin: [1:Large (67-100%)] [2:N/A] [3:Medium (34-66%)] Granulation Amount: [1:Red] [2:N/A] [3:Red, Pink] Granulation Quality: [1:Small (1-33%)] [2:N/A] [3:Medium  (34-66%)] Necrotic Amount: [1:Eschar] [2:N/A] [3:Eschar, Adherent Slough] Necrotic Tissue: [1:Fat Layer (Subcutaneous Tissue): Yes N/A] [3:Fat Layer (Subcutaneous Tissue): Yes] Exposed Structures: [1:Fascia: No Tendon: No Muscle: No Joint: No Bone: No None] [2:N/A] [3:Fascia: No Tendon: No Muscle: No Joint: No Bone: No N/A] Wound Number: 4 5 6  Photos: Left, Lateral Ankle Left, Anterior Ankle Left, Proximal Lower Leg Wound Location: Gradually Appeared Gradually Appeared Gradually Appeared Wounding Event: Cellulitis Cellulitis Lupus Primary Etiology: Lupus Lupus N/A Secondary Etiology: Asthma, Lupus Erythematosus Asthma, Lupus Erythematosus Asthma, Lupus Erythematosus Comorbid History: 04/14/2021 04/14/2021 04/14/2021 Date Acquired: 0 0 0 Weeks of Treatment: Open Open Open Wound Status: 4x5x0.4 1x1.2x0.2 1x0.8x0.2 Measurements L x W x D (cm) 15.708 0.942 0.628 A (cm) : rea 6.283 0.188 0.126 Volume (cm) : 0.00% 0.00% 0.00% % Reduction in A rea: 0.00% 0.00% 0.00% % Reduction in Volume: No No No Undermining: Full Thickness Without Exposed Full Thickness Without Exposed Full Thickness Without Exposed Classification: Support Structures Support Structures Support Structures Medium Medium Medium Exudate A mount: Serosanguineous Serosanguineous Serosanguineous Exudate Type: red, brown red, brown red, brown Exudate Color: Distinct, outline attached Distinct, outline attached Distinct, outline attached Wound Margin: None Present (0%) None Present (0%) None Present (0%) Granulation Amount: N/A N/A N/A Granulation Quality: Large (67-100%) Large (67-100%) Large (67-100%) Necrotic Amount: Eschar, Adherent Slough Eschar, Adherent Slough Eschar Necrotic Tissue: Fascia: No Fascia: No Fascia: No Exposed Structures: Fat Layer (Subcutaneous Tissue): No Fat Layer (Subcutaneous Tissue): No Fat Layer (Subcutaneous Tissue): No Tendon: No Tendon: No Tendon: No Muscle:  No Muscle: No Muscle: No Joint: No Joint: No Joint: No Bone: No Bone: No Bone: No None None None Epithelialization: Treatment Notes Electronic Signature(s) Signed: 04/21/2021 10:09:17 AM By: 04/23/2021 DO Signed: 04/21/2021 1:08:00 PM By: 04/23/2021 RN, BSN Entered By: Zenaida Deed on 04/21/2021 09:57:08 -------------------------------------------------------------------------------- Multi-Disciplinary Care Plan Details Patient Name: Date of Service: 04/23/2021 [name] 04/21/2021 8:30 A M Medical Record Number: 04/23/2021 Patient Account Number: [id number] Date of Birth/Sex: Treating RN: 1996-05-29 (25 y.o. 03/19/1996 Primary Care Arissa Fagin: Tommye Standard Other Clinician: Referring Alejandrina Raimer: Treating Jobani Sabado/Extender: Dione Booze in Treatment: 9 Multidisciplinary Care Plan reviewed with physician Active Inactive Abuse / Safety / Falls / Self Care Management Nursing Diagnoses: History of Falls Potential for falls Goals: Patient/caregiver will verbalize/demonstrate measures taken to prevent injury and/or falls Date Initiated: 04/21/2021 Target Resolution Date: 05/19/2021 Goal Status: Active Interventions: Assess fall risk on admission and as needed Assess impairment of mobility on admission and as needed per policy Notes: Wound/Skin Impairment Nursing Diagnoses: Impaired tissue integrity Goals: Patient/caregiver will verbalize understanding of skin care regimen Date Initiated: 02/13/2021 Target Resolution Date: 05/19/2021 Goal  Status: Active Ulcer/skin breakdown will have a volume reduction of 30% by week 4 Date Initiated: 02/13/2021 Date Inactivated: 04/21/2021 Target Resolution Date: 03/13/2021 Unmet Reason: pt did not return to Goal Status: Unmet clinic for F/U Ulcer/skin breakdown will have a volume reduction of 80% by week 12 Date Initiated: 04/21/2021 Target Resolution Date: 05/19/2021 Goal Status:  Active Interventions: Assess patient/caregiver ability to obtain necessary supplies Assess patient/caregiver ability to perform ulcer/skin care regimen upon admission and as needed Assess ulceration(s) every visit Provide education on ulcer and skin care Treatment Activities: Skin care regimen initiated : 02/13/2021 Topical wound management initiated : 02/13/2021 Notes: Electronic Signature(s) Signed: 04/21/2021 1:08:00 PM By: Zenaida Deed RN, BSN Entered By: Zenaida Deed on 04/21/2021 08:49:04 -------------------------------------------------------------------------------- Pain Assessment Details Patient Name: Date of Service: Kelly Milling [name] 04/21/2021 8:30 A M Medical Record Number: [id number] Patient Account Number: [id number] Date of Birth/Sex: Treating RN: 1995-10-29 (25 y.o. Tommye Standard Primary Care King Pinzon: Dione Booze Other Clinician: Referring Alpha Mysliwiec: Treating Ludell Zacarias/Extender: Gennaro Africa in Treatment: 9 Active Problems Location of Pain Severity and Description of Pain Patient Has Paino Yes Site Locations Rate the pain. Current Pain Level: 8 Pain Management and Medication Current Pain Management: Electronic Signature(s) Signed: 04/21/2021 1:08:00 PM By: Zenaida Deed RN, BSN Signed: 04/24/2021 3:13:54 PM By: Karl Ito Entered By: Karl Ito on 04/21/2021 08:42:36 -------------------------------------------------------------------------------- Patient/Caregiver Education Details Patient Name: Date of Service: Kelly [name] 8/26/2022andnbsp8:30 A M Medical Record Number: [id number] Patient Account Number: [id number] Date of Birth/Gender: Treating RN: Oct 03, 1995 (25 y.o. Tommye Standard Primary Care Physician: Dione Booze Other Clinician: Referring Physician: Treating Physician/Extender: Gennaro Africa in Treatment: 9 Education Assessment Education  Provided To: Patient Education Topics Provided Wound/Skin Impairment: Methods: Explain/Verbal Responses: Reinforcements needed, State content correctly [employer]) Signed: 04/21/2021 1:08:00 PM By: Zenaida Deed RN, BSN Entered By: Zenaida Deed on 04/21/2021 08:51:26 -------------------------------------------------------------------------------- Wound Assessment Details Patient Name: Date of Service: Kelly Milling RIA Cherly Anderson. 04/21/2021 8:30 A M Medical Record Number: [id number] Patient Account Number: [id number] Date of Birth/Sex: Treating RN: 28-Jan-1996 (25 y.o. [name], [name] Primary Care Laylee Schooley: Dione Booze Other Clinician: Referring Azelyn Batie: Treating Raegan Sipp/Extender: Gennaro Africa in Treatment: 9 Wound Status Wound Number: 1 Primary Etiology: Lupus Wound Location: Left, Medial Lower Leg Wound Status: Open Wounding Event: Other Lesion Comorbid History: Asthma, Lupus Erythematosus Date Acquired: 01/17/2021 Weeks Of Treatment: 9 Clustered Wound: No Photos Wound Measurements Length: (cm) 0.1 Width: (cm) 0.1 Depth: (cm) 0.1 Area: (cm) 0.008 Volume: (cm) 0.001 % Reduction in Area: 100% % Reduction in Volume: 100% Epithelialization: None Wound Description Classification: Full Thickness Without Exposed Support Structures Wound Margin: Well defined, not attached Exudate Amount: Small Exudate Type: Serosanguineous Exudate Color: red, brown Foul Odor After Cleansing: No Slough/Fibrino Yes Wound Bed Granulation Amount: Large (67-100%) Exposed Structure Granulation Quality: Red Fascia Exposed: No Necrotic Amount: Small (1-33%) Fat Layer (Subcutaneous Tissue) Exposed: Yes Necrotic Quality: Eschar Tendon Exposed: No Muscle Exposed: No Joint Exposed: No Bone Exposed: No Treatment Notes Wound #1 (Lower Leg) Wound Laterality: Left, Medial Cleanser Peri-Wound Care Topical Primary Dressing Santyl  Ointment Discharge Instruction: Apply nickel thick amount to wound bed as instructed Skintegrity Hydrogel, 4 (oz) Discharge Instruction: apply thin layer over santyl Secondary Dressing Woven Gauze Sponge, Non-Sterile 4x4 in Discharge Instruction: Apply over primary dressing as directed. Secured With [employer], 4.5x3.1 (in/yd) Discharge Instruction: Secure with Kerlix as directed. Paper Tape, 2x10 (in/yd) Discharge Instruction: Secure  dressing with tape as directed. Compression Wrap Compression Stockings Add-Ons Electronic Signature(s) Signed: 04/25/2021 5:24:19 PM By: Fonnie Mu RN Entered By: Fonnie Mu on 04/21/2021 09:03:26 -------------------------------------------------------------------------------- Wound Assessment Details Patient Name: Date of Service: Kelly Milling RIA Cherly Anderson. 04/21/2021 8:30 A M Medical Record Number: [id number] Patient Account Number: [id number] Date of Birth/Sex: Treating RN: 07-27-1996 (25 y.o. [name], [name] Primary Care Brittley Regner: Dione Booze Other Clinician: Referring Antiono Ettinger: Treating Deetya Drouillard/Extender: Gennaro Africa in Treatment: 9 Wound Status Wound Number: 2 Primary Etiology: Lupus Wound Location: Right, Medial Lower Leg Wound Status: Healed - Epithelialized Wounding Event: Other Lesion Date Acquired: 01/23/2021 Weeks Of Treatment: 9 Clustered Wound: No Wound Measurements Length: (cm) Width: (cm) Depth: (cm) Area: (cm) Volume: (cm) 0 % Reduction in Area: 100% 0 % Reduction in Volume: 100% 0 0 0 Wound Description Classification: Full Thickness Without Exposed Support Structu Exudate Amount: Small Exudate Type: Purulent Exudate Color: yellow, brown, green res Treatment Notes Wound #2 (Lower Leg) Wound Laterality: Right, Medial Cleanser Peri-Wound Care Topical Primary Dressing Secondary Dressing Secured With Compression Wrap Compression  Stockings Add-Ons Electronic Signature(s) Signed: 04/25/2021 5:24:19 PM By: Fonnie Mu RN Entered By: Fonnie Mu on 04/21/2021 09:03:13 -------------------------------------------------------------------------------- Wound Assessment Details Patient Name: Date of Service: Kelly Milling [name] 04/21/2021 8:30 A M Medical Record Number: [id number] Patient Account Number: [id number] Date of Birth/Sex: Treating RN: 21-Nov-1995 (25 y.o. [name], [name] Primary Care Cadance Raus: Dione Booze Other Clinician: Referring Lani Havlik: Treating Kindel Rochefort/Extender: Gennaro Africa in Treatment: 9 Wound Status Wound Number: 3 Primary Etiology: Cellulitis Wound Location: Left, Posterior Lower Leg Wound Status: Open Wounding Event: Gradually Appeared Comorbid History: Asthma, Lupus Erythematosus Date Acquired: 04/14/2021 Weeks Of Treatment: 0 Clustered Wound: No Photos Wound Measurements Length: (cm) 1.3 Width: (cm) 1.2 Depth: (cm) 1.2 Area: (cm) 1.225 Volume: (cm) 1.47 % Reduction in Area: 0% % Reduction in Volume: 0% Tunneling: No Undermining: Yes Starting Position (o'clock): 12 Ending Position (o'clock): 12 Maximum Distance: (cm) 1.2 Wound Description Classification: Full Thickness With Exposed Support Structures Wound Margin: Distinct, outline attached Exudate Amount: Medium Exudate Type: Serosanguineous Exudate Color: red, brown Foul Odor After Cleansing: No Slough/Fibrino Yes Wound Bed Granulation Amount: Medium (34-66%) Exposed Structure Granulation Quality: Red, Pink Fascia Exposed: No Necrotic Amount: Medium (34-66%) Fat Layer (Subcutaneous Tissue) Exposed: Yes Necrotic Quality: Eschar, Adherent Slough Tendon Exposed: No Muscle Exposed: No Joint Exposed: No Bone Exposed: No Treatment Notes Wound #3 (Lower Leg) Wound Laterality: Left, Posterior Cleanser Peri-Wound Care Topical Primary Dressing Santyl  Ointment Discharge Instruction: Apply nickel thick amount to wound bed as instructed Skintegrity Hydrogel, 4 (oz) Discharge Instruction: apply thin layer over santyl Secondary Dressing Woven Gauze Sponge, Non-Sterile 4x4 in Discharge Instruction: Apply over primary dressing as directed. Secured With [employer], 4.5x3.1 (in/yd) Discharge Instruction: Secure with Kerlix as directed. Paper Tape, 2x10 (in/yd) Discharge Instruction: Secure dressing with tape as directed. Compression Wrap Compression Stockings Add-Ons Electronic Signature(s) Signed: 04/24/2021 3:13:54 PM By: Karl Ito Signed: 04/25/2021 5:24:19 PM By: Fonnie Mu RN Entered By: Karl Ito on 04/21/2021 09:12:44 -------------------------------------------------------------------------------- Wound Assessment Details Patient Name: Date of Service: Kelly Milling [name] 04/21/2021 8:30 A M Medical Record Number: [id number] Patient Account Number: [id number] Date of Birth/Sex: Treating RN: November 16, 1995 (25 y.o. [name], [name] Primary Care Javiel Canepa: Dione Booze Other Clinician: Referring Deanna Boehlke: Treating Doil Kamara/Extender: Gennaro Africa in Treatment: 9 Wound Status Wound Number: 4 Primary Etiology: Cellulitis Wound Location: Left, Lateral Ankle Secondary Etiology: Lupus  Wounding Event: Gradually Appeared Wound Status: Open Date Acquired: 04/14/2021 Comorbid History: Asthma, Lupus Erythematosus Weeks Of Treatment: 0 Clustered Wound: No Photos Wound Measurements Length: (cm) 4 Width: (cm) 5 Depth: (cm) 0.4 Area: (cm) 15.708 Volume: (cm) 6.283 % Reduction in Area: 0% % Reduction in Volume: 0% Epithelialization: None Tunneling: No Undermining: No Wound Description Classification: Full Thickness Without Exposed Support Structu Wound Margin: Distinct, outline attached Exudate Amount: Medium Exudate Type: Serosanguineous Exudate Color: red,  brown res Foul Odor After Cleansing: No Slough/Fibrino Yes Wound Bed Granulation Amount: None Present (0%) Exposed Structure Necrotic Amount: Large (67-100%) Fascia Exposed: No Necrotic Quality: Eschar, Adherent Slough Fat Layer (Subcutaneous Tissue) Exposed: No Tendon Exposed: No Muscle Exposed: No Joint Exposed: No Bone Exposed: No Treatment Notes Wound #4 (Ankle) Wound Laterality: Left, Lateral Cleanser Peri-Wound Care Topical Primary Dressing Santyl Ointment Discharge Instruction: Apply nickel thick amount to wound bed as instructed Skintegrity Hydrogel, 4 (oz) Discharge Instruction: apply thin layer over santyl Secondary Dressing Woven Gauze Sponge, Non-Sterile 4x4 in Discharge Instruction: Apply over primary dressing as directed. Secured With [employer], 4.5x3.1 (in/yd) Discharge Instruction: Secure with Kerlix as directed. Paper Tape, 2x10 (in/yd) Discharge Instruction: Secure dressing with tape as directed. Compression Wrap Compression Stockings Add-Ons Electronic Signature(s) Signed: 04/24/2021 3:13:54 PM By: Karl Ito Signed: 04/25/2021 5:24:19 PM By: Fonnie Mu RN Entered By: Karl Ito on 04/21/2021 09:13:24 -------------------------------------------------------------------------------- Wound Assessment Details Patient Name: Date of Service: Kelly Milling [name] 04/21/2021 8:30 A M Medical Record Number: [id number] Patient Account Number: [id number] Date of Birth/Sex: Treating RN: 20-Jun-1996 (25 y.o. [name], [name] Primary Care Russell Engelstad: Dione Booze Other Clinician: Referring Margie Brink: Treating Fergus Throne/Extender: Gennaro Africa in Treatment: 9 Wound Status Wound Number: 5 Primary Etiology: Cellulitis Wound Location: Left, Anterior Ankle Secondary Etiology: Lupus Wounding Event: Gradually Appeared Wound Status: Open Date Acquired: 04/14/2021 Comorbid History: Asthma, Lupus  Erythematosus Weeks Of Treatment: 0 Clustered Wound: No Photos Wound Measurements Length: (cm) 1 Width: (cm) 1.2 Depth: (cm) 0.2 Area: (cm) 0.942 Volume: (cm) 0.188 % Reduction in Area: 0% % Reduction in Volume: 0% Epithelialization: None Tunneling: No Undermining: No Wound Description Classification: Full Thickness Without Exposed Support Structures Wound Margin: Distinct, outline attached Exudate Amount: Medium Exudate Type: Serosanguineous Exudate Color: red, brown Foul Odor After Cleansing: No Slough/Fibrino Yes Wound Bed Granulation Amount: None Present (0%) Exposed Structure Necrotic Amount: Large (67-100%) Fascia Exposed: No Necrotic Quality: Eschar, Adherent Slough Fat Layer (Subcutaneous Tissue) Exposed: No Tendon Exposed: No Muscle Exposed: No Joint Exposed: No Bone Exposed: No Treatment Notes Wound #5 (Ankle) Wound Laterality: Left, Anterior Cleanser Peri-Wound Care Topical Primary Dressing Santyl Ointment Discharge Instruction: Apply nickel thick amount to wound bed as instructed Skintegrity Hydrogel, 4 (oz) Discharge Instruction: apply thin layer over santyl Secondary Dressing Woven Gauze Sponge, Non-Sterile 4x4 in Discharge Instruction: Apply over primary dressing as directed. Secured With [employer], 4.5x3.1 (in/yd) Discharge Instruction: Secure with Kerlix as directed. Paper Tape, 2x10 (in/yd) Discharge Instruction: Secure dressing with tape as directed. Compression Wrap Compression Stockings Add-Ons Electronic Signature(s) Signed: 04/24/2021 3:13:54 PM By: Karl Ito Signed: 04/25/2021 5:24:19 PM By: Fonnie Mu RN Entered By: Karl Ito on 04/21/2021 09:13:51 -------------------------------------------------------------------------------- Wound Assessment Details Patient Name: Date of Service: Kelly Milling [name] 04/21/2021 8:30 A M Medical Record Number: [id number] Patient Account Number: [id number] Date of  Birth/Sex: Treating RN: February 10, 1996 (25 y.o. [name], [name] Primary Care Nya Monds: Dione Booze Other Clinician: Referring Esau Fridman: Treating Rain Wilhide/Extender: Ledell Peoples  Weeks in Treatment: 9 Wound Status Wound Number: 6 Primary Etiology: Lupus Wound Location: Left, Proximal Lower Leg Wound Status: Open Wounding Event: Gradually Appeared Comorbid History: Asthma, Lupus Erythematosus Date Acquired: 04/14/2021 Weeks Of Treatment: 0 Clustered Wound: No Photos Wound Measurements Length: (cm) 1 Width: (cm) 0.8 Depth: (cm) 0.2 Area: (cm) 0.628 Volume: (cm) 0.126 % Reduction in Area: 0% % Reduction in Volume: 0% Epithelialization: None Tunneling: No Undermining: No Wound Description Classification: Full Thickness Without Exposed Support Structures Wound Margin: Distinct, outline attached Exudate Amount: Medium Exudate Type: Serosanguineous Exudate Color: red, brown Foul Odor After Cleansing: No Slough/Fibrino Yes Wound Bed Granulation Amount: None Present (0%) Exposed Structure Necrotic Amount: Large (67-100%) Fascia Exposed: No Necrotic Quality: Eschar Fat Layer (Subcutaneous Tissue) Exposed: No Tendon Exposed: No Muscle Exposed: No Joint Exposed: No Bone Exposed: No Treatment Notes Wound #6 (Lower Leg) Wound Laterality: Left, Proximal Cleanser Peri-Wound Care Topical Primary Dressing Santyl Ointment Discharge Instruction: Apply nickel thick amount to wound bed as instructed Skintegrity Hydrogel, 4 (oz) Discharge Instruction: apply thin layer over santyl Secondary Dressing Woven Gauze Sponge, Non-Sterile 4x4 in Discharge Instruction: Apply over primary dressing as directed. Secured With [employer] [employer] Roll Sterile, 4.5x3.1 (in/yd) Discharge Instruction: Secure with Kerlix as directed. Paper Tape, 2x10 (in/yd) Discharge Instruction: Secure dressing with tape as directed. Compression Wrap Compression Stockings Add-Ons Electronic  Signature(s) Signed: 04/24/2021 3:13:54 PM By: Karl Itoawkins, Destiny Signed: 04/25/2021 5:24:19 PM By: Fonnie MuBreedlove, [name] RN Entered By: Karl Itoawkins, Destiny on 04/21/2021 09:14:51 -------------------------------------------------------------------------------- Vitals Details Patient Name: Date of Service: Kelly MillingNEWKIRK, [name] 04/21/2021 8:30 A M Medical Record Number: [id number] Patient Account Number: [id number] Date of Birth/Sex: Treating RN: November 27, 1995 (25 y.o. Billy CoastF) Boehlein, [name] Primary Care Marsean Elkhatib: Dione BoozePartridge, James Other Clinician: Referring Mairead Schwarzkopf: Treating Abdulwahab Demelo/Extender: Gennaro AfricaHoffman, Jessica Partridge, James Weeks in Treatment: 9 Vital Signs Time Taken: 08:41 Temperature (F): 98.1 Height (in): 62 Pulse (bpm): 117 Respiratory Rate (breaths/min): 16 Blood Pressure (mmHg): 168/98 Reference Range: 80 - 120 mg / dl Electronic Signature(s) Signed: 04/24/2021 3:13:54 PM By: Karl Itoawkins, Destiny Signed: 04/24/2021 3:13:54 PM By: Karl Itoawkins, Destiny Entered By: Karl Itoawkins, Destiny on 04/21/2021 08:42:13

## 2021-05-02 ENCOUNTER — Encounter (HOSPITAL_BASED_OUTPATIENT_CLINIC_OR_DEPARTMENT_OTHER): Payer: BC Managed Care – PPO | Attending: Internal Medicine | Admitting: Internal Medicine

## 2021-05-09 ENCOUNTER — Encounter (HOSPITAL_BASED_OUTPATIENT_CLINIC_OR_DEPARTMENT_OTHER): Payer: BC Managed Care – PPO | Attending: Internal Medicine | Admitting: Internal Medicine

## 2021-05-09 ENCOUNTER — Other Ambulatory Visit: Payer: Self-pay

## 2021-05-09 DIAGNOSIS — X58XXXD Exposure to other specified factors, subsequent encounter: Secondary | ICD-10-CM | POA: Insufficient documentation

## 2021-05-09 DIAGNOSIS — I890 Lymphedema, not elsewhere classified: Secondary | ICD-10-CM | POA: Insufficient documentation

## 2021-05-09 DIAGNOSIS — J45909 Unspecified asthma, uncomplicated: Secondary | ICD-10-CM | POA: Diagnosis not present

## 2021-05-09 DIAGNOSIS — L97829 Non-pressure chronic ulcer of other part of left lower leg with unspecified severity: Secondary | ICD-10-CM | POA: Diagnosis not present

## 2021-05-09 DIAGNOSIS — S91002A Unspecified open wound, left ankle, initial encounter: Secondary | ICD-10-CM | POA: Diagnosis not present

## 2021-05-09 DIAGNOSIS — L97819 Non-pressure chronic ulcer of other part of right lower leg with unspecified severity: Secondary | ICD-10-CM | POA: Insufficient documentation

## 2021-05-09 DIAGNOSIS — M329 Systemic lupus erythematosus, unspecified: Secondary | ICD-10-CM | POA: Diagnosis not present

## 2021-05-09 DIAGNOSIS — S81802A Unspecified open wound, left lower leg, initial encounter: Secondary | ICD-10-CM | POA: Diagnosis not present

## 2021-05-09 DIAGNOSIS — S91002D Unspecified open wound, left ankle, subsequent encounter: Secondary | ICD-10-CM | POA: Diagnosis not present

## 2021-05-09 DIAGNOSIS — S81802D Unspecified open wound, left lower leg, subsequent encounter: Secondary | ICD-10-CM | POA: Diagnosis not present

## 2021-05-09 DIAGNOSIS — M439 Deforming dorsopathy, unspecified: Secondary | ICD-10-CM | POA: Diagnosis not present

## 2021-05-09 DIAGNOSIS — M6639 Spontaneous rupture of flexor tendons, multiple sites: Secondary | ICD-10-CM | POA: Diagnosis not present

## 2021-05-09 NOTE — Progress Notes (Signed)
BRYTTANY, TORTORELLI ([id number]) Visit Report for 05/09/2021 Chief Complaint Document Details Patient Name: Date of Service: AUDERY, WASSENAAR. 05/09/2021 3:15 PM Medical Record Number: [id number] Patient Account Number: [id number] Date of Birth/Sex: Treating RN: 26-Feb-1996 (25 y.o. [name] Primary Care Provider: Dione Booze Other Clinician: Referring Provider: Treating Provider/Extender: Gennaro Africa in Treatment: 12 Information Obtained from: Patient Chief Complaint Bilateral lower extremity wounds 04/21/21: left lower extremity wound s/p cellulitis Electronic Signature(s) Signed: 05/09/2021 4:34:34 PM By: Geralyn Corwin DO Entered By: Geralyn Corwin on 05/09/2021 16:22:43 -------------------------------------------------------------------------------- Debridement Details Patient Name: Date of Service: [name] 05/09/2021 3:15 PM Medical Record Number: [id number] Patient Account Number: [id number] Date of Birth/Sex: Treating RN: 01/20/1996 (25 y.o. Debara Pickett, Millard.Loa Primary Care Provider: Dione Booze Other Clinician: Referring Provider: Treating Provider/Extender: Gennaro Africa in Treatment: 12 Debridement Performed for Assessment: Wound #4 Left,Lateral Ankle Performed By: Physician Geralyn Corwin, DO Debridement Type: Debridement Level of Consciousness (Pre-procedure): Awake and Alert Pre-procedure Verification/Time Out Yes - 15:55 Taken: Start Time: 15:56 Pain Control: Other : benzocaine 20% T Area Debrided (L x W): otal 4 (cm) x 4 (cm) = 16 (cm) Tissue and other material debrided: Non-Viable, Eschar Level: Non-Viable Tissue Debridement Description: Selective/Open Wound Instrument: Blade, Forceps Bleeding: None Hemostasis Achieved: Pressure End Time: 16:05 Procedural Pain: 2 Post Procedural Pain: 4 Response to Treatment: Procedure was tolerated well Level of Consciousness  (Post- Awake and Alert procedure): Post Debridement Measurements of Total Wound Length: (cm) 5.2 Width: (cm) 5.5 Depth: (cm) 0.6 Volume: (cm) 13.477 Character of Wound/Ulcer Post Debridement: Requires Further Debridement Post Procedure Diagnosis Same as Pre-procedure Electronic Signature(s) Signed: 05/09/2021 4:34:34 PM By: Geralyn Corwin DO Signed: 05/09/2021 5:09:46 PM By: Shawn Stall RN, BSN Entered By: Shawn Stall on 05/09/2021 16:06:26 -------------------------------------------------------------------------------- Debridement Details Patient Name: Date of Service: [name] 05/09/2021 3:15 PM Medical Record Number: [id number] Patient Account Number: [id number] Date of Birth/Sex: Treating RN: [birth year] (25 y.o. Debara Pickett, Yvonne Kendall Primary Care Provider: Dione Booze Other Clinician: Referring Provider: Treating Provider/Extender: Gennaro Africa in Treatment: 12 Debridement Performed for Assessment: Wound #5 Left,Anterior Ankle Performed By: Clinician Shawn Stall, RN Debridement Type: Chemical/Enzymatic/Mechanical Agent Used: Santyl Level of Consciousness (Pre-procedure): Awake and Alert Pre-procedure Verification/Time Out No Taken: Bleeding: None Hemostasis Achieved: Pressure Response to Treatment: Procedure was tolerated well Level of Consciousness (Post- Awake and Alert procedure): Post Debridement Measurements of Total Wound Length: (cm) 1.8 Width: (cm) 1.5 Depth: (cm) 0.4 Volume: (cm) 0.848 Character of Wound/Ulcer Post Debridement: Requires Further Debridement Post Procedure Diagnosis Same as Pre-procedure Electronic Signature(s) Signed: 05/09/2021 4:34:34 PM By: Geralyn Corwin DO Signed: 05/09/2021 5:09:46 PM By: Shawn Stall RN, BSN Entered By: Shawn Stall on 05/09/2021 16:09:19 -------------------------------------------------------------------------------- Debridement Details Patient Name: Date of  Service: [name] 05/09/2021 3:15 PM Medical Record Number: [id number] Patient Account Number: [id number] Date of Birth/Sex: Treating RN: Mar 25, 1996 (25 y.o. Debara Pickett, Yvonne Kendall Primary Care Provider: Dione Booze Other Clinician: Referring Provider: Treating Provider/Extender: Gennaro Africa in Treatment: 12 Debridement Performed for Assessment: Wound #3 Left,Posterior Lower Leg Performed By: Clinician Shawn Stall, RN Debridement Type: Chemical/Enzymatic/Mechanical Agent Used: Santyl Level of Consciousness (Pre-procedure): Awake and Alert Pre-procedure Verification/Time Out No Taken: Bleeding: None Hemostasis Achieved: Pressure Response to Treatment: Procedure was tolerated well Level of Consciousness (Post- Awake and Alert procedure): Post Debridement Measurements of Total Wound Length: (cm) 1.6 Width: (cm) 1.5 Depth: (cm) 1.5 Volume: (  cm) 2.827 Character of Wound/Ulcer Post Debridement: Requires Further Debridement Post Procedure Diagnosis Same as Pre-procedure Electronic Signature(s) Signed: 05/09/2021 4:34:34 PM By: Geralyn Corwin DO Signed: 05/09/2021 5:09:46 PM By: Shawn Stall RN, BSN Entered By: Shawn Stall on 05/09/2021 16:09:38 -------------------------------------------------------------------------------- Debridement Details Patient Name: Date of Service: [name] 05/09/2021 3:15 PM Medical Record Number: [id number] Patient Account Number: [id number] Date of Birth/Sex: Treating RN: April 21, 1996 (25 y.o. Debara Pickett, Yvonne Kendall Primary Care Provider: Dione Booze Other Clinician: Referring Provider: Treating Provider/Extender: Gennaro Africa in Treatment: 12 Debridement Performed for Assessment: Wound #6 Left,Distal,Posterior Lower Leg Performed By: Clinician Shawn Stall, RN Debridement Type: Chemical/Enzymatic/Mechanical Agent Used: Santyl Level of Consciousness  (Pre-procedure): Awake and Alert Pre-procedure Verification/Time Out No Taken: Bleeding: None Hemostasis Achieved: Pressure Response to Treatment: Procedure was tolerated well Level of Consciousness (Post- Awake and Alert procedure): Post Debridement Measurements of Total Wound Length: (cm) 1.8 Width: (cm) 0.6 Depth: (cm) 0.3 Volume: (cm) 0.254 Character of Wound/Ulcer Post Debridement: Requires Further Debridement Post Procedure Diagnosis Same as Pre-procedure Electronic Signature(s) Signed: 05/09/2021 4:34:34 PM By: Geralyn Corwin DO Signed: 05/09/2021 5:09:46 PM By: Shawn Stall RN, BSN Entered By: Shawn Stall on 05/09/2021 16:09:57 -------------------------------------------------------------------------------- HPI Details Patient Name: Date of Service: [name] 05/09/2021 3:15 PM Medical Record Number: [id number] Patient Account Number: [id number] Date of Birth/Sex: Treating RN: 09/21/1995 (25 y.o. [name] Primary Care Provider: Dione Booze Other Clinician: Referring Provider: Treating Provider/Extender: Gennaro Africa in Treatment: 12 History of Present Illness HPI Description: Admission 6/21 Ms. Ekaterina Denise is A 25 year old female with a past medical history of systemic lupus erythematosus that presents with bilateral lower extremity wounds that started in April 2022. She has had skin issues in the past where she developed very small wounds but these healed with time. She has never had open wounds like she does on her legs. She currently reports minimal drainage to the wounds. She does have pain to these areas but has been overall stable. She denies signs of infection. She has visited the ED for this issue and was recently prescribed doxycycline for possible cellulitis. She follows with [facility] for her SLE and is currently on Plaquenil, prednisone and CellCept. 6/27; patient presents for 1 week  follow-up. She states she saw her rheumatologist on 6/22 and prednisone was increased from 10 mg to 20 mg daily. She is currently at the highest dose of Plaquenil and CellCept and is going to try an infusion later today at her regimen. She reports improvement to the wounds. She has been using collagen with dressing changes. She currently denies signs of infection. Readmission 8/26 Patient presents to clinic today for open wounds to her left lower extremity. Her previous wounds that were treated have closed. She states that on 8/9 she developed cellulitis and was treated with 2 rounds of antibiotics. Her cellulitis has resolved. She had blisters that opened and are now large wounds. She has seen her dermatologist and rheumatologist since she has systemic lupus and a hard time healing. No changes have been made to her medications. She was recommended antibiotic ointment by her dermatologist to her wounds She picked up the antibiotic ointment today.. She reports pain to these areas. She denies purulent drainage, increased warmth or erythema to the left lower extremity. She currently keeps the areas covered. Of note she had an Achilles tendon rupture on her right leg and is currently in a soft cast. 9/13; patient presents for  follow-up. She missed her last clinic appointment. She has been using Santyl to the wound beds daily. She has no issues or complaints today. She reports the Santyl is helping clean up the wounds. She currently denies signs of infection. Electronic Signature(s) Signed: 05/09/2021 4:34:34 PM By: Geralyn Corwin DO Entered By: Geralyn Corwin on 05/09/2021 16:23:19 -------------------------------------------------------------------------------- Physical Exam Details Patient Name: Date of Service: [name] 05/09/2021 3:15 PM Medical Record Number: [id number] Patient Account Number: [id number] Date of Birth/Sex: Treating RN: 02/09/1996 (25 y.o. [name] Primary  Care Provider: Dione Booze Other Clinician: Referring Provider: Treating Provider/Extender: Gennaro Africa in Treatment: 12 Constitutional respirations regular, non-labored and within target range for patient.. Cardiovascular 2+ dorsalis pedis/posterior tibialis pulses. Psychiatric pleasant and cooperative. Notes Left lower extremity: 4 wounds present. 3 are eschared and 1 is open with granulation tissue and slough. No signs of infection. Electronic Signature(s) Signed: 05/09/2021 4:34:34 PM By: Geralyn Corwin DO Entered By: Geralyn Corwin on 05/09/2021 16:26:13 -------------------------------------------------------------------------------- Physician Orders Details Patient Name: Date of Service: [name] 05/09/2021 3:15 PM Medical Record Number: [id number] Patient Account Number: [id number] Date of Birth/Sex: Treating RN: Nov 13, 1995 (25 y.o. Debara Pickett, Yvonne Kendall Primary Care Provider: Dione Booze Other Clinician: Referring Provider: Treating Provider/Extender: Gennaro Africa in Treatment: 12 Verbal / Phone Orders: No Diagnosis Coding ICD-10 Coding Code Description S81.802D Unspecified open wound, left lower leg, subsequent encounter S91.002D Unspecified open wound, left ankle, subsequent encounter M32.9 Systemic lupus erythematosus, unspecified Follow-up Appointments ppointment in 1 week. - Dr. Mikey Bussing Return A Bathing/ Shower/ Hygiene May shower and wash wound with soap and water. Edema Control - Lymphedema / SCD / Other Elevate legs to the level of the heart or above for 30 minutes daily and/or when sitting, a frequency of: - 3-4 times a day throughout the day. Avoid standing for long periods of time. Additional Orders / Instructions Follow Nutritious Diet Wound Treatment Wound #3 - Lower Leg Wound Laterality: Left, Posterior Cleanser: Soap and Water 1 x Per Day/30 Days Discharge Instructions:  May shower and wash wound with dial antibacterial soap and water prior to dressing change. Prim Dressing: Santyl Ointment 1 x Per Day/30 Days ary Discharge Instructions: Apply nickel thick amount to wound bed as instructed Secondary Dressing: Woven Gauze Sponge, Non-Sterile 4x4 in (Generic) 1 x Per Day/30 Days Discharge Instructions: Apply over primary dressing as directed. Secured With: [employer], 4.5x3.1 (in/yd) (Generic) 1 x Per Day/30 Days Discharge Instructions: Secure with Kerlix as directed. Secured With: Paper Tape, 2x10 (in/yd) (Generic) 1 x Per Day/30 Days Discharge Instructions: Secure dressing with tape as directed. Wound #4 - Ankle Wound Laterality: Left, Lateral Cleanser: Soap and Water 1 x Per Day/30 Days Discharge Instructions: May shower and wash wound with dial antibacterial soap and water prior to dressing change. Prim Dressing: Santyl Ointment 1 x Per Day/30 Days ary Discharge Instructions: Apply nickel thick amount to wound bed as instructed Secondary Dressing: Woven Gauze Sponge, Non-Sterile 4x4 in (Generic) 1 x Per Day/30 Days Discharge Instructions: Apply over primary dressing as directed. Secured With: [employer], 4.5x3.1 (in/yd) (Generic) 1 x Per Day/30 Days Discharge Instructions: Secure with Kerlix as directed. Secured With: Paper Tape, 2x10 (in/yd) (Generic) 1 x Per Day/30 Days Discharge Instructions: Secure dressing with tape as directed. Wound #5 - Ankle Wound Laterality: Left, Anterior Cleanser: Soap and Water 1 x Per Day/30 Days Discharge Instructions: May shower and wash wound with dial antibacterial soap  and water prior to dressing change. Prim Dressing: Santyl Ointment 1 x Per Day/30 Days ary Discharge Instructions: Apply nickel thick amount to wound bed as instructed Secondary Dressing: Woven Gauze Sponge, Non-Sterile 4x4 in (Generic) 1 x Per Day/30 Days Discharge Instructions: Apply over primary dressing as directed. Secured With:  [employer], 4.5x3.1 (in/yd) (Generic) 1 x Per Day/30 Days Discharge Instructions: Secure with Kerlix as directed. Secured With: Paper Tape, 2x10 (in/yd) (Generic) 1 x Per Day/30 Days Discharge Instructions: Secure dressing with tape as directed. Wound #6 - Lower Leg Wound Laterality: Left, Posterior, Distal Cleanser: Soap and Water 1 x Per Day/30 Days Discharge Instructions: May shower and wash wound with dial antibacterial soap and water prior to dressing change. Prim Dressing: Santyl Ointment 1 x Per Day/30 Days ary Discharge Instructions: Apply nickel thick amount to wound bed as instructed Secondary Dressing: Woven Gauze Sponge, Non-Sterile 4x4 in (Generic) 1 x Per Day/30 Days Discharge Instructions: Apply over primary dressing as directed. Secured With: [employer], 4.5x3.1 (in/yd) (Generic) 1 x Per Day/30 Days Discharge Instructions: Secure with Kerlix as directed. Secured With: Paper Tape, 2x10 (in/yd) (Generic) 1 x Per Day/30 Days Discharge Instructions: Secure dressing with tape as directed. Patient Medications llergies: No Known Allergies A Notifications Medication Indication Start End 05/09/2021 Santyl DOSE 1 - topical 250 unit/gram ointment - 1 application daily Electronic Signature(s) Signed: 05/09/2021 4:34:34 PM By: Geralyn Corwin DO Previous Signature: 05/09/2021 4:33:36 PM Version By: Geralyn Corwin DO Entered By: Geralyn Corwin on 05/09/2021 16:33:56 -------------------------------------------------------------------------------- Problem List Details Patient Name: Date of Service: [name] 05/09/2021 3:15 PM Medical Record Number: [id number] Patient Account Number: [id number] Date of Birth/Sex: Treating RN: 1996-04-29 (25 y.o. Debara Pickett, Yvonne Kendall Primary Care Provider: Dione Booze Other Clinician: Referring Provider: Treating Provider/Extender: Gennaro Africa in Treatment: 12 Active  Problems ICD-10 Encounter Code Description Active Date MDM Diagnosis S81.802D Unspecified open wound, left lower leg, subsequent encounter 05/09/2021 No Yes S91.002D Unspecified open wound, left ankle, subsequent encounter 05/09/2021 No Yes M32.9 Systemic lupus erythematosus, unspecified 02/14/2021 No Yes Inactive Problems ICD-10 Code Description Active Date Inactive Date L97.819 Non-pressure chronic ulcer of other part of right lower leg with unspecified severity 02/13/2021 02/13/2021 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 with unspecified severity 02/13/2021 02/13/2021 S91.002A Unspecified open wound, left ankle, initial encounter 04/21/2021 04/21/2021 U31.497W Unspecified open wound, left lower leg, initial encounter 04/21/2021 04/21/2021 Resolved Problems Electronic Signature(s) Signed: 05/09/2021 4:34:34 PM By: Geralyn Corwin DO Entered By: Geralyn Corwin on 05/09/2021 16:20:42 -------------------------------------------------------------------------------- Progress Note Details Patient Name: Date of Service: [name] 05/09/2021 3:15 PM Medical Record Number: [id number] Patient Account Number: [id number] Date of Birth/Sex: Treating RN: [birth year] (25 y.o. [name] Primary Care Provider: Dione Booze Other Clinician: Referring Provider: Treating Provider/Extender: Gennaro Africa in Treatment: 12 Subjective Chief Complaint Information obtained from Patient Bilateral lower extremity wounds 04/21/21: left lower extremity wound s/p cellulitis History of Present Illness (HPI) Admission 6/21 Ms. Nakina Spatz is A 25 year old female with a past medical history of systemic lupus erythematosus that presents with bilateral lower extremity wounds that started in April 2022. She has had skin issues in the past where she developed very small wounds but these healed with time. She has never had open wounds like she  does on her legs. She currently reports minimal drainage to the wounds. She does have pain to these areas but has been overall stable. She  denies signs of infection. She has visited the ED for this issue and was recently prescribed doxycycline for possible cellulitis. She follows with [facility] for her SLE and is currently on Plaquenil, prednisone and CellCept. 6/27; patient presents for 1 week follow-up. She states she saw her rheumatologist on 6/22 and prednisone was increased from 10 mg to 20 mg daily. She is currently at the highest dose of Plaquenil and CellCept and is going to try an infusion later today at her regimen. She reports improvement to the wounds. She has been using collagen with dressing changes. She currently denies signs of infection. Readmission 8/26 Patient presents to clinic today for open wounds to her left lower extremity. Her previous wounds that were treated have closed. She states that on 8/9 she developed cellulitis and was treated with 2 rounds of antibiotics. Her cellulitis has resolved. She had blisters that opened and are now large wounds. She has seen her dermatologist and rheumatologist since she has systemic lupus and a hard time healing. No changes have been made to her medications. She was recommended antibiotic ointment by her dermatologist to her wounds She picked up the antibiotic ointment today.. She reports pain to these areas. She denies purulent drainage, increased warmth or erythema to the left lower extremity. She currently keeps the areas covered. Of note she had an Achilles tendon rupture on her right leg and is currently in a soft cast. 9/13; patient presents for follow-up. She missed her last clinic appointment. She has been using Santyl to the wound beds daily. She has no issues or complaints today. She reports the Santyl is helping clean up the wounds. She currently denies signs of infection. Patient History Information obtained from  Patient. Family History Diabetes - Paternal Grandparents, Hypertension - Paternal Grandparents, Thyroid Problems - Maternal Grandparents,Paternal Grandparents, No family history of Cancer, Heart Disease, Hereditary Spherocytosis, Kidney Disease, Lung Disease, Seizures, Stroke, Tuberculosis. Social History Never smoker, Marital Status - Married, Alcohol Use - Moderate, Drug Use - Current History - Marijuana, Caffeine Use - Daily - Coffee. Medical History Respiratory Patient has history of Asthma Immunological Patient has history of Lupus Erythematosus Objective Constitutional respirations regular, non-labored and within target range for patient.. Vitals Time Taken: 3:27 PM, Height: 62 in, Temperature: 99.4 F, Pulse: 83 bpm, Respiratory Rate: 20 breaths/min, Blood Pressure: 123/80 mmHg. Cardiovascular 2+ dorsalis pedis/posterior tibialis pulses. Psychiatric pleasant and cooperative. General Notes: Left lower extremity: 4 wounds present. 3 are eschared and 1 is open with granulation tissue and slough. No signs of infection. Integumentary (Hair, Skin) Wound #1 status is Open. Original cause of wound was Other Lesion. The date acquired was: 01/17/2021. The wound has been in treatment 12 weeks. The wound is located on the Left,Medial Lower Leg. The wound measures 0cm length x 0cm width x 0cm depth; 0cm^2 area and 0cm^3 volume. There is no tunneling or undermining noted. There is a none present amount of drainage noted. The wound margin is well defined and not attached to the wound base. There is no granulation within the wound bed. There is no necrotic tissue within the wound bed. Wound #3 status is Open. Original cause of wound was Gradually Appeared. The date acquired was: 04/14/2021. The wound has been in treatment 2 weeks. The wound is located on the Left,Posterior Lower Leg. The wound measures 1.6cm length x 1.5cm width x 1.5cm depth; 1.885cm^2 area and 2.827cm^3 volume. There is Fat Layer  (Subcutaneous Tissue) exposed. There is no tunneling noted, however, there is  undermining starting at 12:00 and ending at 12:00 with a maximum distance of 0.7cm. There is a medium amount of serosanguineous drainage noted. The wound margin is distinct with the outline attached to the wound base. There is large (67-100%) red, pink granulation within the wound bed. There is a small (1-33%) amount of necrotic tissue within the wound bed including Adherent Slough. Wound #4 status is Open. Original cause of wound was Gradually Appeared. The date acquired was: 04/14/2021. The wound has been in treatment 2 weeks. The wound is located on the Left,Lateral Ankle. The wound measures 5.2cm length x 5.5cm width x 0.6cm depth; 22.462cm^2 area and 13.477cm^3 volume. There is Fat Layer (Subcutaneous Tissue) exposed. There is no tunneling noted, however, there is undermining starting at 6:00 and ending at 12:00 with a maximum distance of 0.3cm. There is a medium amount of serosanguineous drainage noted. The wound margin is distinct with the outline attached to the wound base. There is small (1-33%) red, pink granulation within the wound bed. There is a large (67-100%) amount of necrotic tissue within the wound bed including Eschar and Adherent Slough. Wound #5 status is Open. Original cause of wound was Gradually Appeared. The date acquired was: 04/14/2021. The wound has been in treatment 2 weeks. The wound is located on the Left,Anterior Ankle. The wound measures 1.8cm length x 1.5cm width x 0.4cm depth; 2.121cm^2 area and 0.848cm^3 volume. There is Fat Layer (Subcutaneous Tissue) exposed. There is no tunneling or undermining noted. There is a medium amount of serosanguineous drainage noted. The wound margin is distinct with the outline attached to the wound base. There is small (1-33%) pink granulation within the wound bed. There is a large (67-100%) amount of necrotic tissue within the wound bed including Eschar and  Adherent Slough. Wound #6 status is Open. Original cause of wound was Gradually Appeared. The date acquired was: 04/14/2021. The wound has been in treatment 2 weeks. The wound is located on the Left,Distal,Posterior Lower Leg. The wound measures 1cm length x 0.6cm width x 0.3cm depth; 0.471cm^2 area and 0.141cm^3 volume. There is no tunneling or undermining noted. There is a medium amount of serosanguineous drainage noted. The wound margin is distinct with the outline attached to the wound base. There is no granulation within the wound bed. There is a large (67-100%) amount of necrotic tissue within the wound bed including Eschar. Assessment Active Problems ICD-10 Unspecified open wound, left lower leg, subsequent encounter Unspecified open wound, left ankle, subsequent encounter Systemic lupus erythematosus, unspecified Patient's wounds are stable. The eschar is trying to remove itself with the use of Santyl. I debrided some nonviable tissue today. I crosshatched eschar regions. I recommended continuing Santyl to all wound beds. No obvious signs of infection. Patient had normal ABIs and TBI's. I do think that the underlying issue is related to her SLE. She is following with rheumatology for possible infusions. She is not ready to do these at this time. Procedures Wound #3 Pre-procedure diagnosis of Wound #3 is a Cellulitis located on the Left,Posterior Lower Leg . There was a Chemical/Enzymatic/Mechanical debridement performed by Shawn Stall, RN.Marland Kitchen Agent used was [employer]. There was no bleeding. The procedure was tolerated well. Post Debridement Measurements: 1.6cm length x 1.5cm width x 1.5cm depth; 2.827cm^3 volume. Character of Wound/Ulcer Post Debridement requires further debridement. Post procedure Diagnosis Wound #3: Same as Pre-Procedure Wound #4 Pre-procedure diagnosis of Wound #4 is a Cellulitis located on the Left,Lateral Ankle . There was a Selective/Open Wound Non-Viable Tissue  Debridement with a total area of 16 sq cm performed by Geralyn Corwin, DO. With the following instrument(s): Blade, and Forceps to remove Non-Viable tissue/material. Material removed includes Eschar after achieving pain control using Other (benzocaine 20%). A time out was conducted at 15:55, prior to the start of the procedure. There was no bleeding. The procedure was tolerated well with a pain level of 2 throughout and a pain level of 4 following the procedure. Post Debridement Measurements: 5.2cm length x 5.5cm width x 0.6cm depth; 13.477cm^3 volume. Character of Wound/Ulcer Post Debridement requires further debridement. Post procedure Diagnosis Wound #4: Same as Pre-Procedure Wound #5 Pre-procedure diagnosis of Wound #5 is a Cellulitis located on the Left,Anterior Ankle . There was a Chemical/Enzymatic/Mechanical debridement performed by Shawn Stall, RN.Marland Kitchen Agent used was [employer]. There was no bleeding. The procedure was tolerated well. Post Debridement Measurements: 1.8cm length x 1.5cm width x 0.4cm depth; 0.848cm^3 volume. Character of Wound/Ulcer Post Debridement requires further debridement. Post procedure Diagnosis Wound #5: Same as Pre-Procedure Wound #6 Pre-procedure diagnosis of Wound #6 is a Lupus located on the Left,Distal,Posterior Lower Leg . There was a Chemical/Enzymatic/Mechanical debridement performed by Shawn Stall, RN.Marland Kitchen Agent used was [employer]. There was no bleeding. The procedure was tolerated well. Post Debridement Measurements: 1.8cm length x 0.6cm width x 0.3cm depth; 0.254cm^3 volume. Character of Wound/Ulcer Post Debridement requires further debridement. Post procedure Diagnosis Wound #6: Same as Pre-Procedure Plan Follow-up Appointments: Return Appointment in 1 week. - Dr. Mikey Bussing Bathing/ Shower/ Hygiene: May shower and wash wound with soap and water. Edema Control - Lymphedema / SCD / Other: Elevate legs to the level of the heart or above for 30 minutes daily  and/or when sitting, a frequency of: - 3-4 times a day throughout the day. Avoid standing for long periods of time. Additional Orders / Instructions: Follow Nutritious Diet The following medication(s) was prescribed: Santyl topical 250 unit/gram ointment 1 1 application daily starting 05/09/2021 WOUND #3: - Lower Leg Wound Laterality: Left, Posterior Cleanser: Soap and Water 1 x Per Day/30 Days Discharge Instructions: May shower and wash wound with dial antibacterial soap and water prior to dressing change. Prim Dressing: Santyl Ointment 1 x Per Day/30 Days ary Discharge Instructions: Apply nickel thick amount to wound bed as instructed Secondary Dressing: Woven Gauze Sponge, Non-Sterile 4x4 in (Generic) 1 x Per Day/30 Days Discharge Instructions: Apply over primary dressing as directed. Secured With: [employer], 4.5x3.1 (in/yd) (Generic) 1 x Per Day/30 Days Discharge Instructions: Secure with Kerlix as directed. Secured With: Paper T ape, 2x10 (in/yd) (Generic) 1 x Per Day/30 Days Discharge Instructions: Secure dressing with tape as directed. WOUND #4: - Ankle Wound Laterality: Left, Lateral Cleanser: Soap and Water 1 x Per Day/30 Days Discharge Instructions: May shower and wash wound with dial antibacterial soap and water prior to dressing change. Prim Dressing: Santyl Ointment 1 x Per Day/30 Days ary Discharge Instructions: Apply nickel thick amount to wound bed as instructed Secondary Dressing: Woven Gauze Sponge, Non-Sterile 4x4 in (Generic) 1 x Per Day/30 Days Discharge Instructions: Apply over primary dressing as directed. Secured With: [employer], 4.5x3.1 (in/yd) (Generic) 1 x Per Day/30 Days Discharge Instructions: Secure with Kerlix as directed. Secured With: Paper T ape, 2x10 (in/yd) (Generic) 1 x Per Day/30 Days Discharge Instructions: Secure dressing with tape as directed. WOUND #5: - Ankle Wound Laterality: Left, Anterior Cleanser: Soap and Water 1 x Per  Day/30 Days Discharge Instructions: May shower and wash wound with dial antibacterial soap and water  prior to dressing change. Prim Dressing: Santyl Ointment 1 x Per Day/30 Days ary Discharge Instructions: Apply nickel thick amount to wound bed as instructed Secondary Dressing: Woven Gauze Sponge, Non-Sterile 4x4 in (Generic) 1 x Per Day/30 Days Discharge Instructions: Apply over primary dressing as directed. Secured With: [employer], 4.5x3.1 (in/yd) (Generic) 1 x Per Day/30 Days Discharge Instructions: Secure with Kerlix as directed. Secured With: Paper T ape, 2x10 (in/yd) (Generic) 1 x Per Day/30 Days Discharge Instructions: Secure dressing with tape as directed. WOUND #6: - Lower Leg Wound Laterality: Left, Posterior, Distal Cleanser: Soap and Water 1 x Per Day/30 Days Discharge Instructions: May shower and wash wound with dial antibacterial soap and water prior to dressing change. Prim Dressing: Santyl Ointment 1 x Per Day/30 Days ary Discharge Instructions: Apply nickel thick amount to wound bed as instructed Secondary Dressing: Woven Gauze Sponge, Non-Sterile 4x4 in (Generic) 1 x Per Day/30 Days Discharge Instructions: Apply over primary dressing as directed. Secured With: [employer], 4.5x3.1 (in/yd) (Generic) 1 x Per Day/30 Days Discharge Instructions: Secure with Kerlix as directed. Secured With: Paper T ape, 2x10 (in/yd) (Generic) 1 x Per Day/30 Days Discharge Instructions: Secure dressing with tape as directed. 1. Santyl 2. In office sharp debridement 3. Follow-up in 1 week Electronic Signature(s) Signed: 05/09/2021 4:34:34 PM By: Geralyn Corwin DO Entered By: Geralyn Corwin on 05/09/2021 16:34:07 -------------------------------------------------------------------------------- HxROS Details Patient Name: Date of Service: [name] 05/09/2021 3:15 PM Medical Record Number: [id number] Patient Account Number: [id number] Date of  Birth/Sex: Treating RN: [birth year] (25 y.o. Debara Pickett, Yvonne Kendall Primary Care Provider: Dione Booze Other Clinician: Referring Provider: Treating Provider/Extender: Gennaro Africa in Treatment: 12 Information Obtained From Patient Respiratory Medical History: Positive for: Asthma Immunological Medical History: Positive for: Lupus Erythematosus Immunizations Pneumococcal Vaccine: Received Pneumococcal Vaccination: No Implantable Devices None Family and Social History Cancer: No; Diabetes: Yes - Paternal Grandparents; Heart Disease: No; Hereditary Spherocytosis: No; Hypertension: Yes - Paternal Grandparents; Kidney Disease: No; Lung Disease: No; Seizures: No; Stroke: No; Thyroid Problems: Yes - Maternal Grandparents,Paternal Grandparents; Tuberculosis: No; Never smoker; Marital Status - Married; Alcohol Use: Moderate; Drug Use: Current History - Marijuana; Caffeine Use: Daily - Coffee; Financial Concerns: No; Food, Clothing or Shelter Needs: No; Support System Lacking: No Electronic Signature(s) Signed: 05/09/2021 4:34:34 PM By: Geralyn Corwin DO Signed: 05/09/2021 5:09:46 PM By: Shawn Stall RN, BSN Entered By: Geralyn Corwin on 05/09/2021 16:23:27 -------------------------------------------------------------------------------- SuperBill Details Patient Name: Date of Service: [name] 05/09/2021 Medical Record Number: [id number] Patient Account Number: [id number] Date of Birth/Sex: Treating RN: May 09, 1996 (25 y.o. Debara Pickett, Yvonne Kendall Primary Care Provider: Dione Booze Other Clinician: Referring Provider: Treating Provider/Extender: Gennaro Africa in Treatment: 12 Diagnosis Coding ICD-10 Codes Code Description [phone] Unspecified open wound, left lower leg, initial encounter S91.002A Unspecified open wound, left ankle, initial encounter M32.9 Systemic lupus erythematosus, unspecified Facility  Procedures CPT4 Code: [id number] Description: 97602 - DEBRIDE W/O ANES NON SELECT Modifier: 59 Quantity: 1 CPT4 Code: [id number] Description: 97597 - DEBRIDE WOUND 1ST 20 SQ CM OR < ICD-10 Diagnosis Description S91.002A Unspecified open wound, left ankle, initial encounter Modifier: Quantity: 1 Physician Procedures : CPT4 Code Description Modifier [id number] - WC PHYS DEBR WO ANESTH 20 SQ CM ICD-10 Diagnosis Description S91.002A Unspecified open wound, left ankle, initial encounter Quantity: 1 Electronic Signature(s) Signed: 05/09/2021 4:34:34 PM By: Geralyn Corwin DO Entered By: Geralyn Corwin on 05/09/2021 16:34:19

## 2021-05-09 NOTE — Progress Notes (Signed)
Kelly [name], Kelly [name] ([id number]) Visit Report for 05/09/2021 Arrival Information Details Patient Name: Date of Service: Kelly [name], Kelly [name]. 05/09/2021 3:15 PM Medical Record Number: [id number] Patient Account Number: [id number] Date of Birth/Sex: Treating RN: [birth year] (25 y.o. Kelly [name], Kelly [name] Primary Care Kelly [name]: Kelly [name] Other Clinician: Referring Aditri Louischarles: Treating Kden Wagster/Extender: Gennaro Africa in Treatment: 12 Visit Information History Since Last Visit Added or deleted any medications: No Patient Arrived: Wheel Chair Any new allergies or adverse reactions: No Arrival Time: 15:19 Had a fall or experienced change in No Accompanied By: friend activities of daily living that may affect Transfer Assistance: None risk of falls: Patient Identification Verified: Yes Signs or symptoms of abuse/neglect since last visito No Secondary Verification Process Completed: Yes Hospitalized since last visit: No Patient Requires Transmission-Based Precautions: No Implantable device outside of the clinic excluding No Patient Has Alerts: No cellular tissue based products placed in the center since last visit: Has Dressing in Place as Prescribed: Yes Pain Present Now: Yes Electronic Signature(s) Signed: 05/09/2021 5:09:46 PM By: Shawn Stall RN, BSN Entered By: Shawn Stall on 05/09/2021 15:27:01 -------------------------------------------------------------------------------- Encounter Discharge Information Details Patient Name: Date of Service: Kelly Milling RIA H [name]. 05/09/2021 3:15 PM Medical Record Number: [id number] Patient Account Number: [id number] Date of Birth/Sex: Treating RN: [birth year] (25 y.o. Kelly [name], Kelly [name] Primary Care Kelly [name]: Kelly [name] Other Clinician: Referring Kelly [name]: Treating Morgana Rowley/Extender: Gennaro Africa in Treatment: 12 Encounter Discharge Information Items Post Procedure Vitals Discharge  Condition: Stable Temperature (F): 99.4 Ambulatory Status: Wheelchair Pulse (bpm): 83 Discharge Destination: Home Respiratory Rate (breaths/min): 20 Transportation: Private Auto Blood Pressure (mmHg): 123/80 Accompanied By: friend Schedule Follow-up Appointment: Yes Clinical Summary of Care: Electronic Signature(s) Signed: 05/09/2021 5:09:46 PM By: Shawn Stall RN, BSN Entered By: Shawn Stall on 05/09/2021 16:11:12 -------------------------------------------------------------------------------- Lower Extremity Assessment Details Patient Name: Date of Service: Kelly Milling RIA H [name]. 05/09/2021 3:15 PM Medical Record Number: [id number] Patient Account Number: [id number] Date of Birth/Sex: Treating RN: [birth year] (25 y.o. Kelly [name], Kelly [name] Primary Care Damarko Stitely: Kelly [name] Other Clinician: Referring Jasiri Hanawalt: Treating Khush Pasion/Extender: Gennaro Africa in Treatment: 12 Edema Assessment Assessed: Kyra Searles: Yes] Franne Forts: No] Edema: [Left: Ye] [Right: s] Calf Left: Right: Point of Measurement: 27 cm From Medial Instep 33 cm Ankle Left: Right: Point of Measurement: 8 cm From Medial Instep 22.5 cm Vascular Assessment Pulses: Dorsalis Pedis Palpable: [Left:Yes] Electronic Signature(s) Signed: 05/09/2021 5:09:46 PM By: Shawn Stall RN, BSN Entered By: Shawn Stall on 05/09/2021 15:31:35 -------------------------------------------------------------------------------- Multi Wound Chart Details Patient Name: Date of Service: Kelly Milling RIA H [name]. 05/09/2021 3:15 PM Medical Record Number: [id number] Patient Account Number: [id number] Date of Birth/Sex: Treating RN: [birth year] (25 y.o. Kelly [name], Kelly [name] Primary Care Kelen Laura: Kelly [name] Other Clinician: Referring Owenn Rothermel: Treating Agapita Savarino/Extender: Gennaro Africa in Treatment: 12 Vital Signs Height(in): 62 Pulse(bpm): 83 Weight(lbs): Blood Pressure(mmHg):  123/80 Body Mass Index(BMI): Temperature(F): 99.4 Respiratory Rate(breaths/min): 20 Photos: [1:Left, Medial Lower Leg] [3:Left, Posterior Lower Leg] [4:Left, Lateral Ankle] Wound Location: [1:Other Lesion] [3:Gradually Appeared] [4:Gradually Appeared] Wounding Event: [1:Lupus] [3:Cellulitis] [4:Cellulitis] Primary Etiology: [1:N/A] [3:N/A] [4:Lupus] Secondary Etiology: [1:Asthma, Lupus Erythematosus] [3:Asthma, Lupus Erythematosus] [4:Asthma, Lupus Erythematosus] Comorbid History: [1:01/17/2021] [3:04/14/2021] [4:04/14/2021] Date Acquired: [1:12] [3:2] [4:2] Weeks of Treatment: [1:Open] [3:Open] [4:Open] Wound Status: [1:0x0x0] [3:1.6x1.5x1.5] [4:5.2x5.5x0.6] Measurements L x W x D (cm) [1:0] [3:1.885] [4:22.462] A (cm) : rea [1:0] [3:2.827] [4:13.477] Volume (cm) : [1:100.00%] [3:-53.90%] [4:-43.00%] % Reduction in A rea: [1:100.00%] [3:-92.30%] [4:-114.50%] % Reduction  in Volume: [3:12] [4:6] Starting Position 1 (o'clock): [3:12] [4:12] Ending Position 1 (o'clock): [3:0.7] [4:0.3] Maximum Distance 1 (cm): [1:No] [3:Yes] [4:Yes] Undermining: [1:Full Thickness Without Exposed] [3:Full Thickness With Exposed Support Full Thickness Without Exposed] Classification: [1:Support Structures None Present] [3:Structures Medium] [4:Support Structures Medium] Exudate A mount: [1:N/A] [3:Serosanguineous] [4:Serosanguineous] Exudate Type: [1:N/A] [3:red, brown] [4:red, brown] Exudate Color: [1:Well defined, not attached] [3:Distinct, outline attached] [4:Distinct, outline attached] Wound Margin: [1:None Present (0%)] [3:Large (67-100%)] [4:Small (1-33%)] Granulation A mount: [1:N/A] [3:Red, Pink] [4:Red, Pink] Granulation Quality: [1:None Present (0%)] [3:Small (1-33%)] [4:Large (67-100%)] Necrotic A mount: [1:N/A] [3:Adherent Slough] [4:Eschar, Adherent Slough] Necrotic Tissue: [1:Fascia: No] [3:Fat Layer (Subcutaneous Tissue): Yes Fat Layer (Subcutaneous Tissue): Yes] Exposed  Structures: [1:Fat Layer (Subcutaneous Tissue): No Fascia: No Tendon: No Muscle: No Joint: No Bone: No Large (67-100%)] [3:Tendon: No Muscle: No Joint: No Bone: No N/A] [4:Fascia: No Tendon: No Muscle: No Joint: No Bone: No None] Epithelialization: [1:N/A] [3:Chemical/Enzymatic/Mechanical] [4:Debridement - Selective/Open Wound] Debridement: Pre-procedure Verification/Time Out N/A [3:N/A] [4:15:55] Taken: [1:N/A] [3:N/A] [4:Other] Pain Control: [1:N/A] [3:N/A] [4:Necrotic/Eschar] Tissue Debrided: [1:N/A] [3:N/A] [4:Non-Viable Tissue] Level: [1:N/A] [3:N/A] [4:16] Debridement A (sq cm): [1:rea N/A] [3:N/A] [4:Blade, Forceps] Instrument: [1:N/A] [3:None] [4:None] Bleeding: [1:N/A] [3:Pressure] [4:Pressure] Hemostasis A chieved: [1:N/A] [3:N/A] [4:2] Procedural Pain: [1:N/A] [3:N/A] [4:4] Post Procedural Pain: [1:N/A] [3:Procedure was tolerated well] [4:Procedure was tolerated well] Debridement Treatment Response: [1:N/A] [3:1.6x1.5x1.5] [4:5.2x5.5x0.6] Post Debridement Measurements L x W x D (cm) [1:N/A] [3:2.827] [4:13.477] Post Debridement Volume: (cm) [1:N/A] [3:Debridement] [4:Debridement] Wound Number: 5 6 N/A Photos: N/A Left, Anterior Ankle Left, Distal, Posterior Lower Leg N/A Wound Location: Gradually Appeared Gradually Appeared N/A Wounding Event: Cellulitis Lupus N/A Primary Etiology: Lupus N/A N/A Secondary Etiology: Asthma, Lupus Erythematosus Asthma, Lupus Erythematosus N/A Comorbid History: 04/14/2021 04/14/2021 N/A Date Acquired: 2 2 N/A Weeks of Treatment: Open Open N/A Wound Status: 1.8x1.5x0.4 1x0.6x0.3 N/A Measurements L x W x D (cm) 2.121 0.471 N/A A (cm) : rea 0.848 0.141 N/A Volume (cm) : -125.20% 25.00% N/A % Reduction in A rea: -351.10% -11.90% N/A % Reduction in Volume: No No N/A Undermining: Full Thickness Without Exposed Full Thickness Without Exposed N/A Classification: Support Structures Support Structures Medium Medium N/A Exudate  Amount: Serosanguineous Serosanguineous N/A Exudate Type: red, brown red, brown N/A Exudate Color: Distinct, outline attached Distinct, outline attached N/A Wound Margin: Small (1-33%) None Present (0%) N/A Granulation Amount: Pink N/A N/A Granulation Quality: Large (67-100%) Large (67-100%) N/A Necrotic Amount: Eschar, Adherent Slough Eschar N/A Necrotic Tissue: Fat Layer (Subcutaneous Tissue): Yes Fascia: No N/A Exposed Structures: Fascia: No Fat Layer (Subcutaneous Tissue): No Tendon: No Tendon: No Muscle: No Muscle: No Joint: No Joint: No Bone: No Bone: No None None N/A Epithelialization: Chemical/Enzymatic/Mechanical Chemical/Enzymatic/Mechanical N/A Debridement: N/A N/A N/A Pain Control: N/A N/A N/A Tissue Debrided: N/A N/A N/A Level: N/A N/A N/A Debridement A (sq cm): rea N/A N/A N/A Instrument: None None N/A Bleeding: Pressure Pressure N/A Hemostasis A chieved: N/A N/A N/A Procedural Pain: N/A N/A N/A Post Procedural Pain: Debridement Treatment Response: Procedure was tolerated well Procedure was tolerated well N/A Post Debridement Measurements L x 1.8x1.5x0.4 1.8x0.6x0.3 N/A W x D (cm) 0.848 0.254 N/A Post Debridement Volume: (cm) Debridement Debridement N/A Procedures Performed: Treatment Notes Wound #1 (Lower Leg) Wound Laterality: Left, Medial Cleanser Peri-Wound Care Topical Primary Dressing Secondary Dressing Secured With Compression Wrap Compression Stockings Add-Ons Wound #1 (Lower Leg) Wound Laterality: Left, Medial Cleanser Peri-Wound Care Topical Primary Dressing Secondary Dressing Secured With Compression Wrap Compression Stockings  Add-Ons Wound #3 (Lower Leg) Wound Laterality: Left, Posterior Cleanser Soap and Water Discharge Instruction: May shower and wash wound with dial antibacterial soap and water prior to dressing change. Peri-Wound Care Topical Primary Dressing Santyl Ointment Discharge Instruction:  Apply nickel thick amount to wound bed as instructed Secondary Dressing Woven Gauze Sponge, Non-Sterile 4x4 in Discharge Instruction: Apply over primary dressing as directed. Secured With [employer], 4.5x3.1 (in/yd) Discharge Instruction: Secure with Kerlix as directed. Paper Tape, 2x10 (in/yd) Discharge Instruction: Secure dressing with tape as directed. Compression Wrap Compression Stockings Add-Ons Wound #4 (Ankle) Wound Laterality: Left, Lateral Cleanser Soap and Water Discharge Instruction: May shower and wash wound with dial antibacterial soap and water prior to dressing change. Peri-Wound Care Topical Primary Dressing Santyl Ointment Discharge Instruction: Apply nickel thick amount to wound bed as instructed Secondary Dressing Woven Gauze Sponge, Non-Sterile 4x4 in Discharge Instruction: Apply over primary dressing as directed. Secured With [employer], 4.5x3.1 (in/yd) Discharge Instruction: Secure with Kerlix as directed. Paper Tape, 2x10 (in/yd) Discharge Instruction: Secure dressing with tape as directed. Compression Wrap Compression Stockings Add-Ons Wound #5 (Ankle) Wound Laterality: Left, Anterior Cleanser Soap and Water Discharge Instruction: May shower and wash wound with dial antibacterial soap and water prior to dressing change. Peri-Wound Care Topical Primary Dressing Santyl Ointment Discharge Instruction: Apply nickel thick amount to wound bed as instructed Secondary Dressing Woven Gauze Sponge, Non-Sterile 4x4 in Discharge Instruction: Apply over primary dressing as directed. Secured With [employer], 4.5x3.1 (in/yd) Discharge Instruction: Secure with Kerlix as directed. Paper Tape, 2x10 (in/yd) Discharge Instruction: Secure dressing with tape as directed. Compression Wrap Compression Stockings Add-Ons Wound #6 (Lower Leg) Wound Laterality: Left, Posterior, Distal Cleanser Soap and Water Discharge Instruction: May  shower and wash wound with dial antibacterial soap and water prior to dressing change. Peri-Wound Care Topical Primary Dressing Santyl Ointment Discharge Instruction: Apply nickel thick amount to wound bed as instructed Secondary Dressing Woven Gauze Sponge, Non-Sterile 4x4 in Discharge Instruction: Apply over primary dressing as directed. Secured With [employer], 4.5x3.1 (in/yd) Discharge Instruction: Secure with Kerlix as directed. Paper Tape, 2x10 (in/yd) Discharge Instruction: Secure dressing with tape as directed. Compression Wrap Compression Stockings Add-Ons Electronic Signature(s) Signed: 05/09/2021 4:34:34 PM By: Geralyn Corwin DO Signed: 05/09/2021 5:09:46 PM By: Shawn Stall RN, BSN Entered By: Geralyn Corwin on 05/09/2021 16:21:01 -------------------------------------------------------------------------------- Multi-Disciplinary Care Plan Details Patient Name: Date of Service: Kelly Milling RIA H [name]. 05/09/2021 3:15 PM Medical Record Number: [id number] Patient Account Number: [id number] Date of Birth/Sex: Treating RN: 1996-06-21 (25 y.o. Kelly [name], Millard.Loa Primary Care Caitlyn Buchanan: Kelly [name] Other Clinician: Referring Tzirel Leonor: Treating Angelisse Riso/Extender: Gennaro [place] in Treatment: 12 Multidisciplinary Care Plan reviewed with physician Active Inactive Abuse / Safety / Falls / Self Care Management Nursing Diagnoses: History of Falls Potential for falls Goals: Patient/caregiver will verbalize/demonstrate measures taken to prevent injury and/or falls Date Initiated: 04/21/2021 Target Resolution Date: 05/26/2021 Goal Status: Active Interventions: Assess fall risk on admission and as needed Assess impairment of mobility on admission and as needed per policy Notes: Wound/Skin Impairment Nursing Diagnoses: Impaired tissue integrity Goals: Patient/caregiver will verbalize understanding of skin care regimen Date Initiated:  02/13/2021 Target Resolution Date: 05/26/2021 Goal Status: Active Ulcer/skin breakdown will have a volume reduction of 30% by week 4 Date Initiated: 02/13/2021 Date Inactivated: 04/21/2021 Target Resolution Date: 03/13/2021 Unmet Reason: pt did not return to Goal Status: Unmet clinic for F/U Ulcer/skin breakdown will have a volume reduction of 80% by week  12 Date Initiated: 04/21/2021 Target Resolution Date: 06/23/2021 Goal Status: Active Interventions: Assess patient/caregiver ability to obtain necessary supplies Assess patient/caregiver ability to perform ulcer/skin care regimen upon admission and as needed Assess ulceration(s) every visit Provide education on ulcer and skin care Treatment Activities: Skin care regimen initiated : 02/13/2021 Topical wound management initiated : 02/13/2021 Notes: Electronic Signature(s) Signed: 05/09/2021 5:09:46 PM By: Shawn Stall RN, BSN Entered By: Shawn Stall on 05/09/2021 15:59:44 -------------------------------------------------------------------------------- Pain Assessment Details Patient Name: Date of Service: Kelly Milling RIA H [name]. 05/09/2021 3:15 PM Medical Record Number: [id number] Patient Account Number: [id number] Date of Birth/Sex: Treating RN: 1996-07-21 (25 y.o. [name] Primary Care Briawna Carver: Kelly [name] Other Clinician: Referring Kal Chait: Treating Krishna Heuer/Extender: Gennaro [place] in Treatment: 12 Active Problems Location of Pain Severity and Description of Pain Patient Has Paino Yes Site Locations Pain Location: Generalized Pain, Pain in Ulcers Rate the pain. Current Pain Level: 8 Worst Pain Level: 10 Least Pain Level: 0 Tolerable Pain Level: 7 Character of Pain Describe the Pain: Heavy, Sharp Pain Management and Medication Current Pain Management: Medication: No Cold Application: No Rest: No Massage: No Activity: No T.E.N.S.: No Heat Application: No Leg drop or  elevation: No Is the Current Pain Management Adequate: Adequate How does your wound impact your activities of daily livingo Sleep: No Bathing: No Appetite: No Relationship With Others: No Bladder Continence: No Emotions: No Bowel Continence: No Work: No Toileting: No Drive: No Dressing: No Hobbies: No Psychologist, prison and probation services) Signed: 05/09/2021 5:09:46 PM By: Shawn Stall RN, BSN Entered By: Shawn Stall on 05/09/2021 15:27:38 -------------------------------------------------------------------------------- Patient/Caregiver Education Details Patient Name: Date of Service: Babette Relic 9/13/2022andnbsp3:15 PM Medical Record Number: [id number] Patient Account Number: [id number] Date of Birth/Gender: Treating RN: 02-12-1996 (25 y.o. [name] Primary Care Physician: Kelly [name] Other Clinician: Referring Physician: Treating Physician/Extender: Gennaro Africa in Treatment: 12 Education Assessment Education Provided To: Patient Education Topics Provided Wound/Skin Impairment: Handouts: Skin Care Do's and Dont's Methods: Explain/Verbal Responses: Reinforcements needed Electronic Signature(s) Signed: 05/09/2021 5:09:46 PM By: Shawn Stall RN, BSN Entered By: Shawn Stall on 05/09/2021 15:59:57 -------------------------------------------------------------------------------- Wound Assessment Details Patient Name: Date of Service: Kelly Milling RIA H [name]. 05/09/2021 3:15 PM Medical Record Number: [id number] Patient Account Number: [id number] Date of Birth/Sex: Treating RN: 05/23/1996 (25 y.o. Kelly [name], Millard.Loa Primary Care Derotha Fishbaugh: Kelly [name] Other Clinician: Referring Jerlene Rockers: Treating Alleene Stoy/Extender: Gennaro Africa in Treatment: 12 Wound Status Wound Number: 1 Primary Etiology: Lupus Wound Location: Left, Medial Lower Leg Wound Status: Open Wounding Event: Other Lesion Comorbid  History: Asthma, Lupus Erythematosus Date Acquired: 01/17/2021 Weeks Of Treatment: 12 Clustered Wound: No Photos Wound Measurements Length: (cm) Width: (cm) Depth: (cm) Area: (cm) Volume: (cm) 0 % Reduction in Area: 100% 0 % Reduction in Volume: 100% 0 Epithelialization: Large (67-100%) 0 Tunneling: No 0 Undermining: No Wound Description Classification: Full Thickness Without Exposed Support Structures Wound Margin: Well defined, not attached Exudate Amount: None Present Foul Odor After Cleansing: No Slough/Fibrino No Wound Bed Granulation Amount: None Present (0%) Exposed Structure Necrotic Amount: None Present (0%) Fascia Exposed: No Fat Layer (Subcutaneous Tissue) Exposed: No Tendon Exposed: No Muscle Exposed: No Joint Exposed: No Bone Exposed: No Electronic Signature(s) Signed: 05/09/2021 5:09:46 PM By: Shawn Stall RN, BSN Entered By: Shawn Stall on 05/09/2021 15:45:39 -------------------------------------------------------------------------------- Wound Assessment Details Patient Name: Date of Service: Kelly Milling RIA Cherly Anderson. 05/09/2021 3:15 PM Medical Record Number: [id number] Patient Account Number: [id number] Date of  Birth/Sex: Treating RN: 04-Nov-1995 (25 y.o. Kelly [name], Millard.Loa Primary Care Mallika Sanmiguel: Kelly [name] Other Clinician: Referring Konnor Vondrasek: Treating Newel Oien/Extender: Gennaro [place] in Treatment: 12 Wound Status Wound Number: 3 Primary Etiology: Cellulitis Wound Location: Left, Posterior Lower Leg Wound Status: Open Wounding Event: Gradually Appeared Comorbid History: Asthma, Lupus Erythematosus Date Acquired: 04/14/2021 Weeks Of Treatment: 2 Clustered Wound: No Photos Wound Measurements Length: (cm) 1.6 Width: (cm) 1.5 Depth: (cm) 1.5 Area: (cm) 1.885 Volume: (cm) 2.827 % Reduction in Area: -53.9% % Reduction in Volume: -92.3% Tunneling: No Undermining: Yes Starting Position (o'clock): 12 Ending  Position (o'clock): 12 Maximum Distance: (cm) 0.7 Wound Description Classification: Full Thickness With Exposed Support Struct Wound Margin: Distinct, outline attached Exudate Amount: Medium Exudate Type: Serosanguineous Exudate Color: red, brown ures Foul Odor After Cleansing: No Slough/Fibrino Yes Wound Bed Granulation Amount: Large (67-100%) Exposed Structure Granulation Quality: Red, Pink Fascia Exposed: No Necrotic Amount: Small (1-33%) Fat Layer (Subcutaneous Tissue) Exposed: Yes Necrotic Quality: Adherent Slough Tendon Exposed: No Muscle Exposed: No Joint Exposed: No Bone Exposed: No Treatment Notes Wound #3 (Lower Leg) Wound Laterality: Left, Posterior Cleanser Soap and Water Discharge Instruction: May shower and wash wound with dial antibacterial soap and water prior to dressing change. Peri-Wound Care Topical Primary Dressing Santyl Ointment Discharge Instruction: Apply nickel thick amount to wound bed as instructed Secondary Dressing Woven Gauze Sponge, Non-Sterile 4x4 in Discharge Instruction: Apply over primary dressing as directed. Secured With [employer], 4.5x3.1 (in/yd) Discharge Instruction: Secure with Kerlix as directed. Paper Tape, 2x10 (in/yd) Discharge Instruction: Secure dressing with tape as directed. Compression Wrap Compression Stockings Add-Ons Electronic Signature(s) Signed: 05/09/2021 5:09:46 PM By: Shawn Stall RN, BSN Entered By: Shawn Stall on 05/09/2021 15:47:54 -------------------------------------------------------------------------------- Wound Assessment Details Patient Name: Date of Service: Kelly Milling RIA H [name]. 05/09/2021 3:15 PM Medical Record Number: [id number] Patient Account Number: [id number] Date of Birth/Sex: Treating RN: 10-Dec-1995 (25 y.o. Kelly [name], Millard.Loa Primary Care Armanie Ullmer: Kelly [name] Other Clinician: Referring Deveon Kisiel: Treating Edy Mcbane/Extender: Gennaro [place] in  Treatment: 12 Wound Status Wound Number: 4 Primary Etiology: Cellulitis Wound Location: Left, Lateral Ankle Secondary Etiology: Lupus Wounding Event: Gradually Appeared Wound Status: Open Date Acquired: 04/14/2021 Comorbid History: Asthma, Lupus Erythematosus Weeks Of Treatment: 2 Clustered Wound: No Photos Wound Measurements Length: (cm) 5.2 Width: (cm) 5.5 Depth: (cm) 0.6 Area: (cm) 22.462 Volume: (cm) 13.477 % Reduction in Area: -43% % Reduction in Volume: -114.5% Epithelialization: None Tunneling: No Undermining: Yes Starting Position (o'clock): 6 Ending Position (o'clock): 12 Maximum Distance: (cm) 0.3 Wound Description Classification: Full Thickness Without Exposed Support Structures Wound Margin: Distinct, outline attached Exudate Amount: Medium Exudate Type: Serosanguineous Exudate Color: red, brown Foul Odor After Cleansing: No Slough/Fibrino Yes Wound Bed Granulation Amount: Small (1-33%) Exposed Structure Granulation Quality: Red, Pink Fascia Exposed: No Necrotic Amount: Large (67-100%) Fat Layer (Subcutaneous Tissue) Exposed: Yes Necrotic Quality: Eschar, Adherent Slough Tendon Exposed: No Muscle Exposed: No Joint Exposed: No Bone Exposed: No Treatment Notes Wound #4 (Ankle) Wound Laterality: Left, Lateral Cleanser Soap and Water Discharge Instruction: May shower and wash wound with dial antibacterial soap and water prior to dressing change. Peri-Wound Care Topical Primary Dressing Santyl Ointment Discharge Instruction: Apply nickel thick amount to wound bed as instructed Secondary Dressing Woven Gauze Sponge, Non-Sterile 4x4 in Discharge Instruction: Apply over primary dressing as directed. Secured With [employer], 4.5x3.1 (in/yd) Discharge Instruction: Secure with Kerlix as directed. Paper Tape, 2x10 (in/yd) Discharge Instruction: Secure dressing with tape as directed. Compression  Wrap Compression Stockings Geologist, engineering) Signed: 05/09/2021 5:09:46 PM By: Shawn Stall RN, BSN Entered By: Shawn Stall on 05/09/2021 15:46:39 -------------------------------------------------------------------------------- Wound Assessment Details Patient Name: Date of Service: Kelly Milling RIA H [name]. 05/09/2021 3:15 PM Medical Record Number: [id number] Patient Account Number: [id number] Date of Birth/Sex: Treating RN: 01-Apr-1996 (25 y.o. Kelly [name], Millard.Loa Primary Care Abdinasir Spadafore: Kelly [name] Other Clinician: Referring Amritpal Shropshire: Treating Dewain Platz/Extender: Gennaro [place] in Treatment: 12 Wound Status Wound Number: 5 Primary Etiology: Cellulitis Wound Location: Left, Anterior Ankle Secondary Etiology: Lupus Wounding Event: Gradually Appeared Wound Status: Open Date Acquired: 04/14/2021 Comorbid History: Asthma, Lupus Erythematosus Weeks Of Treatment: 2 Clustered Wound: No Photos Wound Measurements Length: (cm) 1.8 Width: (cm) 1.5 Depth: (cm) 0.4 Area: (cm) 2.121 Volume: (cm) 0.848 % Reduction in Area: -125.2% % Reduction in Volume: -351.1% Epithelialization: None Tunneling: No Undermining: No Wound Description Classification: Full Thickness Without Exposed Support Structures Wound Margin: Distinct, outline attached Exudate Amount: Medium Exudate Type: Serosanguineous Exudate Color: red, brown Foul Odor After Cleansing: No Slough/Fibrino Yes Wound Bed Granulation Amount: Small (1-33%) Exposed Structure Granulation Quality: Pink Fascia Exposed: No Necrotic Amount: Large (67-100%) Fat Layer (Subcutaneous Tissue) Exposed: Yes Necrotic Quality: Eschar, Adherent Slough Tendon Exposed: No Muscle Exposed: No Joint Exposed: No Bone Exposed: No Treatment Notes Wound #5 (Ankle) Wound Laterality: Left, Anterior Cleanser Soap and Water Discharge Instruction: May shower and wash wound with dial antibacterial soap and water prior to dressing change. Peri-Wound  Care Topical Primary Dressing Santyl Ointment Discharge Instruction: Apply nickel thick amount to wound bed as instructed Secondary Dressing Woven Gauze Sponge, Non-Sterile 4x4 in Discharge Instruction: Apply over primary dressing as directed. Secured With [employer], 4.5x3.1 (in/yd) Discharge Instruction: Secure with Kerlix as directed. Paper Tape, 2x10 (in/yd) Discharge Instruction: Secure dressing with tape as directed. Compression Wrap Compression Stockings Add-Ons Electronic Signature(s) Signed: 05/09/2021 5:09:46 PM By: Shawn Stall RN, BSN Entered By: Shawn Stall on 05/09/2021 15:47:09 -------------------------------------------------------------------------------- Wound Assessment Details Patient Name: Date of Service: Kelly Milling RIA H [name]. 05/09/2021 3:15 PM Medical Record Number: [id number] Patient Account Number: [id number] Date of Birth/Sex: Treating RN: 01/30/1996 (25 y.o. Kelly [name], Millard.Loa Primary Care Azaylia Fong: Kelly [name] Other Clinician: Referring Damia Bobrowski: Treating Aynslee Mulhall/Extender: Gennaro Africa in Treatment: 12 Wound Status Wound Number: 6 Primary Etiology: Lupus Wound Location: Left, Distal, Posterior Lower Leg Wound Status: Open Wounding Event: Gradually Appeared Comorbid History: Asthma, Lupus Erythematosus Date Acquired: 04/14/2021 Weeks Of Treatment: 2 Clustered Wound: No Photos Wound Measurements Length: (cm) 1 Width: (cm) 0.6 Depth: (cm) 0.3 Area: (cm) 0.471 Volume: (cm) 0.141 % Reduction in Area: 25% % Reduction in Volume: -11.9% Epithelialization: None Tunneling: No Undermining: No Wound Description Classification: Full Thickness Without Exposed Support Structu Wound Margin: Distinct, outline attached Exudate Amount: Medium Exudate Type: Serosanguineous Exudate Color: red, brown res Foul Odor After Cleansing: No Slough/Fibrino Yes Wound Bed Granulation Amount: None Present (0%)  Exposed Structure Necrotic Amount: Large (67-100%) Fascia Exposed: No Necrotic Quality: Eschar Fat Layer (Subcutaneous Tissue) Exposed: No Tendon Exposed: No Muscle Exposed: No Joint Exposed: No Bone Exposed: No Treatment Notes Wound #6 (Lower Leg) Wound Laterality: Left, Posterior, Distal Cleanser Soap and Water Discharge Instruction: May shower and wash wound with dial antibacterial soap and water prior to dressing change. Peri-Wound Care Topical Primary Dressing Santyl Ointment Discharge Instruction: Apply nickel thick amount to wound bed as instructed Secondary Dressing Woven Gauze Sponge, Non-Sterile 4x4 in Discharge Instruction: Apply over primary dressing as directed. Secured With  [employer] Roll Sterile, 4.5x3.1 (in/yd) Discharge Instruction: Secure with Kerlix as directed. Paper Tape, 2x10 (in/yd) Discharge Instruction: Secure dressing with tape as directed. Compression Wrap Compression Stockings Add-Ons Electronic Signature(s) Signed: 05/09/2021 5:09:46 PM By: Shawn Stall RN, BSN Entered By: Shawn Stall on 05/09/2021 15:46:12 -------------------------------------------------------------------------------- Vitals Details Patient Name: Date of Service: Kelly Milling RIA H [name]. 05/09/2021 3:15 PM Medical Record Number: [id number] Patient Account Number: [id number] Date of Birth/Sex: Treating RN: 1996-02-04 (25 y.o. Kelly [name], Millard.Loa Primary Care Timmothy Baranowski: Kelly [name] Other Clinician: Referring Terree Gaultney: Treating Davarion Cuffee/Extender: Gennaro Africa in Treatment: 12 Vital Signs Time Taken: 15:27 Temperature (F): 99.4 Height (in): 62 Pulse (bpm): 83 Respiratory Rate (breaths/min): 20 Blood Pressure (mmHg): 123/80 Reference Range: 80 - 120 mg / dl Electronic Signature(s) Signed: 05/09/2021 5:09:46 PM By: Shawn Stall RN, BSN Entered By: Shawn Stall on 05/09/2021 15:27:20

## 2021-05-11 DIAGNOSIS — I872 Venous insufficiency (chronic) (peripheral): Secondary | ICD-10-CM | POA: Diagnosis not present

## 2021-05-11 DIAGNOSIS — M329 Systemic lupus erythematosus, unspecified: Secondary | ICD-10-CM | POA: Diagnosis not present

## 2021-05-11 DIAGNOSIS — L98492 Non-pressure chronic ulcer of skin of other sites with fat layer exposed: Secondary | ICD-10-CM | POA: Diagnosis not present

## 2021-05-16 DIAGNOSIS — S86011A Strain of right Achilles tendon, initial encounter: Secondary | ICD-10-CM | POA: Diagnosis not present

## 2021-05-18 ENCOUNTER — Other Ambulatory Visit: Payer: Self-pay

## 2021-05-18 ENCOUNTER — Encounter (HOSPITAL_BASED_OUTPATIENT_CLINIC_OR_DEPARTMENT_OTHER): Payer: BC Managed Care – PPO | Attending: Internal Medicine | Admitting: Internal Medicine

## 2021-05-18 DIAGNOSIS — S81802D Unspecified open wound, left lower leg, subsequent encounter: Secondary | ICD-10-CM

## 2021-05-18 DIAGNOSIS — J45909 Unspecified asthma, uncomplicated: Secondary | ICD-10-CM | POA: Diagnosis not present

## 2021-05-18 DIAGNOSIS — X58XXXD Exposure to other specified factors, subsequent encounter: Secondary | ICD-10-CM | POA: Diagnosis not present

## 2021-05-18 DIAGNOSIS — M329 Systemic lupus erythematosus, unspecified: Secondary | ICD-10-CM | POA: Insufficient documentation

## 2021-05-18 DIAGNOSIS — S91002D Unspecified open wound, left ankle, subsequent encounter: Secondary | ICD-10-CM | POA: Diagnosis not present

## 2021-05-18 NOTE — Progress Notes (Signed)
Kelly [name], [name] ([id number]) Visit Report for 05/18/2021 Chief Complaint Document Details Patient Name: Date of Service: Kelly [name] 05/18/2021 3:15 PM Medical Record Number: [id number] Patient Account Number: [id number] Date of Birth/Sex: Treating RN: [birth year] (25 y.o. Kelly [name] Primary Care Provider: Dione Booze Other Clinician: Referring Provider: Treating Provider/Extender: Gennaro Africa in Treatment: 13 Information Obtained from: Patient Chief Complaint Bilateral lower extremity wounds 04/21/21: left lower extremity wound s/p cellulitis Electronic Signature(s) Signed: 05/18/2021 4:24:13 PM By: Geralyn Corwin DO Entered By: Geralyn Corwin on 05/18/2021 16:18:46 -------------------------------------------------------------------------------- Debridement Details Patient Name: Date of Service: Kelly Milling [name] 05/18/2021 3:15 PM Medical Record Number: [id number] Patient Account Number: [id number] Date of Birth/Sex: Treating RN: 08/14/1996 (25 y.o. Kelly [name] Primary Care Provider: Dione Booze Other Clinician: Referring Provider: Treating Provider/Extender: Gennaro Africa in Treatment: 13 Debridement Performed for Assessment: Wound #4 Left,Lateral Ankle Performed By: Physician Geralyn Corwin, DO Debridement Type: Debridement Level of Consciousness (Pre-procedure): Awake and Alert Pre-procedure Verification/Time Out Yes - 15:59 Taken: Start Time: 16:00 Pain Control: Other : benzocaine 20% T Area Debrided (L x W): otal 3 (cm) x 3 (cm) = 9 (cm) Tissue and other material debrided: Non-Viable, Slough, Slough Level: Non-Viable Tissue Debridement Description: Selective/Open Wound Instrument: Blade, Forceps Bleeding: Minimum Hemostasis Achieved: Pressure Response to Treatment: Procedure was tolerated well Level of Consciousness (Post- Awake and Alert procedure): Post Debridement  Measurements of Total Wound Length: (cm) 4.2 Width: (cm) 5.6 Depth: (cm) 0.3 Volume: (cm) 5.542 Character of Wound/Ulcer Post Debridement: Stable Post Procedure Diagnosis Same as Pre-procedure Electronic Signature(s) Signed: 05/18/2021 4:24:13 PM By: Geralyn Corwin DO Signed: 05/18/2021 4:57:50 PM By: Antonieta Iba Entered By: Antonieta Iba on 05/18/2021 16:06:06 -------------------------------------------------------------------------------- Debridement Details Patient Name: Date of Service: Kelly Milling [name] 05/18/2021 3:15 PM Medical Record Number: [id number] Patient Account Number: [id number] Date of Birth/Sex: Treating RN: [birth year] (25 y.o. Kelly [name] Primary Care Provider: Dione Booze Other Clinician: Referring Provider: Treating Provider/Extender: Gennaro Africa in Treatment: 13 Debridement Performed for Assessment: Wound #7 Left,Anterior Lower Leg Performed By: Physician Geralyn Corwin, DO Debridement Type: Debridement Level of Consciousness (Pre-procedure): Awake and Alert Pre-procedure Verification/Time Out Yes - 15:59 Taken: Start Time: 16:07 Pain Control: Other : benzocaine 20% T Area Debrided (L x W): otal 0.4 (cm) x 0.5 (cm) = 0.2 (cm) Tissue and other material debrided: Non-Viable, Slough, Slough Level: Non-Viable Tissue Debridement Description: Selective/Open Wound Instrument: Blade, Forceps Bleeding: Minimum Hemostasis Achieved: Pressure End Time: 16:11 Response to Treatment: Procedure was tolerated well Level of Consciousness (Post- Awake and Alert procedure): Post Debridement Measurements of Total Wound Length: (cm) 1.3 Width: (cm) 1.2 Depth: (cm) 0.3 Volume: (cm) 0.368 Character of Wound/Ulcer Post Debridement: Stable Post Procedure Diagnosis Same as Pre-procedure Electronic Signature(s) Signed: 05/18/2021 4:24:13 PM By: Geralyn Corwin DO Signed: 05/18/2021 4:57:50 PM By: Antonieta Iba Entered By: Antonieta Iba on 05/18/2021 16:16:50 -------------------------------------------------------------------------------- HPI Details Patient Name: Date of Service: Kelly Milling [name] 05/18/2021 3:15 PM Medical Record Number: [id number] Patient Account Number: [id number] Date of Birth/Sex: Treating RN: [birth year] (25 y.o. Kelly [name] Primary Care Provider: Dione Booze Other Clinician: Referring Provider: Treating Provider/Extender: Gennaro Africa in Treatment: 13 History of Present Illness HPI Description: Admission 6/21 Kelly [name] is A 25 year old female with a past medical history of systemic lupus erythematosus that presents with bilateral lower extremity wounds that started in April 2022. She has had skin issues  in the past where she developed very small wounds but these healed with time. She has never had open wounds like she does on her legs. She currently reports minimal drainage to the wounds. She does have pain to these areas but has been overall stable. She denies signs of infection. She has visited the ED for this issue and was recently prescribed doxycycline for possible cellulitis. She follows with [facility] for her SLE and is currently on Plaquenil, prednisone and CellCept. 6/27; patient presents for 1 week follow-up. She states she saw her rheumatologist on 6/22 and prednisone was increased from 10 mg to 20 mg daily. She is currently at the highest dose of Plaquenil and CellCept and is going to try an infusion later today at her regimen. She reports improvement to the wounds. She has been using collagen with dressing changes. She currently denies signs of infection. Readmission 8/26 Patient presents to clinic today for open wounds to her left lower extremity. Her previous wounds that were treated have closed. She states that on 8/9 she developed cellulitis and was treated with 2 rounds of antibiotics. Her  cellulitis has resolved. She had blisters that opened and are now large wounds. She has seen her dermatologist and rheumatologist since she has systemic lupus and a hard time healing. No changes have been made to her medications. She was recommended antibiotic ointment by her dermatologist to her wounds She picked up the antibiotic ointment today.. She reports pain to these areas. She denies purulent drainage, increased warmth or erythema to the left lower extremity. She currently keeps the areas covered. Of note she had an Achilles tendon rupture on her right leg and is currently in a soft cast. 9/13; patient presents for follow-up. She missed her last clinic appointment. She has been using Santyl to the wound beds daily. She has no issues or complaints today. She reports the Santyl is helping clean up the wounds. She currently denies signs of infection. 9/22; patient states that 1 week ago she was seen by dermatology and they placed an Unna boot with Bactroban on her left lower extremity. Today she presents with a Unna boot in place. She currently denies signs of infection. Electronic Signature(s) Signed: 05/18/2021 4:24:13 PM By: Geralyn Corwin DO Entered By: Geralyn Corwin on 05/18/2021 16:19:51 -------------------------------------------------------------------------------- Physical Exam Details Patient Name: Date of Service: Kelly Milling RIA Cherly Anderson. 05/18/2021 3:15 PM Medical Record Number: [id number] Patient Account Number: [id number] Date of Birth/Sex: Treating RN: 12-07-1995 (25 y.o. Kelly [name] Primary Care Provider: Dione Booze Other Clinician: Referring Provider: Treating Provider/Extender: Gennaro Africa in Treatment: 13 Constitutional respirations regular, non-labored and within target range for patient.. Cardiovascular 2+ dorsalis pedis/posterior tibialis pulses. Psychiatric pleasant and cooperative. Notes Left lower extremity: 5 wounds  present throughout. 4 out of the 5 have slough throughout. The posterior wound has granulation tissue. No obvious signs of infection. Electronic Signature(s) Signed: 05/18/2021 4:24:13 PM By: Geralyn Corwin DO Entered By: Geralyn Corwin on 05/18/2021 16:20:53 -------------------------------------------------------------------------------- Physician Orders Details Patient Name: Date of Service: Kelly Milling [name] 05/18/2021 3:15 PM Medical Record Number: [id number] Patient Account Number: [id number] Date of Birth/Sex: Treating RN: 10/10/1995 (25 y.o. Kelly [name] Primary Care Provider: Other Clinician: Dione Booze Referring Provider: Treating Provider/Extender: Gennaro Africa in Treatment: 13 Verbal / Phone Orders: No Diagnosis Coding ICD-10 Coding Code Description S81.802D Unspecified open wound, left lower leg, subsequent encounter S91.002D Unspecified open wound, left ankle, subsequent encounter M32.9 Systemic lupus  erythematosus, unspecified Follow-up Appointments ppointment in 2 weeks. - Dr. Mikey Bussing Return A Bathing/ Shower/ Hygiene May shower and wash wound with soap and water. Edema Control - Lymphedema / SCD / Other Elevate legs to the level of the heart or above for 30 minutes daily and/or when sitting, a frequency of: - 3-4 times a day throughout the day. Avoid standing for long periods of time. Additional Orders / Instructions Follow Nutritious Diet Wound Treatment Wound #3 - Lower Leg Wound Laterality: Left, Posterior Cleanser: Soap and Water 1 x Per Day/30 Days Discharge Instructions: May shower and wash wound with dial antibacterial soap and water prior to dressing change. Prim Dressing: Promogran Prisma Matrix, 4.34 (sq in) (silver collagen) 1 x Per Day/30 Days ary Discharge Instructions: Moisten collagen with saline or hydrogel Secondary Dressing: Woven Gauze Sponge, Non-Sterile 4x4 in (Generic) 1 x Per Day/30  Days Discharge Instructions: Apply over primary dressing as directed. Secured With: [employer], 4.5x3.1 (in/yd) (Generic) 1 x Per Day/30 Days Discharge Instructions: Secure with Kerlix as directed. Secured With: Paper Tape, 2x10 (in/yd) (Generic) 1 x Per Day/30 Days Discharge Instructions: Secure dressing with tape as directed. Wound #4 - Ankle Wound Laterality: Left, Lateral Cleanser: Soap and Water 1 x Per Day/30 Days Discharge Instructions: May shower and wash wound with dial antibacterial soap and water prior to dressing change. Prim Dressing: Santyl Ointment 1 x Per Day/30 Days ary Discharge Instructions: Apply nickel thick amount to wound bed as instructed Secondary Dressing: Woven Gauze Sponge, Non-Sterile 4x4 in (Generic) 1 x Per Day/30 Days Discharge Instructions: Apply over primary dressing as directed. Secured With: [employer], 4.5x3.1 (in/yd) (Generic) 1 x Per Day/30 Days Discharge Instructions: Secure with Kerlix as directed. Secured With: Paper Tape, 2x10 (in/yd) (Generic) 1 x Per Day/30 Days Discharge Instructions: Secure dressing with tape as directed. Wound #5 - Ankle Wound Laterality: Left, Anterior Cleanser: Soap and Water 1 x Per Day/30 Days Discharge Instructions: May shower and wash wound with dial antibacterial soap and water prior to dressing change. Prim Dressing: Santyl Ointment 1 x Per Day/30 Days ary Discharge Instructions: Apply nickel thick amount to wound bed as instructed Secondary Dressing: Woven Gauze Sponge, Non-Sterile 4x4 in (Generic) 1 x Per Day/30 Days Discharge Instructions: Apply over primary dressing as directed. Secured With: [employer], 4.5x3.1 (in/yd) (Generic) 1 x Per Day/30 Days Discharge Instructions: Secure with Kerlix as directed. Secured With: Paper Tape, 2x10 (in/yd) (Generic) 1 x Per Day/30 Days Discharge Instructions: Secure dressing with tape as directed. Wound #6 - Lower Leg Wound Laterality: Left,  Posterior, Distal Cleanser: Soap and Water 1 x Per Day/30 Days Discharge Instructions: May shower and wash wound with dial antibacterial soap and water prior to dressing change. Prim Dressing: Santyl Ointment 1 x Per Day/30 Days ary Discharge Instructions: Apply nickel thick amount to wound bed as instructed Secondary Dressing: Woven Gauze Sponge, Non-Sterile 4x4 in (Generic) 1 x Per Day/30 Days Discharge Instructions: Apply over primary dressing as directed. Secured With: [employer], 4.5x3.1 (in/yd) (Generic) 1 x Per Day/30 Days Discharge Instructions: Secure with Kerlix as directed. Secured With: Paper Tape, 2x10 (in/yd) (Generic) 1 x Per Day/30 Days Discharge Instructions: Secure dressing with tape as directed. Wound #7 - Lower Leg Wound Laterality: Left, Anterior Cleanser: Soap and Water 1 x Per Day/30 Days Discharge Instructions: May shower and wash wound with dial antibacterial soap and water prior to dressing change. Prim Dressing: Santyl Ointment 1 x Per Day/30 Days ary Discharge Instructions:  Apply nickel thick amount to wound bed as instructed Secondary Dressing: Woven Gauze Sponge, Non-Sterile 4x4 in (Generic) 1 x Per Day/30 Days Discharge Instructions: Apply over primary dressing as directed. Secured With: [employer], 4.5x3.1 (in/yd) (Generic) 1 x Per Day/30 Days Discharge Instructions: Secure with Kerlix as directed. Secured With: Paper Tape, 2x10 (in/yd) (Generic) 1 x Per Day/30 Days Discharge Instructions: Secure dressing with tape as directed. Electronic Signature(s) Signed: 05/18/2021 4:24:13 PM By: Geralyn Corwin DO Entered By: Geralyn Corwin on 05/18/2021 16:21:12 -------------------------------------------------------------------------------- Problem List Details Patient Name: Date of Service: Kelly Milling [name] 05/18/2021 3:15 PM Medical Record Number: [id number] Patient Account Number: [id number] Date of Birth/Sex: Treating RN: 1995/10/14  (25 y.o. Kelly [name] Primary Care Provider: Dione Booze Other Clinician: Referring Provider: Treating Provider/Extender: Gennaro Africa in Treatment: 13 Active Problems ICD-10 Encounter Code Description Active Date MDM Diagnosis S81.802D Unspecified open wound, left lower leg, subsequent encounter 05/09/2021 No Yes S91.002D Unspecified open wound, left ankle, subsequent encounter 05/09/2021 No Yes M32.9 Systemic lupus erythematosus, unspecified 02/14/2021 No Yes Inactive Problems ICD-10 Code Description Active Date Inactive Date L97.819 Non-pressure chronic ulcer of other part of right lower leg with unspecified severity 02/13/2021 02/13/2021 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 with unspecified severity 02/13/2021 02/13/2021 K44.010U Unspecified open wound, left lower leg, initial encounter 04/21/2021 04/21/2021 S91.002A Unspecified open wound, left ankle, initial encounter 04/21/2021 04/21/2021 Resolved Problems Electronic Signature(s) Signed: 05/18/2021 4:24:13 PM By: Geralyn Corwin DO Entered By: Geralyn Corwin on 05/18/2021 16:18:25 -------------------------------------------------------------------------------- Progress Note Details Patient Name: Date of Service: Kelly Milling [name] 05/18/2021 3:15 PM Medical Record Number: [id number] Patient Account Number: [id number] Date of Birth/Sex: Treating RN: [birth year] (25 y.o. Kelly [name] Primary Care Provider: Dione Booze Other Clinician: Referring Provider: Treating Provider/Extender: Gennaro Africa in Treatment: 13 Subjective Chief Complaint Information obtained from Patient Bilateral lower extremity wounds 04/21/21: left lower extremity wound s/p cellulitis History of Present Illness (HPI) Admission 6/21 Ms. Illyana Schorsch is A 25 year old female with a past medical history of systemic lupus erythematosus that presents with  bilateral lower extremity wounds that started in April 2022. She has had skin issues in the past where she developed very small wounds but these healed with time. She has never had open wounds like she does on her legs. She currently reports minimal drainage to the wounds. She does have pain to these areas but has been overall stable. She denies signs of infection. She has visited the ED for this issue and was recently prescribed doxycycline for possible cellulitis. She follows with [facility] for her SLE and is currently on Plaquenil, prednisone and CellCept. 6/27; patient presents for 1 week follow-up. She states she saw her rheumatologist on 6/22 and prednisone was increased from 10 mg to 20 mg daily. She is currently at the highest dose of Plaquenil and CellCept and is going to try an infusion later today at her regimen. She reports improvement to the wounds. She has been using collagen with dressing changes. She currently denies signs of infection. Readmission 8/26 Patient presents to clinic today for open wounds to her left lower extremity. Her previous wounds that were treated have closed. She states that on 8/9 she developed cellulitis and was treated with 2 rounds of antibiotics. Her cellulitis has resolved. She had blisters that opened and are now large wounds. She has seen her dermatologist and rheumatologist since she has systemic lupus and a hard  time healing. No changes have been made to her medications. She was recommended antibiotic ointment by her dermatologist to her wounds She picked up the antibiotic ointment today.. She reports pain to these areas. She denies purulent drainage, increased warmth or erythema to the left lower extremity. She currently keeps the areas covered. Of note she had an Achilles tendon rupture on her right leg and is currently in a soft cast. 9/13; patient presents for follow-up. She missed her last clinic appointment. She has been using Santyl to the  wound beds daily. She has no issues or complaints today. She reports the Santyl is helping clean up the wounds. She currently denies signs of infection. 9/22; patient states that 1 week ago she was seen by dermatology and they placed an Unna boot with Bactroban on her left lower extremity. Today she presents with a Unna boot in place. She currently denies signs of infection. Patient History Information obtained from Patient. Family History Diabetes - Paternal Grandparents, Hypertension - Paternal Grandparents, Thyroid Problems - Maternal Grandparents,Paternal Grandparents, No family history of Cancer, Heart Disease, Hereditary Spherocytosis, Kidney Disease, Lung Disease, Seizures, Stroke, Tuberculosis. Social History Never smoker, Marital Status - Married, Alcohol Use - Moderate, Drug Use - Current History - Marijuana, Caffeine Use - Daily - Coffee. Medical History Respiratory Patient has history of Asthma Immunological Patient has history of Lupus Erythematosus Objective Constitutional respirations regular, non-labored and within target range for patient.. Vitals Time Taken: 3:35 PM, Height: 62 in, Temperature: 98.9 F, Pulse: 98 bpm, Respiratory Rate: 16 breaths/min, Blood Pressure: 130/91 mmHg. Cardiovascular 2+ dorsalis pedis/posterior tibialis pulses. Psychiatric pleasant and cooperative. General Notes: Left lower extremity: 5 wounds present throughout. 4 out of the 5 have slough throughout. The posterior wound has granulation tissue. No obvious signs of infection. Integumentary (Hair, Skin) Wound #3 status is Open. Original cause of wound was Gradually Appeared. The date acquired was: 04/14/2021. The wound has been in treatment 3 weeks. The wound is located on the Left,Posterior Lower Leg. The wound measures 1cm length x 1.8cm width x 0.5cm depth; 1.414cm^2 area and 0.707cm^3 volume. There is Fat Layer (Subcutaneous Tissue) exposed. There is undermining starting at 7:00 and ending  at 10:00 with a maximum distance of 0.8cm. There is a medium amount of serosanguineous drainage noted. The wound margin is distinct with the outline attached to the wound base. There is large (67-100%) red, pink granulation within the wound bed. There is a small (1-33%) amount of necrotic tissue within the wound bed including Adherent Slough. Wound #4 status is Open. Original cause of wound was Gradually Appeared. The date acquired was: 04/14/2021. The wound has been in treatment 3 weeks. The wound is located on the Left,Lateral Ankle. The wound measures 4.2cm length x 5.6cm width x 0.3cm depth; 18.473cm^2 area and 5.542cm^3 volume. There is Fat Layer (Subcutaneous Tissue) exposed. There is no tunneling or undermining noted. There is a medium amount of serosanguineous drainage noted. The wound margin is distinct with the outline attached to the wound base. There is small (1-33%) red, pink granulation within the wound bed. There is a large (67- 100%) amount of necrotic tissue within the wound bed including Eschar and Adherent Slough. Wound #5 status is Open. Original cause of wound was Gradually Appeared. The date acquired was: 04/14/2021. The wound has been in treatment 3 weeks. The wound is located on the Left,Anterior Ankle. The wound measures 2cm length x 1cm width x 0.3cm depth; 1.571cm^2 area and 0.471cm^3 volume. There is Fat Layer (  Subcutaneous Tissue) exposed. There is no tunneling or undermining noted. There is a medium amount of serosanguineous drainage noted. The wound margin is distinct with the outline attached to the wound base. There is no granulation within the wound bed. There is a large (67-100%) amount of necrotic tissue within the wound bed including Eschar and Adherent Slough. Wound #6 status is Open. Original cause of wound was Gradually Appeared. The date acquired was: 04/14/2021. The wound has been in treatment 3 weeks. The wound is located on the Left,Distal,Posterior Lower Leg.  The wound measures 0.7cm length x 0.5cm width x 0.4cm depth; 0.275cm^2 area and 0.11cm^3 volume. There is no tunneling or undermining noted. There is a medium amount of serosanguineous drainage noted. The wound margin is distinct with the outline attached to the wound base. There is small (1-33%) red granulation within the wound bed. There is a large (67-100%) amount of necrotic tissue within the wound bed including Eschar. Wound #7 status is Open. Original cause of wound was Other Lesion. The date acquired was: 05/18/2021. The wound is located on the Left,Anterior Lower Leg. The wound measures 1.3cm length x 1.2cm width x 0.3cm depth; 1.225cm^2 area and 0.368cm^3 volume. There is Fat Layer (Subcutaneous Tissue) exposed. There is no tunneling or undermining noted. There is a medium amount of purulent drainage noted. The wound margin is well defined and not attached to the wound base. There is small (1-33%) red granulation within the wound bed. There is a large (67-100%) amount of necrotic tissue within the wound bed including Adherent Slough. Assessment Active Problems ICD-10 Unspecified open wound, left lower leg, subsequent encounter Unspecified open wound, left ankle, subsequent encounter Systemic lupus erythematosus, unspecified Patient was placed in an Unna boot by dermatology 1 week ago. This was taken off in office. She has developed a new wound to the anterior shin with slough throughout. I think at this time she does not benefit from compression therapy and we will hold off on rewrapping her leg. I debrided nonviable tissue. No signs of infection on exam. I recommended she continue using Santyl to the areas of slough. She has granulation tissue to the posterior leg wound and I recommended using collagen to this. Procedures Wound #4 Pre-procedure diagnosis of Wound #4 is a Cellulitis located on the Left,Lateral Ankle . There was a Selective/Open Wound Non-Viable Tissue Debridement with a  total area of 9 sq cm performed by Geralyn Corwin, DO. With the following instrument(s): Blade, and Forceps to remove Non-Viable tissue/material. Material removed includes Slough after achieving pain control using Other (benzocaine 20%). No specimens were taken. A time out was conducted at 15:59, prior to the start of the procedure. A Minimum amount of bleeding was controlled with Pressure. The procedure was tolerated well. Post Debridement Measurements: 4.2cm length x 5.6cm width x 0.3cm depth; 5.542cm^3 volume. Character of Wound/Ulcer Post Debridement is stable. Post procedure Diagnosis Wound #4: Same as Pre-Procedure Wound #7 Pre-procedure diagnosis of Wound #7 is a Lupus located on the Left,Anterior Lower Leg . There was a Selective/Open Wound Non-Viable Tissue Debridement with a total area of 0.2 sq cm performed by Geralyn Corwin, DO. With the following instrument(s): Blade, and Forceps to remove Non-Viable tissue/material. Material removed includes Slough after achieving pain control using Other (benzocaine 20%). No specimens were taken. A time out was conducted at 15:59, prior to the start of the procedure. A Minimum amount of bleeding was controlled with Pressure. The procedure was tolerated well. Post Debridement Measurements: 1.3cm length x  1.2cm width x 0.3cm depth; 0.368cm^3 volume. Character of Wound/Ulcer Post Debridement is stable. Post procedure Diagnosis Wound #7: Same as Pre-Procedure Plan Follow-up Appointments: Return Appointment in 2 weeks. - Dr. Mikey Bussing Bathing/ Shower/ Hygiene: May shower and wash wound with soap and water. Edema Control - Lymphedema / SCD / Other: Elevate legs to the level of the heart or above for 30 minutes daily and/or when sitting, a frequency of: - 3-4 times a day throughout the day. Avoid standing for long periods of time. Additional Orders / Instructions: Follow Nutritious Diet WOUND #3: - Lower Leg Wound Laterality: Left,  Posterior Cleanser: Soap and Water 1 x Per Day/30 Days Discharge Instructions: May shower and wash wound with dial antibacterial soap and water prior to dressing change. Prim Dressing: Promogran Prisma Matrix, 4.34 (sq in) (silver collagen) 1 x Per Day/30 Days ary Discharge Instructions: Moisten collagen with saline or hydrogel Secondary Dressing: Woven Gauze Sponge, Non-Sterile 4x4 in (Generic) 1 x Per Day/30 Days Discharge Instructions: Apply over primary dressing as directed. Secured With: [employer], 4.5x3.1 (in/yd) (Generic) 1 x Per Day/30 Days Discharge Instructions: Secure with Kerlix as directed. Secured With: Paper T ape, 2x10 (in/yd) (Generic) 1 x Per Day/30 Days Discharge Instructions: Secure dressing with tape as directed. WOUND #4: - Ankle Wound Laterality: Left, Lateral Cleanser: Soap and Water 1 x Per Day/30 Days Discharge Instructions: May shower and wash wound with dial antibacterial soap and water prior to dressing change. Prim Dressing: Santyl Ointment 1 x Per Day/30 Days ary Discharge Instructions: Apply nickel thick amount to wound bed as instructed Secondary Dressing: Woven Gauze Sponge, Non-Sterile 4x4 in (Generic) 1 x Per Day/30 Days Discharge Instructions: Apply over primary dressing as directed. Secured With: [employer], 4.5x3.1 (in/yd) (Generic) 1 x Per Day/30 Days Discharge Instructions: Secure with Kerlix as directed. Secured With: Paper T ape, 2x10 (in/yd) (Generic) 1 x Per Day/30 Days Discharge Instructions: Secure dressing with tape as directed. WOUND #5: - Ankle Wound Laterality: Left, Anterior Cleanser: Soap and Water 1 x Per Day/30 Days Discharge Instructions: May shower and wash wound with dial antibacterial soap and water prior to dressing change. Prim Dressing: Santyl Ointment 1 x Per Day/30 Days ary Discharge Instructions: Apply nickel thick amount to wound bed as instructed Secondary Dressing: Woven Gauze Sponge, Non-Sterile 4x4  in (Generic) 1 x Per Day/30 Days Discharge Instructions: Apply over primary dressing as directed. Secured With: [employer], 4.5x3.1 (in/yd) (Generic) 1 x Per Day/30 Days Discharge Instructions: Secure with Kerlix as directed. Secured With: Paper T ape, 2x10 (in/yd) (Generic) 1 x Per Day/30 Days Discharge Instructions: Secure dressing with tape as directed. WOUND #6: - Lower Leg Wound Laterality: Left, Posterior, Distal Cleanser: Soap and Water 1 x Per Day/30 Days Discharge Instructions: May shower and wash wound with dial antibacterial soap and water prior to dressing change. Prim Dressing: Santyl Ointment 1 x Per Day/30 Days ary Discharge Instructions: Apply nickel thick amount to wound bed as instructed Secondary Dressing: Woven Gauze Sponge, Non-Sterile 4x4 in (Generic) 1 x Per Day/30 Days Discharge Instructions: Apply over primary dressing as directed. Secured With: [employer], 4.5x3.1 (in/yd) (Generic) 1 x Per Day/30 Days Discharge Instructions: Secure with Kerlix as directed. Secured With: Paper T ape, 2x10 (in/yd) (Generic) 1 x Per Day/30 Days Discharge Instructions: Secure dressing with tape as directed. WOUND #7: - Lower Leg Wound Laterality: Left, Anterior Cleanser: Soap and Water 1 x Per Day/30 Days Discharge Instructions: May shower and wash wound  with dial antibacterial soap and water prior to dressing change. Prim Dressing: Santyl Ointment 1 x Per Day/30 Days ary Discharge Instructions: Apply nickel thick amount to wound bed as instructed Secondary Dressing: Woven Gauze Sponge, Non-Sterile 4x4 in (Generic) 1 x Per Day/30 Days Discharge Instructions: Apply over primary dressing as directed. Secured With: [employer], 4.5x3.1 (in/yd) (Generic) 1 x Per Day/30 Days Discharge Instructions: Secure with Kerlix as directed. Secured With: Paper T ape, 2x10 (in/yd) (Generic) 1 x Per Day/30 Days Discharge Instructions: Secure dressing with tape as  directed. 1. In office sharp debridement 2. Continue Santyl to areas of slough 3. Collagen to the posterior leg wound 4. Follow-up in 2 weeks Electronic Signature(s) Signed: 05/18/2021 4:24:13 PM By: Geralyn Corwin DO Entered By: Geralyn Corwin on 05/18/2021 16:23:42 -------------------------------------------------------------------------------- HxROS Details Patient Name: Date of Service: Kelly Milling [name] 05/18/2021 3:15 PM Medical Record Number: [id number] Patient Account Number: [id number] Date of Birth/Sex: Treating RN: [birth year] (25 y.o. Debara Pickett, Yvonne Kendall Primary Care Provider: Dione Booze Other Clinician: Referring Provider: Treating Provider/Extender: Gennaro Africa in Treatment: 13 Information Obtained From Patient Respiratory Medical History: Positive for: Asthma Immunological Medical History: Positive for: Lupus Erythematosus Immunizations Pneumococcal Vaccine: Received Pneumococcal Vaccination: No Implantable Devices None Family and Social History Cancer: No; Diabetes: Yes - Paternal Grandparents; Heart Disease: No; Hereditary Spherocytosis: No; Hypertension: Yes - Paternal Grandparents; Kidney Disease: No; Lung Disease: No; Seizures: No; Stroke: No; Thyroid Problems: Yes - Maternal Grandparents,Paternal Grandparents; Tuberculosis: No; Never smoker; Marital Status - Married; Alcohol Use: Moderate; Drug Use: Current History - Marijuana; Caffeine Use: Daily - Coffee; Financial Concerns: No; Food, Clothing or Shelter Needs: No; Support System Lacking: No Electronic Signature(s) Signed: 05/18/2021 4:24:13 PM By: Geralyn Corwin DO Signed: 05/18/2021 5:22:33 PM By: Shawn Stall RN, BSN Entered By: Geralyn Corwin on 05/18/2021 16:19:58 -------------------------------------------------------------------------------- SuperBill Details Patient Name: Date of Service: Kelly Milling [name] 05/18/2021 Medical Record Number:  [id number] Patient Account Number: [id number] Date of Birth/Sex: Treating RN: [birth year] (25 y.o. Kelly [name] Primary Care Provider: Dione Booze Other Clinician: Referring Provider: Treating Provider/Extender: Gennaro Africa in Treatment: 13 Diagnosis Coding ICD-10 Codes Code Description [phone] Unspecified open wound, left lower leg, subsequent encounter S91.002D Unspecified open wound, left ankle, subsequent encounter M32.9 Systemic lupus erythematosus, unspecified Facility Procedures CPT4 Code: [id number] Description: [phone] - DEBRIDE WOUND 1ST 20 SQ CM OR < ICD-10 Diagnosis Description S81.802D Unspecified open wound, left lower leg, subsequent encounter Modifier: Quantity: 1 Physician Procedures : CPT4 Code Description Modifier [id number] - WC PHYS LEVEL 3 - EST PT ICD-10 Diagnosis Description S81.802D Unspecified open wound, left lower leg, subsequent encounter S91.002D Unspecified open wound, left ankle, subsequent encounter M32.9 Systemic  lupus erythematosus, unspecified Quantity: 1 : [id number] - WC PHYS DEBR WO ANESTH 20 SQ CM ICD-10 Diagnosis Description S81.802D Unspecified open wound, left lower leg, subsequent encounter Quantity: 1 Electronic Signature(s) Signed: 05/18/2021 4:24:13 PM By: Geralyn Corwin DO Entered By: Geralyn Corwin on 05/18/2021 16:23:58

## 2021-05-18 NOTE — Progress Notes (Signed)
Kelly [name], Kelly [name] ([id number]) Visit Report for 05/18/2021 Arrival Information Details Patient Name: Date of Service: Kelly [name], Kelly [name] 05/18/2021 3:15 PM Medical Record Number: [id number] Patient Account Number: [id number] Date of Birth/Sex: Treating RN: 1996/04/23 (25 y.o. [name] Primary Care Cabria Micalizzi: Dione Booze Other Clinician: Referring Scott Vanderveer: Treating Kaydyn Chism/Extender: Gennaro Africa in Treatment: 13 Visit Information History Since Last Visit Added or deleted any medications: No Patient Arrived: Knee Scooter Any new allergies or adverse reactions: No Arrival Time: 15:33 Had a fall or experienced change in No Transfer Assistance: None activities of daily living that may affect Patient Identification Verified: Yes risk of falls: Secondary Verification Process Completed: Yes Signs or symptoms of abuse/neglect since last visito No Patient Requires Transmission-Based Precautions: No Hospitalized since last visit: No Patient Has Alerts: Yes Implantable device outside of the clinic excluding No Patient Alerts: Left ABi=1.18 cellular tissue based products placed in the center since last visit: Has Dressing in Place as Prescribed: Yes Has Compression in Place as Prescribed: Yes Pain Present Now: Yes Electronic Signature(s) Signed: 05/18/2021 4:57:50 PM By: Antonieta Iba Entered By: Antonieta Iba on 05/18/2021 16:01:02 -------------------------------------------------------------------------------- Encounter Discharge Information Details Patient Name: Date of Service: Kelly Milling [name] 05/18/2021 3:15 PM Medical Record Number: [id number] Patient Account Number: [id number] Date of Birth/Sex: Treating RN: 1995/11/10 (25 y.o. [name] Primary Care Althia Egolf: Dione Booze Other Clinician: Referring Pamlea Finder: Treating Kita Neace/Extender: Gennaro Africa in Treatment: 13 Encounter Discharge  Information Items Post Procedure Vitals Discharge Condition: Stable Temperature (F): 96.9 Ambulatory Status: Knee Scooter Pulse (bpm): 98 Discharge Destination: Home Respiratory Rate (breaths/min): 16 Transportation: Private Auto Blood Pressure (mmHg): 130/91 Schedule Follow-up Appointment: Yes Clinical Summary of Care: Provided on 05/18/2021 Form Type Recipient Paper Patient Patient Electronic Signature(s) Signed: 05/18/2021 4:57:50 PM By: Antonieta Iba Entered By: Antonieta Iba on 05/18/2021 16:09:33 -------------------------------------------------------------------------------- Lower Extremity Assessment Details Patient Name: Date of Service: Kelly Milling [name] 05/18/2021 3:15 PM Medical Record Number: [id number] Patient Account Number: [id number] Date of Birth/Sex: Treating RN: [birth year] (25 y.o. [name] Primary Care Kolbie Lepkowski: Dione Booze Other Clinician: Referring Shanti Agresti: Treating Matasha Smigelski/Extender: Gennaro Africa in Treatment: 13 Edema Assessment Assessed: Kyra Searles: Yes] Franne Forts: No] Edema: [Left: Ye] [Right: s] Calf Left: Right: Point of Measurement: 27 cm From Medial Instep 32 cm Ankle Left: Right: Point of Measurement: 8 cm From Medial Instep 22 cm Vascular Assessment Pulses: Dorsalis Pedis Palpable: [Left:Yes] Electronic Signature(s) Signed: 05/18/2021 4:57:50 PM By: Antonieta Iba Entered By: Antonieta Iba on 05/18/2021 15:59:35 -------------------------------------------------------------------------------- Multi Wound Chart Details Patient Name: Date of Service: Kelly Milling [name] 05/18/2021 3:15 PM Medical Record Number: [id number] Patient Account Number: [id number] Date of Birth/Sex: Treating RN: February 08, 1996 (25 y.o. Debara Pickett, Millard.Loa Primary Care Brylon Brenning: Dione Booze Other Clinician: Referring Chere Babson: Treating Marshall Roehrich/Extender: Gennaro Africa in Treatment: 13 Vital  Signs Height(in): 62 Pulse(bpm): 98 Weight(lbs): Blood Pressure(mmHg): 130/91 Body Mass Index(BMI): Temperature(F): 98.9 Respiratory Rate(breaths/min): 16 Photos: [3:Left, Posterior Lower Leg] [4:Left, Lateral Ankle] [5:Left, Anterior Ankle] Wound Location: [3:Gradually Appeared] [4:Gradually Appeared] [5:Gradually Appeared] Wounding Event: [3:Cellulitis] [4:Cellulitis] [5:Cellulitis] Primary Etiology: [3:N/A] [4:Lupus] [5:Lupus] Secondary Etiology: [3:Asthma, Lupus Erythematosus] [4:Asthma, Lupus Erythematosus] [5:Asthma, Lupus Erythematosus] Comorbid History: [3:04/14/2021] [4:04/14/2021] [5:04/14/2021] Date Acquired: [3:3] [4:3] [5:3] Weeks of Treatment: [3:Open] [4:Open] [5:Open] Wound Status: [3:1x1.8x0.5] [4:4.2x5.6x0.3] [5:2x1x0.3] Measurements L x W x D (cm) [3:1.414] [4:18.473] [5:1.571] A (cm) : rea [3:0.707] [4:5.542] [5:0.471] Volume (cm) : [3:-15.40%] [4:-17.60%] [5:-66.80%] % Reduction  in A rea: [3:51.90%] [4:11.80%] [5:-150.50%] % Reduction in Volume: [3:7] Starting Position 1 (o'clock): [3:10] Ending Position 1 (o'clock): [3:0.8] Maximum Distance 1 (cm): [3:Yes] [4:No] [5:No] Undermining: [3:Full Thickness With Exposed Support Full Thickness Without Exposed] [5:Full Thickness Without Exposed] Classification: [3:Structures Medium] [4:Support Structures Medium] [5:Support Structures Medium] Exudate A mount: [3:Serosanguineous] [4:Serosanguineous] [5:Serosanguineous] Exudate Type: [3:red, brown] [4:red, brown] [5:red, brown] Exudate Color: [3:Distinct, outline attached] [4:Distinct, outline attached] [5:Distinct, outline attached] Wound Margin: [3:Large (67-100%)] [4:Small (1-33%)] [5:None Present (0%)] Granulation A mount: [3:Red, Pink] [4:Red, Pink] [5:N/A] Granulation Quality: [3:Small (1-33%)] [4:Large (67-100%)] [5:Large (67-100%)] Necrotic A mount: [3:Adherent Slough] [4:Eschar, Adherent Slough] [5:Eschar, Adherent Slough] Necrotic Tissue: [3:Fat Layer  (Subcutaneous Tissue): Yes Fat Layer (Subcutaneous Tissue): Yes Fat Layer (Subcutaneous Tissue): Yes] Exposed Structures: [3:Fascia: No Tendon: No Muscle: No Joint: No Bone: No None] [4:Fascia: No Tendon: No Muscle: No Joint: No Bone: No None] [5:Fascia: No Tendon: No Muscle: No Joint: No Bone: No None] Epithelialization: [3:N/A] [4:Debridement - Selective/Open Wound N/A] Debridement: Pre-procedure Verification/Time Out N/A [4:15:59] [5:N/A] Taken: [3:N/A] [4:Other] [5:N/A] Pain Control: [3:N/A] [4:Slough] [5:N/A] Tissue Debrided: [3:N/A] [4:Non-Viable Tissue] [5:N/A] Level: [3:N/A] [4:9] [5:N/A] Debridement A (sq cm): [3:rea N/A] [4:Blade, Forceps] [5:N/A] Instrument: [3:N/A] [4:Minimum] [5:N/A] Bleeding: [3:N/A] [4:Pressure] [5:N/A] Hemostasis A chieved: [3:N/A] [4:Procedure was tolerated well] [5:N/A] Debridement Treatment Response: [3:N/A] [4:4.2x5.6x0.3] [5:N/A] Post Debridement Measurements L x W x D (cm) [3:N/A] [4:5.542] [5:N/A] Post Debridement Volume: (cm) [3:N/A] [4:Debridement] [5:N/A] Wound Number: 6 7 N/A Photos: N/A Left, Distal, Posterior Lower Leg Left, Anterior Lower Leg N/A Wound Location: Gradually Appeared Other Lesion N/A Wounding Event: Lupus Lupus N/A Primary Etiology: N/A N/A N/A Secondary Etiology: Asthma, Lupus Erythematosus Asthma, Lupus Erythematosus N/A Comorbid History: 04/14/2021 05/18/2021 N/A Date Acquired: 3 0 N/A Weeks of Treatment: Open Open N/A Wound Status: 0.7x0.5x0.4 1.3x1.2x0.3 N/A Measurements L x W x D (cm) 0.275 1.225 N/A A (cm) : rea 0.11 0.368 N/A Volume (cm) : 56.20% N/A N/A % Reduction in A rea: 12.70% N/A N/A % Reduction in Volume: No No N/A Undermining: Full Thickness Without Exposed Full Thickness Without Exposed N/A Classification: Support Structures Support Structures Medium Medium N/A Exudate Amount: Serosanguineous Purulent N/A Exudate Type: red, brown yellow, brown, green N/A Exudate Color: Distinct,  outline attached Well defined, not attached N/A Wound Margin: Small (1-33%) Small (1-33%) N/A Granulation Amount: Red Red N/A Granulation Quality: Large (67-100%) Large (67-100%) N/A Necrotic Amount: Eschar Adherent Slough N/A Necrotic Tissue: Fascia: No Fat Layer (Subcutaneous Tissue): Yes N/A Exposed Structures: Fat Layer (Subcutaneous Tissue): No Fascia: No Tendon: No Tendon: No Muscle: No Muscle: No Joint: No Joint: No Bone: No Bone: No Medium (34-66%) None N/A Epithelialization: N/A Debridement - Selective/Open Wound N/A Debridement: Pre-procedure Verification/Time Out N/A 15:59 N/A Taken: N/A Other N/A Pain Control: N/A Slough N/A Tissue Debrided: N/A Non-Viable Tissue N/A Level: N/A 0.2 N/A Debridement A (sq cm): rea N/A Blade, Forceps N/A Instrument: N/A Minimum N/A Bleeding: N/A Pressure N/A Hemostasis A chieved: N/A Procedure was tolerated well N/A Debridement Treatment Response: N/A 1.3x1.2x0.3 N/A Post Debridement Measurements L x W x D (cm) N/A 0.368 N/A Post Debridement Volume: (cm) N/A Debridement N/A Procedures Performed: Treatment Notes Wound #3 (Lower Leg) Wound Laterality: Left, Posterior Cleanser Soap and Water Discharge Instruction: May shower and wash wound with dial antibacterial soap and water prior to dressing change. Peri-Wound Care Topical Primary Dressing Santyl Ointment Discharge Instruction: Apply nickel thick amount to wound bed as instructed Secondary Dressing Woven Gauze Sponge, Non-Sterile 4x4 in  Discharge Instruction: Apply over primary dressing as directed. Secured With [employer], 4.5x3.1 (in/yd) Discharge Instruction: Secure with Kerlix as directed. Paper Tape, 2x10 (in/yd) Discharge Instruction: Secure dressing with tape as directed. Compression Wrap Compression Stockings Add-Ons Wound #4 (Ankle) Wound Laterality: Left, Lateral Cleanser Soap and Water Discharge Instruction: May shower and  wash wound with dial antibacterial soap and water prior to dressing change. Peri-Wound Care Topical Primary Dressing Santyl Ointment Discharge Instruction: Apply nickel thick amount to wound bed as instructed Secondary Dressing Woven Gauze Sponge, Non-Sterile 4x4 in Discharge Instruction: Apply over primary dressing as directed. Secured With [employer], 4.5x3.1 (in/yd) Discharge Instruction: Secure with Kerlix as directed. Paper Tape, 2x10 (in/yd) Discharge Instruction: Secure dressing with tape as directed. Compression Wrap Compression Stockings Add-Ons Wound #5 (Ankle) Wound Laterality: Left, Anterior Cleanser Soap and Water Discharge Instruction: May shower and wash wound with dial antibacterial soap and water prior to dressing change. Peri-Wound Care Topical Primary Dressing Santyl Ointment Discharge Instruction: Apply nickel thick amount to wound bed as instructed Secondary Dressing Woven Gauze Sponge, Non-Sterile 4x4 in Discharge Instruction: Apply over primary dressing as directed. Secured With [employer], 4.5x3.1 (in/yd) Discharge Instruction: Secure with Kerlix as directed. Paper Tape, 2x10 (in/yd) Discharge Instruction: Secure dressing with tape as directed. Compression Wrap Compression Stockings Add-Ons Wound #6 (Lower Leg) Wound Laterality: Left, Posterior, Distal Cleanser Soap and Water Discharge Instruction: May shower and wash wound with dial antibacterial soap and water prior to dressing change. Peri-Wound Care Topical Primary Dressing Santyl Ointment Discharge Instruction: Apply nickel thick amount to wound bed as instructed Secondary Dressing Woven Gauze Sponge, Non-Sterile 4x4 in Discharge Instruction: Apply over primary dressing as directed. Secured With [employer], 4.5x3.1 (in/yd) Discharge Instruction: Secure with Kerlix as directed. Paper Tape, 2x10 (in/yd) Discharge Instruction: Secure dressing with tape as  directed. Compression Wrap Compression Stockings Add-Ons Wound #7 (Lower Leg) Wound Laterality: Left, Anterior Cleanser Soap and Water Discharge Instruction: May shower and wash wound with dial antibacterial soap and water prior to dressing change. Peri-Wound Care Topical Primary Dressing Santyl Ointment Discharge Instruction: Apply nickel thick amount to wound bed as instructed Secondary Dressing Woven Gauze Sponge, Non-Sterile 4x4 in Discharge Instruction: Apply over primary dressing as directed. Secured With [employer], 4.5x3.1 (in/yd) Discharge Instruction: Secure with Kerlix as directed. Paper Tape, 2x10 (in/yd) Discharge Instruction: Secure dressing with tape as directed. Compression Wrap Compression Stockings Add-Ons Electronic Signature(s) Signed: 05/18/2021 4:24:13 PM By: Geralyn Corwin DO Signed: 05/18/2021 5:22:33 PM By: Shawn Stall RN, BSN Entered By: Geralyn Corwin on 05/18/2021 16:18:33 -------------------------------------------------------------------------------- Multi-Disciplinary Care Plan Details Patient Name: Date of Service: Kelly Milling [name] 05/18/2021 3:15 PM Medical Record Number: [id number] Patient Account Number: [id number] Date of Birth/Sex: Treating RN: 05/25/1996 (25 y.o. [name] Primary Care Damontay Alred: Dione Booze Other Clinician: Referring Shayley Medlin: Treating Garth Diffley/Extender: Gennaro [place] in Treatment: 13 Multidisciplinary Care Plan reviewed with physician Active Inactive Abuse / Safety / Falls / Self Care Management Nursing Diagnoses: History of Falls Potential for falls Goals: Patient/caregiver will verbalize/demonstrate measures taken to prevent injury and/or falls Date Initiated: 04/21/2021 Target Resolution Date: 05/26/2021 Goal Status: Active Interventions: Assess fall risk on admission and as needed Assess impairment of mobility on admission and as needed per  policy Notes: Wound/Skin Impairment Nursing Diagnoses: Impaired tissue integrity Goals: Patient/caregiver will verbalize understanding of skin care regimen Date Initiated: 02/13/2021 Target Resolution Date: 05/26/2021 Goal Status: Active Ulcer/skin breakdown will have a volume reduction of  30% by week 4 Date Initiated: 02/13/2021 Date Inactivated: 04/21/2021 Target Resolution Date: 03/13/2021 Unmet Reason: pt did not return to Goal Status: Unmet clinic for F/U Ulcer/skin breakdown will have a volume reduction of 80% by week 12 Date Initiated: 04/21/2021 Target Resolution Date: 06/23/2021 Goal Status: Active Interventions: Assess patient/caregiver ability to obtain necessary supplies Assess patient/caregiver ability to perform ulcer/skin care regimen upon admission and as needed Assess ulceration(s) every visit Provide education on ulcer and skin care Treatment Activities: Skin care regimen initiated : 02/13/2021 Topical wound management initiated : 02/13/2021 Notes: Electronic Signature(s) Signed: 05/18/2021 4:57:50 PM By: Antonieta Iba Entered By: Antonieta Iba on 05/18/2021 15:34:45 -------------------------------------------------------------------------------- Pain Assessment Details Patient Name: Date of Service: Kelly Milling RIA Cherly Anderson. 05/18/2021 3:15 PM Medical Record Number: [id number] Patient Account Number: [id number] Date of Birth/Sex: Treating RN: 12/12/1995 (25 y.o. [name] Primary Care Milano Rosevear: Dione Booze Other Clinician: Referring Shondrea Steinert: Treating Yania Bogie/Extender: Gennaro [place] in Treatment: 13 Active Problems Location of Pain Severity and Description of Pain Patient Has Paino Yes Site Locations With Dressing Change: Yes Duration of the Pain. Constant / Intermittento Intermittent Rate the pain. Current Pain Level: 7 Character of Pain Describe the Pain: Burning, Throbbing Pain Management and  Medication Current Pain Management: Medication: Yes Cold Application: No Rest: Yes Massage: No Activity: No T.E.N.S.: No Heat Application: No Leg drop or elevation: No Is the Current Pain Management Adequate: Adequate How does your wound impact your activities of daily livingo Sleep: No Bathing: No Appetite: No Relationship With Others: No Bladder Continence: No Emotions: No Bowel Continence: No Work: No Toileting: No Drive: No Dressing: No Hobbies: No Electronic Signature(s) Signed: 05/18/2021 4:57:50 PM By: Antonieta Iba Entered By: Antonieta Iba on 05/18/2021 15:58:55 -------------------------------------------------------------------------------- Patient/Caregiver Education Details Patient Name: Date of Service: Babette Relic 9/22/2022andnbsp3:15 PM Medical Record Number: [id number] Patient Account Number: [id number] Date of Birth/Gender: Treating RN: 11/10/1995 (25 y.o. [name] Primary Care Physician: Dione Booze Other Clinician: Referring Physician: Treating Physician/Extender: Gennaro Africa in Treatment: 13 Education Assessment Education Provided To: Patient Education Topics Provided Wound/Skin Impairment: Methods: Explain/Verbal, Printed Responses: State content correctly [employer]) Signed: 05/18/2021 4:57:50 PM By: Antonieta Iba Entered By: Antonieta Iba on 05/18/2021 15:35:45 -------------------------------------------------------------------------------- Wound Assessment Details Patient Name: Date of Service: Kelly Milling RIA Cherly Anderson. 05/18/2021 3:15 PM Medical Record Number: [id number] Patient Account Number: [id number] Date of Birth/Sex: Treating RN: 09-Apr-1996 (25 y.o. [name] Primary Care Pragya Lofaso: Dione Booze Other Clinician: Referring Tin Engram: Treating Tyianna Menefee/Extender: Gennaro Africa in Treatment: 13 Wound Status Wound Number:  3 Primary Etiology: Cellulitis Wound Location: Left, Posterior Lower Leg Wound Status: Open Wounding Event: Gradually Appeared Comorbid History: Asthma, Lupus Erythematosus Date Acquired: 04/14/2021 Weeks Of Treatment: 3 Clustered Wound: No Photos Wound Measurements Length: (cm) 1 Width: (cm) 1.8 Depth: (cm) 0.5 Area: (cm) 1.414 Volume: (cm) 0.707 % Reduction in Area: -15.4% % Reduction in Volume: 51.9% Epithelialization: None Undermining: Yes Starting Position (o'clock): 7 Ending Position (o'clock): 10 Maximum Distance: (cm) 0.8 Wound Description Classification: Full Thickness With Exposed Support Struct Wound Margin: Distinct, outline attached Exudate Amount: Medium Exudate Type: Serosanguineous Exudate Color: red, brown ures Foul Odor After Cleansing: No Slough/Fibrino Yes Wound Bed Granulation Amount: Large (67-100%) Exposed Structure Granulation Quality: Red, Pink Fascia Exposed: No Necrotic Amount: Small (1-33%) Fat Layer (Subcutaneous Tissue) Exposed: Yes Necrotic Quality: Adherent Slough Tendon Exposed: No Muscle Exposed: No Joint Exposed: No Bone Exposed: No Treatment Notes Wound #  3 (Lower Leg) Wound Laterality: Left, Posterior Cleanser Soap and Water Discharge Instruction: May shower and wash wound with dial antibacterial soap and water prior to dressing change. Peri-Wound Care Topical Primary Dressing Santyl Ointment Discharge Instruction: Apply nickel thick amount to wound bed as instructed Secondary Dressing Woven Gauze Sponge, Non-Sterile 4x4 in Discharge Instruction: Apply over primary dressing as directed. Secured With [employer], 4.5x3.1 (in/yd) Discharge Instruction: Secure with Kerlix as directed. Paper Tape, 2x10 (in/yd) Discharge Instruction: Secure dressing with tape as directed. Compression Wrap Compression Stockings Add-Ons Electronic Signature(s) Signed: 05/18/2021 4:57:50 PM By: Antonieta Iba Entered By: Antonieta Iba on 05/18/2021 15:49:53 -------------------------------------------------------------------------------- Wound Assessment Details Patient Name: Date of Service: Kelly Milling RIA Cherly Anderson. 05/18/2021 3:15 PM Medical Record Number: [id number] Patient Account Number: [id number] Date of Birth/Sex: Treating RN: [birth year] (25 y.o. [name] Primary Care Ekin Pilar: Dione Booze Other Clinician: Referring Tomeeka Plaugher: Treating Anaika Santillano/Extender: Gennaro Africa in Treatment: 13 Wound Status Wound Number: 4 Primary Etiology: Cellulitis Wound Location: Left, Lateral Ankle Secondary Etiology: Lupus Wounding Event: Gradually Appeared Wound Status: Open Date Acquired: 04/14/2021 Comorbid History: Asthma, Lupus Erythematosus Weeks Of Treatment: 3 Clustered Wound: No Photos Wound Measurements Length: (cm) 4.2 Width: (cm) 5.6 Depth: (cm) 0.3 Area: (cm) 18.473 Volume: (cm) 5.542 % Reduction in Area: -17.6% % Reduction in Volume: 11.8% Epithelialization: None Tunneling: No Undermining: No Wound Description Classification: Full Thickness Without Exposed Support Structures Wound Margin: Distinct, outline attached Exudate Amount: Medium Exudate Type: Serosanguineous Exudate Color: red, brown Foul Odor After Cleansing: No Slough/Fibrino Yes Wound Bed Granulation Amount: Small (1-33%) Exposed Structure Granulation Quality: Red, Pink Fascia Exposed: No Necrotic Amount: Large (67-100%) Fat Layer (Subcutaneous Tissue) Exposed: Yes Necrotic Quality: Eschar, Adherent Slough Tendon Exposed: No Muscle Exposed: No Joint Exposed: No Bone Exposed: No Treatment Notes Wound #4 (Ankle) Wound Laterality: Left, Lateral Cleanser Soap and Water Discharge Instruction: May shower and wash wound with dial antibacterial soap and water prior to dressing change. Peri-Wound Care Topical Primary Dressing Santyl Ointment Discharge Instruction: Apply nickel thick  amount to wound bed as instructed Secondary Dressing Woven Gauze Sponge, Non-Sterile 4x4 in Discharge Instruction: Apply over primary dressing as directed. Secured With [employer], 4.5x3.1 (in/yd) Discharge Instruction: Secure with Kerlix as directed. Paper Tape, 2x10 (in/yd) Discharge Instruction: Secure dressing with tape as directed. Compression Wrap Compression Stockings Add-Ons Electronic Signature(s) Signed: 05/18/2021 4:57:50 PM By: Antonieta Iba Entered By: Antonieta Iba on 05/18/2021 15:52:39 -------------------------------------------------------------------------------- Wound Assessment Details Patient Name: Date of Service: Kelly Milling RIA Cherly Anderson. 05/18/2021 3:15 PM Medical Record Number: [id number] Patient Account Number: [id number] Date of Birth/Sex: Treating RN: 1995-12-15 (25 y.o. [name] Primary Care Livio Ledwith: Dione Booze Other Clinician: Referring Deborahann Poteat: Treating Tametha Banning/Extender: Gennaro [place] in Treatment: 13 Wound Status Wound Number: 5 Primary Etiology: Cellulitis Wound Location: Left, Anterior Ankle Secondary Etiology: Lupus Wounding Event: Gradually Appeared Wound Status: Open Date Acquired: 04/14/2021 Comorbid History: Asthma, Lupus Erythematosus Weeks Of Treatment: 3 Clustered Wound: No Photos Wound Measurements Length: (cm) 2 Width: (cm) 1 Depth: (cm) 0.3 Area: (cm) 1.571 Volume: (cm) 0.471 % Reduction in Area: -66.8% % Reduction in Volume: -150.5% Epithelialization: None Tunneling: No Undermining: No Wound Description Classification: Full Thickness Without Exposed Support Structures Wound Margin: Distinct, outline attached Exudate Amount: Medium Exudate Type: Serosanguineous Exudate Color: red, brown Foul Odor After Cleansing: No Slough/Fibrino Yes Wound Bed Granulation Amount: None Present (0%) Exposed Structure Necrotic Amount: Large (67-100%) Fascia Exposed: No Necrotic  Quality: Eschar,  Adherent Slough Fat Layer (Subcutaneous Tissue) Exposed: Yes Tendon Exposed: No Muscle Exposed: No Joint Exposed: No Bone Exposed: No Treatment Notes Wound #5 (Ankle) Wound Laterality: Left, Anterior Cleanser Soap and Water Discharge Instruction: May shower and wash wound with dial antibacterial soap and water prior to dressing change. Peri-Wound Care Topical Primary Dressing Santyl Ointment Discharge Instruction: Apply nickel thick amount to wound bed as instructed Secondary Dressing Woven Gauze Sponge, Non-Sterile 4x4 in Discharge Instruction: Apply over primary dressing as directed. Secured With [employer], 4.5x3.1 (in/yd) Discharge Instruction: Secure with Kerlix as directed. Paper Tape, 2x10 (in/yd) Discharge Instruction: Secure dressing with tape as directed. Compression Wrap Compression Stockings Add-Ons Electronic Signature(s) Signed: 05/18/2021 4:57:50 PM By: Antonieta Iba Entered By: Antonieta Iba on 05/18/2021 15:55:32 -------------------------------------------------------------------------------- Wound Assessment Details Patient Name: Date of Service: Kelly Milling RIA Cherly Anderson. 05/18/2021 3:15 PM Medical Record Number: [id number] Patient Account Number: [id number] Date of Birth/Sex: Treating RN: [birth year] (25 y.o. [name] Primary Care Latrina Guttman: Dione Booze Other Clinician: Referring Aisia Correira: Treating Gayna Braddy/Extender: Gennaro [place] in Treatment: 13 Wound Status Wound Number: 6 Primary Etiology: Lupus Wound Location: Left, Distal, Posterior Lower Leg Wound Status: Open Wounding Event: Gradually Appeared Comorbid History: Asthma, Lupus Erythematosus Date Acquired: 04/14/2021 Weeks Of Treatment: 3 Clustered Wound: No Photos Wound Measurements Length: (cm) 0.7 Width: (cm) 0.5 Depth: (cm) 0.4 Area: (cm) 0.275 Volume: (cm) 0.11 % Reduction in Area: 56.2% % Reduction in Volume:  12.7% Epithelialization: Medium (34-66%) Tunneling: No Undermining: No Wound Description Classification: Full Thickness Without Exposed Support Structures Wound Margin: Distinct, outline attached Exudate Amount: Medium Exudate Type: Serosanguineous Exudate Color: red, brown Foul Odor After Cleansing: No Slough/Fibrino Yes Wound Bed Granulation Amount: Small (1-33%) Exposed Structure Granulation Quality: Red Fascia Exposed: No Necrotic Amount: Large (67-100%) Fat Layer (Subcutaneous Tissue) Exposed: No Necrotic Quality: Eschar Tendon Exposed: No Muscle Exposed: No Joint Exposed: No Bone Exposed: No Treatment Notes Wound #6 (Lower Leg) Wound Laterality: Left, Posterior, Distal Cleanser Soap and Water Discharge Instruction: May shower and wash wound with dial antibacterial soap and water prior to dressing change. Peri-Wound Care Topical Primary Dressing Santyl Ointment Discharge Instruction: Apply nickel thick amount to wound bed as instructed Secondary Dressing Woven Gauze Sponge, Non-Sterile 4x4 in Discharge Instruction: Apply over primary dressing as directed. Secured With [employer], 4.5x3.1 (in/yd) Discharge Instruction: Secure with Kerlix as directed. Paper Tape, 2x10 (in/yd) Discharge Instruction: Secure dressing with tape as directed. Compression Wrap Compression Stockings Add-Ons Electronic Signature(s) Signed: 05/18/2021 4:57:50 PM By: Antonieta Iba Entered By: Antonieta Iba on 05/18/2021 15:54:22 -------------------------------------------------------------------------------- Wound Assessment Details Patient Name: Date of Service: Kelly Milling RIA Cherly Anderson. 05/18/2021 3:15 PM Medical Record Number: [id number] Patient Account Number: [id number] Date of Birth/Sex: Treating RN: 11/05/1995 (25 y.o. [name] Primary Care Estha Few: Dione Booze Other Clinician: Referring Lydiann Bonifas: Treating Devaris Quirk/Extender: Gennaro [place] in Treatment: 13 Wound Status Wound Number: 7 Primary Etiology: Lupus Wound Location: Left, Anterior Lower Leg Wound Status: Open Wounding Event: Other Lesion Comorbid History: Asthma, Lupus Erythematosus Date Acquired: 05/18/2021 Weeks Of Treatment: 0 Clustered Wound: No Photos Wound Measurements Length: (cm) 1.3 Width: (cm) 1.2 Depth: (cm) 0.3 Area: (cm) 1.225 Volume: (cm) 0.368 % Reduction in Area: % Reduction in Volume: Epithelialization: None Tunneling: No Undermining: No Wound Description Classification: Full Thickness Without Exposed Support Structures Wound Margin: Well defined, not attached Exudate Amount: Medium Exudate Type: Purulent Exudate Color: yellow, brown, green Foul Odor After Cleansing: No Slough/Fibrino Yes Wound  Bed Granulation Amount: Small (1-33%) Exposed Structure Granulation Quality: Red Fascia Exposed: No Necrotic Amount: Large (67-100%) Fat Layer (Subcutaneous Tissue) Exposed: Yes Necrotic Quality: Adherent Slough Tendon Exposed: No Muscle Exposed: No Joint Exposed: No Bone Exposed: No Treatment Notes Wound #7 (Lower Leg) Wound Laterality: Left, Anterior Cleanser Soap and Water Discharge Instruction: May shower and wash wound with dial antibacterial soap and water prior to dressing change. Peri-Wound Care Topical Primary Dressing Santyl Ointment Discharge Instruction: Apply nickel thick amount to wound bed as instructed Secondary Dressing Woven Gauze Sponge, Non-Sterile 4x4 in Discharge Instruction: Apply over primary dressing as directed. Secured With [employer], 4.5x3.1 (in/yd) Discharge Instruction: Secure with Kerlix as directed. Paper Tape, 2x10 (in/yd) Discharge Instruction: Secure dressing with tape as directed. Compression Wrap Compression Stockings Add-Ons Electronic Signature(s) Signed: 05/18/2021 4:57:50 PM By: Antonieta Iba Entered By: Antonieta Iba on 05/18/2021  15:58:17 -------------------------------------------------------------------------------- Vitals Details Patient Name: Date of Service: Kelly Milling [name] 05/18/2021 3:15 PM Medical Record Number: [id number] Patient Account Number: [id number] Date of Birth/Sex: Treating RN: [birth year] (25 y.o. [name] Primary Care Lovenia Debruler: Dione Booze Other Clinician: Referring Shanecia Hoganson: Treating Lannette Avellino/Extender: Gennaro [place] in Treatment: 13 Vital Signs Time Taken: 15:35 Temperature (F): 98.9 Height (in): 62 Pulse (bpm): 98 Respiratory Rate (breaths/min): 16 Blood Pressure (mmHg): 130/91 Reference Range: 80 - 120 mg / dl Electronic Signature(s) Signed: 05/18/2021 4:57:50 PM By: Antonieta Iba Entered By: Antonieta Iba on 05/18/2021 15:37:18

## 2021-05-22 DIAGNOSIS — S81002D Unspecified open wound, left knee, subsequent encounter: Secondary | ICD-10-CM | POA: Diagnosis not present

## 2021-05-22 DIAGNOSIS — S81802D Unspecified open wound, left lower leg, subsequent encounter: Secondary | ICD-10-CM | POA: Diagnosis not present

## 2021-05-22 DIAGNOSIS — M329 Systemic lupus erythematosus, unspecified: Secondary | ICD-10-CM | POA: Diagnosis not present

## 2021-06-01 DIAGNOSIS — M329 Systemic lupus erythematosus, unspecified: Secondary | ICD-10-CM | POA: Diagnosis not present

## 2021-06-01 DIAGNOSIS — S86011D Strain of right Achilles tendon, subsequent encounter: Secondary | ICD-10-CM | POA: Diagnosis not present

## 2021-06-02 ENCOUNTER — Other Ambulatory Visit: Payer: Self-pay

## 2021-06-02 ENCOUNTER — Encounter (HOSPITAL_BASED_OUTPATIENT_CLINIC_OR_DEPARTMENT_OTHER): Payer: BC Managed Care – PPO | Attending: Internal Medicine | Admitting: Internal Medicine

## 2021-06-02 DIAGNOSIS — S91002D Unspecified open wound, left ankle, subsequent encounter: Secondary | ICD-10-CM | POA: Diagnosis not present

## 2021-06-02 DIAGNOSIS — J45909 Unspecified asthma, uncomplicated: Secondary | ICD-10-CM | POA: Insufficient documentation

## 2021-06-02 DIAGNOSIS — X58XXXD Exposure to other specified factors, subsequent encounter: Secondary | ICD-10-CM | POA: Diagnosis not present

## 2021-06-02 DIAGNOSIS — S81802D Unspecified open wound, left lower leg, subsequent encounter: Secondary | ICD-10-CM | POA: Diagnosis not present

## 2021-06-02 DIAGNOSIS — M329 Systemic lupus erythematosus, unspecified: Secondary | ICD-10-CM | POA: Insufficient documentation

## 2021-06-02 NOTE — Progress Notes (Signed)
Kelly [name] ([id number]) Visit Report for 06/02/2021 Chief Complaint Document Details Patient Name: Date of Service: Kelly [name]. 06/02/2021 10:30 A M Medical Record Number: [id number] Patient Account Number: [id number] Date of Birth/Sex: Treating RN: [birth year] (25 y.o. Kelly [name] Primary Care Provider: Dione [name] Other Clinician: Referring Provider: Treating Provider/Extender: Kelly [name] in Treatment: 15 Information Obtained from: Patient Chief Complaint Bilateral lower extremity wounds 04/21/21: left lower extremity wound s/p cellulitis Electronic Signature(s) Signed: 06/02/2021 12:29:09 PM By: Kelly Corwin DO Entered By: Kelly [name] on 06/02/2021 12:09:30 -------------------------------------------------------------------------------- HPI Details Patient Name: Date of Service: Kelly [name] Kelly H M. 06/02/2021 10:30 A M Medical Record Number: [id number] Patient Account Number: [id number] Date of Birth/Sex: Treating RN: [birth year] (25 y.o. Kelly [name] Primary Care Provider: Dione [name] Other Clinician: Referring Provider: Treating Provider/Extender: Kelly [name] in Treatment: 15 History of Present Illness HPI Description: Admission 6/21 [name] is A 24 year old female with a past medical history of systemic lupus erythematosus that presents with bilateral lower extremity wounds that started in April 2022. She has had skin issues in the past where she developed very small wounds but these healed with time. She has never had open wounds like she does on her legs. She currently reports minimal drainage to the wounds. She does have pain to these areas but has been overall stable. She denies signs of infection. She has visited the ED for this issue and was recently prescribed doxycycline for possible cellulitis. She follows with [facility] for her SLE and is  currently on Plaquenil, prednisone and CellCept. 6/27; patient presents for 1 week follow-up. She states she saw her rheumatologist on 6/22 and prednisone was increased from 10 mg to 20 mg daily. She is currently at the highest dose of Plaquenil and CellCept and is going to try an infusion later today at her regimen. She reports improvement to the wounds. She has been using collagen with dressing changes. She currently denies signs of infection. Readmission 8/26 Patient presents to clinic today for open wounds to her left lower extremity. Her previous wounds that were treated have closed. She states that on 8/9 she developed cellulitis and was treated with 2 rounds of antibiotics. Her cellulitis has resolved. She had blisters that opened and are now large wounds. She has seen her dermatologist and rheumatologist since she has systemic lupus and a hard time healing. No changes have been made to her medications. She was recommended antibiotic ointment by her dermatologist to her wounds She picked up the antibiotic ointment today.. She reports pain to these areas. She denies purulent drainage, increased warmth or erythema to the left lower extremity. She currently keeps the areas covered. Of note she had an Achilles tendon rupture on her right leg and is currently in a soft cast. 9/13; patient presents for follow-up. She missed her last clinic appointment. She has been using Santyl to the wound beds daily. She has no issues or complaints today. She reports the Santyl is helping clean up the wounds. She currently denies signs of infection. 9/22; patient states that 1 week ago she was seen by dermatology and they placed an Unna boot with Bactroban on her left lower extremity. Today she presents with a Unna boot in place. She currently denies signs of infection. 10/7; patient presents for follow-up. She reports improvement. She has been using Santyl to the areas of nonviable tissue and collagen to  granulation tissue. She has been approved  for infusions for her lupus. She is going to start these soon. These will be injection she can do at home. She currently denies signs of infection. Electronic Signature(s) Signed: 06/02/2021 12:29:09 PM By: Kelly Corwin DO Entered By: Kelly [name] on 06/02/2021 12:10:13 -------------------------------------------------------------------------------- Physical Exam Details Patient Name: Date of Service: Kelly Relic. 06/02/2021 10:30 A M Medical Record Number: [id number] Patient Account Number: [id number] Date of Birth/Sex: Treating RN: 11/24/1995 (25 y.o. Kelly [name] Primary Care Provider: Dione [name] Other Clinician: Referring Provider: Treating Provider/Extender: Kelly [name] in Treatment: 15 Constitutional respirations regular, non-labored and within target range for patient.. Cardiovascular 2+ dorsalis pedis/posterior tibialis pulses. Psychiatric pleasant and cooperative. Notes Left lower extremity: 5 wounds present throughout. 4 out of the 5 have slough throughout. The posterior wound has granulation tissue. No obvious signs of infection. Overall appearance is improved since last clinic visit. Electronic Signature(s) Signed: 06/02/2021 12:29:09 PM By: Kelly Corwin DO Entered By: Kelly [name] on 06/02/2021 12:11:19 -------------------------------------------------------------------------------- Physician Orders Details Patient Name: Date of Service: Kelly [name] Kelly H M. 06/02/2021 10:30 A M Medical Record Number: [id number] Patient Account Number: [id number] Date of Birth/Sex: Treating RN: [birth year] (25 y.o. Kelly [name] Primary Care Provider: Dione [name] Other Clinician: Referring Provider: Treating Provider/Extender: Kelly [name] in Treatment: 15 Verbal / Phone Orders: No Diagnosis Coding ICD-10 Coding Code Description S81.802D  Unspecified open wound, left lower leg, subsequent encounter S91.002D Unspecified open wound, left ankle, subsequent encounter M32.9 Systemic lupus erythematosus, unspecified Follow-up Appointments ppointment in 2 weeks. - Dr. Mikey Bussing Return A Bathing/ Shower/ Hygiene May shower and wash wound with soap and water. Edema Control - Lymphedema / SCD / Other Elevate legs to the level of the heart or above for 30 minutes daily and/or when sitting, a frequency of: - 3-4 times a day throughout the day. Avoid standing for long periods of time. Additional Orders / Instructions Follow Nutritious Diet Wound Treatment Wound #3 - Lower Leg Wound Laterality: Left, Posterior Cleanser: Soap and Water 1 x Per Day/30 Days Discharge Instructions: May shower and wash wound with dial antibacterial soap and water prior to dressing change. Prim Dressing: Promogran Prisma Matrix, 4.34 (sq in) (silver collagen) 1 x Per Day/30 Days ary Discharge Instructions: Moisten collagen with saline or hydrogel Secondary Dressing: Woven Gauze Sponge, Non-Sterile 4x4 in (Generic) 1 x Per Day/30 Days Discharge Instructions: Apply over primary dressing as directed. Secured With: [employer], 4.5x3.1 (in/yd) (Generic) 1 x Per Day/30 Days Discharge Instructions: Secure with Kerlix as directed. Secured With: Paper Tape, 2x10 (in/yd) (Generic) 1 x Per Day/30 Days Discharge Instructions: Secure dressing with tape as directed. Wound #4 - Ankle Wound Laterality: Left, Lateral Cleanser: Soap and Water 1 x Per Day/30 Days Discharge Instructions: May shower and wash wound with dial antibacterial soap and water prior to dressing change. Prim Dressing: Santyl Ointment 1 x Per Day/30 Days ary Discharge Instructions: Apply nickel thick amount to wound bed as instructed Secondary Dressing: Woven Gauze Sponge, Non-Sterile 4x4 in (Generic) 1 x Per Day/30 Days Discharge Instructions: Apply over primary dressing as directed. Secured  With: [employer], 4.5x3.1 (in/yd) (Generic) 1 x Per Day/30 Days Discharge Instructions: Secure with Kerlix as directed. Secured With: Paper Tape, 2x10 (in/yd) (Generic) 1 x Per Day/30 Days Discharge Instructions: Secure dressing with tape as directed. Wound #5 - Ankle Wound Laterality: Left, Anterior Cleanser: Soap and Water 1 x Per Day/30 Days Discharge Instructions: May shower and wash wound  with dial antibacterial soap and water prior to dressing change. Prim Dressing: Santyl Ointment 1 x Per Day/30 Days ary Discharge Instructions: Apply nickel thick amount to wound bed as instructed Secondary Dressing: Woven Gauze Sponge, Non-Sterile 4x4 in (Generic) 1 x Per Day/30 Days Discharge Instructions: Apply over primary dressing as directed. Secured With: [employer], 4.5x3.1 (in/yd) (Generic) 1 x Per Day/30 Days Discharge Instructions: Secure with Kerlix as directed. Secured With: Paper Tape, 2x10 (in/yd) (Generic) 1 x Per Day/30 Days Discharge Instructions: Secure dressing with tape as directed. Wound #6 - Lower Leg Wound Laterality: Left, Posterior, Distal Cleanser: Soap and Water 1 x Per Day/30 Days Discharge Instructions: May shower and wash wound with dial antibacterial soap and water prior to dressing change. Prim Dressing: Santyl Ointment 1 x Per Day/30 Days ary Discharge Instructions: Apply nickel thick amount to wound bed as instructed Secondary Dressing: Woven Gauze Sponge, Non-Sterile 4x4 in (Generic) 1 x Per Day/30 Days Discharge Instructions: Apply over primary dressing as directed. Secured With: [employer], 4.5x3.1 (in/yd) (Generic) 1 x Per Day/30 Days Discharge Instructions: Secure with Kerlix as directed. Secured With: Paper Tape, 2x10 (in/yd) (Generic) 1 x Per Day/30 Days Discharge Instructions: Secure dressing with tape as directed. Wound #7 - Lower Leg Wound Laterality: Left, Anterior Cleanser: Soap and Water 1 x Per Day/30 Days Discharge  Instructions: May shower and wash wound with dial antibacterial soap and water prior to dressing change. Prim Dressing: Santyl Ointment ary 1 x Per Day/30 Days Discharge Instructions: Apply nickel thick amount to wound bed as instructed Secondary Dressing: Woven Gauze Sponge, Non-Sterile 4x4 in (Generic) 1 x Per Day/30 Days Discharge Instructions: Apply over primary dressing as directed. Secured With: [employer], 4.5x3.1 (in/yd) (Generic) 1 x Per Day/30 Days Discharge Instructions: Secure with Kerlix as directed. Secured With: Paper Tape, 2x10 (in/yd) (Generic) 1 x Per Day/30 Days Discharge Instructions: Secure dressing with tape as directed. Electronic Signature(s) Signed: 06/02/2021 12:29:09 PM By: Kelly Corwin DO Entered By: Kelly [name] on 06/02/2021 12:24:22 -------------------------------------------------------------------------------- Problem List Details Patient Name: Date of Service: Kelly [name] Kelly H M. 06/02/2021 10:30 A M Medical Record Number: [id number] Patient Account Number: [id number] Date of Birth/Sex: Treating RN: July 15, 1996 (25 y.o. Kelly [name] Primary Care Provider: Dione [name] Other Clinician: Referring Provider: Treating Provider/Extender: Kelly [name] in Treatment: 15 Active Problems ICD-10 Encounter Code Description Active Date MDM Diagnosis S81.802D Unspecified open wound, left lower leg, subsequent encounter 05/09/2021 No Yes S91.002D Unspecified open wound, left ankle, subsequent encounter 05/09/2021 No Yes M32.9 Systemic lupus erythematosus, unspecified 02/14/2021 No Yes Inactive Problems ICD-10 Code Description Active Date Inactive Date L97.819 Non-pressure chronic ulcer of other part of right lower leg with unspecified severity 02/13/2021 02/13/2021 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 with unspecified severity 02/13/2021 02/13/2021 W09.811B Unspecified open wound,  left lower leg, initial encounter 04/21/2021 04/21/2021 S91.002A Unspecified open wound, left ankle, initial encounter 04/21/2021 04/21/2021 Resolved Problems Electronic Signature(s) Signed: 06/02/2021 12:29:09 PM By: Kelly Corwin DO Entered By: Kelly [name] on 06/02/2021 12:09:12 -------------------------------------------------------------------------------- Progress Note Details Patient Name: Date of Service: Kelly [name] Kelly H M. 06/02/2021 10:30 A M Medical Record Number: [id number] Patient Account Number: [id number] Date of Birth/Sex: Treating RN: 10/26/1995 (25 y.o. Kelly [name] Primary Care Provider: Dione [name] Other Clinician: Referring Provider: Treating Provider/Extender: Kelly [name] in Treatment: 15 Subjective Chief Complaint Information obtained from Patient Bilateral lower extremity wounds 04/21/21: left lower extremity wound s/p  cellulitis History of Present Illness (HPI) Admission 6/21 [name] is A 25 year old female with a past medical history of systemic lupus erythematosus that presents with bilateral lower extremity wounds that started in April 2022. She has had skin issues in the past where she developed very small wounds but these healed with time. She has never had open wounds like she does on her legs. She currently reports minimal drainage to the wounds. She does have pain to these areas but has been overall stable. She denies signs of infection. She has visited the ED for this issue and was recently prescribed doxycycline for possible cellulitis. She follows with [facility] for her SLE and is currently on Plaquenil, prednisone and CellCept. 6/27; patient presents for 1 week follow-up. She states she saw her rheumatologist on 6/22 and prednisone was increased from 10 mg to 20 mg daily. She is currently at the highest dose of Plaquenil and CellCept and is going to try an infusion later today at her  regimen. She reports improvement to the wounds. She has been using collagen with dressing changes. She currently denies signs of infection. Readmission 8/26 Patient presents to clinic today for open wounds to her left lower extremity. Her previous wounds that were treated have closed. She states that on 8/9 she developed cellulitis and was treated with 2 rounds of antibiotics. Her cellulitis has resolved. She had blisters that opened and are now large wounds. She has seen her dermatologist and rheumatologist since she has systemic lupus and a hard time healing. No changes have been made to her medications. She was recommended antibiotic ointment by her dermatologist to her wounds She picked up the antibiotic ointment today.. She reports pain to these areas. She denies purulent drainage, increased warmth or erythema to the left lower extremity. She currently keeps the areas covered. Of note she had an Achilles tendon rupture on her right leg and is currently in a soft cast. 9/13; patient presents for follow-up. She missed her last clinic appointment. She has been using Santyl to the wound beds daily. She has no issues or complaints today. She reports the Santyl is helping clean up the wounds. She currently denies signs of infection. 9/22; patient states that 1 week ago she was seen by dermatology and they placed an Unna boot with Bactroban on her left lower extremity. Today she presents with a Unna boot in place. She currently denies signs of infection. 10/7; patient presents for follow-up. She reports improvement. She has been using Santyl to the areas of nonviable tissue and collagen to granulation tissue. She has been approved for infusions for her lupus. She is going to start these soon. These will be injection she can do at home. She currently denies signs of infection. Patient History Information obtained from Patient. Family History Diabetes - Paternal Grandparents, Hypertension - Paternal  Grandparents, Thyroid Problems - Maternal Grandparents,Paternal Grandparents, No family history of Cancer, Heart Disease, Hereditary Spherocytosis, Kidney Disease, Lung Disease, Seizures, Stroke, Tuberculosis. Social History Never smoker, Marital Status - Married, Alcohol Use - Moderate, Drug Use - Current History - Marijuana, Caffeine Use - Daily - Coffee. Medical History Respiratory Patient has history of Asthma Immunological Patient has history of Lupus Erythematosus Objective Constitutional respirations regular, non-labored and within target range for patient.. Vitals Time Taken: 11:04 AM, Height: 62 in, Temperature: 98.2 F, Pulse: 86 bpm, Respiratory Rate: 17 breaths/min, Blood Pressure: 124/83 mmHg. Cardiovascular 2+ dorsalis pedis/posterior tibialis pulses. Psychiatric pleasant and cooperative. General Notes: Left lower extremity:  5 wounds present throughout. 4 out of the 5 have slough throughout. The posterior wound has granulation tissue. No obvious signs of infection. Overall appearance is improved since last clinic visit. Integumentary (Hair, Skin) Wound #3 status is Open. Original cause of wound was Gradually Appeared. The date acquired was: 04/14/2021. The wound has been in treatment 6 weeks. The wound is located on the Left,Posterior Lower Leg. The wound measures 1.2cm length x 1.5cm width x 0.7cm depth; 1.414cm^2 area and 0.99cm^3 volume. There is Fat Layer (Subcutaneous Tissue) exposed. There is a medium amount of serosanguineous drainage noted. The wound margin is distinct with the outline attached to the wound base. There is large (67-100%) red, pink granulation within the wound bed. There is a small (1-33%) amount of necrotic tissue within the wound bed. Wound #4 status is Open. Original cause of wound was Gradually Appeared. The date acquired was: 04/14/2021. The wound has been in treatment 6 weeks. The wound is located on the Left,Lateral Ankle. The wound measures 5.5cm  length x 5cm width x 0.3cm depth; 21.598cm^2 area and 6.48cm^3 volume. There is Fat Layer (Subcutaneous Tissue) exposed. There is a medium amount of serosanguineous drainage noted. The wound margin is distinct with the outline attached to the wound base. There is small (1-33%) red, pink granulation within the wound bed. There is a large (67-100%) amount of necrotic tissue within the wound bed including Eschar. Wound #5 status is Open. Original cause of wound was Gradually Appeared. The date acquired was: 04/14/2021. The wound has been in treatment 6 weeks. The wound is located on the Left,Anterior Ankle. The wound measures 2.2cm length x 1.7cm width x 0.5cm depth; 2.937cm^2 area and 1.469cm^3 volume. There is Fat Layer (Subcutaneous Tissue) exposed. There is a medium amount of serosanguineous drainage noted. The wound margin is distinct with the outline attached to the wound base. There is no granulation within the wound bed. There is a large (67-100%) amount of necrotic tissue within the wound bed including Eschar. Wound #6 status is Open. Original cause of wound was Gradually Appeared. The date acquired was: 04/14/2021. The wound has been in treatment 6 weeks. The wound is located on the Left,Distal,Posterior Lower Leg. The wound measures 1cm length x 0.7cm width x 0.5cm depth; 0.55cm^2 area and 0.275cm^3 volume. There is a medium amount of serosanguineous drainage noted. The wound margin is distinct with the outline attached to the wound base. There is small (1-33%) red granulation within the wound bed. There is a large (67-100%) amount of necrotic tissue within the wound bed including Eschar. Wound #7 status is Open. Original cause of wound was Other Lesion. The date acquired was: 05/18/2021. The wound has been in treatment 2 weeks. The wound is located on the Left,Anterior Lower Leg. The wound measures 1.5cm length x 1cm width x 0.7cm depth; 1.178cm^2 area and 0.825cm^3 volume. There is Fat Layer  (Subcutaneous Tissue) exposed. There is a medium amount of purulent drainage noted. The wound margin is well defined and not attached to the wound base. There is small (1-33%) red granulation within the wound bed. There is a large (67-100%) amount of necrotic tissue within the wound bed. Assessment Active Problems ICD-10 Unspecified open wound, left lower leg, subsequent encounter Unspecified open wound, left ankle, subsequent encounter Systemic lupus erythematosus, unspecified Patient's wounds have shown slight improvement in appearance and size since last clinic visit. I recommended continuing Santyl to the areas of nonviable tissue and collagen to the areas of granulation tissue. She has  been approved for home injections for her lupus. I am hopeful that this will help facilitate wound healing. Unfortunately This is a rheumatological issue And I am not able to provide a significant contribution to her wound care. She understands this. I am happy to contiue to see this patient on a regular basis and offer advice where I can. Plan Follow-up Appointments: Return Appointment in 2 weeks. - Dr. Mikey Bussing Bathing/ Shower/ Hygiene: May shower and wash wound with soap and water. Edema Control - Lymphedema / SCD / Other: Elevate legs to the level of the heart or above for 30 minutes daily and/or when sitting, a frequency of: - 3-4 times a day throughout the day. Avoid standing for long periods of time. Additional Orders / Instructions: Follow Nutritious Diet WOUND #3: - Lower Leg Wound Laterality: Left, Posterior Cleanser: Soap and Water 1 x Per Day/30 Days Discharge Instructions: May shower and wash wound with dial antibacterial soap and water prior to dressing change. Prim Dressing: Promogran Prisma Matrix, 4.34 (sq in) (silver collagen) 1 x Per Day/30 Days ary Discharge Instructions: Moisten collagen with saline or hydrogel Secondary Dressing: Woven Gauze Sponge, Non-Sterile 4x4 in (Generic) 1 x  Per Day/30 Days Discharge Instructions: Apply over primary dressing as directed. Secured With: [employer], 4.5x3.1 (in/yd) (Generic) 1 x Per Day/30 Days Discharge Instructions: Secure with Kerlix as directed. Secured With: Paper T ape, 2x10 (in/yd) (Generic) 1 x Per Day/30 Days Discharge Instructions: Secure dressing with tape as directed. WOUND #4: - Ankle Wound Laterality: Left, Lateral Cleanser: Soap and Water 1 x Per Day/30 Days Discharge Instructions: May shower and wash wound with dial antibacterial soap and water prior to dressing change. Prim Dressing: Santyl Ointment 1 x Per Day/30 Days ary Discharge Instructions: Apply nickel thick amount to wound bed as instructed Secondary Dressing: Woven Gauze Sponge, Non-Sterile 4x4 in (Generic) 1 x Per Day/30 Days Discharge Instructions: Apply over primary dressing as directed. Secured With: [employer], 4.5x3.1 (in/yd) (Generic) 1 x Per Day/30 Days Discharge Instructions: Secure with Kerlix as directed. Secured With: Paper T ape, 2x10 (in/yd) (Generic) 1 x Per Day/30 Days Discharge Instructions: Secure dressing with tape as directed. WOUND #5: - Ankle Wound Laterality: Left, Anterior Cleanser: Soap and Water 1 x Per Day/30 Days Discharge Instructions: May shower and wash wound with dial antibacterial soap and water prior to dressing change. Prim Dressing: Santyl Ointment 1 x Per Day/30 Days ary Discharge Instructions: Apply nickel thick amount to wound bed as instructed Secondary Dressing: Woven Gauze Sponge, Non-Sterile 4x4 in (Generic) 1 x Per Day/30 Days Discharge Instructions: Apply over primary dressing as directed. Secured With: [employer], 4.5x3.1 (in/yd) (Generic) 1 x Per Day/30 Days Discharge Instructions: Secure with Kerlix as directed. Secured With: Paper T ape, 2x10 (in/yd) (Generic) 1 x Per Day/30 Days Discharge Instructions: Secure dressing with tape as directed. WOUND #6: - Lower Leg Wound  Laterality: Left, Posterior, Distal Cleanser: Soap and Water 1 x Per Day/30 Days Discharge Instructions: May shower and wash wound with dial antibacterial soap and water prior to dressing change. Prim Dressing: Santyl Ointment 1 x Per Day/30 Days ary Discharge Instructions: Apply nickel thick amount to wound bed as instructed Secondary Dressing: Woven Gauze Sponge, Non-Sterile 4x4 in (Generic) 1 x Per Day/30 Days Discharge Instructions: Apply over primary dressing as directed. Secured With: [employer], 4.5x3.1 (in/yd) (Generic) 1 x Per Day/30 Days Discharge Instructions: Secure with Kerlix as directed. Secured With: Paper T ape, 2x10 (in/yd) (Generic)  1 x Per Day/30 Days Discharge Instructions: Secure dressing with tape as directed. WOUND #7: - Lower Leg Wound Laterality: Left, Anterior Cleanser: Soap and Water 1 x Per Day/30 Days Discharge Instructions: May shower and wash wound with dial antibacterial soap and water prior to dressing change. Prim Dressing: Santyl Ointment 1 x Per Day/30 Days ary Discharge Instructions: Apply nickel thick amount to wound bed as instructed Secondary Dressing: Woven Gauze Sponge, Non-Sterile 4x4 in (Generic) 1 x Per Day/30 Days Discharge Instructions: Apply over primary dressing as directed. Secured With: [employer], 4.5x3.1 (in/yd) (Generic) 1 x Per Day/30 Days Discharge Instructions: Secure with Kerlix as directed. Secured With: Paper T ape, 2x10 (in/yd) (Generic) 1 x Per Day/30 Days Discharge Instructions: Secure dressing with tape as directed. 1. Continue Santyl and collagen 2. Follow-up in 2 weeks Electronic Signature(s) Signed: 06/02/2021 12:29:09 PM By: Kelly Corwin DO Entered By: Kelly [name] on 06/02/2021 12:28:14 -------------------------------------------------------------------------------- HxROS Details Patient Name: Date of Service: Kelly [name] Kelly H M. 06/02/2021 10:30 A M Medical Record Number:  [id number] Patient Account Number: [id number] Date of Birth/Sex: Treating RN: Mar 07, 1996 (25 y.o. Kelly [name] Primary Care Provider: Dione [name] Other Clinician: Referring Provider: Treating Provider/Extender: Kelly [name] in Treatment: 15 Information Obtained From Patient Respiratory Medical History: Positive for: Asthma Immunological Medical History: Positive for: Lupus Erythematosus Immunizations Pneumococcal Vaccine: Received Pneumococcal Vaccination: No Implantable Devices None Family and Social History Cancer: No; Diabetes: Yes - Paternal Grandparents; Heart Disease: No; Hereditary Spherocytosis: No; Hypertension: Yes - Paternal Grandparents; Kidney Disease: No; Lung Disease: No; Seizures: No; Stroke: No; Thyroid Problems: Yes - Maternal Grandparents,Paternal Grandparents; Tuberculosis: No; Never smoker; Marital Status - Married; Alcohol Use: Moderate; Drug Use: Current History - Marijuana; Caffeine Use: Daily - Coffee; Financial Concerns: No; Food, Clothing or Shelter Needs: No; Support System Lacking: No Electronic Signature(s) Signed: 06/02/2021 12:29:09 PM By: Kelly Corwin DO Signed: 06/02/2021 12:34:01 PM By: Zenaida Deed RN, BSN Entered By: Kelly [name] on 06/02/2021 12:10:22 -------------------------------------------------------------------------------- SuperBill Details Patient Name: Date of Service: Kelly [name] Kelly H M. 06/02/2021 Medical Record Number: [id number] Patient Account Number: [id number] Date of Birth/Sex: Treating RN: 02-20-1996 (25 y.o. Kelly [name] Primary Care Provider: Dione [name] Other Clinician: Referring Provider: Treating Provider/Extender: Kelly [name] in Treatment: 15 Diagnosis Coding ICD-10 Codes Code Description [id number] Unspecified open wound, left lower leg, subsequent encounter S91.002D Unspecified open wound, left ankle, subsequent  encounter M32.9 Systemic lupus erythematosus, unspecified Facility Procedures CPT4 Code: [id number] Description: [phone] - WOUND CARE VISIT-LEV 5 EST PT Modifier: Quantity: 1 Physician Procedures : CPT4 Code Description Modifier [id number] - WC PHYS LEVEL 3 - EST PT ICD-10 Diagnosis Description S81.802D Unspecified open wound, left lower leg, subsequent encounter S91.002D Unspecified open wound, left ankle, subsequent encounter M32.9 Systemic  lupus erythematosus, unspecified Quantity: 1 Electronic Signature(s) Signed: 06/02/2021 12:29:09 PM By: Kelly Corwin DO Entered By: Kelly [name] on 06/02/2021 12:28:23

## 2021-06-06 NOTE — Progress Notes (Signed)
SHAYANA, HORNSTEIN ([id number]) Visit Report for 06/02/2021 Arrival Information Details Patient Name: Date of Service: STARLA, DELLER. 06/02/2021 10:30 A M Medical Record Number: [id number] Patient Account Number: [id number] Date of Birth/Sex: Treating RN: 1996/07/24 (25 y.o. [name] Primary Care Nailah Luepke: Dione Booze Other Clinician: Referring Orla Estrin: Treating Eletha Culbertson/Extender: Gennaro Africa in Treatment: 15 Visit Information History Since Last Visit Added or deleted any medications: No Patient Arrived: Knee Scooter Any new allergies or adverse reactions: No Arrival Time: 10:59 Had a fall or experienced change in No Accompanied By: husband activities of daily living that may affect Transfer Assistance: None risk of falls: Patient Identification Verified: Yes Signs or symptoms of abuse/neglect since last visito No Secondary Verification Process Completed: Yes Hospitalized since last visit: No Patient Requires Transmission-Based Precautions: No Implantable device outside of the clinic excluding No Patient Has Alerts: Yes cellular tissue based products placed in the center Patient Alerts: Left ABi=1.18 since last visit: Has Dressing in Place as Prescribed: Yes Pain Present Now: Yes Electronic Signature(s) Signed: 06/05/2021 4:58:30 PM By: Zandra Abts RN, BSN Entered By: Zandra Abts on 06/02/2021 11:00:18 -------------------------------------------------------------------------------- Clinic Level of Care Assessment Details Patient Name: Date of Service: MARILOU, BARNFIELD [name] 06/02/2021 10:30 A M Medical Record Number: [id number] Patient Account Number: [id number] Date of Birth/Sex: Treating RN: 07-Aug-1996 (25 y.o. [name], [name] Primary Care Maksymilian Mabey: Dione Booze Other Clinician: Referring Brittannie Tawney: Treating Noa Constante/Extender: Gennaro Africa in Treatment: 15 Clinic Level of Care  Assessment Items TOOL 4 Quantity Score X- 1 0 Use when only an EandM is performed on FOLLOW-UP visit ASSESSMENTS - Nursing Assessment / Reassessment X- 1 10 Reassessment of Co-morbidities (includes updates in patient status) X- 1 5 Reassessment of Adherence to Treatment Plan ASSESSMENTS - Wound and Skin A ssessment / Reassessment X - Simple Wound Assessment / Reassessment - one wound 1 5  - 0 Complex Wound Assessment / Reassessment - multiple wounds X- 1 10 Dermatologic / Skin Assessment (not related to wound area) ASSESSMENTS - Focused Assessment X- 1 5 Circumferential Edema Measurements - multi extremities  - 0 Nutritional Assessment / Counseling / Intervention  - 0 Lower Extremity Assessment (monofilament, tuning fork, pulses)  - 0 Peripheral Arterial Disease Assessment (using hand held doppler) ASSESSMENTS - Ostomy and/or Continence Assessment and Care  - 0 Incontinence Assessment and Management  - 0 Ostomy Care Assessment and Management (repouching, etc.) PROCESS - Coordination of Care X - Simple Patient / Family Education for ongoing care 1 15  - 0 Complex (extensive) Patient / Family Education for ongoing care X- 1 10 Staff obtains Chiropractor, Records, T Results / Process Orders est  - 0 Staff telephones HHA, Nursing Homes / Clarify orders / etc  - 0 Routine Transfer to another Facility (non-emergent condition)  - 0 Routine Hospital Admission (non-emergent condition)  - 0 New Admissions / Manufacturing engineer / Ordering NPWT Apligraf, etc. ,  - 0 Emergency Hospital Admission (emergent condition) X- 1 10 Simple Discharge Coordination  - 0 Complex (extensive) Discharge Coordination PROCESS - Special Needs  - 0 Pediatric / Minor Patient Management  - 0 Isolation Patient Management  - 0 Hearing / Language / Visual special needs  - 0 Assessment of Community assistance (transportation, D/C planning, etc.)  -  0 Additional assistance / Altered mentation  - 0 Support Surface(s) Assessment (bed, cushion, seat, etc.) INTERVENTIONS - Wound Cleansing / Measurement  - 0 Simple Wound Cleansing - one wound X-  5 5 Complex Wound Cleansing - multiple wounds X- 1 5 Wound Imaging (photographs - any number of wounds) []  - 0 Wound Tracing (instead of photographs) []  - 0 Simple Wound Measurement - one wound X- 5 5 Complex Wound Measurement - multiple wounds INTERVENTIONS - Wound Dressings []  - 0 Small Wound Dressing one or multiple wounds X- 5 15 Medium Wound Dressing one or multiple wounds []  - 0 Large Wound Dressing one or multiple wounds []  - 0 Application of Medications - topical []  - 0 Application of Medications - injection INTERVENTIONS - Miscellaneous []  - 0 External ear exam []  - 0 Specimen Collection (cultures, biopsies, blood, body fluids, etc.) []  - 0 Specimen(s) / Culture(s) sent or taken to Lab for analysis []  - 0 Patient Transfer (multiple staff / / Similar devices) []  - 0 Simple Staple / Suture removal (25 or less) []  - 0 Complex Staple / Suture removal (26 or more) []  - 0 Hypo / Hyperglycemic Management (close monitor of Blood Glucose) []  - 0 Ankle / Brachial Index (ABI) - do not check if billed separately X- 1 5 Vital Signs Has the patient been seen at the hospital within the last three years: Yes Total Score: 205 Level Of Care: New/Established - Level 5 Electronic Signature(s) Signed: 06/06/2021 5:02:53 PM By: RN Entered By: on 06/02/2021 11:58:02 -------------------------------------------------------------------------------- Encounter Discharge Information Details Patient Name: Date of Service: [name] 06/02/2021 10:30 A M Medical Record Number: Patient Account Number: Date of Birth/Sex: Treating RN: [birth year] (25 y.o. Nurse, adult, [name] Primary Care Kashius Dominic: Other Clinician: Referring Kaytelyn Glore: Treating Nikky Duba/Extender: in Treatment: 15 Encounter Discharge Information Items Discharge Condition: Stable Ambulatory Status: Knee Scooter Discharge Destination: Home Transportation: Private Auto Accompanied By: husband Schedule Follow-up Appointment: Yes Clinical Summary of Care: Patient Declined Electronic Signature(s) Signed: 06/06/2021 5:02:53 PM By: RN Entered By: 08/06/2021 on 06/02/2021 11:33:12 -------------------------------------------------------------------------------- Lower Extremity Assessment Details Patient Name: Date of Service: Fonnie Mu RIA 08/02/2021. 06/02/2021 10:30 A M Medical Record Number: 08/02/2021 Patient Account Number: [id number] Date of Birth/Sex: Treating RN: 1996-07-19 (25 y.o. 03/19/1996 Primary Care Allin Frix: [name] Other Clinician: Referring Vinisha Faxon: Treating Christos Mixson/Extender: Dione Booze in Treatment: 15 Edema Assessment Assessed: Gennaro Africa: Yes] 08/06/2021: No] Edema: [Left: Ye] [Right: s] Calf Left: Right: Point of Measurement: 27 cm From Medial Instep 32 cm Ankle Left: Right: Point of Measurement: 8 cm From Medial Instep 22 cm Vascular Assessment Pulses: Dorsalis Pedis Palpable: [Left:Yes] Posterior Tibial Palpable: [Left:Yes] Electronic Signature(s) Signed: 06/05/2021 4:58:30 PM By: Fonnie Mu RN, BSN Entered By: 08/02/2021 on 06/02/2021 11:01:19 -------------------------------------------------------------------------------- Multi Wound Chart Details Patient Name: Date of Service: Cherly Anderson [name] 06/02/2021 10:30 A M Medical Record Number: [id number] Patient Account Number: [id number] Date of Birth/Sex: Treating RN: Jul 30, 1996 (25 y.o. [name], [name] Primary Care Sarrah Fiorenza: Dione Booze Other Clinician: Referring Melvin Marmo: Treating Merry Pond/Extender:  Gennaro Africa in Treatment: 15 Vital Signs Height(in): 62 Pulse(bpm): 86 Weight(lbs): Blood Pressure(mmHg): 124/83 Body Mass Index(BMI): Temperature(F): 98.2 Respiratory Rate(breaths/min): 17 Photos: Left, Posterior Lower Leg Left, Lateral Ankle Left, Anterior Ankle Wound Location: Gradually Appeared Gradually Appeared Gradually Appeared Wounding Event: Cellulitis Cellulitis Cellulitis Primary Etiology: N/A Lupus Lupus Secondary Etiology: Asthma, Lupus Erythematosus Asthma, Lupus Erythematosus Asthma, Lupus Erythematosus Comorbid History: 04/14/2021 04/14/2021 04/14/2021 Date Acquired: 6 6 6  Weeks of Treatment: Open Open Open Wound Status: 1.2x1.5x0.7  5.5x5x0.3 2.2x1.7x0.5 Measurements L x W x D (cm) 1.414 21.598 2.937 A (cm) : rea 0.99 6.48 1.469 Volume (cm) : -15.40% -37.50% -211.80% % Reduction in A rea: 32.70% -3.10% -681.40% % Reduction in Volume: Full Thickness With Exposed Support Full Thickness Without Exposed Full Thickness Without Exposed Classification: Structures Support Structures Support Structures Medium Medium Medium Exudate A mount: Serosanguineous Serosanguineous Serosanguineous Exudate Type: red, brown red, brown red, brown Exudate Color: Distinct, outline attached Distinct, outline attached Distinct, outline attached Wound Margin: Large (67-100%) Small (1-33%) None Present (0%) Granulation Amount: Red, Pink Red, Pink N/A Granulation Quality: Small (1-33%) Large (67-100%) Large (67-100%) Necrotic Amount: N/A Eschar Eschar Necrotic Tissue: Fat Layer (Subcutaneous Tissue): Yes Fat Layer (Subcutaneous Tissue): Yes Fat Layer (Subcutaneous Tissue): Yes Exposed Structures: Fascia: No Fascia: No Fascia: No Tendon: No Tendon: No Tendon: No Muscle: No Muscle: No Muscle: No Joint: No Joint: No Joint: No Bone: No Bone: No Bone: No None None None Epithelialization: Wound Number: 6 7 N/A Photos: N/A Left,  Distal, Posterior Lower Leg Left, Anterior Lower Leg N/A Wound Location: Gradually Appeared Other Lesion N/A Wounding Event: Lupus Lupus N/A Primary Etiology: N/A N/A N/A Secondary Etiology: Asthma, Lupus Erythematosus Asthma, Lupus Erythematosus N/A Comorbid History: 04/14/2021 05/18/2021 N/A Date Acquired: 6 2 N/A Weeks of Treatment: Open Open N/A Wound Status: 1x0.7x0.5 1.5x1x0.7 N/A Measurements L x W x D (cm) 0.55 1.178 N/A A (cm) : rea 0.275 0.825 N/A Volume (cm) : 12.40% 3.80% N/A % Reduction in A rea: -118.30% -124.20% N/A % Reduction in Volume: Full Thickness Without Exposed Full Thickness Without Exposed N/A Classification: Support Structures Support Structures Medium Medium N/A Exudate A mount: Serosanguineous Purulent N/A Exudate Type: red, brown yellow, brown, green N/A Exudate Color: Distinct, outline attached Well defined, not attached N/A Wound Margin: Small (1-33%) Small (1-33%) N/A Granulation Amount: Red Red N/A Granulation Quality: Large (67-100%) Large (67-100%) N/A Necrotic Amount: Eschar N/A N/A Necrotic Tissue: Fascia: No Fat Layer (Subcutaneous Tissue): Yes N/A Exposed Structures: Fat Layer (Subcutaneous Tissue): No Fascia: No Tendon: No Tendon: No Muscle: No Muscle: No Joint: No Joint: No Bone: No Bone: No Medium (34-66%) None N/A Epithelialization: Treatment Notes Wound #3 (Lower Leg) Wound Laterality: Left, Posterior Cleanser Soap and Water Discharge Instruction: May shower and wash wound with dial antibacterial soap and water prior to dressing change. Peri-Wound Care Topical Primary Dressing Promogran Prisma Matrix, 4.34 (sq in) (silver collagen) Discharge Instruction: Moisten collagen with saline or hydrogel Secondary Dressing Woven Gauze Sponge, Non-Sterile 4x4 in Discharge Instruction: Apply over primary dressing as directed. Secured With [employer], 4.5x3.1 (in/yd) Discharge Instruction: Secure with  Kerlix as directed. Paper Tape, 2x10 (in/yd) Discharge Instruction: Secure dressing with tape as directed. Compression Wrap Compression Stockings Add-Ons Wound #4 (Ankle) Wound Laterality: Left, Lateral Cleanser Soap and Water Discharge Instruction: May shower and wash wound with dial antibacterial soap and water prior to dressing change. Peri-Wound Care Topical Primary Dressing Santyl Ointment Discharge Instruction: Apply nickel thick amount to wound bed as instructed Secondary Dressing Woven Gauze Sponge, Non-Sterile 4x4 in Discharge Instruction: Apply over primary dressing as directed. Secured With [employer], 4.5x3.1 (in/yd) Discharge Instruction: Secure with Kerlix as directed. Paper Tape, 2x10 (in/yd) Discharge Instruction: Secure dressing with tape as directed. Compression Wrap Compression Stockings Add-Ons Wound #5 (Ankle) Wound Laterality: Left, Anterior Cleanser Soap and Water Discharge Instruction: May shower and wash wound with dial antibacterial soap and water prior to dressing change. Peri-Wound Care Topical Primary Dressing Santyl Ointment Discharge  Instruction: Apply nickel thick amount to wound bed as instructed Secondary Dressing Woven Gauze Sponge, Non-Sterile 4x4 in Discharge Instruction: Apply over primary dressing as directed. Secured With [employer], 4.5x3.1 (in/yd) Discharge Instruction: Secure with Kerlix as directed. Paper Tape, 2x10 (in/yd) Discharge Instruction: Secure dressing with tape as directed. Compression Wrap Compression Stockings Add-Ons Wound #6 (Lower Leg) Wound Laterality: Left, Posterior, Distal Cleanser Soap and Water Discharge Instruction: May shower and wash wound with dial antibacterial soap and water prior to dressing change. Peri-Wound Care Topical Primary Dressing Santyl Ointment Discharge Instruction: Apply nickel thick amount to wound bed as instructed Secondary Dressing Woven Gauze Sponge,  Non-Sterile 4x4 in Discharge Instruction: Apply over primary dressing as directed. Secured With [employer], 4.5x3.1 (in/yd) Discharge Instruction: Secure with Kerlix as directed. Paper Tape, 2x10 (in/yd) Discharge Instruction: Secure dressing with tape as directed. Compression Wrap Compression Stockings Add-Ons Wound #7 (Lower Leg) Wound Laterality: Left, Anterior Cleanser Soap and Water Discharge Instruction: May shower and wash wound with dial antibacterial soap and water prior to dressing change. Peri-Wound Care Topical Primary Dressing Santyl Ointment Discharge Instruction: Apply nickel thick amount to wound bed as instructed Secondary Dressing Woven Gauze Sponge, Non-Sterile 4x4 in Discharge Instruction: Apply over primary dressing as directed. Secured With [employer], 4.5x3.1 (in/yd) Discharge Instruction: Secure with Kerlix as directed. Paper Tape, 2x10 (in/yd) Discharge Instruction: Secure dressing with tape as directed. Compression Wrap Compression Stockings Add-Ons Electronic Signature(s) Signed: 06/02/2021 12:29:09 PM By: Geralyn Corwin DO Signed: 06/02/2021 12:34:01 PM By: Zenaida Deed RN, BSN Entered By: Geralyn Corwin on 06/02/2021 12:09:19 -------------------------------------------------------------------------------- Multi-Disciplinary Care Plan Details Patient Name: Date of Service: [name] 06/02/2021 10:30 A M Medical Record Number: [id number] Patient Account Number: [id number] Date of Birth/Sex: Treating RN: Jul 16, 1996 (25 y.o. [name], [name] Primary Care Ismail Graziani: Dione Booze Other Clinician: Referring Shatonya Passon: Treating Sallie Maker/Extender: Gennaro Africa in Treatment: 15 Multidisciplinary Care Plan reviewed with physician Active Inactive Abuse / Safety / Falls / Self Care Management Nursing Diagnoses: History of Falls Potential for falls Goals: Patient/caregiver will  verbalize/demonstrate measures taken to prevent injury and/or falls Date Initiated: 04/21/2021 Target Resolution Date: 05/26/2021 Goal Status: Active Interventions: Assess fall risk on admission and as needed Assess impairment of mobility on admission and as needed per policy Notes: Wound/Skin Impairment Nursing Diagnoses: Impaired tissue integrity Goals: Patient/caregiver will verbalize understanding of skin care regimen Date Initiated: 02/13/2021 Target Resolution Date: 05/26/2021 Goal Status: Active Ulcer/skin breakdown will have a volume reduction of 30% by week 4 Date Initiated: 02/13/2021 Date Inactivated: 04/21/2021 Target Resolution Date: 03/13/2021 Unmet Reason: pt did not return to Goal Status: Unmet clinic for F/U Ulcer/skin breakdown will have a volume reduction of 80% by week 12 Date Initiated: 04/21/2021 Target Resolution Date: 06/23/2021 Goal Status: Active Interventions: Assess patient/caregiver ability to obtain necessary supplies Assess patient/caregiver ability to perform ulcer/skin care regimen upon admission and as needed Assess ulceration(s) every visit Provide education on ulcer and skin care Treatment Activities: Skin care regimen initiated : 02/13/2021 Topical wound management initiated : 02/13/2021 Notes: Electronic Signature(s) Signed: 06/06/2021 5:02:53 PM By: Fonnie Mu RN Entered By: Fonnie Mu on 06/02/2021 11:32:07 -------------------------------------------------------------------------------- Pain Assessment Details Patient Name: Date of Service: [name] 06/02/2021 10:30 A M Medical Record Number: [id number] Patient Account Number: [id number] Date of Birth/Sex: Treating RN: 11-Mar-1996 (25 y.o. [name] Primary Care Asaph Serena: Dione Booze Other Clinician: Referring Aerika Groll: Treating Kasondra Junod/Extender: Gennaro Africa in Treatment:  15 Active Problems Location of Pain Severity  and Description of Pain Patient Has Paino Yes Site Locations Pain Location: Pain Location: Pain in Ulcers With Dressing Change: Yes Duration of the Pain. Constant / Intermittento Intermittent Rate the pain. Current Pain Level: 7 Worst Pain Level: 7 Least Pain Level: 0 Tolerable Pain Level: 7 Character of Pain Describe the Pain: Aching Pain Management and Medication Current Pain Management: Medication: Yes Cold Application: No Rest: Yes Massage: No Activity: No T.E.N.S.: No Heat Application: No Leg drop or elevation: No Is the Current Pain Management Adequate: Adequate How does your wound impact your activities of daily livingo Sleep: No Bathing: No Appetite: No Relationship With Others: No Bladder Continence: No Emotions: No Bowel Continence: No Work: No Toileting: No Drive: No Dressing: No Hobbies: No Electronic Signature(s) Signed: 06/05/2021 4:58:30 PM By: Zandra Abts RN, BSN Entered By: Zandra Abts on 06/02/2021 11:01:12 -------------------------------------------------------------------------------- Patient/Caregiver Education Details Patient Name: Date of Service: Babette Relic 10/7/2022andnbsp10:30 A M Medical Record Number: [id number] Patient Account Number: [id number] Date of Birth/Gender: Treating RN: [birth year] (25 y.o. [name], [name] Primary Care Physician: Dione Booze Other Clinician: Referring Physician: Treating Physician/Extender: Gennaro Africa in Treatment: 15 Education Assessment Education Provided To: Patient Education Topics Provided Wound/Skin Impairment: Methods: Explain/Verbal Responses: Reinforcements needed, State content correctly [employer]) Signed: 06/06/2021 5:02:53 PM By: Fonnie Mu RN Entered By: Fonnie Mu on 06/02/2021 11:32:21 -------------------------------------------------------------------------------- Wound Assessment Details Patient  Name: Date of Service: [name] 06/02/2021 10:30 A M Medical Record Number: [id number] Patient Account Number: [id number] Date of Birth/Sex: Treating RN: 1996/03/18 (25 y.o. [name], [name] Primary Care Jazlen Ogarro: Dione Booze Other Clinician: Referring Sherrise Liberto: Treating Raphaela Cannaday/Extender: Gennaro Africa in Treatment: 15 Wound Status Wound Number: 3 Primary Etiology: Cellulitis Wound Location: Left, Posterior Lower Leg Wound Status: Open Wounding Event: Gradually Appeared Comorbid History: Asthma, Lupus Erythematosus Date Acquired: 04/14/2021 Weeks Of Treatment: 6 Clustered Wound: No Photos Wound Measurements Length: (cm) 1.2 Width: (cm) 1.5 Depth: (cm) 0.7 Area: (cm) 1.414 Volume: (cm) 0.99 % Reduction in Area: -15.4% % Reduction in Volume: 32.7% Epithelialization: None Wound Description Classification: Full Thickness With Exposed Support Structures Wound Margin: Distinct, outline attached Exudate Amount: Medium Exudate Type: Serosanguineous Exudate Color: red, brown Foul Odor After Cleansing: No Slough/Fibrino Yes Wound Bed Granulation Amount: Large (67-100%) Exposed Structure Granulation Quality: Red, Pink Fascia Exposed: No Necrotic Amount: Small (1-33%) Fat Layer (Subcutaneous Tissue) Exposed: Yes Tendon Exposed: No Muscle Exposed: No Joint Exposed: No Bone Exposed: No Treatment Notes Wound #3 (Lower Leg) Wound Laterality: Left, Posterior Cleanser Soap and Water Discharge Instruction: May shower and wash wound with dial antibacterial soap and water prior to dressing change. Peri-Wound Care Topical Primary Dressing Promogran Prisma Matrix, 4.34 (sq in) (silver collagen) Discharge Instruction: Moisten collagen with saline or hydrogel Secondary Dressing Woven Gauze Sponge, Non-Sterile 4x4 in Discharge Instruction: Apply over primary dressing as directed. Secured With [employer], 4.5x3.1  (in/yd) Discharge Instruction: Secure with Kerlix as directed. Paper Tape, 2x10 (in/yd) Discharge Instruction: Secure dressing with tape as directed. Compression Wrap Compression Stockings Add-Ons Electronic Signature(s) Signed: 06/06/2021 5:02:53 PM By: Fonnie Mu RN Entered By: Fonnie Mu on 06/02/2021 11:22:34 -------------------------------------------------------------------------------- Wound Assessment Details Patient Name: Date of Service: [name] 06/02/2021 10:30 A M Medical Record Number: [id number] Patient Account Number: [id number] Date of Birth/Sex: Treating RN: 28-Nov-1995 (25 y.o. [name], [name] Primary Care Damarion Mendizabal: Dione Booze Other Clinician: Referring Latajah Thuman: Treating  Mohsen Odenthal/Extender: Gennaro Africa in Treatment: 15 Wound Status Wound Number: 4 Primary Etiology: Cellulitis Wound Location: Left, Lateral Ankle Secondary Etiology: Lupus Wounding Event: Gradually Appeared Wound Status: Open Date Acquired: 04/14/2021 Comorbid History: Asthma, Lupus Erythematosus Weeks Of Treatment: 6 Clustered Wound: No Photos Wound Measurements Length: (cm) 5.5 Width: (cm) 5 Depth: (cm) 0.3 Area: (cm) 21.598 Volume: (cm) 6.48 % Reduction in Area: -37.5% % Reduction in Volume: -3.1% Epithelialization: None Wound Description Classification: Full Thickness Without Exposed Support Structures Wound Margin: Distinct, outline attached Exudate Amount: Medium Exudate Type: Serosanguineous Exudate Color: red, brown Foul Odor After Cleansing: No Slough/Fibrino Yes Wound Bed Granulation Amount: Small (1-33%) Exposed Structure Granulation Quality: Red, Pink Fascia Exposed: No Necrotic Amount: Large (67-100%) Fat Layer (Subcutaneous Tissue) Exposed: Yes Necrotic Quality: Eschar Tendon Exposed: No Muscle Exposed: No Joint Exposed: No Bone Exposed: No Treatment Notes Wound #4 (Ankle) Wound Laterality:  Left, Lateral Cleanser Soap and Water Discharge Instruction: May shower and wash wound with dial antibacterial soap and water prior to dressing change. Peri-Wound Care Topical Primary Dressing Santyl Ointment Discharge Instruction: Apply nickel thick amount to wound bed as instructed Secondary Dressing Woven Gauze Sponge, Non-Sterile 4x4 in Discharge Instruction: Apply over primary dressing as directed. Secured With [employer], 4.5x3.1 (in/yd) Discharge Instruction: Secure with Kerlix as directed. Paper Tape, 2x10 (in/yd) Discharge Instruction: Secure dressing with tape as directed. Compression Wrap Compression Stockings Add-Ons Electronic Signature(s) Signed: 06/06/2021 5:02:53 PM By: Fonnie Mu RN Entered By: Fonnie Mu on 06/02/2021 11:22:34 -------------------------------------------------------------------------------- Wound Assessment Details Patient Name: Date of Service: [name] 06/02/2021 10:30 A M Medical Record Number: [id number] Patient Account Number: [id number] Date of Birth/Sex: Treating RN: Jan 06, 1996 (25 y.o. [name], [name] Primary Care Ellissa Ayo: Dione Booze Other Clinician: Referring Adalynn Corne: Treating Richell Corker/Extender: Gennaro Africa in Treatment: 15 Wound Status Wound Number: 5 Primary Etiology: Cellulitis Wound Location: Left, Anterior Ankle Secondary Etiology: Lupus Wounding Event: Gradually Appeared Wound Status: Open Date Acquired: 04/14/2021 Comorbid History: Asthma, Lupus Erythematosus Weeks Of Treatment: 6 Clustered Wound: No Photos Wound Measurements Length: (cm) 2.2 Width: (cm) 1.7 Depth: (cm) 0.5 Area: (cm) 2.937 Volume: (cm) 1.469 % Reduction in Area: -211.8% % Reduction in Volume: -681.4% Epithelialization: None Wound Description Classification: Full Thickness Without Exposed Support Structures Wound Margin: Distinct, outline attached Exudate Amount:  Medium Exudate Type: Serosanguineous Exudate Color: red, brown Foul Odor After Cleansing: No Slough/Fibrino Yes Wound Bed Granulation Amount: None Present (0%) Exposed Structure Necrotic Amount: Large (67-100%) Fascia Exposed: No Necrotic Quality: Eschar Fat Layer (Subcutaneous Tissue) Exposed: Yes Tendon Exposed: No Muscle Exposed: No Joint Exposed: No Bone Exposed: No Treatment Notes Wound #5 (Ankle) Wound Laterality: Left, Anterior Cleanser Soap and Water Discharge Instruction: May shower and wash wound with dial antibacterial soap and water prior to dressing change. Peri-Wound Care Topical Primary Dressing Santyl Ointment Discharge Instruction: Apply nickel thick amount to wound bed as instructed Secondary Dressing Woven Gauze Sponge, Non-Sterile 4x4 in Discharge Instruction: Apply over primary dressing as directed. Secured With [employer], 4.5x3.1 (in/yd) Discharge Instruction: Secure with Kerlix as directed. Paper Tape, 2x10 (in/yd) Discharge Instruction: Secure dressing with tape as directed. Compression Wrap Compression Stockings Add-Ons Electronic Signature(s) Signed: 06/06/2021 5:02:53 PM By: Fonnie Mu RN Entered By: Fonnie Mu on 06/02/2021 11:22:34 -------------------------------------------------------------------------------- Wound Assessment Details Patient Name: Date of Service: [name] 06/02/2021 10:30 A M Medical Record Number: [id number] Patient Account Number: [id number] Date of Birth/Sex: Treating RN: Jul 18, 1996 (25 y.o.  [name], [name] Primary Care Sedric Guia: Dione Booze Other Clinician: Referring Zeina Akkerman: Treating Trent Theisen/Extender: Gennaro [place] in Treatment: 15 Wound Status Wound Number: 6 Primary Etiology: Lupus Wound Location: Left, Distal, Posterior Lower Leg Wound Status: Open Wounding Event: Gradually Appeared Comorbid History: Asthma, Lupus  Erythematosus Date Acquired: 04/14/2021 Weeks Of Treatment: 6 Clustered Wound: No Photos Wound Measurements Length: (cm) 1 Width: (cm) 0.7 Depth: (cm) 0.5 Area: (cm) 0.55 Volume: (cm) 0.275 % Reduction in Area: 12.4% % Reduction in Volume: -118.3% Epithelialization: Medium (34-66%) Wound Description Classification: Full Thickness Without Exposed Support Structures Wound Margin: Distinct, outline attached Exudate Amount: Medium Exudate Type: Serosanguineous Exudate Color: red, brown Foul Odor After Cleansing: No Slough/Fibrino Yes Wound Bed Granulation Amount: Small (1-33%) Exposed Structure Granulation Quality: Red Fascia Exposed: No Necrotic Amount: Large (67-100%) Fat Layer (Subcutaneous Tissue) Exposed: No Necrotic Quality: Eschar Tendon Exposed: No Muscle Exposed: No Joint Exposed: No Bone Exposed: No Treatment Notes Wound #6 (Lower Leg) Wound Laterality: Left, Posterior, Distal Cleanser Soap and Water Discharge Instruction: May shower and wash wound with dial antibacterial soap and water prior to dressing change. Peri-Wound Care Topical Primary Dressing Santyl Ointment Discharge Instruction: Apply nickel thick amount to wound bed as instructed Secondary Dressing Woven Gauze Sponge, Non-Sterile 4x4 in Discharge Instruction: Apply over primary dressing as directed. Secured With [employer], 4.5x3.1 (in/yd) Discharge Instruction: Secure with Kerlix as directed. Paper Tape, 2x10 (in/yd) Discharge Instruction: Secure dressing with tape as directed. Compression Wrap Compression Stockings Add-Ons Electronic Signature(s) Signed: 06/06/2021 5:02:53 PM By: Fonnie Mu RN Entered By: Fonnie Mu on 06/02/2021 11:22:35 -------------------------------------------------------------------------------- Wound Assessment Details Patient Name: Date of Service: [name] 06/02/2021 10:30 A M Medical Record Number: [id number] Patient  Account Number: [id number] Date of Birth/Sex: Treating RN: [birth year] (25 y.o. [name], [name] Primary Care Caralynn Gelber: Dione Booze Other Clinician: Referring Atlantis Delong: Treating Helayne Metsker/Extender: Gennaro [place] in Treatment: 15 Wound Status Wound Number: 7 Primary Etiology: Lupus Wound Location: Left, Anterior Lower Leg Wound Status: Open Wounding Event: Other Lesion Comorbid History: Asthma, Lupus Erythematosus Date Acquired: 05/18/2021 Weeks Of Treatment: 2 Clustered Wound: No Photos Wound Measurements Length: (cm) 1.5 Width: (cm) 1 Depth: (cm) 0.7 Area: (cm) 1.178 Volume: (cm) 0.825 % Reduction in Area: 3.8% % Reduction in Volume: -124.2% Epithelialization: None Wound Description Classification: Full Thickness Without Exposed Support Structures Wound Margin: Well defined, not attached Exudate Amount: Medium Exudate Type: Purulent Exudate Color: yellow, brown, green Wound Bed Granulation Amount: Small (1-33%) Granulation Quality: Red Necrotic Amount: Large (67-100%) Foul Odor After Cleansing: No Slough/Fibrino Yes Exposed Structure Fascia Exposed: No Fat Layer (Subcutaneous Tissue) Exposed: Yes Tendon Exposed: No Muscle Exposed: No Joint Exposed: No Bone Exposed: No Treatment Notes Wound #7 (Lower Leg) Wound Laterality: Left, Anterior Cleanser Soap and Water Discharge Instruction: May shower and wash wound with dial antibacterial soap and water prior to dressing change. Peri-Wound Care Topical Primary Dressing Santyl Ointment Discharge Instruction: Apply nickel thick amount to wound bed as instructed Secondary Dressing Woven Gauze Sponge, Non-Sterile 4x4 in Discharge Instruction: Apply over primary dressing as directed. Secured With [employer], 4.5x3.1 (in/yd) Discharge Instruction: Secure with Kerlix as directed. Paper Tape, 2x10 (in/yd) Discharge Instruction: Secure dressing with tape as  directed. Compression Wrap Compression Stockings Add-Ons Electronic Signature(s) Signed: 06/06/2021 5:02:53 PM By: Fonnie Mu RN Entered By: Fonnie Mu on 06/02/2021 11:22:35 -------------------------------------------------------------------------------- Vitals Details Patient Name: Date of Service: [name] 06/02/2021 10:30 A M Medical Record  Number: [id number] Patient Account Number: [id number] Date of Birth/Sex: Treating RN: 10/11/1995 (25 y.o. Dorthula Perfect, Emeterio Reeve Primary Care Buelah Rennie: Dione Booze Other Clinician: Referring Vilas Edgerly: Treating Kama Cammarano/Extender: Gennaro [place] in Treatment: 15 Vital Signs Time Taken: 11:04 Temperature (F): 98.2 Height (in): 62 Pulse (bpm): 86 Respiratory Rate (breaths/min): 17 Blood Pressure (mmHg): 124/83 Reference Range: 80 - 120 mg / dl Electronic Signature(s) Signed: 06/05/2021 4:58:30 PM By: Zandra Abts RN, BSN Entered By: Zandra Abts on 06/02/2021 11:05:11

## 2021-06-07 DIAGNOSIS — S86011D Strain of right Achilles tendon, subsequent encounter: Secondary | ICD-10-CM | POA: Diagnosis not present

## 2021-06-15 DIAGNOSIS — S86011D Strain of right Achilles tendon, subsequent encounter: Secondary | ICD-10-CM | POA: Diagnosis not present

## 2021-06-16 ENCOUNTER — Other Ambulatory Visit: Payer: Self-pay

## 2021-06-16 ENCOUNTER — Encounter (HOSPITAL_BASED_OUTPATIENT_CLINIC_OR_DEPARTMENT_OTHER): Payer: BC Managed Care – PPO | Admitting: Internal Medicine

## 2021-06-16 DIAGNOSIS — M329 Systemic lupus erythematosus, unspecified: Secondary | ICD-10-CM

## 2021-06-16 DIAGNOSIS — S81802D Unspecified open wound, left lower leg, subsequent encounter: Secondary | ICD-10-CM | POA: Diagnosis not present

## 2021-06-16 DIAGNOSIS — J45909 Unspecified asthma, uncomplicated: Secondary | ICD-10-CM | POA: Diagnosis not present

## 2021-06-16 DIAGNOSIS — S91002D Unspecified open wound, left ankle, subsequent encounter: Secondary | ICD-10-CM | POA: Diagnosis not present

## 2021-06-16 DIAGNOSIS — X58XXXD Exposure to other specified factors, subsequent encounter: Secondary | ICD-10-CM | POA: Diagnosis not present

## 2021-06-19 DIAGNOSIS — S81802D Unspecified open wound, left lower leg, subsequent encounter: Secondary | ICD-10-CM | POA: Diagnosis not present

## 2021-06-19 DIAGNOSIS — S81002D Unspecified open wound, left knee, subsequent encounter: Secondary | ICD-10-CM | POA: Diagnosis not present

## 2021-06-19 DIAGNOSIS — M329 Systemic lupus erythematosus, unspecified: Secondary | ICD-10-CM | POA: Diagnosis not present

## 2021-06-20 NOTE — Progress Notes (Signed)
Kelly [name], Kelly [name] ([id number]) Visit Report for 06/16/2021 Arrival Information Details Patient Name: Date of Service: Kelly [name], Kelly [name]. 06/16/2021 10:30 A M Medical Record Number: [id number] Patient Account Number: [id number] Date of Birth/Sex: Treating RN: 1995/12/19 (25 y.o. Kelly [name] Primary Care Mireyah Chervenak: Dione Booze Other Clinician: Referring Srah Ake: Treating Vergil Burby/Extender: Gennaro [place] in Treatment: 17 Visit Information History Since Last Visit Added or deleted any medications: No Patient Arrived: Wheel Chair Any new allergies or adverse reactions: No Arrival Time: 10:39 Had a fall or experienced change in No Accompanied By: self activities of daily living that may affect Transfer Assistance: Manual risk of falls: Patient Identification Verified: Yes Signs or symptoms of abuse/neglect since last visito No Secondary Verification Process Completed: Yes Hospitalized since last visit: No Patient Requires Transmission-Based Precautions: No Implantable device outside of the clinic excluding No Patient Has Alerts: Yes cellular tissue based products placed in the center Patient Alerts: Left ABi=1.18 since last visit: Has Dressing in Place as Prescribed: Yes Pain Present Now: Yes Electronic Signature(s) Signed: 06/20/2021 2:37:05 PM By: Karl Ito Entered By: Karl Ito on 06/16/2021 10:40:16 -------------------------------------------------------------------------------- Encounter Discharge Information Details Patient Name: Date of Service: Kelly [name] Kelly H M. 06/16/2021 10:30 A M Medical Record Number: [id number] Patient Account Number: [id number] Date of Birth/Sex: Treating RN: 15-Sep-1995 (25 y.o. Kelly [name], Kelly [name] Primary Care Marybell Robards: Dione Booze Other Clinician: Referring Ruthe Roemer: Treating Mirra Basilio/Extender: Gennaro Africa in Treatment: 17 Encounter Discharge  Information Items Discharge Condition: Stable Ambulatory Status: Wheelchair Discharge Destination: Home Transportation: Private Auto Accompanied By: self Schedule Follow-up Appointment: Yes Clinical Summary of Care: Patient Declined Electronic Signature(s) Signed: 06/20/2021 5:23:14 PM By: Fonnie Mu RN Entered By: Fonnie Mu on 06/16/2021 11:51:05 -------------------------------------------------------------------------------- Lower Extremity Assessment Details Patient Name: Date of Service: Kelly [name] Kelly H M. 06/16/2021 10:30 A M Medical Record Number: [id number] Patient Account Number: [id number] Date of Birth/Sex: Treating RN: 1996/02/26 (25 y.o. Kelly [name] Primary Care Tria Noguera: Dione Booze Other Clinician: Referring Giulianna Rocha: Treating Aliyanna Wassmer/Extender: Gennaro [place] in Treatment: 17 Edema Assessment Assessed: Kyra Searles: No] Franne Forts: No] Edema: [Left: Ye] [Right: s] Calf Left: Right: Point of Measurement: 27 cm From Medial Instep 34 cm Ankle Left: Right: Point of Measurement: 8 cm From Medial Instep 25 cm Vascular Assessment Pulses: Dorsalis Pedis Palpable: [Left:Yes] Posterior Tibial Palpable: [Left:Yes] Electronic Signature(s) Signed: 06/19/2021 12:51:50 PM By: Zenaida Deed RN, BSN Signed: 06/20/2021 2:37:05 PM By: Karl Ito Entered By: Karl Ito on 06/16/2021 10:47:01 -------------------------------------------------------------------------------- Multi Wound Chart Details Patient Name: Date of Service: Kelly [name] Kelly H M. 06/16/2021 10:30 A M Medical Record Number: [id number] Patient Account Number: [id number] Date of Birth/Sex: Treating RN: December 18, 1995 (25 y.o. Kelly [name], Kelly [name] Primary Care Avereigh Spainhower: Dione Booze Other Clinician: Referring Anysia Choi: Treating Lexton Hidalgo/Extender: Gennaro Africa in Treatment: 17 Vital Signs Height(in): 62 Pulse(bpm):  74 Weight(lbs): Blood Pressure(mmHg): 127/70 Body Mass Index(BMI): Temperature(F): 98.7 Respiratory Rate(breaths/min): 17 Photos: [3:Left, Posterior Lower Leg] [4:Left, Lateral Ankle] [5:Left, Anterior Ankle] Wound Location: [3:Gradually Appeared] [4:Gradually Appeared] [5:Gradually Appeared] Wounding Event: [3:Cellulitis] [4:Cellulitis] [5:Cellulitis] Primary Etiology: [3:N/A] [4:Lupus] [5:Lupus] Secondary Etiology: [3:Asthma, Lupus Erythematosus] [4:Asthma, Lupus Erythematosus] [5:Asthma, Lupus Erythematosus] Comorbid History: [3:04/14/2021] [4:04/14/2021] [5:04/14/2021] Date Acquired: [3:8] [4:8] [5:8] Weeks of Treatment: [3:Open] [4:Open] [5:Open] Wound Status: [3:1x1x0.6] [4:4.5x5x0.3] [5:2x1.5x0.5] Measurements L x W x D (cm) [3:0.785] [4:17.671] [5:2.356] A (cm) : rea [3:0.471] [4:5.301] [5:1.178] Volume (cm) : [3:35.90%] [4:-12.50%] [5:-150.10%] % Reduction in A rea: [3:68.00%] [4:15.60%] [5:-526.60%] % Reduction  in Volume: [3:6] [5:12] Starting Position 1 (o'clock): [3:8] [5:1] Ending Position 1 (o'clock): [3:0.6] [5:0.5] Maximum Distance 1 (cm): [3:Yes] [4:No] [5:Yes] Undermining: [3:Full Thickness With Exposed Support Full Thickness Without Exposed] [5:Full Thickness Without Exposed] Classification: [3:Structures Medium] [4:Support Structures Medium] [5:Support Structures Medium] Exudate A mount: [3:Serosanguineous] [4:Serosanguineous] [5:Serosanguineous] Exudate Type: [3:red, brown] [4:red, brown] [5:red, brown] Exudate Color: [3:Distinct, outline attached] [4:Distinct, outline attached] [5:Distinct, outline attached] Wound Margin: [3:Large (67-100%)] [4:Small (1-33%)] [5:None Present (0%)] Granulation A mount: [3:Red, Pink] [4:Red, Pink] [5:N/A] Granulation Quality: [3:Small (1-33%)] [4:Large (67-100%)] [5:Large (67-100%)] Necrotic A mount: [3:N/A] [4:Eschar] [5:Eschar] Necrotic Tissue: [3:Fat Layer (Subcutaneous Tissue): Yes Fat Layer (Subcutaneous Tissue): Yes Fat  Layer (Subcutaneous Tissue): Yes] Exposed Structures: [3:Fascia: No Tendon: No Muscle: No Joint: No Bone: No None] [4:Fascia: No Tendon: No Muscle: No Joint: No Bone: No None] [5:Fascia: No Tendon: No Muscle: No Joint: No Bone: No None] Epithelialization: [3:N/A] [4:Debridement - Excisional] [5:N/A] Debridement: Pre-procedure Verification/Time Out N/A [4:11:34] [5:N/A] Taken: [3:N/A] [4:Lidocaine] [5:N/A] Pain Control: [3:N/A] [4:Subcutaneous, Slough] [5:N/A] Tissue Debrided: [3:N/A] [4:Skin/Subcutaneous Tissue] [5:N/A] Level: [3:N/A] [4:6.25] [5:N/A] Debridement A (sq cm): [3:rea N/A] [4:Curette] [5:N/A] Instrument: [3:N/A] [4:Minimum] [5:N/A] Bleeding: [3:N/A] [4:Pressure] [5:N/A] Hemostasis A chieved: [3:N/A] [4:0] [5:N/A] Procedural Pain: [3:N/A] [4:0] [5:N/A] Post Procedural Pain: [3:N/A] [4:Procedure was tolerated well] [5:N/A] Debridement Treatment Response: [3:N/A] [4:4.5x5x0.3] [5:N/A] Post Debridement Measurements L x W x D (cm) [3:N/A] [4:5.301] [5:N/A] Post Debridement Volume: (cm) [3:N/A] [4:Debridement] [5:N/A] Wound Number: 6 7 N/A Photos: N/A Left, Distal, Posterior Lower Leg Left, Anterior Lower Leg N/A Wound Location: Gradually Appeared Other Lesion N/A Wounding Event: Lupus Lupus N/A Primary Etiology: N/A N/A N/A Secondary Etiology: Asthma, Lupus Erythematosus Asthma, Lupus Erythematosus N/A Comorbid History: 04/14/2021 05/18/2021 N/A Date Acquired: 8 4 N/A Weeks of Treatment: Open Open N/A Wound Status: 0.9x0.7x0.4 1.8x1.3x0.5 N/A Measurements L x W x D (cm) 0.495 1.838 N/A A (cm) : rea 0.198 0.919 N/A Volume (cm) : 21.20% -50.00% N/A % Reduction in A rea: -57.10% -149.70% N/A % Reduction in Volume: 9 12 Starting Position 1 (o'clock): 12 1 Ending Position 1 (o'clock): 0.4 0.3 Maximum Distance 1 (cm): Yes Yes N/A Undermining: Full Thickness Without Exposed Full Thickness Without Exposed N/A Classification: Support Structures Support  Structures Medium Medium N/A Exudate A mount: Serosanguineous Purulent N/A Exudate Type: red, brown yellow, brown, green N/A Exudate Color: Distinct, outline attached Well defined, not attached N/A Wound Margin: Small (1-33%) Small (1-33%) N/A Granulation Amount: Red Red N/A Granulation Quality: Large (67-100%) Large (67-100%) N/A Necrotic Amount: Eschar N/A N/A Necrotic Tissue: Fascia: No Fat Layer (Subcutaneous Tissue): Yes N/A Exposed Structures: Fat Layer (Subcutaneous Tissue): No Fascia: No Tendon: No Tendon: No Muscle: No Muscle: No Joint: No Joint: No Bone: No Bone: No Medium (34-66%) None N/A Epithelialization: N/A Debridement - Excisional N/A Debridement: Pre-procedure Verification/Time Out N/A 11:34 N/A Taken: N/A Lidocaine N/A Pain Control: N/A Subcutaneous, Slough N/A Tissue Debrided: N/A Skin/Subcutaneous Tissue N/A Level: N/A 2.34 N/A Debridement A (sq cm): rea N/A Curette N/A Instrument: N/A Minimum N/A Bleeding: N/A Pressure N/A Hemostasis A chieved: N/A 0 N/A Procedural Pain: N/A 0 N/A Post Procedural Pain: N/A Procedure was tolerated well N/A Debridement Treatment Response: N/A 1.8x1.3x0.5 N/A Post Debridement Measurements L x W x D (cm) N/A 0.919 N/A Post Debridement Volume: (cm) N/A Debridement N/A Procedures Performed: Treatment Notes Wound #3 (Lower Leg) Wound Laterality: Left, Posterior Cleanser Soap and Water Discharge Instruction: May shower and wash wound with dial antibacterial soap and water prior to  dressing change. Wound Cleanser Discharge Instruction: Cleanse the wound with wound cleanser prior to applying a clean dressing using gauze sponges, not tissue or cotton balls. Peri-Wound Care Topical Primary Dressing Promogran Prisma Matrix, 4.34 (sq in) (silver collagen) Discharge Instruction: Moisten collagen with saline or hydrogel Secondary Dressing Woven Gauze Sponge, Non-Sterile 4x4 in Discharge Instruction:  Apply over primary dressing as directed. Secured With [employer], 4.5x3.1 (in/yd) Discharge Instruction: Secure with Kerlix as directed. Paper Tape, 2x10 (in/yd) Discharge Instruction: Secure dressing with tape as directed. Compression Wrap Compression Stockings Add-Ons Wound #4 (Ankle) Wound Laterality: Left, Lateral Cleanser Soap and Water Discharge Instruction: May shower and wash wound with dial antibacterial soap and water prior to dressing change. Wound Cleanser Discharge Instruction: Cleanse the wound with wound cleanser prior to applying a clean dressing using gauze sponges, not tissue or cotton balls. Peri-Wound Care Topical Primary Dressing Santyl Ointment Discharge Instruction: Apply nickel thick amount to wound bed as instructed Secondary Dressing Woven Gauze Sponge, Non-Sterile 4x4 in Discharge Instruction: Apply over primary dressing as directed. Secured With [employer], 4.5x3.1 (in/yd) Discharge Instruction: Secure with Kerlix as directed. Paper Tape, 2x10 (in/yd) Discharge Instruction: Secure dressing with tape as directed. Compression Wrap Compression Stockings Add-Ons Wound #5 (Ankle) Wound Laterality: Left, Anterior Cleanser Soap and Water Discharge Instruction: May shower and wash wound with dial antibacterial soap and water prior to dressing change. Wound Cleanser Discharge Instruction: Cleanse the wound with wound cleanser prior to applying a clean dressing using gauze sponges, not tissue or cotton balls. Peri-Wound Care Topical Primary Dressing Santyl Ointment Discharge Instruction: Apply nickel thick amount to wound bed as instructed Secondary Dressing Woven Gauze Sponge, Non-Sterile 4x4 in Discharge Instruction: Apply over primary dressing as directed. Secured With [employer], 4.5x3.1 (in/yd) Discharge Instruction: Secure with Kerlix as directed. Paper Tape, 2x10 (in/yd) Discharge Instruction: Secure dressing with  tape as directed. Compression Wrap Compression Stockings Add-Ons Wound #6 (Lower Leg) Wound Laterality: Left, Posterior, Distal Cleanser Soap and Water Discharge Instruction: May shower and wash wound with dial antibacterial soap and water prior to dressing change. Wound Cleanser Discharge Instruction: Cleanse the wound with wound cleanser prior to applying a clean dressing using gauze sponges, not tissue or cotton balls. Peri-Wound Care Topical Primary Dressing Santyl Ointment Discharge Instruction: Apply nickel thick amount to wound bed as instructed Secondary Dressing Woven Gauze Sponge, Non-Sterile 4x4 in Discharge Instruction: Apply over primary dressing as directed. Secured With [employer], 4.5x3.1 (in/yd) Discharge Instruction: Secure with Kerlix as directed. Paper Tape, 2x10 (in/yd) Discharge Instruction: Secure dressing with tape as directed. Compression Wrap Compression Stockings Add-Ons Wound #7 (Lower Leg) Wound Laterality: Left, Anterior Cleanser Soap and Water Discharge Instruction: May shower and wash wound with dial antibacterial soap and water prior to dressing change. Wound Cleanser Discharge Instruction: Cleanse the wound with wound cleanser prior to applying a clean dressing using gauze sponges, not tissue or cotton balls. Peri-Wound Care Topical Primary Dressing Santyl Ointment Discharge Instruction: Apply nickel thick amount to wound bed as instructed Secondary Dressing Woven Gauze Sponge, Non-Sterile 4x4 in Discharge Instruction: Apply over primary dressing as directed. Secured With [employer], 4.5x3.1 (in/yd) Discharge Instruction: Secure with Kerlix as directed. Paper Tape, 2x10 (in/yd) Discharge Instruction: Secure dressing with tape as directed. Compression Wrap Compression Stockings Add-Ons Electronic Signature(s) Signed: 06/16/2021 12:53:04 PM By: Geralyn Corwin DO Signed: 06/19/2021 12:51:50 PM By: Zenaida Deed RN,  BSN Entered By: Geralyn Corwin on 06/16/2021 12:45:32 -------------------------------------------------------------------------------- Multi-Disciplinary Care Plan Details Patient  Name: Date of Service: KORI, GOINS. 06/16/2021 10:30 A M Medical Record Number: [id number] Patient Account Number: [id number] Date of Birth/Sex: Treating RN: 1996/01/31 (25 y.o. Kelly [name], Kelly [name] Primary Care Tamya Denardo: Dione Booze Other Clinician: Referring Manuel Lawhead: Treating Shakemia Madera/Extender: Gennaro [place] in Treatment: 17 Multidisciplinary Care Plan reviewed with physician Active Inactive Abuse / Safety / Falls / Self Care Management Nursing Diagnoses: History of Falls Potential for falls Goals: Patient/caregiver will verbalize/demonstrate measures taken to prevent injury and/or falls Date Initiated: 04/21/2021 Target Resolution Date: 06/24/2021 Goal Status: Active Interventions: Assess fall risk on admission and as needed Assess impairment of mobility on admission and as needed per policy Notes: Wound/Skin Impairment Nursing Diagnoses: Impaired tissue integrity Goals: Patient/caregiver will verbalize understanding of skin care regimen Date Initiated: 02/13/2021 Target Resolution Date: 06/25/2021 Goal Status: Active Ulcer/skin breakdown will have a volume reduction of 30% by week 4 Date Initiated: 02/13/2021 Date Inactivated: 04/21/2021 Target Resolution Date: 03/13/2021 Unmet Reason: pt did not return to Goal Status: Unmet clinic for F/U Ulcer/skin breakdown will have a volume reduction of 80% by week 12 Date Initiated: 04/21/2021 Target Resolution Date: 06/26/2021 Goal Status: Active Interventions: Assess patient/caregiver ability to obtain necessary supplies Assess patient/caregiver ability to perform ulcer/skin care regimen upon admission and as needed Assess ulceration(s) every visit Provide education on ulcer and skin care Treatment  Activities: Skin care regimen initiated : 02/13/2021 Topical wound management initiated : 02/13/2021 Notes: Electronic Signature(s) Signed: 06/20/2021 5:23:14 PM By: Fonnie Mu RN Entered By: Fonnie Mu on 06/16/2021 11:19:10 -------------------------------------------------------------------------------- Pain Assessment Details Patient Name: Date of Service: Kelly [name] Kelly Cherly Anderson. 06/16/2021 10:30 A M Medical Record Number: [id number] Patient Account Number: [id number] Date of Birth/Sex: Treating RN: [birth year] (25 y.o. Kelly [name] Primary Care Kaevion Sinclair: Dione Booze Other Clinician: Referring Lindie Roberson: Treating Emina Ribaudo/Extender: Gennaro [place] in Treatment: 17 Active Problems Location of Pain Severity and Description of Pain Patient Has Paino Yes Site Locations Pain Location: Pain Location: Generalized Pain, Pain in Ulcers With Dressing Change: Yes Duration of the Pain. Constant / Intermittento Intermittent Rate the pain. Current Pain Level: 6 Worst Pain Level: 10 Least Pain Level: 0 Tolerable Pain Level: 6 Character of Pain Describe the Pain: Aching Pain Management and Medication Current Pain Management: Medication: Yes Cold Application: No Rest: Yes Massage: No Activity: No T.E.N.S.: No Heat Application: No Leg drop or elevation: No Is the Current Pain Management Adequate: Adequate How does your wound impact your activities of daily livingo Sleep: No Bathing: No Appetite: No Relationship With Others: No Bladder Continence: No Emotions: No Bowel Continence: No Work: No Toileting: No Drive: No Dressing: No Hobbies: No Electronic Signature(s) Signed: 06/19/2021 12:51:50 PM By: Zenaida Deed RN, BSN Signed: 06/20/2021 2:37:05 PM By: Karl Ito Entered By: Karl Ito on 06/16/2021 10:42:54 -------------------------------------------------------------------------------- Patient/Caregiver  Education Details Patient Name: Date of Service: Babette Relic 10/21/2022andnbsp10:30 A M Medical Record Number: [id number] Patient Account Number: [id number] Date of Birth/Gender: Treating RN: September 25, 1995 (25 y.o. Kelly [name], Kelly [name] Primary Care Physician: Dione Booze Other Clinician: Referring Physician: Treating Physician/Extender: Gennaro [place] in Treatment: 17 Education Assessment Education Provided To: Patient Education Topics Provided Wound/Skin Impairment: Methods: Explain/Verbal Responses: Reinforcements needed, State content correctly [employer]) Signed: 06/20/2021 5:23:14 PM By: Fonnie Mu RN Signed: 06/20/2021 5:23:14 PM By: Fonnie Mu RN Entered By: Fonnie Mu on 06/16/2021 11:19:23 -------------------------------------------------------------------------------- Wound Assessment Details Patient Name: Date of Service: Kelly [name] Kelly H M. 06/16/2021 10:30 A  M Medical Record Number: [id number] Patient Account Number: [id number] Date of Birth/Sex: Treating RN: 26-Jul-1996 (25 y.o. Kelly [name], Kelly [name] Primary Care Vishwa Dais: Dione Booze Other Clinician: Referring Raed Schalk: Treating Lawsen Arnott/Extender: Gennaro [place] in Treatment: 17 Wound Status Wound Number: 3 Primary Etiology: Cellulitis Wound Location: Left, Posterior Lower Leg Wound Status: Open Wounding Event: Gradually Appeared Comorbid History: Asthma, Lupus Erythematosus Date Acquired: 04/14/2021 Weeks Of Treatment: 8 Clustered Wound: No Photos Wound Measurements Length: (cm) 1 Width: (cm) 1 Depth: (cm) 0.6 Area: (cm) 0.785 Volume: (cm) 0.471 % Reduction in Area: 35.9% % Reduction in Volume: 68% Epithelialization: None Tunneling: No Undermining: Yes Starting Position (o'clock): 6 Ending Position (o'clock): 8 Maximum Distance: (cm) 0.6 Wound Description Classification: Full Thickness  With Exposed Support Structures Wound Margin: Distinct, outline attached Exudate Amount: Medium Exudate Type: Serosanguineous Exudate Color: red, brown Foul Odor After Cleansing: No Slough/Fibrino Yes Wound Bed Granulation Amount: Large (67-100%) Exposed Structure Granulation Quality: Red, Pink Fascia Exposed: No Necrotic Amount: Small (1-33%) Fat Layer (Subcutaneous Tissue) Exposed: Yes Tendon Exposed: No Muscle Exposed: No Joint Exposed: No Bone Exposed: No Treatment Notes Wound #3 (Lower Leg) Wound Laterality: Left, Posterior Cleanser Soap and Water Discharge Instruction: May shower and wash wound with dial antibacterial soap and water prior to dressing change. Wound Cleanser Discharge Instruction: Cleanse the wound with wound cleanser prior to applying a clean dressing using gauze sponges, not tissue or cotton balls. Peri-Wound Care Topical Primary Dressing Promogran Prisma Matrix, 4.34 (sq in) (silver collagen) Discharge Instruction: Moisten collagen with saline or hydrogel Secondary Dressing Woven Gauze Sponge, Non-Sterile 4x4 in Discharge Instruction: Apply over primary dressing as directed. Secured With [employer], 4.5x3.1 (in/yd) Discharge Instruction: Secure with Kerlix as directed. Paper Tape, 2x10 (in/yd) Discharge Instruction: Secure dressing with tape as directed. Compression Wrap Compression Stockings Add-Ons Electronic Signature(s) Signed: 06/20/2021 5:23:14 PM By: Fonnie Mu RN Entered By: Fonnie Mu on 06/16/2021 11:07:20 -------------------------------------------------------------------------------- Wound Assessment Details Patient Name: Date of Service: Kelly [name] Kelly Cherly Anderson. 06/16/2021 10:30 A M Medical Record Number: [id number] Patient Account Number: [id number] Date of Birth/Sex: Treating RN: 11/05/1995 (25 y.o. Kelly [name], Kelly [name] Primary Care Muaaz Brau: Dione Booze Other Clinician: Referring Leni Pankonin: Treating  Ralene Gasparyan/Extender: Gennaro [place] in Treatment: 17 Wound Status Wound Number: 4 Primary Etiology: Cellulitis Wound Location: Left, Lateral Ankle Secondary Etiology: Lupus Wounding Event: Gradually Appeared Wound Status: Open Date Acquired: 04/14/2021 Comorbid History: Asthma, Lupus Erythematosus Weeks Of Treatment: 8 Clustered Wound: No Photos Wound Measurements Length: (cm) 4.5 Width: (cm) 5 Depth: (cm) 0.3 Area: (cm) 17.671 Volume: (cm) 5.301 % Reduction in Area: -12.5% % Reduction in Volume: 15.6% Epithelialization: None Tunneling: No Undermining: No Wound Description Classification: Full Thickness Without Exposed Support Structures Wound Margin: Distinct, outline attached Exudate Amount: Medium Exudate Type: Serosanguineous Exudate Color: red, brown Foul Odor After Cleansing: No Slough/Fibrino Yes Wound Bed Granulation Amount: Small (1-33%) Exposed Structure Granulation Quality: Red, Pink Fascia Exposed: No Necrotic Amount: Large (67-100%) Fat Layer (Subcutaneous Tissue) Exposed: Yes Necrotic Quality: Eschar Tendon Exposed: No Muscle Exposed: No Joint Exposed: No Bone Exposed: No Treatment Notes Wound #4 (Ankle) Wound Laterality: Left, Lateral Cleanser Soap and Water Discharge Instruction: May shower and wash wound with dial antibacterial soap and water prior to dressing change. Wound Cleanser Discharge Instruction: Cleanse the wound with wound cleanser prior to applying a clean dressing using gauze sponges, not tissue or cotton balls. Peri-Wound Care Topical Primary Dressing Santyl Ointment Discharge Instruction: Apply nickel thick amount to wound bed  as instructed Secondary Dressing Woven Gauze Sponge, Non-Sterile 4x4 in Discharge Instruction: Apply over primary dressing as directed. Secured With [employer], 4.5x3.1 (in/yd) Discharge Instruction: Secure with Kerlix as directed. Paper Tape, 2x10 (in/yd) Discharge  Instruction: Secure dressing with tape as directed. Compression Wrap Compression Stockings Add-Ons Electronic Signature(s) Signed: 06/20/2021 5:23:14 PM By: Fonnie Mu RN Entered By: Fonnie Mu on 06/16/2021 11:07:50 -------------------------------------------------------------------------------- Wound Assessment Details Patient Name: Date of Service: Kelly [name] Kelly Cherly Anderson. 06/16/2021 10:30 A M Medical Record Number: [id number] Patient Account Number: [id number] Date of Birth/Sex: Treating RN: 1995/11/14 (25 y.o. Kelly [name], Kelly [name] Primary Care Fawzi Melman: Dione Booze Other Clinician: Referring Idy Rawling: Treating Ayani Ospina/Extender: Gennaro [place] in Treatment: 17 Wound Status Wound Number: 5 Primary Etiology: Cellulitis Wound Location: Left, Anterior Ankle Secondary Etiology: Lupus Wounding Event: Gradually Appeared Wound Status: Open Date Acquired: 04/14/2021 Comorbid History: Asthma, Lupus Erythematosus Weeks Of Treatment: 8 Clustered Wound: No Photos Wound Measurements Length: (cm) 2 Width: (cm) 1.5 Depth: (cm) 0.5 Area: (cm) 2.356 Volume: (cm) 1.178 % Reduction in Area: -150.1% % Reduction in Volume: -526.6% Epithelialization: None Tunneling: No Undermining: Yes Starting Position (o'clock): 12 Ending Position (o'clock): 1 Maximum Distance: (cm) 0.5 Wound Description Classification: Full Thickness Without Exposed Support Structures Wound Margin: Distinct, outline attached Exudate Amount: Medium Exudate Type: Serosanguineous Exudate Color: red, brown Foul Odor After Cleansing: No Slough/Fibrino Yes Wound Bed Granulation Amount: None Present (0%) Exposed Structure Necrotic Amount: Large (67-100%) Fascia Exposed: No Necrotic Quality: Eschar Fat Layer (Subcutaneous Tissue) Exposed: Yes Tendon Exposed: No Muscle Exposed: No Joint Exposed: No Bone Exposed: No Treatment Notes Wound #5 (Ankle) Wound Laterality:  Left, Anterior Cleanser Soap and Water Discharge Instruction: May shower and wash wound with dial antibacterial soap and water prior to dressing change. Wound Cleanser Discharge Instruction: Cleanse the wound with wound cleanser prior to applying a clean dressing using gauze sponges, not tissue or cotton balls. Peri-Wound Care Topical Primary Dressing Santyl Ointment Discharge Instruction: Apply nickel thick amount to wound bed as instructed Secondary Dressing Woven Gauze Sponge, Non-Sterile 4x4 in Discharge Instruction: Apply over primary dressing as directed. Secured With [employer], 4.5x3.1 (in/yd) Discharge Instruction: Secure with Kerlix as directed. Paper Tape, 2x10 (in/yd) Discharge Instruction: Secure dressing with tape as directed. Compression Wrap Compression Stockings Add-Ons Electronic Signature(s) Signed: 06/20/2021 5:23:14 PM By: Fonnie Mu RN Entered By: Fonnie Mu on 06/16/2021 11:14:21 -------------------------------------------------------------------------------- Wound Assessment Details Patient Name: Date of Service: Kelly [name] Kelly Cherly Anderson. 06/16/2021 10:30 A M Medical Record Number: [id number] Patient Account Number: [id number] Date of Birth/Sex: Treating RN: June 21, 1996 (25 y.o. Kelly [name], Kelly [name] Primary Care Astou Lada: Dione Booze Other Clinician: Referring Talynn Lebon: Treating Jatinder Mcdonagh/Extender: Gennaro [place] in Treatment: 17 Wound Status Wound Number: 6 Primary Etiology: Lupus Wound Location: Left, Distal, Posterior Lower Leg Wound Status: Open Wounding Event: Gradually Appeared Comorbid History: Asthma, Lupus Erythematosus Date Acquired: 04/14/2021 Weeks Of Treatment: 8 Clustered Wound: No Photos Wound Measurements Length: (cm) 0.9 Width: (cm) 0.7 Depth: (cm) 0.4 Area: (cm) 0.495 Volume: (cm) 0.198 % Reduction in Area: 21.2% % Reduction in Volume: -57.1% Epithelialization: Medium  (34-66%) Tunneling: No Undermining: Yes Starting Position (o'clock): 9 Ending Position (o'clock): 12 Maximum Distance: (cm) 0.4 Wound Description Classification: Full Thickness Without Exposed Support Structures Wound Margin: Distinct, outline attached Exudate Amount: Medium Exudate Type: Serosanguineous Exudate Color: red, brown Foul Odor After Cleansing: No Slough/Fibrino Yes Wound Bed Granulation Amount: Small (1-33%) Exposed Structure Granulation Quality: Red Fascia Exposed: No Necrotic Amount:  Large (67-100%) Fat Layer (Subcutaneous Tissue) Exposed: No Necrotic Quality: Eschar Tendon Exposed: No Muscle Exposed: No Joint Exposed: No Bone Exposed: No Treatment Notes Wound #6 (Lower Leg) Wound Laterality: Left, Posterior, Distal Cleanser Soap and Water Discharge Instruction: May shower and wash wound with dial antibacterial soap and water prior to dressing change. Wound Cleanser Discharge Instruction: Cleanse the wound with wound cleanser prior to applying a clean dressing using gauze sponges, not tissue or cotton balls. Peri-Wound Care Topical Primary Dressing Santyl Ointment Discharge Instruction: Apply nickel thick amount to wound bed as instructed Secondary Dressing Woven Gauze Sponge, Non-Sterile 4x4 in Discharge Instruction: Apply over primary dressing as directed. Secured With [employer], 4.5x3.1 (in/yd) Discharge Instruction: Secure with Kerlix as directed. Paper Tape, 2x10 (in/yd) Discharge Instruction: Secure dressing with tape as directed. Compression Wrap Compression Stockings Add-Ons Electronic Signature(s) Signed: 06/20/2021 5:23:14 PM By: Fonnie Mu RN Entered By: Fonnie Mu on 06/16/2021 11:13:41 -------------------------------------------------------------------------------- Wound Assessment Details Patient Name: Date of Service: Kelly [name] Kelly Cherly Anderson. 06/16/2021 10:30 A M Medical Record Number: [id number] Patient  Account Number: [id number] Date of Birth/Sex: Treating RN: [birth year] (25 y.o. Kelly [name], Kelly [name] Primary Care Carmin Alvidrez: Dione Booze Other Clinician: Referring Meeah Totino: Treating Marlow Berenguer/Extender: Gennaro [place] in Treatment: 17 Wound Status Wound Number: 7 Primary Etiology: Lupus Wound Location: Left, Anterior Lower Leg Wound Status: Open Wounding Event: Other Lesion Comorbid History: Asthma, Lupus Erythematosus Date Acquired: 05/18/2021 Weeks Of Treatment: 4 Clustered Wound: No Photos Wound Measurements Length: (cm) 1.8 Width: (cm) 1.3 Depth: (cm) 0.5 Area: (cm) 1.838 Volume: (cm) 0.919 % Reduction in Area: -50% % Reduction in Volume: -149.7% Epithelialization: None Tunneling: No Undermining: Yes Starting Position (o'clock): 12 Ending Position (o'clock): 1 Maximum Distance: (cm) 0.3 Wound Description Classification: Full Thickness Without Exposed Support Structures Wound Margin: Well defined, not attached Exudate Amount: Medium Exudate Type: Purulent Exudate Color: yellow, brown, green Foul Odor After Cleansing: No Slough/Fibrino Yes Wound Bed Granulation Amount: Small (1-33%) Exposed Structure Granulation Quality: Red Fascia Exposed: No Necrotic Amount: Large (67-100%) Fat Layer (Subcutaneous Tissue) Exposed: Yes Tendon Exposed: No Muscle Exposed: No Joint Exposed: No Bone Exposed: No Treatment Notes Wound #7 (Lower Leg) Wound Laterality: Left, Anterior Cleanser Soap and Water Discharge Instruction: May shower and wash wound with dial antibacterial soap and water prior to dressing change. Wound Cleanser Discharge Instruction: Cleanse the wound with wound cleanser prior to applying a clean dressing using gauze sponges, not tissue or cotton balls. Peri-Wound Care Topical Primary Dressing Santyl Ointment Discharge Instruction: Apply nickel thick amount to wound bed as instructed Secondary Dressing Woven Gauze Sponge,  Non-Sterile 4x4 in Discharge Instruction: Apply over primary dressing as directed. Secured With [employer], 4.5x3.1 (in/yd) Discharge Instruction: Secure with Kerlix as directed. Paper Tape, 2x10 (in/yd) Discharge Instruction: Secure dressing with tape as directed. Compression Wrap Compression Stockings Add-Ons Electronic Signature(s) Signed: 06/20/2021 5:23:14 PM By: Fonnie Mu RN Entered By: Fonnie Mu on 06/16/2021 11:16:46 -------------------------------------------------------------------------------- Vitals Details Patient Name: Date of Service: Kelly [name] Kelly H M. 06/16/2021 10:30 A M Medical Record Number: [id number] Patient Account Number: [id number] Date of Birth/Sex: Treating RN: [birth year] (25 y.o. Kelly [name], Kelly [name] Primary Care Katlyne Nishida: Dione Booze Other Clinician: Referring Desiray Orchard: Treating Ravon Mortellaro/Extender: Gennaro [place] in Treatment: 17 Vital Signs Time Taken: 10:45 Temperature (F): 98.7 Height (in): 62 Pulse (bpm): 74 Respiratory Rate (breaths/min): 17 Blood Pressure (mmHg): 127/70 Reference Range: 80 - 120 mg / dl Electronic Signature(s) Signed: 06/20/2021 2:37:05 PM By: Nelly Rout,  Destiny Entered By: Karl Ito on 06/16/2021 10:42:23

## 2021-06-20 NOTE — Progress Notes (Signed)
Kelly [name] ([id number]) Visit Report for 06/16/2021 Chief Complaint Document Details Patient Name: Date of Service: Kelly [name]. 06/16/2021 10:30 A M Medical Record Number: [id number] Patient Account Number: [id number] Date of Birth/Sex: Treating RN: 1996-02-09 (25 y.o. Kelly [name] Primary Care Provider: Dione Booze Other Clinician: Referring Provider: Treating Provider/Extender: Gennaro Africa in Treatment: 17 Information Obtained from: Patient Chief Complaint Bilateral lower extremity wounds 04/21/21: left lower extremity wound s/p cellulitis Electronic Signature(s) Signed: 06/16/2021 12:53:04 PM By: Geralyn Corwin DO Entered By: Geralyn Corwin on 06/16/2021 12:45:43 -------------------------------------------------------------------------------- Debridement Details Patient Name: Date of Service: Kelly [name] [name] 06/16/2021 10:30 A M Medical Record Number: [id number] Patient Account Number: [id number] Date of Birth/Sex: Treating RN: 07-01-1996 (25 y.o. [name], [name] Primary Care Provider: Dione Booze Other Clinician: Referring Provider: Treating Provider/Extender: Gennaro Africa in Treatment: 17 Debridement Performed for Assessment: Wound #4 Left,Lateral Ankle Performed By: Physician Geralyn Corwin, DO Debridement Type: Debridement Level of Consciousness (Pre-procedure): Awake and Alert Pre-procedure Verification/Time Out Yes - 11:34 Taken: Start Time: 11:34 Pain Control: Lidocaine T Area Debrided (L x W): otal 2.5 (cm) x 2.5 (cm) = 6.25 (cm) Tissue and other material debrided: Viable, Non-Viable, Slough, Subcutaneous, Skin: Dermis , Skin: Epidermis, Slough Level: Skin/Subcutaneous Tissue Debridement Description: Excisional Instrument: Curette Bleeding: Minimum Hemostasis Achieved: Pressure End Time: 11:34 Procedural Pain: 0 Post Procedural Pain: 0 Response to  Treatment: Procedure was tolerated well Level of Consciousness (Post- Awake and Alert procedure): Post Debridement Measurements of Total Wound Length: (cm) 4.5 Width: (cm) 5 Depth: (cm) 0.3 Volume: (cm) 5.301 Character of Wound/Ulcer Post Debridement: Improved Post Procedure Diagnosis Same as Pre-procedure Electronic Signature(s) Signed: 06/16/2021 12:53:04 PM By: Geralyn Corwin DO Signed: 06/19/2021 12:51:50 PM By: Zenaida Deed RN, BSN Signed: 06/20/2021 2:37:05 PM By: Karl Ito Entered By: Karl Ito on 06/16/2021 11:35:42 -------------------------------------------------------------------------------- Debridement Details Patient Name: Date of Service: Kelly [name] [name] 06/16/2021 10:30 A M Medical Record Number: [id number] Patient Account Number: [id number] Date of Birth/Sex: Treating RN: 1995-11-08 (25 y.o. [name], [name] Primary Care Provider: Dione Booze Other Clinician: Referring Provider: Treating Provider/Extender: Gennaro Africa in Treatment: 17 Debridement Performed for Assessment: Wound #7 Left,Anterior Lower Leg Performed By: Physician Geralyn Corwin, DO Debridement Type: Debridement Level of Consciousness (Pre-procedure): Awake and Alert Pre-procedure Verification/Time Out Yes - 11:34 Taken: Start Time: 11:34 Pain Control: Lidocaine T Area Debrided (L x W): otal 1.8 (cm) x 1.3 (cm) = 2.34 (cm) Tissue and other material debrided: Viable, Non-Viable, Slough, Subcutaneous, Skin: Dermis , Skin: Epidermis, Slough Level: Skin/Subcutaneous Tissue Debridement Description: Excisional Instrument: Curette Bleeding: Minimum Hemostasis Achieved: Pressure End Time: 11:34 Procedural Pain: 0 Post Procedural Pain: 0 Response to Treatment: Procedure was tolerated well Level of Consciousness (Post- Awake and Alert procedure): Post Debridement Measurements of Total Wound Length: (cm) 1.8 Width: (cm)  1.3 Depth: (cm) 0.5 Volume: (cm) 0.919 Character of Wound/Ulcer Post Debridement: Improved Post Procedure Diagnosis Same as Pre-procedure Electronic Signature(s) Signed: 06/16/2021 12:53:04 PM By: Geralyn Corwin DO Signed: 06/19/2021 12:51:50 PM By: Zenaida Deed RN, BSN Signed: 06/20/2021 2:37:05 PM By: Karl Ito Entered By: Karl Ito on 06/16/2021 11:36:51 -------------------------------------------------------------------------------- HPI Details Patient Name: Date of Service: Kelly [name] [name] 06/16/2021 10:30 A M Medical Record Number: [id number] Patient Account Number: [id number] Date of Birth/Sex: Treating RN: Jan 02, 1996 (25 y.o. Kelly [name] Primary Care Provider: Dione Booze Other Clinician: Referring Provider: Treating Provider/Extender: Gennaro Africa in  Treatment: 17 History of Present Illness HPI Description: Admission 6/21 [name] is A 25 year old female with a past medical history of systemic lupus erythematosus that presents with bilateral lower extremity wounds that started in April 2022. She has had skin issues in the past where she developed very small wounds but these healed with time. She has never had open wounds like she does on her legs. She currently reports minimal drainage to the wounds. She does have pain to these areas but has been overall stable. She denies signs of infection. She has visited the ED for this issue and was recently prescribed doxycycline for possible cellulitis. She follows with [facility] for her SLE and is currently on Plaquenil, prednisone and CellCept. 6/27; patient presents for 1 week follow-up. She states she saw her rheumatologist on 6/22 and prednisone was increased from 10 mg to 20 mg daily. She is currently at the highest dose of Plaquenil and CellCept and is going to try an infusion later today at her regimen. She reports improvement to the wounds.  She has been using collagen with dressing changes. She currently denies signs of infection. Readmission 8/26 Patient presents to clinic today for open wounds to her left lower extremity. Her previous wounds that were treated have closed. She states that on 8/9 she developed cellulitis and was treated with 2 rounds of antibiotics. Her cellulitis has resolved. She had blisters that opened and are now large wounds. She has seen her dermatologist and rheumatologist since she has systemic lupus and a hard time healing. No changes have been made to her medications. She was recommended antibiotic ointment by her dermatologist to her wounds She picked up the antibiotic ointment today.. She reports pain to these areas. She denies purulent drainage, increased warmth or erythema to the left lower extremity. She currently keeps the areas covered. Of note she had an Achilles tendon rupture on her right leg and is currently in a soft cast. 9/13; patient presents for follow-up. She missed her last clinic appointment. She has been using Santyl to the wound beds daily. She has no issues or complaints today. She reports the Santyl is helping clean up the wounds. She currently denies signs of infection. 9/22; patient states that 1 week ago she was seen by dermatology and they placed an Unna boot with Bactroban on her left lower extremity. Today she presents with a Unna boot in place. She currently denies signs of infection. 10/7; patient presents for follow-up. She reports improvement. She has been using Santyl to the areas of nonviable tissue and collagen to granulation tissue. She has been approved for infusions for her lupus. She is going to start these soon. These will be injection she can do at home. She currently denies signs of infection. 10/21; patient presents for follow-up. She reports continued improvement to her wound healing. She has been using Santyl to all areas except for the superior posterior left  leg wound and she is using collagen to this. She has started her injection therapy for lupus. She started 1 week ago. She currently denies signs of infection. Electronic Signature(s) Signed: 06/16/2021 12:53:04 PM By: Geralyn Corwin DO Entered By: Geralyn Corwin on 06/16/2021 12:49:01 -------------------------------------------------------------------------------- Physical Exam Details Patient Name: Date of Service: [name] 06/16/2021 10:30 A M Medical Record Number: [id number] Patient Account Number: [id number] Date of Birth/Sex: Treating RN: [birth year] (25 y.o. Kelly [name] Primary Care Provider: Dione Booze Other Clinician: Referring Provider: Treating Provider/Extender: Ledell Peoples  Weeks in Treatment: 17 Constitutional respirations regular, non-labored and within target range for patient.. Cardiovascular 2+ dorsalis pedis/posterior tibialis pulses. Psychiatric pleasant and cooperative. Notes Left lower extremity: 5 wounds present throughout. 3 out of the 5 have slough throughout. The posterior wounds have granulation tissue. No obvious signs of infection. Overall appearance is improved since last clinic visit. Electronic Signature(s) Signed: 06/16/2021 12:53:04 PM By: Geralyn Corwin DO Entered By: Geralyn Corwin on 06/16/2021 12:49:57 -------------------------------------------------------------------------------- Physician Orders Details Patient Name: Date of Service: Kelly [name] [name] 06/16/2021 10:30 A M Medical Record Number: [id number] Patient Account Number: [id number] Date of Birth/Sex: Treating RN: 1996-01-14 (25 y.o. Debara Pickett, Yvonne Kendall Primary Care Provider: Dione Booze Other Clinician: Referring Provider: Treating Provider/Extender: Gennaro Africa in Treatment: [phone] Verbal / Phone Orders: No Diagnosis Coding ICD-10 Coding Code Description S81.802D Unspecified open wound,  left lower leg, subsequent encounter S91.002D Unspecified open wound, left ankle, subsequent encounter M32.9 Systemic lupus erythematosus, unspecified Follow-up Appointments ppointment in 2 weeks. - Dr. Mikey Bussing Return A Bathing/ Shower/ Hygiene May shower and wash wound with soap and water. Edema Control - Lymphedema / SCD / Other Elevate legs to the level of the heart or above for 30 minutes daily and/or when sitting, a frequency of: - 3-4 times a day throughout the day. Avoid standing for long periods of time. Additional Orders / Instructions Follow Nutritious Diet Wound Treatment Wound #3 - Lower Leg Wound Laterality: Left, Posterior Cleanser: Soap and Water 1 x Per Day/30 Days Discharge Instructions: May shower and wash wound with dial antibacterial soap and water prior to dressing change. Prim Dressing: Promogran Prisma Matrix, 4.34 (sq in) (silver collagen) 1 x Per Day/30 Days ary Discharge Instructions: Moisten collagen with saline or hydrogel Secondary Dressing: Woven Gauze Sponge, Non-Sterile 4x4 in (Generic) 1 x Per Day/30 Days Discharge Instructions: Apply over primary dressing as directed. Secured With: [employer], 4.5x3.1 (in/yd) (Generic) 1 x Per Day/30 Days Discharge Instructions: Secure with Kerlix as directed. Secured With: Paper Tape, 2x10 (in/yd) (Generic) 1 x Per Day/30 Days Discharge Instructions: Secure dressing with tape as directed. Wound #4 - Ankle Wound Laterality: Left, Lateral Cleanser: Soap and Water 1 x Per Day/30 Days Discharge Instructions: May shower and wash wound with dial antibacterial soap and water prior to dressing change. Prim Dressing: Santyl Ointment 1 x Per Day/30 Days ary Discharge Instructions: Apply nickel thick amount to wound bed as instructed Secondary Dressing: Woven Gauze Sponge, Non-Sterile 4x4 in (Generic) 1 x Per Day/30 Days Discharge Instructions: Apply over primary dressing as directed. Secured With: [employer], 4.5x3.1 (in/yd) (Generic) 1 x Per Day/30 Days Discharge Instructions: Secure with Kerlix as directed. Secured With: Paper Tape, 2x10 (in/yd) (Generic) 1 x Per Day/30 Days Discharge Instructions: Secure dressing with tape as directed. Wound #5 - Ankle Wound Laterality: Left, Anterior Cleanser: Soap and Water 1 x Per Day/30 Days Discharge Instructions: May shower and wash wound with dial antibacterial soap and water prior to dressing change. Prim Dressing: Santyl Ointment 1 x Per Day/30 Days ary Discharge Instructions: Apply nickel thick amount to wound bed as instructed Secondary Dressing: Woven Gauze Sponge, Non-Sterile 4x4 in (Generic) 1 x Per Day/30 Days Discharge Instructions: Apply over primary dressing as directed. Secured With: [employer], 4.5x3.1 (in/yd) (Generic) 1 x Per Day/30 Days Discharge Instructions: Secure with Kerlix as directed. Secured With: Paper Tape, 2x10 (in/yd) (Generic) 1 x Per Day/30 Days Discharge Instructions: Secure dressing with tape as directed. Wound #6 -  Lower Leg Wound Laterality: Left, Posterior, Distal Cleanser: Soap and Water 1 x Per Day/30 Days Discharge Instructions: May shower and wash wound with dial antibacterial soap and water prior to dressing change. Prim Dressing: Santyl Ointment 1 x Per Day/30 Days ary Discharge Instructions: Apply nickel thick amount to wound bed as instructed Secondary Dressing: Woven Gauze Sponge, Non-Sterile 4x4 in (Generic) 1 x Per Day/30 Days Discharge Instructions: Apply over primary dressing as directed. Secured With: [employer], 4.5x3.1 (in/yd) (Generic) 1 x Per Day/30 Days Discharge Instructions: Secure with Kerlix as directed. Secured With: Paper Tape, 2x10 (in/yd) (Generic) 1 x Per Day/30 Days Discharge Instructions: Secure dressing with tape as directed. Wound #7 - Lower Leg Wound Laterality: Left, Anterior Cleanser: Soap and Water 1 x Per Day/30 Days Discharge Instructions: May shower  and wash wound with dial antibacterial soap and water prior to dressing change. Prim Dressing: Santyl Ointment 1 x Per Day/30 Days ary Discharge Instructions: Apply nickel thick amount to wound bed as instructed Secondary Dressing: Woven Gauze Sponge, Non-Sterile 4x4 in (Generic) 1 x Per Day/30 Days Discharge Instructions: Apply over primary dressing as directed. Secured With: [employer], 4.5x3.1 (in/yd) (Generic) 1 x Per Day/30 Days Discharge Instructions: Secure with Kerlix as directed. Secured With: Paper Tape, 2x10 (in/yd) (Generic) 1 x Per Day/30 Days Discharge Instructions: Secure dressing with tape as directed. Electronic Signature(s) Signed: 06/16/2021 1:21:44 PM By: Shawn Stall RN, BSN Signed: 06/19/2021 4:28:21 PM By: Geralyn Corwin DO Entered By: Shawn Stall on 06/16/2021 13:04:55 -------------------------------------------------------------------------------- Problem List Details Patient Name: Date of Service: Kelly [name] [name] 06/16/2021 10:30 A M Medical Record Number: [id number] Patient Account Number: [id number] Date of Birth/Sex: Treating RN: [birth year] (25 y.o. Kelly [name] Primary Care Provider: Dione Booze Other Clinician: Referring Provider: Treating Provider/Extender: Gennaro Africa in Treatment: 17 Active Problems ICD-10 Encounter Code Description Active Date MDM Diagnosis S81.802D Unspecified open wound, left lower leg, subsequent encounter 05/09/2021 No Yes S91.002D Unspecified open wound, left ankle, subsequent encounter 05/09/2021 No Yes M32.9 Systemic lupus erythematosus, unspecified 02/14/2021 No Yes Inactive Problems ICD-10 Code Description Active Date Inactive Date L97.819 Non-pressure chronic ulcer of other part of right lower leg with unspecified severity 02/13/2021 02/13/2021 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 with unspecified severity 02/13/2021  02/13/2021 N82.956O Unspecified open wound, left lower leg, initial encounter 04/21/2021 04/21/2021 S91.002A Unspecified open wound, left ankle, initial encounter 04/21/2021 04/21/2021 Resolved Problems Electronic Signature(s) Signed: 06/16/2021 12:53:04 PM By: Geralyn Corwin DO Entered By: Geralyn Corwin on 06/16/2021 12:45:23 -------------------------------------------------------------------------------- Progress Note Details Patient Name: Date of Service: Kelly [name] [name] 06/16/2021 10:30 A M Medical Record Number: [id number] Patient Account Number: [id number] Date of Birth/Sex: Treating RN: 06-14-1996 (25 y.o. Kelly [name] Primary Care Provider: Dione Booze Other Clinician: Referring Provider: Treating Provider/Extender: Gennaro Africa in Treatment: 17 Subjective Chief Complaint Information obtained from Patient Bilateral lower extremity wounds 04/21/21: left lower extremity wound s/p cellulitis History of Present Illness (HPI) Admission 6/21 Ms. Fable Huisman is A 25 year old female with a past medical history of systemic lupus erythematosus that presents with bilateral lower extremity wounds that started in April 2022. She has had skin issues in the past where she developed very small wounds but these healed with time. She has never had open wounds like she does on her legs. She currently reports minimal drainage to the wounds. She does have pain to these areas but has been overall stable.  She denies signs of infection. She has visited the ED for this issue and was recently prescribed doxycycline for possible cellulitis. She follows with [facility] for her SLE and is currently on Plaquenil, prednisone and CellCept. 6/27; patient presents for 1 week follow-up. She states she saw her rheumatologist on 6/22 and prednisone was increased from 10 mg to 20 mg daily. She is currently at the highest dose of Plaquenil and CellCept and is  going to try an infusion later today at her regimen. She reports improvement to the wounds. She has been using collagen with dressing changes. She currently denies signs of infection. Readmission 8/26 Patient presents to clinic today for open wounds to her left lower extremity. Her previous wounds that were treated have closed. She states that on 8/9 she developed cellulitis and was treated with 2 rounds of antibiotics. Her cellulitis has resolved. She had blisters that opened and are now large wounds. She has seen her dermatologist and rheumatologist since she has systemic lupus and a hard time healing. No changes have been made to her medications. She was recommended antibiotic ointment by her dermatologist to her wounds She picked up the antibiotic ointment today.. She reports pain to these areas. She denies purulent drainage, increased warmth or erythema to the left lower extremity. She currently keeps the areas covered. Of note she had an Achilles tendon rupture on her right leg and is currently in a soft cast. 9/13; patient presents for follow-up. She missed her last clinic appointment. She has been using Santyl to the wound beds daily. She has no issues or complaints today. She reports the Santyl is helping clean up the wounds. She currently denies signs of infection. 9/22; patient states that 1 week ago she was seen by dermatology and they placed an Unna boot with Bactroban on her left lower extremity. Today she presents with a Unna boot in place. She currently denies signs of infection. 10/7; patient presents for follow-up. She reports improvement. She has been using Santyl to the areas of nonviable tissue and collagen to granulation tissue. She has been approved for infusions for her lupus. She is going to start these soon. These will be injection she can do at home. She currently denies signs of infection. 10/21; patient presents for follow-up. She reports continued improvement to her  wound healing. She has been using Santyl to all areas except for the superior posterior left leg wound and she is using collagen to this. She has started her injection therapy for lupus. She started 1 week ago. She currently denies signs of infection. Patient History Information obtained from Patient. Family History Diabetes - Paternal Grandparents, Hypertension - Paternal Grandparents, Thyroid Problems - Maternal Grandparents,Paternal Grandparents, No family history of Cancer, Heart Disease, Hereditary Spherocytosis, Kidney Disease, Lung Disease, Seizures, Stroke, Tuberculosis. Social History Never smoker, Marital Status - Married, Alcohol Use - Moderate, Drug Use - Current History - Marijuana, Caffeine Use - Daily - Coffee. Medical History Respiratory Patient has history of Asthma Immunological Patient has history of Lupus Erythematosus Objective Constitutional respirations regular, non-labored and within target range for patient.. Vitals Time Taken: 10:45 AM, Height: 62 in, Temperature: 98.7 F, Pulse: 74 bpm, Respiratory Rate: 17 breaths/min, Blood Pressure: 127/70 mmHg. Cardiovascular 2+ dorsalis pedis/posterior tibialis pulses. Psychiatric pleasant and cooperative. General Notes: Left lower extremity: 5 wounds present throughout. 3 out of the 5 have slough throughout. The posterior wounds have granulation tissue. No obvious signs of infection. Overall appearance is improved since last clinic visit. Integumentary (  Hair, Skin) Wound #3 status is Open. Original cause of wound was Gradually Appeared. The date acquired was: 04/14/2021. The wound has been in treatment 8 weeks. The wound is located on the Left,Posterior Lower Leg. The wound measures 1cm length x 1cm width x 0.6cm depth; 0.785cm^2 area and 0.471cm^3 volume. There is Fat Layer (Subcutaneous Tissue) exposed. There is no tunneling noted, however, there is undermining starting at 6:00 and ending at 8:00 with a maximum distance  of 0.6cm. There is a medium amount of serosanguineous drainage noted. The wound margin is distinct with the outline attached to the wound base. There is large (67-100%) red, pink granulation within the wound bed. There is a small (1-33%) amount of necrotic tissue within the wound bed. Wound #4 status is Open. Original cause of wound was Gradually Appeared. The date acquired was: 04/14/2021. The wound has been in treatment 8 weeks. The wound is located on the Left,Lateral Ankle. The wound measures 4.5cm length x 5cm width x 0.3cm depth; 17.671cm^2 area and 5.301cm^3 volume. There is Fat Layer (Subcutaneous Tissue) exposed. There is no tunneling or undermining noted. There is a medium amount of serosanguineous drainage noted. The wound margin is distinct with the outline attached to the wound base. There is small (1-33%) red, pink granulation within the wound bed. There is a large (67- 100%) amount of necrotic tissue within the wound bed including Eschar. Wound #5 status is Open. Original cause of wound was Gradually Appeared. The date acquired was: 04/14/2021. The wound has been in treatment 8 weeks. The wound is located on the Left,Anterior Ankle. The wound measures 2cm length x 1.5cm width x 0.5cm depth; 2.356cm^2 area and 1.178cm^3 volume. There is Fat Layer (Subcutaneous Tissue) exposed. There is no tunneling noted, however, there is undermining starting at 12:00 and ending at 1:00 with a maximum distance of 0.5cm. There is a medium amount of serosanguineous drainage noted. The wound margin is distinct with the outline attached to the wound base. There is no granulation within the wound bed. There is a large (67-100%) amount of necrotic tissue within the wound bed including Eschar. Wound #6 status is Open. Original cause of wound was Gradually Appeared. The date acquired was: 04/14/2021. The wound has been in treatment 8 weeks. The wound is located on the Left,Distal,Posterior Lower Leg. The wound  measures 0.9cm length x 0.7cm width x 0.4cm depth; 0.495cm^2 area and 0.198cm^3 volume. There is no tunneling noted, however, there is undermining starting at 9:00 and ending at 12:00 with a maximum distance of 0.4cm. There is a medium amount of serosanguineous drainage noted. The wound margin is distinct with the outline attached to the wound base. There is small (1-33%) red granulation within the wound bed. There is a large (67-100%) amount of necrotic tissue within the wound bed including Eschar. Wound #7 status is Open. Original cause of wound was Other Lesion. The date acquired was: 05/18/2021. The wound has been in treatment 4 weeks. The wound is located on the Left,Anterior Lower Leg. The wound measures 1.8cm length x 1.3cm width x 0.5cm depth; 1.838cm^2 area and 0.919cm^3 volume. There is Fat Layer (Subcutaneous Tissue) exposed. There is no tunneling noted, however, there is undermining starting at 12:00 and ending at 1:00 with a maximum distance of 0.3cm. There is a medium amount of purulent drainage noted. The wound margin is well defined and not attached to the wound base. There is small (1-33%) red granulation within the wound bed. There is a large (67-100%) amount  of necrotic tissue within the wound bed. Assessment Active Problems ICD-10 Unspecified open wound, left lower leg, subsequent encounter Unspecified open wound, left ankle, subsequent encounter Systemic lupus erythematosus, unspecified Patient's wounds are stable with no signs of infection. I debrided nonviable tissue. I recommended continuing with Santyl to all wound beds except for the posterior wound and she can continue with collagen to this. I am hopeful that her new medication for lupus will help with her wound healing. These are atypical wounds that are her related to this disease. Follow-up in 2 weeks. Procedures Wound #4 Pre-procedure diagnosis of Wound #4 is a Cellulitis located on the Left,Lateral Ankle . There was  a Excisional Skin/Subcutaneous Tissue Debridement with a total area of 6.25 sq cm performed by Geralyn Corwin, DO. With the following instrument(s): Curette to remove Viable and Non-Viable tissue/material. Material removed includes Subcutaneous Tissue, Slough, Skin: Dermis, and Skin: Epidermis after achieving pain control using Lidocaine. No specimens were taken. A time out was conducted at 11:34, prior to the start of the procedure. A Minimum amount of bleeding was controlled with Pressure. The procedure was tolerated well with a pain level of 0 throughout and a pain level of 0 following the procedure. Post Debridement Measurements: 4.5cm length x 5cm width x 0.3cm depth; 5.301cm^3 volume. Character of Wound/Ulcer Post Debridement is improved. Post procedure Diagnosis Wound #4: Same as Pre-Procedure Wound #7 Pre-procedure diagnosis of Wound #7 is a Lupus located on the Left,Anterior Lower Leg . There was a Excisional Skin/Subcutaneous Tissue Debridement with a total area of 2.34 sq cm performed by Geralyn Corwin, DO. With the following instrument(s): Curette to remove Viable and Non-Viable tissue/material. Material removed includes Subcutaneous Tissue, Slough, Skin: Dermis, and Skin: Epidermis after achieving pain control using Lidocaine. No specimens were taken. A time out was conducted at 11:34, prior to the start of the procedure. A Minimum amount of bleeding was controlled with Pressure. The procedure was tolerated well with a pain level of 0 throughout and a pain level of 0 following the procedure. Post Debridement Measurements: 1.8cm length x 1.3cm width x 0.5cm depth; 0.919cm^3 volume. Character of Wound/Ulcer Post Debridement is improved. Post procedure Diagnosis Wound #7: Same as Pre-Procedure Plan Follow-up Appointments: Return Appointment in 2 weeks. - Dr. Mikey Bussing Bathing/ Shower/ Hygiene: May shower and wash wound with soap and water. Edema Control - Lymphedema / SCD /  Other: Elevate legs to the level of the heart or above for 30 minutes daily and/or when sitting, a frequency of: - 3-4 times a day throughout the day. Avoid standing for long periods of time. Additional Orders / Instructions: Follow Nutritious Diet 1. In office sharp debridement 2. Santyl 3. Collagen 4. Follow-up in 2 weeks Electronic Signature(s) Signed: 06/16/2021 12:53:04 PM By: Geralyn Corwin DO Entered By: Geralyn Corwin on 06/16/2021 12:52:13 -------------------------------------------------------------------------------- HxROS Details Patient Name: Date of Service: Kelly [name] [name] 06/16/2021 10:30 A M Medical Record Number: [id number] Patient Account Number: [id number] Date of Birth/Sex: Treating RN: 07/04/1996 (25 y.o. Kelly [name] Primary Care Provider: Dione Booze Other Clinician: Referring Provider: Treating Provider/Extender: Gennaro Africa in Treatment: 17 Information Obtained From Patient Respiratory Medical History: Positive for: Asthma Immunological Medical History: Positive for: Lupus Erythematosus Immunizations Pneumococcal Vaccine: Received Pneumococcal Vaccination: No Implantable Devices None Family and Social History Cancer: No; Diabetes: Yes - Paternal Grandparents; Heart Disease: No; Hereditary Spherocytosis: No; Hypertension: Yes - Paternal Grandparents; Kidney Disease: No; Lung Disease: No; Seizures: No; Stroke: No; Thyroid Problems: Yes -  Maternal Grandparents,Paternal Grandparents; Tuberculosis: No; Never smoker; Marital Status - Married; Alcohol Use: Moderate; Drug Use: Current History - Marijuana; Caffeine Use: Daily - Coffee; Financial Concerns: No; Food, Clothing or Shelter Needs: No; Support System Lacking: No Electronic Signature(s) Signed: 06/16/2021 12:53:04 PM By: Geralyn Corwin DO Signed: 06/19/2021 12:51:50 PM By: Zenaida Deed RN, BSN Entered By: Geralyn Corwin on 06/16/2021  12:49:06 -------------------------------------------------------------------------------- SuperBill Details Patient Name: Date of Service: Kelly [name] [name] 06/16/2021 Medical Record Number: [id number] Patient Account Number: [id number] Date of Birth/Sex: Treating RN: 08-10-1996 (25 y.o. Kelly [name] Primary Care Provider: Dione Booze Other Clinician: Referring Provider: Treating Provider/Extender: Gennaro Africa in Treatment: 17 Diagnosis Coding ICD-10 Codes Code Description [phone] Unspecified open wound, left lower leg, subsequent encounter S91.002D Unspecified open wound, left ankle, subsequent encounter M32.9 Systemic lupus erythematosus, unspecified Facility Procedures Physician Procedures : CPT4 Code Description Modifier [id number] - WC PHYS SUBQ TISS 20 SQ CM ICD-10 Diagnosis Description S81.802D Unspecified open wound, left lower leg, subsequent encounter M32.9 Systemic lupus erythematosus, unspecified Quantity: 1 Electronic Signature(s) Signed: 06/16/2021 12:53:04 PM By: Geralyn Corwin DO Entered By: Geralyn Corwin on 06/16/2021 12:52:26

## 2021-06-22 DIAGNOSIS — S86011D Strain of right Achilles tendon, subsequent encounter: Secondary | ICD-10-CM | POA: Diagnosis not present

## 2021-06-29 ENCOUNTER — Encounter (HOSPITAL_BASED_OUTPATIENT_CLINIC_OR_DEPARTMENT_OTHER): Payer: BC Managed Care – PPO | Attending: Internal Medicine | Admitting: Internal Medicine

## 2021-06-29 ENCOUNTER — Other Ambulatory Visit: Payer: Self-pay

## 2021-06-29 DIAGNOSIS — L97812 Non-pressure chronic ulcer of other part of right lower leg with fat layer exposed: Secondary | ICD-10-CM | POA: Diagnosis not present

## 2021-06-29 DIAGNOSIS — S81802D Unspecified open wound, left lower leg, subsequent encounter: Secondary | ICD-10-CM | POA: Diagnosis not present

## 2021-06-29 DIAGNOSIS — M329 Systemic lupus erythematosus, unspecified: Secondary | ICD-10-CM | POA: Insufficient documentation

## 2021-06-29 DIAGNOSIS — S91002D Unspecified open wound, left ankle, subsequent encounter: Secondary | ICD-10-CM

## 2021-06-29 DIAGNOSIS — L97822 Non-pressure chronic ulcer of other part of left lower leg with fat layer exposed: Secondary | ICD-10-CM | POA: Diagnosis not present

## 2021-06-29 DIAGNOSIS — S86011D Strain of right Achilles tendon, subsequent encounter: Secondary | ICD-10-CM | POA: Diagnosis not present

## 2021-06-30 DIAGNOSIS — S86011D Strain of right Achilles tendon, subsequent encounter: Secondary | ICD-10-CM | POA: Diagnosis not present

## 2021-07-02 ENCOUNTER — Other Ambulatory Visit: Payer: Self-pay

## 2021-07-02 DIAGNOSIS — Z30013 Encounter for initial prescription of injectable contraceptive: Secondary | ICD-10-CM

## 2021-07-03 ENCOUNTER — Other Ambulatory Visit: Payer: Self-pay | Admitting: Obstetrics

## 2021-07-03 ENCOUNTER — Ambulatory Visit: Payer: Medicaid Other

## 2021-07-03 DIAGNOSIS — Z30013 Encounter for initial prescription of injectable contraceptive: Secondary | ICD-10-CM

## 2021-07-03 MED ORDER — MEDROXYPROGESTERONE ACETATE 150 MG/ML IM SUSP
150.0000 mg | INTRAMUSCULAR | 0 refills | Status: DC
Start: 2021-07-03 — End: 2021-10-02

## 2021-07-04 NOTE — Progress Notes (Signed)
Kelly [name], Kelly [name] ([id number]) Visit Report for 06/29/2021 Arrival Information Details Patient Name: Date of Service: Kelly [name], Kelly [name]. 06/29/2021 9:30 A M Medical Record Number: [id number] Patient Account Number: [id number] Date of Birth/Sex: Treating RN: [birth year] [name] (25 y.o. Kelly [name], Kelly [name] Primary Care Kelly [name]: Kelly [name] Other Clinician: Referring Kelly [name]: Treating Kelly [name]/Kelly [name] in Treatment: 19 Visit Information History Since Last Visit Added or deleted any medications: No Patient Arrived: Ambulatory Any new allergies or adverse reactions: No Arrival Time: 09:41 Had a fall or experienced change in No Accompanied By: husband activities of daily living that may affect Transfer Assistance: None risk of falls: Patient Identification Verified: Yes Signs or symptoms of abuse/neglect since last visito No Secondary Verification Process Completed: Yes Hospitalized since last visit: No Patient Requires Transmission-Based Precautions: No Implantable device outside of the clinic excluding No Patient Has Alerts: Yes cellular tissue based products placed in the center Patient Alerts: Left ABi=1.18 since last visit: Has Dressing in Place as Prescribed: Yes Pain Present Now: No Electronic Signature(s) Signed: 06/29/2021 10:21:29 AM By: Kelly [name] Entered By: Kelly [name] on 06/29/2021 09:42:46 -------------------------------------------------------------------------------- Lower Extremity Assessment Details Patient Name: Date of Service: Kelly [name] 06/29/2021 9:30 A M Medical Record Number: [id number] Patient Account Number: [id number] Date of Birth/Sex: Treating RN: Kelly [name] (25 y.o. Kelly [name] Primary Care Kelly [name]: Kelly [name] Other Clinician: Referring Kelly [name]: Treating Kelly [name]/Kelly [name] in Treatment: 19 Edema Assessment Assessed: Kelly [name]: Yes]  Kelly [name]: No] Edema: [Left: Ye] [Right: s] Calf Left: Right: Point of Measurement: 27 cm From Medial Instep 34 cm Ankle Left: Right: Point of Measurement: 8 cm From Medial Instep 22 cm Vascular Assessment Pulses: Dorsalis Pedis Palpable: [Left:Yes] Electronic Signature(s) Signed: 06/29/2021 6:31:34 PM By: Kelly [name] Entered By: Kelly [name] on 06/29/2021 10:03:16 -------------------------------------------------------------------------------- Multi Wound Chart Details Patient Name: Date of Service: Kelly [name] 06/29/2021 9:30 A M Medical Record Number: [id number] Patient Account Number: [id number] Date of Birth/Sex: Treating RN: [name] (25 y.o. Kelly [name], Kelly [name] Primary Care Kacyn Souder: Kelly [name] Other Clinician: Referring Kelly [name]: Treating Madlynn Lundeen/Kelly [name] in Treatment: 19 Vital Signs Height(in): 62 Pulse(bpm): 76 Weight(lbs): Blood Pressure(mmHg): 122/86 Body Mass Index(BMI): Temperature(F): 98.3 Respiratory Rate(breaths/min): 17 Photos: Left, Posterior Lower Leg Left, Lateral Ankle Left, Anterior Ankle Wound Location: Gradually Appeared Gradually Appeared Gradually Appeared Wounding Event: Cellulitis Cellulitis Cellulitis Primary Etiology: N/A Lupus Lupus Secondary Etiology: Asthma, Lupus Erythematosus Asthma, Lupus Erythematosus Asthma, Lupus Erythematosus Comorbid History: 04/14/2021 04/14/2021 04/14/2021 Date Acquired: Weeks of Treatment: Open Open Open Wound Status: 0.7x0.6x0.2 4.5x4.2x0.2 2x1.4x0.3 Measurements L x W x D (cm) 0.33 14.844 2.199 A (cm) : rea 0.066 2.969 0.66 Volume (cm) : 73.10% 5.50% -133.40% % Reduction in A rea: 95.50% 52.70% -251.10% % Reduction in Volume: Full Thickness With Exposed Support Full Thickness Without Exposed Full Thickness Without Exposed Classification: Structures Support Structures Support Structures Medium Medium Medium Exudate A  mount: Serosanguineous Serosanguineous Serosanguineous Exudate Type: red, brown red, brown red, brown Exudate Color: Distinct, outline attached Distinct, outline attached Distinct, outline attached Wound Margin: Large (67-100%) Large (67-100%) Medium (34-66%) Granulation A mount: Red, Pink Red, Pink N/A Granulation Quality: Small (1-33%) Small (1-33%) Medium (34-66%) Necrotic A mount: N/A Adherent Slough Adherent Slough Necrotic Tissue: Fat Layer (Subcutaneous Tissue): Yes Fat Layer (Subcutaneous Tissue): Yes Fat Layer (Subcutaneous Tissue): Yes Exposed Structures: Fascia: No Fascia: No Fascia: No Tendon: No Tendon: No Tendon: No Muscle: No Muscle: No Muscle:  No Joint: No Joint: No Joint: No Bone: No Bone: No Bone: No None None None Epithelialization: Debridement - Selective/Open Wound Debridement - Excisional Debridement - Excisional Debridement: Pre-procedure Verification/Time Out 10:17 10:17 10:17 Taken: Other Other Other Pain Control: Slough Subcutaneous, Slough Subcutaneous, Slough Tissue Debrided: Skin/Dermis Skin/Subcutaneous Tissue Skin/Subcutaneous Tissue Level: 0.42 18.9 2.8 Debridement A (sq cm): rea Curette Curette Curette Instrument: Minimum Minimum Minimum Bleeding: Pressure Pressure Pressure Hemostasis Achieved: Procedure was tolerated well Procedure was tolerated well Procedure was tolerated well Debridement Treatment Response: 0.7x0.6x0.2 4.5x4.2x0.2 2x1.4x0.3 Post Debridement Measurements L x W x D (cm) 0.066 2.969 0.66 Post Debridement Volume: (cm) Debridement Debridement Debridement Procedures Performed: Wound Number: 6 7 N/A Photos: N/A Left, Distal, Posterior Lower Leg Left, Anterior Lower Leg N/A Wound Location: Gradually Appeared Other Lesion N/A Wounding Event: Lupus Lupus N/A Primary Etiology: N/A N/A N/A Secondary Etiology: Asthma, Lupus Erythematosus Asthma, Lupus Erythematosus N/A Comorbid History: 04/14/2021  05/18/2021 N/A Date Acquired: 9 6 N/A Weeks of Treatment: Open Open N/A Wound Status: 0.5x0.3x0.2 1.6x1x0.3 N/A Measurements L x W x D (cm) 0.118 1.257 N/A A (cm) : rea 0.024 0.377 N/A Volume (cm) : 81.20% -2.60% N/A % Reduction in A rea: 81.00% -2.40% N/A % Reduction in Volume: Full Thickness Without Exposed Full Thickness Without Exposed N/A Classification: Support Structures Support Structures Medium Medium N/A Exudate A mount: Serosanguineous Purulent N/A Exudate Type: red, brown yellow, brown, green N/A Exudate Color: Distinct, outline attached Well defined, not attached N/A Wound Margin: Small (1-33%) Small (1-33%) N/A Granulation A mount: Red Red N/A Granulation Quality: Large (67-100%) Large (67-100%) N/A Necrotic A mount: Eschar Adherent Slough N/A Necrotic Tissue: Fat Layer (Subcutaneous Tissue): Yes Fat Layer (Subcutaneous Tissue): Yes N/A Exposed Structures: Fascia: No Fascia: No Tendon: No Tendon: No Muscle: No Muscle: No Joint: No Joint: No Bone: No Bone: No Medium (34-66%) None N/A Epithelialization: N/A N/A N/A Debridement: N/A N/A N/A Pain Control: N/A N/A N/A Tissue Debrided: N/A N/A N/A Level: N/A N/A N/A Debridement A (sq cm): rea N/A N/A N/A Instrument: N/A N/A N/A Bleeding: N/A N/A N/A Hemostasis A chieved: Debridement Treatment Response: N/A N/A N/A Post Debridement Measurements L x N/A N/A N/A W x D (cm) N/A N/A N/A Post Debridement Volume: (cm) N/A N/A N/A Procedures Performed: Treatment Notes Electronic Signature(s) Signed: 06/29/2021 12:04:27 PM By: Geralyn Corwin DO Signed: 07/04/2021 4:45:49 PM By: Fonnie Mu RN Entered By: Geralyn Corwin on 06/29/2021 11:47:27 -------------------------------------------------------------------------------- Multi-Disciplinary Care Plan Details Patient Name: Date of Service: Kelly [name] 06/29/2021 9:30 A M Medical Record Number: [id number] Patient  Account Number: [id number] Date of Birth/Sex: Treating RN: [birth year] (25 y.o. Kelly [name] Primary Care Awesome Jared: Kelly [name] Other Clinician: Referring Catheleen Langhorne: Treating Katey Barrie/Kelly [name] in Treatment: 19 Multidisciplinary Care Plan reviewed with physician Active Inactive Abuse / Safety / Falls / Self Care Management Nursing Diagnoses: History of Falls Potential for falls Goals: Patient/caregiver will verbalize/demonstrate measures taken to prevent injury and/or falls Date Initiated: 04/21/2021 Target Resolution Date: 07/27/2021 Goal Status: Active Interventions: Assess fall risk on admission and as needed Assess impairment of mobility on admission and as needed per policy Notes: Wound/Skin Impairment Nursing Diagnoses: Impaired tissue integrity Goals: Patient/caregiver will verbalize understanding of skin care regimen Date Initiated: 02/13/2021 Target Resolution Date: 07/27/2021 Goal Status: Active Ulcer/skin breakdown will have a volume reduction of 30% by week 4 Date Initiated: 02/13/2021 Date Inactivated: 04/21/2021 Target Resolution Date: 03/13/2021 Unmet Reason: pt did not return to Goal Status:  Unmet clinic for F/U Ulcer/skin breakdown will have a volume reduction of 80% by week 12 Date Initiated: 04/21/2021 Target Resolution Date: 07/27/2021 Goal Status: Active Interventions: Assess patient/caregiver ability to obtain necessary supplies Assess patient/caregiver ability to perform ulcer/skin care regimen upon admission and as needed Assess ulceration(s) every visit Provide education on ulcer and skin care Treatment Activities: Skin care regimen initiated : 02/13/2021 Topical wound management initiated : 02/13/2021 Notes: 06/29/21: Wounds 3 and 6 only at 80% volume reduction. Target date extended. Electronic Signature(s) Signed: 06/29/2021 10:11:36 AM By: Kelly [name] Previous Signature: 06/29/2021 10:09:58 AM  Version By: Kelly [name] Entered By: Kelly [name] on 06/29/2021 10:11:34 -------------------------------------------------------------------------------- Pain Assessment Details Patient Name: Date of Service: Kelly [name] 06/29/2021 9:30 A M Medical Record Number: [id number] Patient Account Number: [id number] Date of Birth/Sex: Treating RN: [birth year] [name] (25 y.o. Kelly [name], Kelly [name] Primary Care Azilee Pirro: Kelly [name] Other Clinician: Referring Lorette Peterkin: Treating Kashmere Daywalt/Kelly [name] in Treatment: 19 Active Problems Location of Pain Severity and Description of Pain Patient Has Paino No Site Locations Pain Management and Medication Current Pain Management: Electronic Signature(s) Signed: 06/29/2021 10:21:29 AM By: Kelly [name] Signed: 07/04/2021 4:45:49 PM By: Fonnie Mu RN Entered By: Kelly [name] on 06/29/2021 09:43:42 -------------------------------------------------------------------------------- Patient/Caregiver Education Details Patient Name: Date of Service: Kelly Relic. 11/3/2022andnbsp9:30 A M Medical Record Number: [id number] Patient Account Number: [id number] Date of Birth/Gender: Treating RN: [name] (25 y.o. Kelly [name] Primary Care Physician: Kelly [name] Other Clinician: Referring Physician: Treating Physician/Kelly [name] in Treatment: 19 Education Assessment Education Provided To: Patient Education Topics Provided Wound Debridement: Methods: Explain/Verbal, Printed Responses: State content correctly Wound/Skin Impairment: Methods: Explain/Verbal, Printed Responses: State content correctly Electronic Signature(s) Signed: 06/29/2021 6:31:34 PM By: Kelly [name] Entered By: Kelly [name] on 06/29/2021 10:12:05 -------------------------------------------------------------------------------- Wound Assessment Details Patient  Name: Date of Service: Kelly [name] 06/29/2021 9:30 A M Medical Record Number: [id number] Patient Account Number: [id number] Date of Birth/Sex: Treating RN: [name] (25 y.o. Kelly [name], Kelly [name] Primary Care Latina Frank: Kelly [name] Other Clinician: Referring Marriah Sanderlin: Treating Kortlynn Poust/Kelly [name] in Treatment: 19 Wound Status Wound Number: 3 Primary Etiology: Cellulitis Wound Location: Left, Posterior Lower Leg Wound Status: Open Wounding Event: Gradually Appeared Comorbid History: Asthma, Lupus Erythematosus Date Acquired: 04/14/2021 Weeks Of Treatment: 9 Clustered Wound: No Photos Wound Measurements Length: (cm) 0.7 Width: (cm) 0.6 Depth: (cm) 0.2 Area: (cm) 0.33 Volume: (cm) 0.066 % Reduction in Area: 73.1% % Reduction in Volume: 95.5% Epithelialization: None Tunneling: No Undermining: No Wound Description Classification: Full Thickness With Exposed Support Structures Wound Margin: Distinct, outline attached Exudate Amount: Medium Exudate Type: Serosanguineous Exudate Color: red, brown Foul Odor After Cleansing: No Slough/Fibrino Yes Wound Bed Granulation Amount: Large (67-100%) Exposed Structure Granulation Quality: Red, Pink Fascia Exposed: No Necrotic Amount: Small (1-33%) Fat Layer (Subcutaneous Tissue) Exposed: Yes Tendon Exposed: No Muscle Exposed: No Joint Exposed: No Bone Exposed: No Electronic Signature(s) Signed: 06/29/2021 6:31:34 PM By: Kelly [name] Signed: 07/04/2021 4:45:49 PM By: Fonnie Mu RN Entered By: Kelly [name] on 06/29/2021 09:59:28 -------------------------------------------------------------------------------- Wound Assessment Details Patient Name: Date of Service: Kelly [name] 06/29/2021 9:30 A M Medical Record Number: [id number] Patient Account Number: [id number] Date of Birth/Sex: Treating RN: [name] (25 y.o. Kelly [name], Kelly [name] Primary Care  Kratos Ruscitti: Kelly [name] Other Clinician: Referring Von Inscoe: Treating Damyen Knoll/Kelly [name] in Treatment: 19 Wound Status Wound Number: 4 Primary Etiology: Cellulitis Wound Location: Left,  Lateral Ankle Secondary Etiology: Lupus Wounding Event: Gradually Appeared Wound Status: Open Date Acquired: 04/14/2021 Comorbid History: Asthma, Lupus Erythematosus Weeks Of Treatment: 9 Clustered Wound: No Photos Wound Measurements Length: (cm) 4.5 Width: (cm) 4.2 Depth: (cm) 0.2 Area: (cm) 14.844 Volume: (cm) 2.969 % Reduction in Area: 5.5% % Reduction in Volume: 52.7% Epithelialization: None Tunneling: No Undermining: No Wound Description Classification: Full Thickness Without Exposed Support Structures Wound Margin: Distinct, outline attached Exudate Amount: Medium Exudate Type: Serosanguineous Exudate Color: red, brown Foul Odor After Cleansing: No Slough/Fibrino Yes Wound Bed Granulation Amount: Large (67-100%) Exposed Structure Granulation Quality: Red, Pink Fascia Exposed: No Necrotic Amount: Small (1-33%) Fat Layer (Subcutaneous Tissue) Exposed: Yes Necrotic Quality: Adherent Slough Tendon Exposed: No Muscle Exposed: No Joint Exposed: No Bone Exposed: No Electronic Signature(s) Signed: 06/29/2021 6:31:34 PM By: Kelly [name] Signed: 07/04/2021 4:45:49 PM By: Fonnie Mu RN Entered By: Kelly [name] on 06/29/2021 10:00:20 -------------------------------------------------------------------------------- Wound Assessment Details Patient Name: Date of Service: Kelly [name] 06/29/2021 9:30 A M Medical Record Number: [id number] Patient Account Number: [id number] Date of Birth/Sex: Treating RN: [birth year] [name] (25 y.o. Kelly [name], Kelly [name] Primary Care Parthenia Tellefsen: Kelly [name] Other Clinician: Referring Amaro Mangold: Treating Wilho Sharpley/Kelly [name] in Treatment: 19 Wound  Status Wound Number: 5 Primary Etiology: Cellulitis Wound Location: Left, Anterior Ankle Secondary Etiology: Lupus Wounding Event: Gradually Appeared Wound Status: Open Date Acquired: 04/14/2021 Comorbid History: Asthma, Lupus Erythematosus Weeks Of Treatment: 9 Clustered Wound: No Photos Wound Measurements Length: (cm) 2 Width: (cm) 1.4 Depth: (cm) 0.3 Area: (cm) 2.199 Volume: (cm) 0.66 % Reduction in Area: -133.4% % Reduction in Volume: -251.1% Epithelialization: None Tunneling: No Undermining: No Wound Description Classification: Full Thickness Without Exposed Support Structures Wound Margin: Distinct, outline attached Exudate Amount: Medium Exudate Type: Serosanguineous Exudate Color: red, brown Foul Odor After Cleansing: No Slough/Fibrino Yes Wound Bed Granulation Amount: Medium (34-66%) Exposed Structure Necrotic Amount: Medium (34-66%) Fascia Exposed: No Necrotic Quality: Adherent Slough Fat Layer (Subcutaneous Tissue) Exposed: Yes Tendon Exposed: No Muscle Exposed: No Joint Exposed: No Bone Exposed: No Electronic Signature(s) Signed: 06/29/2021 6:31:34 PM By: Kelly [name] Signed: 07/04/2021 4:45:49 PM By: Fonnie Mu RN Entered By: Kelly [name] on 06/29/2021 10:01:02 -------------------------------------------------------------------------------- Wound Assessment Details Patient Name: Date of Service: Kelly [name] 06/29/2021 9:30 A M Medical Record Number: [id number] Patient Account Number: [id number] Date of Birth/Sex: Treating RN: [birth year][name] (25 y.o. Kelly [name], Kelly [name] Primary Care Dillard Pascal: Kelly [name] Other Clinician: Referring Jvon Meroney: Treating Auren Valdes/Kelly [name] in Treatment: 19 Wound Status Wound Number: 6 Primary Etiology: Lupus Wound Location: Left, Distal, Posterior Lower Leg Wound Status: Open Wounding Event: Gradually Appeared Comorbid History: Asthma, Lupus  Erythematosus Date Acquired: 04/14/2021 Weeks Of Treatment: 9 Clustered Wound: No Photos Wound Measurements Length: (cm) 0.5 Width: (cm) 0.3 Depth: (cm) 0.2 Area: (cm) 0.118 Volume: (cm) 0.024 % Reduction in Area: 81.2% % Reduction in Volume: 81% Epithelialization: Medium (34-66%) Tunneling: No Undermining: No Wound Description Classification: Full Thickness Without Exposed Support Structures Wound Margin: Distinct, outline attached Exudate Amount: Medium Exudate Type: Serosanguineous Exudate Color: red, brown Foul Odor After Cleansing: No Slough/Fibrino Yes Wound Bed Granulation Amount: Small (1-33%) Exposed Structure Granulation Quality: Red Fascia Exposed: No Necrotic Amount: Large (67-100%) Fat Layer (Subcutaneous Tissue) Exposed: Yes Necrotic Quality: Eschar Tendon Exposed: No Muscle Exposed: No Joint Exposed: No Bone Exposed: No Electronic Signature(s) Signed: 06/29/2021 6:31:34 PM By: Kelly [name] Signed: 07/04/2021 4:45:49 PM By: Fonnie Mu RN Entered By: Kelly [name] on 06/29/2021  10:01:49 -------------------------------------------------------------------------------- Wound Assessment Details Patient Name: Date of Service: Kelly [name], Kelly [name]. 06/29/2021 9:30 A M Medical Record Number: [id number] Patient Account Number: [id number] Date of Birth/Sex: Treating RN: [birth year] [name] (25 y.o. Kelly [name], Kelly [name] Primary Care Joelys Staubs: Kelly [name] Other Clinician: Referring Numa Heatwole: Treating Rylan Kaufmann/Kelly [name] in Treatment: 19 Wound Status Wound Number: 7 Primary Etiology: Lupus Wound Location: Left, Anterior Lower Leg Wound Status: Open Wounding Event: Other Lesion Comorbid History: Asthma, Lupus Erythematosus Date Acquired: 05/18/2021 Weeks Of Treatment: 6 Clustered Wound: No Photos Wound Measurements Length: (cm) 1.6 Width: (cm) 1 Depth: (cm) 0.3 Area: (cm) 1.257 Volume: (cm) 0.377 %  Reduction in Area: -2.6% % Reduction in Volume: -2.4% Epithelialization: None Tunneling: No Undermining: No Wound Description Classification: Full Thickness Without Exposed Support Structures Wound Margin: Well defined, not attached Exudate Amount: Medium Exudate Type: Purulent Exudate Color: yellow, brown, green Foul Odor After Cleansing: No Slough/Fibrino Yes Wound Bed Granulation Amount: Small (1-33%) Exposed Structure Granulation Quality: Red Fascia Exposed: No Necrotic Amount: Large (67-100%) Fat Layer (Subcutaneous Tissue) Exposed: Yes Necrotic Quality: Adherent Slough Tendon Exposed: No Muscle Exposed: No Joint Exposed: No Bone Exposed: No Electronic Signature(s) Signed: 06/29/2021 6:31:34 PM By: Kelly [name] Signed: 07/04/2021 4:45:49 PM By: Fonnie Mu RN Entered By: Kelly [name] on 06/29/2021 10:02:18 -------------------------------------------------------------------------------- Vitals Details Patient Name: Date of Service: Kelly [name] 06/29/2021 9:30 A M Medical Record Number: [id number] Patient Account Number: [id number] Date of Birth/Sex: Treating RN: [birth year] (25 y.o. Kelly [name], Kelly [name] Primary Care Kimberleigh Mehan: Kelly [name] Other Clinician: Referring Hondo Nanda: Treating Larhonda Dettloff/Kelly [name] in Treatment: 19 Vital Signs Time Taken: 09:43 Temperature (F): 98.3 Height (in): 62 Pulse (bpm): 76 Respiratory Rate (breaths/min): 17 Blood Pressure (mmHg): 122/86 Reference Range: 80 - 120 mg / dl Electronic Signature(s) Signed: 06/29/2021 10:21:29 AM By: Kelly [name] Entered By: Kelly [name] on 06/29/2021 09:43:36

## 2021-07-04 NOTE — Progress Notes (Signed)
KINDRED, HEYING ([id number]) Visit Report for 06/29/2021 Chief Complaint Document Details Patient Name: Date of Service: Kelly [name], Kelly [name]. 06/29/2021 9:30 A [name] Medical Record Number: [id number] Patient Account Number: [id number] Date of Birth/Sex: Treating RN: 10-Mar-1996 (25 y.o. [name], [name] Primary Care Provider: Dione Booze Other Clinician: Referring Provider: Treating Provider/Extender: Gennaro Africa in Treatment: 19 Information Obtained from: Patient Chief Complaint Bilateral lower extremity wounds 04/21/21: left lower extremity wound s/p cellulitis Electronic Signature(s) Signed: 06/29/2021 12:04:27 PM By: Geralyn Corwin DO Entered By: Geralyn Corwin on 06/29/2021 11:48:05 -------------------------------------------------------------------------------- Debridement Details Patient Name: Date of Service: Kelly [name]. 06/29/2021 9:30 A [name] Medical Record Number: [id number] Patient Account Number: [id number] Date of Birth/Sex: Treating RN: 1996-02-23 (25 y.o. [name] Primary Care Provider: Dione Booze Other Clinician: Referring Provider: Treating Provider/Extender: Gennaro Africa in Treatment: 19 Debridement Performed for Assessment: Wound #5 Left,Anterior Ankle Performed By: Physician Geralyn Corwin, DO Debridement Type: Debridement Level of Consciousness (Pre-procedure): Awake and Alert Pre-procedure Verification/Time Out Yes - 10:17 Taken: Start Time: 10:18 Pain Control: Other : Benzocaine T Area Debrided (L x W): otal 2 (cm) x 1.4 (cm) = 2.8 (cm) Tissue and other material debrided: Non-Viable, Slough, Subcutaneous, Slough Level: Skin/Subcutaneous Tissue Debridement Description: Excisional Instrument: Curette Bleeding: Minimum Hemostasis Achieved: Pressure End Time: 10:22 Response to Treatment: Procedure was tolerated well Level of Consciousness (Post- Awake and  Alert procedure): Post Debridement Measurements of Total Wound Length: (cm) 2 Width: (cm) 1.4 Depth: (cm) 0.3 Volume: (cm) 0.66 Character of Wound/Ulcer Post Debridement: Stable Post Procedure Diagnosis Same as Pre-procedure Electronic Signature(s) Signed: 06/29/2021 12:04:27 PM By: Geralyn Corwin DO Signed: 06/29/2021 6:31:34 PM By: Antonieta Iba Entered By: Antonieta Iba on 06/29/2021 10:25:45 -------------------------------------------------------------------------------- Debridement Details Patient Name: Date of Service: Kelly [name]. 06/29/2021 9:30 A [name] Medical Record Number: [id number] Patient Account Number: [id number] Date of Birth/Sex: Treating RN: Jun 09, 1996 (25 y.o. [name] Primary Care Provider: Dione Booze Other Clinician: Referring Provider: Treating Provider/Extender: Gennaro Africa in Treatment: 19 Debridement Performed for Assessment: Wound #4 Left,Lateral Ankle Performed By: Physician Geralyn Corwin, DO Debridement Type: Debridement Level of Consciousness (Pre-procedure): Awake and Alert Pre-procedure Verification/Time Out Yes - 10:17 Taken: Start Time: 10:22 Pain Control: Other : Benzocaine T Area Debrided (L x W): otal 4.5 (cm) x 4.2 (cm) = 18.9 (cm) Tissue and other material debrided: Non-Viable, Slough, Subcutaneous, Slough Level: Skin/Subcutaneous Tissue Debridement Description: Excisional Instrument: Curette Bleeding: Minimum Hemostasis Achieved: Pressure End Time: 10:25 Response to Treatment: Procedure was tolerated well Level of Consciousness (Post- Awake and Alert procedure): Post Debridement Measurements of Total Wound Length: (cm) 4.5 Width: (cm) 4.2 Depth: (cm) 0.2 Volume: (cm) 2.969 Character of Wound/Ulcer Post Debridement: Stable Post Procedure Diagnosis Same as Pre-procedure Electronic Signature(s) Signed: 06/29/2021 12:04:27 PM By: Geralyn Corwin DO Signed: 06/29/2021  6:31:34 PM By: Antonieta Iba Entered By: Antonieta Iba on 06/29/2021 10:27:33 -------------------------------------------------------------------------------- Debridement Details Patient Name: Date of Service: Kelly [name]. 06/29/2021 9:30 A [name] Medical Record Number: [id number] Patient Account Number: [id number] Date of Birth/Sex: Treating RN: 10-14-1995 (25 y.o. [name] Primary Care Provider: Dione Booze Other Clinician: Referring Provider: Treating Provider/Extender: Gennaro Africa in Treatment: 19 Debridement Performed for Assessment: Wound #3 Left,Posterior Lower Leg Performed By: Physician Geralyn Corwin, DO Debridement Type: Debridement Level of Consciousness (Pre-procedure): Awake and Alert Pre-procedure Verification/Time Out Yes - 10:17 Taken: Start Time: 10:25 Pain Control: Other :  Benzocaine T Area Debrided (L x W): otal 0.7 (cm) x 0.6 (cm) = 0.42 (cm) Tissue and other material debrided: Non-Viable, Slough, Skin: Dermis , Slough Level: Skin/Dermis Debridement Description: Selective/Open Wound Instrument: Curette Bleeding: Minimum Hemostasis Achieved: Pressure End Time: 10:28 Response to Treatment: Procedure was tolerated well Level of Consciousness (Post- Awake and Alert procedure): Post Debridement Measurements of Total Wound Length: (cm) 0.7 Width: (cm) 0.6 Depth: (cm) 0.2 Volume: (cm) 0.066 Character of Wound/Ulcer Post Debridement: Stable Post Procedure Diagnosis Same as Pre-procedure Electronic Signature(s) Signed: 06/29/2021 12:04:27 PM By: Geralyn Corwin DO Signed: 06/29/2021 6:31:34 PM By: Antonieta Iba Entered By: Antonieta Iba on 06/29/2021 10:28:49 -------------------------------------------------------------------------------- HPI Details Patient Name: Date of Service: Kelly [name]. 06/29/2021 9:30 A [name] Medical Record Number: [id number] Patient Account Number: [id number] Date of  Birth/Sex: Treating RN: 08/24/1996 (25 y.o. [name], [name] Primary Care Provider: Dione Booze Other Clinician: Referring Provider: Treating Provider/Extender: Gennaro Africa in Treatment: 19 History of Present Illness HPI Description: Admission 6/21 Kelly [name] is A 25 year old female with a past medical history of systemic lupus erythematosus that presents with bilateral lower extremity wounds that started in April 2022. She has had skin issues in the past where she developed very small wounds but these healed with time. She has never had open wounds like she does on her legs. She currently reports minimal drainage to the wounds. She does have pain to these areas but has been overall stable. She denies signs of infection. She has visited the ED for this issue and was recently prescribed doxycycline for possible cellulitis. She follows with [facility] for her SLE and is currently on Plaquenil, prednisone and CellCept. 6/27; patient presents for 1 week follow-up. She states she saw her rheumatologist on 6/22 and prednisone was increased from 10 mg to 20 mg daily. She is currently at the highest dose of Plaquenil and CellCept and is going to try an infusion later today at her regimen. She reports improvement to the wounds. She has been using collagen with dressing changes. She currently denies signs of infection. Readmission 8/26 Patient presents to clinic today for open wounds to her left lower extremity. Her previous wounds that were treated have closed. She states that on 8/9 she developed cellulitis and was treated with 2 rounds of antibiotics. Her cellulitis has resolved. She had blisters that opened and are now large wounds. She has seen her dermatologist and rheumatologist since she has systemic lupus and a hard time healing. No changes have been made to her medications. She was recommended antibiotic ointment by her dermatologist to her  wounds She picked up the antibiotic ointment today.. She reports pain to these areas. She denies purulent drainage, increased warmth or erythema to the left lower extremity. She currently keeps the areas covered. Of note she had an Achilles tendon rupture on her right leg and is currently in a soft cast. 9/13; patient presents for follow-up. She missed her last clinic appointment. She has been using Santyl to the wound beds daily. She has no issues or complaints today. She reports the Santyl is helping clean up the wounds. She currently denies signs of infection. 9/22; patient states that 1 week ago she was seen by dermatology and they placed an Unna boot with Bactroban on her left lower extremity. Today she presents with a Unna boot in place. She currently denies signs of infection. 10/7; patient presents for follow-up. She reports improvement. She has been using Santyl to  the areas of nonviable tissue and collagen to granulation tissue. She has been approved for infusions for her lupus. She is going to start these soon. These will be injection she can do at home. She currently denies signs of infection. 10/21; patient presents for follow-up. She reports continued improvement to her wound healing. She has been using Santyl to all areas except for the superior posterior left leg wound and she is using collagen to this. She has started her injection therapy for lupus. She started 1 week ago. She currently denies signs of infection. 11/3; patient presents for follow-up. She has no issues or complaints today. She is on her third week of injection therapy for lupus and tolerating this well. She has no issues or complaints today. She denies signs of infection. Electronic Signature(s) Signed: 06/29/2021 12:04:27 PM By: Geralyn Corwin DO Entered By: Geralyn Corwin on 06/29/2021 11:50:04 -------------------------------------------------------------------------------- Physical Exam Details Patient  Name: Date of Service: Kelly [name]. 06/29/2021 9:30 A [name] Medical Record Number: [id number] Patient Account Number: [id number] Date of Birth/Sex: Treating RN: 11/29/1995 (25 y.o. [name], [name] Primary Care Provider: Dione Booze Other Clinician: Referring Provider: Treating Provider/Extender: Gennaro Africa in Treatment: 19 Constitutional respirations regular, non-labored and within target range for patient.. Cardiovascular 2+ dorsalis pedis/posterior tibialis pulses. Psychiatric pleasant and cooperative. Notes Left lower extremity: 5 wounds present throughout. 3 out of the 5 have slough throughout. The posterior wounds have granulation tissue. Left lower extremity: The most distal of the these wounds is almost healed. No obvious signs of infection. Overall appearance is improved since last clinic visit. Electronic Signature(s) Signed: 06/29/2021 12:04:27 PM By: Geralyn Corwin DO Entered By: Geralyn Corwin on 06/29/2021 11:54:32 -------------------------------------------------------------------------------- Physician Orders Details Patient Name: Date of Service: Kelly [name]. 06/29/2021 9:30 A [name] Medical Record Number: [id number] Patient Account Number: [id number] Date of Birth/Sex: Treating RN: June 04, 1996 (25 y.o. [name] Primary Care Provider: Dione Booze Other Clinician: Referring Provider: Treating Provider/Extender: Gennaro Africa in Treatment: 19 Verbal / Phone Orders: No Diagnosis Coding ICD-10 Coding Code Description [phone] Non-pressure chronic ulcer of other part of left lower leg with fat layer exposed L97.812 Non-pressure chronic ulcer of other part of right lower leg with fat layer exposed M32.9 Systemic lupus erythematosus, unspecified Follow-up Appointments ppointment in 2 weeks. - Dr. Mikey Bussing Return A Bathing/ Shower/ Hygiene May shower and wash wound with soap and  water. Edema Control - Lymphedema / SCD / Other Elevate legs to the level of the heart or above for 30 minutes daily and/or when sitting, a frequency of: - 3-4 times a day throughout the day. Avoid standing for long periods of time. Additional Orders / Instructions Follow Nutritious Diet Wound Treatment Wound #3 - Lower Leg Wound Laterality: Left, Posterior Cleanser: Soap and Water 1 x Per Day/30 Days Discharge Instructions: May shower and wash wound with dial antibacterial soap and water prior to dressing change. Prim Dressing: Promogran Prisma Matrix, 4.34 (sq in) (silver collagen) 1 x Per Day/30 Days ary Discharge Instructions: Moisten collagen with saline or hydrogel Secondary Dressing: Woven Gauze Sponge, Non-Sterile 4x4 in (Generic) 1 x Per Day/30 Days Discharge Instructions: Apply over primary dressing as directed. Secured With: [employer], 4.5x3.1 (in/yd) (Generic) 1 x Per Day/30 Days Discharge Instructions: Secure with Kerlix as directed. Secured With: Paper Tape, 2x10 (in/yd) (Generic) 1 x Per Day/30 Days Discharge Instructions: Secure dressing with tape as directed. Wound #4 - Ankle Wound Laterality: Left,  Lateral Cleanser: Soap and Water 1 x Per Day/30 Days Discharge Instructions: May shower and wash wound with dial antibacterial soap and water prior to dressing change. Prim Dressing: Santyl Ointment 1 x Per Day/30 Days ary Discharge Instructions: Apply nickel thick amount to wound bed as instructed Secondary Dressing: Woven Gauze Sponge, Non-Sterile 4x4 in (Generic) 1 x Per Day/30 Days Discharge Instructions: Apply over primary dressing as directed. Secured With: [employer], 4.5x3.1 (in/yd) (Generic) 1 x Per Day/30 Days Discharge Instructions: Secure with Kerlix as directed. Secured With: Paper Tape, 2x10 (in/yd) (Generic) 1 x Per Day/30 Days Discharge Instructions: Secure dressing with tape as directed. Wound #5 - Ankle Wound Laterality: Left,  Anterior Cleanser: Soap and Water 1 x Per Day/30 Days Discharge Instructions: May shower and wash wound with dial antibacterial soap and water prior to dressing change. Prim Dressing: Santyl Ointment 1 x Per Day/30 Days ary Discharge Instructions: Apply nickel thick amount to wound bed as instructed Secondary Dressing: Woven Gauze Sponge, Non-Sterile 4x4 in (Generic) 1 x Per Day/30 Days Discharge Instructions: Apply over primary dressing as directed. Secured With: [employer], 4.5x3.1 (in/yd) (Generic) 1 x Per Day/30 Days Discharge Instructions: Secure with Kerlix as directed. Secured With: Paper Tape, 2x10 (in/yd) (Generic) 1 x Per Day/30 Days Discharge Instructions: Secure dressing with tape as directed. Wound #6 - Lower Leg Wound Laterality: Left, Posterior, Distal Cleanser: Soap and Water 1 x Per Day/30 Days Discharge Instructions: May shower and wash wound with dial antibacterial soap and water prior to dressing change. Prim Dressing: Santyl Ointment 1 x Per Day/30 Days ary Discharge Instructions: Apply nickel thick amount to wound bed as instructed Secondary Dressing: Woven Gauze Sponge, Non-Sterile 4x4 in (Generic) 1 x Per Day/30 Days Discharge Instructions: Apply over primary dressing as directed. Secured With: [employer], 4.5x3.1 (in/yd) (Generic) 1 x Per Day/30 Days Discharge Instructions: Secure with Kerlix as directed. Secured With: Paper Tape, 2x10 (in/yd) (Generic) 1 x Per Day/30 Days Discharge Instructions: Secure dressing with tape as directed. Wound #7 - Lower Leg Wound Laterality: Left, Anterior Cleanser: Soap and Water 1 x Per Day/30 Days Discharge Instructions: May shower and wash wound with dial antibacterial soap and water prior to dressing change. Prim Dressing: Promogran Prisma Matrix, 4.34 (sq in) (silver collagen) 1 x Per Day/30 Days ary Discharge Instructions: Moisten collagen with saline or hydrogel Secondary Dressing: Woven Gauze Sponge,  Non-Sterile 4x4 in (Generic) 1 x Per Day/30 Days Discharge Instructions: Apply over primary dressing as directed. Secured With: [employer], 4.5x3.1 (in/yd) (Generic) 1 x Per Day/30 Days Discharge Instructions: Secure with Kerlix as directed. Secured With: Paper Tape, 2x10 (in/yd) (Generic) 1 x Per Day/30 Days Discharge Instructions: Secure dressing with tape as directed. Patient Medications llergies: No Known Allergies A Notifications Medication Indication Start End 06/29/2021 Santyl DOSE 1 - topical 250 unit/gram ointment - 1 application daily x 7 days Electronic Signature(s) Signed: 06/29/2021 12:02:13 PM By: Geralyn Corwin DO Entered By: Geralyn Corwin on 06/29/2021 12:02:11 -------------------------------------------------------------------------------- Problem List Details Patient Name: Date of Service: Kelly [name]. 06/29/2021 9:30 A [name] Medical Record Number: [id number] Patient Account Number: [id number] Date of Birth/Sex: Treating RN: 12-Aug-1996 (25 y.o. [name] Primary Care Provider: Dione Booze Other Clinician: Referring Provider: Treating Provider/Extender: Gennaro Africa in Treatment: 19 Active Problems ICD-10 Encounter Code Description Active Date MDM Diagnosis [phone] Non-pressure chronic ulcer of other part of left lower leg with fat layer exposed11/10/2020 No Yes L97.812 Non-pressure chronic  ulcer of other part of right lower leg with fat layer 06/29/2021 No Yes exposed M32.9 Systemic lupus erythematosus, unspecified 02/14/2021 No Yes Inactive Problems ICD-10 Code Description Active Date Inactive Date L97.819 Non-pressure chronic ulcer of other part of right lower leg with unspecified severity 02/13/2021 02/13/2021 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 with unspecified severity 02/13/2021 02/13/2021 T01.601U Unspecified open wound, left lower leg, initial encounter 04/21/2021  04/21/2021 S91.002A Unspecified open wound, left ankle, initial encounter 04/21/2021 04/21/2021 Resolved Problems Electronic Signature(s) Signed: 06/29/2021 12:04:27 PM By: Geralyn Corwin DO Previous Signature: 06/29/2021 10:08:38 AM Version By: Antonieta Iba Entered By: Geralyn Corwin on 06/29/2021 11:47:14 -------------------------------------------------------------------------------- Progress Note Details Patient Name: Date of Service: Kelly [name]. 06/29/2021 9:30 A [name] Medical Record Number: [id number] Patient Account Number: [id number] Date of Birth/Sex: Treating RN: 14-Aug-1996 (25 y.o. [name], [name] Primary Care Provider: Dione Booze Other Clinician: Referring Provider: Treating Provider/Extender: Gennaro Africa in Treatment: 19 Subjective Chief Complaint Information obtained from Patient Bilateral lower extremity wounds 04/21/21: left lower extremity wound s/p cellulitis History of Present Illness (HPI) Admission 6/21 Ms. Hollan Philipp is A 25 year old female with a past medical history of systemic lupus erythematosus that presents with bilateral lower extremity wounds that started in April 2022. She has had skin issues in the past where she developed very small wounds but these healed with time. She has never had open wounds like she does on her legs. She currently reports minimal drainage to the wounds. She does have pain to these areas but has been overall stable. She denies signs of infection. She has visited the ED for this issue and was recently prescribed doxycycline for possible cellulitis. She follows with [facility] for her SLE and is currently on Plaquenil, prednisone and CellCept. 6/27; patient presents for 1 week follow-up. She states she saw her rheumatologist on 6/22 and prednisone was increased from 10 mg to 20 mg daily. She is currently at the highest dose of Plaquenil and CellCept and is going to try an  infusion later today at her regimen. She reports improvement to the wounds. She has been using collagen with dressing changes. She currently denies signs of infection. Readmission 8/26 Patient presents to clinic today for open wounds to her left lower extremity. Her previous wounds that were treated have closed. She states that on 8/9 she developed cellulitis and was treated with 2 rounds of antibiotics. Her cellulitis has resolved. She had blisters that opened and are now large wounds. She has seen her dermatologist and rheumatologist since she has systemic lupus and a hard time healing. No changes have been made to her medications. She was recommended antibiotic ointment by her dermatologist to her wounds She picked up the antibiotic ointment today.. She reports pain to these areas. She denies purulent drainage, increased warmth or erythema to the left lower extremity. She currently keeps the areas covered. Of note she had an Achilles tendon rupture on her right leg and is currently in a soft cast. 9/13; patient presents for follow-up. She missed her last clinic appointment. She has been using Santyl to the wound beds daily. She has no issues or complaints today. She reports the Santyl is helping clean up the wounds. She currently denies signs of infection. 9/22; patient states that 1 week ago she was seen by dermatology and they placed an Unna boot with Bactroban on her left lower extremity. Today she presents with a Unna boot in place. She  currently denies signs of infection. 10/7; patient presents for follow-up. She reports improvement. She has been using Santyl to the areas of nonviable tissue and collagen to granulation tissue. She has been approved for infusions for her lupus. She is going to start these soon. These will be injection she can do at home. She currently denies signs of infection. 10/21; patient presents for follow-up. She reports continued improvement to her wound healing. She  has been using Santyl to all areas except for the superior posterior left leg wound and she is using collagen to this. She has started her injection therapy for lupus. She started 1 week ago. She currently denies signs of infection. 11/3; patient presents for follow-up. She has no issues or complaints today. She is on her third week of injection therapy for lupus and tolerating this well. She has no issues or complaints today. She denies signs of infection. Patient History Information obtained from Patient. Family History Diabetes - Paternal Grandparents, Hypertension - Paternal Grandparents, Thyroid Problems - Maternal Grandparents,Paternal Grandparents, No family history of Cancer, Heart Disease, Hereditary Spherocytosis, Kidney Disease, Lung Disease, Seizures, Stroke, Tuberculosis. Social History Never smoker, Marital Status - Married, Alcohol Use - Moderate, Drug Use - Current History - Marijuana, Caffeine Use - Daily - Coffee. Medical History Respiratory Patient has history of Asthma Immunological Patient has history of Lupus Erythematosus Objective Constitutional respirations regular, non-labored and within target range for patient.. Vitals Time Taken: 9:43 AM, Height: 62 in, Temperature: 98.3 F, Pulse: 76 bpm, Respiratory Rate: 17 breaths/min, Blood Pressure: 122/86 mmHg. Cardiovascular 2+ dorsalis pedis/posterior tibialis pulses. Psychiatric pleasant and cooperative. General Notes: Left lower extremity: 5 wounds present throughout. 3 out of the 5 have slough throughout. The posterior wounds have granulation tissue. Left lower extremity: The most distal of the these wounds is almost healed. No obvious signs of infection. Overall appearance is improved since last clinic visit. Integumentary (Hair, Skin) Wound #3 status is Open. Original cause of wound was Gradually Appeared. The date acquired was: 04/14/2021. The wound has been in treatment 9 weeks. The wound is located on the  Left,Posterior Lower Leg. The wound measures 0.7cm length x 0.6cm width x 0.2cm depth; 0.33cm^2 area and 0.066cm^3 volume. There is Fat Layer (Subcutaneous Tissue) exposed. There is no tunneling or undermining noted. There is a medium amount of serosanguineous drainage noted. The wound margin is distinct with the outline attached to the wound base. There is large (67-100%) red, pink granulation within the wound bed. There is a small (1-33%) amount of necrotic tissue within the wound bed. Wound #4 status is Open. Original cause of wound was Gradually Appeared. The date acquired was: 04/14/2021. The wound has been in treatment 9 weeks. The wound is located on the Left,Lateral Ankle. The wound measures 4.5cm length x 4.2cm width x 0.2cm depth; 14.844cm^2 area and 2.969cm^3 volume. There is Fat Layer (Subcutaneous Tissue) exposed. There is no tunneling or undermining noted. There is a medium amount of serosanguineous drainage noted. The wound margin is distinct with the outline attached to the wound base. There is large (67-100%) red, pink granulation within the wound bed. There is a small (1- 33%) amount of necrotic tissue within the wound bed including Adherent Slough. Wound #5 status is Open. Original cause of wound was Gradually Appeared. The date acquired was: 04/14/2021. The wound has been in treatment 9 weeks. The wound is located on the Left,Anterior Ankle. The wound measures 2cm length x 1.4cm width x 0.3cm depth; 2.199cm^2 area and 0.66cm^3  volume. There is Fat Layer (Subcutaneous Tissue) exposed. There is no tunneling or undermining noted. There is a medium amount of serosanguineous drainage noted. The wound margin is distinct with the outline attached to the wound base. There is medium (34-66%) granulation within the wound bed. There is a medium (34-66%) amount of necrotic tissue within the wound bed including Adherent Slough. Wound #6 status is Open. Original cause of wound was Gradually  Appeared. The date acquired was: 04/14/2021. The wound has been in treatment 9 weeks. The wound is located on the Left,Distal,Posterior Lower Leg. The wound measures 0.5cm length x 0.3cm width x 0.2cm depth; 0.118cm^2 area and 0.024cm^3 volume. There is Fat Layer (Subcutaneous Tissue) exposed. There is no tunneling or undermining noted. There is a medium amount of serosanguineous drainage noted. The wound margin is distinct with the outline attached to the wound base. There is small (1-33%) red granulation within the wound bed. There is a large (67-100%) amount of necrotic tissue within the wound bed including Eschar. Wound #7 status is Open. Original cause of wound was Other Lesion. The date acquired was: 05/18/2021. The wound has been in treatment 6 weeks. The wound is located on the Left,Anterior Lower Leg. The wound measures 1.6cm length x 1cm width x 0.3cm depth; 1.257cm^2 area and 0.377cm^3 volume. There is Fat Layer (Subcutaneous Tissue) exposed. There is no tunneling or undermining noted. There is a medium amount of purulent drainage noted. The wound margin is well defined and not attached to the wound base. There is small (1-33%) red granulation within the wound bed. There is a large (67-100%) amount of necrotic tissue within the wound bed including Adherent Slough. Assessment Active Problems ICD-10 Non-pressure chronic ulcer of other part of left lower leg with fat layer exposed Non-pressure chronic ulcer of other part of right lower leg with fat layer exposed Systemic lupus erythematosus, unspecified Patient's wounds have shown improvement in appearance and size since last clinic visit. I debrided nonviable tissue. I recommended continuing collagen to the posterior wound and now the more proximal anterior wound. The most distal posterior wound is almost healed and I did not recommend placing anything on this. The rest of the wounds can continue with Santyl. She states that Santyl is not  lasting more than 7 days. I will rewrite a prescription stating this. No signs of infection on exam. Follow-up in 2 weeks Procedures Wound #3 Pre-procedure diagnosis of Wound #3 is a Cellulitis located on the Left,Posterior Lower Leg . There was a Selective/Open Wound Skin/Dermis Debridement with a total area of 0.42 sq cm performed by Geralyn Corwin, DO. With the following instrument(s): Curette to remove Non-Viable tissue/material. Material removed includes Slough and Skin: Dermis and after achieving pain control using Other (Benzocaine). No specimens were taken. A time out was conducted at 10:17, prior to the start of the procedure. A Minimum amount of bleeding was controlled with Pressure. The procedure was tolerated well. Post Debridement Measurements: 0.7cm length x 0.6cm width x 0.2cm depth; 0.066cm^3 volume. Character of Wound/Ulcer Post Debridement is stable. Post procedure Diagnosis Wound #3: Same as Pre-Procedure Wound #4 Pre-procedure diagnosis of Wound #4 is a Cellulitis located on the Left,Lateral Ankle . There was a Excisional Skin/Subcutaneous Tissue Debridement with a total area of 18.9 sq cm performed by Geralyn Corwin, DO. With the following instrument(s): Curette to remove Non-Viable tissue/material. Material removed includes Subcutaneous Tissue and Slough and after achieving pain control using Other (Benzocaine). No specimens were taken. A time out was conducted  at 10:17, prior to the start of the procedure. A Minimum amount of bleeding was controlled with Pressure. The procedure was tolerated well. Post Debridement Measurements: 4.5cm length x 4.2cm width x 0.2cm depth; 2.969cm^3 volume. Character of Wound/Ulcer Post Debridement is stable. Post procedure Diagnosis Wound #4: Same as Pre-Procedure Wound #5 Pre-procedure diagnosis of Wound #5 is a Cellulitis located on the Left,Anterior Ankle . There was a Excisional Skin/Subcutaneous Tissue Debridement with a total area  of 2.8 sq cm performed by Geralyn Corwin, DO. With the following instrument(s): Curette to remove Non-Viable tissue/material. Material removed includes Subcutaneous Tissue and Slough and after achieving pain control using Other (Benzocaine). No specimens were taken. A time out was conducted at 10:17, prior to the start of the procedure. A Minimum amount of bleeding was controlled with Pressure. The procedure was tolerated well. Post Debridement Measurements: 2cm length x 1.4cm width x 0.3cm depth; 0.66cm^3 volume. Character of Wound/Ulcer Post Debridement is stable. Post procedure Diagnosis Wound #5: Same as Pre-Procedure Plan Follow-up Appointments: Return Appointment in 2 weeks. - Dr. Mikey Bussing Bathing/ Shower/ Hygiene: May shower and wash wound with soap and water. Edema Control - Lymphedema / SCD / Other: Elevate legs to the level of the heart or above for 30 minutes daily and/or when sitting, a frequency of: - 3-4 times a day throughout the day. Avoid standing for long periods of time. Additional Orders / Instructions: Follow Nutritious Diet The following medication(s) was prescribed: Santyl topical 250 unit/gram ointment 1 1 application daily x 7 days starting 06/29/2021 WOUND #3: - Lower Leg Wound Laterality: Left, Posterior Cleanser: Soap and Water 1 x Per Day/30 Days Discharge Instructions: May shower and wash wound with dial antibacterial soap and water prior to dressing change. Prim Dressing: Promogran Prisma Matrix, 4.34 (sq in) (silver collagen) 1 x Per Day/30 Days ary Discharge Instructions: Moisten collagen with saline or hydrogel Secondary Dressing: Woven Gauze Sponge, Non-Sterile 4x4 in (Generic) 1 x Per Day/30 Days Discharge Instructions: Apply over primary dressing as directed. Secured With: [employer], 4.5x3.1 (in/yd) (Generic) 1 x Per Day/30 Days Discharge Instructions: Secure with Kerlix as directed. Secured With: Paper T ape, 2x10 (in/yd) (Generic) 1 x Per  Day/30 Days Discharge Instructions: Secure dressing with tape as directed. WOUND #4: - Ankle Wound Laterality: Left, Lateral Cleanser: Soap and Water 1 x Per Day/30 Days Discharge Instructions: May shower and wash wound with dial antibacterial soap and water prior to dressing change. Prim Dressing: Santyl Ointment 1 x Per Day/30 Days ary Discharge Instructions: Apply nickel thick amount to wound bed as instructed Secondary Dressing: Woven Gauze Sponge, Non-Sterile 4x4 in (Generic) 1 x Per Day/30 Days Discharge Instructions: Apply over primary dressing as directed. Secured With: [employer], 4.5x3.1 (in/yd) (Generic) 1 x Per Day/30 Days Discharge Instructions: Secure with Kerlix as directed. Secured With: Paper T ape, 2x10 (in/yd) (Generic) 1 x Per Day/30 Days Discharge Instructions: Secure dressing with tape as directed. WOUND #5: - Ankle Wound Laterality: Left, Anterior Cleanser: Soap and Water 1 x Per Day/30 Days Discharge Instructions: May shower and wash wound with dial antibacterial soap and water prior to dressing change. Prim Dressing: Santyl Ointment 1 x Per Day/30 Days ary Discharge Instructions: Apply nickel thick amount to wound bed as instructed Secondary Dressing: Woven Gauze Sponge, Non-Sterile 4x4 in (Generic) 1 x Per Day/30 Days Discharge Instructions: Apply over primary dressing as directed. Secured With: [employer], 4.5x3.1 (in/yd) (Generic) 1 x Per Day/30 Days Discharge Instructions: Secure with Kerlix  as directed. Secured With: Paper T ape, 2x10 (in/yd) (Generic) 1 x Per Day/30 Days Discharge Instructions: Secure dressing with tape as directed. WOUND #6: - Lower Leg Wound Laterality: Left, Posterior, Distal Cleanser: Soap and Water 1 x Per Day/30 Days Discharge Instructions: May shower and wash wound with dial antibacterial soap and water prior to dressing change. Prim Dressing: Santyl Ointment 1 x Per Day/30 Days ary Discharge Instructions: Apply  nickel thick amount to wound bed as instructed Secondary Dressing: Woven Gauze Sponge, Non-Sterile 4x4 in (Generic) 1 x Per Day/30 Days Discharge Instructions: Apply over primary dressing as directed. Secured With: [employer], 4.5x3.1 (in/yd) (Generic) 1 x Per Day/30 Days Discharge Instructions: Secure with Kerlix as directed. Secured With: Paper T ape, 2x10 (in/yd) (Generic) 1 x Per Day/30 Days Discharge Instructions: Secure dressing with tape as directed. WOUND #7: - Lower Leg Wound Laterality: Left, Anterior Cleanser: Soap and Water 1 x Per Day/30 Days Discharge Instructions: May shower and wash wound with dial antibacterial soap and water prior to dressing change. Prim Dressing: Promogran Prisma Matrix, 4.34 (sq in) (silver collagen) 1 x Per Day/30 Days ary Discharge Instructions: Moisten collagen with saline or hydrogel Secondary Dressing: Woven Gauze Sponge, Non-Sterile 4x4 in (Generic) 1 x Per Day/30 Days Discharge Instructions: Apply over primary dressing as directed. Secured With: [employer], 4.5x3.1 (in/yd) (Generic) 1 x Per Day/30 Days Discharge Instructions: Secure with Kerlix as directed. Secured With: Paper T ape, 2x10 (in/yd) (Generic) 1 x Per Day/30 Days Discharge Instructions: Secure dressing with tape as directed. 1. Santyl 2. Collagen 3. Follow-up in 2 weeks Electronic Signature(s) Signed: 06/29/2021 12:04:27 PM By: Geralyn Corwin DO Entered By: Geralyn Corwin on 06/29/2021 12:03:10 -------------------------------------------------------------------------------- HxROS Details Patient Name: Date of Service: Kelly [name]. 06/29/2021 9:30 A [name] Medical Record Number: [id number] Patient Account Number: [id number] Date of Birth/Sex: Treating RN: 11-05-1995 (25 y.o. [name], [name] Primary Care Provider: Dione Booze Other Clinician: Referring Provider: Treating Provider/Extender: Gennaro Africa in  Treatment: 19 Information Obtained From Patient Respiratory Medical History: Positive for: Asthma Immunological Medical History: Positive for: Lupus Erythematosus Immunizations Pneumococcal Vaccine: Received Pneumococcal Vaccination: No Implantable Devices None Family and Social History Cancer: No; Diabetes: Yes - Paternal Grandparents; Heart Disease: No; Hereditary Spherocytosis: No; Hypertension: Yes - Paternal Grandparents; Kidney Disease: No; Lung Disease: No; Seizures: No; Stroke: No; Thyroid Problems: Yes - Maternal Grandparents,Paternal Grandparents; Tuberculosis: No; Never smoker; Marital Status - Married; Alcohol Use: Moderate; Drug Use: Current History - Marijuana; Caffeine Use: Daily - Coffee; Financial Concerns: No; Food, Clothing or Shelter Needs: No; Support System Lacking: No Electronic Signature(s) Signed: 06/29/2021 12:04:27 PM By: Geralyn Corwin DO Signed: 07/04/2021 4:45:49 PM By: Fonnie Mu RN Entered By: Geralyn Corwin on 06/29/2021 11:50:12 -------------------------------------------------------------------------------- SuperBill Details Patient Name: Date of Service: Kelly [name]. 06/29/2021 Medical Record Number: [id number] Patient Account Number: [id number] Date of Birth/Sex: Treating RN: 17-Oct-1995 (25 y.o. [name] Primary Care Provider: Dione Booze Other Clinician: Referring Provider: Treating Provider/Extender: Gennaro Africa in Treatment: 19 Diagnosis Coding ICD-10 Codes Code Description [phone] Unspecified open wound, left lower leg, subsequent encounter S91.002D Unspecified open wound, left ankle, subsequent encounter M32.9 Systemic lupus erythematosus, unspecified Facility Procedures CPT4 Code: [id number] Description: 11042 - DEB SUBQ TISSUE 20 SQ CM/< ICD-10 Diagnosis Description S91.002D Unspecified open wound, left ankle, subsequent encounter S81.802D Unspecified open wound, left  lower leg, subsequent encounter Modifier: Quantity: 1 CPT4 Code: [id number] Description: 11045 - DEB SUBQ  TISS EA ADDL 20CM ICD-10 Diagnosis Description S91.002D Unspecified open wound, left ankle, subsequent encounter S81.802D Unspecified open wound, left lower leg, subsequent encounter Modifier: Quantity: 1 CPT4 Code: [id number] Description: 97597 - DEBRIDE WOUND 1ST 20 SQ CM OR < ICD-10 Diagnosis Description S81.802D Unspecified open wound, left lower leg, subsequent encounter S91.002D Unspecified open wound, left ankle, subsequent encounter Modifier: Quantity: 1 Physician Procedures : CPT4 Code Description Modifier [id number] 11042 - WC PHYS SUBQ TISS 20 SQ CM ICD-10 Diagnosis Description S91.002D Unspecified open wound, left ankle, subsequent encounter S81.802D Unspecified open wound, left lower leg, subsequent encounter Quantity: 1 : [id number] 11045 - WC PHYS SUBQ TISS EA ADDL 20 CM ICD-10 Diagnosis Description S91.002D Unspecified open wound, left ankle, subsequent encounter S81.802D Unspecified open wound, left lower leg, subsequent encounter Quantity: 1 : [id number] 97597 - WC PHYS DEBR WO ANESTH 20 SQ CM ICD-10 Diagnosis Description S81.802D Unspecified open wound, left lower leg, subsequent encounter S91.002D Unspecified open wound, left ankle, subsequent encounter Quantity: 1 Electronic Signature(s) Signed: 06/29/2021 12:04:27 PM By: Geralyn Corwin DO Entered By: Geralyn Corwin on 06/29/2021 12:03:21

## 2021-07-06 DIAGNOSIS — S86011D Strain of right Achilles tendon, subsequent encounter: Secondary | ICD-10-CM | POA: Diagnosis not present

## 2021-07-10 DIAGNOSIS — S81802D Unspecified open wound, left lower leg, subsequent encounter: Secondary | ICD-10-CM | POA: Diagnosis not present

## 2021-07-10 DIAGNOSIS — S81002D Unspecified open wound, left knee, subsequent encounter: Secondary | ICD-10-CM | POA: Diagnosis not present

## 2021-07-10 DIAGNOSIS — M329 Systemic lupus erythematosus, unspecified: Secondary | ICD-10-CM | POA: Diagnosis not present

## 2021-07-11 DIAGNOSIS — Z23 Encounter for immunization: Secondary | ICD-10-CM | POA: Diagnosis not present

## 2021-07-12 ENCOUNTER — Ambulatory Visit (INDEPENDENT_AMBULATORY_CARE_PROVIDER_SITE_OTHER): Payer: BC Managed Care – PPO | Admitting: *Deleted

## 2021-07-12 ENCOUNTER — Other Ambulatory Visit: Payer: Self-pay

## 2021-07-12 VITALS — Wt 142.0 lb

## 2021-07-12 DIAGNOSIS — Z3042 Encounter for surveillance of injectable contraceptive: Secondary | ICD-10-CM | POA: Diagnosis not present

## 2021-07-12 DIAGNOSIS — S86011D Strain of right Achilles tendon, subsequent encounter: Secondary | ICD-10-CM | POA: Diagnosis not present

## 2021-07-12 NOTE — Progress Notes (Signed)
Agree with nurses's documentation of this patient's clinic encounter.  Johndaniel Catlin L, MD  

## 2021-07-12 NOTE — Progress Notes (Signed)
Date last pap: 04/16/2019. Last Depo-Provera: 04/13/2021. Side Effects if any: none.  Depo-Provera 150 mg IM given by: S. Malen Gauze, CMA. Next appointment due 2/1-2/15/2023. Pt made made aware she needs AEX prior to next Depo.  Administrations This Visit     medroxyPROGESTERone (DEPO-PROVERA) injection 150 mg     Admin Date 07/12/2021 Action Given Dose 150 mg Route Intramuscular Administered By Lanney Gins, CMA

## 2021-07-13 ENCOUNTER — Encounter (HOSPITAL_BASED_OUTPATIENT_CLINIC_OR_DEPARTMENT_OTHER): Payer: BC Managed Care – PPO | Admitting: Internal Medicine

## 2021-07-13 DIAGNOSIS — L97822 Non-pressure chronic ulcer of other part of left lower leg with fat layer exposed: Secondary | ICD-10-CM

## 2021-07-13 DIAGNOSIS — M329 Systemic lupus erythematosus, unspecified: Secondary | ICD-10-CM | POA: Diagnosis not present

## 2021-07-13 DIAGNOSIS — L97812 Non-pressure chronic ulcer of other part of right lower leg with fat layer exposed: Secondary | ICD-10-CM | POA: Diagnosis not present

## 2021-07-13 NOTE — Progress Notes (Signed)
LYA, HOLBEN ([id number]) Visit Report for 07/13/2021 Arrival Information Details Patient Name: Date of Service: BLIMI, GODBY. 07/13/2021 3:30 PM Medical Record Number: [id number] Patient Account Number: [id number] Date of Birth/Sex: Treating RN: 07-14-1996 (25 y.o. [name] Primary Care Kaylob Wallen: [name] Other Clinician: Referring Maris Abascal: Treating Skylarr Liz/Extender: Elsworth Soho in Treatment: 21 Visit Information History Since Last Visit Added or deleted any medications: No Patient Arrived: Ambulatory Any new allergies or adverse reactions: No Arrival Time: 15:38 Had a fall or experienced change in No Accompanied By: spouse activities of daily living that may affect Transfer Assistance: None risk of falls: Patient Identification Verified: Yes Signs or symptoms of abuse/neglect since last visito No Secondary Verification Process Completed: Yes Hospitalized since last visit: No Patient Requires Transmission-Based Precautions: No Implantable device outside of the clinic excluding No Patient Has Alerts: Yes cellular tissue based products placed in the center Patient Alerts: Left ABi=1.18 since last visit: Has Dressing in Place as Prescribed: Yes Pain Present Now: No Electronic Signature(s) Signed: 07/13/2021 5:23:58 PM By: Levan Hurst RN, BSN Entered By: Levan Hurst on 07/13/2021 15:38:55 -------------------------------------------------------------------------------- Encounter Discharge Information Details Patient Name: Date of Service: [name] 07/13/2021 3:30 PM Medical Record Number: [id number] Patient Account Number: [id number] Date of Birth/Sex: Treating RN: [birth year] (25 y.o. [name] Primary Care Deondrick Searls: [name] Other Clinician: Referring Anae Hams: Treating Kimmie Berggren/Extender: Elsworth Soho in Treatment: 21 Encounter Discharge Information  Items Post Procedure Vitals Discharge Condition: Stable Temperature (F): 98.7 Ambulatory Status: Ambulatory Pulse (bpm): 80 Discharge Destination: Home Respiratory Rate (breaths/min): 16 Transportation: Private Auto Blood Pressure (mmHg): 130/89 Accompanied By: spouse Schedule Follow-up Appointment: Yes Clinical Summary of Care: Patient Declined Electronic Signature(s) Signed: 07/13/2021 5:23:58 PM By: Levan Hurst RN, BSN Entered By: Levan Hurst on 07/13/2021 17:23:11 -------------------------------------------------------------------------------- Lower Extremity Assessment Details Patient Name: Date of Service: [name] 07/13/2021 3:30 PM Medical Record Number: [id number] Patient Account Number: [id number] Date of Birth/Sex: Treating RN: Oct 03, 1995 (25 y.o. [name] Primary Care Ericah Scotto: [name] Other Clinician: Referring Samani Deal: Treating Kimie Pidcock/Extender: Elsworth Soho in Treatment: 21 Edema Assessment Assessed: Shirlyn Goltz: No] Patrice Paradise: No] Edema: [Left: Ye] [Right: s] Calf Left: Right: Point of Measurement: 27 cm From Medial Instep 34 cm Ankle Left: Right: Point of Measurement: 8 cm From Medial Instep 22 cm Vascular Assessment Pulses: Dorsalis Pedis Palpable: [Left:Yes] Electronic Signature(s) Signed: 07/13/2021 5:23:58 PM By: Levan Hurst RN, BSN Entered By: Levan Hurst on 07/13/2021 15:47:49 -------------------------------------------------------------------------------- Multi Wound Chart Details Patient Name: Date of Service: [name] 07/13/2021 3:30 PM Medical Record Number: [id number] Patient Account Number: [id number] Date of Birth/Sex: Treating RN: Jan 25, 1996 (25 y.o. [name], [name] Primary Care Shawntel Farnworth: [name] Other Clinician: Referring Zadyn Yardley: Treating Parrish Daddario/Extender: Elsworth Soho in Treatment: 21 Vital  Signs Height(in): 62 Pulse(bpm): 80 Weight(lbs): Blood Pressure(mmHg): 130/89 Body Mass Index(BMI): Temperature(F): 98.7 Respiratory Rate(breaths/min): 16 Photos: [3:Left, Posterior Lower Leg] [4:Left, Lateral Ankle] [5:Left, Anterior Ankle] Wound Location: [3:Gradually Appeared] [4:Gradually Appeared] [5:Gradually Appeared] Wounding Event: [3:Cellulitis] [4:Cellulitis] [5:Cellulitis] Primary Etiology: [3:N/A] [4:Lupus] [5:Lupus] Secondary Etiology: [3:Asthma, Lupus Erythematosus] [4:Asthma, Lupus Erythematosus] [5:Asthma, Lupus Erythematosus] Comorbid History: [3:04/14/2021] [4:04/14/2021] [5:04/14/2021] Date Acquired: [3:11] [4:11] [5:11] Weeks of Treatment: [3:Open] [4:Open] [5:Open] Wound Status: [3:0x0x0] [4:3.3x4.5x0.1] [5:2x1.2x0.3] Measurements L x W x D (cm) [3:0] [4:11.663] [5:1.885] A (cm) : rea [3:0] [4:1.166] [5:0.565] Volume (cm) : [3:100.00%] [4:25.80%] [5:-100.10%] % Reduction in A rea: [3:100.00%] [  4:81.40%] [5:-200.50%] % Reduction in Volume: [5:7] Starting Position 1 (o'clock): [5:12] Ending Position 1 (o'clock): [5:0.3] Maximum Distance 1 (cm): [3:No] [4:No] [5:Yes] Undermining: [3:Full Thickness With Exposed Support Full Thickness Without Exposed] [5:Full Thickness Without Exposed] Classification: [3:Structures None Present] [4:Support Structures Medium] [5:Support Structures Medium] Exudate A mount: [3:N/A] [4:Serosanguineous] [5:Serosanguineous] Exudate Type: [3:N/A] [4:red, brown] [5:red, brown] Exudate Color: [3:Distinct, outline attached] [4:Distinct, outline attached] [5:Distinct, outline attached] Wound Margin: [3:None Present (0%)] [4:Large (67-100%)] [5:Medium (34-66%)] Granulation A mount: [3:N/A] [4:Pink] [5:Red, Pink] Granulation Quality: [3:None Present (0%)] [4:Small (1-33%)] [5:Medium (34-66%)] Necrotic A mount: [3:Fascia: No] [4:Fat Layer (Subcutaneous Tissue): Yes Fat Layer (Subcutaneous Tissue): Yes] Exposed Structures: [3:Fat Layer  (Subcutaneous Tissue): No Fascia: No Tendon: No Muscle: No Joint: No Bone: No Large (67-100%)] [4:Tendon: No Muscle: No Joint: No Bone: No None] [5:Fascia: No Tendon: No Muscle: No Joint: No Bone: No None] Epithelialization: [3:N/A] [4:Debridement - Excisional] [5:Debridement - Excisional] Debridement: Pre-procedure Verification/Time Out N/A [4:16:10] [5:16:10] Taken: [3:N/A] [4:Subcutaneous, Slough] [5:Subcutaneous, Slough] Tissue Debrided: [3:N/A] [4:Skin/Subcutaneous Tissue] [5:Skin/Subcutaneous Tissue] Level: [3:N/A] [4:14.85] [5:2.4] Debridement A (sq cm): [3:rea N/A] [4:Curette] [5:Curette] Instrument: [3:N/A] [4:Minimum] [5:Minimum] Bleeding: [3:N/A] [4:Pressure] [5:Pressure] Hemostasis A chieved: [3:N/A] [4:0] [5:0] Procedural Pain: [3:N/A] [4:0] [5:0] Post Procedural Pain: [3:N/A] [4:Procedure was tolerated well] [5:Procedure was tolerated well] Debridement Treatment Response: [3:N/A] [4:3.3x4.5x0.1] [5:2x1.2x0.3] Post Debridement Measurements L x W x D (cm) [3:N/A] [4:1.166] [5:0.565] Post Debridement Volume: (cm) [3:N/A] [4:Debridement] [5:Debridement] Wound Number: 6 7 N/A Photos: N/A Left, Distal, Posterior Lower Leg Left, Anterior Lower Leg N/A Wound Location: Gradually Appeared Other Lesion N/A Wounding Event: Lupus Lupus N/A Primary Etiology: N/A N/A N/A Secondary Etiology: Asthma, Lupus Erythematosus Asthma, Lupus Erythematosus N/A Comorbid History: 04/14/2021 05/18/2021 N/A Date Acquired: 11 8 N/A Weeks of Treatment: Open Open N/A Wound Status: 0x0x0 1.4x1x0.3 N/A Measurements L x W x D (cm) 0 1.1 N/A A (cm) : rea 0 0.33 N/A Volume (cm) : 100.00% 10.20% N/A % Reduction in A rea: 100.00% 10.30% N/A % Reduction in Volume: 12 Starting Position 1 (o'clock): 12 Ending Position 1 (o'clock): 0.8 Maximum Distance 1 (cm): No Yes N/A Undermining: Full Thickness Without Exposed Full Thickness Without Exposed N/A Classification: Support Structures  Support Structures None Present Medium N/A Exudate Amount: N/A Serosanguineous N/A Exudate Type: N/A red, brown N/A Exudate Color: Distinct, outline attached Well defined, not attached N/A Wound Margin: None Present (0%) Medium (34-66%) N/A Granulation Amount: N/A Red N/A Granulation Quality: None Present (0%) Medium (34-66%) N/A Necrotic Amount: Fascia: No Fat Layer (Subcutaneous Tissue): Yes N/A Exposed Structures: Fat Layer (Subcutaneous Tissue): No Fascia: No Tendon: No Tendon: No Muscle: No Muscle: No Joint: No Joint: No Bone: No Bone: No Large (67-100%) None N/A Epithelialization: N/A N/A N/A Debridement: N/A N/A N/A Tissue Debrided: N/A N/A N/A Level: N/A N/A N/A Debridement A (sq cm): rea N/A N/A N/A Instrument: N/A N/A N/A Bleeding: N/A N/A N/A Hemostasis A chieved: N/A N/A N/A Procedural Pain: N/A N/A N/A Post Procedural Pain: Debridement Treatment Response: N/A N/A N/A Post Debridement Measurements L x N/A N/A N/A W x D (cm) N/A N/A N/A Post Debridement Volume: (cm) N/A N/A N/A Procedures Performed: Treatment Notes Electronic Signature(s) Signed: 07/13/2021 4:35:00 PM By: Kalman Shan DO Signed: 07/13/2021 5:03:29 PM By: Rhae Hammock RN Entered By: Kalman Shan on 07/13/2021 16:24:22 -------------------------------------------------------------------------------- Multi-Disciplinary Care Plan Details Patient Name: Date of Service: [name] 07/13/2021 3:30 PM Medical Record Number: [id number] Patient Account Number: [id number] Date of Birth/Sex: Treating  RN: 04/24/1996 (25 y.o. [name] Primary Care Quincey Nored: [name] Other Clinician: Referring Naelle Diegel: Treating Jasara Corrigan/Extender: Elsworth Soho in Treatment: 21 Multidisciplinary Care Plan reviewed with physician Active Inactive Abuse / Safety / Falls / Self Care Management Nursing Diagnoses: History of  Falls Potential for falls Goals: Patient/caregiver will verbalize/demonstrate measures taken to prevent injury and/or falls Date Initiated: 04/21/2021 Target Resolution Date: 07/27/2021 Goal Status: Active Interventions: Assess fall risk on admission and as needed Assess impairment of mobility on admission and as needed per policy Notes: Wound/Skin Impairment Nursing Diagnoses: Impaired tissue integrity Goals: Patient/caregiver will verbalize understanding of skin care regimen Date Initiated: 02/13/2021 Target Resolution Date: 07/27/2021 Goal Status: Active Ulcer/skin breakdown will have a volume reduction of 30% by week 4 Date Initiated: 02/13/2021 Date Inactivated: 04/21/2021 Target Resolution Date: 03/13/2021 Unmet Reason: pt did not return to Goal Status: Unmet clinic for F/U Ulcer/skin breakdown will have a volume reduction of 80% by week 12 Date Initiated: 04/21/2021 Date Inactivated: 07/13/2021 Target Resolution Date: 07/27/2021 Goal Status: Met Interventions: Assess patient/caregiver ability to obtain necessary supplies Assess patient/caregiver ability to perform ulcer/skin care regimen upon admission and as needed Assess ulceration(s) every visit Provide education on ulcer and skin care Treatment Activities: Skin care regimen initiated : 02/13/2021 Topical wound management initiated : 02/13/2021 Notes: 06/29/21: Wounds 3 and 6 only at 80% volume reduction. Target date extended. Electronic Signature(s) Signed: 07/13/2021 5:23:58 PM By: Levan Hurst RN, BSN Entered By: Levan Hurst on 07/13/2021 16:14:25 -------------------------------------------------------------------------------- Pain Assessment Details Patient Name: Date of Service: Earlie Lou RIA Cipriano Bunker. 07/13/2021 3:30 PM Medical Record Number: [id number] Patient Account Number: [id number] Date of Birth/Sex: Treating RN: [birth year] (25 y.o. [name] Primary Care Don Giarrusso: [name] Other  Clinician: Referring Valmai Vandenberghe: Treating Dareon Nunziato/Extender: Elsworth Soho in Treatment: 21 Active Problems Location of Pain Severity and Description of Pain Patient Has Paino No Site Locations Pain Management and Medication Current Pain Management: Electronic Signature(s) Signed: 07/13/2021 5:23:58 PM By: Levan Hurst RN, BSN Entered By: Levan Hurst on 07/13/2021 15:40:40 -------------------------------------------------------------------------------- Patient/Caregiver Education Details Patient Name: Date of Service: Fransico Setters 11/17/2022andnbsp3:30 PM Medical Record Number: [id number] Patient Account Number: [id number] Date of Birth/Gender: Treating RN: 06-04-1996 (25 y.o. [name] Primary Care Physician: [name] Other Clinician: Referring Physician: Treating Physician/Extender: Elsworth Soho in Treatment: 21 Education Assessment Education Provided To: Patient Education Topics Provided Wound/Skin Impairment: Methods: Explain/Verbal Responses: State content correctly [employer]) Signed: 07/13/2021 5:23:58 PM By: Levan Hurst RN, BSN Entered By: Levan Hurst on 07/13/2021 16:24:52 -------------------------------------------------------------------------------- Wound Assessment Details Patient Name: Date of Service: Earlie Lou RIA Cipriano Bunker. 07/13/2021 3:30 PM Medical Record Number: [id number] Patient Account Number: [id number] Date of Birth/Sex: Treating RN: [birth year] (25 y.o. [name] Primary Care Clennon Nasca: [name] Other Clinician: Referring Modesto Ganoe: Treating Aland Chestnutt/Extender: Elsworth Soho in Treatment: 21 Wound Status Wound Number: 3 Primary Etiology: Cellulitis Wound Location: Left, Posterior Lower Leg Wound Status: Open Wounding Event: Gradually Appeared Comorbid History: Asthma, Lupus Erythematosus Date  Acquired: 04/14/2021 Weeks Of Treatment: 11 Clustered Wound: No Photos Wound Measurements Length: (cm) Width: (cm) Depth: (cm) Area: (cm) Volume: (cm) 0 % Reduction in Area: 100% 0 % Reduction in Volume: 100% 0 Epithelialization: Large (67-100%) 0 Tunneling: No 0 Undermining: No Wound Description Classification: Full Thickness With Exposed Support Structures Wound Margin: Distinct, outline attached Exudate Amount: None Present Foul Odor After Cleansing: No Slough/Fibrino No Wound Bed Granulation Amount:  None Present (0%) Exposed Structure Necrotic Amount: None Present (0%) Fascia Exposed: No Fat Layer (Subcutaneous Tissue) Exposed: No Tendon Exposed: No Muscle Exposed: No Joint Exposed: No Bone Exposed: No Electronic Signature(s) Signed: 07/13/2021 5:03:41 PM By: Lorrin Jackson Signed: 07/13/2021 5:23:58 PM By: Levan Hurst RN, BSN Entered By: Lorrin Jackson on 07/13/2021 15:51:55 -------------------------------------------------------------------------------- Wound Assessment Details Patient Name: Date of Service: [name] 07/13/2021 3:30 PM Medical Record Number: [id number] Patient Account Number: [id number] Date of Birth/Sex: Treating RN: Dec 28, 1995 (25 y.o. [name] Primary Care Mercedes Fort: [name] Other Clinician: Referring Naven Giambalvo: Treating Markeisha Mancias/Extender: Elsworth Soho in Treatment: 21 Wound Status Wound Number: 4 Primary Etiology: Cellulitis Wound Location: Left, Lateral Ankle Secondary Etiology: Lupus Wounding Event: Gradually Appeared Wound Status: Open Date Acquired: 04/14/2021 Comorbid History: Asthma, Lupus Erythematosus Weeks Of Treatment: 11 Clustered Wound: No Photos Wound Measurements Length: (cm) 3.3 Width: (cm) 4.5 Depth: (cm) 0.1 Area: (cm) 11.663 Volume: (cm) 1.166 % Reduction in Area: 25.8% % Reduction in Volume: 81.4% Epithelialization: None Tunneling:  No Undermining: No Wound Description Classification: Full Thickness Without Exposed Support Structures Wound Margin: Distinct, outline attached Exudate Amount: Medium Exudate Type: Serosanguineous Exudate Color: red, brown Foul Odor After Cleansing: No Slough/Fibrino Yes Wound Bed Granulation Amount: Large (67-100%) Exposed Structure Granulation Quality: Pink Fascia Exposed: No Necrotic Amount: Small (1-33%) Fat Layer (Subcutaneous Tissue) Exposed: Yes Necrotic Quality: Adherent Slough Tendon Exposed: No Muscle Exposed: No Joint Exposed: No Bone Exposed: No Treatment Notes Wound #4 (Ankle) Wound Laterality: Left, Lateral Cleanser Soap and Water Discharge Instruction: May shower and wash wound with dial antibacterial soap and water prior to dressing change. Peri-Wound Care Topical Primary Dressing Santyl Ointment Discharge Instruction: Apply nickel thick amount to wound bed as instructed Secondary Dressing Woven Gauze Sponge, Non-Sterile 4x4 in Discharge Instruction: Apply over primary dressing as directed. Secured With [employer], 4.5x3.1 (in/yd) Discharge Instruction: Secure with Kerlix as directed. Paper Tape, 2x10 (in/yd) Discharge Instruction: Secure dressing with tape as directed. Compression Wrap Compression Stockings Add-Ons Electronic Signature(s) Signed: 07/13/2021 5:03:41 PM By: Lorrin Jackson Signed: 07/13/2021 5:23:58 PM By: Levan Hurst RN, BSN Entered By: Lorrin Jackson on 07/13/2021 15:52:20 -------------------------------------------------------------------------------- Wound Assessment Details Patient Name: Date of Service: [name] 07/13/2021 3:30 PM Medical Record Number: [id number] Patient Account Number: [id number] Date of Birth/Sex: Treating RN: [birth year] (25 y.o. [name] Primary Care Shomari Scicchitano: [name] Other Clinician: Referring Gwen Edler: Treating Koehn Salehi/Extender: Elsworth Soho in Treatment: 21 Wound Status Wound Number: 5 Primary Etiology: Cellulitis Wound Location: Left, Anterior Ankle Secondary Etiology: Lupus Wounding Event: Gradually Appeared Wound Status: Open Date Acquired: 04/14/2021 Comorbid History: Asthma, Lupus Erythematosus Weeks Of Treatment: 11 Clustered Wound: No Photos Wound Measurements Length: (cm) 2 Width: (cm) 1.2 Depth: (cm) 0.3 Area: (cm) 1.885 Volume: (cm) 0.565 % Reduction in Area: -100.1% % Reduction in Volume: -200.5% Epithelialization: None Tunneling: No Undermining: Yes Starting Position (o'clock): 7 Ending Position (o'clock): 12 Maximum Distance: (cm) 0.3 Wound Description Classification: Full Thickness Without Exposed Support Structures Wound Margin: Distinct, outline attached Exudate Amount: Medium Exudate Type: Serosanguineous Exudate Color: red, brown Foul Odor After Cleansing: No Slough/Fibrino Yes Wound Bed Granulation Amount: Medium (34-66%) Exposed Structure Granulation Quality: Red, Pink Fascia Exposed: No Necrotic Amount: Medium (34-66%) Fat Layer (Subcutaneous Tissue) Exposed: Yes Necrotic Quality: Adherent Slough Tendon Exposed: No Muscle Exposed: No Joint Exposed: No Bone Exposed: No Treatment Notes Wound #5 (Ankle) Wound Laterality: Left, Anterior Cleanser Soap and Water  Discharge Instruction: May shower and wash wound with dial antibacterial soap and water prior to dressing change. Peri-Wound Care Topical Primary Dressing Promogran Prisma Matrix, 4.34 (sq in) (silver collagen) Discharge Instruction: Moisten collagen with saline or hydrogel Secondary Dressing Woven Gauze Sponge, Non-Sterile 4x4 in Discharge Instruction: Apply over primary dressing as directed. Secured With [employer], 4.5x3.1 (in/yd) Discharge Instruction: Secure with Kerlix as directed. Paper Tape, 2x10 (in/yd) Discharge Instruction: Secure dressing with tape as directed. Compression  Wrap Compression Stockings Add-Ons Electronic Signature(s) Signed: 07/13/2021 5:03:41 PM By: Lorrin Jackson Signed: 07/13/2021 5:23:58 PM By: Levan Hurst RN, BSN Entered By: Lorrin Jackson on 07/13/2021 15:52:43 -------------------------------------------------------------------------------- Wound Assessment Details Patient Name: Date of Service: [name] 07/13/2021 3:30 PM Medical Record Number: [id number] Patient Account Number: [id number] Date of Birth/Sex: Treating RN: 07-23-1996 (25 y.o. [name] Primary Care Chinwe Lope: [name] Other Clinician: Referring Clem Wisenbaker: Treating Cyara Devoto/Extender: Elsworth Soho in Treatment: 21 Wound Status Wound Number: 6 Primary Etiology: Lupus Wound Location: Left, Distal, Posterior Lower Leg Wound Status: Open Wounding Event: Gradually Appeared Comorbid History: Asthma, Lupus Erythematosus Date Acquired: 04/14/2021 Weeks Of Treatment: 11 Clustered Wound: No Photos Wound Measurements Length: (cm) Width: (cm) Depth: (cm) Area: (cm) Volume: (cm) 0 % Reduction in Area: 100% 0 % Reduction in Volume: 100% 0 Epithelialization: Large (67-100%) 0 Tunneling: No 0 Undermining: No Wound Description Classification: Full Thickness Without Exposed Support Structures Wound Margin: Distinct, outline attached Exudate Amount: None Present Foul Odor After Cleansing: No Slough/Fibrino No Wound Bed Granulation Amount: None Present (0%) Exposed Structure Necrotic Amount: None Present (0%) Fascia Exposed: No Fat Layer (Subcutaneous Tissue) Exposed: No Tendon Exposed: No Muscle Exposed: No Joint Exposed: No Bone Exposed: No Electronic Signature(s) Signed: 07/13/2021 5:03:41 PM By: Lorrin Jackson Signed: 07/13/2021 5:23:58 PM By: Levan Hurst RN, BSN Entered By: Lorrin Jackson on 07/13/2021  15:53:09 -------------------------------------------------------------------------------- Wound Assessment Details Patient Name: Date of Service: [name] 07/13/2021 3:30 PM Medical Record Number: [id number] Patient Account Number: [id number] Date of Birth/Sex: Treating RN: September 30, 1995 (25 y.o. [name] Primary Care Kaston Faughn: [name] Other Clinician: Referring Egon Dittus: Treating Juliene Kirsh/Extender: Elsworth Soho in Treatment: 21 Wound Status Wound Number: 7 Primary Etiology: Lupus Wound Location: Left, Anterior Lower Leg Wound Status: Open Wounding Event: Other Lesion Comorbid History: Asthma, Lupus Erythematosus Date Acquired: 05/18/2021 Weeks Of Treatment: 8 Clustered Wound: No Photos Wound Measurements Length: (cm) 1.4 Width: (cm) 1 Depth: (cm) 0.3 Area: (cm) 1.1 Volume: (cm) 0.33 % Reduction in Area: 10.2% % Reduction in Volume: 10.3% Epithelialization: None Tunneling: No Undermining: Yes Starting Position (o'clock): 12 Ending Position (o'clock): 12 Maximum Distance: (cm) 0.8 Wound Description Classification: Full Thickness Without Exposed Support Structures Wound Margin: Well defined, not attached Exudate Amount: Medium Exudate Type: Serosanguineous Exudate Color: red, brown Foul Odor After Cleansing: No Slough/Fibrino Yes Wound Bed Granulation Amount: Medium (34-66%) Exposed Structure Granulation Quality: Red Fascia Exposed: No Necrotic Amount: Medium (34-66%) Fat Layer (Subcutaneous Tissue) Exposed: Yes Necrotic Quality: Adherent Slough Tendon Exposed: No Muscle Exposed: No Joint Exposed: No Bone Exposed: No Treatment Notes Wound #7 (Lower Leg) Wound Laterality: Left, Anterior Cleanser Soap and Water Discharge Instruction: May shower and wash wound with dial antibacterial soap and water prior to dressing change. Peri-Wound Care Topical Primary Dressing Promogran Prisma Matrix, 4.34 (sq  in) (silver collagen) Discharge Instruction: Moisten collagen with saline or hydrogel Secondary Dressing Woven Gauze Sponge, Non-Sterile 4x4 in Discharge Instruction: Apply over primary dressing  as directed. Secured With [employer], 4.5x3.1 (in/yd) Discharge Instruction: Secure with Kerlix as directed. Paper Tape, 2x10 (in/yd) Discharge Instruction: Secure dressing with tape as directed. Compression Wrap Compression Stockings Add-Ons Electronic Signature(s) Signed: 07/13/2021 5:03:41 PM By: Lorrin Jackson Signed: 07/13/2021 5:23:58 PM By: Levan Hurst RN, BSN Entered By: Lorrin Jackson on 07/13/2021 15:53:42 -------------------------------------------------------------------------------- Vitals Details Patient Name: Date of Service: [name] 07/13/2021 3:30 PM Medical Record Number: [id number] Patient Account Number: [id number] Date of Birth/Sex: Treating RN: [birth year] (25 y.o. Benjamine Sprague, Briant Cedar Primary Care Denissa Cozart: [name] Other Clinician: Referring Arius Harnois: Treating Nomie Buchberger/Extender: Elsworth Soho in Treatment: 21 Vital Signs Time Taken: 15:40 Temperature (F): 98.7 Height (in): 62 Pulse (bpm): 80 Respiratory Rate (breaths/min): 16 Blood Pressure (mmHg): 130/89 Reference Range: 80 - 120 mg / dl Electronic Signature(s) Signed: 07/13/2021 5:23:58 PM By: Levan Hurst RN, BSN Entered By: Levan Hurst on 07/13/2021 15:40:35

## 2021-07-13 NOTE — Progress Notes (Signed)
AMAMDA, CURBOW ([id number]) Visit Report for 07/13/2021 Chief Complaint Document Details Patient Name: Date of Service: FAIGY, STRETCH. 07/13/2021 3:30 PM Medical Record Number: [id number] Patient Account Number: [id number] Date of Birth/Sex: Treating RN: 07/06/1996 (25 y.o. [name], [name] Primary Care Provider: Dione Booze Other Clinician: Referring Provider: Treating Provider/Extender: Gennaro Africa in Treatment: 21 Information Obtained from: Patient Chief Complaint Bilateral lower extremity wounds 04/21/21: left lower extremity wound s/p cellulitis Electronic Signature(s) Signed: 07/13/2021 4:35:00 PM By: Geralyn Corwin DO Entered By: Geralyn Corwin on 07/13/2021 16:24:34 -------------------------------------------------------------------------------- Debridement Details Patient Name: Date of Service: [name] 07/13/2021 3:30 PM Medical Record Number: [id number] Patient Account Number: [id number] Date of Birth/Sex: Treating RN: 1996/03/05 (25 y.o. [name] Primary Care Provider: Dione Booze Other Clinician: Referring Provider: Treating Provider/Extender: Gennaro Africa in Treatment: 21 Debridement Performed for Assessment: Wound #4 Left,Lateral Ankle Performed By: Physician Geralyn Corwin, DO Debridement Type: Debridement Level of Consciousness (Pre-procedure): Awake and Alert Pre-procedure Verification/Time Out Yes - 16:10 Taken: Start Time: 16:10 T Area Debrided (L x W): otal 3.3 (cm) x 4.5 (cm) = 14.85 (cm) Tissue and other material debrided: Viable, Non-Viable, Slough, Subcutaneous, Slough Level: Skin/Subcutaneous Tissue Debridement Description: Excisional Instrument: Curette Bleeding: Minimum Hemostasis Achieved: Pressure End Time: 16:12 Procedural Pain: 0 Post Procedural Pain: 0 Response to Treatment: Procedure was tolerated well Level of Consciousness  (Post- Awake and Alert procedure): Post Debridement Measurements of Total Wound Length: (cm) 3.3 Width: (cm) 4.5 Depth: (cm) 0.1 Volume: (cm) 1.166 Character of Wound/Ulcer Post Debridement: Requires Further Debridement Post Procedure Diagnosis Same as Pre-procedure Electronic Signature(s) Signed: 07/13/2021 4:35:00 PM By: Geralyn Corwin DO Signed: 07/13/2021 5:23:58 PM By: Zandra Abts RN, BSN Entered By: Zandra Abts on 07/13/2021 16:11:57 -------------------------------------------------------------------------------- Debridement Details Patient Name: Date of Service: [name] 07/13/2021 3:30 PM Medical Record Number: [id number] Patient Account Number: [id number] Date of Birth/Sex: Treating RN: [birth year] (25 y.o. Dorthula Perfect, Emeterio Reeve Primary Care Provider: Dione Booze Other Clinician: Referring Provider: Treating Provider/Extender: Gennaro Africa in Treatment: 21 Debridement Performed for Assessment: Wound #5 Left,Anterior Ankle Performed By: Physician Geralyn Corwin, DO Debridement Type: Debridement Level of Consciousness (Pre-procedure): Awake and Alert Pre-procedure Verification/Time Out Yes - 16:10 Taken: Start Time: 16:10 T Area Debrided (L x W): otal 2 (cm) x 1.2 (cm) = 2.4 (cm) Tissue and other material debrided: Viable, Non-Viable, Slough, Subcutaneous, Slough Level: Skin/Subcutaneous Tissue Debridement Description: Excisional Instrument: Curette Bleeding: Minimum Hemostasis Achieved: Pressure End Time: 16:12 Procedural Pain: 0 Post Procedural Pain: 0 Response to Treatment: Procedure was tolerated well Level of Consciousness (Post- Awake and Alert procedure): Post Debridement Measurements of Total Wound Length: (cm) 2 Width: (cm) 1.2 Depth: (cm) 0.3 Volume: (cm) 0.565 Character of Wound/Ulcer Post Debridement: Improved Post Procedure Diagnosis Same as Pre-procedure Electronic Signature(s) Signed:  07/13/2021 4:35:00 PM By: Geralyn Corwin DO Signed: 07/13/2021 5:23:58 PM By: Zandra Abts RN, BSN Entered By: Zandra Abts on 07/13/2021 16:12:34 -------------------------------------------------------------------------------- HPI Details Patient Name: Date of Service: [name] 07/13/2021 3:30 PM Medical Record Number: [id number] Patient Account Number: [id number] Date of Birth/Sex: Treating RN: 02/22/1996 (25 y.o. [name], [name] Primary Care Provider: Dione Booze Other Clinician: Referring Provider: Treating Provider/Extender: Gennaro Africa in Treatment: 21 History of Present Illness HPI Description: Admission 6/21 Ms. Borghild Thaker is A 25 year old female with a past medical history of systemic lupus erythematosus that presents with bilateral lower extremity wounds  that started in April 2022. She has had skin issues in the past where she developed very small wounds but these healed with time. She has never had open wounds like she does on her legs. She currently reports minimal drainage to the wounds. She does have pain to these areas but has been overall stable. She denies signs of infection. She has visited the ED for this issue and was recently prescribed doxycycline for possible cellulitis. She follows with [facility] for her SLE and is currently on Plaquenil, prednisone and CellCept. 6/27; patient presents for 1 week follow-up. She states she saw her rheumatologist on 6/22 and prednisone was increased from 10 mg to 20 mg daily. She is currently at the highest dose of Plaquenil and CellCept and is going to try an infusion later today at her regimen. She reports improvement to the wounds. She has been using collagen with dressing changes. She currently denies signs of infection. Readmission 8/26 Patient presents to clinic today for open wounds to her left lower extremity. Her previous wounds that were treated have  closed. She states that on 8/9 she developed cellulitis and was treated with 2 rounds of antibiotics. Her cellulitis has resolved. She had blisters that opened and are now large wounds. She has seen her dermatologist and rheumatologist since she has systemic lupus and a hard time healing. No changes have been made to her medications. She was recommended antibiotic ointment by her dermatologist to her wounds She picked up the antibiotic ointment today.. She reports pain to these areas. She denies purulent drainage, increased warmth or erythema to the left lower extremity. She currently keeps the areas covered. Of note she had an Achilles tendon rupture on her right leg and is currently in a soft cast. 9/13; patient presents for follow-up. She missed her last clinic appointment. She has been using Santyl to the wound beds daily. She has no issues or complaints today. She reports the Santyl is helping clean up the wounds. She currently denies signs of infection. 9/22; patient states that 1 week ago she was seen by dermatology and they placed an Unna boot with Bactroban on her left lower extremity. Today she presents with a Unna boot in place. She currently denies signs of infection. 10/7; patient presents for follow-up. She reports improvement. She has been using Santyl to the areas of nonviable tissue and collagen to granulation tissue. She has been approved for infusions for her lupus. She is going to start these soon. These will be injection she can do at home. She currently denies signs of infection. 10/21; patient presents for follow-up. She reports continued improvement to her wound healing. She has been using Santyl to all areas except for the superior posterior left leg wound and she is using collagen to this. She has started her injection therapy for lupus. She started 1 week ago. She currently denies signs of infection. 11/3; patient presents for follow-up. She has no issues or complaints  today. She is on her third week of injection therapy for lupus and tolerating this well. She has no issues or complaints today. She denies signs of infection. 11/17; patient presents for 2-week follow-up. She reports improvement in wound healing. She denies signs of infection. Electronic Signature(s) Signed: 07/13/2021 4:35:00 PM By: Geralyn Corwin DO Entered By: Geralyn Corwin on 07/13/2021 16:25:55 -------------------------------------------------------------------------------- Physical Exam Details Patient Name: Date of Service: [name] 07/13/2021 3:30 PM Medical Record Number: [id number] Patient Account Number: [id number] Date of Birth/Sex: Treating  RN: 1996-03-11 (25 y.o. [name], [name] Primary Care Provider: Dione Booze Other Clinician: Referring Provider: Treating Provider/Extender: Gennaro Africa in Treatment: 21 Constitutional respirations regular, non-labored and within target range for patient.. Cardiovascular 2+ dorsalis pedis/posterior tibialis pulses. Psychiatric pleasant and cooperative. Notes Left lower extremity: 3 wounds present. 2 wounds to the posterior aspect have healed and epithelialized. The remaining wounds have granulation tissue present with scant nonviable tissue to the left lateral ankle and anterior lower leg. No surrounding signs of infection. Appears well-healing. Electronic Signature(s) Signed: 07/13/2021 4:35:00 PM By: Geralyn Corwin DO Signed: 07/13/2021 4:35:00 PM By: Geralyn Corwin DO Entered By: Geralyn Corwin on 07/13/2021 16:28:05 -------------------------------------------------------------------------------- Physician Orders Details Patient Name: Date of Service: [name] 07/13/2021 3:30 PM Medical Record Number: [id number] Patient Account Number: [id number] Date of Birth/Sex: Treating RN: [birth year] (25 y.o. Dorthula Perfect, Emeterio Reeve Primary Care Provider: Dione Booze Other Clinician: Referring Provider: Treating Provider/Extender: Gennaro Africa in Treatment: 21 Verbal / Phone Orders: No Diagnosis Coding ICD-10 Coding Code Description [phone] Non-pressure chronic ulcer of other part of left lower leg with fat layer exposed L97.812 Non-pressure chronic ulcer of other part of right lower leg with fat layer exposed M32.9 Systemic lupus erythematosus, unspecified Follow-up Appointments ppointment in 2 weeks. - Dr. Mikey Bussing Return A Bathing/ Shower/ Hygiene May shower and wash wound with soap and water. Edema Control - Lymphedema / SCD / Other Elevate legs to the level of the heart or above for 30 minutes daily and/or when sitting, a frequency of: - 3-4 times a day throughout the day. Avoid standing for long periods of time. Additional Orders / Instructions Follow Nutritious Diet Wound Treatment Wound #4 - Ankle Wound Laterality: Left, Lateral Cleanser: Soap and Water 1 x Per Day/30 Days Discharge Instructions: May shower and wash wound with dial antibacterial soap and water prior to dressing change. Prim Dressing: Santyl Ointment 1 x Per Day/30 Days ary Discharge Instructions: Apply nickel thick amount to wound bed as instructed Secondary Dressing: Woven Gauze Sponge, Non-Sterile 4x4 in (Generic) 1 x Per Day/30 Days Discharge Instructions: Apply over primary dressing as directed. Secured With: [employer], 4.5x3.1 (in/yd) (Generic) 1 x Per Day/30 Days Discharge Instructions: Secure with Kerlix as directed. Secured With: Paper Tape, 2x10 (in/yd) (Generic) 1 x Per Day/30 Days Discharge Instructions: Secure dressing with tape as directed. Wound #5 - Ankle Wound Laterality: Left, Anterior Cleanser: Soap and Water 1 x Per Day/30 Days Discharge Instructions: May shower and wash wound with dial antibacterial soap and water prior to dressing change. Prim Dressing: Promogran Prisma Matrix, 4.34 (sq in) (silver  collagen) 1 x Per Day/30 Days ary Discharge Instructions: Moisten collagen with saline or hydrogel Secondary Dressing: Woven Gauze Sponge, Non-Sterile 4x4 in (Generic) 1 x Per Day/30 Days Discharge Instructions: Apply over primary dressing as directed. Secured With: [employer], 4.5x3.1 (in/yd) (Generic) 1 x Per Day/30 Days Discharge Instructions: Secure with Kerlix as directed. Secured With: Paper Tape, 2x10 (in/yd) (Generic) 1 x Per Day/30 Days Discharge Instructions: Secure dressing with tape as directed. Wound #7 - Lower Leg Wound Laterality: Left, Anterior Cleanser: Soap and Water 1 x Per Day/30 Days Discharge Instructions: May shower and wash wound with dial antibacterial soap and water prior to dressing change. Prim Dressing: Promogran Prisma Matrix, 4.34 (sq in) (silver collagen) 1 x Per Day/30 Days ary Discharge Instructions: Moisten collagen with saline or hydrogel Secondary Dressing: Woven Gauze Sponge, Non-Sterile 4x4 in (Generic) 1 x  Per Day/30 Days Discharge Instructions: Apply over primary dressing as directed. Secured With: [employer], 4.5x3.1 (in/yd) (Generic) 1 x Per Day/30 Days Discharge Instructions: Secure with Kerlix as directed. Secured With: Paper Tape, 2x10 (in/yd) (Generic) 1 x Per Day/30 Days Discharge Instructions: Secure dressing with tape as directed. Electronic Signature(s) Signed: 07/13/2021 4:35:00 PM By: Geralyn Corwin DO Entered By: Geralyn Corwin on 07/13/2021 16:28:20 -------------------------------------------------------------------------------- Problem List Details Patient Name: Date of Service: [name] 07/13/2021 3:30 PM Medical Record Number: [id number] Patient Account Number: [id number] Date of Birth/Sex: Treating RN: 08-14-1996 (25 y.o. [name] Primary Care Provider: Dione Booze Other Clinician: Referring Provider: Treating Provider/Extender: Gennaro Africa in  Treatment: 21 Active Problems ICD-10 Encounter Code Description Active Date MDM Diagnosis [phone] Non-pressure chronic ulcer of other part of left lower leg with fat layer exposed11/10/2020 No Yes L97.812 Non-pressure chronic ulcer of other part of right lower leg with fat layer 06/29/2021 No Yes exposed M32.9 Systemic lupus erythematosus, unspecified 02/14/2021 No Yes Inactive Problems ICD-10 Code Description Active Date Inactive Date L97.819 Non-pressure chronic ulcer of other part of right lower leg with unspecified severity 02/13/2021 02/13/2021 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 with unspecified severity 02/13/2021 02/13/2021 B14.782N Unspecified open wound, left lower leg, initial encounter 04/21/2021 04/21/2021 S91.002A Unspecified open wound, left ankle, initial encounter 04/21/2021 04/21/2021 Resolved Problems Electronic Signature(s) Signed: 07/13/2021 4:35:00 PM By: Geralyn Corwin DO Entered By: Geralyn Corwin on 07/13/2021 16:24:15 -------------------------------------------------------------------------------- Progress Note Details Patient Name: Date of Service: [name] 07/13/2021 3:30 PM Medical Record Number: [id number] Patient Account Number: [id number] Date of Birth/Sex: Treating RN: 1996/05/31 (25 y.o. [name], [name] Primary Care Provider: Dione Booze Other Clinician: Referring Provider: Treating Provider/Extender: Gennaro Africa in Treatment: 21 Subjective Chief Complaint Information obtained from Patient Bilateral lower extremity wounds 04/21/21: left lower extremity wound s/p cellulitis History of Present Illness (HPI) Admission 6/21 Ms. Orvella Digiulio is A 25 year old female with a past medical history of systemic lupus erythematosus that presents with bilateral lower extremity wounds that started in April 2022. She has had skin issues in the past where she developed very small  wounds but these healed with time. She has never had open wounds like she does on her legs. She currently reports minimal drainage to the wounds. She does have pain to these areas but has been overall stable. She denies signs of infection. She has visited the ED for this issue and was recently prescribed doxycycline for possible cellulitis. She follows with [facility] for her SLE and is currently on Plaquenil, prednisone and CellCept. 6/27; patient presents for 1 week follow-up. She states she saw her rheumatologist on 6/22 and prednisone was increased from 10 mg to 20 mg daily. She is currently at the highest dose of Plaquenil and CellCept and is going to try an infusion later today at her regimen. She reports improvement to the wounds. She has been using collagen with dressing changes. She currently denies signs of infection. Readmission 8/26 Patient presents to clinic today for open wounds to her left lower extremity. Her previous wounds that were treated have closed. She states that on 8/9 she developed cellulitis and was treated with 2 rounds of antibiotics. Her cellulitis has resolved. She had blisters that opened and are now large wounds. She has seen her dermatologist and rheumatologist since she has systemic lupus and a hard time healing. No changes have been made to  her medications. She was recommended antibiotic ointment by her dermatologist to her wounds She picked up the antibiotic ointment today.. She reports pain to these areas. She denies purulent drainage, increased warmth or erythema to the left lower extremity. She currently keeps the areas covered. Of note she had an Achilles tendon rupture on her right leg and is currently in a soft cast. 9/13; patient presents for follow-up. She missed her last clinic appointment. She has been using Santyl to the wound beds daily. She has no issues or complaints today. She reports the Santyl is helping clean up the wounds. She currently  denies signs of infection. 9/22; patient states that 1 week ago she was seen by dermatology and they placed an Unna boot with Bactroban on her left lower extremity. Today she presents with a Unna boot in place. She currently denies signs of infection. 10/7; patient presents for follow-up. She reports improvement. She has been using Santyl to the areas of nonviable tissue and collagen to granulation tissue. She has been approved for infusions for her lupus. She is going to start these soon. These will be injection she can do at home. She currently denies signs of infection. 10/21; patient presents for follow-up. She reports continued improvement to her wound healing. She has been using Santyl to all areas except for the superior posterior left leg wound and she is using collagen to this. She has started her injection therapy for lupus. She started 1 week ago. She currently denies signs of infection. 11/3; patient presents for follow-up. She has no issues or complaints today. She is on her third week of injection therapy for lupus and tolerating this well. She has no issues or complaints today. She denies signs of infection. 11/17; patient presents for 2-week follow-up. She reports improvement in wound healing. She denies signs of infection. Patient History Information obtained from Patient. Family History Diabetes - Paternal Grandparents, Hypertension - Paternal Grandparents, Thyroid Problems - Maternal Grandparents,Paternal Grandparents, No family history of Cancer, Heart Disease, Hereditary Spherocytosis, Kidney Disease, Lung Disease, Seizures, Stroke, Tuberculosis. Social History Never smoker, Marital Status - Married, Alcohol Use - Moderate, Drug Use - Current History - Marijuana, Caffeine Use - Daily - Coffee. Medical History Respiratory Patient has history of Asthma Immunological Patient has history of Lupus Erythematosus Objective Constitutional respirations regular, non-labored and  within target range for patient.. Vitals Time Taken: 3:40 PM, Height: 62 in, Temperature: 98.7 F, Pulse: 80 bpm, Respiratory Rate: 16 breaths/min, Blood Pressure: 130/89 mmHg. Cardiovascular 2+ dorsalis pedis/posterior tibialis pulses. Psychiatric pleasant and cooperative. General Notes: Left lower extremity: 3 wounds present. 2 wounds to the posterior aspect have healed and epithelialized. The remaining wounds have granulation tissue present with scant nonviable tissue to the left lateral ankle and anterior lower leg. No surrounding signs of infection. Appears well-healing. Integumentary (Hair, Skin) Wound #3 status is Open. Original cause of wound was Gradually Appeared. The date acquired was: 04/14/2021. The wound has been in treatment 11 weeks. The wound is located on the Left,Posterior Lower Leg. The wound measures 0cm length x 0cm width x 0cm depth; 0cm^2 area and 0cm^3 volume. There is no tunneling or undermining noted. There is a none present amount of drainage noted. The wound margin is distinct with the outline attached to the wound base. There is no granulation within the wound bed. There is no necrotic tissue within the wound bed. Wound #4 status is Open. Original cause of wound was Gradually Appeared. The date acquired was: 04/14/2021. The wound  has been in treatment 11 weeks. The wound is located on the Left,Lateral Ankle. The wound measures 3.3cm length x 4.5cm width x 0.1cm depth; 11.663cm^2 area and 1.166cm^3 volume. There is Fat Layer (Subcutaneous Tissue) exposed. There is no tunneling or undermining noted. There is a medium amount of serosanguineous drainage noted. The wound margin is distinct with the outline attached to the wound base. There is large (67-100%) pink granulation within the wound bed. There is a small (1-33%) amount of necrotic tissue within the wound bed including Adherent Slough. Wound #5 status is Open. Original cause of wound was Gradually Appeared. The date  acquired was: 04/14/2021. The wound has been in treatment 11 weeks. The wound is located on the Left,Anterior Ankle. The wound measures 2cm length x 1.2cm width x 0.3cm depth; 1.885cm^2 area and 0.565cm^3 volume. There is Fat Layer (Subcutaneous Tissue) exposed. There is no tunneling noted, however, there is undermining starting at 7:00 and ending at 12:00 with a maximum distance of 0.3cm. There is a medium amount of serosanguineous drainage noted. The wound margin is distinct with the outline attached to the wound base. There is medium (34-66%) red, pink granulation within the wound bed. There is a medium (34-66%) amount of necrotic tissue within the wound bed including Adherent Slough. Wound #6 status is Open. Original cause of wound was Gradually Appeared. The date acquired was: 04/14/2021. The wound has been in treatment 11 weeks. The wound is located on the Left,Distal,Posterior Lower Leg. The wound measures 0cm length x 0cm width x 0cm depth; 0cm^2 area and 0cm^3 volume. There is no tunneling or undermining noted. There is a none present amount of drainage noted. The wound margin is distinct with the outline attached to the wound base. There is no granulation within the wound bed. There is no necrotic tissue within the wound bed. Wound #7 status is Open. Original cause of wound was Other Lesion. The date acquired was: 05/18/2021. The wound has been in treatment 8 weeks. The wound is located on the Left,Anterior Lower Leg. The wound measures 1.4cm length x 1cm width x 0.3cm depth; 1.1cm^2 area and 0.33cm^3 volume. There is Fat Layer (Subcutaneous Tissue) exposed. There is no tunneling noted, however, there is undermining starting at 12:00 and ending at 12:00 with a maximum distance of 0.8cm. There is a medium amount of serosanguineous drainage noted. The wound margin is well defined and not attached to the wound base. There is medium (34-66%) red granulation within the wound bed. There is a medium  (34-66%) amount of necrotic tissue within the wound bed including Adherent Slough. Assessment Active Problems ICD-10 Non-pressure chronic ulcer of other part of left lower leg with fat layer exposed Non-pressure chronic ulcer of other part of right lower leg with fat layer exposed Systemic lupus erythematosus, unspecified Patient's wound continue to show improvement in size and appearance. I debrided nonviable tissue. No signs of infection on exam.I recommended collagen to the anterior left lower extremity wounds. I recommended continuing Santyl to the left lateral ankle wound. She continues to do her weekly injections for treatment of lupus. I think these are really helping with her wound healing. Follow-up in 2 weeks. Procedures Wound #4 Pre-procedure diagnosis of Wound #4 is a Cellulitis located on the Left,Lateral Ankle . There was a Excisional Skin/Subcutaneous Tissue Debridement with a total area of 14.85 sq cm performed by Geralyn Corwin, DO. With the following instrument(s): Curette to remove Viable and Non-Viable tissue/material. Material removed includes Subcutaneous Tissue and Slough and.  No specimens were taken. A time out was conducted at 16:10, prior to the start of the procedure. A Minimum amount of bleeding was controlled with Pressure. The procedure was tolerated well with a pain level of 0 throughout and a pain level of 0 following the procedure. Post Debridement Measurements: 3.3cm length x 4.5cm width x 0.1cm depth; 1.166cm^3 volume. Character of Wound/Ulcer Post Debridement requires further debridement. Post procedure Diagnosis Wound #4: Same as Pre-Procedure Wound #5 Pre-procedure diagnosis of Wound #5 is a Cellulitis located on the Left,Anterior Ankle . There was a Excisional Skin/Subcutaneous Tissue Debridement with a total area of 2.4 sq cm performed by Geralyn Corwin, DO. With the following instrument(s): Curette to remove Viable and Non-Viable  tissue/material. Material removed includes Subcutaneous Tissue and Slough and. No specimens were taken. A time out was conducted at 16:10, prior to the start of the procedure. A Minimum amount of bleeding was controlled with Pressure. The procedure was tolerated well with a pain level of 0 throughout and a pain level of 0 following the procedure. Post Debridement Measurements: 2cm length x 1.2cm width x 0.3cm depth; 0.565cm^3 volume. Character of Wound/Ulcer Post Debridement is improved. Post procedure Diagnosis Wound #5: Same as Pre-Procedure Plan Follow-up Appointments: Return Appointment in 2 weeks. - Dr. Mikey Bussing Bathing/ Shower/ Hygiene: May shower and wash wound with soap and water. Edema Control - Lymphedema / SCD / Other: Elevate legs to the level of the heart or above for 30 minutes daily and/or when sitting, a frequency of: - 3-4 times a day throughout the day. Avoid standing for long periods of time. Additional Orders / Instructions: Follow Nutritious Diet WOUND #4: - Ankle Wound Laterality: Left, Lateral Cleanser: Soap and Water 1 x Per Day/30 Days Discharge Instructions: May shower and wash wound with dial antibacterial soap and water prior to dressing change. Prim Dressing: Santyl Ointment 1 x Per Day/30 Days ary Discharge Instructions: Apply nickel thick amount to wound bed as instructed Secondary Dressing: Woven Gauze Sponge, Non-Sterile 4x4 in (Generic) 1 x Per Day/30 Days Discharge Instructions: Apply over primary dressing as directed. Secured With: [employer], 4.5x3.1 (in/yd) (Generic) 1 x Per Day/30 Days Discharge Instructions: Secure with Kerlix as directed. Secured With: Paper T ape, 2x10 (in/yd) (Generic) 1 x Per Day/30 Days Discharge Instructions: Secure dressing with tape as directed. WOUND #5: - Ankle Wound Laterality: Left, Anterior Cleanser: Soap and Water 1 x Per Day/30 Days Discharge Instructions: May shower and wash wound with dial antibacterial  soap and water prior to dressing change. Prim Dressing: Promogran Prisma Matrix, 4.34 (sq in) (silver collagen) 1 x Per Day/30 Days ary Discharge Instructions: Moisten collagen with saline or hydrogel Secondary Dressing: Woven Gauze Sponge, Non-Sterile 4x4 in (Generic) 1 x Per Day/30 Days Discharge Instructions: Apply over primary dressing as directed. Secured With: [employer], 4.5x3.1 (in/yd) (Generic) 1 x Per Day/30 Days Discharge Instructions: Secure with Kerlix as directed. Secured With: Paper T ape, 2x10 (in/yd) (Generic) 1 x Per Day/30 Days Discharge Instructions: Secure dressing with tape as directed. WOUND #7: - Lower Leg Wound Laterality: Left, Anterior Cleanser: Soap and Water 1 x Per Day/30 Days Discharge Instructions: May shower and wash wound with dial antibacterial soap and water prior to dressing change. Prim Dressing: Promogran Prisma Matrix, 4.34 (sq in) (silver collagen) 1 x Per Day/30 Days ary Discharge Instructions: Moisten collagen with saline or hydrogel Secondary Dressing: Woven Gauze Sponge, Non-Sterile 4x4 in (Generic) 1 x Per Day/30 Days Discharge Instructions: Apply over primary  dressing as directed. Secured With: [employer], 4.5x3.1 (in/yd) (Generic) 1 x Per Day/30 Days Discharge Instructions: Secure with Kerlix as directed. Secured With: Paper T ape, 2x10 (in/yd) (Generic) 1 x Per Day/30 Days Discharge Instructions: Secure dressing with tape as directed. 1. In office sharp debridement 2. Santyl 3. Collagen 4. Follow-up in 2 weeks Electronic Signature(s) Signed: 07/13/2021 4:35:00 PM By: Geralyn Corwin DO Entered By: Geralyn Corwin on 07/13/2021 16:31:08 -------------------------------------------------------------------------------- HxROS Details Patient Name: Date of Service: [name] 07/13/2021 3:30 PM Medical Record Number: [id number] Patient Account Number: [id number] Date of Birth/Sex: Treating RN: 12-28-1995 (25  y.o. [name], [name] Primary Care Provider: Dione Booze Other Clinician: Referring Provider: Treating Provider/Extender: Gennaro Africa in Treatment: 21 Information Obtained From Patient Respiratory Medical History: Positive for: Asthma Immunological Medical History: Positive for: Lupus Erythematosus Immunizations Pneumococcal Vaccine: Received Pneumococcal Vaccination: No Implantable Devices None Family and Social History Cancer: No; Diabetes: Yes - Paternal Grandparents; Heart Disease: No; Hereditary Spherocytosis: No; Hypertension: Yes - Paternal Grandparents; Kidney Disease: No; Lung Disease: No; Seizures: No; Stroke: No; Thyroid Problems: Yes - Maternal Grandparents,Paternal Grandparents; Tuberculosis: No; Never smoker; Marital Status - Married; Alcohol Use: Moderate; Drug Use: Current History - Marijuana; Caffeine Use: Daily - Coffee; Financial Concerns: No; Food, Clothing or Shelter Needs: No; Support System Lacking: No Electronic Signature(s) Signed: 07/13/2021 4:35:00 PM By: Geralyn Corwin DO Signed: 07/13/2021 5:03:29 PM By: Fonnie Mu RN Entered By: Geralyn Corwin on 07/13/2021 16:26:00 -------------------------------------------------------------------------------- SuperBill Details Patient Name: Date of Service: [name] 07/13/2021 Medical Record Number: [id number] Patient Account Number: [id number] Date of Birth/Sex: Treating RN: [birth year] (25 y.o. [name], [name] Primary Care Provider: Dione Booze Other Clinician: Referring Provider: Treating Provider/Extender: Gennaro Africa in Treatment: 21 Diagnosis Coding ICD-10 Codes Code Description [id number] Non-pressure chronic ulcer of other part of left lower leg with fat layer exposed L97.812 Non-pressure chronic ulcer of other part of right lower leg with fat layer exposed M32.9 Systemic lupus erythematosus,  unspecified Facility Procedures Physician Procedures : CPT4 Code Description Modifier [id number] - WC PHYS SUBQ TISS 20 SQ CM ICD-10 Diagnosis Description L97.822 Non-pressure chronic ulcer of other part of left lower leg with fat layer exposed M32.9 Systemic lupus erythematosus, unspecified Quantity: 1 Electronic Signature(s) Signed: 07/13/2021 4:35:00 PM By: Geralyn Corwin DO Entered By: Geralyn Corwin on 07/13/2021 16:31:19

## 2021-07-26 DIAGNOSIS — S86011D Strain of right Achilles tendon, subsequent encounter: Secondary | ICD-10-CM | POA: Diagnosis not present

## 2021-07-27 ENCOUNTER — Other Ambulatory Visit: Payer: Self-pay

## 2021-07-27 ENCOUNTER — Encounter (HOSPITAL_BASED_OUTPATIENT_CLINIC_OR_DEPARTMENT_OTHER): Payer: BC Managed Care – PPO | Attending: Internal Medicine | Admitting: Internal Medicine

## 2021-07-27 DIAGNOSIS — L97822 Non-pressure chronic ulcer of other part of left lower leg with fat layer exposed: Secondary | ICD-10-CM | POA: Diagnosis not present

## 2021-07-27 DIAGNOSIS — M329 Systemic lupus erythematosus, unspecified: Secondary | ICD-10-CM | POA: Insufficient documentation

## 2021-07-27 DIAGNOSIS — L97812 Non-pressure chronic ulcer of other part of right lower leg with fat layer exposed: Secondary | ICD-10-CM | POA: Insufficient documentation

## 2021-07-27 DIAGNOSIS — L03116 Cellulitis of left lower limb: Secondary | ICD-10-CM | POA: Diagnosis not present

## 2021-07-27 DIAGNOSIS — Z833 Family history of diabetes mellitus: Secondary | ICD-10-CM | POA: Insufficient documentation

## 2021-07-31 NOTE — Progress Notes (Signed)
Kelly [name] ([id number]) Visit Report for 07/27/2021 Arrival Information Details Patient Name: Date of Service: Kelly [name]. 07/27/2021 9:30 A M Medical Record Number: [id number] Patient Account Number: [id number] Date of Birth/Sex: Treating RN: November 18, 1995 (25 y.o. Kelly [name] Primary Care Kelly [name]: Kelly [name] Other Clinician: Referring Chaquana Nichols: Treating Kijana Estock/Extender: Elsworth Soho in Treatment: 23 Visit Information History Since Last Visit Added or deleted any medications: No Patient Arrived: Ambulatory Any new allergies or adverse reactions: No Arrival Time: 09:48 Had a fall or experienced change in No Accompanied By: self activities of daily living that may affect Transfer Assistance: None risk of falls: Patient Identification Verified: Yes Signs or symptoms of abuse/neglect since last visito No Patient Requires Transmission-Based Precautions: No Hospitalized since last visit: No Patient Has Alerts: Yes Implantable device outside of the clinic excluding No Patient Alerts: Left ABi=1.18 cellular tissue based products placed in the center since last visit: Pain Present Now: No Electronic Signature(s) Signed: 07/27/2021 5:42:59 PM By: Dellie Catholic RN Entered By: Dellie Catholic on 07/27/2021 09:48:58 -------------------------------------------------------------------------------- Encounter Discharge Information Details Patient Name: Date of Service: Kelly [name] [name] 07/27/2021 9:30 A M Medical Record Number: [id number] Patient Account Number: [id number] Date of Birth/Sex: Treating RN: [birth year] (25 y.o. Kelly [name] Primary Care Aquinnah Devin: Kelly [name] Other Clinician: Referring Jahmiyah Dullea: Treating Almarosa Bohac/Extender: Elsworth Soho in Treatment: 23 Encounter Discharge Information Items Post Procedure Vitals Discharge Condition: Stable Temperature (F): 98.6 Ambulatory  Status: Ambulatory Pulse (bpm): 86 Discharge Destination: Home Respiratory Rate (breaths/min): 16 Transportation: Private Auto Blood Pressure (mmHg): 119/83 Accompanied By: alone Schedule Follow-up Appointment: Yes Clinical Summary of Care: Electronic Signature(s) Signed: 07/31/2021 3:59:15 PM By: Levan Hurst RN, BSN Entered By: Levan Hurst on 07/27/2021 13:28:34 -------------------------------------------------------------------------------- Lower Extremity Assessment Details Patient Name: Date of Service: Kelly [name] [name] 07/27/2021 9:30 A M Medical Record Number: [id number] Patient Account Number: [id number] Date of Birth/Sex: Treating RN: 1996/02/20 (25 y.o. Kelly [name] Primary Care Demari Gales: Kelly [name] Other Clinician: Referring Evalyn Shultis: Treating Delia Sitar/Extender: Elsworth Soho in Treatment: 23 Edema Assessment Assessed: Shirlyn Goltz: No] Patrice Paradise: No] Edema: [Left: Ye] [Right: s] Calf Left: Right: Point of Measurement: 27 cm From Medial Instep 32.6 cm Ankle Left: Right: Point of Measurement: 8 cm From Medial Instep 22.3 cm Electronic Signature(s) Signed: 07/27/2021 5:20:51 PM By: Rhae Hammock RN Signed: 07/27/2021 5:42:59 PM By: Dellie Catholic RN Entered By: Dellie Catholic on 07/27/2021 10:07:52 -------------------------------------------------------------------------------- Multi Wound Chart Details Patient Name: Date of Service: Kelly [name] [name] 07/27/2021 9:30 A M Medical Record Number: [id number] Patient Account Number: [id number] Date of Birth/Sex: Treating RN: 01-13-1996 (25 y.o. Kelly [name] Primary Care Shakeia Krus: Kelly [name] Other Clinician: Referring Maisey Deandrade: Treating Deloris Moger/Extender: Elsworth Soho in Treatment: 23 Vital Signs Height(in): 62 Pulse(bpm): 59 Weight(lbs): Blood Pressure(mmHg): 119/83 Body Mass Index(BMI): Temperature(F):  98.6 Respiratory Rate(breaths/min): 16 Photos: Left, Lateral Ankle Left, Anterior Ankle Left, Anterior Lower Leg Wound Location: Gradually Appeared Gradually Appeared Other Lesion Wounding Event: Cellulitis Cellulitis Lupus Primary Etiology: Lupus Lupus N/A Secondary Etiology: Asthma, Lupus Erythematosus Asthma, Lupus Erythematosus Asthma, Lupus Erythematosus Comorbid History: 04/14/2021 04/14/2021 05/18/2021 Date Acquired: $RemoveBefore'13 13 10 'iNYjpREIXalon$ Weeks of Treatment: Open Open Open Wound Status: 2.4x4.3x0.1 1.8x1x0.2 1.3x0.7x0.3 Measurements L x W x D (cm) 8.105 1.414 0.715 A (cm) : rea 0.811 0.283 0.214 Volume (cm) : 48.40% -50.10% 41.60% % Reduction in Area: 87.10% -50.50% 41.80% % Reduction in Volume: Full Thickness Without Exposed Full Thickness  Without Exposed Full Thickness Without Exposed Classification: Support Structures Support Structures Support Structures Medium Medium Medium Exudate Amount: Serosanguineous Serosanguineous Serosanguineous Exudate Type: red, brown red, brown red, brown Exudate Color: Distinct, outline attached Distinct, outline attached Well defined, not attached Wound Margin: Large (67-100%) Small (1-33%) Large (67-100%) Granulation Amount: Pink Red, Pink Red Granulation Quality: Small (1-33%) Large (67-100%) Small (1-33%) Necrotic Amount: Fat Layer (Subcutaneous Tissue): Yes Fat Layer (Subcutaneous Tissue): Yes Fat Layer (Subcutaneous Tissue): Yes Exposed Structures: Fascia: No Fascia: No Fascia: No Tendon: No Tendon: No Tendon: No Muscle: No Muscle: No Muscle: No Joint: No Joint: No Joint: No Bone: No Bone: No Bone: No None None None Epithelialization: Treatment Notes Electronic Signature(s) Signed: 07/27/2021 10:40:04 AM By: Kalman Shan DO Signed: 07/27/2021 5:20:51 PM By: Rhae Hammock RN Entered By: Kalman Shan on 07/27/2021  10:31:15 -------------------------------------------------------------------------------- Multi-Disciplinary Care Plan Details Patient Name: Date of Service: Kelly [name] [place]. 07/27/2021 9:30 A M Medical Record Number: [id number] Patient Account Number: [id number] Date of Birth/Sex: Treating RN: [birth year] (25 y.o. Kelly [name] Primary Care Tanecia Mccay: Kelly [name] Other Clinician: Referring Rivka Baune: Treating Vidit Boissonneault/Extender: Elsworth Soho in Treatment: Nord reviewed with physician Active Inactive Wound/Skin Impairment Nursing Diagnoses: Impaired tissue integrity Goals: Patient/caregiver will verbalize understanding of skin care regimen Date Initiated: 02/13/2021 Target Resolution Date: 08/25/2021 Goal Status: Active Ulcer/skin breakdown will have a volume reduction of 30% by week 4 Date Initiated: 02/13/2021 Date Inactivated: 04/21/2021 Target Resolution Date: 03/13/2021 Unmet Reason: pt did not return to Goal Status: Unmet clinic for F/U Ulcer/skin breakdown will have a volume reduction of 80% by week 12 Date Initiated: 04/21/2021 Date Inactivated: 07/13/2021 Target Resolution Date: 07/27/2021 Goal Status: Met Interventions: Assess patient/caregiver ability to obtain necessary supplies Assess patient/caregiver ability to perform ulcer/skin care regimen upon admission and as needed Assess ulceration(s) every visit Provide education on ulcer and skin care Treatment Activities: Skin care regimen initiated : 02/13/2021 Topical wound management initiated : 02/13/2021 Notes: 06/29/21: Wounds 3 and 6 only at 80% volume reduction. Target date extended. Electronic Signature(s) Signed: 07/31/2021 3:59:15 PM By: Levan Hurst RN, BSN Entered By: Levan Hurst on 07/27/2021 13:26:49 -------------------------------------------------------------------------------- Pain Assessment Details Patient Name: Date of  Service: Kelly [name] RIA Cipriano Bunker. 07/27/2021 9:30 A M Medical Record Number: [id number] Patient Account Number: [id number] Date of Birth/Sex: Treating RN: October 24, 1995 (25 y.o. Kelly [name] Primary Care Damarkus Balis: Kelly [name] Other Clinician: Referring Reade Trefz: Treating Kincade Granberg/Extender: Elsworth Soho in Treatment: 23 Active Problems Location of Pain Severity and Description of Pain Patient Has Paino No Site Locations Pain Management and Medication Current Pain Management: Electronic Signature(s) Signed: 07/27/2021 5:20:51 PM By: Rhae Hammock RN Signed: 07/27/2021 5:42:59 PM By: Dellie Catholic RN Entered By: Dellie Catholic on 07/27/2021 09:49:55 -------------------------------------------------------------------------------- Patient/Caregiver Education Details Patient Name: Date of Service: Fransico Setters. 12/1/2022andnbsp9:30 A M Medical Record Number: [id number] Patient Account Number: [id number] Date of Birth/Gender: Treating RN: Jan 23, 1996 (25 y.o. Kelly [name] Primary Care Physician: Kelly [name] Other Clinician: Referring Physician: Treating Physician/Extender: Elsworth Soho in Treatment: 23 Education Assessment Education Provided To: Patient Education Topics Provided Wound/Skin Impairment: Methods: Explain/Verbal Responses: State content correctly [employer]) Signed: 07/31/2021 3:59:15 PM By: Levan Hurst RN, BSN Entered By: Levan Hurst on 07/27/2021 13:27:18 -------------------------------------------------------------------------------- Wound Assessment Details Patient Name: Date of Service: Kelly [name] RIA Cipriano Bunker. 07/27/2021 9:30 A M Medical Record Number: [id number] Patient Account Number: [id number] Date of Birth/Sex: Treating RN: 09/17/1995 (  25 y.o. Kelly [name] Primary Care Rashard Ryle: Kelly [name] Other Clinician: Referring Kandi Brusseau: Treating  Callan Norden/Extender: Elsworth Soho in Treatment: 23 Wound Status Wound Number: 4 Primary Etiology: Cellulitis Wound Location: Left, Lateral Ankle Secondary Etiology: Lupus Wounding Event: Gradually Appeared Wound Status: Open Date Acquired: 04/14/2021 Comorbid History: Asthma, Lupus Erythematosus Weeks Of Treatment: 13 Clustered Wound: No Photos Wound Measurements Length: (cm) 2.4 Width: (cm) 4.3 Depth: (cm) 0.1 Area: (cm) 8.105 Volume: (cm) 0.811 % Reduction in Area: 48.4% % Reduction in Volume: 87.1% Epithelialization: None Tunneling: No Undermining: No Wound Description Classification: Full Thickness Without Exposed Support Structures Wound Margin: Distinct, outline attached Exudate Amount: Medium Exudate Type: Serosanguineous Exudate Color: red, brown Foul Odor After Cleansing: No Slough/Fibrino Yes Wound Bed Granulation Amount: Large (67-100%) Exposed Structure Granulation Quality: Pink Fascia Exposed: No Necrotic Amount: Small (1-33%) Fat Layer (Subcutaneous Tissue) Exposed: Yes Necrotic Quality: Adherent Slough Tendon Exposed: No Muscle Exposed: No Joint Exposed: No Bone Exposed: No Treatment Notes Wound #4 (Ankle) Wound Laterality: Left, Lateral Cleanser Soap and Water Discharge Instruction: May shower and wash wound with dial antibacterial soap and water prior to dressing change. Peri-Wound Care Topical Primary Dressing Santyl Ointment Discharge Instruction: Apply nickel thick amount to wound bed as instructed Secondary Dressing Woven Gauze Sponge, Non-Sterile 4x4 in Discharge Instruction: Apply over primary dressing as directed. Secured With [employer], 4.5x3.1 (in/yd) Discharge Instruction: Secure with Kerlix as directed. Paper Tape, 2x10 (in/yd) Discharge Instruction: Secure dressing with tape as directed. Compression Wrap Compression Stockings Add-Ons Electronic Signature(s) Signed: 07/27/2021 5:20:51 PM  By: Rhae Hammock RN Signed: 07/31/2021 3:59:15 PM By: Levan Hurst RN, BSN Entered By: Levan Hurst on 07/27/2021 10:17:16 -------------------------------------------------------------------------------- Wound Assessment Details Patient Name: Date of Service: Kelly [name] [name] 07/27/2021 9:30 A M Medical Record Number: [id number] Patient Account Number: [id number] Date of Birth/Sex: Treating RN: March 16, 1996 (24 y.o. Kelly [name] Primary Care Raushanah Osmundson: Kelly [name] Other Clinician: Referring Kimbra Marcelino: Treating Dakotah Heiman/Extender: Elsworth Soho in Treatment: 23 Wound Status Wound Number: 5 Primary Etiology: Cellulitis Wound Location: Left, Anterior Ankle Secondary Etiology: Lupus Wounding Event: Gradually Appeared Wound Status: Open Date Acquired: 04/14/2021 Comorbid History: Asthma, Lupus Erythematosus Weeks Of Treatment: 13 Clustered Wound: No Photos Wound Measurements Length: (cm) 1.8 Width: (cm) 1 Depth: (cm) 0.2 Area: (cm) 1.414 Volume: (cm) 0.283 % Reduction in Area: -50.1% % Reduction in Volume: -50.5% Epithelialization: None Tunneling: No Undermining: No Wound Description Classification: Full Thickness Without Exposed Support Structures Wound Margin: Distinct, outline attached Exudate Amount: Medium Exudate Type: Serosanguineous Exudate Color: red, brown Foul Odor After Cleansing: No Slough/Fibrino Yes Wound Bed Granulation Amount: Small (1-33%) Exposed Structure Granulation Quality: Red, Pink Fascia Exposed: No Necrotic Amount: Large (67-100%) Fat Layer (Subcutaneous Tissue) Exposed: Yes Necrotic Quality: Adherent Slough Tendon Exposed: No Muscle Exposed: No Joint Exposed: No Bone Exposed: No Treatment Notes Wound #5 (Ankle) Wound Laterality: Left, Anterior Cleanser Soap and Water Discharge Instruction: May shower and wash wound with dial antibacterial soap and water prior to dressing  change. Peri-Wound Care Topical Primary Dressing Promogran Prisma Matrix, 4.34 (sq in) (silver collagen) Discharge Instruction: Moisten collagen with saline or hydrogel Secondary Dressing Woven Gauze Sponge, Non-Sterile 4x4 in Discharge Instruction: Apply over primary dressing as directed. Secured With [employer], 4.5x3.1 (in/yd) Discharge Instruction: Secure with Kerlix as directed. Paper Tape, 2x10 (in/yd) Discharge Instruction: Secure dressing with tape as directed. Compression Wrap Compression Stockings Add-Ons Electronic Signature(s) Signed: 07/27/2021 5:20:51 PM By: Rhae Hammock RN Signed:  07/31/2021 3:59:15 PM By: Levan Hurst RN, BSN Entered By: Levan Hurst on 07/27/2021 10:18:05 -------------------------------------------------------------------------------- Wound Assessment Details Patient Name: Date of Service: Kelly [name] RIA Cipriano Bunker. 07/27/2021 9:30 A M Medical Record Number: [id number] Patient Account Number: [id number] Date of Birth/Sex: Treating RN: June 23, 1996 (25 y.o. Kelly [name] Primary Care Naija Troost: Kelly [name] Other Clinician: Referring Quade Ramirez: Treating Dontee Jaso/Extender: Elsworth Soho in Treatment: 23 Wound Status Wound Number: 7 Primary Etiology: Lupus Wound Location: Left, Anterior Lower Leg Wound Status: Open Wounding Event: Other Lesion Comorbid History: Asthma, Lupus Erythematosus Date Acquired: 05/18/2021 Weeks Of Treatment: 10 Clustered Wound: No Photos Wound Measurements Length: (cm) 1.3 Width: (cm) 0.7 Depth: (cm) 0.3 Area: (cm) 0.715 Volume: (cm) 0.214 % Reduction in Area: 41.6% % Reduction in Volume: 41.8% Epithelialization: None Tunneling: No Undermining: No Wound Description Classification: Full Thickness Without Exposed Support Structures Wound Margin: Well defined, not attached Exudate Amount: Medium Exudate Type: Serosanguineous Exudate Color: red, brown Foul  Odor After Cleansing: No Slough/Fibrino Yes Wound Bed Granulation Amount: Large (67-100%) Exposed Structure Granulation Quality: Red Fascia Exposed: No Necrotic Amount: Small (1-33%) Fat Layer (Subcutaneous Tissue) Exposed: Yes Necrotic Quality: Adherent Slough Tendon Exposed: No Muscle Exposed: No Joint Exposed: No Bone Exposed: No Treatment Notes Wound #7 (Lower Leg) Wound Laterality: Left, Anterior Cleanser Soap and Water Discharge Instruction: May shower and wash wound with dial antibacterial soap and water prior to dressing change. Peri-Wound Care Topical Primary Dressing Promogran Prisma Matrix, 4.34 (sq in) (silver collagen) Discharge Instruction: Moisten collagen with saline or hydrogel Secondary Dressing Woven Gauze Sponge, Non-Sterile 4x4 in Discharge Instruction: Apply over primary dressing as directed. Secured With [employer], 4.5x3.1 (in/yd) Discharge Instruction: Secure with Kerlix as directed. Paper Tape, 2x10 (in/yd) Discharge Instruction: Secure dressing with tape as directed. Compression Wrap Compression Stockings Add-Ons Electronic Signature(s) Signed: 07/27/2021 5:20:51 PM By: Rhae Hammock RN Signed: 07/31/2021 3:59:15 PM By: Levan Hurst RN, BSN Entered By: Levan Hurst on 07/27/2021 10:18:57 -------------------------------------------------------------------------------- Vitals Details Patient Name: Date of Service: Kelly [name] [name] 07/27/2021 9:30 A M Medical Record Number: [id number] Patient Account Number: [id number] Date of Birth/Sex: Treating RN: July 31, 1996 (25 y.o. Kelly [name] Primary Care Toney Lizaola: Kelly [name] Other Clinician: Referring Emet Rafanan: Treating Clearnce Leja/Extender: Elsworth Soho in Treatment: 23 Vital Signs Time Taken: 09:46 Temperature (F): 98.6 Height (in): 62 Pulse (bpm): 86 Respiratory Rate (breaths/min): 16 Blood Pressure (mmHg): 119/83 Reference Range:  80 - 120 mg / dl Electronic Signature(s) Signed: 07/27/2021 5:42:59 PM By: Dellie Catholic RN Entered By: Dellie Catholic on 07/27/2021 09:49:40

## 2021-07-31 NOTE — Progress Notes (Signed)
Kelly [name], Kelly [name] ([id number]) Visit Report for 07/27/2021 Chief Complaint Document Details Patient Name: Date of Service: Kelly [name], Kelly [name]. 07/27/2021 9:30 A [name] Medical Record Number: [id number] Patient Account Number: [id number] Date of Birth/Sex: Treating RN: 28-Jan-1996 (25 y.o. Kelly [name] Primary Care Provider: Dione [name] Other Clinician: Referring Provider: Treating Provider/Extender: Kelly [name] in Treatment: 23 Information Obtained from: Patient Chief Complaint Bilateral lower extremity wounds 04/21/21: left lower extremity wound s/p cellulitis Electronic Signature(s) Signed: 07/27/2021 10:40:04 AM By: Geralyn Corwin DO Entered By: Geralyn Corwin on 07/27/2021 10:31:31 -------------------------------------------------------------------------------- Debridement Details Patient Name: Date of Service: Kelly [name]. 07/27/2021 9:30 A [name] Medical Record Number: [id number] Patient Account Number: [id number] Date of Birth/Sex: Treating RN: 1996-05-10 (25 y.o. Kelly [name] Primary Care Provider: Dione [name] Other Clinician: Referring Provider: Treating Provider/Extender: Kelly [name] in Treatment: 23 Debridement Performed for Assessment: Wound #4 Left,Lateral Ankle Performed By: Clinician Kelly Abts, RN Debridement Type: Chemical/Enzymatic/Mechanical Agent Used: Santyl Level of Consciousness (Pre-procedure): Awake and Alert Pre-procedure Verification/Time Out Yes - 10:35 Taken: Start Time: 10:35 Bleeding: None End Time: 10:35 Procedural Pain: 0 Post Procedural Pain: 0 Response to Treatment: Procedure was tolerated well Level of Consciousness (Post- Awake and Alert procedure): Post Debridement Measurements of Total Wound Length: (cm) 2.4 Width: (cm) 4.3 Depth: (cm) 0.1 Volume: (cm) 0.811 Character of Wound/Ulcer Post Debridement: Requires Further Debridement Post Procedure  Diagnosis Same as Pre-procedure Electronic Signature(s) Signed: 07/27/2021 10:40:04 AM By: Geralyn Corwin DO Signed: 07/31/2021 3:59:15 PM By: Kelly Abts RN, BSN Entered By: Kelly [name] on 07/27/2021 10:37:24 -------------------------------------------------------------------------------- HPI Details Patient Name: Date of Service: Kelly [name]. 07/27/2021 9:30 A [name] Medical Record Number: [id number] Patient Account Number: [id number] Date of Birth/Sex: Treating RN: 04-07-1996 (25 y.o. Kelly [name] Primary Care Provider: Dione [name] Other Clinician: Referring Provider: Treating Provider/Extender: Kelly [name] in Treatment: 23 History of Present Illness HPI Description: Admission 6/21 [name] is A 25 year old female with a past medical history of systemic lupus erythematosus that presents with bilateral lower extremity wounds that started in April 2022. She has had skin issues in the past where she developed very small wounds but these healed with time. She has never had open wounds like she does on her legs. She currently reports minimal drainage to the wounds. She does have pain to these areas but has been overall stable. She denies signs of infection. She has visited the ED for this issue and was recently prescribed doxycycline for possible cellulitis. She follows with [facility] for her SLE and is currently on Plaquenil, prednisone and CellCept. 6/27; patient presents for 1 week follow-up. She states she saw her rheumatologist on 6/22 and prednisone was increased from 10 mg to 20 mg daily. She is currently at the highest dose of Plaquenil and CellCept and is going to try an infusion later today at her regimen. She reports improvement to the wounds. She has been using collagen with dressing changes. She currently denies signs of infection. Readmission 8/26 Patient presents to clinic today for open wounds to her left  lower extremity. Her previous wounds that were treated have closed. She states that on 8/9 she developed cellulitis and was treated with 2 rounds of antibiotics. Her cellulitis has resolved. She had blisters that opened and are now large wounds. She has seen her dermatologist and rheumatologist since she has systemic lupus and a hard time healing. No changes have been made  to her medications. She was recommended antibiotic ointment by her dermatologist to her wounds She picked up the antibiotic ointment today.. She reports pain to these areas. She denies purulent drainage, increased warmth or erythema to the left lower extremity. She currently keeps the areas covered. Of note she had an Achilles tendon rupture on her right leg and is currently in a soft cast. 9/13; patient presents for follow-up. She missed her last clinic appointment. She has been using Santyl to the wound beds daily. She has no issues or complaints today. She reports the Santyl is helping clean up the wounds. She currently denies signs of infection. 9/22; patient states that 1 week ago she was seen by dermatology and they placed an Unna boot with Bactroban on her left lower extremity. Today she presents with a Unna boot in place. She currently denies signs of infection. 10/7; patient presents for follow-up. She reports improvement. She has been using Santyl to the areas of nonviable tissue and collagen to granulation tissue. She has been approved for infusions for her lupus. She is going to start these soon. These will be injection she can do at home. She currently denies signs of infection. 10/21; patient presents for follow-up. She reports continued improvement to her wound healing. She has been using Santyl to all areas except for the superior posterior left leg wound and she is using collagen to this. She has started her injection therapy for lupus. She started 1 week ago. She currently denies signs of infection. 11/3; patient  presents for follow-up. She has no issues or complaints today. She is on her third week of injection therapy for lupus and tolerating this well. She has no issues or complaints today. She denies signs of infection. 11/17; patient presents for 2-week follow-up. She reports improvement in wound healing. She denies signs of infection. 12/1; patient presents for follow-up. She has been using collagen to the anterior left lower extremity wounds and Santyl to the left lateral malleolus. She has no issues or complaints today. Electronic Signature(s) Signed: 07/27/2021 10:40:04 AM By: Geralyn Corwin DO Entered By: Geralyn Corwin on 07/27/2021 10:32:18 -------------------------------------------------------------------------------- Physical Exam Details Patient Name: Date of Service: Kelly Milling RIA Cherly Anderson. 07/27/2021 9:30 A [name] Medical Record Number: [id number] Patient Account Number: [id number] Date of Birth/Sex: Treating RN: 1995/11/24 (25 y.o. Kelly [name] Primary Care Provider: Dione [name] Other Clinician: Referring Provider: Treating Provider/Extender: Kelly [name] in Treatment: 23 Constitutional respirations regular, non-labored and within target range for patient.. Cardiovascular 2+ dorsalis pedis/posterior tibialis pulses. Psychiatric pleasant and cooperative. Notes Left lower extremity: 3 wounds present. 2 wounds to the anterior lower leg have granulation tissue present. The left lateral ankle has more granulation tissue present than last clinic visit and some scant nonviable tissue. No signs of infection on exam. Everything appears well-healing. Electronic Signature(s) Signed: 07/27/2021 10:40:04 AM By: Geralyn Corwin DO Entered By: Geralyn Corwin on 07/27/2021 10:33:38 -------------------------------------------------------------------------------- Physician Orders Details Patient Name: Date of Service: Kelly [name]. 07/27/2021  9:30 A [name] Medical Record Number: [id number] Patient Account Number: [id number] Date of Birth/Sex: Treating RN: Oct 27, 1995 (25 y.o. Kelly [name] Primary Care Provider: Dione [name] Other Clinician: Referring Provider: Treating Provider/Extender: Kelly [name] in Treatment: 23 Verbal / Phone Orders: No Diagnosis Coding ICD-10 Coding Code Description [phone] Non-pressure chronic ulcer of other part of left lower leg with fat layer exposed L97.812 Non-pressure chronic ulcer of other part of right lower leg with fat layer exposed  M32.9 Systemic lupus erythematosus, unspecified Follow-up Appointments ppointment in 2 weeks. - Dr. Mikey Bussing Return A Bathing/ Shower/ Hygiene May shower and wash wound with soap and water. Edema Control - Lymphedema / SCD / Other Elevate legs to the level of the heart or above for 30 minutes daily and/or when sitting, a frequency of: - 3-4 times a day throughout the day. Avoid standing for long periods of time. Additional Orders / Instructions Follow Nutritious Diet Wound Treatment Wound #4 - Ankle Wound Laterality: Left, Lateral Cleanser: Soap and Water 1 x Per Day/30 Days Discharge Instructions: May shower and wash wound with dial antibacterial soap and water prior to dressing change. Prim Dressing: Santyl Ointment 1 x Per Day/30 Days ary Discharge Instructions: Apply nickel thick amount to wound bed as instructed Secondary Dressing: Woven Gauze Sponge, Non-Sterile 4x4 in (Generic) 1 x Per Day/30 Days Discharge Instructions: Apply over primary dressing as directed. Secured With: [employer], 4.5x3.1 (in/yd) (Generic) 1 x Per Day/30 Days Discharge Instructions: Secure with Kerlix as directed. Secured With: Paper Tape, 2x10 (in/yd) (Generic) 1 x Per Day/30 Days Discharge Instructions: Secure dressing with tape as directed. Wound #5 - Ankle Wound Laterality: Left, Anterior Cleanser: Soap and Water 1 x Per Day/30  Days Discharge Instructions: May shower and wash wound with dial antibacterial soap and water prior to dressing change. Prim Dressing: Promogran Prisma Matrix, 4.34 (sq in) (silver collagen) 1 x Per Day/30 Days ary Discharge Instructions: Moisten collagen with saline or hydrogel Secondary Dressing: Woven Gauze Sponge, Non-Sterile 4x4 in (Generic) 1 x Per Day/30 Days Discharge Instructions: Apply over primary dressing as directed. Secured With: [employer], 4.5x3.1 (in/yd) (Generic) 1 x Per Day/30 Days Discharge Instructions: Secure with Kerlix as directed. Secured With: Paper Tape, 2x10 (in/yd) (Generic) 1 x Per Day/30 Days Discharge Instructions: Secure dressing with tape as directed. Wound #7 - Lower Leg Wound Laterality: Left, Anterior Cleanser: Soap and Water 1 x Per Day/30 Days Discharge Instructions: May shower and wash wound with dial antibacterial soap and water prior to dressing change. Prim Dressing: Promogran Prisma Matrix, 4.34 (sq in) (silver collagen) 1 x Per Day/30 Days ary Discharge Instructions: Moisten collagen with saline or hydrogel Secondary Dressing: Woven Gauze Sponge, Non-Sterile 4x4 in (Generic) 1 x Per Day/30 Days Discharge Instructions: Apply over primary dressing as directed. Secured With: [employer], 4.5x3.1 (in/yd) (Generic) 1 x Per Day/30 Days Discharge Instructions: Secure with Kerlix as directed. Secured With: Paper Tape, 2x10 (in/yd) (Generic) 1 x Per Day/30 Days Discharge Instructions: Secure dressing with tape as directed. Patient Medications llergies: No Known Allergies A Notifications Medication Indication Start End 07/27/2021 Santyl DOSE 1 - topical 250 unit/gram ointment - 1 application daily for 15 days Electronic Signature(s) Signed: 07/27/2021 10:40:04 AM By: Geralyn Corwin DO Previous Signature: 07/27/2021 10:37:40 AM Version By: Geralyn Corwin DO Entered By: Geralyn Corwin on 07/27/2021  10:38:07 -------------------------------------------------------------------------------- Problem List Details Patient Name: Date of Service: Kelly [name]. 07/27/2021 9:30 A [name] Medical Record Number: [id number] Patient Account Number: [id number] Date of Birth/Sex: Treating RN: 04-Jan-1996 (25 y.o. Kelly [name] Primary Care Provider: Dione [name] Other Clinician: Referring Provider: Treating Provider/Extender: Kelly [name] in Treatment: 23 Active Problems ICD-10 Encounter Code Description Active Date MDM Diagnosis [phone] Non-pressure chronic ulcer of other part of left lower leg with fat layer exposed11/10/2020 No Yes L97.812 Non-pressure chronic ulcer of other part of right lower leg with fat layer 06/29/2021 No Yes exposed M32.9 Systemic lupus erythematosus,  unspecified 02/14/2021 No Yes Inactive Problems ICD-10 Code Description Active Date Inactive Date L97.819 Non-pressure chronic ulcer of other part of right lower leg with unspecified severity 02/13/2021 02/13/2021 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 with unspecified severity 02/13/2021 02/13/2021 J18.841Y Unspecified open wound, left lower leg, initial encounter 04/21/2021 04/21/2021 S91.002A Unspecified open wound, left ankle, initial encounter 04/21/2021 04/21/2021 Resolved Problems Electronic Signature(s) Signed: 07/27/2021 10:40:04 AM By: Geralyn Corwin DO Entered By: Geralyn Corwin on 07/27/2021 10:31:04 -------------------------------------------------------------------------------- Progress Note Details Patient Name: Date of Service: Kelly [name]. 07/27/2021 9:30 A [name] Medical Record Number: [id number] Patient Account Number: [id number] Date of Birth/Sex: Treating RN: July 31, 1996 (25 y.o. Kelly [name] Primary Care Provider: Dione [name] Other Clinician: Referring Provider: Treating Provider/Extender: Kelly [name] in Treatment: 23 Subjective Chief Complaint Information obtained from Patient Bilateral lower extremity wounds 04/21/21: left lower extremity wound s/p cellulitis History of Present Illness (HPI) Admission 6/21 Ms. Ariabella Brien is A 25 year old female with a past medical history of systemic lupus erythematosus that presents with bilateral lower extremity wounds that started in April 2022. She has had skin issues in the past where she developed very small wounds but these healed with time. She has never had open wounds like she does on her legs. She currently reports minimal drainage to the wounds. She does have pain to these areas but has been overall stable. She denies signs of infection. She has visited the ED for this issue and was recently prescribed doxycycline for possible cellulitis. She follows with [facility] for her SLE and is currently on Plaquenil, prednisone and CellCept. 6/27; patient presents for 1 week follow-up. She states she saw her rheumatologist on 6/22 and prednisone was increased from 10 mg to 20 mg daily. She is currently at the highest dose of Plaquenil and CellCept and is going to try an infusion later today at her regimen. She reports improvement to the wounds. She has been using collagen with dressing changes. She currently denies signs of infection. Readmission 8/26 Patient presents to clinic today for open wounds to her left lower extremity. Her previous wounds that were treated have closed. She states that on 8/9 she developed cellulitis and was treated with 2 rounds of antibiotics. Her cellulitis has resolved. She had blisters that opened and are now large wounds. She has seen her dermatologist and rheumatologist since she has systemic lupus and a hard time healing. No changes have been made to her medications. She was recommended antibiotic ointment by her dermatologist to her wounds She picked up the antibiotic ointment today.. She reports  pain to these areas. She denies purulent drainage, increased warmth or erythema to the left lower extremity. She currently keeps the areas covered. Of note she had an Achilles tendon rupture on her right leg and is currently in a soft cast. 9/13; patient presents for follow-up. She missed her last clinic appointment. She has been using Santyl to the wound beds daily. She has no issues or complaints today. She reports the Santyl is helping clean up the wounds. She currently denies signs of infection. 9/22; patient states that 1 week ago she was seen by dermatology and they placed an Unna boot with Bactroban on her left lower extremity. Today she presents with a Unna boot in place. She currently denies signs of infection. 10/7; patient presents for follow-up. She reports improvement. She has been using Santyl to the areas of nonviable tissue and collagen to granulation  tissue. She has been approved for infusions for her lupus. She is going to start these soon. These will be injection she can do at home. She currently denies signs of infection. 10/21; patient presents for follow-up. She reports continued improvement to her wound healing. She has been using Santyl to all areas except for the superior posterior left leg wound and she is using collagen to this. She has started her injection therapy for lupus. She started 1 week ago. She currently denies signs of infection. 11/3; patient presents for follow-up. She has no issues or complaints today. She is on her third week of injection therapy for lupus and tolerating this well. She has no issues or complaints today. She denies signs of infection. 11/17; patient presents for 2-week follow-up. She reports improvement in wound healing. She denies signs of infection. 12/1; patient presents for follow-up. She has been using collagen to the anterior left lower extremity wounds and Santyl to the left lateral malleolus. She has no issues or complaints  today. Patient History Information obtained from Patient. Family History Diabetes - Paternal Grandparents, Hypertension - Paternal Grandparents, Thyroid Problems - Maternal Grandparents,Paternal Grandparents, No family history of Cancer, Heart Disease, Hereditary Spherocytosis, Kidney Disease, Lung Disease, Seizures, Stroke, Tuberculosis. Social History Never smoker, Marital Status - Married, Alcohol Use - Moderate, Drug Use - Current History - Marijuana, Caffeine Use - Daily - Coffee. Medical History Respiratory Patient has history of Asthma Immunological Patient has history of Lupus Erythematosus Objective Constitutional respirations regular, non-labored and within target range for patient.. Vitals Time Taken: 9:46 AM, Height: 62 in, Temperature: 98.6 F, Pulse: 86 bpm, Respiratory Rate: 16 breaths/min, Blood Pressure: 119/83 mmHg. Cardiovascular 2+ dorsalis pedis/posterior tibialis pulses. Psychiatric pleasant and cooperative. General Notes: Left lower extremity: 3 wounds present. 2 wounds to the anterior lower leg have granulation tissue present. The left lateral ankle has more granulation tissue present than last clinic visit and some scant nonviable tissue. No signs of infection on exam. Everything appears well-healing. Integumentary (Hair, Skin) Wound #4 status is Open. Original cause of wound was Gradually Appeared. The date acquired was: 04/14/2021. The wound has been in treatment 13 weeks. The wound is located on the Left,Lateral Ankle. The wound measures 2.4cm length x 4.3cm width x 0.1cm depth; 8.105cm^2 area and 0.811cm^3 volume. There is Fat Layer (Subcutaneous Tissue) exposed. There is no tunneling or undermining noted. There is a medium amount of serosanguineous drainage noted. The wound margin is distinct with the outline attached to the wound base. There is large (67-100%) pink granulation within the wound bed. There is a small (1-33%) amount of necrotic tissue within  the wound bed including Adherent Slough. Wound #5 status is Open. Original cause of wound was Gradually Appeared. The date acquired was: 04/14/2021. The wound has been in treatment 13 weeks. The wound is located on the Left,Anterior Ankle. The wound measures 1.8cm length x 1cm width x 0.2cm depth; 1.414cm^2 area and 0.283cm^3 volume. There is Fat Layer (Subcutaneous Tissue) exposed. There is no tunneling or undermining noted. There is a medium amount of serosanguineous drainage noted. The wound margin is distinct with the outline attached to the wound base. There is small (1-33%) red, pink granulation within the wound bed. There is a large (67- 100%) amount of necrotic tissue within the wound bed including Adherent Slough. Wound #7 status is Open. Original cause of wound was Other Lesion. The date acquired was: 05/18/2021. The wound has been in treatment 10 weeks. The wound is located on  the Left,Anterior Lower Leg. The wound measures 1.3cm length x 0.7cm width x 0.3cm depth; 0.715cm^2 area and 0.214cm^3 volume. There is Fat Layer (Subcutaneous Tissue) exposed. There is no tunneling or undermining noted. There is a medium amount of serosanguineous drainage noted. The wound margin is well defined and not attached to the wound base. There is large (67-100%) red granulation within the wound bed. There is a small (1-33%) amount of necrotic tissue within the wound bed including Adherent Slough. Assessment Active Problems ICD-10 Non-pressure chronic ulcer of other part of left lower leg with fat layer exposed Non-pressure chronic ulcer of other part of right lower leg with fat layer exposed Systemic lupus erythematosus, unspecified Patient's wounds have shown improvement in size in appearance since last clinic visit. I recommended continuing collagen to the left anterior leg wounds and Santyl to the left ankle wound. No signs of infection on exam. Follow-up in 2 weeks. Procedures Wound #4 Pre-procedure  diagnosis of Wound #4 is a Cellulitis located on the Left,Lateral Ankle . There was a Chemical/Enzymatic/Mechanical debridement performed by Kelly Abts, RN.Marland Kitchen Agent used was [employer]. A time out was conducted at 10:35, prior to the start of the procedure. There was no bleeding. The procedure was tolerated well with a pain level of 0 throughout and a pain level of 0 following the procedure. Post Debridement Measurements: 2.4cm length x 4.3cm width x 0.1cm depth; 0.811cm^3 volume. Character of Wound/Ulcer Post Debridement requires further debridement. Post procedure Diagnosis Wound #4: Same as Pre-Procedure Plan Follow-up Appointments: Return Appointment in 2 weeks. - Dr. Mikey Bussing Bathing/ Shower/ Hygiene: May shower and wash wound with soap and water. Edema Control - Lymphedema / SCD / Other: Elevate legs to the level of the heart or above for 30 minutes daily and/or when sitting, a frequency of: - 3-4 times a day throughout the day. Avoid standing for long periods of time. Additional Orders / Instructions: Follow Nutritious Diet The following medication(s) was prescribed: Santyl topical 250 unit/gram ointment 1 1 application daily for 15 days starting 07/27/2021 WOUND #4: - Ankle Wound Laterality: Left, Lateral Cleanser: Soap and Water 1 x Per Day/30 Days Discharge Instructions: May shower and wash wound with dial antibacterial soap and water prior to dressing change. Prim Dressing: Santyl Ointment 1 x Per Day/30 Days ary Discharge Instructions: Apply nickel thick amount to wound bed as instructed Secondary Dressing: Woven Gauze Sponge, Non-Sterile 4x4 in (Generic) 1 x Per Day/30 Days Discharge Instructions: Apply over primary dressing as directed. Secured With: [employer], 4.5x3.1 (in/yd) (Generic) 1 x Per Day/30 Days Discharge Instructions: Secure with Kerlix as directed. Secured With: Paper T ape, 2x10 (in/yd) (Generic) 1 x Per Day/30 Days Discharge Instructions: Secure  dressing with tape as directed. WOUND #5: - Ankle Wound Laterality: Left, Anterior Cleanser: Soap and Water 1 x Per Day/30 Days Discharge Instructions: May shower and wash wound with dial antibacterial soap and water prior to dressing change. Prim Dressing: Promogran Prisma Matrix, 4.34 (sq in) (silver collagen) 1 x Per Day/30 Days ary Discharge Instructions: Moisten collagen with saline or hydrogel Secondary Dressing: Woven Gauze Sponge, Non-Sterile 4x4 in (Generic) 1 x Per Day/30 Days Discharge Instructions: Apply over primary dressing as directed. Secured With: [employer], 4.5x3.1 (in/yd) (Generic) 1 x Per Day/30 Days Discharge Instructions: Secure with Kerlix as directed. Secured With: Paper T ape, 2x10 (in/yd) (Generic) 1 x Per Day/30 Days Discharge Instructions: Secure dressing with tape as directed. WOUND #7: - Lower Leg Wound Laterality: Left, Anterior Cleanser:  Soap and Water 1 x Per Day/30 Days Discharge Instructions: May shower and wash wound with dial antibacterial soap and water prior to dressing change. Prim Dressing: Promogran Prisma Matrix, 4.34 (sq in) (silver collagen) 1 x Per Day/30 Days ary Discharge Instructions: Moisten collagen with saline or hydrogel Secondary Dressing: Woven Gauze Sponge, Non-Sterile 4x4 in (Generic) 1 x Per Day/30 Days Discharge Instructions: Apply over primary dressing as directed. Secured With: [employer], 4.5x3.1 (in/yd) (Generic) 1 x Per Day/30 Days Discharge Instructions: Secure with Kerlix as directed. Secured With: Paper T ape, 2x10 (in/yd) (Generic) 1 x Per Day/30 Days Discharge Instructions: Secure dressing with tape as directed. 1. Collagen 2. Santyl 3. Follow-up in 2 weeks Electronic Signature(s) Signed: 07/27/2021 10:40:04 AM By: Geralyn Corwin DO Signed: 07/27/2021 10:40:04 AM By: Geralyn Corwin DO Entered By: Geralyn Corwin on 07/27/2021  10:38:35 -------------------------------------------------------------------------------- HxROS Details Patient Name: Date of Service: Kelly [name]. 07/27/2021 9:30 A [name] Medical Record Number: [id number] Patient Account Number: [id number] Date of Birth/Sex: Treating RN: October 08, 1995 (25 y.o. Kelly [name] Primary Care Provider: Dione [name] Other Clinician: Referring Provider: Treating Provider/Extender: Kelly [name] in Treatment: 23 Information Obtained From Patient Respiratory Medical History: Positive for: Asthma Immunological Medical History: Positive for: Lupus Erythematosus Immunizations Pneumococcal Vaccine: Received Pneumococcal Vaccination: No Implantable Devices None Family and Social History Cancer: No; Diabetes: Yes - Paternal Grandparents; Heart Disease: No; Hereditary Spherocytosis: No; Hypertension: Yes - Paternal Grandparents; Kidney Disease: No; Lung Disease: No; Seizures: No; Stroke: No; Thyroid Problems: Yes - Maternal Grandparents,Paternal Grandparents; Tuberculosis: No; Never smoker; Marital Status - Married; Alcohol Use: Moderate; Drug Use: Current History - Marijuana; Caffeine Use: Daily - Coffee; Financial Concerns: No; Food, Clothing or Shelter Needs: No; Support System Lacking: No Electronic Signature(s) Signed: 07/27/2021 10:40:04 AM By: Geralyn Corwin DO Signed: 07/27/2021 5:20:51 PM By: Fonnie Mu RN Entered By: Geralyn Corwin on 07/27/2021 10:32:28 -------------------------------------------------------------------------------- SuperBill Details Patient Name: Date of Service: Kelly [name]. 07/27/2021 Medical Record Number: [id number] Patient Account Number: [id number] Date of Birth/Sex: Treating RN: 07/23/1996 (25 y.o. Kelly [name] Primary Care Provider: Dione [name] Other Clinician: Referring Provider: Treating Provider/Extender: Kelly [name] in Treatment: 23 Diagnosis Coding ICD-10 Codes Code Description [phone] Non-pressure chronic ulcer of other part of left lower leg with fat layer exposed L97.812 Non-pressure chronic ulcer of other part of right lower leg with fat layer exposed M32.9 Systemic lupus erythematosus, unspecified Facility Procedures CPT4 Code: [id number] Description: [phone] - DEBRIDE W/O ANES NON SELECT Modifier: Quantity: 1 Physician Procedures : CPT4 Code Description Modifier [id number] - WC PHYS LEVEL 3 - EST PT ICD-10 Diagnosis Description L97.822 Non-pressure chronic ulcer of other part of left lower leg with fat layer exposed L97.812 Non-pressure chronic ulcer of other part of right  lower leg with fat layer exposed M32.9 Systemic lupus erythematosus, unspecified Quantity: 1 Electronic Signature(s) Signed: 07/27/2021 10:40:04 AM By: Geralyn Corwin DO Entered By: Geralyn Corwin on 07/27/2021 10:39:13

## 2021-08-07 DIAGNOSIS — L97911 Non-pressure chronic ulcer of unspecified part of right lower leg limited to breakdown of skin: Secondary | ICD-10-CM | POA: Diagnosis not present

## 2021-08-07 DIAGNOSIS — L98492 Non-pressure chronic ulcer of skin of other sites with fat layer exposed: Secondary | ICD-10-CM | POA: Diagnosis not present

## 2021-08-07 DIAGNOSIS — M329 Systemic lupus erythematosus, unspecified: Secondary | ICD-10-CM | POA: Diagnosis not present

## 2021-08-10 ENCOUNTER — Other Ambulatory Visit: Payer: Self-pay

## 2021-08-10 ENCOUNTER — Encounter (HOSPITAL_BASED_OUTPATIENT_CLINIC_OR_DEPARTMENT_OTHER): Payer: BC Managed Care – PPO | Admitting: Internal Medicine

## 2021-08-10 DIAGNOSIS — M329 Systemic lupus erythematosus, unspecified: Secondary | ICD-10-CM | POA: Diagnosis not present

## 2021-08-10 DIAGNOSIS — L03116 Cellulitis of left lower limb: Secondary | ICD-10-CM | POA: Diagnosis not present

## 2021-08-10 DIAGNOSIS — Z833 Family history of diabetes mellitus: Secondary | ICD-10-CM | POA: Diagnosis not present

## 2021-08-10 DIAGNOSIS — L97812 Non-pressure chronic ulcer of other part of right lower leg with fat layer exposed: Secondary | ICD-10-CM | POA: Diagnosis not present

## 2021-08-10 DIAGNOSIS — L97822 Non-pressure chronic ulcer of other part of left lower leg with fat layer exposed: Secondary | ICD-10-CM

## 2021-08-10 NOTE — Progress Notes (Signed)
Kelly [name], Kelly [name] ([id number]) Visit Report for 08/10/2021 Arrival Information Details Patient Name: Date of Service: Kelly [name], Kelly [name]. 08/10/2021 10:15 A M Medical Record Number: [id number] Patient Account Number: [id number] Date of Birth/Sex: Treating RN: 03-Nov-1995 (25 y.o. Sue Lush Primary Care Forever Arechiga: [name] Other Clinician: Referring Emil Klassen: Treating Jayona Mccaig/Extender: Elsworth Soho in Treatment: 25 Visit Information History Since Last Visit Added or deleted any medications: No Patient Arrived: Ambulatory Any new allergies or adverse reactions: No Arrival Time: 10:26 Had a fall or experienced change in No Transfer Assistance: None activities of daily living that may affect Patient Identification Verified: Yes risk of falls: Secondary Verification Process Completed: Yes Signs or symptoms of abuse/neglect since last visito No Patient Requires Transmission-Based Precautions: No Hospitalized since last visit: No Patient Has Alerts: Yes Implantable device outside of the clinic excluding No Patient Alerts: Left ABi=1.18 cellular tissue based products placed in the center since last visit: Has Dressing in Place as Prescribed: Yes Pain Present Now: No Electronic Signature(s) Signed: 08/10/2021 5:42:26 PM By: Lorrin Jackson Entered By: Lorrin Jackson on 08/10/2021 10:28:29 -------------------------------------------------------------------------------- Encounter Discharge Information Details Patient Name: Date of Service: Kelly Lou [name] 08/10/2021 10:15 A M Medical Record Number: [id number] Patient Account Number: [id number] Date of Birth/Sex: Treating RN: October 24, 1995 (25 y.o. Sue Lush Primary Care Quindarrius [place]: [name] Other Clinician: Referring Varnika Butz: Treating Yuktha Kerchner/Extender: Elsworth Soho in Treatment: 25 Encounter Discharge Information Items Post Procedure  Vitals Discharge Condition: Stable Temperature (F): 98.9 Ambulatory Status: Ambulatory Pulse (bpm): 93 Discharge Destination: Home Respiratory Rate (breaths/min): 18 Transportation: Private Auto Blood Pressure (mmHg): 123/82 Schedule Follow-up Appointment: Yes Clinical Summary of Care: Provided on 08/10/2021 Form Type Recipient Paper Patient Patient Electronic Signature(s) Signed: 08/10/2021 5:42:26 PM By: Lorrin Jackson Entered By: Lorrin Jackson on 08/10/2021 10:53:19 -------------------------------------------------------------------------------- Lower Extremity Assessment Details Patient Name: Date of Service: Kelly [name], Kelly [name]. 08/10/2021 10:15 A M Medical Record Number: [id number] Patient Account Number: [id number] Date of Birth/Sex: Treating RN: 05/27/1996 (25 y.o. Sue Lush Primary Care Eleftherios Dudenhoeffer: [name] Other Clinician: Referring Maeven Mcdougall: Treating Arch Methot/Extender: Elsworth Soho in Treatment: 25 Edema Assessment Assessed: Shirlyn Goltz: Yes] Patrice Paradise: No] Edema: [Left: N] [Right: o] Calf Left: Right: Point of Measurement: 27 cm From Medial Instep 34 cm Ankle Left: Right: Point of Measurement: 8 cm From Medial Instep 22 cm Vascular Assessment Pulses: Dorsalis Pedis Palpable: [Left:Yes] Electronic Signature(s) Signed: 08/10/2021 5:42:26 PM By: Lorrin Jackson Entered By: Lorrin Jackson on 08/10/2021 10:36:35 -------------------------------------------------------------------------------- Multi Wound Chart Details Patient Name: Date of Service: Kelly Lou [name] 08/10/2021 10:15 A M Medical Record Number: [id number] Patient Account Number: [id number] Date of Birth/Sex: Treating RN: Nov 27, 1995 (25 y.o. F) Primary Care Saudia Smyser: [name] Other Clinician: Referring Jerret Mcbane: Treating Annelyse Rey/Extender: Elsworth Soho in Treatment: 25 Vital Signs Height(in): 74 Pulse(bpm):  38 Weight(lbs): Blood Pressure(mmHg): 123/82 Body Mass Index(BMI): Temperature(F): 98.9 Respiratory Rate(breaths/min): 18 Photos: [4:No Photos Left, Lateral Ankle] [5:No Photos Left, Anterior Ankle] [7:No Photos Left, Anterior Lower Leg] Wound Location: [4:Gradually Appeared] [5:Gradually Appeared] [7:Other Lesion] Wounding Event: [4:Cellulitis] [5:Cellulitis] [7:Lupus] Primary Etiology: [4:Lupus] [5:Lupus] [7:N/A] Secondary Etiology: [4:Asthma, Lupus Erythematosus] [5:Asthma, Lupus Erythematosus] [7:Asthma, Lupus Erythematosus] Comorbid History: [4:04/14/2021] [5:04/14/2021] [7:05/18/2021] Date Acquired: [4:15] [5:15] [7:12] Weeks of Treatment: [4:Open] [5:Open] [7:Open] Wound Status: [4:2.4x3.1x0.1] [5:1.5x0.7x0.2] [7:0.7x0.7x0.2] Measurements L x W x D (cm) [4:5.843] [5:0.825] [7:0.385] A (cm) : rea [4:0.584] [5:0.165] [7:0.077] Volume (cm) : [4:62.80%] [5:12.40%] [7:68.60%] % Reduction in  Area: [4:90.70%] [5:12.20%] [7:79.10%] % Reduction in Volume: [4:Full Thickness Without Exposed] [5:Full Thickness Without Exposed] [7:Full Thickness Without Exposed] Classification: [4:Support Structures Medium] [5:Support Structures Medium] [7:Support Structures Medium] Exudate A mount: [4:Serosanguineous] [5:Serosanguineous] [7:Serosanguineous] Exudate Type: [4:red, brown] [5:red, brown] [7:red, brown] Exudate Color: [4:Distinct, outline attached] [5:Distinct, outline attached] [7:Well defined, not attached] Wound Margin: [4:Large (67-100%)] [5:Medium (34-66%)] [7:Large (67-100%)] Granulation A mount: [4:Pink] [5:Red, Pink] [7:Red, Pink] Granulation Quality: [4:Small (1-33%)] [5:Medium (34-66%)] [7:Small (1-33%)] Necrotic A mount: [4:Fat Layer (Subcutaneous Tissue): Yes Fat Layer (Subcutaneous Tissue): Yes Fat Layer (Subcutaneous Tissue): Yes] Exposed Structures: [4:Fascia: No Tendon: No Muscle: No Joint: No Bone: No Medium (34-66%)] [5:Fascia: No Tendon: No Muscle: No Joint: No Bone: No None]  [7:Fascia: No Tendon: No Muscle: No Joint: No Bone: No Medium (34-66%)] Epithelialization: [4:Debridement - Excisional] [5:Debridement - Excisional] [7:Debridement - Excisional] Debridement: Pre-procedure Verification/Time Out 10:39 [5:10:39] [7:10:39] Taken: [4:Other] [5:Other] [7:Other] Pain Control: [4:Subcutaneous, Slough] [5:Subcutaneous, Slough] [7:Subcutaneous, Slough] Tissue Debrided: [4:Skin/Subcutaneous Tissue] [5:Skin/Subcutaneous Tissue] [7:Skin/Subcutaneous Tissue] Level: [4:7.44] [5:1.05] [7:0.49] Debridement A (sq cm): [4:rea Curette] [5:Curette] [7:Curette] Instrument: [4:Minimum] [5:Minimum] [7:Minimum] Bleeding: [4:Pressure] [5:Pressure] [7:Pressure] Hemostasis A chieved: [4:Procedure was tolerated well] [5:Procedure was tolerated well] [7:Procedure was tolerated well] Debridement Treatment Response: [4:2.4x3.1x0.1] [5:1.5x0.7x0.2] [7:0.7x0.7x0.2] Post Debridement Measurements L x W x D (cm) [4:0.584] [5:0.165] [7:0.077] Post Debridement Volume: (cm) [4:Debridement] [5:Debridement] [7:Debridement] Treatment Notes Electronic Signature(s) Signed: 08/10/2021 10:55:22 AM By: Kalman Shan DO Entered By: Kalman Shan on 08/10/2021 10:50:14 -------------------------------------------------------------------------------- Multi-Disciplinary Care Plan Details Patient Name: Date of Service: Kelly Lou [place]. 08/10/2021 10:15 A M Medical Record Number: [id number] Patient Account Number: [id number] Date of Birth/Sex: Treating RN: Jul 04, 1996 (25 y.o. Sue Lush Primary Care Kyzer Blowe: [name] Other Clinician: Referring Sissy Goetzke: Treating Jakarri Lesko/Extender: Elsworth Soho in Treatment: 25 Multidisciplinary Care Plan reviewed with physician Active Inactive Wound/Skin Impairment Nursing Diagnoses: Impaired tissue integrity Goals: Patient/caregiver will verbalize understanding of skin care regimen Date Initiated:  02/13/2021 Target Resolution Date: 08/25/2021 Goal Status: Active Ulcer/skin breakdown will have a volume reduction of 30% by week 4 Date Initiated: 02/13/2021 Date Inactivated: 04/21/2021 Target Resolution Date: 03/13/2021 Unmet Reason: pt did not return to Goal Status: Unmet clinic for F/U Ulcer/skin breakdown will have a volume reduction of 80% by week 12 Date Initiated: 04/21/2021 Date Inactivated: 07/13/2021 Target Resolution Date: 07/27/2021 Goal Status: Met Interventions: Assess patient/caregiver ability to obtain necessary supplies Assess patient/caregiver ability to perform ulcer/skin care regimen upon admission and as needed Assess ulceration(s) every visit Provide education on ulcer and skin care Treatment Activities: Skin care regimen initiated : 02/13/2021 Topical wound management initiated : 02/13/2021 Notes: 06/29/21: Wounds 3 and 6 only at 80% volume reduction. Target date extended. Electronic Signature(s) Signed: 08/10/2021 5:42:26 PM By: Lorrin Jackson Entered By: Lorrin Jackson on 08/10/2021 10:25:35 -------------------------------------------------------------------------------- Pain Assessment Details Patient Name: Date of Service: MARTHANN, ABSHIER. 08/10/2021 10:15 A M Medical Record Number: [id number] Patient Account Number: [id number] Date of Birth/Sex: Treating RN: Jan 09, 1996 (25 y.o. Sue Lush Primary Care Kaydence Menard: [name] Other Clinician: Referring Chelcey Caputo: Treating Ellison Leisure/Extender: Elsworth Soho in Treatment: 25 Active Problems Location of Pain Severity and Description of Pain Patient Has Paino No Site Locations Pain Management and Medication Current Pain Management: Electronic Signature(s) Signed: 08/10/2021 5:42:26 PM By: Lorrin Jackson Entered By: Lorrin Jackson on 08/10/2021 10:29:45 -------------------------------------------------------------------------------- Patient/Caregiver Education  Details Patient Name: Date of Service: Kelly Setters. 12/15/2022andnbsp10:15 A M Medical Record Number: [id number] Patient Account Number:  [id number] Date of Birth/Gender: Treating RN: 1996-01-14 (25 y.o. Sue Lush Primary Care Physician: [name] Other Clinician: Referring Physician: Treating Physician/Extender: Elsworth Soho in Treatment: 25 Education Assessment Education Provided To: Patient Education Topics Provided Wound/Skin Impairment: Methods: Demonstration, Explain/Verbal, Printed Responses: State content correctly [employer]) Signed: 08/10/2021 5:42:26 PM By: Lorrin Jackson Entered By: Lorrin Jackson on 08/10/2021 10:26:05 -------------------------------------------------------------------------------- Wound Assessment Details Patient Name: Date of Service: Kelly Lou [place]. 08/10/2021 10:15 A M Medical Record Number: [id number] Patient Account Number: [id number] Date of Birth/Sex: Treating RN: 07/20/1996 (25 y.o. Sue Lush Primary Care Mirel Hundal: [name] Other Clinician: Referring Lorma Heater: Treating Aamya Orellana/Extender: Elsworth Soho in Treatment: 25 Wound Status Wound Number: 4 Primary Etiology: Cellulitis Wound Location: Left, Lateral Ankle Secondary Etiology: Lupus Wounding Event: Gradually Appeared Wound Status: Open Date Acquired: 04/14/2021 Comorbid History: Asthma, Lupus Erythematosus Weeks Of Treatment: 15 Clustered Wound: No Photos Photo Uploaded By: Donavan Burnet on 08/10/2021 15:50:21 Wound Measurements Length: (cm) 2.4 Width: (cm) 3.1 Depth: (cm) 0.1 Area: (cm) 5.843 Volume: (cm) 0.584 % Reduction in Area: 62.8% % Reduction in Volume: 90.7% Epithelialization: Medium (34-66%) Tunneling: No Undermining: No Wound Description Classification: Full Thickness Without Exposed Support Structures Wound Margin: Distinct, outline  attached Exudate Amount: Medium Exudate Type: Serosanguineous Exudate Color: red, brown Foul Odor After Cleansing: No Slough/Fibrino Yes Wound Bed Granulation Amount: Large (67-100%) Exposed Structure Granulation Quality: Pink Fascia Exposed: No Necrotic Amount: Small (1-33%) Fat Layer (Subcutaneous Tissue) Exposed: Yes Necrotic Quality: Adherent Slough Tendon Exposed: No Muscle Exposed: No Joint Exposed: No Bone Exposed: No Treatment Notes Wound #4 (Ankle) Wound Laterality: Left, Lateral Cleanser Soap and Water Discharge Instruction: May shower and wash wound with dial antibacterial soap and water prior to dressing change. Peri-Wound Care Topical Primary Dressing Santyl Ointment Discharge Instruction: Apply nickel thick amount to wound bed as instructed Secondary Dressing Woven Gauze Sponge, Non-Sterile 4x4 in Discharge Instruction: Apply over primary dressing as directed. Secured With [employer], 4.5x3.1 (in/yd) Discharge Instruction: Secure with Kerlix as directed. Paper Tape, 2x10 (in/yd) Discharge Instruction: Secure dressing with tape as directed. Compression Wrap Compression Stockings Add-Ons Electronic Signature(s) Signed: 08/10/2021 5:42:26 PM By: Lorrin Jackson Entered By: Lorrin Jackson on 08/10/2021 10:33:44 -------------------------------------------------------------------------------- Wound Assessment Details Patient Name: Date of Service: Kelly Lou [place]. 08/10/2021 10:15 A M Medical Record Number: [id number] Patient Account Number: [id number] Date of Birth/Sex: Treating RN: [birth year] (25 y.o. Sue Lush Primary Care Emylee Decelle: [name] Other Clinician: Referring Kiera Hussey: Treating Vallie Teters/Extender: Elsworth Soho in Treatment: 25 Wound Status Wound Number: 5 Primary Etiology: Cellulitis Wound Location: Left, Anterior Ankle Secondary Etiology: Lupus Wounding Event: Gradually  Appeared Wound Status: Open Date Acquired: 04/14/2021 Comorbid History: Asthma, Lupus Erythematosus Weeks Of Treatment: 15 Clustered Wound: No Photos Photo Uploaded By: Donavan Burnet on 08/10/2021 15:48:29 Wound Measurements Length: (cm) 1.5 Width: (cm) 0.7 Depth: (cm) 0.2 Area: (cm) 0.825 Volume: (cm) 0.165 % Reduction in Area: 12.4% % Reduction in Volume: 12.2% Epithelialization: None Tunneling: No Undermining: No Wound Description Classification: Full Thickness Without Exposed Support Structures Wound Margin: Distinct, outline attached Exudate Amount: Medium Exudate Type: Serosanguineous Exudate Color: red, brown Foul Odor After Cleansing: No Slough/Fibrino Yes Wound Bed Granulation Amount: Medium (34-66%) Exposed Structure Granulation Quality: Red, Pink Fascia Exposed: No Necrotic Amount: Medium (34-66%) Fat Layer (Subcutaneous Tissue) Exposed: Yes Necrotic Quality: Adherent Slough Tendon Exposed: No Muscle Exposed: No Joint Exposed: No Bone Exposed: No Treatment Notes Wound #5 (Ankle)  Wound Laterality: Left, Anterior Cleanser Soap and Water Discharge Instruction: May shower and wash wound with dial antibacterial soap and water prior to dressing change. Peri-Wound Care Topical Primary Dressing Promogran Prisma Matrix, 4.34 (sq in) (silver collagen) Discharge Instruction: Moisten collagen with saline or hydrogel Secondary Dressing Woven Gauze Sponge, Non-Sterile 4x4 in Discharge Instruction: Apply over primary dressing as directed. Secured With [employer], 4.5x3.1 (in/yd) Discharge Instruction: Secure with Kerlix as directed. Paper Tape, 2x10 (in/yd) Discharge Instruction: Secure dressing with tape as directed. Compression Wrap Compression Stockings Add-Ons Electronic Signature(s) Signed: 08/10/2021 5:42:26 PM By: Lorrin Jackson Entered By: Lorrin Jackson on 08/10/2021  10:34:52 -------------------------------------------------------------------------------- Wound Assessment Details Patient Name: Date of Service: Kelly Lou [place]. 08/10/2021 10:15 A M Medical Record Number: [id number] Patient Account Number: [id number] Date of Birth/Sex: Treating RN: 1995/12/07 (25 y.o. Sue Lush Primary Care Macon Lesesne: [name] Other Clinician: Referring Ketura Sirek: Treating [name] Dennington/Extender: Elsworth Soho in Treatment: 25 Wound Status Wound Number: 7 Primary Etiology: Lupus Wound Location: Left, Anterior Lower Leg Wound Status: Open Wounding Event: Other Lesion Comorbid History: Asthma, Lupus Erythematosus Date Acquired: 05/18/2021 Weeks Of Treatment: 12 Clustered Wound: No Photos Photo Uploaded By: Donavan Burnet on 08/10/2021 15:48:30 Wound Measurements Length: (cm) 0.7 Width: (cm) 0.7 Depth: (cm) 0.2 Area: (cm) 0.385 Volume: (cm) 0.077 % Reduction in Area: 68.6% % Reduction in Volume: 79.1% Epithelialization: Medium (34-66%) Tunneling: No Undermining: No Wound Description Classification: Full Thickness Without Exposed Support Structures Wound Margin: Well defined, not attached Exudate Amount: Medium Exudate Type: Serosanguineous Exudate Color: red, brown Foul Odor After Cleansing: No Slough/Fibrino Yes Wound Bed Granulation Amount: Large (67-100%) Exposed Structure Granulation Quality: Red, Pink Fascia Exposed: No Necrotic Amount: Small (1-33%) Fat Layer (Subcutaneous Tissue) Exposed: Yes Necrotic Quality: Adherent Slough Tendon Exposed: No Muscle Exposed: No Joint Exposed: No Bone Exposed: No Treatment Notes Wound #7 (Lower Leg) Wound Laterality: Left, Anterior Cleanser Soap and Water Discharge Instruction: May shower and wash wound with dial antibacterial soap and water prior to dressing change. Peri-Wound Care Topical Primary Dressing Promogran Prisma Matrix, 4.34 (sq in) (silver  collagen) Discharge Instruction: Moisten collagen with saline or hydrogel Secondary Dressing Woven Gauze Sponge, Non-Sterile 4x4 in Discharge Instruction: Apply over primary dressing as directed. Secured With [employer], 4.5x3.1 (in/yd) Discharge Instruction: Secure with Kerlix as directed. Paper Tape, 2x10 (in/yd) Discharge Instruction: Secure dressing with tape as directed. Compression Wrap Compression Stockings Add-Ons Electronic Signature(s) Signed: 08/10/2021 5:42:26 PM By: Lorrin Jackson Entered By: Lorrin Jackson on 08/10/2021 10:35:49 -------------------------------------------------------------------------------- Vitals Details Patient Name: Date of Service: Kelly Lou [name] 08/10/2021 10:15 A M Medical Record Number: [id number] Patient Account Number: [id number] Date of Birth/Sex: Treating RN: 05-29-1996 (25 y.o. Sue Lush Primary Care Otoniel Myhand: [name] Other Clinician: Referring Torrin Frein: Treating Rande Roylance/Extender: Elsworth Soho in Treatment: 25 Vital Signs Time Taken: 10:28 Temperature (F): 98.9 Height (in): 62 Pulse (bpm): 93 Respiratory Rate (breaths/min): 18 Blood Pressure (mmHg): 123/82 Reference Range: 80 - 120 mg / dl Electronic Signature(s) Signed: 08/10/2021 5:42:26 PM By: Lorrin Jackson Entered By: Lorrin Jackson on 08/10/2021 10:29:11

## 2021-08-10 NOTE — Progress Notes (Signed)
Kelly, [name] ([id number]) Visit Report for 08/10/2021 Chief Complaint Document Details Patient Name: Date of Service: Kelly, [name]. 08/10/2021 10:15 A M Medical Record Number: [id number] Patient Account Number: [id number] Date of Birth/Sex: Treating RN: [birth year] (25 y.o. F) Primary Care Provider: Dione [name] Other Clinician: Referring Provider: Treating Provider/Extender: Kelly [name] in Treatment: 25 Information Obtained from: Patient Chief Complaint Bilateral lower extremity wounds 04/21/21: left lower extremity wound s/p cellulitis Electronic Signature(s) Signed: 08/10/2021 10:55:22 AM By: Kelly Corwin DO Entered By: Kelly [name] on 08/10/2021 10:51:00 -------------------------------------------------------------------------------- Debridement Details Patient Name: Date of Service: Kelly [name] [name] 08/10/2021 10:15 A M Medical Record Number: [id number] Patient Account Number: [id number] Date of Birth/Sex: Treating RN: 1996/07/02 (25 y.o. Kelly [name] Primary Care Provider: Dione [name] Other Clinician: Referring Provider: Treating Provider/Extender: Kelly [name] in Treatment: 25 Debridement Performed for Assessment: Wound #5 Left,Anterior Ankle Performed By: Physician Kelly Corwin, DO Debridement Type: Debridement Level of Consciousness (Pre-procedure): Awake and Alert Pre-procedure Verification/Time Out Yes - 10:39 Taken: Start Time: 10:40 Pain Control: Other : Benzocaine 20% T Area Debrided (L x W): otal 1.5 (cm) x 0.7 (cm) = 1.05 (cm) Tissue and other material debrided: Non-Viable, Slough, Subcutaneous, Slough Level: Skin/Subcutaneous Tissue Debridement Description: Excisional Instrument: Curette Bleeding: Minimum Hemostasis Achieved: Pressure End Time: 10:42 Response to Treatment: Procedure was tolerated well Level of Consciousness (Post- Awake and  Alert procedure): Post Debridement Measurements of Total Wound Length: (cm) 1.5 Width: (cm) 0.7 Depth: (cm) 0.2 Volume: (cm) 0.165 Character of Wound/Ulcer Post Debridement: Stable Post Procedure Diagnosis Same as Pre-procedure Electronic Signature(s) Signed: 08/10/2021 10:55:22 AM By: Kelly Corwin DO Signed: 08/10/2021 5:42:26 PM By: Kelly [name] Entered By: Kelly [name] on 08/10/2021 10:43:49 -------------------------------------------------------------------------------- Debridement Details Patient Name: Date of Service: Kelly [name] [name] 08/10/2021 10:15 A M Medical Record Number: [id number] Patient Account Number: [id number] Date of Birth/Sex: Treating RN: Jan 13, 1996 (25 y.o. Kelly [name] Primary Care Provider: Dione [name] Other Clinician: Referring Provider: Treating Provider/Extender: Kelly [name] in Treatment: 25 Debridement Performed for Assessment: Wound #7 Left,Anterior Lower Leg Performed By: Physician Kelly Corwin, DO Debridement Type: Debridement Level of Consciousness (Pre-procedure): Awake and Alert Pre-procedure Verification/Time Out Yes - 10:39 Taken: Start Time: 10:42 Pain Control: Other : Benzocaine 20% T Area Debrided (L x W): otal 0.7 (cm) x 0.7 (cm) = 0.49 (cm) Tissue and other material debrided: Non-Viable, Slough, Subcutaneous, Slough Level: Skin/Subcutaneous Tissue Debridement Description: Excisional Instrument: Curette Bleeding: Minimum Hemostasis Achieved: Pressure End Time: 10:44 Response to Treatment: Procedure was tolerated well Level of Consciousness (Post- Awake and Alert procedure): Post Debridement Measurements of Total Wound Length: (cm) 0.7 Width: (cm) 0.7 Depth: (cm) 0.2 Volume: (cm) 0.077 Character of Wound/Ulcer Post Debridement: Stable Post Procedure Diagnosis Same as Pre-procedure Electronic Signature(s) Signed: 08/10/2021 10:55:22 AM By: Kelly Corwin  DO Signed: 08/10/2021 5:42:26 PM By: Kelly [name] Entered By: Kelly [name] on 08/10/2021 10:44:43 -------------------------------------------------------------------------------- Debridement Details Patient Name: Date of Service: Kelly [name] [name] 08/10/2021 10:15 A M Medical Record Number: [id number] Patient Account Number: [id number] Date of Birth/Sex: Treating RN: 10/28/1995 (25 y.o. Kelly [name] Primary Care Provider: Dione [name] Other Clinician: Referring Provider: Treating Provider/Extender: Kelly [name] in Treatment: 25 Debridement Performed for Assessment: Wound #4 Left,Lateral Ankle Performed By: Physician Kelly Corwin, DO Debridement Type: Debridement Level of Consciousness (Pre-procedure): Awake and Alert Pre-procedure Verification/Time Out Yes - 10:39 Taken: Start Time: 10:44 Pain Control: Other :  Benzocaine 20% T Area Debrided (L x W): otal 2.4 (cm) x 3.1 (cm) = 7.44 (cm) Tissue and other material debrided: Non-Viable, Slough, Subcutaneous, Slough Level: Skin/Subcutaneous Tissue Debridement Description: Excisional Instrument: Curette Bleeding: Minimum Hemostasis Achieved: Pressure End Time: 10:46 Response to Treatment: Procedure was tolerated well Level of Consciousness (Post- Awake and Alert procedure): Post Debridement Measurements of Total Wound Length: (cm) 2.4 Width: (cm) 3.1 Depth: (cm) 0.1 Volume: (cm) 0.584 Character of Wound/Ulcer Post Debridement: Stable Post Procedure Diagnosis Same as Pre-procedure Electronic Signature(s) Signed: 08/10/2021 10:55:22 AM By: Kelly Corwin DO Signed: 08/10/2021 5:42:26 PM By: Kelly [name] Entered By: Kelly [name] on 08/10/2021 10:46:16 -------------------------------------------------------------------------------- HPI Details Patient Name: Date of Service: Kelly [name] [name] 08/10/2021 10:15 A M Medical Record Number: [id number] Patient Account  Number: [id number] Date of Birth/Sex: Treating RN: 1996-05-26 (25 y.o. F) Primary Care Provider: Dione [name] Other Clinician: Referring Provider: Treating Provider/Extender: Kelly [name] in Treatment: 25 History of Present Illness HPI Description: Admission 6/21 [name] is A 25 year old female with a past medical history of systemic lupus erythematosus that presents with bilateral lower extremity wounds that started in April 2022. She has had skin issues in the past where she developed very small wounds but these healed with time. She has never had open wounds like she does on her legs. She currently reports minimal drainage to the wounds. She does have pain to these areas but has been overall stable. She denies signs of infection. She has visited the ED for this issue and was recently prescribed doxycycline for possible cellulitis. She follows with [facility] for her SLE and is currently on Plaquenil, prednisone and CellCept. 6/27; patient presents for 1 week follow-up. She states she saw her rheumatologist on 6/22 and prednisone was increased from 10 mg to 20 mg daily. She is currently at the highest dose of Plaquenil and CellCept and is going to try an infusion later today at her regimen. She reports improvement to the wounds. She has been using collagen with dressing changes. She currently denies signs of infection. Readmission 8/26 Patient presents to clinic today for open wounds to her left lower extremity. Her previous wounds that were treated have closed. She states that on 8/9 she developed cellulitis and was treated with 2 rounds of antibiotics. Her cellulitis has resolved. She had blisters that opened and are now large wounds. She has seen her dermatologist and rheumatologist since she has systemic lupus and a hard time healing. No changes have been made to her medications. She was recommended antibiotic ointment by her  dermatologist to her wounds She picked up the antibiotic ointment today.. She reports pain to these areas. She denies purulent drainage, increased warmth or erythema to the left lower extremity. She currently keeps the areas covered. Of note she had an Achilles tendon rupture on her right leg and is currently in a soft cast. 9/13; patient presents for follow-up. She missed her last clinic appointment. She has been using Santyl to the wound beds daily. She has no issues or complaints today. She reports the Santyl is helping clean up the wounds. She currently denies signs of infection. 9/22; patient states that 1 week ago she was seen by dermatology and they placed an Unna boot with Bactroban on her left lower extremity. Today she presents with a Unna boot in place. She currently denies signs of infection. 10/7; patient presents for follow-up. She reports improvement. She has been using Santyl to the areas of  nonviable tissue and collagen to granulation tissue. She has been approved for infusions for her lupus. She is going to start these soon. These will be injection she can do at home. She currently denies signs of infection. 10/21; patient presents for follow-up. She reports continued improvement to her wound healing. She has been using Santyl to all areas except for the superior posterior left leg wound and she is using collagen to this. She has started her injection therapy for lupus. She started 1 week ago. She currently denies signs of infection. 11/3; patient presents for follow-up. She has no issues or complaints today. She is on her third week of injection therapy for lupus and tolerating this well. She has no issues or complaints today. She denies signs of infection. 11/17; patient presents for 2-week follow-up. She reports improvement in wound healing. She denies signs of infection. 12/1; patient presents for follow-up. She has been using collagen to the anterior left lower extremity wounds  and Santyl to the left lateral malleolus. She has no issues or complaints today. 12/15; patient presents for follow-up. She is using collagen to the 2 anterior left leg wounds and Santyl to the left lateral malleolus wound. She reports improvement in wound healing. She denies signs of infection. Electronic Signature(s) Signed: 08/10/2021 10:55:22 AM By: Kelly Corwin DO Entered By: Kelly [name] on 08/10/2021 10:51:49 -------------------------------------------------------------------------------- Physical Exam Details Patient Name: Date of Service: Kelly [name]. 08/10/2021 10:15 A M Medical Record Number: [id number] Patient Account Number: [id number] Date of Birth/Sex: Treating RN: 1996/08/26 (25 y.o. F) Primary Care Provider: Dione [name] Other Clinician: Referring Provider: Treating Provider/Extender: Kelly [name] in Treatment: 25 Constitutional respirations regular, non-labored and within target range for patient.. Cardiovascular 2+ dorsalis pedis/posterior tibialis pulses. Psychiatric pleasant and cooperative. Notes Left lower extremity: 3 wounds present. 2 wounds to the anterior lower leg with granulation tissue and scant nonviable tissue. The left lateral ankle has granulation tissue also with scant nonviable tissue. No signs of infection. Wounds appear well-healing. Electronic Signature(s) Signed: 08/10/2021 10:55:22 AM By: Kelly Corwin DO Entered By: Kelly [name] on 08/10/2021 10:52:42 -------------------------------------------------------------------------------- Physician Orders Details Patient Name: Date of Service: Kelly [name] [name] 08/10/2021 10:15 A M Medical Record Number: [id number] Patient Account Number: [id number] Date of Birth/Sex: Treating RN: November 06, 1995 (25 y.o. Kelly [name] Primary Care Provider: Dione [name] Other Clinician: Referring Provider: Treating Provider/Extender: Kelly [name] in Treatment: 25 Verbal / Phone Orders: No Diagnosis Coding ICD-10 Coding Code Description [phone] Non-pressure chronic ulcer of other part of left lower leg with fat layer exposed L97.812 Non-pressure chronic ulcer of other part of right lower leg with fat layer exposed M32.9 Systemic lupus erythematosus, unspecified Follow-up Appointments ppointment in 2 weeks. - Dr. Mikey Bussing Return A Bathing/ Shower/ Hygiene May shower and wash wound with soap and water. Edema Control - Lymphedema / SCD / Other Elevate legs to the level of the heart or above for 30 minutes daily and/or when sitting, a frequency of: - 3-4 times a day throughout the day. Avoid standing for long periods of time. Additional Orders / Instructions Follow Nutritious Diet Wound Treatment Wound #4 - Ankle Wound Laterality: Left, Lateral Cleanser: Soap and Water 1 x Per Day/30 Days Discharge Instructions: May shower and wash wound with dial antibacterial soap and water prior to dressing change. Prim Dressing: Santyl Ointment 1 x Per Day/30 Days ary Discharge Instructions: Apply nickel thick amount to wound bed as instructed Secondary Dressing: Woven  Gauze Sponge, Non-Sterile 4x4 in (Generic) 1 x Per Day/30 Days Discharge Instructions: Apply over primary dressing as directed. Secured With: [employer], 4.5x3.1 (in/yd) (Generic) 1 x Per Day/30 Days Discharge Instructions: Secure with Kerlix as directed. Secured With: Paper Tape, 2x10 (in/yd) (Generic) 1 x Per Day/30 Days Discharge Instructions: Secure dressing with tape as directed. Wound #5 - Ankle Wound Laterality: Left, Anterior Cleanser: Soap and Water 1 x Per Day/30 Days Discharge Instructions: May shower and wash wound with dial antibacterial soap and water prior to dressing change. Prim Dressing: Promogran Prisma Matrix, 4.34 (sq in) (silver collagen) 1 x Per Day/30 Days ary Discharge Instructions: Moisten collagen with saline  or hydrogel Secondary Dressing: Woven Gauze Sponge, Non-Sterile 4x4 in (Generic) 1 x Per Day/30 Days Discharge Instructions: Apply over primary dressing as directed. Secured With: [employer], 4.5x3.1 (in/yd) (Generic) 1 x Per Day/30 Days Discharge Instructions: Secure with Kerlix as directed. Secured With: Paper Tape, 2x10 (in/yd) (Generic) 1 x Per Day/30 Days Discharge Instructions: Secure dressing with tape as directed. Wound #7 - Lower Leg Wound Laterality: Left, Anterior Cleanser: Soap and Water 1 x Per Day/30 Days Discharge Instructions: May shower and wash wound with dial antibacterial soap and water prior to dressing change. Prim Dressing: Promogran Prisma Matrix, 4.34 (sq in) (silver collagen) 1 x Per Day/30 Days ary Discharge Instructions: Moisten collagen with saline or hydrogel Secondary Dressing: Woven Gauze Sponge, Non-Sterile 4x4 in (Generic) 1 x Per Day/30 Days Discharge Instructions: Apply over primary dressing as directed. Secured With: [employer], 4.5x3.1 (in/yd) (Generic) 1 x Per Day/30 Days Discharge Instructions: Secure with Kerlix as directed. Secured With: Paper Tape, 2x10 (in/yd) (Generic) 1 x Per Day/30 Days Discharge Instructions: Secure dressing with tape as directed. Electronic Signature(s) Signed: 08/10/2021 10:55:22 AM By: Kelly Corwin DO Entered By: Kelly [name] on 08/10/2021 10:53:47 -------------------------------------------------------------------------------- Problem List Details Patient Name: Date of Service: Kelly [name] [name] 08/10/2021 10:15 A M Medical Record Number: [id number] Patient Account Number: [id number] Date of Birth/Sex: Treating RN: 03/29/1996 (25 y.o. Kelly [name] Primary Care Provider: Dione [name] Other Clinician: Referring Provider: Treating Provider/Extender: Kelly [name] in Treatment: 25 Active Problems ICD-10 Encounter Code Description Active Date  MDM Diagnosis [phone] Non-pressure chronic ulcer of other part of left lower leg with fat layer exposed11/10/2020 No Yes L97.812 Non-pressure chronic ulcer of other part of right lower leg with fat layer 06/29/2021 No Yes exposed M32.9 Systemic lupus erythematosus, unspecified 02/14/2021 No Yes Inactive Problems ICD-10 Code Description Active Date Inactive Date L97.819 Non-pressure chronic ulcer of other part of right lower leg with unspecified severity 02/13/2021 02/13/2021 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 with unspecified severity 02/13/2021 02/13/2021 B14.782N Unspecified open wound, left lower leg, initial encounter 04/21/2021 04/21/2021 S91.002A Unspecified open wound, left ankle, initial encounter 04/21/2021 04/21/2021 Resolved Problems Electronic Signature(s) Signed: 08/10/2021 10:55:22 AM By: Kelly Corwin DO Entered By: Kelly [name] on 08/10/2021 10:50:02 -------------------------------------------------------------------------------- Progress Note Details Patient Name: Date of Service: Kelly [name] [name] 08/10/2021 10:15 A M Medical Record Number: [id number] Patient Account Number: [id number] Date of Birth/Sex: Treating RN: 05/30/1996 (25 y.o. F) Primary Care Provider: Dione [name] Other Clinician: Referring Provider: Treating Provider/Extender: Kelly [name] in Treatment: 25 Subjective Chief Complaint Information obtained from Patient Bilateral lower extremity wounds 04/21/21: left lower extremity wound s/p cellulitis History of Present Illness (HPI) Admission 6/21 Ms. Ralynn San is A 25 year old female with a past medical history  of systemic lupus erythematosus that presents with bilateral lower extremity wounds that started in April 2022. She has had skin issues in the past where she developed very small wounds but these healed with time. She has never had open wounds like she does on her legs. She  currently reports minimal drainage to the wounds. She does have pain to these areas but has been overall stable. She denies signs of infection. She has visited the ED for this issue and was recently prescribed doxycycline for possible cellulitis. She follows with [facility] for her SLE and is currently on Plaquenil, prednisone and CellCept. 6/27; patient presents for 1 week follow-up. She states she saw her rheumatologist on 6/22 and prednisone was increased from 10 mg to 20 mg daily. She is currently at the highest dose of Plaquenil and CellCept and is going to try an infusion later today at her regimen. She reports improvement to the wounds. She has been using collagen with dressing changes. She currently denies signs of infection. Readmission 8/26 Patient presents to clinic today for open wounds to her left lower extremity. Her previous wounds that were treated have closed. She states that on 8/9 she developed cellulitis and was treated with 2 rounds of antibiotics. Her cellulitis has resolved. She had blisters that opened and are now large wounds. She has seen her dermatologist and rheumatologist since she has systemic lupus and a hard time healing. No changes have been made to her medications. She was recommended antibiotic ointment by her dermatologist to her wounds She picked up the antibiotic ointment today.. She reports pain to these areas. She denies purulent drainage, increased warmth or erythema to the left lower extremity. She currently keeps the areas covered. Of note she had an Achilles tendon rupture on her right leg and is currently in a soft cast. 9/13; patient presents for follow-up. She missed her last clinic appointment. She has been using Santyl to the wound beds daily. She has no issues or complaints today. She reports the Santyl is helping clean up the wounds. She currently denies signs of infection. 9/22; patient states that 1 week ago she was seen by dermatology and  they placed an Unna boot with Bactroban on her left lower extremity. Today she presents with a Unna boot in place. She currently denies signs of infection. 10/7; patient presents for follow-up. She reports improvement. She has been using Santyl to the areas of nonviable tissue and collagen to granulation tissue. She has been approved for infusions for her lupus. She is going to start these soon. These will be injection she can do at home. She currently denies signs of infection. 10/21; patient presents for follow-up. She reports continued improvement to her wound healing. She has been using Santyl to all areas except for the superior posterior left leg wound and she is using collagen to this. She has started her injection therapy for lupus. She started 1 week ago. She currently denies signs of infection. 11/3; patient presents for follow-up. She has no issues or complaints today. She is on her third week of injection therapy for lupus and tolerating this well. She has no issues or complaints today. She denies signs of infection. 11/17; patient presents for 2-week follow-up. She reports improvement in wound healing. She denies signs of infection. 12/1; patient presents for follow-up. She has been using collagen to the anterior left lower extremity wounds and Santyl to the left lateral malleolus. She has no issues or complaints today. 12/15; patient presents for  follow-up. She is using collagen to the 2 anterior left leg wounds and Santyl to the left lateral malleolus wound. She reports improvement in wound healing. She denies signs of infection. Patient History Information obtained from Patient. Family History Diabetes - Paternal Grandparents, Hypertension - Paternal Grandparents, Thyroid Problems - Maternal Grandparents,Paternal Grandparents, No family history of Cancer, Heart Disease, Hereditary Spherocytosis, Kidney Disease, Lung Disease, Seizures, Stroke, Tuberculosis. Social History Never  smoker, Marital Status - Married, Alcohol Use - Moderate, Drug Use - Current History - Marijuana, Caffeine Use - Daily - Coffee. Medical History Respiratory Patient has history of Asthma Immunological Patient has history of Lupus Erythematosus Objective Constitutional respirations regular, non-labored and within target range for patient.. Vitals Time Taken: 10:28 AM, Height: 62 in, Temperature: 98.9 F, Pulse: 93 bpm, Respiratory Rate: 18 breaths/min, Blood Pressure: 123/82 mmHg. Cardiovascular 2+ dorsalis pedis/posterior tibialis pulses. Psychiatric pleasant and cooperative. General Notes: Left lower extremity: 3 wounds present. 2 wounds to the anterior lower leg with granulation tissue and scant nonviable tissue. The left lateral ankle has granulation tissue also with scant nonviable tissue. No signs of infection. Wounds appear well-healing. Integumentary (Hair, Skin) Wound #4 status is Open. Original cause of wound was Gradually Appeared. The date acquired was: 04/14/2021. The wound has been in treatment 15 weeks. The wound is located on the Left,Lateral Ankle. The wound measures 2.4cm length x 3.1cm width x 0.1cm depth; 5.843cm^2 area and 0.584cm^3 volume. There is Fat Layer (Subcutaneous Tissue) exposed. There is no tunneling or undermining noted. There is a medium amount of serosanguineous drainage noted. The wound margin is distinct with the outline attached to the wound base. There is large (67-100%) pink granulation within the wound bed. There is a small (1-33%) amount of necrotic tissue within the wound bed including Adherent Slough. Wound #5 status is Open. Original cause of wound was Gradually Appeared. The date acquired was: 04/14/2021. The wound has been in treatment 15 weeks. The wound is located on the Left,Anterior Ankle. The wound measures 1.5cm length x 0.7cm width x 0.2cm depth; 0.825cm^2 area and 0.165cm^3 volume. There is Fat Layer (Subcutaneous Tissue) exposed. There is  no tunneling or undermining noted. There is a medium amount of serosanguineous drainage noted. The wound margin is distinct with the outline attached to the wound base. There is medium (34-66%) red, pink granulation within the wound bed. There is a medium (34-66%) amount of necrotic tissue within the wound bed including Adherent Slough. Wound #7 status is Open. Original cause of wound was Other Lesion. The date acquired was: 05/18/2021. The wound has been in treatment 12 weeks. The wound is located on the Left,Anterior Lower Leg. The wound measures 0.7cm length x 0.7cm width x 0.2cm depth; 0.385cm^2 area and 0.077cm^3 volume. There is Fat Layer (Subcutaneous Tissue) exposed. There is no tunneling or undermining noted. There is a medium amount of serosanguineous drainage noted. The wound margin is well defined and not attached to the wound base. There is large (67-100%) red, pink granulation within the wound bed. There is a small (1- 33%) amount of necrotic tissue within the wound bed including Adherent Slough. Assessment Active Problems ICD-10 Non-pressure chronic ulcer of other part of left lower leg with fat layer exposed Non-pressure chronic ulcer of other part of right lower leg with fat layer exposed Systemic lupus erythematosus, unspecified Patient's wounds have shown improvement in size and appearance since last clinic visit. No signs of infection on exam. I debrided nonviable tissue. I recommended continuing collagen to the  anterior left leg wounds and Santyl to the left lateral malleolus. Follow up in 2 weeks Procedures Wound #4 Pre-procedure diagnosis of Wound #4 is a Cellulitis located on the Left,Lateral Ankle . There was a Excisional Skin/Subcutaneous Tissue Debridement with a total area of 7.44 sq cm performed by Kelly Corwin, DO. With the following instrument(s): Curette to remove Non-Viable tissue/material. Material removed includes Subcutaneous Tissue and Slough and after  achieving pain control using Other (Benzocaine 20%). No specimens were taken. A time out was conducted at 10:39, prior to the start of the procedure. A Minimum amount of bleeding was controlled with Pressure. The procedure was tolerated well. Post Debridement Measurements: 2.4cm length x 3.1cm width x 0.1cm depth; 0.584cm^3 volume. Character of Wound/Ulcer Post Debridement is stable. Post procedure Diagnosis Wound #4: Same as Pre-Procedure Wound #5 Pre-procedure diagnosis of Wound #5 is a Cellulitis located on the Left,Anterior Ankle . There was a Excisional Skin/Subcutaneous Tissue Debridement with a total area of 1.05 sq cm performed by Kelly Corwin, DO. With the following instrument(s): Curette to remove Non-Viable tissue/material. Material removed includes Subcutaneous Tissue and Slough and after achieving pain control using Other (Benzocaine 20%). No specimens were taken. A time out was conducted at 10:39, prior to the start of the procedure. A Minimum amount of bleeding was controlled with Pressure. The procedure was tolerated well. Post Debridement Measurements: 1.5cm length x 0.7cm width x 0.2cm depth; 0.165cm^3 volume. Character of Wound/Ulcer Post Debridement is stable. Post procedure Diagnosis Wound #5: Same as Pre-Procedure Wound #7 Pre-procedure diagnosis of Wound #7 is a Lupus located on the Left,Anterior Lower Leg . There was a Excisional Skin/Subcutaneous Tissue Debridement with a total area of 0.49 sq cm performed by Kelly Corwin, DO. With the following instrument(s): Curette to remove Non-Viable tissue/material. Material removed includes Subcutaneous Tissue and Slough and after achieving pain control using Other (Benzocaine 20%). No specimens were taken. A time out was conducted at 10:39, prior to the start of the procedure. A Minimum amount of bleeding was controlled with Pressure. The procedure was tolerated well. Post Debridement Measurements: 0.7cm length x 0.7cm width  x 0.2cm depth; 0.077cm^3 volume. Character of Wound/Ulcer Post Debridement is stable. Post procedure Diagnosis Wound #7: Same as Pre-Procedure Plan Follow-up Appointments: Return Appointment in 2 weeks. - Dr. Mikey Bussing Bathing/ Shower/ Hygiene: May shower and wash wound with soap and water. Edema Control - Lymphedema / SCD / Other: Elevate legs to the level of the heart or above for 30 minutes daily and/or when sitting, a frequency of: - 3-4 times a day throughout the day. Avoid standing for long periods of time. Additional Orders / Instructions: Follow Nutritious Diet WOUND #4: - Ankle Wound Laterality: Left, Lateral Cleanser: Soap and Water 1 x Per Day/30 Days Discharge Instructions: May shower and wash wound with dial antibacterial soap and water prior to dressing change. Prim Dressing: Santyl Ointment 1 x Per Day/30 Days ary Discharge Instructions: Apply nickel thick amount to wound bed as instructed Secondary Dressing: Woven Gauze Sponge, Non-Sterile 4x4 in (Generic) 1 x Per Day/30 Days Discharge Instructions: Apply over primary dressing as directed. Secured With: [employer], 4.5x3.1 (in/yd) (Generic) 1 x Per Day/30 Days Discharge Instructions: Secure with Kerlix as directed. Secured With: Paper T ape, 2x10 (in/yd) (Generic) 1 x Per Day/30 Days Discharge Instructions: Secure dressing with tape as directed. WOUND #5: - Ankle Wound Laterality: Left, Anterior Cleanser: Soap and Water 1 x Per Day/30 Days Discharge Instructions: May shower and wash wound with dial  antibacterial soap and water prior to dressing change. Prim Dressing: Promogran Prisma Matrix, 4.34 (sq in) (silver collagen) 1 x Per Day/30 Days ary Discharge Instructions: Moisten collagen with saline or hydrogel Secondary Dressing: Woven Gauze Sponge, Non-Sterile 4x4 in (Generic) 1 x Per Day/30 Days Discharge Instructions: Apply over primary dressing as directed. Secured With: [employer], 4.5x3.1 (in/yd)  (Generic) 1 x Per Day/30 Days Discharge Instructions: Secure with Kerlix as directed. Secured With: Paper T ape, 2x10 (in/yd) (Generic) 1 x Per Day/30 Days Discharge Instructions: Secure dressing with tape as directed. WOUND #7: - Lower Leg Wound Laterality: Left, Anterior Cleanser: Soap and Water 1 x Per Day/30 Days Discharge Instructions: May shower and wash wound with dial antibacterial soap and water prior to dressing change. Prim Dressing: Promogran Prisma Matrix, 4.34 (sq in) (silver collagen) 1 x Per Day/30 Days ary Discharge Instructions: Moisten collagen with saline or hydrogel Secondary Dressing: Woven Gauze Sponge, Non-Sterile 4x4 in (Generic) 1 x Per Day/30 Days Discharge Instructions: Apply over primary dressing as directed. Secured With: [employer], 4.5x3.1 (in/yd) (Generic) 1 x Per Day/30 Days Discharge Instructions: Secure with Kerlix as directed. Secured With: Paper T ape, 2x10 (in/yd) (Generic) 1 x Per Day/30 Days Discharge Instructions: Secure dressing with tape as directed. 1. In office sharp debridement 2. Collagen 3. Santyl 4. Follow-up in 2 weeks Electronic Signature(s) Signed: 08/10/2021 10:55:22 AM By: Kelly Corwin DO Entered By: Kelly [name] on 08/10/2021 10:54:58 -------------------------------------------------------------------------------- HxROS Details Patient Name: Date of Service: Kelly [name] [name] 08/10/2021 10:15 A M Medical Record Number: [id number] Patient Account Number: [id number] Date of Birth/Sex: Treating RN: February 13, 1996 (25 y.o. F) Primary Care Provider: Dione [name] Other Clinician: Referring Provider: Treating Provider/Extender: Kelly [name] in Treatment: 25 Information Obtained From Patient Respiratory Medical History: Positive for: Asthma Immunological Medical History: Positive for: Lupus Erythematosus Immunizations Pneumococcal Vaccine: Received Pneumococcal Vaccination:  No Implantable Devices None Family and Social History Cancer: No; Diabetes: Yes - Paternal Grandparents; Heart Disease: No; Hereditary Spherocytosis: No; Hypertension: Yes - Paternal Grandparents; Kidney Disease: No; Lung Disease: No; Seizures: No; Stroke: No; Thyroid Problems: Yes - Maternal Grandparents,Paternal Grandparents; Tuberculosis: No; Never smoker; Marital Status - Married; Alcohol Use: Moderate; Drug Use: Current History - Marijuana; Caffeine Use: Daily - Coffee; Financial Concerns: No; Food, Clothing or Shelter Needs: No; Support System Lacking: No Electronic Signature(s) Signed: 08/10/2021 10:55:22 AM By: Kelly Corwin DO Entered By: Kelly [name] on 08/10/2021 10:51:54 -------------------------------------------------------------------------------- SuperBill Details Patient Name: Date of Service: Kelly [name] [name] 08/10/2021 Medical Record Number: [id number] Patient Account Number: [id number] Date of Birth/Sex: Treating RN: [birth year] (25 y.o. Kelly [name] Primary Care Provider: Dione [name] Other Clinician: Referring Provider: Treating Provider/Extender: Kelly [name] in Treatment: 25 Diagnosis Coding ICD-10 Codes Code Description [phone] Non-pressure chronic ulcer of other part of left lower leg with fat layer exposed L97.812 Non-pressure chronic ulcer of other part of right lower leg with fat layer exposed M32.9 Systemic lupus erythematosus, unspecified Facility Procedures CPT4 Code: [id number] Description: 11042 - DEB SUBQ TISSUE 20 SQ CM/< ICD-10 Diagnosis Description L97.822 Non-pressure chronic ulcer of other part of left lower leg with fat layer expo Modifier: sed Quantity: 1 Physician Procedures : CPT4 Code Description Modifier [id number] 11042 - WC PHYS SUBQ TISS 20 SQ CM ICD-10 Diagnosis Description L97.822 Non-pressure chronic ulcer of other part of left lower leg with fat layer exposed Quantity: 1 Electronic  Signature(s) Signed: 08/10/2021 10:55:22 AM By: Kelly Corwin DO Entered By:  Kelly [name] on 08/10/2021 10:55:10

## 2021-08-11 DIAGNOSIS — G4700 Insomnia, unspecified: Secondary | ICD-10-CM | POA: Diagnosis not present

## 2021-08-11 DIAGNOSIS — R87612 Low grade squamous intraepithelial lesion on cytologic smear of cervix (LGSIL): Secondary | ICD-10-CM | POA: Diagnosis not present

## 2021-08-11 DIAGNOSIS — Z0001 Encounter for general adult medical examination with abnormal findings: Secondary | ICD-10-CM | POA: Diagnosis not present

## 2021-08-11 DIAGNOSIS — Z23 Encounter for immunization: Secondary | ICD-10-CM | POA: Diagnosis not present

## 2021-08-11 DIAGNOSIS — Z113 Encounter for screening for infections with a predominantly sexual mode of transmission: Secondary | ICD-10-CM | POA: Diagnosis not present

## 2021-08-11 DIAGNOSIS — Z1331 Encounter for screening for depression: Secondary | ICD-10-CM | POA: Diagnosis not present

## 2021-08-11 DIAGNOSIS — Z01411 Encounter for gynecological examination (general) (routine) with abnormal findings: Secondary | ICD-10-CM | POA: Diagnosis not present

## 2021-08-11 DIAGNOSIS — Z6823 Body mass index (BMI) 23.0-23.9, adult: Secondary | ICD-10-CM | POA: Diagnosis not present

## 2021-08-11 DIAGNOSIS — E781 Pure hyperglyceridemia: Secondary | ICD-10-CM | POA: Diagnosis not present

## 2021-08-11 DIAGNOSIS — Z8349 Family history of other endocrine, nutritional and metabolic diseases: Secondary | ICD-10-CM | POA: Diagnosis not present

## 2021-08-17 ENCOUNTER — Encounter (HOSPITAL_BASED_OUTPATIENT_CLINIC_OR_DEPARTMENT_OTHER): Payer: BC Managed Care – PPO | Admitting: Internal Medicine

## 2021-08-17 ENCOUNTER — Other Ambulatory Visit: Payer: Self-pay

## 2021-08-17 DIAGNOSIS — Z833 Family history of diabetes mellitus: Secondary | ICD-10-CM | POA: Diagnosis not present

## 2021-08-17 DIAGNOSIS — L03116 Cellulitis of left lower limb: Secondary | ICD-10-CM

## 2021-08-17 DIAGNOSIS — M329 Systemic lupus erythematosus, unspecified: Secondary | ICD-10-CM | POA: Diagnosis not present

## 2021-08-17 DIAGNOSIS — L97812 Non-pressure chronic ulcer of other part of right lower leg with fat layer exposed: Secondary | ICD-10-CM | POA: Diagnosis not present

## 2021-08-17 DIAGNOSIS — L97822 Non-pressure chronic ulcer of other part of left lower leg with fat layer exposed: Secondary | ICD-10-CM | POA: Diagnosis not present

## 2021-08-17 NOTE — Progress Notes (Signed)
**Note Kelly via Obfuscation** [name], Kelly M. ([id number]) °Visit Report for 08/17/2021 °Arrival Information Details °Patient Name: Date of Service: °Kelly [name], [name] 08/17/2021 10:30 A M °Medical Record Number: [id number] °Patient Account Number: [id number] °Date of Birth/Sex: Treating RN: °12/23/1995 (25 y.o. F) [name], [name] °Primary Care : Partridge, James Other Clinician: °Referring : °Treating /Extender: Hoffman, Jessica °Partridge, James °Weeks in Treatment: 26 °Visit Information History Since Last Visit °Added or deleted any medications: No °Patient Arrived: Ambulatory °Any new allergies or adverse reactions: No °Arrival Time: 10:55 °Had a fall or experienced change in No °Accompanied By: self °activities of daily living that may affect °Transfer Assistance: None °risk of falls: °Patient Identification Verified: Yes °Signs or symptoms of abuse/neglect since last visito No °Secondary Verification Process Completed: Yes °Hospitalized since last visit: No °Patient Requires Transmission-Based Precautions: No °Implantable device outside of the clinic excluding No °Patient Has Alerts: Yes °cellular tissue based products placed in the center °Patient Alerts: Left ABi=1.18 since last visit: °Has Dressing in Place as Prescribed: Yes °Pain Present Now: Yes °Electronic Signature(s) °Signed: 08/17/2021 11:05:38 AM By: Dawkins, Destiny °Entered By: Dawkins, Destiny on 08/17/2021 10:56:54 °-------------------------------------------------------------------------------- °Clinic Level of Care Assessment Details °Patient Name: Date of Service: °Kelly [name], [name] 08/17/2021 10:30 A M °Medical Record Number: [id number] °Patient Account Number: [id number] °Date of Birth/Sex: Treating RN: °11/21/1995 (25 y.o. F) [name], [name] °Primary Care : Partridge, James Other Clinician: °Referring : °Treating /Extender: Hoffman, Jessica °Partridge, James °Weeks in Treatment: 26 °Clinic Level of Care Assessment  Items °TOOL 4 Quantity Score °X- 1 0 °Use when only an EandM is performed on FOLLOW-UP visit °ASSESSMENTS - Nursing Assessment / Reassessment °X- 1 10 °Reassessment of Co-morbidities (includes updates in patient status) °X- 1 5 °Reassessment of Adherence to Treatment Plan °ASSESSMENTS - Wound and Skin A ssessment / Reassessment °[] - 0 °Simple Wound Assessment / Reassessment - one wound °X- 3 5 °Complex Wound Assessment / Reassessment - multiple wounds °[] - 0 °Dermatologic / Skin Assessment (not related to wound area) °ASSESSMENTS - Focused Assessment °[] - 0 °Circumferential Edema Measurements - multi extremities °[] - 0 °Nutritional Assessment / Counseling / Intervention °X- 1 5 °Lower Extremity Assessment (monofilament, tuning fork, pulses) °[] - 0 °Peripheral Arterial Disease Assessment (using hand held doppler) °ASSESSMENTS - Ostomy and/or Continence Assessment and Care °[] - 0 °Incontinence Assessment and Management °[] - 0 °Ostomy Care Assessment and Management (repouching, etc.) °PROCESS - Coordination of Care °X - Simple Patient / Family Education for ongoing care 1 15 °[] - 0 °Complex (extensive) Patient / Family Education for ongoing care °X- 1 10 °Staff obtains Consents, Records, T Results / Process Orders °est °[] - 0 °Staff telephones HHA, Nursing Homes / Clarify orders / etc °[] - 0 °Routine Transfer to another Facility (non-emergent condition) °[] - 0 °Routine Hospital Admission (non-emergent condition) °[] - 0 °New Admissions / Insurance Authorizations / Ordering NPWT Apligraf, etc. °, °[] - 0 °Emergency Hospital Admission (emergent condition) °X- 1 10 °Simple Discharge Coordination °[] - 0 °Complex (extensive) Discharge Coordination °PROCESS - Special Needs °[] - 0 °Pediatric / Minor Patient Management °[] - 0 °Isolation Patient Management °[] - 0 °Hearing / Language / Visual special needs °[] - 0 °Assessment of Community assistance (transportation, D/C planning, etc.) °[] - 0 °Additional  assistance / Altered mentation °[] - 0 °Support Surface(s) Assessment (bed, cushion, seat, etc.) °INTERVENTIONS - Wound Cleansing / Measurement °[] - 0 °Simple Wound Cleansing - one wound °X- 3 5 °Complex Wound   Cleansing - multiple wounds X- 1 5 Wound Imaging (photographs - any number of wounds) [] - 0 Wound Tracing (instead of photographs) [] - 0 Simple Wound Measurement - one wound X- 3 5 Complex Wound Measurement - multiple wounds INTERVENTIONS - Wound Dressings [] - 0 Small Wound Dressing one or multiple wounds X- 1 15 Medium Wound Dressing one or multiple wounds [] - 0 Large Wound Dressing one or multiple wounds [] - 0 Application of Medications - topical [] - 0 Application of Medications - injection INTERVENTIONS - Miscellaneous [] - 0 External ear exam X- 1 5 Specimen Collection (cultures, biopsies, blood, body fluids, etc.) X- 1 5 Specimen(s) / Culture(s) sent or taken to Lab for analysis [] - 0 Patient Transfer (multiple staff / Civil Service fast streamer / Similar devices) [] - 0 Simple Staple / Suture removal (25 or less) [] - 0 Complex Staple / Suture removal (26 or more) [] - 0 Hypo / Hyperglycemic Management (close monitor of Blood Glucose) [] - 0 Ankle / Brachial Index (ABI) - do not check if billed separately X- 1 5 Vital Signs Has the patient been seen at the hospital within the last three years: Yes Total Score: 135 Level Of Care: New/Established - Level 4 Electronic Signature(s) Signed: 08/17/2021 4:57:12 PM By: Levan Hurst RN, BSN Entered By: Levan Hurst on 08/17/2021 16:35:29 -------------------------------------------------------------------------------- Encounter Discharge Information Details Patient Name: Date of Service: Kelly [name] 08/17/2021 10:30 A M Medical Record Number: [id number] Patient Account Number: [id number] Date of Birth/Sex: Treating RN: [birth year] (25 y.o. [name] Primary Care Cors[place] Franson: [name] Other  Clinician: Referring Ramsha Lonigro: Treating Lucita Montoya/Extender: Elsworth Soho in Treatment: 26 Encounter Discharge Information Items Discharge Condition: Stable Ambulatory Status: Ambulatory Discharge Destination: Home Transportation: Private Auto Accompanied By: alone Schedule Follow-up Appointment: Yes Clinical Summary of Care: Patient Declined Electronic Signature(s) Signed: 08/17/2021 4:57:12 PM By: Levan Hurst RN, BSN Entered By: Levan Hurst on 08/17/2021 16:36:05 -------------------------------------------------------------------------------- Multi Wound Chart Details Patient Name: Date of Service: Kelly [name] 08/17/2021 10:30 A M Medical Record Number: [id number] Patient Account Number: [id number] Date of Birth/Sex: Treating RN: March 20, 1996 (25 y.o. [name] Primary Care Hardy Harcum: [name] Other Clinician: Referring Arneisha Kincannon: Treating Avery Eustice/Extender: Elsworth Soho in Treatment: 26 Vital Signs Height(in): 62 Pulse(bpm): 95 Weight(lbs): Blood Pressure(mmHg): 132/89 Body Mass Index(BMI): Temperature(F): 98.6 Respiratory Rate(breaths/min): 18 Photos: [4:No Photos Left, Lateral Ankle] [5:No Photos Left, Anterior Ankle] [7:No Photos Left, Anterior Lower Leg] Wound Location: [4:Gradually Appeared] [5:Gradually Appeared] [7:Other Lesion] Wounding Event: [4:Cellulitis] [5:Cellulitis] [7:Lupus] Primary Etiology: [4:Lupus] [5:Lupus] [7:N/A] Secondary Etiology: [4:04/14/2021] [5:04/14/2021] [7:05/18/2021] Date Acquired: [4:16] [5:16] [7:13] Weeks of Treatment: [4:Open] [5:Open] [7:Open] Wound Status: [4:2.4x3.1x0.1] [5:1.5x0.7x0.2] [7:0.7x0.7x0.2] Measurements L x W x D (cm) [4:5.843] [5:0.825] [7:0.385] A (cm) : rea [4:0.584] [5:0.165] [7:0.077] Volume (cm) : [4:62.80%] [5:12.40%] [7:68.60%] % Reduction in Area: [4:90.70%] [5:12.20%] [7:79.10%] % Reduction in Volume: [4:Full Thickness  Without Exposed] [5:Full Thickness Without Exposed] [7:Full Thickness Without Exposed] Classification: [4:Support Structures Medium] [5:Support Structures Medium] [7:Support Structures Medium] Exudate Amount: [4:Serosanguineous] [5:Serosanguineous] [7:Serosanguineous] Exudate Type: [4:red, brown] [5:red, brown] [7:red, brown] Treatment Notes Electronic Signature(s) Signed: 08/17/2021 11:51:24 AM By: Kalman Shan DO Signed: 08/17/2021 4:57:12 PM By: Levan Hurst RN, BSN Entered By: Kalman Shan on 08/17/2021 11:32:52 -------------------------------------------------------------------------------- Kelly [name] Details Patient Name: Date of Service: Kelly [name] 08/17/2021 10:30 A M Medical Record Number: [id number] Patient Account Number: [id number] Date of Birth/Sex: Treating RN: 1996-02-20 (  25 y.o. F) [name], [name] °Primary Care : Partridge, James °Other Clinician: °Referring : °Treating /Extender: Hoffman, Jessica °Partridge, James °Weeks in Treatment: 26 °Multidisciplinary Care Plan reviewed with physician °Active Inactive °Wound/Skin Impairment °Nursing Diagnoses: °Impaired tissue integrity °Goals: °Patient/caregiver will verbalize understanding of skin care regimen °Date Initiated: 02/13/2021 °Target Resolution Date: 09/15/2021 °Goal Status: Active °Ulcer/skin breakdown will have a volume reduction of 30% by week 4 °Date Initiated: 02/13/2021 °Date Inactivated: 04/21/2021 °Target Resolution Date: 03/13/2021 °Unmet Reason: pt did not return to °Goal Status: Unmet clinic for F/U °Ulcer/skin breakdown will have a volume reduction of 80% by week 12 °Date Initiated: 04/21/2021 °Date Inactivated: 07/13/2021 °Target Resolution Date: 07/27/2021 °Goal Status: Met °Interventions: °Assess patient/caregiver ability to obtain necessary supplies °Assess patient/caregiver ability to perform ulcer/skin care regimen upon admission and as needed °Assess  ulceration(s) every visit °Provide education on ulcer and skin care °Treatment Activities: °Skin care regimen initiated : 02/13/2021 °Topical wound management initiated : 02/13/2021 °Notes: °06/29/21: Wounds 3 and 6 only at 80% volume reduction. Target date extended. °Electronic Signature(s) °Signed: 08/17/2021 4:57:12 PM By: [name], [name] RN, BSN °Entered By: [name], [name] on 08/17/2021 16:34:44 °-------------------------------------------------------------------------------- °Pain Assessment Details °Patient Name: °Date of Service: °Kelly [name], [name] 08/17/2021 10:30 A M °Medical Record Number: [id number] °Patient Account Number: [id number] °Date of Birth/Sex: °Treating RN: °08/22/1996 (25 y.o. F) [name], [name] °Primary Care : Partridge, James °Other Clinician: °Referring : °Treating /Extender: Hoffman, Jessica °Partridge, James °Weeks in Treatment: 26 °Active Problems °Location of Pain Severity and Description of Pain °Patient Has Paino Yes °Site Locations °Pain Location: °Pain in Ulcers °With Dressing Change: Yes °Duration of the Pain. °Constant / Intermittento Intermittent °Rate the pain. °Current Pain Level: 5 °Character of Pain °Describe the Pain: Tender, Throbbing °Pain Management and Medication °Current Pain Management: °Medication: No °Cold Application: No °Rest: No °Massage: No °Activity: No °T.E.N.S.: No °Heat Application: No °Leg drop or elevation: No °Is the Current Pain Management Adequate: Inadequate °How does your wound impact your activities of daily livingo °Sleep: No °Bathing: No °Appetite: No °Relationship With Others: No °Bladder Continence: No °Emotions: No °Bowel Continence: No °Work: No °Toileting: No °Drive: No °Dressing: No °Hobbies: No °Electronic Signature(s) °Signed: 08/17/2021 4:57:12 PM By: [name], [name] RN, BSN °Entered By: [name], [name] on 08/17/2021  16:34:23 °-------------------------------------------------------------------------------- °Patient/Caregiver Education Details °Patient Name: °Date of Service: °Kelly [name], [name] 12/22/2022andnbsp10:30 A M °Medical Record Number: [id number] °Patient Account Number: [id number] °Date of Birth/Gender: °Treating RN: °10/10/1995 (25 y.o. F) [name], [name] °Primary Care Physician: Partridge, James °Other Clinician: °Referring Physician: °Treating Physician/Extender: Hoffman, Jessica °Partridge, James °Weeks in Treatment: 26 °Education Assessment °Education Provided To: °Patient °Education Topics Provided °Wound/Skin Impairment: °Methods: Explain/Verbal °Responses: State content correctly °Electronic Signature(s) °Signed: 08/17/2021 4:57:12 PM By: [name], [name] RN, BSN °Entered By: [name], [name] on 08/17/2021 16:34:57 °-------------------------------------------------------------------------------- °Wound Assessment Details °Patient Name: °Date of Service: °Kelly [name], [name] 08/17/2021 10:30 A M °Medical Record Number: [id number] °Patient Account Number: [id number] °Date of Birth/Sex: °Treating RN: °01/20/1996 (25 y.o. F) [name], [name] °Primary Care : Partridge, James °Other Clinician: °Referring : °Treating /Extender: Hoffman, Jessica °Partridge, James °Weeks in Treatment: 26 °Wound Status °Wound Number: 4 °Primary Etiology: Cellulitis °Wound Location: Left, Lateral Ankle °Secondary Etiology: Lupus °Wounding Event: Gradually Appeared °Wound Status: Open °Date Acquired: 04/14/2021 °Weeks Of Treatment: 16 °Clustered Wound: No °Wound Measurements °Length: (cm) 2.4 °Width: (cm) 3.1 °Depth: (cm) 0.1 °Area: (cm²) 5.843 °Volume: (cm³) 0.584 °% Reduction in Area: 62.8% °% Reduction in Volume: 90.7% °Wound   Description Classification: Full Thickness Without Exposed Support Structu Exudate Amount: Medium Exudate Type: Serosanguineous Exudate Color: red, brown res Electronic Signature(s) Signed:  08/17/2021 11:05:38 AM By: Sandre Kitty Signed: 08/17/2021 4:57:12 PM By: Levan Hurst RN, BSN Entered By: Sandre Kitty on 08/17/2021 10:57:41 -------------------------------------------------------------------------------- Wound Assessment Details Patient Name: Date of Service: Kelly [name] 08/17/2021 10:30 A M Medical Record Number: [id number] Patient Account Number: [id number] Date of Birth/Sex: Treating RN: 02/17/1996 (25 y.o. [name] Primary Care Capri Raben: [name] Other Clinician: Referring Shantina Chronister: Treating Suhani Stillion/Extender: Elsworth Soho in Treatment: 26 Wound Status Wound Number: 5 Primary Etiology: Cellulitis Wound Location: Left, Anterior Ankle Secondary Etiology: Lupus Wounding Event: Gradually Appeared Wound Status: Open Date Acquired: 04/14/2021 Weeks Of Treatment: 16 Clustered Wound: No Wound Measurements Length: (cm) 1.5 Width: (cm) 0.7 Depth: (cm) 0.2 Area: (cm) 0.825 Volume: (cm) 0.165 % Reduction in Area: 12.4% % Reduction in Volume: 12.2% Wound Description Classification: Full Thickness Without Exposed Support Structu Exudate Amount: Medium Exudate Type: Serosanguineous Exudate Color: red, brown res Electronic Signature(s) Signed: 08/17/2021 11:05:38 AM By: Sandre Kitty Signed: 08/17/2021 4:57:12 PM By: Levan Hurst RN, BSN Entered By: Sandre Kitty on 08/17/2021 10:57:41 -------------------------------------------------------------------------------- Wound Assessment Details Patient Name: Date of Service: Kelly [name] 08/17/2021 10:30 A M Medical Record Number: [id number] Patient Account Number: [id number] Date of Birth/Sex: Treating RN: November 28, 1995 (25 y.o. [name] Primary Care Lander Eslick: [name] Other Clinician: Referring Jaylyn Iyer: Treating Makaiya Geerdes/Extender: Elsworth Soho in Treatment: 26 Wound Status Wound  Number: 7 Primary Etiology: Lupus Wound Location: Left, Anterior Lower Leg Wound Status: Open Wounding Event: Other Lesion Date Acquired: 05/18/2021 Weeks Of Treatment: 13 Clustered Wound: No Wound Measurements Length: (cm) 0.7 Width: (cm) 0.7 Depth: (cm) 0.2 Area: (cm) 0.385 Volume: (cm) 0.077 Wound Description Classification: Full Thickness Without Exposed Support Structu Exudate Amount: Medium Exudate Type: Serosanguineous Exudate Color: red, brown res % Reduction in Area: 68.6% % Reduction in Volume: 79.1% Electronic Signature(s) Signed: 08/17/2021 11:05:38 AM By: Sandre Kitty Signed: 08/17/2021 4:57:12 PM By: Levan Hurst RN, BSN Entered By: Sandre Kitty on 08/17/2021 10:57:41 -------------------------------------------------------------------------------- Vitals Details Patient Name: Date of Service: Kelly [name] 08/17/2021 10:30 A M Medical Record Number: [id number] Patient Account Number: [id number] Date of Birth/Sex: Treating RN: [birth year] (25 y.o. Kelly [name], Briant Cedar Primary Care Connee Ikner: [name] Other Clinician: Referring Johan Antonacci: Treating Hershall Benkert/Extender: Elsworth Soho in Treatment: 26 Vital Signs Time Taken: 10:56 Temperature (F): 98.6 Height (in): 62 Pulse (bpm): 95 Respiratory Rate (breaths/min): 18 Blood Pressure (mmHg): 132/89 Reference Range: 80 - 120 mg / dl Electronic Signature(s) Signed: 08/17/2021 11:05:38 AM By: Sandre Kitty Entered By: Sandre Kitty on 08/17/2021 10:57:17

## 2021-08-23 ENCOUNTER — Other Ambulatory Visit: Payer: Self-pay

## 2021-08-23 ENCOUNTER — Ambulatory Visit (INDEPENDENT_AMBULATORY_CARE_PROVIDER_SITE_OTHER): Payer: BC Managed Care – PPO | Admitting: Nurse Practitioner

## 2021-08-23 ENCOUNTER — Other Ambulatory Visit (HOSPITAL_COMMUNITY)
Admission: RE | Admit: 2021-08-23 | Discharge: 2021-08-23 | Disposition: A | Discharge status: Home / Self Care | Payer: BC Managed Care – PPO | Source: Ambulatory Visit | Attending: Nurse Practitioner | Admitting: Nurse Practitioner

## 2021-08-23 ENCOUNTER — Encounter: Payer: Self-pay | Admitting: Nurse Practitioner

## 2021-08-23 VITALS — BP 130/87 | HR 93 | Ht 63.0 in | Wt 140.0 lb

## 2021-08-23 DIAGNOSIS — Z113 Encounter for screening for infections with a predominantly sexual mode of transmission: Secondary | ICD-10-CM | POA: Diagnosis not present

## 2021-08-23 DIAGNOSIS — Z124 Encounter for screening for malignant neoplasm of cervix: Secondary | ICD-10-CM | POA: Insufficient documentation

## 2021-08-23 LAB — AEROBIC/ANAEROBIC CULTURE W GRAM STAIN (SURGICAL/DEEP WOUND)

## 2021-08-23 NOTE — Progress Notes (Signed)
Pt had pap at primary last week however sample was inadequate. Needs repeated. Last pap here in 03/2019 was ASCUS HPV+. Also would like STD screening.

## 2021-08-23 NOTE — Progress Notes (Signed)
GYNECOLOGY OFFICE VISIT NOTE   History:  25 y.o. G1P0101 here today for pap smear - she had annual visit at PCP office but pap smear showed not enough cells to evaluate. She denies any abnormal vaginal discharge, bleeding, pelvic pain or other concerns.  She does have Lupus and is under treatment by MD in [place].  Currently has leg ulcers. Wanting to know about another pregnancy as she had a good pregnancy with her 26 year old without exacerbation of Lupus during the pregnancy.  But since the pregnancy has had some problems with skin lesions.  Advised to check with MD managing lupus and discuss effects of pregnancy on lupus during and after a pregnancy before stopping Depo.  Past Medical History:  Diagnosis Date   Anxiety    Chest discomfort feb 2017   Pt states due to shortness of breath   Cough present for greater than 3 weeks    Late Entry to [employer]- April 2020- Social Distancing  11/27/2018   Pt signed up for [employer]. Will come to office to pick up BP cuff.    Lupus (systemic lupus erythematosus) (HCC)    dx 2010   Pneumonia    2015   Shortness of breath dyspnea feb 2017   changed inhaler with relief    Past Surgical History:  Procedure Laterality Date   BRONCHOSCOPY  11/2013   CESAREAN SECTION N/A 03/16/2019   Procedure: CESAREAN SECTION;  Surgeon: Levie Heritage, DO;  Location: [facility] LD ORS;  Service: Obstetrics;  Laterality: N/A;   WISDOM TOOTH EXTRACTION  2014    The following portions of the patient's history were reviewed and updated as appropriate: allergies, current medications, past family history, past medical history, past social history, past surgical history and problem list.    Review of Systems:  Pertinent items noted in HPI and remainder of comprehensive ROS otherwise negative.  Objective:  Physical Exam BP 130/87    Pulse 93    Ht 5\' 3"  (1.6 m)    Wt 140 lb (63.5 kg)    LMP  (Exact Date)    BMI 24.80 kg/m  CONSTITUTIONAL: Well-developed,  well-nourished female in no acute distress.  HENT:  Normocephalic, atraumatic. External right and left ear normal.  EYES: Conjunctivae and EOM are normal. Pupils are equal, round.  No scleral icterus.  NECK: Normal range of motion, supple, no masses SKIN: Skin is warm and dry. No rash noted. Not diaphoretic. No erythema. No pallor. NEUROLOGIC: Alert and oriented to person, place, and time. Normal muscle tone coordination. No cranial nerve deficit noted. PSYCHIATRIC: Normal mood and affect. Normal behavior. Normal judgment and thought content. CARDIOVASCULAR: Normal heart rate noted RESPIRATORY: Effort and breath sounds normal, no problems with respiration noted ABDOMEN: Soft, no distention noted.   PELVIC: Normal appearing external genitalia; normal appearing vaginal mucosa and cervix.  No abnormal discharge noted.  Normal uterine size, no other palpable masses, no uterine or adnexal tenderness. MUSCULOSKELETAL: Normal range of motion. No edema noted.  Labs and Imaging No results found.  Assessment & Plan:  1. Encounter for Papanicolaou smear for cervical cancer screening Hx of lupus and has PCP in [facility] who manages lupus - Cytology - PAP( Venturia)  2. Screen for STD (sexually transmitted disease) Patient requested  - RPR+HBsAg+HCVAb+... - Cervicovaginal ancillary only( [place])   Routine preventative health maintenance measures emphasized. Please refer to After Visit Summary for other counseling recommendations.   No follow-ups on file.  Will follow up  with PCP and see this office for Depo.     Total face-to-face time with patient:  10  minutes.  Over 50% of encounter was spent on counseling and coordination of care.  Nolene Bernheim, RN, MSN, NP-BC Nurse Practitioner, [facility] for [employer], [facility] 08/23/2021 3:51 PM

## 2021-08-24 ENCOUNTER — Encounter (HOSPITAL_BASED_OUTPATIENT_CLINIC_OR_DEPARTMENT_OTHER): Payer: BC Managed Care – PPO | Admitting: Internal Medicine

## 2021-08-24 DIAGNOSIS — M329 Systemic lupus erythematosus, unspecified: Secondary | ICD-10-CM | POA: Diagnosis not present

## 2021-08-24 DIAGNOSIS — L97812 Non-pressure chronic ulcer of other part of right lower leg with fat layer exposed: Secondary | ICD-10-CM | POA: Diagnosis not present

## 2021-08-24 DIAGNOSIS — L97822 Non-pressure chronic ulcer of other part of left lower leg with fat layer exposed: Secondary | ICD-10-CM

## 2021-08-24 DIAGNOSIS — L03116 Cellulitis of left lower limb: Secondary | ICD-10-CM | POA: Diagnosis not present

## 2021-08-24 DIAGNOSIS — Z833 Family history of diabetes mellitus: Secondary | ICD-10-CM | POA: Diagnosis not present

## 2021-08-24 LAB — CERVICOVAGINAL ANCILLARY ONLY
Chlamydia: NEGATIVE
Comment: NEGATIVE
Comment: NEGATIVE
Comment: NORMAL
Neisseria Gonorrhea: NEGATIVE
Trichomonas: NEGATIVE

## 2021-08-24 LAB — RPR+HBSAG+HCVAB+...
HIV Screen 4th Generation wRfx: NONREACTIVE
Hep C Virus Ab: <0.1 s/co ratio (ref 0.0–0.9)
Hepatitis B Surface Ag: NEGATIVE
RPR Ser Ql: NONREACTIVE

## 2021-08-25 LAB — CYTOLOGY - PAP: Diagnosis: NEGATIVE

## 2021-08-25 NOTE — Progress Notes (Signed)
Kelly [name] ([id number]) Visit Report for 08/24/2021 Arrival Information Details Patient Name: Date of Service: Kelly [name]. 08/24/2021 10:15 A [name] Medical Record Number: [id number] Patient Account Number: [id number] Date of Birth/Sex: Treating RN: 03/10/1996 (25 y.o. [name] Primary Care Meiah Zamudio: [name] Other Clinician: Referring Arra Connaughton: Treating Jaylee Lantry/Extender: Elsworth Soho in Treatment: 27 Visit Information History Since Last Visit Added or deleted any medications: No Patient Arrived: Ambulatory Any new allergies or adverse reactions: No Arrival Time: 10:15 Had a fall or experienced change in No Accompanied By: spouse activities of daily living that may affect Transfer Assistance: None risk of falls: Patient Identification Verified: Yes Signs or symptoms of abuse/neglect since last visito No Secondary Verification Process Completed: Yes Hospitalized since last visit: No Patient Requires Transmission-Based Precautions: No Implantable device outside of the clinic excluding No Patient Has Alerts: Yes cellular tissue based products placed in the center Patient Alerts: Left ABi=1.18 since last visit: Has Dressing in Place as Prescribed: Yes Pain Present Now: No Electronic Signature(s) Signed: 08/25/2021 2:54:09 PM By: Levan Hurst RN, BSN Entered By: Levan Hurst on 08/24/2021 10:17:23 -------------------------------------------------------------------------------- Encounter Discharge Information Details Patient Name: Date of Service: Kelly [name]. 08/24/2021 10:15 A [name] Medical Record Number: [id number] Patient Account Number: [id number] Date of Birth/Sex: Treating RN: [birth year] (25 y.o. [name] Primary Care Milan Clare: [name] Other Clinician: Referring Aliviana Burdell: Treating Amanat Hackel/Extender: Elsworth Soho in Treatment: 27 Encounter Discharge  Information Items Post Procedure Vitals Discharge Condition: Stable Temperature (F): 98.5 Ambulatory Status: Ambulatory Pulse (bpm): 96 Discharge Destination: Home Respiratory Rate (breaths/min): 16 Transportation: Private Auto Blood Pressure (mmHg): 118/82 Accompanied By: spouse Schedule Follow-up Appointment: Yes Clinical Summary of Care: Patient Declined Electronic Signature(s) Signed: 08/25/2021 2:54:09 PM By: Levan Hurst RN, BSN Entered By: Levan Hurst on 08/24/2021 12:28:32 -------------------------------------------------------------------------------- Lower Extremity Assessment Details Patient Name: Date of Service: Kelly [name]. 08/24/2021 10:15 A [name] Medical Record Number: [id number] Patient Account Number: [id number] Date of Birth/Sex: Treating RN: 1995/10/31 (25 y.o. [name] Primary Care Ariaunna Longsworth: [name] Other Clinician: Referring Harless Molinari: Treating Jacub Waiters/Extender: Elsworth Soho in Treatment: 27 Edema Assessment Assessed: Shirlyn Goltz: No] Patrice Paradise: No] Edema: [Left: N] [Right: o] Calf Left: Right: Point of Measurement: 27 cm From Medial Instep 34 cm Ankle Left: Right: Point of Measurement: 8 cm From Medial Instep 22 cm Vascular Assessment Pulses: Dorsalis Pedis Palpable: [Left:Yes] Electronic Signature(s) Signed: 08/25/2021 2:54:09 PM By: Levan Hurst RN, BSN Entered By: Levan Hurst on 08/24/2021 10:44:09 -------------------------------------------------------------------------------- Multi Wound Chart Details Patient Name: Date of Service: Kelly [name]. 08/24/2021 10:15 A [name] Medical Record Number: [id number] Patient Account Number: [id number] Date of Birth/Sex: Treating RN: 1996-02-23 (25 y.o. [name] Primary Care Braeley Buskey: [name] Other Clinician: Referring Gavin Faivre: Treating Sihaam Chrobak/Extender: Elsworth Soho in Treatment: 27 Vital  Signs Height(in): 69 Pulse(bpm): 4 Weight(lbs): Blood Pressure(mmHg): 118/82 Body Mass Index(BMI): Temperature(F): 98.5 Respiratory Rate(breaths/min): 16 Photos: [4:Left, Lateral Ankle] [5:Left, Anterior Ankle] [7:Left, Anterior Lower Leg] Wound Location: [4:Gradually Appeared] [5:Gradually Appeared] [7:Other Lesion] Wounding Event: [4:Cellulitis] [5:Cellulitis] [7:Lupus] Primary Etiology: [4:Lupus] [5:Lupus] [7:N/A] Secondary Etiology: [4:Asthma, Lupus Erythematosus] [5:Asthma, Lupus Erythematosus] [7:Asthma, Lupus Erythematosus] Comorbid History: [4:04/14/2021] [5:04/14/2021] [7:05/18/2021] Date Acquired: [4:17] [5:17] [7:14] Weeks of Treatment: [4:Open] [5:Open] [7:Open] Wound Status: [4:2.5x2.6x0.1] [5:1.9x0.8x0.2] [7:0.5x0.5x0.3] Measurements L x W x D (cm) [4:5.105] [5:1.194] [7:0.196] A (cm) : rea [4:0.511] [5:0.239] [7:0.059] Volume (cm) : [4:67.50%] [5:-26.80%] [7:84.00%] % Reduction  in A rea: [4:91.90%] [5:-27.10%] [7:84.00%] % Reduction in Volume: [5:12] [7:10] Position 1 (o'clock): [5:1.4] [7:3] Maximum Distance 1 (cm): [4:No] [5:Yes] [7:Yes] Tunneling: [4:Full Thickness Without Exposed] [5:Full Thickness Without Exposed] [7:Full Thickness Without Exposed] Classification: [4:Support Structures Medium] [5:Support Structures Medium] [7:Support Structures Medium] Exudate A mount: [4:Serosanguineous] [5:Serosanguineous] [7:Purulent] Exudate Type: [4:red, brown] [5:red, brown] [7:yellow, brown, green] Exudate Color: [4:Flat and Intact] [5:Flat and Intact] [7:Flat and Intact] Wound Margin: [4:Small (1-33%)] [5:Small (1-33%)] [7:None Present (0%)] Granulation A mount: [4:Pink, Pale] [5:Pink] [7:N/A] Granulation Quality: [4:Large (67-100%)] [5:Large (67-100%)] [7:Large (67-100%)] Necrotic A mount: [4:Fat Layer (Subcutaneous Tissue): Yes Fat Layer (Subcutaneous Tissue): Yes Fat Layer (Subcutaneous Tissue): Yes] Exposed Structures: [4:Fascia: No Tendon: No Muscle: No Joint: No  Bone: No Small (1-33%)] [5:Fascia: No Tendon: No Muscle: No Joint: No Bone: No Small (1-33%)] [7:Fascia: No Tendon: No Muscle: No Joint: No Bone: No Small (1-33%)] Epithelialization: [4:Debridement - Excisional] [5:N/A] [7:N/A] Debridement: Pre-procedure Verification/Time Out 10:48 [5:N/A] [7:N/A] Taken: [4:Subcutaneous, Slough] [5:N/A] [7:N/A] Tissue Debrided: [4:Skin/Subcutaneous Tissue] [5:N/A] [7:N/A] Level: [4:6.5] [5:N/A] [7:N/A] Debridement A (sq cm): [4:rea Curette] [5:N/A] [7:N/A] Instrument: [4:Minimum] [5:N/A] [7:N/A] Bleeding: [4:Pressure] [5:N/A] [7:N/A] Hemostasis A chieved: [4:0] [5:N/A] [7:N/A] Procedural Pain: [4:0] [5:N/A] [7:N/A] Post Procedural Pain: [4:Procedure was tolerated well] [5:N/A] [7:N/A] Debridement Treatment Response: [4:2.5x2.6x0.1] [5:N/A] [7:N/A] Post Debridement Measurements L x W x D (cm) [4:0.511] [5:N/A] [7:N/A] Post Debridement Volume: (cm) [4:Debridement] [5:N/A] [7:N/A] Treatment Notes Electronic Signature(s) Signed: 08/24/2021 11:05:18 AM By: Kalman Shan DO Signed: 08/25/2021 2:54:09 PM By: Levan Hurst RN, BSN Entered By: Kalman Shan on 08/24/2021 10:57:22 -------------------------------------------------------------------------------- Multi-Disciplinary Care Plan Details Patient Name: Date of Service: Kelly Lou [place]. 08/24/2021 10:15 A [name] Medical Record Number: [id number] Patient Account Number: [id number] Date of Birth/Sex: Treating RN: 1995/11/30 (25 y.o. [name] Primary Care Elenora Hawbaker: [name] Other Clinician: Referring Otniel Hoe: Treating Edmar Blankenburg/Extender: Elsworth Soho in Treatment: [place] reviewed with physician Active Inactive Wound/Skin Impairment Nursing Diagnoses: Impaired tissue integrity Goals: Patient/caregiver will verbalize understanding of skin care regimen Date Initiated: 02/13/2021 Target Resolution Date: 09/15/2021 Goal  Status: Active Ulcer/skin breakdown will have a volume reduction of 30% by week 4 Date Initiated: 02/13/2021 Date Inactivated: 04/21/2021 Target Resolution Date: 03/13/2021 Unmet Reason: pt did not return to Goal Status: Unmet clinic for F/U Ulcer/skin breakdown will have a volume reduction of 80% by week 12 Date Initiated: 04/21/2021 Date Inactivated: 07/13/2021 Target Resolution Date: 07/27/2021 Goal Status: Met Interventions: Assess patient/caregiver ability to obtain necessary supplies Assess patient/caregiver ability to perform ulcer/skin care regimen upon admission and as needed Assess ulceration(s) every visit Provide education on ulcer and skin care Treatment Activities: Skin care regimen initiated : 02/13/2021 Topical wound management initiated : 02/13/2021 Notes: 06/29/21: Wounds 3 and 6 only at 80% volume reduction. Target date extended. Electronic Signature(s) Signed: 08/25/2021 2:54:09 PM By: Levan Hurst RN, BSN Entered By: Levan Hurst on 08/24/2021 10:39:49 -------------------------------------------------------------------------------- Pain Assessment Details Patient Name: Date of Service: Kelly [name]. 08/24/2021 10:15 A [name] Medical Record Number: [id number] Patient Account Number: [id number] Date of Birth/Sex: Treating RN: 06-20-1996 (25 y.o. [name] Primary Care Sharod Petsch: [name] Other Clinician: Referring Tyriana Helmkamp: Treating Itamar Mcgowan/Extender: Elsworth Soho in Treatment: 27 Active Problems Location of Pain Severity and Description of Pain Patient Has Paino No Site Locations Pain Management and Medication Current Pain Management: Electronic Signature(s) Signed: 08/25/2021 2:54:09 PM By: Levan Hurst RN, BSN Entered By: Levan Hurst on 08/24/2021 10:17:45 -------------------------------------------------------------------------------- Patient/Caregiver  Education Details Patient Name: Date of  Service: Kelly [name], Kelly [name]. 12/29/2022andnbsp10:15 A [name] Medical Record Number: [id number] Patient Account Number: [id number] Date of Birth/Gender: Treating RN: October 22, 1995 (25 y.o. [name] Primary Care Physician: [name] Other Clinician: Referring Physician: Treating Physician/Extender: Elsworth Soho in Treatment: 27 Education Assessment Education Provided To: Patient Education Topics Provided Wound/Skin Impairment: Methods: Explain/Verbal Responses: State content correctly [employer]) Signed: 08/25/2021 2:54:09 PM By: Levan Hurst RN, BSN Entered By: Levan Hurst on 08/24/2021 10:40:00 -------------------------------------------------------------------------------- Wound Assessment Details Patient Name: Date of Service: Kelly [name]. 08/24/2021 10:15 A [name] Medical Record Number: [id number] Patient Account Number: [id number] Date of Birth/Sex: Treating RN: 10/12/1995 (25 y.o. [name] Primary Care Shamanda Len: [name] Other Clinician: Referring Lendon George: Treating Nefi Musich/Extender: Elsworth Soho in Treatment: 27 Wound Status Wound Number: 4 Primary Etiology: Cellulitis Wound Location: Left, Lateral Ankle Secondary Etiology: Lupus Wounding Event: Gradually Appeared Wound Status: Open Date Acquired: 04/14/2021 Comorbid History: Asthma, Lupus Erythematosus Weeks Of Treatment: 17 Clustered Wound: No Photos Wound Measurements Length: (cm) 2.5 Width: (cm) 2.6 Depth: (cm) 0.1 Area: (cm) 5.105 Volume: (cm) 0.511 % Reduction in Area: 67.5% % Reduction in Volume: 91.9% Epithelialization: Small (1-33%) Tunneling: No Undermining: No Wound Description Classification: Full Thickness Without Exposed Support Structures Wound Margin: Flat and Intact Exudate Amount: Medium Exudate Type: Serosanguineous Exudate Color: red, brown Foul Odor After Cleansing:  No Slough/Fibrino Yes Wound Bed Granulation Amount: Small (1-33%) Exposed Structure Granulation Quality: Pink, Pale Fascia Exposed: No Necrotic Amount: Large (67-100%) Fat Layer (Subcutaneous Tissue) Exposed: Yes Necrotic Quality: Adherent Slough Tendon Exposed: No Muscle Exposed: No Joint Exposed: No Bone Exposed: No Electronic Signature(s) Signed: 08/25/2021 2:54:09 PM By: Levan Hurst RN, BSN Entered By: Levan Hurst on 08/24/2021 10:31:55 -------------------------------------------------------------------------------- Wound Assessment Details Patient Name: Date of Service: Kelly Lou [place]. 08/24/2021 10:15 A [name] Medical Record Number: [id number] Patient Account Number: [id number] Date of Birth/Sex: Treating RN: August 27, 1996 (25 y.o. [name] Primary Care Glessie Eustice: [name] Other Clinician: Referring Ishita Mcnerney: Treating Yandel Zeiner/Extender: Elsworth Soho in Treatment: 27 Wound Status Wound Number: 5 Primary Etiology: Cellulitis Wound Location: Left, Anterior Ankle Secondary Etiology: Lupus Wounding Event: Gradually Appeared Wound Status: Open Date Acquired: 04/14/2021 Comorbid History: Asthma, Lupus Erythematosus Weeks Of Treatment: 17 Clustered Wound: No Photos Wound Measurements Length: (cm) 1.9 Width: (cm) 0.8 Depth: (cm) 0.2 Area: (cm) 1.194 Volume: (cm) 0.239 % Reduction in Area: -26.8% % Reduction in Volume: -27.1% Epithelialization: Small (1-33%) Tunneling: Yes Position (o'clock): 12 Maximum Distance: (cm) 1.4 Undermining: No Wound Description Classification: Full Thickness Without Exposed Support Structures Wound Margin: Flat and Intact Exudate Amount: Medium Exudate Type: Serosanguineous Exudate Color: red, brown Foul Odor After Cleansing: No Slough/Fibrino Yes Wound Bed Granulation Amount: Small (1-33%) Exposed Structure Granulation Quality: Pink Fascia Exposed: No Necrotic Amount: Large  (67-100%) Fat Layer (Subcutaneous Tissue) Exposed: Yes Necrotic Quality: Adherent Slough Tendon Exposed: No Muscle Exposed: No Joint Exposed: No Bone Exposed: No Electronic Signature(s) Signed: 08/25/2021 2:54:09 PM By: Levan Hurst RN, BSN Entered By: Levan Hurst on 08/24/2021 10:32:26 -------------------------------------------------------------------------------- Wound Assessment Details Patient Name: Date of Service: Kelly Lou [place]. 08/24/2021 10:15 A [name] Medical Record Number: [id number] Patient Account Number: [id number] Date of Birth/Sex: Treating RN: [birth year] (25 y.o. [name] Primary Care Ekansh Sherk: [name] Other Clinician: Referring Zailah Zagami: Treating Kordell Jafri/Extender: Elsworth Soho in Treatment: 27 Wound Status Wound Number: 7 Primary Etiology: Lupus Wound  Location: Left, Anterior Lower Leg Wound Status: Open Wounding Event: Other Lesion Comorbid History: Asthma, Lupus Erythematosus Date Acquired: 05/18/2021 Weeks Of Treatment: 14 Clustered Wound: No Photos Wound Measurements Length: (cm) 0.5 Width: (cm) 0.5 Depth: (cm) 0.3 Area: (cm) 0.196 Volume: (cm) 0.059 % Reduction in Area: 84% % Reduction in Volume: 84% Epithelialization: Small (1-33%) Tunneling: Yes Position (o'clock): 10 Maximum Distance: (cm) 3 Undermining: No Wound Description Classification: Full Thickness Without Exposed Support Structures Wound Margin: Flat and Intact Exudate Amount: Medium Exudate Type: Purulent Exudate Color: yellow, brown, green Foul Odor After Cleansing: No Slough/Fibrino Yes Wound Bed Granulation Amount: None Present (0%) Exposed Structure Necrotic Amount: Large (67-100%) Fascia Exposed: No Necrotic Quality: Adherent Slough Fat Layer (Subcutaneous Tissue) Exposed: Yes Tendon Exposed: No Muscle Exposed: No Joint Exposed: No Bone Exposed: No Electronic Signature(s) Signed: 08/25/2021 2:54:09 PM By:  Levan Hurst RN, BSN Entered By: Levan Hurst on 08/24/2021 10:33:01 -------------------------------------------------------------------------------- Vitals Details Patient Name: Date of Service: Kelly Lou [place]. 08/24/2021 10:15 A [name] Medical Record Number: [id number] Patient Account Number: [id number] Date of Birth/Sex: Treating RN: 1996-05-23 (25 y.o. Benjamine Sprague, Briant Cedar Primary Care Raeven Pint: [name] Other Clinician: Referring Tyronica Truxillo: Treating Ignacio Lowder/Extender: Elsworth Soho in Treatment: 27 Vital Signs Time Taken: 10:15 Temperature (F): 98.5 Height (in): 62 Pulse (bpm): 96 Respiratory Rate (breaths/min): 16 Blood Pressure (mmHg): 118/82 Reference Range: 80 - 120 mg / dl Electronic Signature(s) Signed: 08/25/2021 2:54:09 PM By: Levan Hurst RN, BSN Entered By: Levan Hurst on 08/24/2021 10:17:38

## 2021-08-25 NOTE — Progress Notes (Signed)
Kelly [name] ([id number]) Visit Report for 08/17/2021 Chief Complaint Document Details Patient Name: Date of Service: Kelly [name]. 08/17/2021 10:30 A M Medical Record Number: [id number] Patient Account Number: [id number] Date of Birth/Sex: Treating RN: Jun 29, 1996 (25 y.o. [name] Primary Care Provider: Dione Booze Other Clinician: Referring Provider: Treating Provider/Extender: Gennaro Africa in Treatment: 26 Information Obtained from: Patient Chief Complaint Bilateral lower extremity wounds 04/21/21: left lower extremity wound s/p cellulitis Electronic Signature(s) Signed: 08/17/2021 11:51:24 AM By: Geralyn Corwin DO Entered By: Geralyn Corwin on 08/17/2021 11:33:06 -------------------------------------------------------------------------------- HPI Details Patient Name: Date of Service: Kelly [name] Kelly H M. 08/17/2021 10:30 A M Medical Record Number: [id number] Patient Account Number: [id number] Date of Birth/Sex: Treating RN: Jul 11, 1996 (25 y.o. [name] Primary Care Provider: Dione Booze Other Clinician: Referring Provider: Treating Provider/Extender: Gennaro Africa in Treatment: 26 History of Present Illness HPI Description: Admission 6/21 Kelly [name] is A 25 year old female with a past medical history of systemic lupus erythematosus that presents with bilateral lower extremity wounds that started in April 2022. She has had skin issues in the past where she developed very small wounds but these healed with time. She has never had open wounds like she does on her legs. She currently reports minimal drainage to the wounds. She does have pain to these areas but has been overall stable. She denies signs of infection. She has visited the ED for this issue and was recently prescribed doxycycline for possible cellulitis. She follows with [facility] for her SLE and is  currently on Plaquenil, prednisone and CellCept. 6/27; patient presents for 1 week follow-up. She states she saw her rheumatologist on 6/22 and prednisone was increased from 10 mg to 20 mg daily. She is currently at the highest dose of Plaquenil and CellCept and is going to try an infusion later today at her regimen. She reports improvement to the wounds. She has been using collagen with dressing changes. She currently denies signs of infection. Readmission 8/26 Patient presents to clinic today for open wounds to her left lower extremity. Her previous wounds that were treated have closed. She states that on 8/9 she developed cellulitis and was treated with 2 rounds of antibiotics. Her cellulitis has resolved. She had blisters that opened and are now large wounds. She has seen her dermatologist and rheumatologist since she has systemic lupus and a hard time healing. No changes have been made to her medications. She was recommended antibiotic ointment by her dermatologist to her wounds She picked up the antibiotic ointment today.. She reports pain to these areas. She denies purulent drainage, increased warmth or erythema to the left lower extremity. She currently keeps the areas covered. Of note she had an Achilles tendon rupture on her right leg and is currently in a soft cast. 9/13; patient presents for follow-up. She missed her last clinic appointment. She has been using Santyl to the wound beds daily. She has no issues or complaints today. She reports the Santyl is helping clean up the wounds. She currently denies signs of infection. 9/22; patient states that 1 week ago she was seen by dermatology and they placed an Unna boot with Bactroban on her left lower extremity. Today she presents with a Unna boot in place. She currently denies signs of infection. 10/7; patient presents for follow-up. She reports improvement. She has been using Santyl to the areas of nonviable tissue and collagen to  granulation tissue. She has been approved  for infusions for her lupus. She is going to start these soon. These will be injection she can do at home. She currently denies signs of infection. 10/21; patient presents for follow-up. She reports continued improvement to her wound healing. She has been using Santyl to all areas except for the superior posterior left leg wound and she is using collagen to this. She has started her injection therapy for lupus. She started 1 week ago. She currently denies signs of infection. 11/3; patient presents for follow-up. She has no issues or complaints today. She is on her third week of injection therapy for lupus and tolerating this well. She has no issues or complaints today. She denies signs of infection. 11/17; patient presents for 2-week follow-up. She reports improvement in wound healing. She denies signs of infection. 12/1; patient presents for follow-up. She has been using collagen to the anterior left lower extremity wounds and Santyl to the left lateral malleolus. She has no issues or complaints today. 12/15; patient presents for follow-up. She is using collagen to the 2 anterior left leg wounds and Santyl to the left lateral malleolus wound. She reports improvement in wound healing. She denies signs of infection. 12/22; patient presents because she is having purulent drainage from her anterior left leg wound. She reports no inciting event and states that the area started draining spontaneously. She was hoping that the drainage would stop however this has not improved. She states this started 5 days ago. She reports tenderness to the wound bed. Electronic Signature(s) Signed: 08/17/2021 11:51:24 AM By: Geralyn Corwin DO Entered By: Geralyn Corwin on 08/17/2021 11:35:30 -------------------------------------------------------------------------------- Physical Exam Details Patient Name: Date of Service: Kelly [name] Kelly [name]. 08/17/2021 10:30 A  M Medical Record Number: [id number] Patient Account Number: [id number] Date of Birth/Sex: Treating RN: 02-17-1996 (25 y.o. [name] Primary Care Provider: Dione Booze Other Clinician: Referring Provider: Treating Provider/Extender: Gennaro Africa in Treatment: 26 Constitutional respirations regular, non-labored and within target range for patient.. Cardiovascular 2+ dorsalis pedis/posterior tibialis pulses. Psychiatric pleasant and cooperative. Notes Left lower extremity: 3 wounds present. 2 wounds to the anterior lower leg with granulation tissue and scant nonviable tissue. The most proximal wound has purulent drainage and increase warmth and erythema to the surrounding skin. The left lateral ankle has granulation tissue with nonviable tissue. No signs of infection. Electronic Signature(s) Signed: 08/17/2021 11:51:24 AM By: Geralyn Corwin DO Entered By: Geralyn Corwin on 08/17/2021 11:36:49 -------------------------------------------------------------------------------- Physician Orders Details Patient Name: Date of Service: Kelly [name] Kelly H M. 08/17/2021 10:30 A M Medical Record Number: [id number] Patient Account Number: [id number] Date of Birth/Sex: Treating RN: Dec 19, 1995 (25 y.o. [name] Primary Care Provider: Dione Booze Other Clinician: Referring Provider: Treating Provider/Extender: Gennaro Africa in Treatment: 26 Verbal / Phone Orders: No Diagnosis Coding ICD-10 Coding Code Description [phone] Non-pressure chronic ulcer of other part of left lower leg with fat layer exposed L97.812 Non-pressure chronic ulcer of other part of right lower leg with fat layer exposed M32.9 Systemic lupus erythematosus, unspecified Follow-up Appointments ppointment in 1 week. - Keep appt for 12/29 with Dr. Mikey Bussing Return A Bathing/ Shower/ Hygiene May shower and wash wound with soap and water. Edema  Control - Lymphedema / SCD / Other Elevate legs to the level of the heart or above for 30 minutes daily and/or when sitting, a frequency of: - 3-4 times a day throughout the day. Avoid standing for long periods of time. Additional Orders / Instructions Follow  Nutritious Diet Wound Treatment Wound #4 - Ankle Wound Laterality: Left, Lateral Cleanser: Soap and Water 1 x Per Day/30 Days Discharge Instructions: May shower and wash wound with dial antibacterial soap and water prior to dressing change. Prim Dressing: Santyl Ointment 1 x Per Day/30 Days ary Discharge Instructions: Apply nickel thick amount to wound bed as instructed Secondary Dressing: Woven Gauze Sponge, Non-Sterile 4x4 in (Generic) 1 x Per Day/30 Days Discharge Instructions: Apply over primary dressing as directed. Secured With: [employer], 4.5x3.1 (in/yd) (Generic) 1 x Per Day/30 Days Discharge Instructions: Secure with Kerlix as directed. Secured With: Paper Tape, 2x10 (in/yd) (Generic) 1 x Per Day/30 Days Discharge Instructions: Secure dressing with tape as directed. Wound #5 - Ankle Wound Laterality: Left, Anterior Cleanser: Soap and Water 1 x Per Day/30 Days Discharge Instructions: May shower and wash wound with dial antibacterial soap and water prior to dressing change. Secondary Dressing: Woven Gauze Sponge, Non-Sterile 4x4 in (Generic) 1 x Per Day/30 Days Discharge Instructions: Apply over primary dressing as directed. Secured With: [employer], 4.5x3.1 (in/yd) (Generic) 1 x Per Day/30 Days Discharge Instructions: Secure with Kerlix as directed. Secured With: Paper Tape, 2x10 (in/yd) (Generic) 1 x Per Day/30 Days Discharge Instructions: Secure dressing with tape as directed. Wound #7 - Lower Leg Wound Laterality: Left, Anterior Cleanser: Soap and Water 1 x Per Day/30 Days Discharge Instructions: May shower and wash wound with dial antibacterial soap and water prior to dressing change. Prim Dressing:  Santyl Ointment 1 x Per Day/30 Days ary Discharge Instructions: Apply nickel thick amount to wound bed as instructed Secondary Dressing: Woven Gauze Sponge, Non-Sterile 4x4 in (Generic) 1 x Per Day/30 Days Discharge Instructions: Apply over primary dressing as directed. Secured With: [employer], 4.5x3.1 (in/yd) (Generic) 1 x Per Day/30 Days Discharge Instructions: Secure with Kerlix as directed. Secured With: Paper Tape, 2x10 (in/yd) (Generic) 1 x Per Day/30 Days Discharge Instructions: Secure dressing with tape as directed. Laboratory naerobe culture (MICRO) - Left anterior lower leg - (ICD10 [phone] - Non-pressure chronic ulcer Bacteria identified in Unspecified specimen by A of other part of left lower leg with fat layer exposed) LOINC Code: 635-3 Convenience Name: Anerobic culture Patient Medications llergies: No Known Allergies A Notifications Medication Indication Start End 08/17/2021 doxycycline hyclate DOSE 1 - oral 100 mg tablet - 1 tablet oral BID x 10 days 08/17/2021 Augmentin DOSE 1 - oral 875 mg-125 mg tablet - 1 tablet oral BID x 10 days Electronic Signature(s) Signed: 08/17/2021 11:49:39 AM By: Geralyn Corwin DO Entered By: Geralyn Corwin on 08/17/2021 11:49:38 -------------------------------------------------------------------------------- Problem List Details Patient Name: Date of Service: Kelly [name] Kelly H M. 08/17/2021 10:30 A M Medical Record Number: [id number] Patient Account Number: [id number] Date of Birth/Sex: Treating RN: 1995/12/26 (25 y.o. [name] Primary Care Provider: Dione Booze Other Clinician: Referring Provider: Treating Provider/Extender: Gennaro Africa in Treatment: 26 Active Problems ICD-10 Encounter Code Description Active Date MDM Diagnosis [id number] Non-pressure chronic ulcer of other part of left lower leg with fat layer exposed11/10/2020 No Yes L97.812 Non-pressure chronic ulcer  of other part of right lower leg with fat layer 06/29/2021 No Yes exposed M32.9 Systemic lupus erythematosus, unspecified 02/14/2021 No Yes Inactive Problems ICD-10 Code Description Active Date Inactive Date L97.819 Non-pressure chronic ulcer of other part of right lower leg with unspecified severity 02/13/2021 02/13/2021 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 with unspecified severity 02/13/2021 02/13/2021 S81.802A Unspecified open wound, left lower leg,  initial encounter 04/21/2021 04/21/2021 S91.002A Unspecified open wound, left ankle, initial encounter 04/21/2021 04/21/2021 Resolved Problems Electronic Signature(s) Signed: 08/17/2021 11:51:24 AM By: Geralyn Corwin DO Entered By: Geralyn Corwin on 08/17/2021 11:32:44 -------------------------------------------------------------------------------- Progress Note Details Patient Name: Date of Service: Kelly [name] Kelly H M. 08/17/2021 10:30 A M Medical Record Number: [id number] Patient Account Number: [id number] Date of Birth/Sex: Treating RN: Jan 03, 1996 (25 y.o. [name] Primary Care Provider: Dione Booze Other Clinician: Referring Provider: Treating Provider/Extender: Gennaro Africa in Treatment: 26 Subjective Chief Complaint Information obtained from Patient Bilateral lower extremity wounds 04/21/21: left lower extremity wound s/p cellulitis History of Present Illness (HPI) Admission 6/21 Ms. Kelly Lisbon is A 25 year old female with a past medical history of systemic lupus erythematosus that presents with bilateral lower extremity wounds that started in April 2022. She has had skin issues in the past where she developed very small wounds but these healed with time. She has never had open wounds like she does on her legs. She currently reports minimal drainage to the wounds. She does have pain to these areas but has been overall stable. She denies signs of infection.  She has visited the ED for this issue and was recently prescribed doxycycline for possible cellulitis. She follows with [facility] for her SLE and is currently on Plaquenil, prednisone and CellCept. 6/27; patient presents for 1 week follow-up. She states she saw her rheumatologist on 6/22 and prednisone was increased from 10 mg to 20 mg daily. She is currently at the highest dose of Plaquenil and CellCept and is going to try an infusion later today at her regimen. She reports improvement to the wounds. She has been using collagen with dressing changes. She currently denies signs of infection. Readmission 8/26 Patient presents to clinic today for open wounds to her left lower extremity. Her previous wounds that were treated have closed. She states that on 8/9 she developed cellulitis and was treated with 2 rounds of antibiotics. Her cellulitis has resolved. She had blisters that opened and are now large wounds. She has seen her dermatologist and rheumatologist since she has systemic lupus and a hard time healing. No changes have been made to her medications. She was recommended antibiotic ointment by her dermatologist to her wounds She picked up the antibiotic ointment today.. She reports pain to these areas. She denies purulent drainage, increased warmth or erythema to the left lower extremity. She currently keeps the areas covered. Of note she had an Achilles tendon rupture on her right leg and is currently in a soft cast. 9/13; patient presents for follow-up. She missed her last clinic appointment. She has been using Santyl to the wound beds daily. She has no issues or complaints today. She reports the Santyl is helping clean up the wounds. She currently denies signs of infection. 9/22; patient states that 1 week ago she was seen by dermatology and they placed an Unna boot with Bactroban on her left lower extremity. Today she presents with a Unna boot in place. She currently denies signs of  infection. 10/7; patient presents for follow-up. She reports improvement. She has been using Santyl to the areas of nonviable tissue and collagen to granulation tissue. She has been approved for infusions for her lupus. She is going to start these soon. These will be injection she can do at home. She currently denies signs of infection. 10/21; patient presents for follow-up. She reports continued improvement to her wound healing. She has been using Santyl to all  areas except for the superior posterior left leg wound and she is using collagen to this. She has started her injection therapy for lupus. She started 1 week ago. She currently denies signs of infection. 11/3; patient presents for follow-up. She has no issues or complaints today. She is on her third week of injection therapy for lupus and tolerating this well. She has no issues or complaints today. She denies signs of infection. 11/17; patient presents for 2-week follow-up. She reports improvement in wound healing. She denies signs of infection. 12/1; patient presents for follow-up. She has been using collagen to the anterior left lower extremity wounds and Santyl to the left lateral malleolus. She has no issues or complaints today. 12/15; patient presents for follow-up. She is using collagen to the 2 anterior left leg wounds and Santyl to the left lateral malleolus wound. She reports improvement in wound healing. She denies signs of infection. 12/22; patient presents because she is having purulent drainage from her anterior left leg wound. She reports no inciting event and states that the area started draining spontaneously. She was hoping that the drainage would stop however this has not improved. She states this started 5 days ago. She reports tenderness to the wound bed. Patient History Information obtained from Patient. Family History Diabetes - Paternal Grandparents, Hypertension - Paternal Grandparents, Thyroid Problems - Maternal  Grandparents,Paternal Grandparents, No family history of Cancer, Heart Disease, Hereditary Spherocytosis, Kidney Disease, Lung Disease, Seizures, Stroke, Tuberculosis. Social History Never smoker, Marital Status - Married, Alcohol Use - Moderate, Drug Use - Current History - Marijuana, Caffeine Use - Daily - Coffee. Medical History Respiratory Patient has history of Asthma Immunological Patient has history of Lupus Erythematosus Objective Constitutional respirations regular, non-labored and within target range for patient.. Vitals Time Taken: 10:56 AM, Height: 62 in, Temperature: 98.6 F, Pulse: 95 bpm, Respiratory Rate: 18 breaths/min, Blood Pressure: 132/89 mmHg. Cardiovascular 2+ dorsalis pedis/posterior tibialis pulses. Psychiatric pleasant and cooperative. General Notes: Left lower extremity: 3 wounds present. 2 wounds to the anterior lower leg with granulation tissue and scant nonviable tissue. The most proximal wound has purulent drainage and increase warmth and erythema to the surrounding skin. The left lateral ankle has granulation tissue with nonviable tissue. No signs of infection. Integumentary (Hair, Skin) Wound #4 status is Open. Original cause of wound was Gradually Appeared. The date acquired was: 04/14/2021. The wound has been in treatment 16 weeks. The wound is located on the Left,Lateral Ankle. The wound measures 2.4cm length x 3.1cm width x 0.1cm depth; 5.843cm^2 area and 0.584cm^3 volume. There is a medium amount of serosanguineous drainage noted. Wound #5 status is Open. Original cause of wound was Gradually Appeared. The date acquired was: 04/14/2021. The wound has been in treatment 16 weeks. The wound is located on the Left,Anterior Ankle. The wound measures 1.5cm length x 0.7cm width x 0.2cm depth; 0.825cm^2 area and 0.165cm^3 volume. There is a medium amount of serosanguineous drainage noted. Wound #7 status is Open. Original cause of wound was Other Lesion. The  date acquired was: 05/18/2021. The wound has been in treatment 13 weeks. The wound is located on the Left,Anterior Lower Leg. The wound measures 0.7cm length x 0.7cm width x 0.2cm depth; 0.385cm^2 area and 0.077cm^3 volume. There is a medium amount of serosanguineous drainage noted. Assessment Active Problems ICD-10 Non-pressure chronic ulcer of other part of left lower leg with fat layer exposed Non-pressure chronic ulcer of other part of right lower leg with fat layer exposed Systemic lupus erythematosus,  unspecified Patient presents today because she has had a 5-day history of purulent drainage from her anterior left leg wound. I will start her on doxycycline and Augmentin. A wound culture was obtained. For now I recommended she use a foam border dressing to this wound. T the remaining wound she can use o Santyl daily. She has follow-up in 1 week. Plan Follow-up Appointments: Return Appointment in 1 week. - Keep appt for 12/29 with Dr. Mikey Bussing Bathing/ Shower/ Hygiene: May shower and wash wound with soap and water. Edema Control - Lymphedema / SCD / Other: Elevate legs to the level of the heart or above for 30 minutes daily and/or when sitting, a frequency of: - 3-4 times a day throughout the day. Avoid standing for long periods of time. Additional Orders / Instructions: Follow Nutritious Diet Laboratory ordered were: Anerobic culture - Left anterior lower leg The following medication(s) was prescribed: doxycycline hyclate oral 100 mg tablet 1 1 tablet oral BID x 10 days starting 08/17/2021 Augmentin oral 875 mg-125 mg tablet 1 1 tablet oral BID x 10 days starting 08/17/2021 WOUND #4: - Ankle Wound Laterality: Left, Lateral Cleanser: Soap and Water 1 x Per Day/30 Days Discharge Instructions: May shower and wash wound with dial antibacterial soap and water prior to dressing change. Prim Dressing: Santyl Ointment 1 x Per Day/30 Days ary Discharge Instructions: Apply nickel thick  amount to wound bed as instructed Secondary Dressing: Woven Gauze Sponge, Non-Sterile 4x4 in (Generic) 1 x Per Day/30 Days Discharge Instructions: Apply over primary dressing as directed. Secured With: [employer], 4.5x3.1 (in/yd) (Generic) 1 x Per Day/30 Days Discharge Instructions: Secure with Kerlix as directed. Secured With: Paper T ape, 2x10 (in/yd) (Generic) 1 x Per Day/30 Days Discharge Instructions: Secure dressing with tape as directed. WOUND #5: - Ankle Wound Laterality: Left, Anterior Cleanser: Soap and Water 1 x Per Day/30 Days Discharge Instructions: May shower and wash wound with dial antibacterial soap and water prior to dressing change. Secondary Dressing: Woven Gauze Sponge, Non-Sterile 4x4 in (Generic) 1 x Per Day/30 Days Discharge Instructions: Apply over primary dressing as directed. Secured With: [employer], 4.5x3.1 (in/yd) (Generic) 1 x Per Day/30 Days Discharge Instructions: Secure with Kerlix as directed. Secured With: Paper T ape, 2x10 (in/yd) (Generic) 1 x Per Day/30 Days Discharge Instructions: Secure dressing with tape as directed. WOUND #7: - Lower Leg Wound Laterality: Left, Anterior Cleanser: Soap and Water 1 x Per Day/30 Days Discharge Instructions: May shower and wash wound with dial antibacterial soap and water prior to dressing change. Prim Dressing: Santyl Ointment 1 x Per Day/30 Days ary Discharge Instructions: Apply nickel thick amount to wound bed as instructed Secondary Dressing: Woven Gauze Sponge, Non-Sterile 4x4 in (Generic) 1 x Per Day/30 Days Discharge Instructions: Apply over primary dressing as directed. Secured With: [employer], 4.5x3.1 (in/yd) (Generic) 1 x Per Day/30 Days Discharge Instructions: Secure with Kerlix as directed. Secured With: Paper T ape, 2x10 (in/yd) (Generic) 1 x Per Day/30 Days Discharge Instructions: Secure dressing with tape as directed. 1. Doxycycline and Augmentin 2. Wound culture 3.  Santyl 4. Follow-up in 1 week Electronic Signature(s) Signed: 08/17/2021 11:51:24 AM By: Geralyn Corwin DO Entered By: Geralyn Corwin on 08/17/2021 11:49:53 -------------------------------------------------------------------------------- HxROS Details Patient Name: Date of Service: Kelly [name] Kelly H M. 08/17/2021 10:30 A M Medical Record Number: [id number] Patient Account Number: [id number] Date of Birth/Sex: Treating RN: 11/18/1995 (25 y.o. [name] Primary Care Provider: Dione Booze Other Clinician: Referring Provider: Treating  Provider/Extender: Gennaro Africa in Treatment: 26 Information Obtained From Patient Respiratory Medical History: Positive for: Asthma Immunological Medical History: Positive for: Lupus Erythematosus Immunizations Pneumococcal Vaccine: Received Pneumococcal Vaccination: No Implantable Devices None Family and Social History Cancer: No; Diabetes: Yes - Paternal Grandparents; Heart Disease: No; Hereditary Spherocytosis: No; Hypertension: Yes - Paternal Grandparents; Kidney Disease: No; Lung Disease: No; Seizures: No; Stroke: No; Thyroid Problems: Yes - Maternal Grandparents,Paternal Grandparents; Tuberculosis: No; Never smoker; Marital Status - Married; Alcohol Use: Moderate; Drug Use: Current History - Marijuana; Caffeine Use: Daily - Coffee; Financial Concerns: No; Food, Clothing or Shelter Needs: No; Support System Lacking: No Electronic Signature(s) Signed: 08/17/2021 11:51:24 AM By: Geralyn Corwin DO Signed: 08/17/2021 4:57:12 PM By: Zandra Abts RN, BSN Entered By: Geralyn Corwin on 08/17/2021 11:35:37 -------------------------------------------------------------------------------- SuperBill Details Patient Name: Date of Service: Kelly [name] Kelly H M. 08/17/2021 Medical Record Number: [id number] Patient Account Number: [id number] Date of Birth/Sex: Treating RN: [birth year] (25 y.o. [name] Primary Care Provider: Dione Booze Other Clinician: Referring Provider: Treating Provider/Extender: Gennaro Africa in Treatment: 25 Diagnosis Coding ICD-10 Codes Code Description [phone] Non-pressure chronic ulcer of other part of left lower leg with fat layer exposed L97.812 Non-pressure chronic ulcer of other part of right lower leg with fat layer exposed M32.9 Systemic lupus erythematosus, unspecified L03.116 Cellulitis of left lower limb Facility Procedures CPT4 Code: [id number] Description: 99214 - WOUND CARE VISIT-LEV 4 EST PT Modifier: Quantity: 1 Physician Procedures : CPT4 Code Description Modifier [id number] 99214 - WC PHYS LEVEL 4 - EST PT ICD-10 Diagnosis Description L03.116 Cellulitis of left lower limb L97.822 Non-pressure chronic ulcer of other part of left lower leg with fat layer exposed M32.9 Systemic lupus  erythematosus, unspecified Quantity: 1 Electronic Signature(s) Signed: 08/17/2021 4:57:12 PM By: Zandra Abts RN, BSN Signed: 08/25/2021 8:18:59 AM By: Geralyn Corwin DO Previous Signature: 08/17/2021 11:51:24 AM Version By: Geralyn Corwin DO Entered By: Zandra Abts on 08/17/2021 16:35:37

## 2021-08-25 NOTE — Progress Notes (Addendum)
Kelly [name], Kelly [name] ([id number]) Visit Report for 08/24/2021 Chief Complaint Document Details Patient Name: Date of Service: Kelly [name], Kelly [name]. 08/24/2021 10:15 A M Medical Record Number: [id number] Patient Account Number: [id number] Date of Birth/Sex: Treating RN: 07-Jun-1996 (25 y.o. Kelly [name] Primary Care Provider: Dione [name] Other Clinician: Referring Provider: Treating Provider/Extender: Kelly [name] in Treatment: 27 Information Obtained from: Patient Chief Complaint Bilateral lower extremity wounds 04/21/21: left lower extremity wound s/p cellulitis Electronic Signature(s) Signed: 08/24/2021 11:05:18 AM By: Kelly Corwin DO Entered By: Kelly [name] on 08/24/2021 10:57:30 -------------------------------------------------------------------------------- Debridement Details Patient Name: Date of Service: Kelly Milling [name] 08/24/2021 10:15 A M Medical Record Number: [id number] Patient Account Number: [id number] Date of Birth/Sex: Treating RN: 1995/12/01 (25 y.o. Kelly [name] Primary Care Provider: Dione [name] Other Clinician: Referring Provider: Treating Provider/Extender: Kelly [name] in Treatment: 27 Debridement Performed for Assessment: Wound #4 Left,Lateral Ankle Performed By: Physician Kelly Corwin, DO Debridement Type: Debridement Level of Consciousness (Pre-procedure): Awake and Alert Pre-procedure Verification/Time Out Yes - 10:48 Taken: Start Time: 10:48 T Area Debrided (L x W): otal 2.5 (cm) x 2.6 (cm) = 6.5 (cm) Tissue and other material debrided: Viable, Non-Viable, Slough, Subcutaneous, Slough Level: Skin/Subcutaneous Tissue Debridement Description: Excisional Instrument: Curette Bleeding: Minimum Hemostasis Achieved: Pressure End Time: 10:51 Procedural Pain: 0 Post Procedural Pain: 0 Response to Treatment: Procedure was tolerated well Level of Consciousness  (Post- Awake and Alert procedure): Post Debridement Measurements of Total Wound Length: (cm) 2.5 Width: (cm) 2.6 Depth: (cm) 0.1 Volume: (cm) 0.511 Character of Wound/Ulcer Post Debridement: Requires Further Debridement Post Procedure Diagnosis Same as Pre-procedure Electronic Signature(s) Signed: 08/24/2021 11:05:18 AM By: Kelly Corwin DO Signed: 08/25/2021 2:54:09 PM By: Kelly Abts RN, BSN Entered By: Kelly [name] on 08/24/2021 10:51:15 -------------------------------------------------------------------------------- HPI Details Patient Name: Date of Service: Kelly Milling [name] 08/24/2021 10:15 A M Medical Record Number: [id number] Patient Account Number: [id number] Date of Birth/Sex: Treating RN: [birth year] (25 y.o. Kelly [name] Primary Care Provider: Dione [name] Other Clinician: Referring Provider: Treating Provider/Extender: Kelly [name] in Treatment: 27 History of Present Illness HPI Description: Admission 6/21 [name] is A 25 year old female with a past medical history of systemic lupus erythematosus that presents with bilateral lower extremity wounds that started in April 2022. She has had skin issues in the past where she developed very small wounds but these healed with time. She has never had open wounds like she does on her legs. She currently reports minimal drainage to the wounds. She does have pain to these areas but has been overall stable. She denies signs of infection. She has visited the ED for this issue and was recently prescribed doxycycline for possible cellulitis. She follows with [facility] for her SLE and is currently on Plaquenil, prednisone and CellCept. 6/27; patient presents for 1 week follow-up. She states she saw her rheumatologist on 6/22 and prednisone was increased from 10 mg to 20 mg daily. She is currently at the highest dose of Plaquenil and CellCept and is going to try an  infusion later today at her regimen. She reports improvement to the wounds. She has been using collagen with dressing changes. She currently denies signs of infection. Readmission 8/26 Patient presents to clinic today for open wounds to her left lower extremity. Her previous wounds that were treated have closed. She states that on 8/9 she developed cellulitis and was treated with 2 rounds of antibiotics. Her cellulitis has  resolved. She had blisters that opened and are now large wounds. She has seen her dermatologist and rheumatologist since she has systemic lupus and a hard time healing. No changes have been made to her medications. She was recommended antibiotic ointment by her dermatologist to her wounds She picked up the antibiotic ointment today.. She reports pain to these areas. She denies purulent drainage, increased warmth or erythema to the left lower extremity. She currently keeps the areas covered. Of note she had an Achilles tendon rupture on her right leg and is currently in a soft cast. 9/13; patient presents for follow-up. She missed her last clinic appointment. She has been using Santyl to the wound beds daily. She has no issues or complaints today. She reports the Santyl is helping clean up the wounds. She currently denies signs of infection. 9/22; patient states that 1 week ago she was seen by dermatology and they placed an Unna boot with Bactroban on her left lower extremity. Today she presents with a Unna boot in place. She currently denies signs of infection. 10/7; patient presents for follow-up. She reports improvement. She has been using Santyl to the areas of nonviable tissue and collagen to granulation tissue. She has been approved for infusions for her lupus. She is going to start these soon. These will be injection she can do at home. She currently denies signs of infection. 10/21; patient presents for follow-up. She reports continued improvement to her wound healing. She  has been using Santyl to all areas except for the superior posterior left leg wound and she is using collagen to this. She has started her injection therapy for lupus. She started 1 week ago. She currently denies signs of infection. 11/3; patient presents for follow-up. She has no issues or complaints today. She is on her third week of injection therapy for lupus and tolerating this well. She has no issues or complaints today. She denies signs of infection. 11/17; patient presents for 2-week follow-up. She reports improvement in wound healing. She denies signs of infection. 12/1; patient presents for follow-up. She has been using collagen to the anterior left lower extremity wounds and Santyl to the left lateral malleolus. She has no issues or complaints today. 12/15; patient presents for follow-up. She is using collagen to the 2 anterior left leg wounds and Santyl to the left lateral malleolus wound. She reports improvement in wound healing. She denies signs of infection. 12/22; patient presents because she is having purulent drainage from her anterior left leg wound. She reports no inciting event and states that the area started draining spontaneously. She was hoping that the drainage would stop however this has not improved. She states this started 5 days ago. She reports tenderness to the wound bed. 12/29; patient presents for follow-up. She was able to take Augmentin for 4 days but did not tolerate the GI side effects. She continues to take doxycycline. She reports improvement in drainage. She no longer has increased warmth to the surrounding wound bed. She ran out of [place] and has been using Vaseline to the wound beds. Electronic Signature(s) Signed: 08/24/2021 11:05:18 AM By: Kelly Corwin DO Signed: 08/24/2021 11:05:18 AM By: Kelly Corwin DO Entered By: Kelly [name] on 08/24/2021  10:58:29 -------------------------------------------------------------------------------- Physical Exam Details Patient Name: Date of Service: Kelly Milling RIA Cherly Anderson. 08/24/2021 10:15 A M Medical Record Number: [id number] Patient Account Number: [id number] Date of Birth/Sex: Treating RN: [birth year] (25 y.o. Kelly [name] Primary Care Provider: Dione [name] Other Clinician: Referring Provider: Treating  Provider/Extender: Kelly [name] in Treatment: 27 Constitutional respirations regular, non-labored and within target range for patient.. Cardiovascular 2+ dorsalis pedis/posterior tibialis pulses. Psychiatric pleasant and cooperative. Notes Left lower extremity: 3 wounds present. 2 wounds to the anterior lower leg with granulation tissue and nonviable tissue present. Both wounds have purulent drainage. This is decreased from previous clinic visit. There is no increased warmth or erythema to the surrounding skin. T the left lateral ankle there is o granulation tissue with nonviable tissue present. No signs of infection to this wound bed. Electronic Signature(s) Signed: 08/24/2021 11:05:18 AM By: Kelly Corwin DO Entered By: Kelly [name] on 08/24/2021 10:59:32 -------------------------------------------------------------------------------- Physician Orders Details Patient Name: Date of Service: Kelly Milling [name] 08/24/2021 10:15 A M Medical Record Number: [id number] Patient Account Number: [id number] Date of Birth/Sex: Treating RN: 02/15/1996 (25 y.o. Kelly [name] Primary Care Provider: Dione [name] Other Clinician: Referring Provider: Treating Provider/Extender: Kelly [name] in Treatment: 27 Verbal / Phone Orders: No Diagnosis Coding ICD-10 Coding Code Description [phone] Non-pressure chronic ulcer of other part of left lower leg with fat layer exposed L97.812 Non-pressure chronic ulcer of other part  of right lower leg with fat layer exposed M32.9 Systemic lupus erythematosus, unspecified L03.116 Cellulitis of left lower limb Follow-up Appointments ppointment in 1 week. - with Dr. Mikey Bussing Return A Bathing/ Shower/ Hygiene May shower and wash wound with soap and water. Edema Control - Lymphedema / SCD / Other Elevate legs to the level of the heart or above for 30 minutes daily and/or when sitting, a frequency of: - 3-4 times a day throughout the day. Avoid standing for long periods of time. Additional Orders / Instructions Follow Nutritious Diet Wound Treatment Wound #4 - Ankle Wound Laterality: Left, Lateral Cleanser: Soap and Water 1 x Per Day/30 Days Discharge Instructions: May shower and wash wound with dial antibacterial soap and water prior to dressing change. Prim Dressing: Dakin's Solution 0.125%, 16 (oz) (DME) (Generic) 1 x Per Day/30 Days ary Discharge Instructions: Moisten gauze with Dakin's solution Secondary Dressing: Woven Gauze Sponge, Non-Sterile 4x4 in (Generic) 1 x Per Day/30 Days Discharge Instructions: Apply over primary dressing as directed. Secured With: [employer], 4.5x3.1 (in/yd) (Generic) 1 x Per Day/30 Days Discharge Instructions: Secure with Kerlix as directed. Secured With: Paper Tape, 2x10 (in/yd) (Generic) 1 x Per Day/30 Days Discharge Instructions: Secure dressing with tape as directed. Wound #5 - Ankle Wound Laterality: Left, Anterior Cleanser: Soap and Water 1 x Per Day/30 Days Discharge Instructions: May shower and wash wound with dial antibacterial soap and water prior to dressing change. Prim Dressing: Dakin's Solution 0.125%, 16 (oz) (DME) (Generic) 1 x Per Day/30 Days ary Discharge Instructions: Moisten gauze with Dakin's solution Secondary Dressing: Woven Gauze Sponge, Non-Sterile 4x4 in (Generic) 1 x Per Day/30 Days Discharge Instructions: Apply over primary dressing as directed. Secured With: [employer], 4.5x3.1 (in/yd)  (Generic) 1 x Per Day/30 Days Discharge Instructions: Secure with Kerlix as directed. Secured With: Paper Tape, 2x10 (in/yd) (Generic) 1 x Per Day/30 Days Discharge Instructions: Secure dressing with tape as directed. Wound #7 - Lower Leg Wound Laterality: Left, Anterior Cleanser: Soap and Water 1 x Per Day/30 Days Discharge Instructions: May shower and wash wound with dial antibacterial soap and water prior to dressing change. Prim Dressing: Dakin's Solution 0.125%, 16 (oz) 1 x Per Day/30 Days ary Discharge Instructions: Moisten gauze with Dakin's solution Secondary Dressing: Woven Gauze Sponge, Non-Sterile 4x4 in (Generic) 1 x  Per Day/30 Days Discharge Instructions: Apply over primary dressing as directed. Secured With: [employer], 4.5x3.1 (in/yd) (Generic) 1 x Per Day/30 Days Discharge Instructions: Secure with Kerlix as directed. Secured With: Paper Tape, 2x10 (in/yd) (Generic) 1 x Per Day/30 Days Discharge Instructions: Secure dressing with tape as directed. Patient Medications llergies: No Known Allergies A Notifications Medication Indication Start End 08/24/2021 Keflex DOSE 1 - oral 500 mg capsule - 1 capsule oral Q6H x 7 days 08/24/2021 Dakin's Solution DOSE 1 - miscellaneous 0.125 % solution - use to moisten gauze for wet to dry dressings Electronic Signature(s) Signed: 08/29/2021 5:47:12 PM By: Kelly Abts RN, BSN Signed: 08/30/2021 9:21:24 AM By: Kelly Corwin DO Previous Signature: 08/24/2021 11:04:07 AM Version By: Kelly Corwin DO Entered By: Kelly [name] on 08/29/2021 16:50:08 -------------------------------------------------------------------------------- Problem List Details Patient Name: Date of Service: Kelly Milling [name] 08/24/2021 10:15 A M Medical Record Number: [id number] Patient Account Number: [id number] Date of Birth/Sex: Treating RN: 01-10-1996 (25 y.o. Kelly [name] Primary Care Provider: Dione [name] Other  Clinician: Referring Provider: Treating Provider/Extender: Kelly [name] in Treatment: 27 Active Problems ICD-10 Encounter Code Description Active Date MDM Diagnosis [phone] Non-pressure chronic ulcer of other part of left lower leg with fat layer exposed11/10/2020 No Yes L97.812 Non-pressure chronic ulcer of other part of right lower leg with fat layer 06/29/2021 No Yes exposed M32.9 Systemic lupus erythematosus, unspecified 02/14/2021 No Yes L03.116 Cellulitis of left lower limb 08/24/2021 No Yes Inactive Problems ICD-10 Code Description Active Date Inactive Date L97.819 Non-pressure chronic ulcer of other part of right lower leg with unspecified severity 02/13/2021 02/13/2021 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 with unspecified severity 02/13/2021 02/13/2021 Q03.474Q Unspecified open wound, left lower leg, initial encounter 04/21/2021 04/21/2021 S91.002A Unspecified open wound, left ankle, initial encounter 04/21/2021 04/21/2021 Resolved Problems Electronic Signature(s) Signed: 08/24/2021 11:05:18 AM By: Kelly Corwin DO Entered By: Kelly [name] on 08/24/2021 10:57:04 -------------------------------------------------------------------------------- Progress Note Details Patient Name: Date of Service: Kelly Milling [name] 08/24/2021 10:15 A M Medical Record Number: [id number] Patient Account Number: [id number] Date of Birth/Sex: Treating RN: [birth year] (25 y.o. Kelly [name] Primary Care Provider: Dione [name] Other Clinician: Referring Provider: Treating Provider/Extender: Kelly [name] in Treatment: 27 Subjective Chief Complaint Information obtained from Patient Bilateral lower extremity wounds 04/21/21: left lower extremity wound s/p cellulitis History of Present Illness (HPI) Admission 6/21 Ms. Afsana Liera is A 25 year old female with a past medical history of systemic lupus  erythematosus that presents with bilateral lower extremity wounds that started in April 2022. She has had skin issues in the past where she developed very small wounds but these healed with time. She has never had open wounds like she does on her legs. She currently reports minimal drainage to the wounds. She does have pain to these areas but has been overall stable. She denies signs of infection. She has visited the ED for this issue and was recently prescribed doxycycline for possible cellulitis. She follows with [facility] for her SLE and is currently on Plaquenil, prednisone and CellCept. 6/27; patient presents for 1 week follow-up. She states she saw her rheumatologist on 6/22 and prednisone was increased from 10 mg to 20 mg daily. She is currently at the highest dose of Plaquenil and CellCept and is going to try an infusion later today at her regimen. She reports improvement to the wounds. She has been using collagen with dressing changes. She currently denies  signs of infection. Readmission 8/26 Patient presents to clinic today for open wounds to her left lower extremity. Her previous wounds that were treated have closed. She states that on 8/9 she developed cellulitis and was treated with 2 rounds of antibiotics. Her cellulitis has resolved. She had blisters that opened and are now large wounds. She has seen her dermatologist and rheumatologist since she has systemic lupus and a hard time healing. No changes have been made to her medications. She was recommended antibiotic ointment by her dermatologist to her wounds She picked up the antibiotic ointment today.. She reports pain to these areas. She denies purulent drainage, increased warmth or erythema to the left lower extremity. She currently keeps the areas covered. Of note she had an Achilles tendon rupture on her right leg and is currently in a soft cast. 9/13; patient presents for follow-up. She missed her last clinic appointment.  She has been using Santyl to the wound beds daily. She has no issues or complaints today. She reports the Santyl is helping clean up the wounds. She currently denies signs of infection. 9/22; patient states that 1 week ago she was seen by dermatology and they placed an Unna boot with Bactroban on her left lower extremity. Today she presents with a Unna boot in place. She currently denies signs of infection. 10/7; patient presents for follow-up. She reports improvement. She has been using Santyl to the areas of nonviable tissue and collagen to granulation tissue. She has been approved for infusions for her lupus. She is going to start these soon. These will be injection she can do at home. She currently denies signs of infection. 10/21; patient presents for follow-up. She reports continued improvement to her wound healing. She has been using Santyl to all areas except for the superior posterior left leg wound and she is using collagen to this. She has started her injection therapy for lupus. She started 1 week ago. She currently denies signs of infection. 11/3; patient presents for follow-up. She has no issues or complaints today. She is on her third week of injection therapy for lupus and tolerating this well. She has no issues or complaints today. She denies signs of infection. 11/17; patient presents for 2-week follow-up. She reports improvement in wound healing. She denies signs of infection. 12/1; patient presents for follow-up. She has been using collagen to the anterior left lower extremity wounds and Santyl to the left lateral malleolus. She has no issues or complaints today. 12/15; patient presents for follow-up. She is using collagen to the 2 anterior left leg wounds and Santyl to the left lateral malleolus wound. She reports improvement in wound healing. She denies signs of infection. 12/22; patient presents because she is having purulent drainage from her anterior left leg wound. She  reports no inciting event and states that the area started draining spontaneously. She was hoping that the drainage would stop however this has not improved. She states this started 5 days ago. She reports tenderness to the wound bed. 12/29; patient presents for follow-up. She was able to take Augmentin for 4 days but did not tolerate the GI side effects. She continues to take doxycycline. She reports improvement in drainage. She no longer has increased warmth to the surrounding wound bed. She ran out of [place] and has been using Vaseline to the wound beds. Patient History Information obtained from Patient. Family History Diabetes - Paternal Grandparents, Hypertension - Paternal Grandparents, Thyroid Problems - Maternal Grandparents,Paternal Grandparents, No family history of Cancer,  Heart Disease, Hereditary Spherocytosis, Kidney Disease, Lung Disease, Seizures, Stroke, Tuberculosis. Social History Never smoker, Marital Status - Married, Alcohol Use - Moderate, Drug Use - Current History - Marijuana, Caffeine Use - Daily - Coffee. Medical History Respiratory Patient has history of Asthma Immunological Patient has history of Lupus Erythematosus Objective Constitutional respirations regular, non-labored and within target range for patient.. Vitals Time Taken: 10:15 AM, Height: 62 in, Temperature: 98.5 F, Pulse: 96 bpm, Respiratory Rate: 16 breaths/min, Blood Pressure: 118/82 mmHg. Cardiovascular 2+ dorsalis pedis/posterior tibialis pulses. Psychiatric pleasant and cooperative. General Notes: Left lower extremity: 3 wounds present. 2 wounds to the anterior lower leg with granulation tissue and nonviable tissue present. Both wounds have purulent drainage. This is decreased from previous clinic visit. There is no increased warmth or erythema to the surrounding skin. T the left lateral ankle o there is granulation tissue with nonviable tissue present. No signs of infection to this wound  bed. Integumentary (Hair, Skin) Wound #4 status is Open. Original cause of wound was Gradually Appeared. The date acquired was: 04/14/2021. The wound has been in treatment 17 weeks. The wound is located on the Left,Lateral Ankle. The wound measures 2.5cm length x 2.6cm width x 0.1cm depth; 5.105cm^2 area and 0.511cm^3 volume. There is Fat Layer (Subcutaneous Tissue) exposed. There is no tunneling or undermining noted. There is a medium amount of serosanguineous drainage noted. The wound margin is flat and intact. There is small (1-33%) pink, pale granulation within the wound bed. There is a large (67-100%) amount of necrotic tissue within the wound bed including Adherent Slough. Wound #5 status is Open. Original cause of wound was Gradually Appeared. The date acquired was: 04/14/2021. The wound has been in treatment 17 weeks. The wound is located on the Left,Anterior Ankle. The wound measures 1.9cm length x 0.8cm width x 0.2cm depth; 1.194cm^2 area and 0.239cm^3 volume. There is Fat Layer (Subcutaneous Tissue) exposed. There is no undermining noted, however, there is tunneling at 12:00 with a maximum distance of 1.4cm. There is a medium amount of serosanguineous drainage noted. The wound margin is flat and intact. There is small (1-33%) pink granulation within the wound bed. There is a large (67-100%) amount of necrotic tissue within the wound bed including Adherent Slough. Wound #7 status is Open. Original cause of wound was Other Lesion. The date acquired was: 05/18/2021. The wound has been in treatment 14 weeks. The wound is located on the Left,Anterior Lower Leg. The wound measures 0.5cm length x 0.5cm width x 0.3cm depth; 0.196cm^2 area and 0.059cm^3 volume. There is Fat Layer (Subcutaneous Tissue) exposed. There is no undermining noted, however, there is tunneling at 10:00 with a maximum distance of 3cm. There is a medium amount of purulent drainage noted. The wound margin is flat and intact. There  is no granulation within the wound bed. There is a large (67- 100%) amount of necrotic tissue within the wound bed including Adherent Slough. Assessment Active Problems ICD-10 Non-pressure chronic ulcer of other part of left lower leg with fat layer exposed Non-pressure chronic ulcer of other part of right lower leg with fat layer exposed Systemic lupus erythematosus, unspecified Cellulitis of left lower limb Patient's wound beds are stable. Patient's symptoms Of purulent drainage, increased warmth and erythema have improved. There is still drainage present. The wound culture grew strep pyogenes sensitive to ceftriaxone and penicillin. Since she is not tolerating Augmentin I will switch her to Keflex. At this time I recommended Dakin's wet-to-dry dressings to the wound beds. I  debrided nonviable tissue to the left lateral ankle. Follow-up in 1 week. Procedures Wound #4 Pre-procedure diagnosis of Wound #4 is a Cellulitis located on the Left,Lateral Ankle . There was a Excisional Skin/Subcutaneous Tissue Debridement with a total area of 6.5 sq cm performed by Kelly Corwin, DO. With the following instrument(s): Curette to remove Viable and Non-Viable tissue/material. Material removed includes Subcutaneous Tissue and Slough and. No specimens were taken. A time out was conducted at 10:48, prior to the start of the procedure. A Minimum amount of bleeding was controlled with Pressure. The procedure was tolerated well with a pain level of 0 throughout and a pain level of 0 following the procedure. Post Debridement Measurements: 2.5cm length x 2.6cm width x 0.1cm depth; 0.511cm^3 volume. Character of Wound/Ulcer Post Debridement requires further debridement. Post procedure Diagnosis Wound #4: Same as Pre-Procedure Plan Follow-up Appointments: Return Appointment in 1 week. - Keep appt for 12/29 with Dr. Mikey Bussing Bathing/ Shower/ Hygiene: May shower and wash wound with soap and water. Edema Control  - Lymphedema / SCD / Other: Elevate legs to the level of the heart or above for 30 minutes daily and/or when sitting, a frequency of: - 3-4 times a day throughout the day. Avoid standing for long periods of time. Additional Orders / Instructions: Follow Nutritious Diet WOUND #4: - Ankle Wound Laterality: Left, Lateral Cleanser: Soap and Water 1 x Per Day/30 Days Discharge Instructions: May shower and wash wound with dial antibacterial soap and water prior to dressing change. Prim Dressing: Dakin's Solution 0.125%, 16 (oz) 1 x Per Day/30 Days ary Discharge Instructions: Moisten gauze with Dakin's solution Secondary Dressing: Woven Gauze Sponge, Non-Sterile 4x4 in (Generic) 1 x Per Day/30 Days Discharge Instructions: Apply over primary dressing as directed. Secured With: [employer], 4.5x3.1 (in/yd) (Generic) 1 x Per Day/30 Days Discharge Instructions: Secure with Kerlix as directed. Secured With: Paper T ape, 2x10 (in/yd) (Generic) 1 x Per Day/30 Days Discharge Instructions: Secure dressing with tape as directed. WOUND #5: - Ankle Wound Laterality: Left, Anterior Cleanser: Soap and Water 1 x Per Day/30 Days Discharge Instructions: May shower and wash wound with dial antibacterial soap and water prior to dressing change. Prim Dressing: Dakin's Solution 0.125%, 16 (oz) 1 x Per Day/30 Days ary Discharge Instructions: Moisten gauze with Dakin's solution Secondary Dressing: Woven Gauze Sponge, Non-Sterile 4x4 in (Generic) 1 x Per Day/30 Days Discharge Instructions: Apply over primary dressing as directed. Secured With: [employer], 4.5x3.1 (in/yd) (Generic) 1 x Per Day/30 Days Discharge Instructions: Secure with Kerlix as directed. Secured With: Paper T ape, 2x10 (in/yd) (Generic) 1 x Per Day/30 Days Discharge Instructions: Secure dressing with tape as directed. WOUND #7: - Lower Leg Wound Laterality: Left, Anterior Cleanser: Soap and Water 1 x Per Day/30 Days Discharge  Instructions: May shower and wash wound with dial antibacterial soap and water prior to dressing change. Prim Dressing: Dakin's Solution 0.125%, 16 (oz) 1 x Per Day/30 Days ary Discharge Instructions: Moisten gauze with Dakin's solution Secondary Dressing: Woven Gauze Sponge, Non-Sterile 4x4 in (Generic) 1 x Per Day/30 Days Discharge Instructions: Apply over primary dressing as directed. Secured With: [employer], 4.5x3.1 (in/yd) (Generic) 1 x Per Day/30 Days Discharge Instructions: Secure with Kerlix as directed. Secured With: Paper T ape, 2x10 (in/yd) (Generic) 1 x Per Day/30 Days Discharge Instructions: Secure dressing with tape as directed. 1. Keflex 2. Dakin's wet-to-dry 3. In office sharp debridement 4. Follow-up in 1 week Electronic Signature(s) Signed: 08/24/2021 11:05:18 AM By: Mikey Bussing,  Shanda Bumps DO Entered By: Kelly [name] on 08/24/2021 11:02:17 -------------------------------------------------------------------------------- HxROS Details Patient Name: Date of Service: KAMAIYAH, USELTON. 08/24/2021 10:15 A M Medical Record Number: [id number] Patient Account Number: [id number] Date of Birth/Sex: Treating RN: 01-10-1996 (25 y.o. Kelly [name] Primary Care Provider: Dione [name] Other Clinician: Referring Provider: Treating Provider/Extender: Kelly [name] in Treatment: 27 Information Obtained From Patient Respiratory Medical History: Positive for: Asthma Immunological Medical History: Positive for: Lupus Erythematosus Immunizations Pneumococcal Vaccine: Received Pneumococcal Vaccination: No Implantable Devices None Family and Social History Cancer: No; Diabetes: Yes - Paternal Grandparents; Heart Disease: No; Hereditary Spherocytosis: No; Hypertension: Yes - Paternal Grandparents; Kidney Disease: No; Lung Disease: No; Seizures: No; Stroke: No; Thyroid Problems: Yes - Maternal Grandparents,Paternal Grandparents;  Tuberculosis: No; Never smoker; Marital Status - Married; Alcohol Use: Moderate; Drug Use: Current History - Marijuana; Caffeine Use: Daily - Coffee; Financial Concerns: No; Food, Clothing or Shelter Needs: No; Support System Lacking: No Electronic Signature(s) Signed: 08/24/2021 11:05:18 AM By: Kelly Corwin DO Signed: 08/25/2021 2:54:09 PM By: Kelly Abts RN, BSN Entered By: Kelly [name] on 08/24/2021 10:58:34 -------------------------------------------------------------------------------- SuperBill Details Patient Name: Date of Service: Kelly Milling [name] 08/24/2021 Medical Record Number: [id number] Patient Account Number: [id number] Date of Birth/Sex: Treating RN: 01-26-1996 (25 y.o. Kelly [name] Primary Care Provider: Dione [name] Other Clinician: Referring Provider: Treating Provider/Extender: Kelly [name] in Treatment: 27 Diagnosis Coding ICD-10 Codes Code Description [phone] Non-pressure chronic ulcer of other part of left lower leg with fat layer exposed L97.812 Non-pressure chronic ulcer of other part of right lower leg with fat layer exposed M32.9 Systemic lupus erythematosus, unspecified L03.116 Cellulitis of left lower limb Facility Procedures CPT4 Code: [id number] Description: 11042 - DEB SUBQ TISSUE 20 SQ CM/< ICD-10 Diagnosis Description L97.822 Non-pressure chronic ulcer of other part of left lower leg with fat layer expo Modifier: sed Quantity: 1 Physician Procedures : CPT4 Code Description Modifier [id number] - WC PHYS LEVEL 3 - EST PT ICD-10 Diagnosis Description L03.116 Cellulitis of left lower limb L97.822 Non-pressure chronic ulcer of other part of left lower leg with fat layer exposed Quantity: 1 : [id number] 11042 - WC PHYS SUBQ TISS 20 SQ CM ICD-10 Diagnosis Description L97.822 Non-pressure chronic ulcer of other part of left lower leg with fat layer exposed Quantity: 1 Electronic Signature(s) Signed:  08/24/2021 11:05:18 AM By: Kelly Corwin DO Entered By: Kelly [name] on 08/24/2021 11:04:46

## 2021-09-01 ENCOUNTER — Other Ambulatory Visit: Payer: Self-pay

## 2021-09-01 ENCOUNTER — Encounter (HOSPITAL_BASED_OUTPATIENT_CLINIC_OR_DEPARTMENT_OTHER): Payer: BC Managed Care – PPO | Attending: Internal Medicine | Admitting: Internal Medicine

## 2021-09-01 DIAGNOSIS — L97822 Non-pressure chronic ulcer of other part of left lower leg with fat layer exposed: Secondary | ICD-10-CM | POA: Insufficient documentation

## 2021-09-01 DIAGNOSIS — L03116 Cellulitis of left lower limb: Secondary | ICD-10-CM | POA: Insufficient documentation

## 2021-09-01 DIAGNOSIS — M329 Systemic lupus erythematosus, unspecified: Secondary | ICD-10-CM | POA: Diagnosis not present

## 2021-09-01 DIAGNOSIS — L97812 Non-pressure chronic ulcer of other part of right lower leg with fat layer exposed: Secondary | ICD-10-CM | POA: Insufficient documentation

## 2021-09-01 NOTE — Progress Notes (Addendum)
GIDGET, QUIZHPI ([id number]) Visit Report for 09/01/2021 Arrival Information Details Patient Name: Date of Service: Kelly [name], Kelly [name]. 09/01/2021 10:15 A M Medical Record Number: [id number] Patient Account Number: [id number] Date of Birth/Sex: Treating RN: [birth year] (27 y.o. Elam Dutch Primary Care Azlynn Mitnick: Kelly [name] Other Clinician: Referring Trilby Way: Treating Kanyla Omeara/Extender: Elsworth Soho in Treatment: 28 Visit Information History Since Last Visit Added or deleted any medications: No Patient Arrived: Ambulatory Any new allergies or adverse reactions: No Arrival Time: 10:26 Had a fall or experienced change in No Accompanied By: spouse activities of daily living that may affect Transfer Assistance: None risk of falls: Patient Identification Verified: Yes Signs or symptoms of abuse/neglect since last visito No Secondary Verification Process Completed: Yes Hospitalized since last visit: No Patient Requires Transmission-Based Precautions: No Implantable device outside of the clinic excluding No Patient Has Alerts: Yes cellular tissue based products placed in the center Patient Alerts: Left ABi=1.18 since last visit: Has Dressing in Place as Prescribed: Yes Pain Present Now: Yes Electronic Signature(s) Signed: 09/01/2021 11:36:31 AM By: Baruch Gouty RN, BSN Entered By: Baruch Gouty on 09/01/2021 10:31:04 -------------------------------------------------------------------------------- Encounter Discharge Information Details Patient Name: Date of Service: Kelly [name] Kelly H M. 09/01/2021 10:15 A M Medical Record Number: [id number] Patient Account Number: [id number] Date of Birth/Sex: Treating RN: 28-Oct-1995 (26 y.o. Elam Dutch Primary Care Naim Murtha: Kelly [name] Other Clinician: Referring Nikitas Davtyan: Treating Ariyannah Pauling/Extender: Elsworth Soho in Treatment: 28 Encounter Discharge Information  Items Post Procedure Vitals Discharge Condition: Stable Temperature (F): 98.2 Ambulatory Status: Ambulatory Pulse (bpm): 88 Discharge Destination: Home Respiratory Rate (breaths/min): 18 Transportation: Private Auto Blood Pressure (mmHg): 119/84 Accompanied By: spouse Schedule Follow-up Appointment: Yes Clinical Summary of Care: Patient Declined Electronic Signature(s) Signed: 09/01/2021 11:36:31 AM By: Baruch Gouty RN, BSN Entered By: Baruch Gouty on 09/01/2021 11:21:09 -------------------------------------------------------------------------------- Lower Extremity Assessment Details Patient Name: Date of Service: Kelly [name] Kelly H M. 09/01/2021 10:15 A M Medical Record Number: [id number] Patient Account Number: [id number] Date of Birth/Sex: Treating RN: [birth year] (25 y.o. Elam Dutch Primary Care Ellarose Brandi: Kelly [name] Other Clinician: Referring Leeya Rusconi: Treating Dalissa Lovin/Extender: Elsworth Soho in Treatment: 28 Edema Assessment Assessed: Shirlyn Goltz: No] [Right: No] Edema: [Left: No] [Right: No] Calf Left: Right: Point of Measurement: 27 cm From Medial Instep 34 cm 33 cm Ankle Left: Right: Point of Measurement: 8 cm From Medial Instep 22 cm 22 cm Vascular Assessment Pulses: Dorsalis Pedis Palpable: [Left:Yes] [Right:Yes] Electronic Signature(s) Signed: 09/01/2021 11:36:31 AM By: Baruch Gouty RN, BSN Entered By: Baruch Gouty on 09/01/2021 10:35:24 -------------------------------------------------------------------------------- Multi Wound Chart Details Patient Name: Date of Service: Kelly [name] Kelly H M. 09/01/2021 10:15 A M Medical Record Number: [id number] Patient Account Number: [id number] Date of Birth/Sex: Treating RN: 30-Sep-1995 (26 y.o. [name], [name] Primary Care Brayley Mackowiak: Kelly [name] Other Clinician: Referring Tessica Cupo: Treating Keelan Tripodi/Extender: Elsworth Soho in Treatment:  28 Vital Signs Height(in): 62 Pulse(bpm): 73 Weight(lbs): Blood Pressure(mmHg): 119/84 Body Mass Index(BMI): Temperature(F): 98.2 Respiratory Rate(breaths/min): 18 Photos: [4:Left, Lateral Ankle] [5:Left, Anterior Ankle] [7:Left, Anterior Lower Leg] Wound Location: [4:Gradually Appeared] [5:Gradually Appeared] [7:Other Lesion] Wounding Event: [4:Cellulitis] [5:Cellulitis] [7:Lupus] Primary Etiology: [4:Lupus] [5:Lupus] [7:N/A] Secondary Etiology: [4:Asthma, Lupus Erythematosus] [5:Asthma, Lupus Erythematosus] [7:Asthma, Lupus Erythematosus] Comorbid History: [4:04/14/2021] [5:04/14/2021] [7:05/18/2021] Date Acquired: [4:19] [5:19] [7:15] Weeks of Treatment: [4:Open] [5:Open] [7:Open] Wound Status: [4:1.8x1.7x0.1] [5:1.2x0.4x0.2] [7:0.2x0.2x0.1] Measurements L x W x D (cm) [4:2.403] [5:0.377] [7:0.031] A (cm) : rea [4:0.24] [5:0.075] [7:0.003] Volume (cm) : [  4:84.70%] [5:60.00%] [7:97.50%] % Reduction in A rea: [4:96.20%] [5:60.10%] [7:99.20%] % Reduction in Volume: [7:11] Position 1 (o'clock): [7:1.3] Maximum Distance 1 (cm): [4:No] [5:No] [7:Yes] Tunneling: [4:Full Thickness Without Exposed] [5:Full Thickness Without Exposed] [7:Full Thickness Without Exposed] Classification: [4:Support Structures Medium] [5:Support Structures Medium] [7:Support Structures Medium] Exudate A mount: [4:Serosanguineous] [5:Serosanguineous] [7:Serous] Exudate Type: [4:red, brown] [5:red, brown] [7:amber] Exudate Color: [4:Distinct, outline attached] [5:Flat and Intact] [7:Flat and Intact] Wound Margin: [4:Large (67-100%)] [5:Large (67-100%)] [7:Small (1-33%)] Granulation A mount: [4:Red, Pink] [5:Red, Pink] [7:Pink] Granulation Quality: [4:Small (1-33%)] [5:None Present (0%)] [7:None Present (0%)] Necrotic A mount: [4:Adherent Slough] [5:N/A] [7:N/A] Necrotic Tissue: [4:Fat Layer (Subcutaneous Tissue): Yes Fat Layer (Subcutaneous Tissue): Yes Fat Layer (Subcutaneous Tissue): Yes] Exposed  Structures: [4:Fascia: No Tendon: No Muscle: No Joint: No Bone: No None] [5:Fascia: No Tendon: No Muscle: No Joint: No Bone: No Small (1-33%)] [7:Fascia: No Tendon: No Muscle: No Joint: No Bone: No Small (1-33%)] Epithelialization: [4:Chemical/Enzymatic/Mechanical] [5:N/A] [7:N/A] Debridement: [4:N/A] [5:N/A] [7:N/A] Instrument: [4:None] [5:N/A] [7:N/A] Bleeding: Debridement Treatment Response: Procedure was tolerated well [5:N/A] [7:N/A] Post Debridement Measurements L x 1.8x1.7x0.1 [5:N/A] [7:N/A] W x D (cm) [4:0.24] [5:N/A] [7:N/A] Post Debridement Volume: (cm) [4:Debridement] [5:N/A] [7:N/A] Wound Number: 8 N/A N/A Photos: N/A N/A Right, Anterior Lower Leg N/A N/A Wound Location: Gradually Appeared N/A N/A Wounding Event: Lupus N/A N/A Primary Etiology: N/A N/A N/A Secondary Etiology: Asthma, Lupus Erythematosus N/A N/A Comorbid History: 08/28/2021 N/A N/A Date Acquired: 0 N/A N/A Weeks of Treatment: Open N/A N/A Wound Status: 1.3x0.5x0.1 N/A N/A Measurements L x W x D (cm) 0.511 N/A N/A A (cm) : rea 0.051 N/A N/A Volume (cm) : 0.00% N/A N/A % Reduction in A rea: 0.00% N/A N/A % Reduction in Volume: No N/A N/A Tunneling: Full Thickness Without Exposed N/A N/A Classification: Support Structures Medium N/A N/A Exudate A mount: Serous N/A N/A Exudate Type: amber N/A N/A Exudate Color: Flat and Intact N/A N/A Wound Margin: None Present (0%) N/A N/A Granulation Amount: N/A N/A N/A Granulation Quality: Large (67-100%) N/A N/A Necrotic Amount: Eschar, Adherent Slough N/A N/A Necrotic Tissue: Fat Layer (Subcutaneous Tissue): Yes N/A N/A Exposed Structures: Fascia: No Tendon: No Muscle: No Joint: No Bone: No None N/A N/A Epithelialization: Chemical/Enzymatic/Mechanical N/A N/A Debridement: N/A N/A N/A Instrument: None N/A N/A Bleeding: Procedure was tolerated well N/A N/A Debridement Treatment Response: 1.3x0.5x0.1 N/A N/A Post Debridement  Measurements L x W x D (cm) 0.051 N/A N/A Post Debridement Volume: (cm) Debridement N/A N/A Procedures Performed: Treatment Notes Wound #4 (Ankle) Wound Laterality: Left, Lateral Cleanser Soap and Water Discharge Instruction: May shower and wash wound with dial antibacterial soap and water prior to dressing change. Peri-Wound Care Topical Primary Dressing Santyl Ointment Discharge Instruction: Apply nickel thick amount to wound bed as instructed Secondary Dressing Woven Gauze Sponge, Non-Sterile 4x4 in Discharge Instruction: Apply over primary dressing as directed. Secured With [employer], 4.5x3.1 (in/yd) Discharge Instruction: Secure with Kerlix as directed. Paper Tape, 2x10 (in/yd) Discharge Instruction: Secure dressing with tape as directed. Compression Wrap Compression Stockings Add-Ons Wound #5 (Ankle) Wound Laterality: Left, Anterior Cleanser Soap and Water Discharge Instruction: May shower and wash wound with dial antibacterial soap and water prior to dressing change. Peri-Wound Care Topical Primary Dressing Promogran Prisma Matrix, 4.34 (sq in) (silver collagen) Discharge Instruction: Moisten collagen with saline or hydrogel Secondary Dressing Woven Gauze Sponge, Non-Sterile 4x4 in Discharge Instruction: Apply over primary dressing as directed. Secured With [employer], 4.5x3.1 (in/yd) Discharge Instruction: Secure with  Kerlix as directed. Paper Tape, 2x10 (in/yd) Discharge Instruction: Secure dressing with tape as directed. Compression Wrap Compression Stockings Add-Ons Wound #7 (Lower Leg) Wound Laterality: Left, Anterior Cleanser Soap and Water Discharge Instruction: May shower and wash wound with dial antibacterial soap and water prior to dressing change. Peri-Wound Care Topical Triple Antibiotic Ointment, 1 (oz) Tube Discharge Instruction: thin layer to wound bed Primary Dressing Secondary Dressing Woven Gauze Sponge, Non-Sterile  4x4 in Discharge Instruction: Apply over primary dressing as directed. Secured With [employer], 4.5x3.1 (in/yd) Discharge Instruction: Secure with Kerlix as directed. Paper Tape, 2x10 (in/yd) Discharge Instruction: Secure dressing with tape as directed. Compression Wrap Compression Stockings Add-Ons Wound #8 (Lower Leg) Wound Laterality: Right, Anterior Cleanser Peri-Wound Care Topical Primary Dressing Santyl Ointment Discharge Instruction: Apply nickel thick amount to wound bed as instructed Secondary Dressing Woven Gauze Sponge, Non-Sterile 4x4 in Discharge Instruction: Apply over primary dressing as directed. Secured With Conforming Stretch Gauze Bandage, Sterile 2x75 (in/in) Discharge Instruction: Secure with stretch gauze as directed. Compression Wrap Compression Stockings Add-Ons Electronic Signature(s) Signed: 09/01/2021 12:18:20 PM By: Kalman Shan DO Signed: 09/05/2021 5:43:46 PM By: Baruch Gouty RN, BSN Entered By: Kalman Shan on 09/01/2021 12:08:18 -------------------------------------------------------------------------------- Supreme Details Patient Name: Date of Service: Kelly [name] Kelly H M. 09/01/2021 10:15 A M Medical Record Number: [id number] Patient Account Number: [id number] Date of Birth/Sex: Treating RN: [birth year] (26 y.o. Elam Dutch Primary Care Nayra Coury: Kelly [name] Other Clinician: Referring Joyanne Eddinger: Treating Juniper Snyders/Extender: Elsworth Soho in Treatment: [place] reviewed with physician Active Inactive Wound/Skin Impairment Nursing Diagnoses: Impaired tissue integrity Goals: Patient/caregiver will verbalize understanding of skin care regimen Date Initiated: 02/13/2021 Target Resolution Date: 09/15/2021 Goal Status: Active Ulcer/skin breakdown will have a volume reduction of 30% by week 4 Date Initiated: 02/13/2021 Date Inactivated:  04/21/2021 Target Resolution Date: 03/13/2021 Unmet Reason: pt did not return to Goal Status: Unmet clinic for F/U Ulcer/skin breakdown will have a volume reduction of 80% by week 12 Date Initiated: 04/21/2021 Date Inactivated: 07/13/2021 Target Resolution Date: 07/27/2021 Goal Status: Met Interventions: Assess patient/caregiver ability to obtain necessary supplies Assess patient/caregiver ability to perform ulcer/skin care regimen upon admission and as needed Assess ulceration(s) every visit Provide education on ulcer and skin care Treatment Activities: Skin care regimen initiated : 02/13/2021 Topical wound management initiated : 02/13/2021 Notes: 06/29/21: Wounds 3 and 6 only at 80% volume reduction. Target date extended. Electronic Signature(s) Signed: 09/01/2021 11:36:31 AM By: Baruch Gouty RN, BSN Entered By: Baruch Gouty on 09/01/2021 10:56:50 -------------------------------------------------------------------------------- Pain Assessment Details Patient Name: Date of Service: Kelly [name] Kelly Cipriano Bunker. 09/01/2021 10:15 A M Medical Record Number: [id number] Patient Account Number: [id number] Date of Birth/Sex: Treating RN: [birth year] (26 y.o. Elam Dutch Primary Care Desmund Elman: Kelly [name] Other Clinician: Referring Clarine Elrod: Treating Rmani Kapusta/Extender: Elsworth Soho in Treatment: 28 Active Problems Location of Pain Severity and Description of Pain Patient Has Paino Yes Site Locations Pain Location: Pain in Ulcers With Dressing Change: Yes Duration of the Pain. Constant / Intermittento Intermittent Rate the pain. Current Pain Level: 10 Worst Pain Level: 10 Least Pain Level: 8 Character of Pain Describe the Pain: Aching, Sharp Pain Management and Medication Current Pain Management: Medication: Yes Is the Current Pain Management Adequate: Adequate How does your wound impact your activities of daily livingo Sleep: Yes Bathing:  No Appetite: No Relationship With Others: No Bladder Continence: No Emotions: Yes Bowel Continence: No Work: No Toileting: No Drive: No Dressing:  No Hobbies: No Electronic Signature(s) Signed: 09/01/2021 11:36:31 AM By: Baruch Gouty RN, BSN Entered By: Baruch Gouty on 09/01/2021 10:30:50 -------------------------------------------------------------------------------- Patient/Caregiver Education Details Patient Name: Date of Service: Kelly [name] 1/6/2023andnbsp10:15 Kelly [name] Record Number: [id number] Patient Account Number: [id number] Date of Birth/Gender: Treating RN: 1996/03/04 (26 y.o. Elam Dutch Primary Care Physician: Kelly [name] Other Clinician: Referring Physician: Treating Physician/Extender: Elsworth Soho in Treatment: 28 Education Assessment Education Provided To: Patient Education Topics Provided Infection: Methods: Explain/Verbal Responses: Reinforcements needed, State content correctly Wound/Skin Impairment: Methods: Explain/Verbal Responses: Reinforcements needed, State content correctly Electronic Signature(s) Signed: 09/01/2021 11:36:31 AM By: Baruch Gouty RN, BSN Entered By: Baruch Gouty on 09/01/2021 10:57:21 -------------------------------------------------------------------------------- Wound Assessment Details Patient Name: Date of Service: Kelly [name] Kelly Cipriano Bunker. 09/01/2021 10:15 A M Medical Record Number: [id number] Patient Account Number: [id number] Date of Birth/Sex: Treating RN: March 18, 1996 (26 y.o. [name], [name] Primary Care Zackariah Vanderpol: Kelly [name] Other Clinician: Referring Asir Bingley: Treating Cian Costanzo/Extender: Elsworth Soho in Treatment: 28 Wound Status Wound Number: 4 Primary Etiology: Cellulitis Wound Location: Left, Lateral Ankle Secondary Etiology: Lupus Wounding Event: Gradually Appeared Wound Status: Open Date Acquired:  04/14/2021 Comorbid History: Asthma, Lupus Erythematosus Weeks Of Treatment: 19 Clustered Wound: No Photos Wound Measurements Length: (cm) 1.8 Width: (cm) 1.7 Depth: (cm) 0.1 Area: (cm) 2.403 Volume: (cm) 0.24 % Reduction in Area: 84.7% % Reduction in Volume: 96.2% Epithelialization: None Tunneling: No Undermining: No Wound Description Classification: Full Thickness Without Exposed Support Structures Wound Margin: Distinct, outline attached Exudate Amount: Medium Exudate Type: Serosanguineous Exudate Color: red, brown Foul Odor After Cleansing: No Slough/Fibrino No Wound Bed Granulation Amount: Large (67-100%) Exposed Structure Granulation Quality: Red, Pink Fascia Exposed: No Necrotic Amount: Small (1-33%) Fat Layer (Subcutaneous Tissue) Exposed: Yes Necrotic Quality: Adherent Slough Tendon Exposed: No Muscle Exposed: No Joint Exposed: No Bone Exposed: No Treatment Notes Wound #4 (Ankle) Wound Laterality: Left, Lateral Cleanser Soap and Water Discharge Instruction: May shower and wash wound with dial antibacterial soap and water prior to dressing change. Peri-Wound Care Topical Primary Dressing Santyl Ointment Discharge Instruction: Apply nickel thick amount to wound bed as instructed Secondary Dressing Woven Gauze Sponge, Non-Sterile 4x4 in Discharge Instruction: Apply over primary dressing as directed. Secured With [employer], 4.5x3.1 (in/yd) Discharge Instruction: Secure with Kerlix as directed. Paper Tape, 2x10 (in/yd) Discharge Instruction: Secure dressing with tape as directed. Compression Wrap Compression Stockings Add-Ons Electronic Signature(s) Signed: 09/01/2021 11:36:31 AM By: Baruch Gouty RN, BSN Entered By: Baruch Gouty on 09/01/2021 10:44:55 -------------------------------------------------------------------------------- Wound Assessment Details Patient Name: Date of Service: Kelly [name] Kelly Cipriano Bunker. 09/01/2021 10:15 A M Medical  Record Number: [id number] Patient Account Number: [id number] Date of Birth/Sex: Treating RN: [birth year] (26 y.o. [name], [name] Primary Care Everhett Bozard: Kelly [name] Other Clinician: Referring Hoby Kawai: Treating Jaden Batchelder/Extender: Elsworth Soho in Treatment: 28 Wound Status Wound Number: 5 Primary Etiology: Cellulitis Wound Location: Left, Anterior Ankle Secondary Etiology: Lupus Wounding Event: Gradually Appeared Wound Status: Open Date Acquired: 04/14/2021 Comorbid History: Asthma, Lupus Erythematosus Weeks Of Treatment: 19 Clustered Wound: No Photos Wound Measurements Length: (cm) 1.2 Width: (cm) 0.4 Depth: (cm) 0.2 Area: (cm) 0.377 Volume: (cm) 0.075 % Reduction in Area: 60% % Reduction in Volume: 60.1% Epithelialization: Small (1-33%) Tunneling: No Undermining: No Wound Description Classification: Full Thickness Without Exposed Support Structures Wound Margin: Flat and Intact Exudate Amount: Medium Exudate Type: Serosanguineous Exudate Color: red, brown Foul Odor After Cleansing: No Slough/Fibrino Yes Wound Bed Granulation  Amount: Large (67-100%) Exposed Structure Granulation Quality: Red, Pink Fascia Exposed: No Necrotic Amount: None Present (0%) Fat Layer (Subcutaneous Tissue) Exposed: Yes Tendon Exposed: No Muscle Exposed: No Joint Exposed: No Bone Exposed: No Treatment Notes Wound #5 (Ankle) Wound Laterality: Left, Anterior Cleanser Soap and Water Discharge Instruction: May shower and wash wound with dial antibacterial soap and water prior to dressing change. Peri-Wound Care Topical Primary Dressing Promogran Prisma Matrix, 4.34 (sq in) (silver collagen) Discharge Instruction: Moisten collagen with saline or hydrogel Secondary Dressing Woven Gauze Sponge, Non-Sterile 4x4 in Discharge Instruction: Apply over primary dressing as directed. Secured With [employer], 4.5x3.1 (in/yd) Discharge Instruction:  Secure with Kerlix as directed. Paper Tape, 2x10 (in/yd) Discharge Instruction: Secure dressing with tape as directed. Compression Wrap Compression Stockings Add-Ons Electronic Signature(s) Signed: 09/01/2021 11:36:31 AM By: Baruch Gouty RN, BSN Entered By: Baruch Gouty on 09/01/2021 10:45:49 -------------------------------------------------------------------------------- Wound Assessment Details Patient Name: Date of Service: Kelly [name] Kelly Cipriano Bunker. 09/01/2021 10:15 A M Medical Record Number: [id number] Patient Account Number: [id number] Date of Birth/Sex: Treating RN: 1995/09/18 (26 y.o. [name], [name] Primary Care Claudie Brickhouse: Kelly [name] Other Clinician: Referring Djon Tith: Treating Caven Perine/Extender: Elsworth Soho in Treatment: 28 Wound Status Wound Number: 7 Primary Etiology: Lupus Wound Location: Left, Anterior Lower Leg Wound Status: Open Wounding Event: Other Lesion Comorbid History: Asthma, Lupus Erythematosus Date Acquired: 05/18/2021 Weeks Of Treatment: 15 Clustered Wound: No Photos Wound Measurements Length: (cm) 0.2 Width: (cm) 0.2 Depth: (cm) 0.1 Area: (cm) 0.031 Volume: (cm) 0.003 % Reduction in Area: 97.5% % Reduction in Volume: 99.2% Epithelialization: Small (1-33%) Tunneling: Yes Position (o'clock): 11 Maximum Distance: (cm) 1.3 Undermining: No Wound Description Classification: Full Thickness Without Exposed Support Structu Wound Margin: Flat and Intact Exudate Amount: Medium Exudate Type: Serous Exudate Color: amber res Foul Odor After Cleansing: No Slough/Fibrino No Wound Bed Granulation Amount: Small (1-33%) Exposed Structure Granulation Quality: Pink Fascia Exposed: No Necrotic Amount: None Present (0%) Fat Layer (Subcutaneous Tissue) Exposed: Yes Tendon Exposed: No Muscle Exposed: No Joint Exposed: No Bone Exposed: No Treatment Notes Wound #7 (Lower Leg) Wound Laterality: Left,  Anterior Cleanser Soap and Water Discharge Instruction: May shower and wash wound with dial antibacterial soap and water prior to dressing change. Peri-Wound Care Topical Triple Antibiotic Ointment, 1 (oz) Tube Discharge Instruction: thin layer to wound bed Primary Dressing Secondary Dressing Woven Gauze Sponge, Non-Sterile 4x4 in Discharge Instruction: Apply over primary dressing as directed. Secured With [employer], 4.5x3.1 (in/yd) Discharge Instruction: Secure with Kerlix as directed. Paper Tape, 2x10 (in/yd) Discharge Instruction: Secure dressing with tape as directed. Compression Wrap Compression Stockings Add-Ons Electronic Signature(s) Signed: 09/01/2021 11:36:31 AM By: Baruch Gouty RN, BSN Entered By: Baruch Gouty on 09/01/2021 10:46:39 -------------------------------------------------------------------------------- Wound Assessment Details Patient Name: Date of Service: Kelly [name] Kelly Cipriano Bunker. 09/01/2021 10:15 A M Medical Record Number: [id number] Patient Account Number: [id number] Date of Birth/Sex: Treating RN: 05-16-1996 (25 y.o. Elam Dutch Primary Care Austynn Pridmore: Kelly [name] Other Clinician: Referring Rasheen Schewe: Treating Rainelle Sulewski/Extender: Elsworth Soho in Treatment: 28 Wound Status Wound Number: 8 Primary Etiology: Lupus Wound Location: Right, Anterior Lower Leg Wound Status: Open Wounding Event: Gradually Appeared Comorbid History: Asthma, Lupus Erythematosus Date Acquired: 08/28/2021 Weeks Of Treatment: 0 Clustered Wound: No Photos Wound Measurements Length: (cm) 1.3 Width: (cm) 0.5 Depth: (cm) 0.1 Area: (cm) 0.511 Volume: (cm) 0.051 % Reduction in Area: 0% % Reduction in Volume: 0% Epithelialization: None Tunneling: No Undermining: No Wound Description Classification: Full Thickness  Without Exposed Support Structures Wound Margin: Flat and Intact Exudate Amount: Medium Exudate Type:  Serous Exudate Color: amber Foul Odor After Cleansing: No Slough/Fibrino Yes Wound Bed Granulation Amount: None Present (0%) Exposed Structure Necrotic Amount: Large (67-100%) Fascia Exposed: No Necrotic Quality: Eschar, Adherent Slough Fat Layer (Subcutaneous Tissue) Exposed: Yes Tendon Exposed: No Muscle Exposed: No Joint Exposed: No Bone Exposed: No Treatment Notes Wound #8 (Lower Leg) Wound Laterality: Right, Anterior Cleanser Peri-Wound Care Topical Primary Dressing Santyl Ointment Discharge Instruction: Apply nickel thick amount to wound bed as instructed Secondary Dressing Woven Gauze Sponge, Non-Sterile 4x4 in Discharge Instruction: Apply over primary dressing as directed. Secured With Conforming Stretch Gauze Bandage, Sterile 2x75 (in/in) Discharge Instruction: Secure with stretch gauze as directed. Compression Wrap Compression Stockings Add-Ons Electronic Signature(s) Signed: 09/01/2021 11:36:31 AM By: Baruch Gouty RN, BSN Signed: 09/01/2021 11:36:31 AM By: Baruch Gouty RN, BSN Entered By: Baruch Gouty on 09/01/2021 10:47:34 -------------------------------------------------------------------------------- Vitals Details Patient Name: Date of Service: Kelly [name] Kelly H M. 09/01/2021 10:15 A M Medical Record Number: [id number] Patient Account Number: [id number] Date of Birth/Sex: Treating RN: [birth year] (26 y.o. Elam Dutch Primary Care Ahuva Poynor: Kelly [name] Other Clinician: Referring Kamber Vignola: Treating Jac Romulus/Extender: Elsworth Soho in Treatment: 28 Vital Signs Time Taken: 10:29 Temperature (F): 98.2 Height (in): 62 Pulse (bpm): 88 Source: Stated Respiratory Rate (breaths/min): 18 Blood Pressure (mmHg): 119/84 Reference Range: 80 - 120 mg / dl Electronic Signature(s) Signed: 09/01/2021 11:36:31 AM By: Baruch Gouty RN, BSN Entered By: Baruch Gouty on 09/01/2021 10:29:32

## 2021-09-05 NOTE — Progress Notes (Signed)
LYNNDA, Kelly [name] ([id number]) Visit Report for 09/01/2021 Chief Complaint Document Details Patient Name: Date of Service: Kelly [name], Kelly [name]. 09/01/2021 10:15 A [name] Medical Record Number: [id number] Patient Account Number: [id number] Date of Birth/Sex: Treating RN: [birth year] (26 y.o. Kelly [name] Primary Care Provider: [name] Other Clinician: Referring Provider: Treating Provider/Extender: Elsworth Soho in Treatment: 28 Information Obtained from: Patient Chief Complaint Bilateral lower extremity wounds 04/21/21: left lower extremity wound s/p cellulitis 09/01/21; right lower extremity wound Electronic Signature(s) Signed: 09/01/2021 12:18:20 PM By: Kalman Shan DO Entered By: Kalman Shan on 09/01/2021 12:08:42 -------------------------------------------------------------------------------- Debridement Details Patient Name: Date of Service: Kelly [name]. 09/01/2021 10:15 A [name] Medical Record Number: [id number] Patient Account Number: [id number] Date of Birth/Sex: Treating RN: 1996/01/26 (26 y.o. Kelly [name] Primary Care Provider: [name] Other Clinician: Referring Provider: Treating Provider/Extender: Elsworth Soho in Treatment: 28 Debridement Performed for Assessment: Wound #8 Right,Anterior Lower Leg Performed By: Clinician Baruch Gouty, RN Debridement Type: Chemical/Enzymatic/Mechanical Agent Used: Santyl Level of Consciousness (Pre-procedure): Awake and Alert Pre-procedure Verification/Time Out No Taken: Bleeding: None Response to Treatment: Procedure was tolerated well Level of Consciousness (Post- Awake and Alert procedure): Post Debridement Measurements of Total Wound Length: (cm) 1.3 Width: (cm) 0.5 Depth: (cm) 0.1 Volume: (cm) 0.051 Character of Wound/Ulcer Post Debridement: Requires Further Debridement Post Procedure Diagnosis Same as Pre-procedure Electronic  Signature(s) Signed: 09/01/2021 11:36:31 AM By: Baruch Gouty RN, BSN Signed: 09/01/2021 12:18:20 PM By: Kalman Shan DO Entered By: Baruch Gouty on 09/01/2021 11:18:04 -------------------------------------------------------------------------------- Debridement Details Patient Name: Date of Service: Kelly [name]. 09/01/2021 10:15 A [name] Medical Record Number: [id number] Patient Account Number: [id number] Date of Birth/Sex: Treating RN: April 04, 1996 (26 y.o. Kelly [name] Primary Care Provider: [name] Other Clinician: Referring Provider: Treating Provider/Extender: Elsworth Soho in Treatment: 28 Debridement Performed for Assessment: Wound #4 Left,Lateral Ankle Performed By: Clinician Baruch Gouty, RN Debridement Type: Chemical/Enzymatic/Mechanical Agent Used: Santyl Level of Consciousness (Pre-procedure): Awake and Alert Pre-procedure Verification/Time Out No Taken: Bleeding: None Response to Treatment: Procedure was tolerated well Level of Consciousness (Post- Awake and Alert procedure): Post Debridement Measurements of Total Wound Length: (cm) 1.8 Width: (cm) 1.7 Depth: (cm) 0.1 Volume: (cm) 0.24 Character of Wound/Ulcer Post Debridement: Requires Further Debridement Post Procedure Diagnosis Same as Pre-procedure Electronic Signature(s) Signed: 09/01/2021 11:36:31 AM By: Baruch Gouty RN, BSN Signed: 09/01/2021 12:18:20 PM By: Kalman Shan DO Entered By: Baruch Gouty on 09/01/2021 11:18:38 -------------------------------------------------------------------------------- HPI Details Patient Name: Date of Service: Kelly [name]. 09/01/2021 10:15 A [name] Medical Record Number: [id number] Patient Account Number: [id number] Date of Birth/Sex: Treating RN: 01/30/1996 (26 y.o. Kelly [name] Primary Care Provider: [name] Other Clinician: Referring Provider: Treating Provider/Extender: Elsworth Soho in Treatment: 28 History of Present Illness HPI Description: Admission 6/21 Kelly [name] is A 26 year old female with a past medical history of systemic lupus erythematosus that presents with bilateral lower extremity wounds that started in April 2022. She has had skin issues in the past where she developed very small wounds but these healed with time. She has never had open wounds like she does on her legs. She currently reports minimal drainage to the wounds. She does have pain to these areas but has been overall stable. She denies signs of infection. She has visited the ED for this issue and was recently prescribed doxycycline for possible cellulitis. She follows with [facility] for her  SLE and is currently on Plaquenil, prednisone and CellCept. 6/27; patient presents for 1 week follow-up. She states she saw her rheumatologist on 6/22 and prednisone was increased from 10 mg to 20 mg daily. She is currently at the highest dose of Plaquenil and CellCept and is going to try an infusion later today at her regimen. She reports improvement to the wounds. She has been using collagen with dressing changes. She currently denies signs of infection. Readmission 8/26 Patient presents to clinic today for open wounds to her left lower extremity. Her previous wounds that were treated have closed. She states that on 8/9 she developed cellulitis and was treated with 2 rounds of antibiotics. Her cellulitis has resolved. She had blisters that opened and are now large wounds. She has seen her dermatologist and rheumatologist since she has systemic lupus and a hard time healing. No changes have been made to her medications. She was recommended antibiotic ointment by her dermatologist to her wounds She picked up the antibiotic ointment today.. She reports pain to these areas. She denies purulent drainage, increased warmth or erythema to the left lower extremity. She  currently keeps the areas covered. Of note she had an Achilles tendon rupture on her right leg and is currently in a soft cast. 9/13; patient presents for follow-up. She missed her last clinic appointment. She has been using Santyl to the wound beds daily. She has no issues or complaints today. She reports the Santyl is helping clean up the wounds. She currently denies signs of infection. 9/22; patient states that 1 week ago she was seen by dermatology and they placed an Unna boot with Bactroban on her left lower extremity. Today she presents with a Unna boot in place. She currently denies signs of infection. 10/7; patient presents for follow-up. She reports improvement. She has been using Santyl to the areas of nonviable tissue and collagen to granulation tissue. She has been approved for infusions for her lupus. She is going to start these soon. These will be injection she can do at home. She currently denies signs of infection. 10/21; patient presents for follow-up. She reports continued improvement to her wound healing. She has been using Santyl to all areas except for the superior posterior left leg wound and she is using collagen to this. She has started her injection therapy for lupus. She started 1 week ago. She currently denies signs of infection. 11/3; patient presents for follow-up. She has no issues or complaints today. She is on her third week of injection therapy for lupus and tolerating this well. She has no issues or complaints today. She denies signs of infection. 11/17; patient presents for 2-week follow-up. She reports improvement in wound healing. She denies signs of infection. 12/1; patient presents for follow-up. She has been using collagen to the anterior left lower extremity wounds and Santyl to the left lateral malleolus. She has no issues or complaints today. 12/15; patient presents for follow-up. She is using collagen to the 2 anterior left leg wounds and Santyl to the  left lateral malleolus wound. She reports improvement in wound healing. She denies signs of infection. 12/22; patient presents because she is having purulent drainage from her anterior left leg wound. She reports no inciting event and states that the area started draining spontaneously. She was hoping that the drainage would stop however this has not improved. She states this started 5 days ago. She reports tenderness to the wound bed. 12/29; patient presents for follow-up. She was able to  take Augmentin for 4 days but did not tolerate the GI side effects. She continues to take doxycycline. She reports improvement in drainage. She no longer has increased warmth to the surrounding wound bed. She ran out of [place] and has been using Vaseline to the wound beds. 1/6; Patient presents for follow-up. She has been taking Keflex without issues. She reports improvement in drainage and wound healing T the left lower o extremity. Unfortunately she developed another wound spontaneously to the right anterior lower extremity. She denies systemic signs of infection. Electronic Signature(s) Signed: 09/01/2021 12:18:20 PM By: Kalman Shan DO Entered By: Kalman Shan on 09/01/2021 12:09:57 -------------------------------------------------------------------------------- Physical Exam Details Patient Name: Date of Service: Fransico Setters. 09/01/2021 10:15 A [name] Medical Record Number: [id number] Patient Account Number: [id number] Date of Birth/Sex: Treating RN: [birth year] (26 y.o. Kelly [name] Primary Care Provider: [name] Other Clinician: Referring Provider: Treating Provider/Extender: Elsworth Soho in Treatment: 28 Constitutional respirations regular, non-labored and within target range for patient.. Cardiovascular 2+ dorsalis pedis/posterior tibialis pulses. Psychiatric pleasant and cooperative. Notes Left lower extremity: 3 wounds present. The anterior  proximal wound continues to have yellow serous drainage. The more distal wound has healthy granulation tissue present. The lateral ankle wound has granulation tissue and scant nonviable tissue. No increased warmth or erythema to any of the wound sites. Right lower extremity: 1 new wound present to the anterior aspect with nonviable tissue throughout. Tender to the touch. No drainage noted Electronic Signature(s) Signed: 09/01/2021 12:18:20 PM By: Kalman Shan DO Entered By: Kalman Shan on 09/01/2021 12:11:37 -------------------------------------------------------------------------------- Physician Orders Details Patient Name: Date of Service: Kelly Lou RIA Cipriano Bunker. 09/01/2021 10:15 A [name] Medical Record Number: [id number] Patient Account Number: [id number] Date of Birth/Sex: Treating RN: [birth year] (26 y.o. Kelly [name] Primary Care Provider: [name] Other Clinician: Referring Provider: Treating Provider/Extender: Elsworth Soho in Treatment: 28 Verbal / Phone Orders: No Diagnosis Coding Follow-up Appointments ppointment in 1 week. - with Dr. Heber [place] Return A Bathing/ Shower/ Hygiene May shower and wash wound with soap and water. Edema Control - Lymphedema / SCD / Other Elevate legs to the level of the heart or above for 30 minutes daily and/or when sitting, a frequency of: - 3-4 times a day throughout the day. Avoid standing for long periods of time. Additional Orders / Instructions Follow Nutritious Diet Wound Treatment Wound #4 - Ankle Wound Laterality: Left, Lateral Cleanser: Soap and Water 1 x Per Day/30 Days Discharge Instructions: May shower and wash wound with dial antibacterial soap and water prior to dressing change. Prim Dressing: Santyl Ointment 1 x Per Day/30 Days ary Discharge Instructions: Apply nickel thick amount to wound bed as instructed Secondary Dressing: Woven Gauze Sponge, Non-Sterile 4x4 in (Generic) 1 x Per  Day/30 Days Discharge Instructions: Apply over primary dressing as directed. Secured With: [employer], 4.5x3.1 (in/yd) (Generic) 1 x Per Day/30 Days Discharge Instructions: Secure with Kerlix as directed. Secured With: Paper Tape, 2x10 (in/yd) (Generic) 1 x Per Day/30 Days Discharge Instructions: Secure dressing with tape as directed. Wound #5 - Ankle Wound Laterality: Left, Anterior Cleanser: Soap and Water 1 x Per Day/30 Days Discharge Instructions: May shower and wash wound with dial antibacterial soap and water prior to dressing change. Prim Dressing: Promogran Prisma Matrix, 4.34 (sq in) (silver collagen) 1 x Per Day/30 Days ary Discharge Instructions: Moisten collagen with saline or hydrogel Secondary Dressing: Woven Gauze Sponge, Non-Sterile 4x4 in (Generic) 1 x Per  Day/30 Days Discharge Instructions: Apply over primary dressing as directed. Secured With: [employer], 4.5x3.1 (in/yd) (Generic) 1 x Per Day/30 Days Discharge Instructions: Secure with Kerlix as directed. Secured With: Paper Tape, 2x10 (in/yd) (Generic) 1 x Per Day/30 Days Discharge Instructions: Secure dressing with tape as directed. Wound #7 - Lower Leg Wound Laterality: Left, Anterior Cleanser: Soap and Water 1 x Per Day/30 Days Discharge Instructions: May shower and wash wound with dial antibacterial soap and water prior to dressing change. Topical: Triple Antibiotic Ointment, 1 (oz) Tube 1 x Per Day/30 Days Discharge Instructions: thin layer to wound bed Secondary Dressing: Woven Gauze Sponge, Non-Sterile 4x4 in (Generic) 1 x Per Day/30 Days Discharge Instructions: Apply over primary dressing as directed. Secured With: [employer], 4.5x3.1 (in/yd) (Generic) 1 x Per Day/30 Days Discharge Instructions: Secure with Kerlix as directed. Secured With: Paper Tape, 2x10 (in/yd) (Generic) 1 x Per Day/30 Days Discharge Instructions: Secure dressing with tape as directed. Wound #8 - Lower Leg Wound  Laterality: Right, Anterior Prim Dressing: Santyl Ointment 1 x Per Day/30 Days ary Discharge Instructions: Apply nickel thick amount to wound bed as instructed Secondary Dressing: Woven Gauze Sponge, Non-Sterile 4x4 in 1 x Per Day/30 Days Discharge Instructions: Apply over primary dressing as directed. Secured With: Child psychotherapist, Sterile 2x75 (in/in) 1 x Per Day/30 Days Discharge Instructions: Secure with stretch gauze as directed. Patient Medications llergies: No Known Allergies A Notifications Medication Indication Start End 09/01/2021 Keflex DOSE 1 - oral 500 mg capsule - 1 capsule oral q6h x 7 days Electronic Signature(s) Signed: 09/01/2021 12:13:17 PM By: Kalman Shan DO Previous Signature: 09/01/2021 11:36:31 AM Version By: Baruch Gouty RN, BSN Entered By: Kalman Shan on 09/01/2021 12:13:15 -------------------------------------------------------------------------------- Problem List Details Patient Name: Date of Service: Kelly [name]. 09/01/2021 10:15 A [name] Medical Record Number: [id number] Patient Account Number: [id number] Date of Birth/Sex: Treating RN: 1996/02/06 (26 y.o. Kelly [name] Primary Care Provider: [name] Other Clinician: Referring Provider: Treating Provider/Extender: Elsworth Soho in Treatment: 28 Active Problems ICD-10 Encounter Code Description Active Date MDM Diagnosis [id number] Non-pressure chronic ulcer of other part of left lower leg with fat layer exposed11/10/2020 No Yes L97.812 Non-pressure chronic ulcer of other part of right lower leg with fat layer 06/29/2021 No Yes exposed M32.9 Systemic lupus erythematosus, unspecified 02/14/2021 No Yes L03.116 Cellulitis of left lower limb 08/24/2021 No Yes Inactive Problems ICD-10 Code Description Active Date Inactive Date L97.819 Non-pressure chronic ulcer of other part of right lower leg with unspecified severity 02/13/2021  02/13/2021 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 with unspecified severity 02/13/2021 02/13/2021 [id number] Unspecified open wound, left lower leg, initial encounter 04/21/2021 04/21/2021 S91.002A Unspecified open wound, left ankle, initial encounter 04/21/2021 04/21/2021 Resolved Problems Electronic Signature(s) Signed: 09/01/2021 12:18:20 PM By: Kalman Shan DO Previous Signature: 09/01/2021 11:36:31 AM Version By: Baruch Gouty RN, BSN Entered By: Kalman Shan on 09/01/2021 12:08:07 -------------------------------------------------------------------------------- Progress Note Details Patient Name: Date of Service: Kelly [name]. 09/01/2021 10:15 A [name] Medical Record Number: [id number] Patient Account Number: [id number] Date of Birth/Sex: Treating RN: 1996-07-16 (26 y.o. Kelly [name] Primary Care Provider: [name] Other Clinician: Referring Provider: Treating Provider/Extender: Elsworth Soho in Treatment: 28 Subjective Chief Complaint Information obtained from Patient Bilateral lower extremity wounds 04/21/21: left lower extremity wound s/p cellulitis 09/01/21; right lower extremity wound History of Present Illness (HPI) Admission 6/21 Ms. Morayo Junius is A 26 year old  female with a past medical history of systemic lupus erythematosus that presents with bilateral lower extremity wounds that started in April 2022. She has had skin issues in the past where she developed very small wounds but these healed with time. She has never had open wounds like she does on her legs. She currently reports minimal drainage to the wounds. She does have pain to these areas but has been overall stable. She denies signs of infection. She has visited the ED for this issue and was recently prescribed doxycycline for possible cellulitis. She follows with [facility] for her SLE and is currently on Plaquenil, prednisone and  CellCept. 6/27; patient presents for 1 week follow-up. She states she saw her rheumatologist on 6/22 and prednisone was increased from 10 mg to 20 mg daily. She is currently at the highest dose of Plaquenil and CellCept and is going to try an infusion later today at her regimen. She reports improvement to the wounds. She has been using collagen with dressing changes. She currently denies signs of infection. Readmission 8/26 Patient presents to clinic today for open wounds to her left lower extremity. Her previous wounds that were treated have closed. She states that on 8/9 she developed cellulitis and was treated with 2 rounds of antibiotics. Her cellulitis has resolved. She had blisters that opened and are now large wounds. She has seen her dermatologist and rheumatologist since she has systemic lupus and a hard time healing. No changes have been made to her medications. She was recommended antibiotic ointment by her dermatologist to her wounds She picked up the antibiotic ointment today.. She reports pain to these areas. She denies purulent drainage, increased warmth or erythema to the left lower extremity. She currently keeps the areas covered. Of note she had an Achilles tendon rupture on her right leg and is currently in a soft cast. 9/13; patient presents for follow-up. She missed her last clinic appointment. She has been using Santyl to the wound beds daily. She has no issues or complaints today. She reports the Santyl is helping clean up the wounds. She currently denies signs of infection. 9/22; patient states that 1 week ago she was seen by dermatology and they placed an Unna boot with Bactroban on her left lower extremity. Today she presents with a Unna boot in place. She currently denies signs of infection. 10/7; patient presents for follow-up. She reports improvement. She has been using Santyl to the areas of nonviable tissue and collagen to granulation tissue. She has been approved for  infusions for her lupus. She is going to start these soon. These will be injection she can do at home. She currently denies signs of infection. 10/21; patient presents for follow-up. She reports continued improvement to her wound healing. She has been using Santyl to all areas except for the superior posterior left leg wound and she is using collagen to this. She has started her injection therapy for lupus. She started 1 week ago. She currently denies signs of infection. 11/3; patient presents for follow-up. She has no issues or complaints today. She is on her third week of injection therapy for lupus and tolerating this well. She has no issues or complaints today. She denies signs of infection. 11/17; patient presents for 2-week follow-up. She reports improvement in wound healing. She denies signs of infection. 12/1; patient presents for follow-up. She has been using collagen to the anterior left lower extremity wounds and Santyl to the left lateral malleolus. She has no issues or  complaints today. 12/15; patient presents for follow-up. She is using collagen to the 2 anterior left leg wounds and Santyl to the left lateral malleolus wound. She reports improvement in wound healing. She denies signs of infection. 12/22; patient presents because she is having purulent drainage from her anterior left leg wound. She reports no inciting event and states that the area started draining spontaneously. She was hoping that the drainage would stop however this has not improved. She states this started 5 days ago. She reports tenderness to the wound bed. 12/29; patient presents for follow-up. She was able to take Augmentin for 4 days but did not tolerate the GI side effects. She continues to take doxycycline. She reports improvement in drainage. She no longer has increased warmth to the surrounding wound bed. She ran out of [place] and has been using Vaseline to the wound beds. 1/6; Patient presents for  follow-up. She has been taking Keflex without issues. She reports improvement in drainage and wound healing T the left lower o extremity. Unfortunately she developed another wound spontaneously to the right anterior lower extremity. She denies systemic signs of infection. Patient History Information obtained from Patient. Family History Diabetes - Paternal Grandparents, Hypertension - Paternal Grandparents, Thyroid Problems - Maternal Grandparents,Paternal Grandparents, No family history of Cancer, Heart Disease, Hereditary Spherocytosis, Kidney Disease, Lung Disease, Seizures, Stroke, Tuberculosis. Social History Never smoker, Marital Status - Married, Alcohol Use - Moderate, Drug Use - Current History - Marijuana, Caffeine Use - Daily - Coffee. Medical History Respiratory Patient has history of Asthma Immunological Patient has history of Lupus Erythematosus Objective Constitutional respirations regular, non-labored and within target range for patient.. Vitals Time Taken: 10:29 AM, Height: 62 in, Source: Stated, Temperature: 98.2 F, Pulse: 88 bpm, Respiratory Rate: 18 breaths/min, Blood Pressure: 119/84 mmHg. Cardiovascular 2+ dorsalis pedis/posterior tibialis pulses. Psychiatric pleasant and cooperative. General Notes: Left lower extremity: 3 wounds present. The anterior proximal wound continues to have yellow serous drainage. The more distal wound has healthy granulation tissue present. The lateral ankle wound has granulation tissue and scant nonviable tissue. No increased warmth or erythema to any of the wound sites. Right lower extremity: 1 new wound present to the anterior aspect with nonviable tissue throughout. T ender to the touch. No drainage noted Integumentary (Hair, Skin) Wound #4 status is Open. Original cause of wound was Gradually Appeared. The date acquired was: 04/14/2021. The wound has been in treatment 19 weeks. The wound is located on the Left,Lateral Ankle. The  wound measures 1.8cm length x 1.7cm width x 0.1cm depth; 2.403cm^2 area and 0.24cm^3 volume. There is Fat Layer (Subcutaneous Tissue) exposed. There is no tunneling or undermining noted. There is a medium amount of serosanguineous drainage noted. The wound margin is distinct with the outline attached to the wound base. There is large (67-100%) red, pink granulation within the wound bed. There is a small (1- 33%) amount of necrotic tissue within the wound bed including Adherent Slough. Wound #5 status is Open. Original cause of wound was Gradually Appeared. The date acquired was: 04/14/2021. The wound has been in treatment 19 weeks. The wound is located on the Left,Anterior Ankle. The wound measures 1.2cm length x 0.4cm width x 0.2cm depth; 0.377cm^2 area and 0.075cm^3 volume. There is Fat Layer (Subcutaneous Tissue) exposed. There is no tunneling or undermining noted. There is a medium amount of serosanguineous drainage noted. The wound margin is flat and intact. There is large (67-100%) red, pink granulation within the wound bed. There is no necrotic  tissue within the wound bed. Wound #7 status is Open. Original cause of wound was Other Lesion. The date acquired was: 05/18/2021. The wound has been in treatment 15 weeks. The wound is located on the Left,Anterior Lower Leg. The wound measures 0.2cm length x 0.2cm width x 0.1cm depth; 0.031cm^2 area and 0.003cm^3 volume. There is Fat Layer (Subcutaneous Tissue) exposed. There is no undermining noted, however, there is tunneling at 11:00 with a maximum distance of 1.3cm. There is a medium amount of serous drainage noted. The wound margin is flat and intact. There is small (1-33%) pink granulation within the wound bed. There is no necrotic tissue within the wound bed. Wound #8 status is Open. Original cause of wound was Gradually Appeared. The date acquired was: 08/28/2021. The wound is located on the Right,Anterior Lower Leg. The wound measures 1.3cm length x  0.5cm width x 0.1cm depth; 0.511cm^2 area and 0.051cm^3 volume. There is Fat Layer (Subcutaneous Tissue) exposed. There is no tunneling or undermining noted. There is a medium amount of serous drainage noted. The wound margin is flat and intact. There is no granulation within the wound bed. There is a large (67-100%) amount of necrotic tissue within the wound bed including Eschar and Adherent Slough. Assessment Active Problems ICD-10 Non-pressure chronic ulcer of other part of left lower leg with fat layer exposed Non-pressure chronic ulcer of other part of right lower leg with fat layer exposed Systemic lupus erythematosus, unspecified Cellulitis of left lower limb Patient's wounds to the left lower extremity have improved in size in appearance since last clinic visit. The tunnel to the wound on the proximal anterior aspect has become smaller. She has drainage although this is not thick white drainage as before. The wound area is very small. I recommended using a foam border dressing to this area. I recommended Santyl to the left lateral ankle and collagen to the distal left lower extremity wound. I will continue Keflex for another week Since she still has drainage. Unfortunately she has developed another wound to the right anterior lower leg that happened spontaneously. I recommended Santyl to this area. She continues to do weekly infusions for her lupus. These wounds are all related to her rheumatologic disorder. Follow-up in 1 week. Procedures Wound #4 Pre-procedure diagnosis of Wound #4 is a Cellulitis located on the Left,Lateral Ankle . There was a Chemical/Enzymatic/Mechanical debridement performed by Baruch Gouty, RN.Marland Kitchen Agent used was [employer]. There was no bleeding. The procedure was tolerated well. Post Debridement Measurements: 1.8cm length x 1.7cm width x 0.1cm depth; 0.24cm^3 volume. Character of Wound/Ulcer Post Debridement requires further debridement. Post procedure Diagnosis  Wound #4: Same as Pre-Procedure Wound #8 Pre-procedure diagnosis of Wound #8 is a Lupus located on the Right,Anterior Lower Leg . There was a Chemical/Enzymatic/Mechanical debridement performed by Baruch Gouty, RN.Marland Kitchen Agent used was [employer]. There was no bleeding. The procedure was tolerated well. Post Debridement Measurements: 1.3cm length x 0.5cm width x 0.1cm depth; 0.051cm^3 volume. Character of Wound/Ulcer Post Debridement requires further debridement. Post procedure Diagnosis Wound #8: Same as Pre-Procedure Plan Follow-up Appointments: Return Appointment in 1 week. - with Dr. Heber [place] Bathing/ Shower/ Hygiene: May shower and wash wound with soap and water. Edema Control - Lymphedema / SCD / Other: Elevate legs to the level of the heart or above for 30 minutes daily and/or when sitting, a frequency of: - 3-4 times a day throughout the day. Avoid standing for long periods of time. Additional Orders / Instructions: Follow Nutritious Diet The following medication(s)  was prescribed: Keflex oral 500 mg capsule 1 1 capsule oral q6h x 7 days starting 09/01/2021 WOUND #4: - Ankle Wound Laterality: Left, Lateral Cleanser: Soap and Water 1 x Per Day/30 Days Discharge Instructions: May shower and wash wound with dial antibacterial soap and water prior to dressing change. Prim Dressing: Santyl Ointment 1 x Per Day/30 Days ary Discharge Instructions: Apply nickel thick amount to wound bed as instructed Secondary Dressing: Woven Gauze Sponge, Non-Sterile 4x4 in (Generic) 1 x Per Day/30 Days Discharge Instructions: Apply over primary dressing as directed. Secured With: [employer], 4.5x3.1 (in/yd) (Generic) 1 x Per Day/30 Days Discharge Instructions: Secure with Kerlix as directed. Secured With: Paper T ape, 2x10 (in/yd) (Generic) 1 x Per Day/30 Days Discharge Instructions: Secure dressing with tape as directed. WOUND #5: - Ankle Wound Laterality: Left, Anterior Cleanser: Soap and Water 1 x  Per Day/30 Days Discharge Instructions: May shower and wash wound with dial antibacterial soap and water prior to dressing change. Prim Dressing: Promogran Prisma Matrix, 4.34 (sq in) (silver collagen) 1 x Per Day/30 Days ary Discharge Instructions: Moisten collagen with saline or hydrogel Secondary Dressing: Woven Gauze Sponge, Non-Sterile 4x4 in (Generic) 1 x Per Day/30 Days Discharge Instructions: Apply over primary dressing as directed. Secured With: [employer], 4.5x3.1 (in/yd) (Generic) 1 x Per Day/30 Days Discharge Instructions: Secure with Kerlix as directed. Secured With: Paper T ape, 2x10 (in/yd) (Generic) 1 x Per Day/30 Days Discharge Instructions: Secure dressing with tape as directed. WOUND #7: - Lower Leg Wound Laterality: Left, Anterior Cleanser: Soap and Water 1 x Per Day/30 Days Discharge Instructions: May shower and wash wound with dial antibacterial soap and water prior to dressing change. Topical: Triple Antibiotic Ointment, 1 (oz) Tube 1 x Per Day/30 Days Discharge Instructions: thin layer to wound bed Secondary Dressing: Woven Gauze Sponge, Non-Sterile 4x4 in (Generic) 1 x Per Day/30 Days Discharge Instructions: Apply over primary dressing as directed. Secured With: [employer], 4.5x3.1 (in/yd) (Generic) 1 x Per Day/30 Days Discharge Instructions: Secure with Kerlix as directed. Secured With: Paper T ape, 2x10 (in/yd) (Generic) 1 x Per Day/30 Days Discharge Instructions: Secure dressing with tape as directed. WOUND #8: - Lower Leg Wound Laterality: Right, Anterior Prim Dressing: Santyl Ointment 1 x Per Day/30 Days ary Discharge Instructions: Apply nickel thick amount to wound bed as instructed Secondary Dressing: Woven Gauze Sponge, Non-Sterile 4x4 in 1 x Per Day/30 Days Discharge Instructions: Apply over primary dressing as directed. Secured With: Child psychotherapist, Sterile 2x75 (in/in) 1 x Per Day/30 Days Discharge Instructions:  Secure with stretch gauze as directed. 1. Continue Keflex 2. Santyl 3. Collagen 4. Foam border dressing 5. Follow-up in 1 week Electronic Signature(s) Signed: 09/01/2021 12:18:20 PM By: Kalman Shan DO Entered By: Kalman Shan on 09/01/2021 12:17:42 -------------------------------------------------------------------------------- HxROS Details Patient Name: Date of Service: Kelly [name]. 09/01/2021 10:15 A [name] Medical Record Number: [id number] Patient Account Number: [id number] Date of Birth/Sex: Treating RN: 09-13-1995 (26 y.o. Kelly [name] Primary Care Provider: [name] Other Clinician: Referring Provider: Treating Provider/Extender: Elsworth Soho in Treatment: 28 Information Obtained From Patient Respiratory Medical History: Positive for: Asthma Immunological Medical History: Positive for: Lupus Erythematosus Immunizations Pneumococcal Vaccine: Received Pneumococcal Vaccination: No Implantable Devices None Family and Social History Cancer: No; Diabetes: Yes - Paternal Grandparents; Heart Disease: No; Hereditary Spherocytosis: No; Hypertension: Yes - Paternal Grandparents; Kidney Disease: No; Lung Disease: No; Seizures: No; Stroke: No; Thyroid Problems: Yes - Maternal  Grandparents,Paternal Grandparents; Tuberculosis: No; Never smoker; Marital Status - Married; Alcohol Use: Moderate; Drug Use: Current History - Marijuana; Caffeine Use: Daily - Coffee; Financial Concerns: No; Food, Clothing or Shelter Needs: No; Support System Lacking: No Electronic Signature(s) Signed: 09/01/2021 12:18:20 PM By: Kalman Shan DO Signed: 09/05/2021 5:43:46 PM By: Baruch Gouty RN, BSN Entered By: Kalman Shan on 09/01/2021 12:10:14 -------------------------------------------------------------------------------- SuperBill Details Patient Name: Date of Service: Kelly [name]. 09/01/2021 Medical Record Number:  [id number] Patient Account Number: [id number] Date of Birth/Sex: Treating RN: [birth year] (26 y.o. [name], [name] Primary Care Provider: [name] Other Clinician: Referring Provider: Treating Provider/Extender: Elsworth Soho in Treatment: 28 Diagnosis Coding ICD-10 Codes Code Description [phone] Non-pressure chronic ulcer of other part of left lower leg with fat layer exposed L97.812 Non-pressure chronic ulcer of other part of right lower leg with fat layer exposed M32.9 Systemic lupus erythematosus, unspecified L03.116 Cellulitis of left lower limb Facility Procedures CPT4 Code: [id number] Description: [phone] - DEBRIDE W/O ANES NON SELECT Modifier: Quantity: 1 Physician Procedures : CPT4 Code Description Modifier [id number] - WC PHYS LEVEL 3 - EST PT ICD-10 Diagnosis Description L97.822 Non-pressure chronic ulcer of other part of left lower leg with fat layer exposed L97.812 Non-pressure chronic ulcer of other part of right  lower leg with fat layer exposed M32.9 Systemic lupus erythematosus, unspecified L03.116 Cellulitis of left lower limb Quantity: 1 Electronic Signature(s) Signed: 09/01/2021 12:18:20 PM By: Kalman Shan DO Previous Signature: 09/01/2021 11:36:31 AM Version By: Baruch Gouty RN, BSN Entered By: Kalman Shan on 09/01/2021 12:18:01

## 2021-09-11 ENCOUNTER — Other Ambulatory Visit: Payer: Self-pay

## 2021-09-11 ENCOUNTER — Encounter (HOSPITAL_BASED_OUTPATIENT_CLINIC_OR_DEPARTMENT_OTHER): Payer: BC Managed Care – PPO | Admitting: Internal Medicine

## 2021-09-11 DIAGNOSIS — L97812 Non-pressure chronic ulcer of other part of right lower leg with fat layer exposed: Secondary | ICD-10-CM | POA: Diagnosis not present

## 2021-09-11 DIAGNOSIS — L97822 Non-pressure chronic ulcer of other part of left lower leg with fat layer exposed: Secondary | ICD-10-CM | POA: Diagnosis not present

## 2021-09-12 NOTE — Progress Notes (Signed)
Kelly [name], Kelly [name] ([id number]) Visit Report for 09/11/2021 Arrival Information Details Patient Name: Date of Service: Kelly [name], Kelly [name]. 09/11/2021 3:00 PM Medical Record Number: [id number] Patient Account Number: [id number] Date of Birth/Sex: Treating RN: 04-29-1996 (26 y.o. America Brown Primary Care Tamirra Sienkiewicz: [name] Other Clinician: Referring Flynn Gwyn: Treating Allexus Ovens/Extender: Elsworth Soho in Treatment: 54 Visit Information History Since Last Visit Added or deleted any medications: No Patient Arrived: Ambulatory Any new allergies or adverse reactions: No Arrival Time: 15:10 Signs or symptoms of abuse/neglect since last visito No Accompanied By: self Hospitalized since last visit: No Transfer Assistance: None Implantable device outside of the clinic excluding No Patient Identification Verified: Yes cellular tissue based products placed in the center Secondary Verification Process Completed: Yes since last visit: Patient Requires Transmission-Based Precautions: No Has Dressing in Place as Prescribed: Yes Patient Has Alerts: Yes Pain Present Now: Yes Patient Alerts: Left ABi=1.18 Electronic Signature(s) Signed: 09/12/2021 3:29:02 PM By: Dellie Catholic RN Entered By: Dellie Catholic on 09/11/2021 15:12:35 -------------------------------------------------------------------------------- Encounter Discharge Information Details Patient Name: Date of Service: Kelly Lou [name] 09/11/2021 3:00 PM Medical Record Number: [id number] Patient Account Number: [id number] Date of Birth/Sex: Treating RN: 10/18/1995 (27 y.o. Elam Dutch Primary Care Ariona Deschene: [name] Other Clinician: Referring Jacarra Bobak: Treating Shreeya Recendiz/Extender: Elsworth Soho in Treatment: 30 Encounter Discharge Information Items Post Procedure Vitals Discharge Condition: Stable Temperature (F): 98.4 Ambulatory Status:  Ambulatory Pulse (bpm): 87 Discharge Destination: Home Respiratory Rate (breaths/min): 18 Transportation: Private Auto Blood Pressure (mmHg): 135/94 Accompanied By: self Schedule Follow-up Appointment: Yes Clinical Summary of Care: Patient Declined Electronic Signature(s) Signed: 09/12/2021 11:39:47 AM By: Baruch Gouty RN, BSN Entered By: Baruch Gouty on 09/11/2021 15:54:27 -------------------------------------------------------------------------------- Lower Extremity Assessment Details Patient Name: Date of Service: Kelly Lou [name] 09/11/2021 3:00 PM Medical Record Number: [id number] Patient Account Number: [id number] Date of Birth/Sex: Treating RN: 22-Feb-1996 (26 y.o. America Brown Primary Care Kevon Tench: [name] Other Clinician: Referring Blessin Kanno: Treating Siniya Lichty/Extender: Elsworth Soho in Treatment: 30 Edema Assessment Assessed: Shirlyn Goltz: No] Patrice Paradise: No] Edema: [Left: No] [Right: Yes] Calf Left: Right: Point of Measurement: 27 cm From Medial Instep 34 cm 34 cm Ankle Left: Right: Point of Measurement: 8 cm From Medial Instep 22 cm 22.2 cm Vascular Assessment Pulses: Dorsalis Pedis Palpable: [Left:Yes] [Right:Yes] Electronic Signature(s) Signed: 09/12/2021 3:29:02 PM By: Dellie Catholic RN Entered By: Dellie Catholic on 09/11/2021 15:22:17 -------------------------------------------------------------------------------- Multi Wound Chart Details Patient Name: Date of Service: Kelly Lou [name] 09/11/2021 3:00 PM Medical Record Number: [id number] Patient Account Number: [id number] Date of Birth/Sex: Treating RN: October 19, 1995 (26 y.o. Sue Lush Primary Care Kartier Bennison: [name] Other Clinician: Referring Mazikeen Hehn: Treating Teaira Croft/Extender: Elsworth Soho in Treatment: 30 Vital Signs Height(in): 62 Pulse(bpm): 87 Weight(lbs): Blood Pressure(mmHg): 135/94 Body Mass  Index(BMI): Temperature(F): 98.4 Respiratory Rate(breaths/min): 18 Photos: Left, Lateral Ankle Left, Anterior Ankle Left, Anterior Lower Leg Wound Location: Gradually Appeared Gradually Appeared Other Lesion Wounding Event: Cellulitis Cellulitis Lupus Primary Etiology: Lupus Lupus N/A Secondary Etiology: Asthma, Lupus Erythematosus Asthma, Lupus Erythematosus Asthma, Lupus Erythematosus Comorbid History: 04/14/2021 04/14/2021 05/18/2021 Date Acquired: _0 Weeks of Treatment: Open Open Open Wound Status: 1.8x1.4x0.1 1.1x0.3x0.1 0x0x0 Measurements L x W x D (cm) 1.979 0.259 0 A (cm) : rea 0.198 0.026 0 Volume (cm) : 87.40% 72.50% 100.00% % Reduction in Area: 96.80% 86.20% 100.00% % Reduction in Volume: Full Thickness Without Exposed Full Thickness Without Exposed Full Thickness  Without Exposed Classification: Support Structures Support Structures Support Structures Medium Medium None Present Exudate A mount: Serosanguineous Serosanguineous N/A Exudate Type: red, brown red, brown N/A Exudate Color: Distinct, outline attached Flat and Intact N/A Wound Margin: Large (67-100%) Large (67-100%) None Present (0%) Granulation A mount: Pink Red, Pink N/A Granulation Quality: Small (1-33%) None Present (0%) None Present (0%) Necrotic A mount: Fat Layer (Subcutaneous Tissue): Yes Fat Layer (Subcutaneous Tissue): Yes Fascia: No Exposed Structures: Fascia: No Fascia: No Fat Layer (Subcutaneous Tissue): No Tendon: No Tendon: No Tendon: No Muscle: No Muscle: No Muscle: No Joint: No Joint: No Joint: No Bone: No Bone: No Bone: No Small (1-33%) Small (1-33%) Large (67-100%) Epithelialization: N/A N/A N/A Debridement: N/A N/A N/A Pain Control: N/A N/A N/A Tissue Debrided: N/A N/A N/A Level: N/A N/A N/A Debridement A (sq cm): rea N/A N/A N/A Instrument: N/A N/A N/A Bleeding: N/A N/A N/A Hemostasis A chieved: N/A N/A N/A Procedural Pain: N/A N/A  N/A Post Procedural Pain: Debridement Treatment Response: N/A N/A N/A Post Debridement Measurements L x N/A N/A N/A W x D (cm) N/A N/A N/A Post Debridement Volume: (cm) N/A N/A N/A Procedures Performed: Wound Number: 8 N/A N/A Photos: N/A N/A Right, Anterior Lower Leg N/A N/A Wound Location: Gradually Appeared N/A N/A Wounding Event: Lupus N/A N/A Primary Etiology: N/A N/A N/A Secondary Etiology: Asthma, Lupus Erythematosus N/A N/A Comorbid History: 08/28/2021 N/A N/A Date Acquired: 1 N/A N/A Weeks of Treatment: Open N/A N/A Wound Status: 1.2x0.5x0.2 N/A N/A Measurements L x W x D (cm) 0.471 N/A N/A A (cm) : rea 0.094 N/A N/A Volume (cm) : 7.80% N/A N/A % Reduction in A rea: -84.30% N/A N/A % Reduction in Volume: Full Thickness Without Exposed N/A N/A Classification: Support Structures Medium N/A N/A Exudate A mount: Serous N/A N/A Exudate Type: amber N/A N/A Exudate Color: Flat and Intact N/A N/A Wound Margin: None Present (0%) N/A N/A Granulation A mount: N/A N/A N/A Granulation Quality: Large (67-100%) N/A N/A Necrotic A mount: Fat Layer (Subcutaneous Tissue): Yes N/A N/A Exposed Structures: Fascia: No Tendon: No Muscle: No Joint: No Bone: No None N/A N/A Epithelialization: Debridement - Excisional N/A N/A Debridement: Pre-procedure Verification/Time Out 15:35 N/A N/A Taken: Other N/A N/A Pain Control: Subcutaneous, Slough N/A N/A Tissue Debrided: Skin/Subcutaneous Tissue N/A N/A Level: 0.6 N/A N/A Debridement A (sq cm): rea Curette N/A N/A Instrument: Minimum N/A N/A Bleeding: Pressure N/A N/A Hemostasis Achieved: 9 N/A N/A Procedural Pain: 8 N/A N/A Post Procedural Pain: Procedure was tolerated well N/A N/A Debridement Treatment Response: 1.2x0.5x0.2 N/A N/A Post Debridement Measurements L x W x D (cm) 0.094 N/A N/A Post Debridement Volume: (cm) Debridement N/A N/A Procedures Performed: Treatment Notes Wound #4  (Ankle) Wound Laterality: Left, Lateral Cleanser Soap and Water Discharge Instruction: May shower and wash wound with dial antibacterial soap and water prior to dressing change. Peri-Wound Care Topical Primary Dressing Dakin's Solution 0.125%, 16 (oz) Discharge Instruction: Moisten gauze with Dakin's solution Secondary Dressing Woven Gauze Sponge, Non-Sterile 4x4 in Discharge Instruction: Apply over primary dressing as directed. Secured With [employer], 4.5x3.1 (in/yd) Discharge Instruction: Secure with Kerlix as directed. Paper Tape, 2x10 (in/yd) Discharge Instruction: Secure dressing with tape as directed. Compression Wrap Compression Stockings Add-Ons Wound #5 (Ankle) Wound Laterality: Left, Anterior Cleanser Soap and Water Discharge Instruction: May shower and wash wound with dial antibacterial soap and water prior to dressing change. Peri-Wound Care Topical Primary Dressing Promogran Prisma Matrix, 4.34 (sq in) (silver collagen) Discharge Instruction:  Moisten collagen with saline or hydrogel Secondary Dressing Woven Gauze Sponge, Non-Sterile 4x4 in Discharge Instruction: Apply over primary dressing as directed. Secured With [employer], 4.5x3.1 (in/yd) Discharge Instruction: Secure with Kerlix as directed. Paper Tape, 2x10 (in/yd) Discharge Instruction: Secure dressing with tape as directed. Compression Wrap Compression Stockings Add-Ons Wound #7 (Lower Leg) Wound Laterality: Left, Anterior Cleanser Peri-Wound Care Topical Primary Dressing Secondary Dressing Secured With Compression Wrap Compression Stockings Add-Ons Wound #8 (Lower Leg) Wound Laterality: Right, Anterior Cleanser Peri-Wound Care Topical Primary Dressing Santyl Ointment Discharge Instruction: Apply nickel thick amount to wound bed as instructed Secondary Dressing Woven Gauze Sponge, Non-Sterile 4x4 in Discharge Instruction: Apply over primary dressing as  directed. Secured With Conforming Stretch Gauze Bandage, Sterile 2x75 (in/in) Discharge Instruction: Secure with stretch gauze as directed. Compression Wrap Compression Stockings Add-Ons Electronic Signature(s) Signed: 09/12/2021 9:05:14 AM By: Kalman Shan DO Signed: 09/12/2021 3:58:04 PM By: Lorrin Jackson Entered By: Kalman Shan on 09/11/2021 16:05:48 -------------------------------------------------------------------------------- Multi-Disciplinary Care Plan Details Patient Name: Date of Service: Kelly Lou [name] 09/11/2021 3:00 PM Medical Record Number: [id number] Patient Account Number: [id number] Date of Birth/Sex: Treating RN: 03/05/1996 (26 y.o. America Brown Primary Care Aydrian Halpin: [name] Other Clinician: Referring Journee Kohen: Treating Daren Yeagle/Extender: Elsworth Soho in Treatment: [place] reviewed with physician Active Inactive Wound/Skin Impairment Nursing Diagnoses: Impaired tissue integrity Goals: Patient/caregiver will verbalize understanding of skin care regimen Date Initiated: 02/13/2021 Target Resolution Date: 09/15/2021 Goal Status: Active Ulcer/skin breakdown will have a volume reduction of 30% by week 4 Date Initiated: 02/13/2021 Date Inactivated: 04/21/2021 Target Resolution Date: 03/13/2021 Unmet Reason: pt did not return to Goal Status: Unmet clinic for F/U Ulcer/skin breakdown will have a volume reduction of 80% by week 12 Date Initiated: 04/21/2021 Date Inactivated: 07/13/2021 Target Resolution Date: 07/27/2021 Goal Status: Met Interventions: Assess patient/caregiver ability to obtain necessary supplies Assess patient/caregiver ability to perform ulcer/skin care regimen upon admission and as needed Assess ulceration(s) every visit Provide education on ulcer and skin care Treatment Activities: Skin care regimen initiated : 02/13/2021 Topical wound management initiated :  02/13/2021 Notes: 06/29/21: Wounds 3 and 6 only at 80% volume reduction. Target date extended. Electronic Signature(s) Signed: 09/12/2021 3:29:02 PM By: Dellie Catholic RN Entered By: Dellie Catholic on 09/11/2021 15:30:20 -------------------------------------------------------------------------------- Pain Assessment Details Patient Name: Date of Service: Kelly [name], [name] 09/11/2021 3:00 PM Medical Record Number: [id number] Patient Account Number: [id number] Date of Birth/Sex: Treating RN: [birth year] (26 y.o. America Brown Primary Care Wenda Vanschaick: [name] Other Clinician: Referring Aerianna Losey: Treating Guadalupe Nickless/Extender: Elsworth Soho in Treatment: 30 Active Problems Location of Pain Severity and Description of Pain Patient Has Paino Yes Site Locations Pain Location: Pain in Ulcers With Dressing Change: Yes Duration of the Pain. Constant / Intermittento Constant Rate the pain. Current Pain Level: 9 Worst Pain Level: 10 Least Pain Level: 8 Character of Pain Describe the Pain: Aching, Burning Pain Management and Medication Current Pain Management: Medication: Yes Is the Current Pain Management Adequate: Adequate How does your wound impact your activities of daily livingo Sleep: Yes Bathing: No Appetite: No Relationship With Others: No Bladder Continence: No Emotions: Yes Bowel Continence: No Hobbies: No Toileting: No Dressing: No Electronic Signature(s) Signed: 09/12/2021 3:29:02 PM By: Dellie Catholic RN Entered By: Dellie Catholic on 09/11/2021 15:13:38 -------------------------------------------------------------------------------- Patient/Caregiver Education Details Patient Name: Date of Service: Kelly [name] 1/16/2023andnbsp3:00 PM Medical Record Number: [id number] Patient Account Number: [id number] Date of Birth/Gender: Treating  RN: Jun 09, 1996 (26 y.o. America Brown Primary Care Physician: [name] Other Clinician: Referring Physician: Treating Physician/Extender: Elsworth Soho in Treatment: 30 Education Assessment Education Provided To: Patient Education Topics Provided Wound/Skin Impairment: Methods: Explain/Verbal Responses: Reinforcements needed, State content correctly Electronic Signature(s) Signed: 09/12/2021 3:29:02 PM By: Dellie Catholic RN Entered By: Dellie Catholic on 09/11/2021 15:30:52 -------------------------------------------------------------------------------- Wound Assessment Details Patient Name: Date of Service: Kelly Lou [name] 09/11/2021 3:00 PM Medical Record Number: [id number] Patient Account Number: [id number] Date of Birth/Sex: Treating RN: December 18, 1995 (26 y.o. America Brown Primary Care Yocelin Vanlue: [name] Other Clinician: Referring Sadik Piascik: Treating Kahmari Herard/Extender: Elsworth Soho in Treatment: 30 Wound Status Wound Number: 4 Primary Etiology: Cellulitis Wound Location: Left, Lateral Ankle Secondary Etiology: Lupus Wounding Event: Gradually Appeared Wound Status: Open Date Acquired: 04/14/2021 Comorbid History: Asthma, Lupus Erythematosus Weeks Of Treatment: 20 Clustered Wound: No Photos Wound Measurements Length: (cm) 1.8 Width: (cm) 1.4 Depth: (cm) 0.1 Area: (cm) 1.979 Volume: (cm) 0.198 % Reduction in Area: 87.4% % Reduction in Volume: 96.8% Epithelialization: Small (1-33%) Tunneling: No Undermining: No Wound Description Classification: Full Thickness Without Exposed Support Structures Wound Margin: Distinct, outline attached Exudate Amount: Medium Exudate Type: Serosanguineous Exudate Color: red, brown Foul Odor After Cleansing: No Slough/Fibrino No Wound Bed Granulation Amount: Large (67-100%) Exposed Structure Granulation Quality: Pink Fascia Exposed: No Necrotic Amount: Small (1-33%) Fat Layer (Subcutaneous Tissue) Exposed:  Yes Necrotic Quality: Adherent Slough Tendon Exposed: No Muscle Exposed: No Joint Exposed: No Bone Exposed: No Treatment Notes Wound #4 (Ankle) Wound Laterality: Left, Lateral Cleanser Soap and Water Discharge Instruction: May shower and wash wound with dial antibacterial soap and water prior to dressing change. Peri-Wound Care Topical Primary Dressing Dakin's Solution 0.125%, 16 (oz) Discharge Instruction: Moisten gauze with Dakin's solution Secondary Dressing Woven Gauze Sponge, Non-Sterile 4x4 in Discharge Instruction: Apply over primary dressing as directed. Secured With [employer], 4.5x3.1 (in/yd) Discharge Instruction: Secure with Kerlix as directed. Paper Tape, 2x10 (in/yd) Discharge Instruction: Secure dressing with tape as directed. Compression Wrap Compression Stockings Add-Ons Electronic Signature(s) Signed: 09/12/2021 11:39:47 AM By: Baruch Gouty RN, BSN Signed: 09/12/2021 3:29:02 PM By: Dellie Catholic RN Entered By: Baruch Gouty on 09/11/2021 15:26:55 -------------------------------------------------------------------------------- Wound Assessment Details Patient Name: Date of Service: Kelly Lou [name] 09/11/2021 3:00 PM Medical Record Number: [id number] Patient Account Number: [id number] Date of Birth/Sex: Treating RN: Jan 11, 1996 (26 y.o. America Brown Primary Care [place] Spees: [name] Other Clinician: Referring Darinda [place]: Treating Jeana Kersting/Extender: Elsworth Soho in Treatment: 30 Wound Status Wound Number: 5 Primary Etiology: Cellulitis Wound Location: Left, Anterior Ankle Secondary Etiology: Lupus Wounding Event: Gradually Appeared Wound Status: Open Date Acquired: 04/14/2021 Comorbid History: Asthma, Lupus Erythematosus Weeks Of Treatment: 20 Clustered Wound: No Photos Wound Measurements Length: (cm) 1.1 Width: (cm) 0.3 Depth: (cm) 0.1 Area: (cm) 0.259 Volume: (cm) 0.026 % Reduction  in Area: 72.5% % Reduction in Volume: 86.2% Epithelialization: Small (1-33%) Tunneling: No Undermining: No Wound Description Classification: Full Thickness Without Exposed Support Structures Wound Margin: Flat and Intact Exudate Amount: Medium Exudate Type: Serosanguineous Exudate Color: red, brown Foul Odor After Cleansing: No Slough/Fibrino Yes Wound Bed Granulation Amount: Large (67-100%) Exposed Structure Granulation Quality: Red, Pink Fascia Exposed: No Necrotic Amount: None Present (0%) Fat Layer (Subcutaneous Tissue) Exposed: Yes Tendon Exposed: No Muscle Exposed: No Joint Exposed: No Bone Exposed: No Treatment Notes Wound #5 (Ankle) Wound Laterality: Left, Anterior Cleanser Soap and Water Discharge Instruction: May shower and wash  wound with dial antibacterial soap and water prior to dressing change. Peri-Wound Care Topical Primary Dressing Promogran Prisma Matrix, 4.34 (sq in) (silver collagen) Discharge Instruction: Moisten collagen with saline or hydrogel Secondary Dressing Woven Gauze Sponge, Non-Sterile 4x4 in Discharge Instruction: Apply over primary dressing as directed. Secured With [employer], 4.5x3.1 (in/yd) Discharge Instruction: Secure with Kerlix as directed. Paper Tape, 2x10 (in/yd) Discharge Instruction: Secure dressing with tape as directed. Compression Wrap Compression Stockings Add-Ons Electronic Signature(s) Signed: 09/12/2021 11:39:47 AM By: Baruch Gouty RN, BSN Signed: 09/12/2021 3:29:02 PM By: Dellie Catholic RN Entered By: Baruch Gouty on 09/11/2021 15:27:31 -------------------------------------------------------------------------------- Wound Assessment Details Patient Name: Date of Service: Kelly Lou [name] 09/11/2021 3:00 PM Medical Record Number: [id number] Patient Account Number: [id number] Date of Birth/Sex: Treating RN: [birth year] (26 y.o. America Brown Primary Care Kendall Justo: [name] Other  Clinician: Referring Keithan Dileonardo: Treating Rotha Cassels/Extender: Elsworth Soho in Treatment: 30 Wound Status Wound Number: 7 Primary Etiology: Lupus Wound Location: Left, Anterior Lower Leg Wound Status: Open Wounding Event: Other Lesion Comorbid History: Asthma, Lupus Erythematosus Date Acquired: 05/18/2021 Weeks Of Treatment: 16 Clustered Wound: No Photos Wound Measurements Length: (cm) Width: (cm) Depth: (cm) Area: (cm) Volume: (cm) 0 % Reduction in Area: 100% 0 % Reduction in Volume: 100% 0 Epithelialization: Large (67-100%) 0 Tunneling: No 0 Undermining: No Wound Description Classification: Full Thickness Without Exposed Support Structures Exudate Amount: None Present Wound Bed Granulation Amount: None Present (0%) Necrotic Amount: None Present (0%) Foul Odor After Cleansing: No Slough/Fibrino No Exposed Structure Fascia Exposed: No Fat Layer (Subcutaneous Tissue) Exposed: No Tendon Exposed: No Muscle Exposed: No Joint Exposed: No Bone Exposed: No Electronic Signature(s) Signed: 09/12/2021 11:39:47 AM By: Baruch Gouty RN, BSN Signed: 09/12/2021 3:29:02 PM By: Dellie Catholic RN Entered By: Baruch Gouty on 09/11/2021 15:28:33 -------------------------------------------------------------------------------- Wound Assessment Details Patient Name: Date of Service: Kelly Lou [name] 09/11/2021 3:00 PM Medical Record Number: [id number] Patient Account Number: [id number] Date of Birth/Sex: Treating RN: [birth year] (26 y.o. America Brown Primary Care Tyliah Schlereth: [name] Other Clinician: Referring Berlie Hatchel: Treating Kamarrion Stfort/Extender: Elsworth Soho in Treatment: 30 Wound Status Wound Number: 8 Primary Etiology: Lupus Wound Location: Right, Anterior Lower Leg Wound Status: Open Wounding Event: Gradually Appeared Comorbid History: Asthma, Lupus Erythematosus Date Acquired: 08/28/2021 Weeks Of  Treatment: 1 Clustered Wound: No Photos Wound Measurements Length: (cm) 1.2 Width: (cm) 0.5 Depth: (cm) 0.2 Area: (cm) 0.471 Volume: (cm) 0.094 % Reduction in Area: 7.8% % Reduction in Volume: -84.3% Epithelialization: None Tunneling: No Undermining: No Wound Description Classification: Full Thickness Without Exposed Support Structures Wound Margin: Flat and Intact Exudate Amount: Medium Exudate Type: Serous Exudate Color: amber Foul Odor After Cleansing: No Slough/Fibrino Yes Wound Bed Granulation Amount: None Present (0%) Exposed Structure Necrotic Amount: Large (67-100%) Fascia Exposed: No Necrotic Quality: Adherent Slough Fat Layer (Subcutaneous Tissue) Exposed: Yes Tendon Exposed: No Muscle Exposed: No Joint Exposed: No Bone Exposed: No Treatment Notes Wound #8 (Lower Leg) Wound Laterality: Right, Anterior Cleanser Peri-Wound Care Topical Primary Dressing Santyl Ointment Discharge Instruction: Apply nickel thick amount to wound bed as instructed Secondary Dressing Woven Gauze Sponge, Non-Sterile 4x4 in Discharge Instruction: Apply over primary dressing as directed. Secured With Conforming Stretch Gauze Bandage, Sterile 2x75 (in/in) Discharge Instruction: Secure with stretch gauze as directed. Compression Wrap Compression Stockings Add-Ons Electronic Signature(s) Signed: 09/12/2021 11:39:47 AM By: Baruch Gouty RN, BSN Signed: 09/12/2021 3:29:02 PM By: Dellie Catholic RN Entered By: Baruch Gouty on 09/11/2021 15:29:10 --------------------------------------------------------------------------------  Vitals Details Patient Name: Date of Service: Kelly [name], DEPP. 09/11/2021 3:00 PM Medical Record Number: [id number] Patient Account Number: [id number] Date of Birth/Sex: Treating RN: 02/22/1996 (26 y.o. America Brown Primary Care V[place]awaz: [name] Other Clinician: Referring Wenda Vanschaick: Treating Tyshell Ramberg/Extender: Elsworth Soho in Treatment: 30 Vital Signs Time Taken: 15:14 Temperature (F): 98.4 Height (in): 62 Pulse (bpm): 87 Respiratory Rate (breaths/min): 18 Blood Pressure (mmHg): 135/94 Reference Range: 80 - 120 mg / dl Electronic Signature(s) Signed: 09/12/2021 3:29:02 PM By: Dellie Catholic RN Entered By: Dellie Catholic on 09/11/2021 15:15:13

## 2021-09-12 NOTE — Progress Notes (Signed)
Kelly [name] ([id number]) Visit Report for 09/11/2021 Chief Complaint Document Details Patient Name: Date of Service: [name], [name] 09/11/2021 3:00 PM Medical Record Number: [id number] Patient Account Number: [id number] Date of Birth/Sex: Treating RN: [birth year] (25 y.o. Kelly [name] Primary Care Provider: Dione Booze Other Clinician: Referring Provider: Treating Provider/Extender: Gennaro Africa in Treatment: 30 Information Obtained from: Patient Chief Complaint Bilateral lower extremity wounds 04/21/21: left lower extremity wound s/p cellulitis 09/01/21; right lower extremity wound Electronic Signature(s) Signed: 09/12/2021 9:05:14 AM By: Geralyn Corwin DO Entered By: Geralyn Corwin on 09/11/2021 16:05:55 -------------------------------------------------------------------------------- Debridement Details Patient Name: Date of Service: Kelly Milling [name] 09/11/2021 3:00 PM Medical Record Number: [id number] Patient Account Number: [id number] Date of Birth/Sex: Treating RN: 08-12-1996 (25 y.o. Kelly [name], Kelly [name] Primary Care Provider: Dione Booze Other Clinician: Referring Provider: Treating Provider/Extender: Gennaro Africa in Treatment: 30 Debridement Performed for Assessment: Wound #8 Right,Anterior Lower Leg Performed By: Physician Geralyn Corwin, DO Debridement Type: Debridement Level of Consciousness (Pre-procedure): Awake and Alert Pre-procedure Verification/Time Out Yes - 15:35 Taken: Start Time: 15:36 Pain Control: Other : benzocaine 20% spray T Area Debrided (L x W): otal 1.2 (cm) x 0.5 (cm) = 0.6 (cm) Tissue and other material debrided: Viable, Non-Viable, Slough, Subcutaneous, Slough Level: Skin/Subcutaneous Tissue Debridement Description: Excisional Instrument: Curette Bleeding: Minimum Hemostasis Achieved: Pressure Procedural Pain: 9 Post Procedural Pain: 8 Response to  Treatment: Procedure was tolerated well Level of Consciousness (Post- Awake and Alert procedure): Post Debridement Measurements of Total Wound Length: (cm) 1.2 Width: (cm) 0.5 Depth: (cm) 0.2 Volume: (cm) 0.094 Character of Wound/Ulcer Post Debridement: Requires Further Debridement Post Procedure Diagnosis Same as Pre-procedure Electronic Signature(s) Signed: 09/12/2021 9:05:14 AM By: Geralyn Corwin DO Signed: 09/12/2021 11:39:47 AM By: Zenaida Deed RN, BSN Entered By: Zenaida Deed on 09/11/2021 15:39:31 -------------------------------------------------------------------------------- HPI Details Patient Name: Date of Service: Kelly Milling [name] 09/11/2021 3:00 PM Medical Record Number: [id number] Patient Account Number: [id number] Date of Birth/Sex: Treating RN: [birth year] (25 y.o. Kelly [name] Primary Care Provider: Dione Booze Other Clinician: Referring Provider: Treating Provider/Extender: Gennaro Africa in Treatment: 30 History of Present Illness HPI Description: Admission 6/21 [name] with a past medical history of systemic lupus erythematosus that presents with bilateral lower extremity wounds that started in April 2022. She has had skin issues in the past where she developed very small wounds but these healed with time. She has never had open wounds like she does on her legs. She currently reports minimal drainage to the wounds. She does have pain to these areas but has been overall stable. She denies signs of infection. She has visited the ED for this issue and was recently prescribed doxycycline for possible cellulitis. She follows with [facility] for her SLE and is currently on Plaquenil, prednisone and CellCept. 6/27; patient presents for 1 week follow-up. She states she saw her rheumatologist on 6/22 and prednisone was increased from 10 mg to 20 mg daily. She is currently at the  highest dose of Plaquenil and CellCept and is going to try an infusion later today at her regimen. She reports improvement to the wounds. She has been using collagen with dressing changes. She currently denies signs of infection. Readmission 8/26 Patient presents to clinic today for open wounds to her left lower extremity. Her previous wounds that were treated have closed. She states that on 8/9 she developed cellulitis and was treated with  2 rounds of antibiotics. Her cellulitis has resolved. She had blisters that opened and are now large wounds. She has seen her dermatologist and rheumatologist since she has systemic lupus and a hard time healing. No changes have been made to her medications. She was recommended antibiotic ointment by her dermatologist to her wounds She picked up the antibiotic ointment today.. She reports pain to these areas. She denies purulent drainage, increased warmth or erythema to the left lower extremity. She currently keeps the areas covered. Of note she had an Achilles tendon rupture on her right leg and is currently in a soft cast. 9/13; patient presents for follow-up. She missed her last clinic appointment. She has been using Santyl to the wound beds daily. She has no issues or complaints today. She reports the Santyl is helping clean up the wounds. She currently denies signs of infection. 9/22; patient states that 1 week ago she was seen by dermatology and they placed an Unna boot with Bactroban on her left lower extremity. Today she presents with a Unna boot in place. She currently denies signs of infection. 10/7; patient presents for follow-up. She reports improvement. She has been using Santyl to the areas of nonviable tissue and collagen to granulation tissue. She has been approved for infusions for her lupus. She is going to start these soon. These will be injection she can do at home. She currently denies signs of infection. 10/21; patient presents for follow-up.  She reports continued improvement to her wound healing. She has been using Santyl to all areas except for the superior posterior left leg wound and she is using collagen to this. She has started her injection therapy for lupus. She started 1 week ago. She currently denies signs of infection. 11/3; patient presents for follow-up. She has no issues or complaints today. She is on her third week of injection therapy for lupus and tolerating this well. She has no issues or complaints today. She denies signs of infection. 11/17; patient presents for 2-week follow-up. She reports improvement in wound healing. She denies signs of infection. 12/1; patient presents for follow-up. She has been using collagen to the anterior left lower extremity wounds and Santyl to the left lateral malleolus. She has no issues or complaints today. 12/15; patient presents for follow-up. She is using collagen to the 2 anterior left leg wounds and Santyl to the left lateral malleolus wound. She reports improvement in wound healing. She denies signs of infection. 12/22; patient presents because she is having purulent drainage from her anterior left leg wound. She reports no inciting event and states that the area started draining spontaneously. She was hoping that the drainage would stop however this has not improved. She states this started 5 days ago. She reports tenderness to the wound bed. 12/29; patient presents for follow-up. She was able to take Augmentin for 4 days but did not tolerate the GI side effects. She continues to take doxycycline. She reports improvement in drainage. She no longer has increased warmth to the surrounding wound bed. She ran out of [place] and has been using Vaseline to the wound beds. 1/6; Patient presents for follow-up. She has been taking Keflex without issues. She reports improvement in drainage and wound healing T the left lower o extremity. Unfortunately she developed another wound  spontaneously to the right anterior lower extremity. She denies systemic signs of infection. 1/16; patient presents for follow-up. Patient completed her first prescription of Keflex and has not started the second. She reports no  more drainage to the wound beds. She states there was an issue with insurance however she is going to be able to obtain Santyl soon. She has been using Dakin's wet-to-dry dressings to the right anterior leg and left lateral ankle. She has been using collagen to the distal anterior left leg wound and reports that the proximal anterior left leg wound has healed. She denies systemic signs of infection. Electronic Signature(s) Signed: 09/12/2021 9:05:14 AM By: Geralyn Corwin DO Entered By: Geralyn Corwin on 09/11/2021 16:07:36 -------------------------------------------------------------------------------- Physical Exam Details Patient Name: Date of Service: Kelly Milling [name] 09/11/2021 3:00 PM Medical Record Number: [id number] Patient Account Number: [id number] Date of Birth/Sex: Treating RN: June 01, 1996 (25 y.o. Kelly [name] Primary Care Provider: Dione Booze Other Clinician: Referring Provider: Treating Provider/Extender: Gennaro Africa in Treatment: 30 Constitutional respirations regular, non-labored and within target range for patient.. Cardiovascular 2+ dorsalis pedis/posterior tibialis pulses. Psychiatric pleasant and cooperative. Notes Left lower extremity: 2 wounds present. The anterior proximal wound has epithelialized. The more distal wound has healthy granulation tissue present. The lateral ankle wound has granulation tissue and scant nonviable tissue. No increased warmth or erythema to any of the wound sites. Right lower extremity: 1 wound present to the anterior aspect with nonviable tissue throughout. Tender to the touch. No drainage noted Electronic Signature(s) Signed: 09/12/2021 9:05:14 AM By: Geralyn Corwin  DO Entered By: Geralyn Corwin on 09/11/2021 16:08:31 -------------------------------------------------------------------------------- Physician Orders Details Patient Name: Date of Service: Kelly Milling [name] 09/11/2021 3:00 PM Medical Record Number: [id number] Patient Account Number: [id number] Date of Birth/Sex: Treating RN: March 06, 1996 (25 y.o. Tommye Standard Primary Care Provider: Dione Booze Other Clinician: Referring Provider: Treating Provider/Extender: Gennaro Africa in Treatment: 30 Verbal / Phone Orders: No Diagnosis Coding Follow-up Appointments ppointment in 2 weeks. - Dr. Mikey Bussing Return A Bathing/ Shower/ Hygiene May shower and wash wound with soap and water. Edema Control - Lymphedema / SCD / Other Elevate legs to the level of the heart or above for 30 minutes daily and/or when sitting, a frequency of: - 3-4 times a day throughout the day. Avoid standing for long periods of time. Additional Orders / Instructions Follow Nutritious Diet Wound Treatment Wound #4 - Ankle Wound Laterality: Left, Lateral Cleanser: Soap and Water 1 x Per Day/30 Days Discharge Instructions: May shower and wash wound with dial antibacterial soap and water prior to dressing change. Prim Dressing: Dakin's Solution 0.125%, 16 (oz) 1 x Per Day/30 Days ary Discharge Instructions: Moisten gauze with Dakin's solution Secondary Dressing: Woven Gauze Sponge, Non-Sterile 4x4 in (Generic) 1 x Per Day/30 Days Discharge Instructions: Apply over primary dressing as directed. Secured With: [employer], 4.5x3.1 (in/yd) (Generic) 1 x Per Day/30 Days Discharge Instructions: Secure with Kerlix as directed. Secured With: Paper Tape, 2x10 (in/yd) (Generic) 1 x Per Day/30 Days Discharge Instructions: Secure dressing with tape as directed. Wound #5 - Ankle Wound Laterality: Left, Anterior Cleanser: Soap and Water 1 x Per Day/30 Days Discharge Instructions: May shower  and wash wound with dial antibacterial soap and water prior to dressing change. Prim Dressing: Promogran Prisma Matrix, 4.34 (sq in) (silver collagen) 1 x Per Day/30 Days ary Discharge Instructions: Moisten collagen with saline or hydrogel Secondary Dressing: Woven Gauze Sponge, Non-Sterile 4x4 in (Generic) 1 x Per Day/30 Days Discharge Instructions: Apply over primary dressing as directed. Secured With: [employer], 4.5x3.1 (in/yd) (Generic) 1 x Per Day/30 Days Discharge Instructions: Secure with Kerlix as directed. Secured  With: Paper Tape, 2x10 (in/yd) (Generic) 1 x Per Day/30 Days Discharge Instructions: Secure dressing with tape as directed. Wound #8 - Lower Leg Wound Laterality: Right, Anterior Prim Dressing: Santyl Ointment 1 x Per Day/30 Days ary Discharge Instructions: Apply nickel thick amount to wound bed as instructed Secondary Dressing: Woven Gauze Sponge, Non-Sterile 4x4 in 1 x Per Day/30 Days Discharge Instructions: Apply over primary dressing as directed. Secured With: Insurance underwriterConforming Stretch Gauze Bandage, Sterile 2x75 (in/in) 1 x Per Day/30 Days Discharge Instructions: Secure with stretch gauze as directed. Patient Medications llergies: No Known Allergies A Notifications Medication Indication Start End prior to debridement 09/11/2021 benzocaine DOSE topical 20 % aerosol - aerosol topical Electronic Signature(s) Signed: 09/12/2021 9:05:14 AM By: Geralyn CorwinHoffman, Isauro Skelley DO Entered By: Geralyn CorwinHoffman, Kelcie Currie on 09/11/2021 16:08:47 -------------------------------------------------------------------------------- Problem List Details Patient Name: Date of Service: Kelly MillingNEWKIRK, [name] 09/11/2021 3:00 PM Medical Record Number: [id number] Patient Account Number: [id number] Date of Birth/Sex: Treating RN: [birth year] (25 y.o. Kelly CluckF) Barnhart, Jodi Primary Care Provider: Dione BoozePartridge, James Other Clinician: Referring Provider: Treating Provider/Extender: Gennaro AfricaHoffman, Quita Mcgrory Partridge,  James Weeks in Treatment: 30 Active Problems ICD-10 Encounter Code Description Active Date MDM Diagnosis [id number] Non-pressure chronic ulcer of other part of left lower leg with fat layer exposed11/10/2020 No Yes L97.812 Non-pressure chronic ulcer of other part of right lower leg with fat layer 06/29/2021 No Yes exposed M32.9 Systemic lupus erythematosus, unspecified 02/14/2021 No Yes L03.116 Cellulitis of left lower limb 08/24/2021 No Yes Inactive Problems ICD-10 Code Description Active Date Inactive Date L97.819 Non-pressure chronic ulcer of other part of right lower leg with unspecified severity 02/13/2021 02/13/2021 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 with unspecified severity 02/13/2021 02/13/2021 F62.130QS81.802A Unspecified open wound, left lower leg, initial encounter 04/21/2021 04/21/2021 S91.002A Unspecified open wound, left ankle, initial encounter 04/21/2021 04/21/2021 Resolved Problems Electronic Signature(s) Signed: 09/12/2021 9:05:14 AM By: Geralyn CorwinHoffman, Finley Dinkel DO Entered By: Geralyn CorwinHoffman, Jeroline Wolbert on 09/11/2021 16:05:41 -------------------------------------------------------------------------------- Progress Note Details Patient Name: Date of Service: Kelly MillingNEWKIRK, [name] 09/11/2021 3:00 PM Medical Record Number: [id number] Patient Account Number: [id number] Date of Birth/Sex: Treating RN: [birth year] (25 y.o. Kelly CluckF) Barnhart, Jodi Primary Care Provider: Dione BoozePartridge, James Other Clinician: Referring Provider: Treating Provider/Extender: Gennaro AfricaHoffman, Airik Goodlin Partridge, James Weeks in Treatment: 30 Subjective Chief Complaint Information obtained from Patient Bilateral lower extremity wounds 04/21/21: left lower extremity wound s/p cellulitis 09/01/21; right lower extremity wound History of Present Illness (HPI) Admission 6/21 Ms. Hassel NethJemariah Newkrik is A 26 year old female with a past medical history of systemic lupus erythematosus that presents with bilateral lower extremity  wounds that started in April 2022. She has had skin issues in the past where she developed very small wounds but these healed with time. She has never had open wounds like she does on her legs. She currently reports minimal drainage to the wounds. She does have pain to these areas but has been overall stable. She denies signs of infection. She has visited the ED for this issue and was recently prescribed doxycycline for possible cellulitis. She follows with [facility] for her SLE and is currently on Plaquenil, prednisone and CellCept. 6/27; patient presents for 1 week follow-up. She states she saw her rheumatologist on 6/22 and prednisone was increased from 10 mg to 20 mg daily. She is currently at the highest dose of Plaquenil and CellCept and is going to try an infusion later today at her regimen. She reports improvement to the wounds. She has been using collagen with dressing changes. She currently  denies signs of infection. Readmission 8/26 Patient presents to clinic today for open wounds to her left lower extremity. Her previous wounds that were treated have closed. She states that on 8/9 she developed cellulitis and was treated with 2 rounds of antibiotics. Her cellulitis has resolved. She had blisters that opened and are now large wounds. She has seen her dermatologist and rheumatologist since she has systemic lupus and a hard time healing. No changes have been made to her medications. She was recommended antibiotic ointment by her dermatologist to her wounds She picked up the antibiotic ointment today.. She reports pain to these areas. She denies purulent drainage, increased warmth or erythema to the left lower extremity. She currently keeps the areas covered. Of note she had an Achilles tendon rupture on her right leg and is currently in a soft cast. 9/13; patient presents for follow-up. She missed her last clinic appointment. She has been using Santyl to the wound beds daily. She has  no issues or complaints today. She reports the Santyl is helping clean up the wounds. She currently denies signs of infection. 9/22; patient states that 1 week ago she was seen by dermatology and they placed an Unna boot with Bactroban on her left lower extremity. Today she presents with a Unna boot in place. She currently denies signs of infection. 10/7; patient presents for follow-up. She reports improvement. She has been using Santyl to the areas of nonviable tissue and collagen to granulation tissue. She has been approved for infusions for her lupus. She is going to start these soon. These will be injection she can do at home. She currently denies signs of infection. 10/21; patient presents for follow-up. She reports continued improvement to her wound healing. She has been using Santyl to all areas except for the superior posterior left leg wound and she is using collagen to this. She has started her injection therapy for lupus. She started 1 week ago. She currently denies signs of infection. 11/3; patient presents for follow-up. She has no issues or complaints today. She is on her third week of injection therapy for lupus and tolerating this well. She has no issues or complaints today. She denies signs of infection. 11/17; patient presents for 2-week follow-up. She reports improvement in wound healing. She denies signs of infection. 12/1; patient presents for follow-up. She has been using collagen to the anterior left lower extremity wounds and Santyl to the left lateral malleolus. She has no issues or complaints today. 12/15; patient presents for follow-up. She is using collagen to the 2 anterior left leg wounds and Santyl to the left lateral malleolus wound. She reports improvement in wound healing. She denies signs of infection. 12/22; patient presents because she is having purulent drainage from her anterior left leg wound. She reports no inciting event and states that the area  started draining spontaneously. She was hoping that the drainage would stop however this has not improved. She states this started 5 days ago. She reports tenderness to the wound bed. 12/29; patient presents for follow-up. She was able to take Augmentin for 4 days but did not tolerate the GI side effects. She continues to take doxycycline. She reports improvement in drainage. She no longer has increased warmth to the surrounding wound bed. She [place] of [place] and has been using Vaseline to the wound beds. 1/6; Patient presents for follow-up. She has been taking Keflex without issues. She reports improvement in drainage and wound healing T the left lower o extremity.  Unfortunately she developed another wound spontaneously to the right anterior lower extremity. She denies systemic signs of infection. 1/16; patient presents for follow-up. Patient completed her first prescription of Keflex and has not started the second. She reports no more drainage to the wound beds. She states there was an issue with insurance however she is going to be able to obtain Santyl soon. She has been using Dakin's wet-to-dry dressings to the right anterior leg and left lateral ankle. She has been using collagen to the distal anterior left leg wound and reports that the proximal anterior left leg wound has healed. She denies systemic signs of infection. Patient History Information obtained from Patient. Family History Diabetes - Paternal Grandparents, Hypertension - Paternal Grandparents, Thyroid Problems - Maternal Grandparents,Paternal Grandparents, No family history of Cancer, Heart Disease, Hereditary Spherocytosis, Kidney Disease, Lung Disease, Seizures, Stroke, Tuberculosis. Social History Never smoker, Marital Status - Married, Alcohol Use - Moderate, Drug Use - Current History - Marijuana, Caffeine Use - Daily - Coffee. Medical History Respiratory Patient has history of Asthma Immunological Patient has  history of Lupus Erythematosus Objective Constitutional respirations regular, non-labored and within target range for patient.. Vitals Time Taken: 3:14 PM, Height: 62 in, Temperature: 98.4 F, Pulse: 87 bpm, Respiratory Rate: 18 breaths/min, Blood Pressure: 135/94 mmHg. Cardiovascular 2+ dorsalis pedis/posterior tibialis pulses. Psychiatric pleasant and cooperative. General Notes: Left lower extremity: 2 wounds present. The anterior proximal wound has epithelialized. The more distal wound has healthy granulation tissue present. The lateral ankle wound has granulation tissue and scant nonviable tissue. No increased warmth or erythema to any of the wound sites. Right lower extremity: 1 wound present to the anterior aspect with nonviable tissue throughout. Tender to the touch. No drainage noted Integumentary (Hair, Skin) Wound #4 status is Open. Original cause of wound was Gradually Appeared. The date acquired was: 04/14/2021. The wound has been in treatment 20 weeks. The wound is located on the Left,Lateral Ankle. The wound measures 1.8cm length x 1.4cm width x 0.1cm depth; 1.979cm^2 area and 0.198cm^3 volume. There is Fat Layer (Subcutaneous Tissue) exposed. There is no tunneling or undermining noted. There is a medium amount of serosanguineous drainage noted. The wound margin is distinct with the outline attached to the wound base. There is large (67-100%) pink granulation within the wound bed. There is a small (1-33%) amount of necrotic tissue within the wound bed including Adherent Slough. Wound #5 status is Open. Original cause of wound was Gradually Appeared. The date acquired was: 04/14/2021. The wound has been in treatment 20 weeks. The wound is located on the Left,Anterior Ankle. The wound measures 1.1cm length x 0.3cm width x 0.1cm depth; 0.259cm^2 area and 0.026cm^3 volume. There is Fat Layer (Subcutaneous Tissue) exposed. There is no tunneling or undermining noted. There is a medium  amount of serosanguineous drainage noted. The wound margin is flat and intact. There is large (67-100%) red, pink granulation within the wound bed. There is no necrotic tissue within the wound bed. Wound #7 status is Open. Original cause of wound was Other Lesion. The date acquired was: 05/18/2021. The wound has been in treatment 16 weeks. The wound is located on the Left,Anterior Lower Leg. The wound measures 0cm length x 0cm width x 0cm depth; 0cm^2 area and 0cm^3 volume. There is no tunneling or undermining noted. There is a none present amount of drainage noted. There is no granulation within the wound bed. There is no necrotic tissue within the wound bed. Wound #8 status is Open. Original  cause of wound was Gradually Appeared. The date acquired was: 08/28/2021. The wound has been in treatment 1 weeks. The wound is located on the Right,Anterior Lower Leg. The wound measures 1.2cm length x 0.5cm width x 0.2cm depth; 0.471cm^2 area and 0.094cm^3 volume. There is Fat Layer (Subcutaneous Tissue) exposed. There is no tunneling or undermining noted. There is a medium amount of serous drainage noted. The wound margin is flat and intact. There is no granulation within the wound bed. There is a large (67-100%) amount of necrotic tissue within the wound bed including Adherent Slough. Assessment Active Problems ICD-10 Non-pressure chronic ulcer of other part of left lower leg with fat layer exposed Non-pressure chronic ulcer of other part of right lower leg with fat layer exposed Systemic lupus erythematosus, unspecified Cellulitis of left lower limb Patient's left lower extremity wounds have shown improvement in size and appearance since last clinic visit. She has 2 wounds remaining to this side. I recommended Dakin's to the left lateral ankle and collagen to the anterior leg wound. No signs of infection on exam. I debrided the wound to the right lower extremity. I recommended using Santyl. She can  continue to use Dakin's until she is able to get Santyl. Since she Has no more drainage I recommended not continuing Keflex at this time. She knows to give [place] a call if there are any questions or concerns. Follow-up in 2 weeks. Procedures Wound #8 Pre-procedure diagnosis of Wound #8 is a Lupus located on the Right,Anterior Lower Leg . There was a Excisional Skin/Subcutaneous Tissue Debridement with a total area of 0.6 sq cm performed by Geralyn Corwin, DO. With the following instrument(s): Curette to remove Viable and Non-Viable tissue/material. Material removed includes Subcutaneous Tissue and Slough and after achieving pain control using Other (benzocaine 20% spray). No specimens were taken. A time out was conducted at 15:35, prior to the start of the procedure. A Minimum amount of bleeding was controlled with Pressure. The procedure was tolerated well with a pain level of 9 throughout and a pain level of 8 following the procedure. Post Debridement Measurements: 1.2cm length x 0.5cm width x 0.2cm depth; 0.094cm^3 volume. Character of Wound/Ulcer Post Debridement requires further debridement. Post procedure Diagnosis Wound #8: Same as Pre-Procedure Plan Follow-up Appointments: Return Appointment in 2 weeks. - Dr. Mikey Bussing Bathing/ Shower/ Hygiene: May shower and wash wound with soap and water. Edema Control - Lymphedema / SCD / Other: Elevate legs to the level of the heart or above for 30 minutes daily and/or when sitting, a frequency of: - 3-4 times a day throughout the day. Avoid standing for long periods of time. Additional Orders / Instructions: Follow Nutritious Diet The following medication(s) was prescribed: benzocaine topical 20 % aerosol aerosol topical for prior to debridement was prescribed at facility WOUND #4: - Ankle Wound Laterality: Left, Lateral Cleanser: Soap and Water 1 x Per Day/30 Days Discharge Instructions: May shower and wash wound with dial antibacterial soap and  water prior to dressing change. Prim Dressing: Dakin's Solution 0.125%, 16 (oz) 1 x Per Day/30 Days ary Discharge Instructions: Moisten gauze with Dakin's solution Secondary Dressing: Woven Gauze Sponge, Non-Sterile 4x4 in (Generic) 1 x Per Day/30 Days Discharge Instructions: Apply over primary dressing as directed. Secured With: [employer], 4.5x3.1 (in/yd) (Generic) 1 x Per Day/30 Days Discharge Instructions: Secure with Kerlix as directed. Secured With: Paper T ape, 2x10 (in/yd) (Generic) 1 x Per Day/30 Days Discharge Instructions: Secure dressing with tape as directed. WOUND #5: -  Ankle Wound Laterality: Left, Anterior Cleanser: Soap and Water 1 x Per Day/30 Days Discharge Instructions: May shower and wash wound with dial antibacterial soap and water prior to dressing change. Prim Dressing: Promogran Prisma Matrix, 4.34 (sq in) (silver collagen) 1 x Per Day/30 Days ary Discharge Instructions: Moisten collagen with saline or hydrogel Secondary Dressing: Woven Gauze Sponge, Non-Sterile 4x4 in (Generic) 1 x Per Day/30 Days Discharge Instructions: Apply over primary dressing as directed. Secured With: [employer], 4.5x3.1 (in/yd) (Generic) 1 x Per Day/30 Days Discharge Instructions: Secure with Kerlix as directed. Secured With: Paper T ape, 2x10 (in/yd) (Generic) 1 x Per Day/30 Days Discharge Instructions: Secure dressing with tape as directed. WOUND #8: - Lower Leg Wound Laterality: Right, Anterior Prim Dressing: Santyl Ointment 1 x Per Day/30 Days ary Discharge Instructions: Apply nickel thick amount to wound bed as instructed Secondary Dressing: Woven Gauze Sponge, Non-Sterile 4x4 in 1 x Per Day/30 Days Discharge Instructions: Apply over primary dressing as directed. Secured With: Insurance underwriter, Sterile 2x75 (in/in) 1 x Per Day/30 Days Discharge Instructions: Secure with stretch gauze as directed. 1. In office sharp debridement 2. Dakin's 3.  Santyl 4. Collagen 5. Follow-up in 2 weeks Electronic Signature(s) Signed: 09/12/2021 9:05:14 AM By: Geralyn Corwin DO Entered By: Geralyn Corwin on 09/11/2021 16:10:45 -------------------------------------------------------------------------------- HxROS Details Patient Name: Date of Service: Kelly Milling [name] 09/11/2021 3:00 PM Medical Record Number: [id number] Patient Account Number: [id number] Date of Birth/Sex: Treating RN: 05/27/1996 (25 y.o. Kelly [name] Primary Care Provider: Dione Booze Other Clinician: Referring Provider: Treating Provider/Extender: Gennaro [place] in Treatment: 30 Information Obtained From Patient Respiratory Medical History: Positive for: Asthma Immunological Medical History: Positive for: Lupus Erythematosus Immunizations Pneumococcal Vaccine: Received Pneumococcal Vaccination: No Implantable Devices None Family and Social History Cancer: No; Diabetes: Yes - Paternal Grandparents; Heart Disease: No; Hereditary Spherocytosis: No; Hypertension: Yes - Paternal Grandparents; Kidney Disease: No; Lung Disease: No; Seizures: No; Stroke: No; Thyroid Problems: Yes - Maternal Grandparents,Paternal Grandparents; Tuberculosis: No; Never smoker; Marital Status - Married; Alcohol Use: Moderate; Drug Use: Current History - Marijuana; Caffeine Use: Daily - Coffee; Financial Concerns: No; Food, Clothing or Shelter Needs: No; Support System Lacking: No Electronic Signature(s) Signed: 09/12/2021 9:05:14 AM By: Geralyn Corwin DO Signed: 09/12/2021 3:58:04 PM By: Antonieta Iba Entered By: Geralyn Corwin on 09/11/2021 16:07:42 -------------------------------------------------------------------------------- SuperBill Details Patient Name: Date of Service: Kelly Milling [name] 09/11/2021 Medical Record Number: [id number] Patient Account Number: [id number] Date of Birth/Sex: Treating RN: 08/26/1996 (25 y.o. Kelly [name],  Kelly [name] Primary Care Provider: Dione Booze Other Clinician: Referring Provider: Treating Provider/Extender: Gennaro [place] in Treatment: 30 Diagnosis Coding ICD-10 Codes Code Description [phone] Non-pressure chronic ulcer of other part of left lower leg with fat layer exposed L97.812 Non-pressure chronic ulcer of other part of right lower leg with fat layer exposed M32.9 Systemic lupus erythematosus, unspecified L03.116 Cellulitis of left lower limb Facility Procedures CPT4 Code: [id number] Description: 11042 - DEB SUBQ TISSUE 20 SQ CM/< ICD-10 Diagnosis Description L97.812 Non-pressure chronic ulcer of other part of right lower leg with fat layer exp Modifier: osed Quantity: 1 Physician Procedures : CPT4 Code Description Modifier [id number] 11042 - WC PHYS SUBQ TISS 20 SQ CM ICD-10 Diagnosis Description L97.812 Non-pressure chronic ulcer of other part of right lower leg with fat layer exposed Quantity: 1 Electronic Signature(s) Signed: 09/12/2021 9:05:14 AM By: Geralyn Corwin DO Entered By: Geralyn Corwin on 09/11/2021 16:11:06

## 2021-09-25 ENCOUNTER — Other Ambulatory Visit: Payer: Self-pay

## 2021-09-25 ENCOUNTER — Encounter (HOSPITAL_BASED_OUTPATIENT_CLINIC_OR_DEPARTMENT_OTHER): Payer: BC Managed Care – PPO | Admitting: Internal Medicine

## 2021-09-25 DIAGNOSIS — L03116 Cellulitis of left lower limb: Secondary | ICD-10-CM | POA: Diagnosis not present

## 2021-09-25 DIAGNOSIS — M329 Systemic lupus erythematosus, unspecified: Secondary | ICD-10-CM

## 2021-09-25 DIAGNOSIS — L97812 Non-pressure chronic ulcer of other part of right lower leg with fat layer exposed: Secondary | ICD-10-CM | POA: Diagnosis not present

## 2021-09-25 DIAGNOSIS — L97822 Non-pressure chronic ulcer of other part of left lower leg with fat layer exposed: Secondary | ICD-10-CM | POA: Diagnosis not present

## 2021-09-25 NOTE — Progress Notes (Addendum)
RAMYA, VANBERGEN ([id number]) Visit Report for 09/25/2021 Arrival Information Details Patient Name: Date of Service: SUMIE, REMSEN. 09/25/2021 2:00 PM Medical Record Number: [id number] Patient Account Number: [id number] Date of Birth/Sex: Treating RN: 07-12-1996 (26 y.o. Elam Dutch Primary Care Torina Ey: [name] Other Clinician: Referring Maricella Filyaw: Treating Zuha Dejonge/Extender: Elsworth Soho in Treatment: 12 Visit Information History Since Last Visit Added or deleted any medications: No Patient Arrived: Ambulatory Any new allergies or adverse reactions: No Arrival Time: 14:02 Had a fall or experienced change in No Accompanied By: self activities of daily living that may affect Transfer Assistance: None risk of falls: Patient Identification Verified: Yes Signs or symptoms of abuse/neglect since last visito No Secondary Verification Process Completed: Yes Hospitalized since last visit: No Patient Requires Transmission-Based Precautions: No Implantable device outside of the clinic excluding No Patient Has Alerts: Yes cellular tissue based products placed in the center Patient Alerts: Left ABi=1.18 since last visit: Has Dressing in Place as Prescribed: Yes Pain Present Now: Yes Electronic Signature(s) Signed: 09/25/2021 5:09:42 PM By: Baruch Gouty RN, BSN Entered By: Baruch Gouty on 09/25/2021 14:04:31 -------------------------------------------------------------------------------- Clinic Level of Care Assessment Details Patient Name: Date of Service: TREESA, MCCULLY [name] 09/25/2021 2:00 PM Medical Record Number: [id number] Patient Account Number: [id number] Date of Birth/Sex: Treating RN: [birth year] (26 y.o. Elam Dutch Primary Care Branndon Tuite: [name] Other Clinician: Referring Saleh Ulbrich: Treating Donnavan Covault/Extender: Elsworth Soho in Treatment: 66 Clinic Level of Care Assessment  Items TOOL 4 Quantity Score _0  - 0 Use when only an EandM is performed on FOLLOW-UP visit ASSESSMENTS - Nursing Assessment / Reassessment X- 1 10 Reassessment of Co-morbidities (includes updates in patient status) X- 1 5 Reassessment of Adherence to Treatment Plan ASSESSMENTS - Wound and Skin A ssessment / Reassessment _1  - 0 Simple Wound Assessment / Reassessment - one wound X- 3 5 Complex Wound Assessment / Reassessment - multiple wounds _2  - 0 Dermatologic / Skin Assessment (not related to wound area) ASSESSMENTS - Focused Assessment X- 2 5 Circumferential Edema Measurements - multi extremities _3  - 0 Nutritional Assessment / Counseling / Intervention X- 1 5 Lower Extremity Assessment (monofilament, tuning fork, pulses) _4  - 0 Peripheral Arterial Disease Assessment (using hand held doppler) ASSESSMENTS - Ostomy and/or Continence Assessment and Care _5  - 0 Incontinence Assessment and Management _6  - 0 Ostomy Care Assessment and Management (repouching, etc.) PROCESS - Coordination of Care X - Simple Patient / Family Education for ongoing care 1 15 _7  - 0 Complex (extensive) Patient / Family Education for ongoing care X- 1 10 Staff obtains Programmer, systems, Records, T Results / Process Orders est _8  - 0 Staff telephones HHA, Nursing Homes / Clarify orders / etc _9  - 0 Routine Transfer to another Facility (non-emergent condition) _10  - 0 Routine Hospital Admission (non-emergent condition) _11  - 0 New Admissions / Biomedical engineer / Ordering NPWT Apligraf, etc. , _12  - 0 Emergency Hospital Admission (emergent condition) X- 1 10 Simple Discharge Coordination _13  - 0 Complex (extensive) Discharge Coordination PROCESS - Special Needs _14  - 0 Pediatric / Minor Patient Management _15  - 0 Isolation Patient Management _16  - 0 Hearing / Language / Visual special needs _17  - 0 Assessment of Community assistance (transportation, D/C planning, etc.) _18  - 0 Additional  assistance / Altered mentation _19  - 0 Support Surface(s) Assessment (bed, cushion, seat, etc.) INTERVENTIONS - Wound Cleansing / Measurement _20  - 0 Simple Wound Cleansing - one wound X- 3 5 Complex Wound  Cleansing - multiple wounds X- 1 5 Wound Imaging (photographs - any number of wounds) _0  - 0 Wound Tracing (instead of photographs) _1  - 0 Simple Wound Measurement - one wound X- 2 5 Complex Wound Measurement - multiple wounds INTERVENTIONS - Wound Dressings X - Small Wound Dressing one or multiple wounds 2 10 _2  - 0 Medium Wound Dressing one or multiple wounds _3  - 0 Large Wound Dressing one or multiple wounds X- 1 5 Application of Medications - topical <SWNIOEVOJJKKXFGH>_8<\/EXHBZJIRCVELFYBO>_1  - 0 Application of Medications - injection INTERVENTIONS - Miscellaneous _5  - 0 External ear exam _6  - 0 Specimen Collection (cultures, biopsies, blood, body fluids, etc.) _7  - 0 Specimen(s) / Culture(s) sent or taken to Lab for analysis _8  - 0 Patient Transfer (multiple staff / Civil Service fast streamer / Similar devices) _9  - 0 Simple Staple / Suture removal (25 or less) _10  - 0 Complex Staple / Suture removal (26 or more) _11  - 0 Hypo / Hyperglycemic Management (close monitor of Blood Glucose) _12  - 0 Ankle / Brachial Index (ABI) - do not check if billed separately X- 1 5 Vital Signs Has the patient been seen at the hospital within the last three years: Yes Total Score: 140 Level Of Care: New/Established - Level 4 Electronic Signature(s) Signed: 09/25/2021 5:09:42 PM By: Baruch Gouty RN, BSN Entered By: Baruch Gouty on 09/25/2021 14:55:37 -------------------------------------------------------------------------------- Encounter Discharge Information Details Patient Name: Date of Service: [name] 09/25/2021 2:00 PM Medical Record Number: [id number] Patient Account Number: [id number] Date of Birth/Sex: Treating RN: 08-10-1996 (27 y.o. Elam Dutch Primary Care Murlene Revell: [name] Other  Clinician: Referring Sharay Bellissimo: Treating Feiga Nadel/Extender: Elsworth Soho in Treatment: 84 Encounter Discharge Information Items Discharge Condition: Stable Ambulatory Status: Ambulatory Discharge Destination: Home Transportation: Private Auto Accompanied By: self Schedule Follow-up Appointment: Yes Clinical Summary of Care: Patient Declined Electronic Signature(s) Signed: 09/25/2021 5:09:42 PM By: Baruch Gouty RN, BSN Entered By: Baruch Gouty on 09/25/2021 14:57:45 -------------------------------------------------------------------------------- Lower Extremity Assessment Details Patient Name: Date of Service: [name] 09/25/2021 2:00 PM Medical Record Number: [id number] Patient Account Number: [id number] Date of Birth/Sex: Treating RN: [birth year] (26 y.o. Elam Dutch Primary Care Virat Prather: [name] Other Clinician: Referring Ibrahim Mcpheeters: Treating Kenechukwu Eckstein/Extender: Elsworth Soho in Treatment: 32 Edema Assessment Assessed: Shirlyn Goltz: No] Patrice Paradise: No] Edema: [Left: No] [Right: Yes] Calf Left: Right: Point of Measurement: 27 cm From Medial Instep 34.5 cm 33 cm Ankle Left: Right: Point of Measurement: 8 cm From Medial Instep 23 cm 22 cm Vascular Assessment Pulses: Dorsalis Pedis Palpable: [Left:Yes] [Right:Yes] Electronic Signature(s) Signed: 09/25/2021 5:09:42 PM By: Baruch Gouty RN, BSN Entered By: Baruch Gouty on 09/25/2021 14:15:16 -------------------------------------------------------------------------------- Multi Wound Chart Details Patient Name: Date of Service: [name] 09/25/2021 2:00 PM Medical Record Number: [id number] Patient Account Number: [id number] Date of Birth/Sex: Treating RN: Oct 26, 1995 (26 y.o. Sue Lush Primary Care Reyes Fifield: [name] Other Clinician: Referring Woodfin Kiss: Treating Deven Audi/Extender: Elsworth Soho in Treatment: 32 Vital Signs Height(in): 62 Pulse(bpm): 93 Weight(lbs): Blood Pressure(mmHg): 116/76 Body Mass Index(BMI): Temperature(F): 98.7 Respiratory Rate(breaths/min): 18 Photos: Left, Lateral Ankle Left, Anterior Ankle Right, Anterior Lower Leg Wound Location: Gradually Appeared Gradually Appeared Gradually Appeared Wounding Event: Cellulitis Cellulitis Lupus Primary Etiology: Lupus Lupus N/A Secondary Etiology: Asthma, Lupus Erythematosus Asthma, Lupus Erythematosus Asthma, Lupus Erythematosus Comorbid History: 04/14/2021 04/14/2021 08/28/2021 Date Acquired: _13 Weeks of Treatment: Open Healed - Epithelialized Open Wound Status: No No No  Wound Recurrence: 1.7x1.2x0.1 0x0x0 1.6x1.3x0.4 Measurements L x W x D (cm) 1.602 0 1.634 A (cm) : rea 0.16 0 0.653 Volume (cm) : 89.80% 100.00% -219.80% % Reduction in A rea: 97.50% 100.00% -1180.40% % Reduction in Volume: Full Thickness Without Exposed Full Thickness Without Exposed Full Thickness Without Exposed Classification: Support Structures Support Structures Support Structures Medium None Present Medium Exudate Amount: Serosanguineous N/A Serous Exudate Type: red, brown N/A amber Exudate Color: Distinct, outline attached Flat and Intact Flat and Intact Wound Margin: Large (67-100%) None Present (0%) Large (67-100%) Granulation Amount: Pink N/A Red Granulation Quality: Small (1-33%) None Present (0%) Small (1-33%) Necrotic Amount: Fat Layer (Subcutaneous Tissue): Yes Fascia: No Fat Layer (Subcutaneous Tissue): Yes Exposed Structures: Fascia: No Fat Layer (Subcutaneous Tissue): No Fascia: No Tendon: No Tendon: No Tendon: No Muscle: No Muscle: No Muscle: No Joint: No Joint: No Joint: No Bone: No Bone: No Bone: No Small (1-33%) Large (67-100%) None Epithelialization: Treatment Notes Wound #4 (Ankle) Wound Laterality: Left, Lateral Cleanser Soap and Water Discharge  Instruction: May shower and wash wound with dial antibacterial soap and water prior to dressing change. Peri-Wound Care Topical Primary Dressing MediHoney Gel, tube 1.5 (oz) Discharge Instruction: Apply to wound bed as instructed Secondary Dressing Woven Gauze Sponge, Non-Sterile 4x4 in Discharge Instruction: Apply over primary dressing as directed. Secured With [employer], 4.5x3.1 (in/yd) Discharge Instruction: Secure with Kerlix as directed. Paper Tape, 2x10 (in/yd) Discharge Instruction: Secure dressing with tape as directed. Compression Wrap Compression Stockings Add-Ons Wound #5 (Ankle) Wound Laterality: Left, Anterior Cleanser Peri-Wound Care Topical Primary Dressing Secondary Dressing Secured With Compression Wrap Compression Stockings Add-Ons Wound #8 (Lower Leg) Wound Laterality: Right, Anterior Cleanser Peri-Wound Care hydrocortisone cream Discharge Instruction: apply to red, irritated periwound Topical Primary Dressing MediHoney Gel, tube 1.5 (oz) Discharge Instruction: or Manuka honey Apply to wound bed as instructed Secondary Dressing Woven Gauze Sponge, Non-Sterile 4x4 in Discharge Instruction: Apply over primary dressing as directed. Secured With Conforming Stretch Gauze Bandage, Sterile 2x75 (in/in) Discharge Instruction: Secure with stretch gauze as directed. Compression Wrap Compression Stockings Add-Ons Electronic Signature(s) Signed: 09/26/2021 12:13:17 PM By: Kalman Shan DO Signed: 09/26/2021 4:25:26 PM By: Lorrin Jackson Entered By: Kalman Shan on 09/26/2021 11:53:26 -------------------------------------------------------------------------------- Multi-Disciplinary Care Plan Details Patient Name: Date of Service: [name] 09/25/2021 2:00 PM Medical Record Number: [id number] Patient Account Number: [id number] Date of Birth/Sex: Treating RN: [birth year] (26 y.o. Elam Dutch Primary Care Pruitt Taboada:  [name] Other Clinician: Referring Lemario Chaikin: Treating Illana Nolting/Extender: Elsworth Soho in Treatment: Ellsworth reviewed with physician Active Inactive Wound/Skin Impairment Nursing Diagnoses: Impaired tissue integrity Goals: Patient/caregiver will verbalize understanding of skin care regimen Date Initiated: 02/13/2021 Target Resolution Date: 10/27/2021 Goal Status: Active Ulcer/skin breakdown will have a volume reduction of 30% by week 4 Date Initiated: 02/13/2021 Date Inactivated: 04/21/2021 Target Resolution Date: 03/13/2021 Unmet Reason: pt did not return to Goal Status: Unmet clinic for F/U Ulcer/skin breakdown will have a volume reduction of 80% by week 12 Date Initiated: 04/21/2021 Date Inactivated: 07/13/2021 Target Resolution Date: 07/27/2021 Goal Status: Met Interventions: Assess patient/caregiver ability to obtain necessary supplies Assess patient/caregiver ability to perform ulcer/skin care regimen upon admission and as needed Assess ulceration(s) every visit Provide education on ulcer and skin care Treatment Activities: Skin care regimen initiated : 02/13/2021 Topical wound management initiated : 02/13/2021 Notes: 06/29/21: Wounds 3 and 6 only at 80% volume reduction. Target date extended. Electronic Signature(s) Signed: 09/25/2021 5:09:42 PM By:  Baruch Gouty RN, BSN Entered By: Baruch Gouty on 09/25/2021 14:20:02 -------------------------------------------------------------------------------- Pain Assessment Details Patient Name: Date of Service: GARA, KINCADE. 09/25/2021 2:00 PM Medical Record Number: [id number] Patient Account Number: [id number] Date of Birth/Sex: Treating RN: 1996/01/16 (26 y.o. Elam Dutch Primary Care Lidya Mccalister: [name] Other Clinician: Referring Benji Poynter: Treating Luciano Cinquemani/Extender: Elsworth Soho in Treatment: 32 Active  Problems Location of Pain Severity and Description of Pain Patient Has Paino Yes Site Locations Pain Location: Pain in Ulcers With Dressing Change: Yes Duration of the Pain. Constant / Intermittento Constant Rate the pain. Current Pain Level: 6 Worst Pain Level: 8 Least Pain Level: 5 Character of Pain Describe the Pain: Aching Pain Management and Medication Current Pain Management: Medication: Yes Is the Current Pain Management Adequate: Adequate Rest: Yes How does your wound impact your activities of daily livingo Sleep: Yes Bathing: No Appetite: No Relationship With Others: No Bladder Continence: No Emotions: Yes Bowel Continence: No Work: No Toileting: No Drive: No Dressing: No Hobbies: No Electronic Signature(s) Signed: 09/25/2021 5:09:42 PM By: Baruch Gouty RN, BSN Entered By: Baruch Gouty on 09/25/2021 14:08:19 -------------------------------------------------------------------------------- Patient/Caregiver Education Details Patient Name: Date of Service: Fransico Setters 1/30/2023andnbsp2:00 PM Medical Record Number: [id number] Patient Account Number: [id number] Date of Birth/Gender: Treating RN: July 04, 1996 (26 y.o. Elam Dutch Primary Care Physician: [name] Other Clinician: Referring Physician: Treating Physician/Extender: Elsworth Soho in Treatment: 42 Education Assessment Education Provided To: Patient Education Topics Provided Wound/Skin Impairment: Handouts: Skin Care Do's and Dont's Methods: Explain/Verbal Responses: Reinforcements needed Electronic Signature(s) Signed: 09/25/2021 5:09:42 PM By: Baruch Gouty RN, BSN Entered By: Baruch Gouty on 09/25/2021 14:20:19 -------------------------------------------------------------------------------- Wound Assessment Details Patient Name: Date of Service: Earlie Lou RIA Cipriano Bunker. 09/25/2021 2:00 PM Medical Record Number: [id number] Patient  Account Number: [id number] Date of Birth/Sex: Treating RN: [birth year] (26 y.o. [name], [name] Primary Care Nichole Neyer: [name] Other Clinician: Referring Greysen Devino: Treating Lela Murfin/Extender: Elsworth Soho in Treatment: 32 Wound Status Wound Number: 4 Primary Etiology: Cellulitis Wound Location: Left, Lateral Ankle Secondary Etiology: Lupus Wounding Event: Gradually Appeared Wound Status: Open Date Acquired: 04/14/2021 Comorbid History: Asthma, Lupus Erythematosus Weeks Of Treatment: 22 Clustered Wound: No Photos Wound Measurements Length: (cm) 1.7 Width: (cm) 1.2 Depth: (cm) 0.1 Area: (cm) 1.602 Volume: (cm) 0.16 % Reduction in Area: 89.8% % Reduction in Volume: 97.5% Epithelialization: Small (1-33%) Tunneling: No Undermining: No Wound Description Classification: Full Thickness Without Exposed Support Structures Wound Margin: Distinct, outline attached Exudate Amount: Medium Exudate Type: Serosanguineous Exudate Color: red, brown Foul Odor After Cleansing: No Slough/Fibrino No Wound Bed Granulation Amount: Large (67-100%) Exposed Structure Granulation Quality: Pink Fascia Exposed: No Necrotic Amount: Small (1-33%) Fat Layer (Subcutaneous Tissue) Exposed: Yes Tendon Exposed: No Muscle Exposed: No Joint Exposed: No Bone Exposed: No Treatment Notes Wound #4 (Ankle) Wound Laterality: Left, Lateral Cleanser Soap and Water Discharge Instruction: May shower and wash wound with dial antibacterial soap and water prior to dressing change. Peri-Wound Care Topical Primary Dressing MediHoney Gel, tube 1.5 (oz) Discharge Instruction: Apply to wound bed as instructed Secondary Dressing Woven Gauze Sponge, Non-Sterile 4x4 in Discharge Instruction: Apply over primary dressing as directed. Secured With [employer], 4.5x3.1 (in/yd) Discharge Instruction: Secure with Kerlix as directed. Paper Tape, 2x10 (in/yd) Discharge  Instruction: Secure dressing with tape as directed. Compression Wrap Compression Stockings Add-Ons Electronic Signature(s) Signed: 09/25/2021 5:09:42 PM By: Baruch Gouty RN, BSN Entered By: Baruch Gouty on 09/25/2021 14:38:33 --------------------------------------------------------------------------------  Wound Assessment Details Patient Name: Date of Service: JALONI, SORBER. 09/25/2021 2:00 PM Medical Record Number: [id number] Patient Account Number: [id number] Date of Birth/Sex: Treating RN: [birth year] (26 y.o. [name], [name] Primary Care Mataya Kilduff: [name] Other Clinician: Referring Brayden Brodhead: Treating Jnyah Brazee/Extender: Elsworth Soho in Treatment: 32 Wound Status Wound Number: 5 Primary Etiology: Cellulitis Wound Location: Left, Anterior Ankle Secondary Etiology: Lupus Wounding Event: Gradually Appeared Wound Status: Healed - Epithelialized Date Acquired: 04/14/2021 Comorbid History: Asthma, Lupus Erythematosus Weeks Of Treatment: 22 Clustered Wound: No Photos Wound Measurements Length: (cm) Width: (cm) Depth: (cm) Area: (cm) Volume: (cm) 0 % Reduction in Area: 100% 0 % Reduction in Volume: 100% 0 Epithelialization: Large (67-100%) 0 Tunneling: No 0 Undermining: No Wound Description Classification: Full Thickness Without Exposed Support Structures Wound Margin: Flat and Intact Exudate Amount: None Present Foul Odor After Cleansing: No Slough/Fibrino No Wound Bed Granulation Amount: None Present (0%) Exposed Structure Necrotic Amount: None Present (0%) Fascia Exposed: No Fat Layer (Subcutaneous Tissue) Exposed: No Tendon Exposed: No Muscle Exposed: No Joint Exposed: No Bone Exposed: No Treatment Notes Wound #5 (Ankle) Wound Laterality: Left, Anterior Cleanser Peri-Wound Care Topical Primary Dressing Secondary Dressing Secured With Compression Wrap Compression Stockings Add-Ons Electronic  Signature(s) Signed: 09/25/2021 5:09:42 PM By: Baruch Gouty RN, BSN Entered By: Baruch Gouty on 09/25/2021 14:38:33 -------------------------------------------------------------------------------- Wound Assessment Details Patient Name: Date of Service: [name] 09/25/2021 2:00 PM Medical Record Number: [id number] Patient Account Number: [id number] Date of Birth/Sex: Treating RN: [birth year] (26 y.o. [name], [name] Primary Care Edsel Shives: [name] Other Clinician: Referring Zan Orlick: Treating Aquiles Ruffini/Extender: Elsworth Soho in Treatment: 32 Wound Status Wound Number: 8 Primary Etiology: Lupus Wound Location: Right, Anterior Lower Leg Wound Status: Open Wounding Event: Gradually Appeared Comorbid History: Asthma, Lupus Erythematosus Date Acquired: 08/28/2021 Weeks Of Treatment: 3 Clustered Wound: No Photos Wound Measurements Length: (cm) 1.6 Width: (cm) 1.3 Depth: (cm) 0.4 Area: (cm) 1.634 Volume: (cm) 0.653 % Reduction in Area: -219.8% % Reduction in Volume: -1180.4% Epithelialization: None Tunneling: No Undermining: No Wound Description Classification: Full Thickness Without Exposed Support Structures Wound Margin: Flat and Intact Exudate Amount: Medium Exudate Type: Serous Exudate Color: amber Foul Odor After Cleansing: No Slough/Fibrino Yes Wound Bed Granulation Amount: Large (67-100%) Exposed Structure Granulation Quality: Red Fascia Exposed: No Necrotic Amount: Small (1-33%) Fat Layer (Subcutaneous Tissue) Exposed: Yes Necrotic Quality: Adherent Slough Tendon Exposed: No Muscle Exposed: No Joint Exposed: No Bone Exposed: No Treatment Notes Wound #8 (Lower Leg) Wound Laterality: Right, Anterior Cleanser Peri-Wound Care hydrocortisone cream Discharge Instruction: apply to red, irritated periwound Topical Primary Dressing MediHoney Gel, tube 1.5 (oz) Discharge Instruction: or Manuka honey Apply  to wound bed as instructed Secondary Dressing Woven Gauze Sponge, Non-Sterile 4x4 in Discharge Instruction: Apply over primary dressing as directed. Secured With Conforming Stretch Gauze Bandage, Sterile 2x75 (in/in) Discharge Instruction: Secure with stretch gauze as directed. Compression Wrap Compression Stockings Add-Ons Electronic Signature(s) Signed: 09/25/2021 5:09:42 PM By: Baruch Gouty RN, BSN Signed: 09/25/2021 5:33:54 PM By: Deon Pilling RN, BSN Entered By: Deon Pilling on 09/25/2021 14:16:56 -------------------------------------------------------------------------------- Vitals Details Patient Name: Date of Service: [name] 09/25/2021 2:00 PM Medical Record Number: [id number] Patient Account Number: [id number] Date of Birth/Sex: Treating RN: 1996/07/28 (26 y.o. Elam Dutch Primary Care Pauline Pegues: [name] Other Clinician: Referring Selso Mannor: Treating Mikeria Valin/Extender: Elsworth Soho in Treatment: 32 Vital Signs Time Taken: 14:04 Temperature (F): 98.7 Height (in): 62 Pulse (  bpm): 93 Respiratory Rate (breaths/min): 18 Blood Pressure (mmHg): 116/76 Reference Range: 80 - 120 mg / dl Electronic Signature(s) Signed: 09/25/2021 5:09:42 PM By: Baruch Gouty RN, BSN Entered By: Baruch Gouty on 09/25/2021 14:07:33

## 2021-09-26 NOTE — Progress Notes (Signed)
ADIA, MCGAREY ([id number]) Visit Report for 09/25/2021 Chief Complaint Document Details Patient Name: Date of Service: Kelly [name], Kelly [name]. 09/25/2021 2:00 PM Medical Record Number: [id number] Patient Account Number: [id number] Date of Birth/Sex: Treating RN: 20-Jul-1996 (26 y.o. Kelly [name] Primary Care Provider: [name] Other Clinician: Referring Provider: Treating Provider/Extender: Elsworth Soho in Treatment: 27 Information Obtained from: Patient Chief Complaint Bilateral lower extremity wounds 04/21/21: left lower extremity wound s/p cellulitis 09/01/21; right lower extremity wound Electronic Signature(s) Signed: 09/26/2021 12:13:17 PM By: Kalman Shan DO Entered By: Kalman Shan on 09/26/2021 11:55:07 -------------------------------------------------------------------------------- HPI Details Patient Name: Date of Service: Kelly Lou [name] 09/25/2021 2:00 PM Medical Record Number: [id number] Patient Account Number: [id number] Date of Birth/Sex: Treating RN: 1995/12/15 (26 y.o. Kelly [name] Primary Care Provider: [name] Other Clinician: Referring Provider: Treating Provider/Extender: Elsworth Soho in Treatment: 47 History of Present Illness HPI Description: Admission 6/21 Ms. Eshana Kolesar is A 26 year old female with a past medical history of systemic lupus erythematosus that presents with bilateral lower extremity wounds that started in April 2022. She has had skin issues in the past where she developed very small wounds but these healed with time. She has never had open wounds like she does on her legs. She currently reports minimal drainage to the wounds. She does have pain to these areas but has been overall stable. She denies signs of infection. She has visited the ED for this issue and was recently prescribed doxycycline for possible cellulitis. She follows with  [facility] for her SLE and is currently on Plaquenil, prednisone and CellCept. 6/27; patient presents for 1 week follow-up. She states she saw her rheumatologist on 6/22 and prednisone was increased from 10 mg to 20 mg daily. She is currently at the highest dose of Plaquenil and CellCept and is going to try an infusion later today at her regimen. She reports improvement to the wounds. She has been using collagen with dressing changes. She currently denies signs of infection. Readmission 8/26 Patient presents to clinic today for open wounds to her left lower extremity. Her previous wounds that were treated have closed. She states that on 8/9 she developed cellulitis and was treated with 2 rounds of antibiotics. Her cellulitis has resolved. She had blisters that opened and are now large wounds. She has seen her dermatologist and rheumatologist since she has systemic lupus and a hard time healing. No changes have been made to her medications. She was recommended antibiotic ointment by her dermatologist to her wounds She picked up the antibiotic ointment today.. She reports pain to these areas. She denies purulent drainage, increased warmth or erythema to the left lower extremity. She currently keeps the areas covered. Of note she had an Achilles tendon rupture on her right leg and is currently in a soft cast. 9/13; patient presents for follow-up. She missed her last clinic appointment. She has been using Santyl to the wound beds daily. She has no issues or complaints today. She reports the Santyl is helping clean up the wounds. She currently denies signs of infection. 9/22; patient states that 1 week ago she was seen by dermatology and they placed an Unna boot with Bactroban on her left lower extremity. Today she presents with a Unna boot in place. She currently denies signs of infection. 10/7; patient presents for follow-up. She reports improvement. She has been using Santyl to the areas of  nonviable tissue and collagen to granulation tissue. She  has been approved for infusions for her lupus. She is going to start these soon. These will be injection she can do at home. She currently denies signs of infection. 10/21; patient presents for follow-up. She reports continued improvement to her wound healing. She has been using Santyl to all areas except for the superior posterior left leg wound and she is using collagen to this. She has started her injection therapy for lupus. She started 1 week ago. She currently denies signs of infection. 11/3; patient presents for follow-up. She has no issues or complaints today. She is on her third week of injection therapy for lupus and tolerating this well. She has no issues or complaints today. She denies signs of infection. 11/17; patient presents for 2-week follow-up. She reports improvement in wound healing. She denies signs of infection. 12/1; patient presents for follow-up. She has been using collagen to the anterior left lower extremity wounds and Santyl to the left lateral malleolus. She has no issues or complaints today. 12/15; patient presents for follow-up. She is using collagen to the 2 anterior left leg wounds and Santyl to the left lateral malleolus wound. She reports improvement in wound healing. She denies signs of infection. 12/22; patient presents because she is having purulent drainage from her anterior left leg wound. She reports no inciting event and states that the area started draining spontaneously. She was hoping that the drainage would stop however this has not improved. She states this started 5 days ago. She reports tenderness to the wound bed. 12/29; patient presents for follow-up. She was able to take Augmentin for 4 days but did not tolerate the GI side effects. She continues to take doxycycline. She reports improvement in drainage. She no longer has increased warmth to the surrounding wound bed. She ran out of [place] and  has been using Vaseline to the wound beds. 1/6; Patient presents for follow-up. She has been taking Keflex without issues. She reports improvement in drainage and wound healing T the left lower o extremity. Unfortunately she developed another wound spontaneously to the right anterior lower extremity. She denies systemic signs of infection. 1/16; patient presents for follow-up. Patient completed her first prescription of Keflex and has not started the second. She reports no more drainage to the wound beds. She states there was an issue with insurance however she is going to be able to obtain Santyl soon. She has been using Dakin's wet-to-dry dressings to the right anterior leg and left lateral ankle. She has been using collagen to the distal anterior left leg wound and reports that the proximal anterior left leg wound has healed. She denies systemic signs of infection. 1/31; patient presents for follow-up. She reports improvement to the left leg wounds. She reports increased inflammation to the right leg wound. She thinks she is having a reaction to the foam border dressing on the right leg. She is unable to obtain Santyl. She has been using Dakin's wet-to-dry dressings to the right anterior leg and left lateral ankle. She has been using collagen to the distal anterior left leg wound. Electronic Signature(s) Signed: 09/26/2021 12:13:17 PM By: Kalman Shan DO Entered By: Kalman Shan on 09/26/2021 11:56:28 -------------------------------------------------------------------------------- Physical Exam Details Patient Name: Date of Service: Kelly Lou RIA Cipriano Bunker. 09/25/2021 2:00 PM Medical Record Number: [id number] Patient Account Number: [id number] Date of Birth/Sex: Treating RN: 25-Aug-1996 (26 y.o. Kelly [name] Primary Care Provider: [name] Other Clinician: Referring Provider: Treating Provider/Extender: Elsworth Soho in Treatment:  [phone]  Constitutional respirations regular, non-labored and within target range for patient.. Cardiovascular 2+ dorsalis pedis/posterior tibialis pulses. Psychiatric pleasant and cooperative. Notes Left lower extremity: 1 wound remaining to the lateral ankle. This has granulation tissue and scant nonviable tissue. No signs of infection. T the right lower extremity there is an open wound to the anterior aspect with nonviable tissue throughout. Irritation to the periwound. No drainage noted. o Electronic Signature(s) Signed: 09/26/2021 12:13:17 PM By: Kalman Shan DO Entered By: Kalman Shan on 09/26/2021 12:03:59 -------------------------------------------------------------------------------- Physician Orders Details Patient Name: Date of Service: Kelly Lou [name] 09/25/2021 2:00 PM Medical Record Number: [id number] Patient Account Number: [id number] Date of Birth/Sex: Treating RN: 1996/06/14 (26 y.o. Elam Dutch Primary Care Provider: [name] Other Clinician: Referring Provider: Treating Provider/Extender: Elsworth Soho in [id number] Verbal / Phone Orders: No Diagnosis Coding Follow-up Appointments ppointment in 2 weeks. - Dr. Heber [place] Return A Bathing/ Shower/ Hygiene May shower and wash wound with soap and water. Edema Control - Lymphedema / SCD / Other Elevate legs to the level of the heart or above for 30 minutes daily and/or when sitting, a frequency of: - 3-4 times a day throughout the day. Avoid standing for long periods of time. Additional Orders / Instructions Follow Nutritious Diet Wound Treatment Wound #4 - Ankle Wound Laterality: Left, Lateral Cleanser: Soap and Water 1 x Per Day/30 Days Discharge Instructions: May shower and wash wound with dial antibacterial soap and water prior to dressing change. Prim Dressing: MediHoney Gel, tube 1.5 (oz) 1 x Per Day/30 Days ary Discharge Instructions: Apply to wound bed  as instructed Secondary Dressing: Woven Gauze Sponge, Non-Sterile 4x4 in (Generic) 1 x Per Day/30 Days Discharge Instructions: Apply over primary dressing as directed. Secured With: [employer], 4.5x3.1 (in/yd) (Generic) 1 x Per Day/30 Days Discharge Instructions: Secure with Kerlix as directed. Secured With: Paper Tape, 2x10 (in/yd) (Generic) 1 x Per Day/30 Days Discharge Instructions: Secure dressing with tape as directed. Wound #8 - Lower Leg Wound Laterality: Right, Anterior Peri-Wound Care: hydrocortisone cream 1 x Per Day/30 Days Discharge Instructions: apply to red, irritated periwound Prim Dressing: MediHoney Gel, tube 1.5 (oz) 1 x Per Day/30 Days ary Discharge Instructions: or Manuka honey Apply to wound bed as instructed Secondary Dressing: Woven Gauze Sponge, Non-Sterile 4x4 in 1 x Per Day/30 Days Discharge Instructions: Apply over primary dressing as directed. Secured With: Child psychotherapist, Sterile 2x75 (in/in) 1 x Per Day/30 Days Discharge Instructions: Secure with stretch gauze as directed. Electronic Signature(s) Signed: 09/26/2021 12:13:17 PM By: Kalman Shan DO Previous Signature: 09/25/2021 5:09:42 PM Version By: Baruch Gouty RN, BSN Entered By: Kalman Shan on 09/26/2021 12:04:52 -------------------------------------------------------------------------------- Problem List Details Patient Name: Date of Service: Kelly Lou [name] 09/25/2021 2:00 PM Medical Record Number: [id number] Patient Account Number: [id number] Date of Birth/Sex: Treating RN: September 07, 1995 (26 y.o. Kelly [name] Primary Care Provider: [name] Other Clinician: Referring Provider: Treating Provider/Extender: Elsworth Soho in Treatment: 32 Active Problems ICD-10 Encounter Code Description Active Date MDM Diagnosis [phone] Non-pressure chronic ulcer of other part of left lower leg with fat layer exposed11/10/2020 No  Yes L97.812 Non-pressure chronic ulcer of other part of right lower leg with fat layer 06/29/2021 No Yes exposed M32.9 Systemic lupus erythematosus, unspecified 02/14/2021 No Yes L03.116 Cellulitis of left lower limb 08/24/2021 No Yes Inactive Problems ICD-10 Code Description Active Date Inactive Date L97.819 Non-pressure chronic ulcer of other part of right lower leg with  unspecified severity 02/13/2021 02/13/2021 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 with unspecified severity 02/13/2021 02/13/2021 [id number] Unspecified open wound, left lower leg, initial encounter 04/21/2021 04/21/2021 S91.002A Unspecified open wound, left ankle, initial encounter 04/21/2021 04/21/2021 Resolved Problems Electronic Signature(s) Signed: 09/26/2021 12:13:17 PM By: Kalman Shan DO Entered By: Kalman Shan on 09/26/2021 11:53:06 -------------------------------------------------------------------------------- Progress Note Details Patient Name: Date of Service: Kelly Lou [name] 09/25/2021 2:00 PM Medical Record Number: [id number] Patient Account Number: [id number] Date of Birth/Sex: Treating RN: 05/10/1996 (26 y.o. Kelly [name] Primary Care Provider: [name] Other Clinician: Referring Provider: Treating Provider/Extender: Elsworth Soho in Treatment: 32 Subjective Chief Complaint Information obtained from Patient Bilateral lower extremity wounds 04/21/21: left lower extremity wound s/p cellulitis 09/01/21; right lower extremity wound History of Present Illness (HPI) Admission 6/21 Ms. Kamera Kundrat is A 26 year old female with a past medical history of systemic lupus erythematosus that presents with bilateral lower extremity wounds that started in April 2022. She has had skin issues in the past where she developed very small wounds but these healed with time. She has never had open wounds like she does on her legs. She currently  reports minimal drainage to the wounds. She does have pain to these areas but has been overall stable. She denies signs of infection. She has visited the ED for this issue and was recently prescribed doxycycline for possible cellulitis. She follows with [facility] for her SLE and is currently on Plaquenil, prednisone and CellCept. 6/27; patient presents for 1 week follow-up. She states she saw her rheumatologist on 6/22 and prednisone was increased from 10 mg to 20 mg daily. She is currently at the highest dose of Plaquenil and CellCept and is going to try an infusion later today at her regimen. She reports improvement to the wounds. She has been using collagen with dressing changes. She currently denies signs of infection. Readmission 8/26 Patient presents to clinic today for open wounds to her left lower extremity. Her previous wounds that were treated have closed. She states that on 8/9 she developed cellulitis and was treated with 2 rounds of antibiotics. Her cellulitis has resolved. She had blisters that opened and are now large wounds. She has seen her dermatologist and rheumatologist since she has systemic lupus and a hard time healing. No changes have been made to her medications. She was recommended antibiotic ointment by her dermatologist to her wounds She picked up the antibiotic ointment today.. She reports pain to these areas. She denies purulent drainage, increased warmth or erythema to the left lower extremity. She currently keeps the areas covered. Of note she had an Achilles tendon rupture on her right leg and is currently in a soft cast. 9/13; patient presents for follow-up. She missed her last clinic appointment. She has been using Santyl to the wound beds daily. She has no issues or complaints today. She reports the Santyl is helping clean up the wounds. She currently denies signs of infection. 9/22; patient states that 1 week ago she was seen by dermatology and they placed  an Unna boot with Bactroban on her left lower extremity. Today she presents with a Unna boot in place. She currently denies signs of infection. 10/7; patient presents for follow-up. She reports improvement. She has been using Santyl to the areas of nonviable tissue and collagen to granulation tissue. She has been approved for infusions for her lupus. She is going to start these soon. These will be injection  she can do at home. She currently denies signs of infection. 10/21; patient presents for follow-up. She reports continued improvement to her wound healing. She has been using Santyl to all areas except for the superior posterior left leg wound and she is using collagen to this. She has started her injection therapy for lupus. She started 1 week ago. She currently denies signs of infection. 11/3; patient presents for follow-up. She has no issues or complaints today. She is on her third week of injection therapy for lupus and tolerating this well. She has no issues or complaints today. She denies signs of infection. 11/17; patient presents for 2-week follow-up. She reports improvement in wound healing. She denies signs of infection. 12/1; patient presents for follow-up. She has been using collagen to the anterior left lower extremity wounds and Santyl to the left lateral malleolus. She has no issues or complaints today. 12/15; patient presents for follow-up. She is using collagen to the 2 anterior left leg wounds and Santyl to the left lateral malleolus wound. She reports improvement in wound healing. She denies signs of infection. 12/22; patient presents because she is having purulent drainage from her anterior left leg wound. She reports no inciting event and states that the area started draining spontaneously. She was hoping that the drainage would stop however this has not improved. She states this started 5 days ago. She reports tenderness to the wound bed. 12/29; patient presents for  follow-up. She was able to take Augmentin for 4 days but did not tolerate the GI side effects. She continues to take doxycycline. She reports improvement in drainage. She no longer has increased warmth to the surrounding wound bed. She ran out of [place] and has been using Vaseline to the wound beds. 1/6; Patient presents for follow-up. She has been taking Keflex without issues. She reports improvement in drainage and wound healing T the left lower o extremity. Unfortunately she developed another wound spontaneously to the right anterior lower extremity. She denies systemic signs of infection. 1/16; patient presents for follow-up. Patient completed her first prescription of Keflex and has not started the second. She reports no more drainage to the wound beds. She states there was an issue with insurance however she is going to be able to obtain Santyl soon. She has been using Dakin's wet-to-dry dressings to the right anterior leg and left lateral ankle. She has been using collagen to the distal anterior left leg wound and reports that the proximal anterior left leg wound has healed. She denies systemic signs of infection. 1/31; patient presents for follow-up. She reports improvement to the left leg wounds. She reports increased inflammation to the right leg wound. She thinks she is having a reaction to the foam border dressing on the right leg. She is unable to obtain Santyl. She has been using Dakin's wet-to-dry dressings to the right anterior leg and left lateral ankle. She has been using collagen to the distal anterior left leg wound. Patient History Information obtained from Patient. Family History Diabetes - Paternal Grandparents, Hypertension - Paternal Grandparents, Thyroid Problems - Maternal Grandparents,Paternal Grandparents, No family history of Cancer, Heart Disease, Hereditary Spherocytosis, Kidney Disease, Lung Disease, Seizures, Stroke, Tuberculosis. Social History Never smoker,  Marital Status - Married, Alcohol Use - Moderate, Drug Use - Current History - Marijuana, Caffeine Use - Daily - Coffee. Medical History Respiratory Patient has history of Asthma Immunological Patient has history of Lupus Erythematosus Objective Constitutional respirations regular, non-labored and within target range for patient.. Vitals Time  Taken: 2:04 PM, Height: 62 in, Temperature: 98.7 F, Pulse: 93 bpm, Respiratory Rate: 18 breaths/min, Blood Pressure: 116/76 mmHg. Cardiovascular 2+ dorsalis pedis/posterior tibialis pulses. Psychiatric pleasant and cooperative. General Notes: Left lower extremity: 1 wound remaining to the lateral ankle. This has granulation tissue and scant nonviable tissue. No signs of infection. To the right lower extremity there is an open wound to the anterior aspect with nonviable tissue throughout. Irritation to the periwound. No drainage noted. Integumentary (Hair, Skin) Wound #4 status is Open. Original cause of wound was Gradually Appeared. The date acquired was: 04/14/2021. The wound has been in treatment 22 weeks. The wound is located on the Left,Lateral Ankle. The wound measures 1.7cm length x 1.2cm width x 0.1cm depth; 1.602cm^2 area and 0.16cm^3 volume. There is Fat Layer (Subcutaneous Tissue) exposed. There is no tunneling or undermining noted. There is a medium amount of serosanguineous drainage noted. The wound margin is distinct with the outline attached to the wound base. There is large (67-100%) pink granulation within the wound bed. There is a small (1-33%) amount of necrotic tissue within the wound bed. Wound #5 status is Healed - Epithelialized. Original cause of wound was Gradually Appeared. The date acquired was: 04/14/2021. The wound has been in treatment 22 weeks. The wound is located on the Left,Anterior Ankle. The wound measures 0cm length x 0cm width x 0cm depth; 0cm^2 area and 0cm^3 volume. There is no tunneling or undermining noted. There  is a none present amount of drainage noted. The wound margin is flat and intact. There is no granulation within the wound bed. There is no necrotic tissue within the wound bed. Wound #8 status is Open. Original cause of wound was Gradually Appeared. The date acquired was: 08/28/2021. The wound has been in treatment 3 weeks. The wound is located on the Right,Anterior Lower Leg. The wound measures 1.6cm length x 1.3cm width x 0.4cm depth; 1.634cm^2 area and 0.653cm^3 volume. There is Fat Layer (Subcutaneous Tissue) exposed. There is no tunneling or undermining noted. There is a medium amount of serous drainage noted. The wound margin is flat and intact. There is large (67-100%) red granulation within the wound bed. There is a small (1-33%) amount of necrotic tissue within the wound bed including Adherent Slough. Assessment Active Problems ICD-10 Non-pressure chronic ulcer of other part of left lower leg with fat layer exposed Non-pressure chronic ulcer of other part of right lower leg with fat layer exposed Systemic lupus erythematosus, unspecified Cellulitis of left lower limb Patient's wounds are stable. I recommended switching to Medihoney since she cannot afford Santyl. She can use this to the remaining wounds. T the o periwound of the right lower extremity she can use hydrocortisone cream to help with what appears to be an allergic reaction to the silicone foam dressing. She will need to wrap the wound in Kerlix from now on. No signs of infection on exam. Follow-up in 1 week. Plan Follow-up Appointments: Return Appointment in 2 weeks. - Dr. He[place] Bathing/ Shower/ Hygiene: May shower and wash wound with soap and water. Edema Control - Lymphedema / SCD / Other: Elevate legs to the level of the heart or above for 30 minutes daily and/or when sitting, a frequency of: - 3-4 times a day throughout the day. Avoid standing for long periods of time. Additional Orders / Instructions: Follow  Nutritious Diet WOUND #4: - Ankle Wound Laterality: Left, Lateral Cleanser: Soap and Water 1 x Per Day/30 Days Discharge Instructions: May shower and wash wound  with dial antibacterial soap and water prior to dressing change. Prim Dressing: MediHoney Gel, tube 1.5 (oz) 1 x Per Day/30 Days ary Discharge Instructions: Apply to wound bed as instructed Secondary Dressing: Woven Gauze Sponge, Non-Sterile 4x4 in (Generic) 1 x Per Day/30 Days Discharge Instructions: Apply over primary dressing as directed. Secured With: [employer], 4.5x3.1 (in/yd) (Generic) 1 x Per Day/30 Days Discharge Instructions: Secure with Kerlix as directed. Secured With: Paper T ape, 2x10 (in/yd) (Generic) 1 x Per Day/30 Days Discharge Instructions: Secure dressing with tape as directed. WOUND #8: - Lower Leg Wound Laterality: Right, Anterior Peri-Wound Care: hydrocortisone cream 1 x Per Day/30 Days Discharge Instructions: apply to red, irritated periwound Prim Dressing: MediHoney Gel, tube 1.5 (oz) 1 x Per Day/30 Days ary Discharge Instructions: or Manuka honey Apply to wound bed as instructed Secondary Dressing: Woven Gauze Sponge, Non-Sterile 4x4 in 1 x Per Day/30 Days Discharge Instructions: Apply over primary dressing as directed. Secured With: Child psychotherapist, Sterile 2x75 (in/in) 1 x Per Day/30 Days Discharge Instructions: Secure with stretch gauze as directed. 1. Medihoney 2. Hydrocortisone cream 3. Follow-up in 1 week Electronic Signature(s) Signed: 09/26/2021 12:13:17 PM By: Kalman Shan DO Entered By: Kalman Shan on 09/26/2021 12:12:02 -------------------------------------------------------------------------------- HxROS Details Patient Name: Date of Service: Kelly Lou [name] 09/25/2021 2:00 PM Medical Record Number: [id number] Patient Account Number: [id number] Date of Birth/Sex: Treating RN: 01-19-1996 (26 y.o. Kelly [name] Primary Care Provider: [name] Other Clinician: Referring Provider: Treating Provider/Extender: Elsworth Soho in Treatment: 16 Information Obtained From Patient Respiratory Medical History: Positive for: Asthma Immunological Medical History: Positive for: Lupus Erythematosus Immunizations Pneumococcal Vaccine: Received Pneumococcal Vaccination: No Implantable Devices None Family and Social History Cancer: No; Diabetes: Yes - Paternal Grandparents; Heart Disease: No; Hereditary Spherocytosis: No; Hypertension: Yes - Paternal Grandparents; Kidney Disease: No; Lung Disease: No; Seizures: No; Stroke: No; Thyroid Problems: Yes - Maternal Grandparents,Paternal Grandparents; Tuberculosis: No; Never smoker; Marital Status - Married; Alcohol Use: Moderate; Drug Use: Current History - Marijuana; Caffeine Use: Daily - Coffee; Financial Concerns: No; Food, Clothing or Shelter Needs: No; Support System Lacking: No Electronic Signature(s) Signed: 09/26/2021 12:13:17 PM By: Kalman Shan DO Signed: 09/26/2021 4:25:26 PM By: Lorrin Jackson Entered By: Kalman Shan on 09/26/2021 11:58:56 -------------------------------------------------------------------------------- SuperBill Details Patient Name: Date of Service: Kelly Lou RIA Cipriano Bunker. 09/25/2021 Medical Record Number: [id number] Patient Account Number: [id number] Date of Birth/Sex: Treating RN: [birth year] (26 y.o. Kelly [name], Kelly [name] Primary Care Provider: [name] Other Clinician: Referring Provider: Treating Provider/Extender: Elsworth Soho in Treatment: 32 Diagnosis Coding ICD-10 Codes Code Description [phone] Non-pressure chronic ulcer of other part of left lower leg with fat layer exposed L97.812 Non-pressure chronic ulcer of other part of right lower leg with fat layer exposed M32.9 Systemic lupus erythematosus, unspecified L03.116 Cellulitis of left lower limb Facility Procedures CPT4  Code: [id number] Description: 99214 - WOUND CARE VISIT-LEV 4 EST PT Modifier: Quantity: 1 Physician Procedures : CPT4 Code Description Modifier [id number] 99213 - WC PHYS LEVEL 3 - EST PT ICD-10 Diagnosis Description L97.822 Non-pressure chronic ulcer of other part of left lower leg with fat layer exposed L97.812 Non-pressure chronic ulcer of other part of right  lower leg with fat layer exposed M32.9 Systemic lupus erythematosus, unspecified L03.116 Cellulitis of left lower limb Quantity: 1 Electronic Signature(s) Signed: 09/26/2021 12:13:17 PM By: Kalman Shan DO Previous Signature: 09/25/2021 5:09:42 PM Version By: Baruch Gouty RN, BSN Entered By: Kalman Shan on  09/26/2021 12:12:43 °

## 2021-09-30 ENCOUNTER — Other Ambulatory Visit: Payer: Self-pay | Admitting: Obstetrics

## 2021-09-30 DIAGNOSIS — Z30013 Encounter for initial prescription of injectable contraceptive: Secondary | ICD-10-CM

## 2021-10-02 ENCOUNTER — Ambulatory Visit: Payer: BC Managed Care – PPO

## 2021-10-02 ENCOUNTER — Encounter (HOSPITAL_BASED_OUTPATIENT_CLINIC_OR_DEPARTMENT_OTHER): Payer: Medicaid Other | Attending: Internal Medicine | Admitting: Internal Medicine

## 2021-10-09 ENCOUNTER — Encounter (HOSPITAL_BASED_OUTPATIENT_CLINIC_OR_DEPARTMENT_OTHER): Payer: BC Managed Care – PPO | Attending: Internal Medicine | Admitting: Internal Medicine

## 2021-10-09 ENCOUNTER — Other Ambulatory Visit: Payer: Self-pay

## 2021-10-09 DIAGNOSIS — L97812 Non-pressure chronic ulcer of other part of right lower leg with fat layer exposed: Secondary | ICD-10-CM | POA: Insufficient documentation

## 2021-10-09 DIAGNOSIS — L97822 Non-pressure chronic ulcer of other part of left lower leg with fat layer exposed: Secondary | ICD-10-CM | POA: Diagnosis present

## 2021-10-09 DIAGNOSIS — M329 Systemic lupus erythematosus, unspecified: Secondary | ICD-10-CM | POA: Diagnosis not present

## 2021-10-09 NOTE — Progress Notes (Signed)
Kelly [name], Kelly [name] ([id number]) Visit Report for 10/09/2021 Arrival Information Details Patient Name: Date of Service: Kelly [name], Kelly [name]. 10/09/2021 2:15 PM Medical Record Number: [id number] Patient Account Number: [id number] Date of Birth/Sex: Treating RN: [birth year] (26 y.o. America Brown Primary Care : [name] Other Clinician: Referring : Treating /Extender: Elsworth Soho in Treatment: 62 Visit Information History Since Last Visit Added or deleted any medications: No Patient Arrived: Ambulatory Any new allergies or adverse reactions: No Arrival Time: 14:23 Had a fall or experienced change in No Accompanied By: self activities of daily living that may affect Transfer Assistance: None risk of falls: Patient Identification Verified: Yes Signs or symptoms of abuse/neglect since last visito No Secondary Verification Process Completed: Yes Hospitalized since last visit: No Patient Requires Transmission-Based Precautions: No Implantable device outside of the clinic excluding No Patient Has Alerts: Yes cellular tissue based products placed in the center Patient Alerts: Left ABi=1.18 since last visit: Pain Present Now: No Electronic Signature(s) Signed: 10/09/2021 5:28:54 PM By: Dellie Catholic RN Entered By: Dellie Catholic on 10/09/2021 14:24:31 -------------------------------------------------------------------------------- Encounter Discharge Information Details Patient Name: Date of Service: Kelly Lou Kelly H M. 10/09/2021 2:15 PM Medical Record Number: [id number] Patient Account Number: [id number] Date of Birth/Sex: Treating RN: 1995-12-19 (26 y.o. America Brown Primary Care : [name] Other Clinician: Referring : Treating /Extender: Elsworth Soho in Treatment: 34 Encounter Discharge Information Items Post Procedure Vitals Discharge Condition:  Stable Temperature (F): 98.5 Ambulatory Status: Ambulatory Pulse (bpm): 108 Discharge Destination: Home Respiratory Rate (breaths/min): 18 Transportation: Private Auto Blood Pressure (mmHg): 136/89 Accompanied By: self Schedule Follow-up Appointment: Yes Clinical Summary of Care: Patient Declined Electronic Signature(s) Signed: 10/09/2021 5:28:54 PM By: Dellie Catholic RN Entered By: Dellie Catholic on 10/09/2021 17:20:34 -------------------------------------------------------------------------------- Lower Extremity Assessment Details Patient Name: Date of Service: Kelly [name], Kelly Kelly H M. 10/09/2021 2:15 PM Medical Record Number: [id number] Patient Account Number: [id number] Date of Birth/Sex: Treating RN: December 25, 1995 (26 y.o. America Brown Primary Care : [name] Other Clinician: Referring : Treating /Extender: Elsworth Soho in Treatment: 34 Edema Assessment Assessed: Shirlyn Goltz: No] Patrice Paradise: No] Edema: [Left: No] [Right: Yes] Calf Left: Right: Point of Measurement: 27 cm From Medial Instep 33 cm 32 cm Ankle Left: Right: Point of Measurement: 8 cm From Medial Instep 21 cm 23.2 cm Electronic Signature(s) Signed: 10/09/2021 5:28:54 PM By: Dellie Catholic RN Entered By: Dellie Catholic on 10/09/2021 14:26:23 -------------------------------------------------------------------------------- Multi Wound Chart Details Patient Name: Date of Service: Kelly Lou Kelly H M. 10/09/2021 2:15 PM Medical Record Number: [id number] Patient Account Number: [id number] Date of Birth/Sex: Treating RN: [birth year] (26 y.o. Sue Lush Primary Care : [name] Other Clinician: Referring : Treating /Extender: Elsworth Soho in Treatment: 34 Vital Signs Height(in): 62 Pulse(bpm): 108 Weight(lbs): Blood Pressure(mmHg): 136/89 Body Mass Index(BMI): Temperature(F):  98.5 Respiratory Rate(breaths/min): 18 Photos: Left, Lateral Ankle Right, Anterior Lower Leg Anterior Lower Leg Wound Location: Gradually Appeared Gradually Appeared Gradually Appeared Wounding Event: Cellulitis Lupus Lupus Primary Etiology: Lupus N/A N/A Secondary Etiology: Asthma, Lupus Erythematosus Asthma, Lupus Erythematosus Asthma, Lupus Erythematosus Comorbid History: 04/14/2021 08/28/2021 10/09/2021 Date Acquired: 24 5 0 Weeks of Treatment: Open Open Open Wound Status: No No No Wound Recurrence: 1.4x1.4x0.1 1x0.4x0.2 0.5x0.2x0.1 Measurements L x W x D (cm) 1.539 0.314 0.079 A (cm) : rea 0.154 0.063 0.008 Volume (cm) : 90.20% 38.60% 0.00% % Reduction in Area: 97.50% -23.50% 0.00% % Reduction in Volume: Full Thickness Without  Exposed Full Thickness Without Exposed Full Thickness Without Exposed Classification: Support Structures Support Structures Support Structures Medium Medium Medium Exudate A mount: Serosanguineous Serous Serosanguineous Exudate Type: red, brown amber red, brown Exudate Color: Distinct, outline attached Flat and Intact Flat and Intact Wound Margin: Large (67-100%) None Present (0%) Large (67-100%) Granulation A mount: Pink N/A Red Granulation Quality: Small (1-33%) Large (67-100%) None Present (0%) Necrotic A mount: Fat Layer (Subcutaneous Tissue): Yes Fat Layer (Subcutaneous Tissue): Yes Fat Layer (Subcutaneous Tissue): Yes Exposed Structures: Fascia: No Fascia: No Fascia: No Tendon: No Tendon: No Tendon: No Muscle: No Muscle: No Muscle: No Joint: No Joint: No Joint: No Bone: No Bone: No Bone: No Small (1-33%) None None Epithelialization: N/A N/A Debridement - Selective/Open Wound Debridement: Pre-procedure Verification/Time Out N/A N/A 15:05 Taken: N/A N/A Other Pain Control: N/A N/A Slough Tissue Debrided: N/A N/A Non-Viable Tissue Level: N/A N/A 0.1 Debridement A (sq cm): rea N/A N/A  Curette Instrument: N/A N/A Minimum Bleeding: N/A N/A Pressure Hemostasis A chieved: N/A N/A 0 Procedural Pain: N/A N/A 3 Post Procedural Pain: N/A N/A Procedure was tolerated well Debridement Treatment Response: N/A N/A 0.5x0.2x0.1 Post Debridement Measurements L x W x D (cm) N/A N/A 0.008 Post Debridement Volume: (cm) N/A N/A Debridement Procedures Performed: Treatment Notes Electronic Signature(s) Signed: 10/09/2021 3:25:49 PM By: Kalman Shan DO Signed: 10/09/2021 5:31:57 PM By: Lorrin Jackson Entered By: Kalman Shan on 10/09/2021 15:20:56 -------------------------------------------------------------------------------- Multi-Disciplinary Care Plan Details Patient Name: Date of Service: Kelly Lou Kelly H M. 10/09/2021 2:15 PM Medical Record Number: [id number] Patient Account Number: [id number] Date of Birth/Sex: Treating RN: October 03, 1995 (26 y.o. America Brown Primary Care Cayci Mcnabb: [name] Other Clinician: Referring Hildy Nicholl: Treating [name] [name]/Extender: Elsworth Soho in Treatment: [place] reviewed with physician Active Inactive Wound/Skin Impairment Nursing Diagnoses: Impaired tissue integrity Goals: Patient/caregiver will verbalize understanding of skin care regimen Date Initiated: 02/13/2021 Target Resolution Date: 10/27/2021 Goal Status: Active Ulcer/skin breakdown will have a volume reduction of 30% by week 4 Date Initiated: 02/13/2021 Date Inactivated: 04/21/2021 Target Resolution Date: 03/13/2021 Unmet Reason: pt did not return to Goal Status: Unmet clinic for F/U Ulcer/skin breakdown will have a volume reduction of 80% by week 12 Date Initiated: 04/21/2021 Date Inactivated: 07/13/2021 Target Resolution Date: 07/27/2021 Goal Status: Met Interventions: Assess patient/caregiver ability to obtain necessary supplies Assess patient/caregiver ability to perform ulcer/skin care regimen upon  admission and as needed Assess ulceration(s) every visit Provide education on ulcer and skin care Treatment Activities: Skin care regimen initiated : 02/13/2021 Topical wound management initiated : 02/13/2021 Notes: 06/29/21: Wounds 3 and 6 only at 80% volume reduction. Target date extended. Electronic Signature(s) Signed: 10/09/2021 5:28:54 PM By: Dellie Catholic RN Entered By: Dellie Catholic on 10/09/2021 14:33:53 -------------------------------------------------------------------------------- Pain Assessment Details Patient Name: Date of Service: Kelly [name], SAVITT. 10/09/2021 2:15 PM Medical Record Number: [id number] Patient Account Number: [id number] Date of Birth/Sex: Treating RN: [birth year] (26 y.o. America Brown Primary Care Maressa Apollo: [name] Other Clinician: Referring Donya Tomaro: Treating Dyanara Cozza/Extender: Elsworth Soho in Treatment: 34 Active Problems Location of Pain Severity and Description of Pain Patient Has Paino No Site Locations Pain Management and Medication Current Pain Management: Electronic Signature(s) Signed: 10/09/2021 5:28:54 PM By: Dellie Catholic RN Entered By: Dellie Catholic on 10/09/2021 14:25:27 -------------------------------------------------------------------------------- Patient/Caregiver Education Details Patient Name: Date of Service: Kelly [name] 2/13/2023andnbsp2:15 PM Medical Record Number: [id number] Patient Account Number: [id number] Date of Birth/Gender: Treating RN: 01-25-1996 (26 y.o. F)  Dellie Catholic Primary Care Physician: [name] Other Clinician: Referring Physician: Treating Physician/Extender: Elsworth Soho in Treatment: 33 Education Assessment Education Provided To: Patient Education Topics Provided Wound/Skin Impairment: Methods: Explain/Verbal Responses: Return demonstration correctly Electronic Signature(s) Signed: 10/09/2021  5:28:54 PM By: Dellie Catholic RN Entered By: Dellie Catholic on 10/09/2021 14:34:20 -------------------------------------------------------------------------------- Wound Assessment Details Patient Name: Date of Service: Kelly Lou Kelly H M. 10/09/2021 2:15 PM Medical Record Number: [id number] Patient Account Number: [id number] Date of Birth/Sex: Treating RN: May 29, 1996 (26 y.o. America Brown Primary Care : [name] Other Clinician: Referring : Treating /Extender: Elsworth Soho in Treatment: 34 Wound Status Wound Number: 4 Primary Etiology: Cellulitis Wound Location: Left, Lateral Ankle Secondary Etiology: Lupus Wounding Event: Gradually Appeared Wound Status: Open Date Acquired: 04/14/2021 Comorbid History: Asthma, Lupus Erythematosus Weeks Of Treatment: 24 Clustered Wound: No Photos Wound Measurements Length: (cm) 1.4 Width: (cm) 1.4 Depth: (cm) 0.1 Area: (cm) 1.539 Volume: (cm) 0.154 % Reduction in Area: 90.2% % Reduction in Volume: 97.5% Epithelialization: Small (1-33%) Tunneling: No Undermining: No Wound Description Classification: Full Thickness Without Exposed Support Structures Wound Margin: Distinct, outline attached Exudate Amount: Medium Exudate Type: Serosanguineous Exudate Color: red, brown Foul Odor After Cleansing: No Slough/Fibrino Yes Wound Bed Granulation Amount: Large (67-100%) Exposed Structure Granulation Quality: Pink Fascia Exposed: No Necrotic Amount: Small (1-33%) Fat Layer (Subcutaneous Tissue) Exposed: Yes Necrotic Quality: Adherent Slough Tendon Exposed: No Muscle Exposed: No Joint Exposed: No Bone Exposed: No Treatment Notes Wound #4 (Ankle) Wound Laterality: Left, Lateral Cleanser Soap and Water Discharge Instruction: May shower and wash wound with dial antibacterial soap and water prior to dressing change. Peri-Wound Care Topical Primary Dressing MediHoney  Gel, tube 1.5 (oz) Discharge Instruction: Apply to wound bed as instructed Secondary Dressing Woven Gauze Sponge, Non-Sterile 4x4 in Discharge Instruction: Apply over primary dressing as directed. Secured With [employer], 4.5x3.1 (in/yd) Discharge Instruction: Secure with Kerlix as directed. Paper Tape, 2x10 (in/yd) Discharge Instruction: Secure dressing with tape as directed. Compression Wrap Compression Stockings Add-Ons Electronic Signature(s) Signed: 10/09/2021 5:28:54 PM By: Dellie Catholic RN Signed: 10/09/2021 5:37:03 PM By: Levan Hurst RN, BSN Entered By: Levan Hurst on 10/09/2021 14:31:26 -------------------------------------------------------------------------------- Wound Assessment Details Patient Name: Date of Service: Kelly Lou Kelly Cipriano Bunker. 10/09/2021 2:15 PM Medical Record Number: [id number] Patient Account Number: [id number] Date of Birth/Sex: Treating RN: [birth year] (26 y.o. America Brown Primary Care : [name] Other Clinician: Referring : Treating /Extender: Elsworth Soho in Treatment: 34 Wound Status Wound Number: 8 Primary Etiology: Lupus Wound Location: Right, Anterior Lower Leg Wound Status: Open Wounding Event: Gradually Appeared Comorbid History: Asthma, Lupus Erythematosus Date Acquired: 08/28/2021 Weeks Of Treatment: 5 Clustered Wound: No Photos Wound Measurements Length: (cm) 1 Width: (cm) 0.4 Depth: (cm) 0.2 Area: (cm) 0.314 Volume: (cm) 0.063 % Reduction in Area: 38.6% % Reduction in Volume: -23.5% Epithelialization: None Tunneling: No Undermining: No Wound Description Classification: Full Thickness Without Exposed Support Structures Wound Margin: Flat and Intact Exudate Amount: Medium Exudate Type: Serous Exudate Color: amber Foul Odor After Cleansing: No Slough/Fibrino Yes Wound Bed Granulation Amount: None Present (0%) Exposed Structure Necrotic  Amount: Large (67-100%) Fascia Exposed: No Necrotic Quality: Adherent Slough Fat Layer (Subcutaneous Tissue) Exposed: Yes Tendon Exposed: No Muscle Exposed: No Joint Exposed: No Bone Exposed: No Treatment Notes Wound #8 (Lower Leg) Wound Laterality: Right, Anterior Cleanser Peri-Wound Care hydrocortisone cream Discharge Instruction: apply to red, irritated periwound Topical Primary Dressing MediHoney Gel, tube 1.5 (  oz) Discharge Instruction: or [place]uka honey Apply to wound bed as instructed Secondary Dressing Woven Gauze Sponge, Non-Sterile 4x4 in Discharge Instruction: Apply over primary dressing as directed. Secured With Conforming Stretch Gauze Bandage, Sterile 2x75 (in/in) Discharge Instruction: Secure with stretch gauze as directed. Compression Wrap Compression Stockings Add-Ons Electronic Signature(s) Signed: 10/09/2021 5:28:54 PM By: Dellie Catholic RN Signed: 10/09/2021 5:37:03 PM By: Levan Hurst RN, BSN Entered By: Levan Hurst on 10/09/2021 14:32:09 -------------------------------------------------------------------------------- Wound Assessment Details Patient Name: Date of Service: Kelly Lou Kelly Cipriano Bunker. 10/09/2021 2:15 PM Medical Record Number: [id number] Patient Account Number: [id number] Date of Birth/Sex: Treating RN: June 28, 1996 (26 y.o. America Brown Primary Care : [name] Other Clinician: Referring : Treating /Extender: Elsworth Soho in Treatment: 34 Wound Status Wound Number: 9 Primary Etiology: Lupus Wound Location: Anterior Lower Leg Wound Status: Open Wounding Event: Gradually Appeared Comorbid History: Asthma, Lupus Erythematosus Date Acquired: 10/09/2021 Weeks Of Treatment: 0 Clustered Wound: No Photos Wound Measurements Length: (cm) 0.5 Width: (cm) 0.2 Depth: (cm) 0.1 Area: (cm) 0.079 Volume: (cm) 0.008 % Reduction in Area: 0% % Reduction in Volume:  0% Epithelialization: None Tunneling: No Undermining: No Wound Description Classification: Full Thickness Without Exposed Support Structures Wound Margin: Flat and Intact Exudate Amount: Medium Exudate Type: Serosanguineous Exudate Color: red, brown Foul Odor After Cleansing: No Slough/Fibrino No Wound Bed Granulation Amount: Large (67-100%) Exposed Structure Granulation Quality: Red Fascia Exposed: No Necrotic Amount: None Present (0%) Fat Layer (Subcutaneous Tissue) Exposed: Yes Tendon Exposed: No Muscle Exposed: No Joint Exposed: No Bone Exposed: No Electronic Signature(s) Signed: 10/09/2021 5:28:54 PM By: Dellie Catholic RN Signed: 10/09/2021 5:37:03 PM By: Levan Hurst RN, BSN Entered By: Levan Hurst on 10/09/2021 14:33:09 -------------------------------------------------------------------------------- Vitals Details Patient Name: Date of Service: Kelly Lou Kelly H M. 10/09/2021 2:15 PM Medical Record Number: [id number] Patient Account Number: [id number] Date of Birth/Sex: Treating RN: Apr 11, 1996 (26 y.o. America Brown Primary Care : [name] Other Clinician: Referring : Treating /Extender: Elsworth Soho in Treatment: 34 Vital Signs Time Taken: 14:24 Temperature (F): 98.5 Height (in): 62 Pulse (bpm): 108 Respiratory Rate (breaths/min): 18 Blood Pressure (mmHg): 136/89 Reference Range: 80 - 120 mg / dl Electronic Signature(s) Signed: 10/09/2021 5:28:54 PM By: Dellie Catholic RN Entered By: Dellie Catholic on 10/09/2021 14:25:16

## 2021-10-10 NOTE — Progress Notes (Signed)
Kelly, [name] ([id number]) Visit Report for 10/09/2021 Chief Complaint Document Details Patient Name: Date of Service: Kelly, [name]. 10/09/2021 2:15 PM Medical Record Number: [id number] Patient Account Number: [id number] Date of Birth/Sex: Treating RN: [birth year] (25 y.o. [name] Primary Care Provider: Dione Booze Other Clinician: Referring Provider: Treating Provider/Extender: Gennaro Africa in Treatment: 76 Information Obtained from: Patient Chief Complaint Bilateral lower extremity wounds 04/21/21: left lower extremity wound s/p cellulitis 09/01/21; right lower extremity wound Electronic Signature(s) Signed: 10/09/2021 3:25:49 PM By: Geralyn Corwin DO Entered By: Geralyn Corwin on 10/09/2021 15:21:06 -------------------------------------------------------------------------------- Debridement Details Patient Name: Date of Service: Kelly [name] [name] 10/09/2021 2:15 PM Medical Record Number: [id number] Patient Account Number: [id number] Date of Birth/Sex: Treating RN: 1996/07/28 (25 y.o. Katrinka Blazing Primary Care Provider: Dione Booze Other Clinician: Referring Provider: Treating Provider/Extender: Gennaro Africa in Treatment: 34 Debridement Performed for Assessment: Wound #9 Left,Anterior Lower Leg Performed By: Physician Geralyn Corwin, DO Debridement Type: Debridement Level of Consciousness (Pre-procedure): Awake and Alert Pre-procedure Verification/Time Out Yes - 15:05 Taken: Start Time: 15:05 Pain Control: Other : Benzocaine 20% T Area Debrided (L x W): otal 0.5 (cm) x 0.2 (cm) = 0.1 (cm) Tissue and other material debrided: Non-Viable, Slough, Slough Level: Non-Viable Tissue Debridement Description: Selective/Open Wound Instrument: Curette Bleeding: Minimum Hemostasis Achieved: Pressure End Time: 15:07 Procedural Pain: 0 Post Procedural Pain: 3 Response to Treatment:  Procedure was tolerated well Level of Consciousness (Post- Awake and Alert procedure): Post Debridement Measurements of Total Wound Length: (cm) 0.5 Width: (cm) 0.2 Depth: (cm) 0.1 Volume: (cm) 0.008 Character of Wound/Ulcer Post Debridement: Improved Post Procedure Diagnosis Same as Pre-procedure Electronic Signature(s) Signed: 10/09/2021 3:25:49 PM By: Geralyn Corwin DO Signed: 10/09/2021 5:28:54 PM By: Karie Schwalbe RN Entered By: Karie Schwalbe on 10/09/2021 15:20:29 -------------------------------------------------------------------------------- HPI Details Patient Name: Date of Service: Kelly [name] [name] 10/09/2021 2:15 PM Medical Record Number: [id number] Patient Account Number: [id number] Date of Birth/Sex: Treating RN: [birth year] (25 y.o. [name] Primary Care Provider: Dione Booze Other Clinician: Referring Provider: Treating Provider/Extender: Gennaro Africa in Treatment: 34 History of Present Illness HPI Description: Admission 6/21 Kelly [name] is A 26 year old female with a past medical history of systemic lupus erythematosus that presents with bilateral lower extremity wounds that started in April 2022. She has had skin issues in the past where she developed very small wounds but these healed with time. She has never had open wounds like she does on her legs. She currently reports minimal drainage to the wounds. She does have pain to these areas but has been overall stable. She denies signs of infection. She has visited the ED for this issue and was recently prescribed doxycycline for possible cellulitis. She follows with [facility] for her SLE and is currently on Plaquenil, prednisone and CellCept. 6/27; patient presents for 1 week follow-up. She states she saw her rheumatologist on 6/22 and prednisone was increased from 10 mg to 20 mg daily. She is currently at the highest dose of Plaquenil and CellCept and  is going to try an infusion later today at her regimen. She reports improvement to the wounds. She has been using collagen with dressing changes. She currently denies signs of infection. Readmission 8/26 Patient presents to clinic today for open wounds to her left lower extremity. Her previous wounds that were treated have closed. She states that on 8/9 she developed cellulitis and was treated with 2 rounds  of antibiotics. Her cellulitis has resolved. She had blisters that opened and are now large wounds. She has seen her dermatologist and rheumatologist since she has systemic lupus and a hard time healing. No changes have been made to her medications. She was recommended antibiotic ointment by her dermatologist to her wounds She picked up the antibiotic ointment today.. She reports pain to these areas. She denies purulent drainage, increased warmth or erythema to the left lower extremity. She currently keeps the areas covered. Of note she had an Achilles tendon rupture on her right leg and is currently in a soft cast. 9/13; patient presents for follow-up. She missed her last clinic appointment. She has been using Santyl to the wound beds daily. She has no issues or complaints today. She reports the Santyl is helping clean up the wounds. She currently denies signs of infection. 9/22; patient states that 1 week ago she was seen by dermatology and they placed an Unna boot with Bactroban on her left lower extremity. Today she presents with a Unna boot in place. She currently denies signs of infection. 10/7; patient presents for follow-up. She reports improvement. She has been using Santyl to the areas of nonviable tissue and collagen to granulation tissue. She has been approved for infusions for her lupus. She is going to start these soon. These will be injection she can do at home. She currently denies signs of infection. 10/21; patient presents for follow-up. She reports continued improvement to her  wound healing. She has been using Santyl to all areas except for the superior posterior left leg wound and she is using collagen to this. She has started her injection therapy for lupus. She started 1 week ago. She currently denies signs of infection. 11/3; patient presents for follow-up. She has no issues or complaints today. She is on her third week of injection therapy for lupus and tolerating this well. She has no issues or complaints today. She denies signs of infection. 11/17; patient presents for 2-week follow-up. She reports improvement in wound healing. She denies signs of infection. 12/1; patient presents for follow-up. She has been using collagen to the anterior left lower extremity wounds and Santyl to the left lateral malleolus. She has no issues or complaints today. 12/15; patient presents for follow-up. She is using collagen to the 2 anterior left leg wounds and Santyl to the left lateral malleolus wound. She reports improvement in wound healing. She denies signs of infection. 12/22; patient presents because she is having purulent drainage from her anterior left leg wound. She reports no inciting event and states that the area started draining spontaneously. She was hoping that the drainage would stop however this has not improved. She states this started 5 days ago. She reports tenderness to the wound bed. 12/29; patient presents for follow-up. She was able to take Augmentin for 4 days but did not tolerate the GI side effects. She continues to take doxycycline. She reports improvement in drainage. She no longer has increased warmth to the surrounding wound bed. She ran out of BradySantyl and has been using Vaseline to the wound beds. 1/6; Patient presents for follow-up. She has been taking Keflex without issues. She reports improvement in drainage and wound healing T the left lower o extremity. Unfortunately she developed another wound spontaneously to the right anterior lower extremity.  She denies systemic signs of infection. 1/16; patient presents for follow-up. Patient completed her first prescription of Keflex and has not started the second. She reports no more drainage  to the wound beds. She states there was an issue with insurance however she is going to be able to obtain Santyl soon. She has been using Dakin's wet-to-dry dressings to the right anterior leg and left lateral ankle. She has been using collagen to the distal anterior left leg wound and reports that the proximal anterior left leg wound has healed. She denies systemic signs of infection. 1/31; patient presents for follow-up. She reports improvement to the left leg wounds. She reports increased inflammation to the right leg wound. She thinks she is having a reaction to the foam border dressing on the right leg. She is unable to obtain Santyl. She has been using Dakin's wet-to-dry dressings to the right anterior leg and left lateral ankle. She has been using collagen to the distal anterior left leg wound. 2/13; patient presents for follow-up. She reports improvement in her wound healing. One of the wounds to the anterior aspect of the left lower leg has slightly reopened. She denies signs of infection. Electronic Signature(s) Signed: 10/09/2021 3:25:49 PM By: Geralyn Corwin DO Entered By: Geralyn Corwin on 10/09/2021 15:21:48 -------------------------------------------------------------------------------- Physical Exam Details Patient Name: Date of Service: Kelly [name] RIA Cherly Anderson. 10/09/2021 2:15 PM Medical Record Number: [id number] Patient Account Number: [id number] Date of Birth/Sex: Treating RN: 1996/02/10 (25 y.o. [name] Primary Care Provider: Dione Booze Other Clinician: Referring Provider: Treating Provider/Extender: Gennaro Africa in Treatment: 34 Constitutional respirations regular, non-labored and within target range for patient.. Cardiovascular 2+ dorsalis  pedis/posterior tibialis pulses. Psychiatric pleasant and cooperative. Notes Left lower extremity: 2 wounds present. 1 to the anterior aspect with calcium deposit that was easily removed. Granulation tissue to this wound bed. Another wound to the lateral ankle with granulation tissue and scant nonviable tissue. No signs of surrounding infection. T the right lower extremity there is an open wound to the anterior aspect with nonviable tissue throughout. Surrounding skin is intact with no irritation. Again no o surrounding signs of infection. Electronic Signature(s) Signed: 10/09/2021 3:25:49 PM By: Geralyn Corwin DO Entered By: Geralyn Corwin on 10/09/2021 15:22:50 -------------------------------------------------------------------------------- Physician Orders Details Patient Name: Date of Service: Kelly [name] RIA Cherly Anderson. 10/09/2021 2:15 PM Medical Record Number: [id number] Patient Account Number: [id number] Date of Birth/Sex: Treating RN: 1996/04/09 (25 y.o. Katrinka Blazing Primary Care Provider: Dione Booze Other Clinician: Referring Provider: Treating Provider/Extender: Gennaro Africa in Treatment: 53 Verbal / Phone Orders: No Diagnosis Coding Follow-up Appointments ppointment in 2 weeks. - Dr. Mikey Bussing Return A Bathing/ Shower/ Hygiene May shower and wash wound with soap and water. Edema Control - Lymphedema / SCD / Other Elevate legs to the level of the heart or above for 30 minutes daily and/or when sitting, a frequency of: - 3-4 times a day throughout the day. Avoid standing for long periods of time. Additional Orders / Instructions Follow Nutritious Diet Wound Treatment Wound #4 - Ankle Wound Laterality: Left, Lateral Cleanser: Soap and Water 1 x Per Day/30 Days Discharge Instructions: May shower and wash wound with dial antibacterial soap and water prior to dressing change. Prim Dressing: MediHoney Gel, tube 1.5 (oz) 1 x Per Day/30  Days ary Discharge Instructions: Apply to wound bed as instructed Secondary Dressing: Woven Gauze Sponge, Non-Sterile 4x4 in (DME) (Generic) 1 x Per Day/30 Days Discharge Instructions: Apply over primary dressing as directed. Secured With: [employer], 4.5x3.1 (in/yd) (Generic) 1 x Per Day/30 Days Discharge Instructions: Secure with Kerlix as directed. Secured With: Paper Tape, 2x10 (in/yd) (  DME) (Generic) 1 x Per Day/30 Days Discharge Instructions: Secure dressing with tape as directed. Wound #8 - Lower Leg Wound Laterality: Right, Anterior Peri-Wound Care: hydrocortisone cream 1 x Per Day/30 Days Discharge Instructions: apply to red, irritated periwound Prim Dressing: MediHoney Gel, tube 1.5 (oz) 1 x Per Day/30 Days ary Discharge Instructions: or Manuka honey Apply to wound bed as instructed Secondary Dressing: Woven Gauze Sponge, Non-Sterile 4x4 in 1 x Per Day/30 Days Discharge Instructions: Apply over primary dressing as directed. Secured With: Insurance underwriter, Sterile 2x75 (in/in) 1 x Per Day/30 Days Discharge Instructions: Secure with stretch gauze as directed. Wound #9 - Lower Leg Wound Laterality: Left, Anterior Cleanser: Soap and Water Discharge Instructions: May shower and wash wound with dial antibacterial soap and water prior to dressing change. Cleanser: Wound Cleanser Discharge Instructions: Cleanse the wound with wound cleanser prior to applying a clean dressing using gauze sponges, not tissue or cotton balls. Prim Dressing: Promogran Prisma Matrix, 4.34 (sq in) (silver collagen) ary Discharge Instructions: Moisten collagen with saline or hydrogel Prim Dressing: MediHoney Gel, tube 1.5 (oz) ary Discharge Instructions: Apply to wound bed as instructed Secondary Dressing: Woven Gauze Sponge, Non-Sterile 4x4 in Discharge Instructions: Apply over primary dressing as directed. Secondary Dressing: Conforming Stretch Gauze, 2x4.1 (in/yd) Secured With:  Paper Tape, 2x10 (in/yd) Discharge Instructions: Secure dressing with tape as directed. Electronic Signature(s) Signed: 10/09/2021 5:28:54 PM By: Karie Schwalbe RN Signed: 10/10/2021 9:23:45 AM By: Geralyn Corwin DO Previous Signature: 10/09/2021 3:25:49 PM Version By: Geralyn Corwin DO Entered By: Karie Schwalbe on 10/09/2021 17:27:59 -------------------------------------------------------------------------------- Problem List Details Patient Name: Date of Service: Kelly [name] [name] 10/09/2021 2:15 PM Medical Record Number: [id number] Patient Account Number: [id number] Date of Birth/Sex: Treating RN: 06/30/1996 (25 y.o. [name] Primary Care Provider: Dione Booze Other Clinician: Referring Provider: Treating Provider/Extender: Gennaro Africa in Treatment: 34 Active Problems ICD-10 Encounter Code Description Active Date MDM Diagnosis [phone] Non-pressure chronic ulcer of other part of left lower leg with fat layer exposed11/10/2020 No Yes L97.812 Non-pressure chronic ulcer of other part of right lower leg with fat layer 06/29/2021 No Yes exposed M32.9 Systemic lupus erythematosus, unspecified 02/14/2021 No Yes L03.116 Cellulitis of left lower limb 08/24/2021 No Yes Inactive Problems ICD-10 Code Description Active Date Inactive Date L97.819 Non-pressure chronic ulcer of other part of right lower leg with unspecified severity 02/13/2021 02/13/2021 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 with unspecified severity 02/13/2021 02/13/2021 O37.858I Unspecified open wound, left lower leg, initial encounter 04/21/2021 04/21/2021 S91.002A Unspecified open wound, left ankle, initial encounter 04/21/2021 04/21/2021 Resolved Problems Electronic Signature(s) Signed: 10/09/2021 3:25:49 PM By: Geralyn Corwin DO Entered By: Geralyn Corwin on 10/09/2021  15:20:45 -------------------------------------------------------------------------------- Progress Note Details Patient Name: Date of Service: Kelly [name] [name] 10/09/2021 2:15 PM Medical Record Number: [id number] Patient Account Number: [id number] Date of Birth/Sex: Treating RN: 06-01-1996 (25 y.o. [name] Primary Care Provider: Dione Booze Other Clinician: Referring Provider: Treating Provider/Extender: Gennaro Africa in Treatment: 34 Subjective Chief Complaint Information obtained from Patient Bilateral lower extremity wounds 04/21/21: left lower extremity wound s/p cellulitis 09/01/21; right lower extremity wound History of Present Illness (HPI) Admission 6/21 [name] with a past medical history of systemic lupus erythematosus that presents with bilateral lower extremity wounds that started in April 2022. She has had skin issues in the past where she developed very small wounds but these healed with  time. She has never had open wounds like she does on her legs. She currently reports minimal drainage to the wounds. She does have pain to these areas but has been overall stable. She denies signs of infection. She has visited the ED for this issue and was recently prescribed doxycycline for possible cellulitis. She follows with [facility] for her SLE and is currently on Plaquenil, prednisone and CellCept. 6/27; patient presents for 1 week follow-up. She states she saw her rheumatologist on 6/22 and prednisone was increased from 10 mg to 20 mg daily. She is currently at the highest dose of Plaquenil and CellCept and is going to try an infusion later today at her regimen. She reports improvement to the wounds. She has been using collagen with dressing changes. She currently denies signs of infection. Readmission 8/26 Patient presents to clinic today for open wounds to her left lower extremity. Her previous  wounds that were treated have closed. She states that on 8/9 she developed cellulitis and was treated with 2 rounds of antibiotics. Her cellulitis has resolved. She had blisters that opened and are now large wounds. She has seen her dermatologist and rheumatologist since she has systemic lupus and a hard time healing. No changes have been made to her medications. She was recommended antibiotic ointment by her dermatologist to her wounds She picked up the antibiotic ointment today.. She reports pain to these areas. She denies purulent drainage, increased warmth or erythema to the left lower extremity. She currently keeps the areas covered. Of note she had an Achilles tendon rupture on her right leg and is currently in a soft cast. 9/13; patient presents for follow-up. She missed her last clinic appointment. She has been using Santyl to the wound beds daily. She has no issues or complaints today. She reports the Santyl is helping clean up the wounds. She currently denies signs of infection. 9/22; patient states that 1 week ago she was seen by dermatology and they placed an Unna boot with Bactroban on her left lower extremity. Today she presents with a Unna boot in place. She currently denies signs of infection. 10/7; patient presents for follow-up. She reports improvement. She has been using Santyl to the areas of nonviable tissue and collagen to granulation tissue. She has been approved for infusions for her lupus. She is going to start these soon. These will be injection she can do at home. She currently denies signs of infection. 10/21; patient presents for follow-up. She reports continued improvement to her wound healing. She has been using Santyl to all areas except for the superior posterior left leg wound and she is using collagen to this. She has started her injection therapy for lupus. She started 1 week ago. She currently denies signs of infection. 11/3; patient presents for follow-up. She  has no issues or complaints today. She is on her third week of injection therapy for lupus and tolerating this well. She has no issues or complaints today. She denies signs of infection. 11/17; patient presents for 2-week follow-up. She reports improvement in wound healing. She denies signs of infection. 12/1; patient presents for follow-up. She has been using collagen to the anterior left lower extremity wounds and Santyl to the left lateral malleolus. She has no issues or complaints today. 12/15; patient presents for follow-up. She is using collagen to the 2 anterior left leg wounds and Santyl to the left lateral malleolus wound. She reports improvement in wound healing. She denies signs of infection. 12/22; patient presents  because she is having purulent drainage from her anterior left leg wound. She reports no inciting event and states that the area started draining spontaneously. She was hoping that the drainage would stop however this has not improved. She states this started 5 days ago. She reports tenderness to the wound bed. 12/29; patient presents for follow-up. She was able to take Augmentin for 4 days but did not tolerate the GI side effects. She continues to take doxycycline. She reports improvement in drainage. She no longer has increased warmth to the surrounding wound bed. She ran out of [place] and has been using Vaseline to the wound beds. 1/6; Patient presents for follow-up. She has been taking Keflex without issues. She reports improvement in drainage and wound healing T the left lower o extremity. Unfortunately she developed another wound spontaneously to the right anterior lower extremity. She denies systemic signs of infection. 1/16; patient presents for follow-up. Patient completed her first prescription of Keflex and has not started the second. She reports no more drainage to the wound beds. She states there was an issue with insurance however she is going to be able to  obtain Santyl soon. She has been using Dakin's wet-to-dry dressings to the right anterior leg and left lateral ankle. She has been using collagen to the distal anterior left leg wound and reports that the proximal anterior left leg wound has healed. She denies systemic signs of infection. 1/31; patient presents for follow-up. She reports improvement to the left leg wounds. She reports increased inflammation to the right leg wound. She thinks she is having a reaction to the foam border dressing on the right leg. She is unable to obtain Santyl. She has been using Dakin's wet-to-dry dressings to the right anterior leg and left lateral ankle. She has been using collagen to the distal anterior left leg wound. 2/13; patient presents for follow-up. She reports improvement in her wound healing. One of the wounds to the anterior aspect of the left lower leg has slightly reopened. She denies signs of infection. Patient History Information obtained from Patient. Family History Diabetes - Paternal Grandparents, Hypertension - Paternal Grandparents, Thyroid Problems - Maternal Grandparents,Paternal Grandparents, No family history of Cancer, Heart Disease, Hereditary Spherocytosis, Kidney Disease, Lung Disease, Seizures, Stroke, Tuberculosis. Social History Never smoker, Marital Status - Married, Alcohol Use - Moderate, Drug Use - Current History - Marijuana, Caffeine Use - Daily - Coffee. Medical History Respiratory Patient has history of Asthma Immunological Patient has history of Lupus Erythematosus Objective Constitutional respirations regular, non-labored and within target range for patient.. Vitals Time Taken: 2:24 PM, Height: 62 in, Temperature: 98.5 F, Pulse: 108 bpm, Respiratory Rate: 18 breaths/min, Blood Pressure: 136/89 mmHg. Cardiovascular 2+ dorsalis pedis/posterior tibialis pulses. Psychiatric pleasant and cooperative. General Notes: Left lower extremity: 2 wounds present. 1 to the  anterior aspect with calcium deposit that was easily removed. Granulation tissue to this wound bed. Another wound to the lateral ankle with granulation tissue and scant nonviable tissue. No signs of surrounding infection. T the right lower extremity there o is an open wound to the anterior aspect with nonviable tissue throughout. Surrounding skin is intact with no irritation. Again no surrounding signs of infection. Integumentary (Hair, Skin) Wound #4 status is Open. Original cause of wound was Gradually Appeared. The date acquired was: 04/14/2021. The wound has been in treatment 24 weeks. The wound is located on the Left,Lateral Ankle. The wound measures 1.4cm length x 1.4cm width x 0.1cm depth; 1.539cm^2 area and 0.154cm^3 volume.  There is Fat Layer (Subcutaneous Tissue) exposed. There is no tunneling or undermining noted. There is a medium amount of serosanguineous drainage noted. The wound margin is distinct with the outline attached to the wound base. There is large (67-100%) pink granulation within the wound bed. There is a small (1-33%) amount of necrotic tissue within the wound bed including Adherent Slough. Wound #8 status is Open. Original cause of wound was Gradually Appeared. The date acquired was: 08/28/2021. The wound has been in treatment 5 weeks. The wound is located on the Right,Anterior Lower Leg. The wound measures 1cm length x 0.4cm width x 0.2cm depth; 0.314cm^2 area and 0.063cm^3 volume. There is Fat Layer (Subcutaneous Tissue) exposed. There is no tunneling or undermining noted. There is a medium amount of serous drainage noted. The wound margin is flat and intact. There is no granulation within the wound bed. There is a large (67-100%) amount of necrotic tissue within the wound bed including Adherent Slough. Wound #9 status is Open. Original cause of wound was Gradually Appeared. The date acquired was: 10/09/2021. The wound is located on the Left,Anterior Lower Leg. The wound  measures 0.5cm length x 0.2cm width x 0.1cm depth; 0.079cm^2 area and 0.008cm^3 volume. There is Fat Layer (Subcutaneous Tissue) exposed. There is no tunneling or undermining noted. There is a medium amount of serosanguineous drainage noted. The wound margin is flat and intact. There is large (67-100%) red granulation within the wound bed. There is no necrotic tissue within the wound bed. Assessment Active Problems ICD-10 Non-pressure chronic ulcer of other part of left lower leg with fat layer exposed Non-pressure chronic ulcer of other part of right lower leg with fat layer exposed Systemic lupus erythematosus, unspecified Cellulitis of left lower limb Patient's left anterior wound has reopened. I was able to remove the calcium deposit preventing healing. There is granulation tissue present and I recommended collagen to this area. The other 2 wounds remaining appear well-healing. I debrided nonviable tissue. No signs of infection on exam. I recommended Medihoney to the right leg wound and lateral left ankle wound. Procedures Wound #9 Pre-procedure diagnosis of Wound #9 is a Lupus located on the Left,Anterior Lower Leg . There was a Selective/Open Wound Non-Viable Tissue Debridement with a total area of 0.1 sq cm performed by Geralyn Corwin, DO. With the following instrument(s): Curette to remove Non-Viable tissue/material. Material removed includes [facility] after achieving pain control using Other (Benzocaine 20%). No specimens were taken. A time out was conducted at 15:05, prior to the start of the procedure. A Minimum amount of bleeding was controlled with Pressure. The procedure was tolerated well with a pain level of 0 throughout and a pain level of 3 following the procedure. Post Debridement Measurements: 0.5cm length x 0.2cm width x 0.1cm depth; 0.008cm^3 volume. Character of Wound/Ulcer Post Debridement is improved. Post procedure Diagnosis Wound #9: Same as  Pre-Procedure Plan Follow-up Appointments: Return Appointment in 2 weeks. - Dr. Mikey Bussing Bathing/ Shower/ Hygiene: May shower and wash wound with soap and water. Edema Control - Lymphedema / SCD / Other: Elevate legs to the level of the heart or above for 30 minutes daily and/or when sitting, a frequency of: - 3-4 times a day throughout the day. Avoid standing for long periods of time. Additional Orders / Instructions: Follow Nutritious Diet WOUND #4: - Ankle Wound Laterality: Left, Lateral Cleanser: Soap and Water 1 x Per Day/30 Days Discharge Instructions: May shower and wash wound with dial antibacterial soap and water prior to dressing  change. Prim Dressing: MediHoney Gel, tube 1.5 (oz) 1 x Per Day/30 Days ary Discharge Instructions: Apply to wound bed as instructed Secondary Dressing: Woven Gauze Sponge, Non-Sterile 4x4 in (DME) (Generic) 1 x Per Day/30 Days Discharge Instructions: Apply over primary dressing as directed. Secured With: [employer] Group[employer]erlix Roll Sterile, 4.5x3.1 (in/yd) (Generic) 1 x Per Day/30 Days Discharge Instructions: Secure with Kerlix as directed. Secured With: Paper T ape, 2x10 (in/yd) (DME) (Generic) 1 x Per Day/30 Days Discharge Instructions: Secure dressing with tape as directed. WOUND #8: - Lower Leg Wound Laterality: Right, Anterior Peri-Wound Care: hydrocortisone cream 1 x Per Day/30 Days Discharge Instructions: apply to red, irritated periwound Prim Dressing: MediHoney Gel, tube 1.5 (oz) 1 x Per Day/30 Days ary Discharge Instructions: or Manuka honey Apply to wound bed as instructed Secondary Dressing: Woven Gauze Sponge, Non-Sterile 4x4 in 1 x Per Day/30 Days Discharge Instructions: Apply over primary dressing as directed. Secured With: Insurance underwriterConforming Stretch Gauze Bandage, Sterile 2x75 (in/in) 1 x Per Day/30 Days Discharge Instructions: Secure with stretch gauze as directed. WOUND #9: - Lower Leg Wound Laterality: Left, Anterior Cleanser: Soap and Water Discharge  Instructions: May shower and wash wound with dial antibacterial soap and water prior to dressing change. Cleanser: Wound Cleanser Discharge Instructions: Cleanse the wound with wound cleanser prior to applying a clean dressing using gauze sponges, not tissue or cotton balls. Prim Dressing: Promogran Prisma Matrix, 4.34 (sq in) (silver collagen) ary Discharge Instructions: Moisten collagen with saline or hydrogel Prim Dressing: MediHoney Gel, tube 1.5 (oz) ary Discharge Instructions: Apply to wound bed as instructed Secondary Dressing: Woven Gauze Sponge, Non-Sterile 4x4 in Discharge Instructions: Apply over primary dressing as directed. Secondary Dressing: Conforming Stretch Gauze, 2x4.1 (in/yd) Secured With: Paper T ape, 2x10 (in/yd) Discharge Instructions: Secure dressing with tape as directed. 1. In office sharp debridement 2. Medihoney 3. Collagen 4. Follow-up in 2 weeks Electronic Signature(s) Signed: 10/09/2021 3:25:49 PM By: Geralyn CorwinHoffman, Berlyn Malina DO Entered By: Geralyn CorwinHoffman, Rocko Fesperman on 10/09/2021 15:25:15 -------------------------------------------------------------------------------- HxROS Details Patient Name: Date of Service: Kelly MillingNEWKIRK, [name] 10/09/2021 2:15 PM Medical Record Number: [id number] Patient Account Number: [id number] Date of Birth/Sex: Treating RN: 1995/11/07 (25 y.o. Roel CluckF) Barnhart, Jodi Primary Care Provider: Dione BoozePartridge, James Other Clinician: Referring Provider: Treating Provider/Extender: Gennaro AfricaHoffman,  Grosser Partridge, James Weeks in Treatment: 34 Information Obtained From Patient Respiratory Medical History: Positive for: Asthma Immunological Medical History: Positive for: Lupus Erythematosus Immunizations Pneumococcal Vaccine: Received Pneumococcal Vaccination: No Implantable Devices None Family and Social History Cancer: No; Diabetes: Yes - Paternal Grandparents; Heart Disease: No; Hereditary Spherocytosis: No; Hypertension: Yes - Paternal Grandparents;  Kidney Disease: No; Lung Disease: No; Seizures: No; Stroke: No; Thyroid Problems: Yes - Maternal Grandparents,Paternal Grandparents; Tuberculosis: No; Never smoker; Marital Status - Married; Alcohol Use: Moderate; Drug Use: Current History - Marijuana; Caffeine Use: Daily - Coffee; Financial Concerns: No; Food, Clothing or Shelter Needs: No; Support System Lacking: No Electronic Signature(s) Signed: 10/09/2021 3:25:49 PM By: Geralyn CorwinHoffman, Lizeth Bencosme DO Signed: 10/09/2021 5:31:57 PM By: Antonieta IbaBarnhart, Jodi Entered By: Geralyn CorwinHoffman, Kamaria Lucia on 10/09/2021 15:21:56 -------------------------------------------------------------------------------- SuperBill Details Patient Name: Date of Service: Kelly MillingNEWKIRK, [name] 10/09/2021 Medical Record Number: [id number] Patient Account Number: [id number] Date of Birth/Sex: Treating RN: 1995/11/07 (25 y.o. Roel CluckF) Barnhart, Jodi Primary Care Provider: Dione BoozePartridge, James Other Clinician: Referring Provider: Treating Provider/Extender: Gennaro AfricaHoffman, Brion Sossamon Partridge, James Weeks in Treatment: 34 Diagnosis Coding ICD-10 Codes Code Description [id number] Non-pressure chronic ulcer of other part of left lower leg with fat layer exposed L97.812 Non-pressure chronic ulcer of other part of  right lower leg with fat layer exposed M32.9 Systemic lupus erythematosus, unspecified L03.116 Cellulitis of left lower limb Facility Procedures CPT4 Code: 44920100 Description: [phone] - DEBRIDE WOUND 1ST 20 SQ CM OR < ICD-10 Diagnosis Description L97.812 Non-pressure chronic ulcer of other part of right lower leg with fat layer expos Modifier: ed Quantity: 1 Physician Procedures : CPT4 Code Description Modifier [id number] - WC PHYS DEBR WO ANESTH 20 SQ CM ICD-10 Diagnosis Description L97.812 Non-pressure chronic ulcer of other part of right lower leg with fat layer exposed Quantity: 1 Electronic Signature(s) Signed: 10/09/2021 3:25:49 PM By: Geralyn Corwin DO Entered By: Geralyn Corwin on  10/09/2021 15:25:31

## 2021-10-23 ENCOUNTER — Other Ambulatory Visit: Payer: Self-pay

## 2021-10-23 ENCOUNTER — Encounter (HOSPITAL_BASED_OUTPATIENT_CLINIC_OR_DEPARTMENT_OTHER): Payer: BC Managed Care – PPO | Admitting: Internal Medicine

## 2021-10-23 DIAGNOSIS — L97822 Non-pressure chronic ulcer of other part of left lower leg with fat layer exposed: Secondary | ICD-10-CM | POA: Diagnosis not present

## 2021-10-23 DIAGNOSIS — L97812 Non-pressure chronic ulcer of other part of right lower leg with fat layer exposed: Secondary | ICD-10-CM

## 2021-10-23 DIAGNOSIS — M329 Systemic lupus erythematosus, unspecified: Secondary | ICD-10-CM | POA: Diagnosis not present

## 2021-10-23 NOTE — Progress Notes (Signed)
Kelly [name], Kelly [name] ([id number]) Visit Report for 10/23/2021 Arrival Information Details Patient Name: Date of Service: Kelly [name], Kelly [name] 10/23/2021 2:15 PM Medical Record Number: [id number] Patient Account Number: [id number] Date of Birth/Sex: Treating RN: 21-Jan-1996 (26 y.o. Kelly [name] Primary Care Rishav Rockefeller: [name] Other Clinician: Referring Keriann Rankin: Treating Julianny Milstein/Extender: Elsworth Soho in Treatment: 86 Visit Information History Since Last Visit Added or deleted any medications: No Patient Arrived: Ambulatory Any new allergies or adverse reactions: No Arrival Time: 15:14 Had a fall or experienced change in No Transfer Assistance: None activities of daily living that may affect Patient Identification Verified: Yes risk of falls: Secondary Verification Process Completed: Yes Signs or symptoms of abuse/neglect since last visito No Patient Requires Transmission-Based Precautions: No Hospitalized since last visit: No Patient Has Alerts: Yes Implantable device outside of the clinic excluding No Patient Alerts: Left ABi=1.18 cellular tissue based products placed in the center since last visit: Has Dressing in Place as Prescribed: Yes Pain Present Now: No Electronic Signature(s) Signed: 10/23/2021 3:49:30 PM By: Sharyn Creamer RN, BSN Entered By: Sharyn Creamer on 10/23/2021 15:14:59 -------------------------------------------------------------------------------- Clinic Level of Care Assessment Details Patient Name: Date of Service: [name], [name] [name] 10/23/2021 2:15 PM Medical Record Number: [id number] Patient Account Number: [id number] Date of Birth/Sex: Treating RN: Mar 10, 1996 (26 y.o. Kelly [name], [name] Primary Care Zackary Mckeone: [name] Other Clinician: Referring Siriyah Ambrosius: Treating Theodor Mustin/Extender: Elsworth Soho in Treatment: 36 Clinic Level of Care Assessment Items TOOL 4 Quantity  Score X- 1 0 Use when only an EandM is performed on FOLLOW-UP visit ASSESSMENTS - Nursing Assessment / Reassessment X- 1 10 Reassessment of Co-morbidities (includes updates in patient status) X- 1 5 Reassessment of Adherence to Treatment Plan ASSESSMENTS - Wound and Skin A ssessment / Reassessment []  - 0 Simple Wound Assessment / Reassessment - one wound X- 3 5 Complex Wound Assessment / Reassessment - multiple wounds X- 1 10 Dermatologic / Skin Assessment (not related to wound area) ASSESSMENTS - Focused Assessment X- 2 5 Circumferential Edema Measurements - multi extremities []  - 0 Nutritional Assessment / Counseling / Intervention []  - 0 Lower Extremity Assessment (monofilament, tuning fork, pulses) []  - 0 Peripheral Arterial Disease Assessment (using hand held doppler) ASSESSMENTS - Ostomy and/or Continence Assessment and Care []  - 0 Incontinence Assessment and Management []  - 0 Ostomy Care Assessment and Management (repouching, etc.) PROCESS - Coordination of Care []  - 0 Simple Patient / Family Education for ongoing care X- 1 20 Complex (extensive) Patient / Family Education for ongoing care X- 1 10 Staff obtains Programmer, systems, Records, T Results / Process Orders est []  - 0 Staff telephones HHA, Nursing Homes / Clarify orders / etc []  - 0 Routine Transfer to another Facility (non-emergent condition) []  - 0 Routine Hospital Admission (non-emergent condition) []  - 0 New Admissions / Biomedical engineer / Ordering NPWT Apligraf, etc. , []  - 0 Emergency Hospital Admission (emergent condition) []  - 0 Simple Discharge Coordination X- 1 15 Complex (extensive) Discharge Coordination PROCESS - Special Needs []  - 0 Pediatric / Minor Patient Management []  - 0 Isolation Patient Management []  - 0 Hearing / Language / Visual special needs []  - 0 Assessment of Community assistance (transportation, D/C planning, etc.) []  - 0 Additional assistance / Altered  mentation []  - 0 Support Surface(s) Assessment (bed, cushion, seat, etc.) INTERVENTIONS - Wound Cleansing / Measurement []  - 0 Simple Wound Cleansing - one wound X- 3 5 Complex Wound Cleansing - multiple wounds  X- 1 5 Wound Imaging (photographs - any number of wounds) $RemoveBe'[]'FqajkkARI$  - 0 Wound Tracing (instead of photographs) $RemoveBeforeD'[]'OiGsDyXWYpHOdO$  - 0 Simple Wound Measurement - one wound X- 3 5 Complex Wound Measurement - multiple wounds INTERVENTIONS - Wound Dressings X - Small Wound Dressing one or multiple wounds 3 10 $Re'[]'Grh$  - 0 Medium Wound Dressing one or multiple wounds $RemoveBeforeD'[]'fjHkObFcxmzBqH$  - 0 Large Wound Dressing one or multiple wounds $RemoveBeforeD'[]'nXuiVKbJcWjkpB$  - 0 Application of Medications - topical $RemoveB'[]'qUZfSOXi$  - 0 Application of Medications - injection INTERVENTIONS - Miscellaneous $RemoveBeforeD'[]'XvtTfSlvLWicUm$  - 0 External ear exam $Remove'[]'bpCVEqq$  - 0 Specimen Collection (cultures, biopsies, blood, body fluids, etc.) $RemoveBefor'[]'YVqKFYLvBhYX$  - 0 Specimen(s) / Culture(s) sent or taken to Lab for analysis $RemoveBefo'[]'tlWYpEMAHXr$  - 0 Patient Transfer (multiple staff / Civil Service fast streamer / Similar devices) $RemoveBeforeDE'[]'xcRKPcFSqinCyFv$  - 0 Simple Staple / Suture removal (25 or less) $Remove'[]'aLXikix$  - 0 Complex Staple / Suture removal (26 or more) $Remove'[]'iLfFFbk$  - 0 Hypo / Hyperglycemic Management (close monitor of Blood Glucose) $RemoveBefore'[]'rgvKejfGcMrZI$  - 0 Ankle / Brachial Index (ABI) - do not check if billed separately X- 1 5 Vital Signs Has the patient been seen at the hospital within the last three years: Yes Total Score: 165 Level Of Care: New/Established - Level 5 Electronic Signature(s) Signed: 10/23/2021 4:45:57 PM By: Deon Pilling RN, BSN Entered By: Deon Pilling on 10/23/2021 15:20:07 -------------------------------------------------------------------------------- Encounter Discharge Information Details Patient Name: Date of Service: Kelly Lou [name] 10/23/2021 2:15 PM Medical Record Number: [id number] Patient Account Number: [id number] Date of Birth/Sex: Treating RN: 07-08-1996 (26 y.o. Kelly [name], Tammi Klippel Primary Care Flossie Wexler: [name] Other Clinician: Referring  August Gosser: Treating Clarice Zulauf/Extender: Elsworth Soho in Treatment: 36 Encounter Discharge Information Items Discharge Condition: Stable Ambulatory Status: Ambulatory Discharge Destination: Home Transportation: Private Auto Accompanied By: self Schedule Follow-up Appointment: Yes Clinical Summary of Care: Electronic Signature(s) Signed: 10/23/2021 4:45:57 PM By: Deon Pilling RN, BSN Entered By: Deon Pilling on 10/23/2021 15:20:48 -------------------------------------------------------------------------------- Lower Extremity Assessment Details Patient Name: Date of Service: Kelly Lou RIA Cipriano Bunker. 10/23/2021 2:15 PM Medical Record Number: [id number] Patient Account Number: [id number] Date of Birth/Sex: Treating RN: Feb 24, 1996 (26 y.o. Kelly [name] Primary Care Christian Treadway: [name] Other Clinician: Referring Leathie Weich: Treating Betsie Peckman/Extender: Elsworth Soho in Treatment: 36 Edema Assessment Assessed: Shirlyn Goltz: No] Patrice Paradise: No] Edema: [Left: No] [Right: Yes] Calf Left: Right: Point of Measurement: 27 cm From Medial Instep 33 cm 31 cm Ankle Left: Right: Point of Measurement: 8 cm From Medial Instep 21.4 cm 21.6 cm Vascular Assessment Pulses: Dorsalis Pedis Palpable: [Left:Yes] [Right:Yes] Electronic Signature(s) Signed: 10/23/2021 3:49:30 PM By: Sharyn Creamer RN, BSN Entered By: Sharyn Creamer on 10/23/2021 15:16:22 -------------------------------------------------------------------------------- Multi Wound Chart Details Patient Name: Date of Service: Kelly Lou [name] 10/23/2021 2:15 PM Medical Record Number: [id number] Patient Account Number: [id number] Date of Birth/Sex: Treating RN: Feb 05, 1996 (26 y.o. F) Primary Care Skyann Ganim: [name] Other Clinician: Referring Cherlyn Syring: Treating Bryndan Bilyk/Extender: Elsworth Soho in Treatment: 36 Vital Signs Height(in):  62 Pulse(bpm): 45 Weight(lbs): Blood Pressure(mmHg): 116/79 Body Mass Index(BMI): Temperature(F): 98.5 Respiratory Rate(breaths/min): 18 Photos: Left, Lateral Ankle Right, Anterior Lower Leg Left, Anterior Lower Leg Wound Location: Gradually Appeared Gradually Appeared Gradually Appeared Wounding Event: Cellulitis Lupus Lupus Primary Etiology: Lupus N/A N/A Secondary Etiology: Asthma, Lupus Erythematosus Asthma, Lupus Erythematosus Asthma, Lupus Erythematosus Comorbid History: 04/14/2021 08/28/2021 10/09/2021 Date Acquired: $RemoveBefore'26 7 2 'MGGXnbzwpMLWQ$ Weeks of Treatment: Open Open Open Wound Status: No No No Wound Recurrence: 1.1x1x0.1 0.8x0.2x0.1 0.4x0.4x0.1 Measurements L x W  x D (cm) 0.864 0.126 0.126 A (cm) : rea 0.086 0.013 0.013 Volume (cm) : 94.50% 75.30% -59.50% % Reduction in A rea: 98.60% 74.50% -62.50% % Reduction in Volume: 12 12 Starting Position 1 (o'clock): 12 12 Ending Position 1 (o'clock): 0.5 0.2 Maximum Distance 1 (cm): No Yes Yes Undermining: Full Thickness Without Exposed Full Thickness Without Exposed Full Thickness Without Exposed Classification: Support Structures Support Structures Support Structures Medium Medium Medium Exudate Amount: Serosanguineous Serous Serosanguineous Exudate Type: red, brown amber red, brown Exudate Color: Distinct, outline attached Flat and Intact Flat and Intact Wound Margin: Large (67-100%) None Present (0%) None Present (0%) Granulation Amount: Pink N/A N/A Granulation Quality: Small (1-33%) Large (67-100%) Large (67-100%) Necrotic Amount: Fat Layer (Subcutaneous Tissue): Yes Fat Layer (Subcutaneous Tissue): Yes Fat Layer (Subcutaneous Tissue): Yes Exposed Structures: Fascia: No Fascia: No Fascia: No Tendon: No Tendon: No Tendon: No Muscle: No Muscle: No Muscle: No Joint: No Joint: No Joint: No Bone: No Bone: No Bone: No Small (1-33%) None None Epithelialization: Treatment Notes Wound #4 (Ankle) Wound  Laterality: Left, Lateral Cleanser Soap and Water Discharge Instruction: May shower and wash wound with dial antibacterial soap and water prior to dressing change. Peri-Wound Care Topical Primary Dressing MediHoney Gel, tube 1.5 (oz) Discharge Instruction: Apply to wound bed as instructed Secondary Dressing Woven Gauze Sponge, Non-Sterile 4x4 in Discharge Instruction: Apply over primary dressing as directed. Secured With [employer], 4.5x3.1 (in/yd) Discharge Instruction: Secure with Kerlix as directed. Paper Tape, 2x10 (in/yd) Discharge Instruction: Secure dressing with tape as directed. Compression Wrap Compression Stockings Add-Ons Wound #8 (Lower Leg) Wound Laterality: Right, Anterior Cleanser Peri-Wound Care hydrocortisone cream Discharge Instruction: apply to red, irritated periwound Topical Primary Dressing MediHoney Gel, tube 1.5 (oz) Discharge Instruction: or Manuka honey Apply to wound bed as instructed Secondary Dressing Woven Gauze Sponge, Non-Sterile 4x4 in Discharge Instruction: Apply over primary dressing as directed. Secured With Conforming Stretch Gauze Bandage, Sterile 2x75 (in/in) Discharge Instruction: Secure with stretch gauze as directed. Compression Wrap Compression Stockings Add-Ons Wound #9 (Lower Leg) Wound Laterality: Left, Anterior Cleanser Soap and Water Discharge Instruction: May shower and wash wound with dial antibacterial soap and water prior to dressing change. Wound Cleanser Discharge Instruction: Cleanse the wound with wound cleanser prior to applying a clean dressing using gauze sponges, not tissue or cotton balls. Peri-Wound Care Topical Primary Dressing Promogran Prisma Matrix, 4.34 (sq in) (silver collagen) Discharge Instruction: Moisten collagen with saline or hydrogel MediHoney Gel, tube 1.5 (oz) Discharge Instruction: Apply to wound bed as instructed Secondary Dressing Woven Gauze Sponge, Non-Sterile 4x4  in Discharge Instruction: Apply over primary dressing as directed. Conforming Stretch Gauze, 2x4.1 (in/yd) Secured With Paper Tape, 2x10 (in/yd) Discharge Instruction: Secure dressing with tape as directed. Compression Wrap Compression Stockings Add-Ons Electronic Signature(s) Signed: 10/23/2021 3:30:07 PM By: Kalman Shan DO Entered By: Kalman Shan on 10/23/2021 15:21:42 -------------------------------------------------------------------------------- Multi-Disciplinary Care Plan Details Patient Name: Date of Service: Kelly Lou [name] 10/23/2021 2:15 PM Medical Record Number: [id number] Patient Account Number: [id number] Date of Birth/Sex: Treating RN: Jan 03, 1996 (26 y.o. Debby Bud Primary Care Tammie Yanda: [name] Other Clinician: Referring Demetrie Borge: Treating Cathryne Mancebo/Extender: Elsworth Soho in Treatment: [place] reviewed with physician Active Inactive Wound/Skin Impairment Nursing Diagnoses: Impaired tissue integrity Goals: Patient/caregiver will verbalize understanding of skin care regimen Date Initiated: 02/13/2021 Target Resolution Date: 11/24/2021 Goal Status: Active Ulcer/skin breakdown will have a volume reduction of 30% by week 4 Date Initiated: 02/13/2021 Date Inactivated:  04/21/2021 Target Resolution Date: 03/13/2021 Unmet Reason: pt did not return to Goal Status: Unmet clinic for F/U Ulcer/skin breakdown will have a volume reduction of 80% by week 12 Date Initiated: 04/21/2021 Date Inactivated: 07/13/2021 Target Resolution Date: 07/27/2021 Goal Status: Met Interventions: Assess patient/caregiver ability to obtain necessary supplies Assess patient/caregiver ability to perform ulcer/skin care regimen upon admission and as needed Assess ulceration(s) every visit Provide education on ulcer and skin care Treatment Activities: Skin care regimen initiated : 02/13/2021 Topical wound management  initiated : 02/13/2021 Notes: 06/29/21: Wounds 3 and 6 only at 80% volume reduction. Target date extended. Electronic Signature(s) Signed: 10/23/2021 4:45:57 PM By: Deon Pilling RN, BSN Entered By: Deon Pilling on 10/23/2021 15:18:27 -------------------------------------------------------------------------------- Pain Assessment Details Patient Name: Date of Service: Kelly Lou RIA Cipriano Bunker. 10/23/2021 2:15 PM Medical Record Number: [id number] Patient Account Number: [id number] Date of Birth/Sex: Treating RN: Sep 07, 1995 (26 y.o. Kelly [name] Primary Care Fran Mcree: [name] Other Clinician: Referring Ahaana Rochette: Treating Lailyn Appelbaum/Extender: Elsworth Soho in Treatment: 36 Active Problems Location of Pain Severity and Description of Pain Patient Has Paino No Site Locations Pain Management and Medication Current Pain Management: Electronic Signature(s) Signed: 10/23/2021 3:49:30 PM By: Sharyn Creamer RN, BSN Entered By: Sharyn Creamer on 10/23/2021 15:15:12 -------------------------------------------------------------------------------- Patient/Caregiver Education Details Patient Name: Date of Service: Kelly [name] 2/27/2023andnbsp2:15 PM Medical Record Number: [id number] Patient Account Number: [id number] Date of Birth/Gender: Treating RN: 1995/11/27 (26 y.o. Debby Bud Primary Care Physician: [name] Other Clinician: Referring Physician: Treating Physician/Extender: Elsworth Soho in Treatment: 36 Education Assessment Education Provided To: Patient Education Topics Provided Wound/Skin Impairment: Handouts: Skin Care Do's and Dont's Methods: Explain/Verbal Responses: Reinforcements needed Electronic Signature(s) Signed: 10/23/2021 4:45:57 PM By: Deon Pilling RN, BSN Entered By: Deon Pilling on 10/23/2021  15:19:25 -------------------------------------------------------------------------------- Wound Assessment Details Patient Name: Date of Service: Kelly Lou RIA Cipriano Bunker. 10/23/2021 2:15 PM Medical Record Number: [id number] Patient Account Number: [id number] Date of Birth/Sex: Treating RN: 1996/06/05 (26 y.o. Kelly [name] Primary Care Aaminah Forrester: [name] Other Clinician: Referring Lloyd Cullinan: Treating Clorene Nerio/Extender: Elsworth Soho in Treatment: 36 Wound Status Wound Number: 4 Primary Etiology: Cellulitis Wound Location: Left, Lateral Ankle Secondary Etiology: Lupus Wounding Event: Gradually Appeared Wound Status: Open Date Acquired: 04/14/2021 Comorbid History: Asthma, Lupus Erythematosus Weeks Of Treatment: 26 Clustered Wound: No Photos Wound Measurements Length: (cm) 1.1 Width: (cm) 1 Depth: (cm) 0.1 Area: (cm) 0.864 Volume: (cm) 0.086 % Reduction in Area: 94.5% % Reduction in Volume: 98.6% Epithelialization: Small (1-33%) Tunneling: No Undermining: No Wound Description Classification: Full Thickness Without Exposed Support Structures Wound Margin: Distinct, outline attached Exudate Amount: Medium Exudate Type: Serosanguineous Exudate Color: red, brown Foul Odor After Cleansing: No Slough/Fibrino Yes Wound Bed Granulation Amount: Large (67-100%) Exposed Structure Granulation Quality: Pink Fascia Exposed: No Necrotic Amount: Small (1-33%) Fat Layer (Subcutaneous Tissue) Exposed: Yes Necrotic Quality: Adherent Slough Tendon Exposed: No Muscle Exposed: No Joint Exposed: No Bone Exposed: No Treatment Notes Wound #4 (Ankle) Wound Laterality: Left, Lateral Cleanser Soap and Water Discharge Instruction: May shower and wash wound with dial antibacterial soap and water prior to dressing change. Peri-Wound Care Topical Primary Dressing MediHoney Gel, tube 1.5 (oz) Discharge Instruction: Apply to wound bed as  instructed Secondary Dressing Woven Gauze Sponge, Non-Sterile 4x4 in Discharge Instruction: Apply over primary dressing as directed. Secured With [employer], 4.5x3.1 (in/yd) Discharge Instruction: Secure with Kerlix as directed. Paper Tape, 2x10 (in/yd) Discharge Instruction: Secure dressing with  tape as directed. Compression Wrap Compression Stockings Add-Ons Electronic Signature(s) Signed: 10/23/2021 3:49:30 PM By: Sharyn Creamer RN, BSN Entered By: Sharyn Creamer on 10/23/2021 15:18:03 -------------------------------------------------------------------------------- Wound Assessment Details Patient Name: Date of Service: Kelly Lou RIA Cipriano Bunker. 10/23/2021 2:15 PM Medical Record Number: [id number] Patient Account Number: [id number] Date of Birth/Sex: Treating RN: 1996/08/09 (26 y.o. Kelly [name] Primary Care Emberly Tomasso: [name] Other Clinician: Referring Asaf Elmquist: Treating Chayah Mckee/Extender: Elsworth Soho in Treatment: 36 Wound Status Wound Number: 8 Primary Etiology: Lupus Wound Location: Right, Anterior Lower Leg Wound Status: Open Wounding Event: Gradually Appeared Comorbid History: Asthma, Lupus Erythematosus Date Acquired: 08/28/2021 Weeks Of Treatment: 7 Clustered Wound: No Photos Wound Measurements Length: (cm) 0.8 Width: (cm) 0.2 Depth: (cm) 0.1 Area: (cm) 0.126 Volume: (cm) 0.013 % Reduction in Area: 75.3% % Reduction in Volume: 74.5% Epithelialization: None Tunneling: No Undermining: Yes Starting Position (o'clock): 12 Ending Position (o'clock): 12 Maximum Distance: (cm) 0.5 Wound Description Classification: Full Thickness Without Exposed Support Structu Wound Margin: Flat and Intact Exudate Amount: Medium Exudate Type: Serous Exudate Color: amber res Foul Odor After Cleansing: No Slough/Fibrino Yes Wound Bed Granulation Amount: None Present (0%) Exposed Structure Necrotic Amount: Large  (67-100%) Fascia Exposed: No Necrotic Quality: Adherent Slough Fat Layer (Subcutaneous Tissue) Exposed: Yes Tendon Exposed: No Muscle Exposed: No Joint Exposed: No Bone Exposed: No Treatment Notes Wound #8 (Lower Leg) Wound Laterality: Right, Anterior Cleanser Peri-Wound Care hydrocortisone cream Discharge Instruction: apply to red, irritated periwound Topical Primary Dressing MediHoney Gel, tube 1.5 (oz) Discharge Instruction: or Manuka honey Apply to wound bed as instructed Secondary Dressing Woven Gauze Sponge, Non-Sterile 4x4 in Discharge Instruction: Apply over primary dressing as directed. Secured With Conforming Stretch Gauze Bandage, Sterile 2x75 (in/in) Discharge Instruction: Secure with stretch gauze as directed. Compression Wrap Compression Stockings Add-Ons Electronic Signature(s) Signed: 10/23/2021 3:49:30 PM By: Sharyn Creamer RN, BSN Signed: 10/23/2021 4:45:57 PM By: Deon Pilling RN, BSN Entered By: Deon Pilling on 10/23/2021 15:19:02 -------------------------------------------------------------------------------- Wound Assessment Details Patient Name: Date of Service: Kelly Lou RIA Cipriano Bunker. 10/23/2021 2:15 PM Medical Record Number: [id number] Patient Account Number: [id number] Date of Birth/Sex: Treating RN: 1995/11/18 (26 y.o. Kelly [name] Primary Care Bastien Strawser: [name] Other Clinician: Referring Tamora Huneke: Treating Arlicia Paquette/Extender: Elsworth Soho in Treatment: 36 Wound Status Wound Number: 9 Primary Etiology: Lupus Wound Location: Left, Anterior Lower Leg Wound Status: Open Wounding Event: Gradually Appeared Comorbid History: Asthma, Lupus Erythematosus Date Acquired: 10/09/2021 Weeks Of Treatment: 2 Clustered Wound: No Photos Wound Measurements Length: (cm) 0.4 Width: (cm) 0.4 Depth: (cm) 0.1 Area: (cm) 0.126 Volume: (cm) 0.013 % Reduction in Area: -59.5% % Reduction in Volume:  -62.5% Epithelialization: None Tunneling: No Undermining: Yes Starting Position (o'clock): 12 Ending Position (o'clock): 12 Maximum Distance: (cm) 0.2 Wound Description Classification: Full Thickness Without Exposed Support Structures Wound Margin: Flat and Intact Exudate Amount: Medium Exudate Type: Serosanguineous Exudate Color: red, brown Foul Odor After Cleansing: No Slough/Fibrino Yes Wound Bed Granulation Amount: None Present (0%) Exposed Structure Necrotic Amount: Large (67-100%) Fascia Exposed: No Necrotic Quality: Adherent Slough Fat Layer (Subcutaneous Tissue) Exposed: Yes Tendon Exposed: No Muscle Exposed: No Joint Exposed: No Bone Exposed: No Treatment Notes Wound #9 (Lower Leg) Wound Laterality: Left, Anterior Cleanser Soap and Water Discharge Instruction: May shower and wash wound with dial antibacterial soap and water prior to dressing change. Wound Cleanser Discharge Instruction: Cleanse the wound with wound cleanser prior to applying a clean dressing using gauze sponges, not tissue or cotton  balls. Peri-Wound Care Topical Primary Dressing Promogran Prisma Matrix, 4.34 (sq in) (silver collagen) Discharge Instruction: Moisten collagen with saline or hydrogel MediHoney Gel, tube 1.5 (oz) Discharge Instruction: Apply to wound bed as instructed Secondary Dressing Woven Gauze Sponge, Non-Sterile 4x4 in Discharge Instruction: Apply over primary dressing as directed. Conforming Stretch Gauze, 2x4.1 (in/yd) Secured With Paper Tape, 2x10 (in/yd) Discharge Instruction: Secure dressing with tape as directed. Compression Wrap Compression Stockings Add-Ons Electronic Signature(s) Signed: 10/23/2021 3:49:30 PM By: Sharyn Creamer RN, BSN Entered By: Sharyn Creamer on 10/23/2021 15:17:34 -------------------------------------------------------------------------------- Vitals Details Patient Name: Date of Service: Kelly Lou [name] 10/23/2021 2:15 PM Medical  Record Number: [id number] Patient Account Number: [id number] Date of Birth/Sex: Treating RN: [birth year] (26 y.o. Kelly [name] Primary Care Daejah Klebba: [name] Other Clinician: Referring Abreanna Drawdy: Treating Derold Dorsch/Extender: Elsworth Soho in Treatment: 36 Vital Signs Time Taken: 15:02 Temperature (F): 98.5 Height (in): 62 Pulse (bpm): 87 Respiratory Rate (breaths/min): 18 Blood Pressure (mmHg): 116/79 Reference Range: 80 - 120 mg / dl Electronic Signature(s) Signed: 10/23/2021 3:49:30 PM By: Sharyn Creamer RN, BSN Entered By: Sharyn Creamer on 10/23/2021 15:15:05

## 2021-10-23 NOTE — Progress Notes (Signed)
MISSIE, DETTY ([id number]) Visit Report for 10/23/2021 Chief Complaint Document Details Patient Name: Date of Service: Kelly [name], Kelly [name] 10/23/2021 2:15 PM Medical Record Number: [id number] Patient Account Number: [id number] Date of Birth/Sex: Treating RN: Oct 16, 1995 (25 y.o. F) Primary Care Provider: Dione Booze Other Clinician: Referring Provider: Treating Provider/Extender: Gennaro Africa in Treatment: 36 Information Obtained from: Patient Chief Complaint Bilateral lower extremity wounds 04/21/21: left lower extremity wound s/p cellulitis 09/01/21; right lower extremity wound Electronic Signature(s) Signed: 10/23/2021 3:30:07 PM By: Geralyn Corwin DO Entered By: Geralyn Corwin on 10/23/2021 15:22:22 -------------------------------------------------------------------------------- HPI Details Patient Name: Date of Service: Kelly Milling [name] 10/23/2021 2:15 PM Medical Record Number: [id number] Patient Account Number: [id number] Date of Birth/Sex: Treating RN: 1995-10-07 (25 y.o. F) Primary Care Provider: Dione Booze Other Clinician: Referring Provider: Treating Provider/Extender: Gennaro Africa in Treatment: 36 History of Present Illness HPI Description: Admission 6/21 Ms. Aneyah Molis is A 26 year old female with a past medical history of systemic lupus erythematosus that presents with bilateral lower extremity wounds that started in April 2022. She has had skin issues in the past where she developed very small wounds but these healed with time. She has never had open wounds like she does on her legs. She currently reports minimal drainage to the wounds. She does have pain to these areas but has been overall stable. She denies signs of infection. She has visited the ED for this issue and was recently prescribed doxycycline for possible cellulitis. She follows with [facility] for her SLE and is  currently on Plaquenil, prednisone and CellCept. 6/27; patient presents for 1 week follow-up. She states she saw her rheumatologist on 6/22 and prednisone was increased from 10 mg to 20 mg daily. She is currently at the highest dose of Plaquenil and CellCept and is going to try an infusion later today at her regimen. She reports improvement to the wounds. She has been using collagen with dressing changes. She currently denies signs of infection. Readmission 8/26 Patient presents to clinic today for open wounds to her left lower extremity. Her previous wounds that were treated have closed. She states that on 8/9 she developed cellulitis and was treated with 2 rounds of antibiotics. Her cellulitis has resolved. She had blisters that opened and are now large wounds. She has seen her dermatologist and rheumatologist since she has systemic lupus and a hard time healing. No changes have been made to her medications. She was recommended antibiotic ointment by her dermatologist to her wounds She picked up the antibiotic ointment today.. She reports pain to these areas. She denies purulent drainage, increased warmth or erythema to the left lower extremity. She currently keeps the areas covered. Of note she had an Achilles tendon rupture on her right leg and is currently in a soft cast. 9/13; patient presents for follow-up. She missed her last clinic appointment. She has been using Santyl to the wound beds daily. She has no issues or complaints today. She reports the Santyl is helping clean up the wounds. She currently denies signs of infection. 9/22; patient states that 1 week ago she was seen by dermatology and they placed an Unna boot with Bactroban on her left lower extremity. Today she presents with a Unna boot in place. She currently denies signs of infection. 10/7; patient presents for follow-up. She reports improvement. She has been using Santyl to the areas of nonviable tissue and collagen to  granulation tissue. She has been approved for  infusions for her lupus. She is going to start these soon. These will be injection she can do at home. She currently denies signs of infection. 10/21; patient presents for follow-up. She reports continued improvement to her wound healing. She has been using Santyl to all areas except for the superior posterior left leg wound and she is using collagen to this. She has started her injection therapy for lupus. She started 1 week ago. She currently denies signs of infection. 11/3; patient presents for follow-up. She has no issues or complaints today. She is on her third week of injection therapy for lupus and tolerating this well. She has no issues or complaints today. She denies signs of infection. 11/17; patient presents for 2-week follow-up. She reports improvement in wound healing. She denies signs of infection. 12/1; patient presents for follow-up. She has been using collagen to the anterior left lower extremity wounds and Santyl to the left lateral malleolus. She has no issues or complaints today. 12/15; patient presents for follow-up. She is using collagen to the 2 anterior left leg wounds and Santyl to the left lateral malleolus wound. She reports improvement in wound healing. She denies signs of infection. 12/22; patient presents because she is having purulent drainage from her anterior left leg wound. She reports no inciting event and states that the area started draining spontaneously. She was hoping that the drainage would stop however this has not improved. She states this started 5 days ago. She reports tenderness to the wound bed. 12/29; patient presents for follow-up. She was able to take Augmentin for 4 days but did not tolerate the GI side effects. She continues to take doxycycline. She reports improvement in drainage. She no longer has increased warmth to the surrounding wound bed. She ran out of [place]Santyl and has been using Vaseline to the  wound beds. 1/6; Patient presents for follow-up. She has been taking Keflex without issues. She reports improvement in drainage and wound healing T the left lower o extremity. Unfortunately she developed another wound spontaneously to the right anterior lower extremity. She denies systemic signs of infection. 1/16; patient presents for follow-up. Patient completed her first prescription of Keflex and has not started the second. She reports no more drainage to the wound beds. She states there was an issue with insurance however she is going to be able to obtain Santyl soon. She has been using Dakin's wet-to-dry dressings to the right anterior leg and left lateral ankle. She has been using collagen to the distal anterior left leg wound and reports that the proximal anterior left leg wound has healed. She denies systemic signs of infection. 1/31; patient presents for follow-up. She reports improvement to the left leg wounds. She reports increased inflammation to the right leg wound. She thinks she is having a reaction to the foam border dressing on the right leg. She is unable to obtain Santyl. She has been using Dakin's wet-to-dry dressings to the right anterior leg and left lateral ankle. She has been using collagen to the distal anterior left leg wound. 2/13; patient presents for follow-up. She reports improvement in her wound healing. One of the wounds to the anterior aspect of the left lower leg has slightly reopened. She denies signs of infection. 2/27; patient presents for follow-up. She has no issues or complaints today. She denies signs of infection. Electronic Signature(s) Signed: 10/23/2021 3:30:07 PM By: Geralyn CorwinHoffman, Kenzi Bardwell DO Entered By: Geralyn CorwinHoffman, Parminder Cupples on 10/23/2021 15:23:03 -------------------------------------------------------------------------------- Physical Exam Details Patient Name: Date of Service: Kelly [name],  [name] 10/23/2021 2:15 PM Medical Record Number: [id number] Patient  Account Number: [id number] Date of Birth/Sex: Treating RN: 1996-02-22 (26 y.o. F) Primary Care Provider: Dione Booze Other Clinician: Referring Provider: Treating Provider/Extender: Gennaro Africa in Treatment: 36 Constitutional respirations regular, non-labored and within target range for patient.. Cardiovascular 2+ dorsalis pedis/posterior tibialis pulses. Psychiatric pleasant and cooperative. Notes Left lower extremity: 2 wounds present. 1 to the anterior aspect with granulation tissue present. The other to the lateral ankle with granulation tissue present as well. No surrounding signs of infection. T the right lower extremity there is an open wound with nonviable tissue throughout. No drainage noted. No signs of surrounding infection. o Electronic Signature(s) Signed: 10/23/2021 3:30:07 PM By: Geralyn Corwin DO Entered By: Geralyn Corwin on 10/23/2021 15:24:14 -------------------------------------------------------------------------------- Physician Orders Details Patient Name: Date of Service: Kelly Milling RIA Cherly Anderson. 10/23/2021 2:15 PM Medical Record Number: [id number] Patient Account Number: [id number] Date of Birth/Sex: Treating RN: 1995-10-12 (25 y.o. Debara Pickett, Millard.Loa Primary Care Provider: Dione Booze Other Clinician: Referring Provider: Treating Provider/Extender: Gennaro Africa in Treatment: 36 Verbal / Phone Orders: No Diagnosis Coding ICD-10 Coding Code Description [phone] Non-pressure chronic ulcer of other part of left lower leg with fat layer exposed L97.812 Non-pressure chronic ulcer of other part of right lower leg with fat layer exposed M32.9 Systemic lupus erythematosus, unspecified L03.116 Cellulitis of left lower limb Follow-up Appointments ppointment in 2 weeks. - Dr. Mikey Bussing and Yvonne Kendall, Room 8 Return A Bathing/ Shower/ Hygiene May shower and wash wound with soap and water. Edema Control -  Lymphedema / SCD / Other Elevate legs to the level of the heart or above for 30 minutes daily and/or when sitting, a frequency of: - 3-4 times a day throughout the day. Avoid standing for long periods of time. Additional Orders / Instructions Follow Nutritious Diet Wound Treatment Wound #4 - Ankle Wound Laterality: Left, Lateral Cleanser: Soap and Water 1 x Per Day/30 Days Discharge Instructions: May shower and wash wound with dial antibacterial soap and water prior to dressing change. Prim Dressing: MediHoney Gel, tube 1.5 (oz) 1 x Per Day/30 Days ary Discharge Instructions: Apply to wound bed as instructed Secondary Dressing: Woven Gauze Sponge, Non-Sterile 4x4 in (Generic) 1 x Per Day/30 Days Discharge Instructions: Apply over primary dressing as directed. Secured With: [employer], 4.5x3.1 (in/yd) (Generic) 1 x Per Day/30 Days Discharge Instructions: Secure with Kerlix as directed. Secured With: Paper Tape, 2x10 (in/yd) (Generic) 1 x Per Day/30 Days Discharge Instructions: Secure dressing with tape as directed. Wound #8 - Lower Leg Wound Laterality: Right, Anterior Peri-Wound Care: hydrocortisone cream 1 x Per Day/30 Days Discharge Instructions: apply to red, irritated periwound Prim Dressing: MediHoney Gel, tube 1.5 (oz) 1 x Per Day/30 Days ary Discharge Instructions: or Manuka honey Apply to wound bed as instructed Secondary Dressing: Woven Gauze Sponge, Non-Sterile 4x4 in 1 x Per Day/30 Days Discharge Instructions: Apply over primary dressing as directed. Secured With: Insurance underwriter, Sterile 2x75 (in/in) 1 x Per Day/30 Days Discharge Instructions: Secure with stretch gauze as directed. Wound #9 - Lower Leg Wound Laterality: Left, Anterior Cleanser: Soap and Water Discharge Instructions: May shower and wash wound with dial antibacterial soap and water prior to dressing change. Cleanser: Wound Cleanser Discharge Instructions: Cleanse the wound with  wound cleanser prior to applying a clean dressing using gauze sponges, not tissue or cotton balls. Prim Dressing: Promogran Prisma Matrix, 4.34 (sq in) (silver collagen) ary  Discharge Instructions: Moisten collagen with saline or hydrogel Prim Dressing: MediHoney Gel, tube 1.5 (oz) ary Discharge Instructions: Apply to wound bed as instructed Secondary Dressing: Woven Gauze Sponge, Non-Sterile 4x4 in Discharge Instructions: Apply over primary dressing as directed. Secondary Dressing: Conforming Stretch Gauze, 2x4.1 (in/yd) Secured With: Paper Tape, 2x10 (in/yd) Discharge Instructions: Secure dressing with tape as directed. Electronic Signature(s) Signed: 10/23/2021 3:30:07 PM By: Geralyn Corwin DO Entered By: Geralyn Corwin on 10/23/2021 15:25:30 -------------------------------------------------------------------------------- Problem List Details Patient Name: Date of Service: Kelly Milling RIA Cherly Anderson. 10/23/2021 2:15 PM Medical Record Number: [id number] Patient Account Number: [id number] Date of Birth/Sex: Treating RN: 1996-01-25 (25 y.o. Debara Pickett, Millard.Loa Primary Care Provider: Dione Booze Other Clinician: Referring Provider: Treating Provider/Extender: Gennaro Africa in Treatment: 36 Active Problems ICD-10 Encounter Code Description Active Date MDM Diagnosis [phone] Non-pressure chronic ulcer of other part of left lower leg with fat layer exposed11/10/2020 No Yes L97.812 Non-pressure chronic ulcer of other part of right lower leg with fat layer 06/29/2021 No Yes exposed M32.9 Systemic lupus erythematosus, unspecified 02/14/2021 No Yes L03.116 Cellulitis of left lower limb 08/24/2021 No Yes Inactive Problems ICD-10 Code Description Active Date Inactive Date L97.819 Non-pressure chronic ulcer of other part of right lower leg with unspecified severity 02/13/2021 02/13/2021 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 with  unspecified severity 02/13/2021 02/13/2021 I14.431V Unspecified open wound, left lower leg, initial encounter 04/21/2021 04/21/2021 S91.002A Unspecified open wound, left ankle, initial encounter 04/21/2021 04/21/2021 Resolved Problems Electronic Signature(s) Signed: 10/23/2021 3:30:07 PM By: Geralyn Corwin DO Entered By: Geralyn Corwin on 10/23/2021 15:21:17 -------------------------------------------------------------------------------- Progress Note Details Patient Name: Date of Service: Kelly Milling RIA Cherly Anderson. 10/23/2021 2:15 PM Medical Record Number: [id number] Patient Account Number: [id number] Date of Birth/Sex: Treating RN: [birth year] (25 y.o. F) Primary Care Provider: Dione Booze Other Clinician: Referring Provider: Treating Provider/Extender: Gennaro Africa in Treatment: 36 Subjective Chief Complaint Information obtained from Patient Bilateral lower extremity wounds 04/21/21: left lower extremity wound s/p cellulitis 09/01/21; right lower extremity wound History of Present Illness (HPI) Admission 6/21 Ms. Brecken Walth is A 26 year old female with a past medical history of systemic lupus erythematosus that presents with bilateral lower extremity wounds that started in April 2022. She has had skin issues in the past where she developed very small wounds but these healed with time. She has never had open wounds like she does on her legs. She currently reports minimal drainage to the wounds. She does have pain to these areas but has been overall stable. She denies signs of infection. She has visited the ED for this issue and was recently prescribed doxycycline for possible cellulitis. She follows with [facility] for her SLE and is currently on Plaquenil, prednisone and CellCept. 6/27; patient presents for 1 week follow-up. She states she saw her rheumatologist on 6/22 and prednisone was increased from 10 mg to 20 mg daily. She is currently at the  highest dose of Plaquenil and CellCept and is going to try an infusion later today at her regimen. She reports improvement to the wounds. She has been using collagen with dressing changes. She currently denies signs of infection. Readmission 8/26 Patient presents to clinic today for open wounds to her left lower extremity. Her previous wounds that were treated have closed. She states that on 8/9 she developed cellulitis and was treated with 2 rounds of antibiotics. Her cellulitis has resolved. She had blisters that opened and are now large wounds. She  has seen her dermatologist and rheumatologist since she has systemic lupus and a hard time healing. No changes have been made to her medications. She was recommended antibiotic ointment by her dermatologist to her wounds She picked up the antibiotic ointment today.. She reports pain to these areas. She denies purulent drainage, increased warmth or erythema to the left lower extremity. She currently keeps the areas covered. Of note she had an Achilles tendon rupture on her right leg and is currently in a soft cast. 9/13; patient presents for follow-up. She missed her last clinic appointment. She has been using Santyl to the wound beds daily. She has no issues or complaints today. She reports the Santyl is helping clean up the wounds. She currently denies signs of infection. 9/22; patient states that 1 week ago she was seen by dermatology and they placed an Unna boot with Bactroban on her left lower extremity. Today she presents with a Unna boot in place. She currently denies signs of infection. 10/7; patient presents for follow-up. She reports improvement. She has been using Santyl to the areas of nonviable tissue and collagen to granulation tissue. She has been approved for infusions for her lupus. She is going to start these soon. These will be injection she can do at home. She currently denies signs of infection. 10/21; patient presents for follow-up.  She reports continued improvement to her wound healing. She has been using Santyl to all areas except for the superior posterior left leg wound and she is using collagen to this. She has started her injection therapy for lupus. She started 1 week ago. She currently denies signs of infection. 11/3; patient presents for follow-up. She has no issues or complaints today. She is on her third week of injection therapy for lupus and tolerating this well. She has no issues or complaints today. She denies signs of infection. 11/17; patient presents for 2-week follow-up. She reports improvement in wound healing. She denies signs of infection. 12/1; patient presents for follow-up. She has been using collagen to the anterior left lower extremity wounds and Santyl to the left lateral malleolus. She has no issues or complaints today. 12/15; patient presents for follow-up. She is using collagen to the 2 anterior left leg wounds and Santyl to the left lateral malleolus wound. She reports improvement in wound healing. She denies signs of infection. 12/22; patient presents because she is having purulent drainage from her anterior left leg wound. She reports no inciting event and states that the area started draining spontaneously. She was hoping that the drainage would stop however this has not improved. She states this started 5 days ago. She reports tenderness to the wound bed. 12/29; patient presents for follow-up. She was able to take Augmentin for 4 days but did not tolerate the GI side effects. She continues to take doxycycline. She reports improvement in drainage. She no longer has increased warmth to the surrounding wound bed. She ran out of [place] and has been using Vaseline to the wound beds. 1/6; Patient presents for follow-up. She has been taking Keflex without issues. She reports improvement in drainage and wound healing T the left lower o extremity. Unfortunately she developed another wound  spontaneously to the right anterior lower extremity. She denies systemic signs of infection. 1/16; patient presents for follow-up. Patient completed her first prescription of Keflex and has not started the second. She reports no more drainage to the wound beds. She states there was an issue with insurance however she is going to  be able to obtain Santyl soon. She has been using Dakin's wet-to-dry dressings to the right anterior leg and left lateral ankle. She has been using collagen to the distal anterior left leg wound and reports that the proximal anterior left leg wound has healed. She denies systemic signs of infection. 1/31; patient presents for follow-up. She reports improvement to the left leg wounds. She reports increased inflammation to the right leg wound. She thinks she is having a reaction to the foam border dressing on the right leg. She is unable to obtain Santyl. She has been using Dakin's wet-to-dry dressings to the right anterior leg and left lateral ankle. She has been using collagen to the distal anterior left leg wound. 2/13; patient presents for follow-up. She reports improvement in her wound healing. One of the wounds to the anterior aspect of the left lower leg has slightly reopened. She denies signs of infection. 2/27; patient presents for follow-up. She has no issues or complaints today. She denies signs of infection. Patient History Information obtained from Patient. Family History Diabetes - Paternal Grandparents, Hypertension - Paternal Grandparents, Thyroid Problems - Maternal Grandparents,Paternal Grandparents, No family history of Cancer, Heart Disease, Hereditary Spherocytosis, Kidney Disease, Lung Disease, Seizures, Stroke, Tuberculosis. Social History Never smoker, Marital Status - Married, Alcohol Use - Moderate, Drug Use - Current History - Marijuana, Caffeine Use - Daily - Coffee. Medical History Respiratory Patient has history of  Asthma Immunological Patient has history of Lupus Erythematosus Objective Constitutional respirations regular, non-labored and within target range for patient.. Vitals Time Taken: 3:02 PM, Height: 62 in, Temperature: 98.5 F, Pulse: 87 bpm, Respiratory Rate: 18 breaths/min, Blood Pressure: 116/79 mmHg. Cardiovascular 2+ dorsalis pedis/posterior tibialis pulses. Psychiatric pleasant and cooperative. General Notes: Left lower extremity: 2 wounds present. 1 to the anterior aspect with granulation tissue present. The other to the lateral ankle with granulation tissue present as well. No surrounding signs of infection. T the right lower extremity there is an open wound with nonviable tissue throughout. No drainage o noted. No signs of surrounding infection. Integumentary (Hair, Skin) Wound #4 status is Open. Original cause of wound was Gradually Appeared. The date acquired was: 04/14/2021. The wound has been in treatment 26 weeks. The wound is located on the Left,Lateral Ankle. The wound measures 1.1cm length x 1cm width x 0.1cm depth; 0.864cm^2 area and 0.086cm^3 volume. There is Fat Layer (Subcutaneous Tissue) exposed. There is no tunneling or undermining noted. There is a medium amount of serosanguineous drainage noted. The wound margin is distinct with the outline attached to the wound base. There is large (67-100%) pink granulation within the wound bed. There is a small (1-33%) amount of necrotic tissue within the wound bed including Adherent Slough. Wound #8 status is Open. Original cause of wound was Gradually Appeared. The date acquired was: 08/28/2021. The wound has been in treatment 7 weeks. The wound is located on the Right,Anterior Lower Leg. The wound measures 0.8cm length x 0.2cm width x 0.1cm depth; 0.126cm^2 area and 0.013cm^3 volume. There is Fat Layer (Subcutaneous Tissue) exposed. There is no tunneling noted, however, there is undermining starting at 12:00 and ending at 12:00 with  a maximum distance of 0.5cm. There is a medium amount of serous drainage noted. The wound margin is flat and intact. There is no granulation within the wound bed. There is a large (67-100%) amount of necrotic tissue within the wound bed including Adherent Slough. Wound #9 status is Open. Original cause of wound was Gradually Appeared. The date  acquired was: 10/09/2021. The wound has been in treatment 2 weeks. The wound is located on the Left,Anterior Lower Leg. The wound measures 0.4cm length x 0.4cm width x 0.1cm depth; 0.126cm^2 area and 0.013cm^3 volume. There is Fat Layer (Subcutaneous Tissue) exposed. There is no tunneling noted, however, there is undermining starting at 12:00 and ending at 12:00 with a maximum distance of 0.2cm. There is a medium amount of serosanguineous drainage noted. The wound margin is flat and intact. There is no granulation within the wound bed. There is a large (67-100%) amount of necrotic tissue within the wound bed including [facility] [place]. Assessment Active Problems ICD-10 Non-pressure chronic ulcer of other part of left lower leg with fat layer exposed Non-pressure chronic ulcer of other part of right lower leg with fat layer exposed Systemic lupus erythematosus, unspecified Cellulitis of left lower limb Patient's wounds have shown improvement in size and appearance since last clinic visit. No evidence of surrounding infection. I recommended continue with Medihoney to the right anterior leg wound and the left lateral ankle wound. I recommended collagen to the left anterior leg wound. Follow-up in 2 weeks. Plan Follow-up Appointments: Return Appointment in 2 weeks. - Dr. Mikey Bussing and Yvonne Kendall, Room 8 Bathing/ Shower/ Hygiene: May shower and wash wound with soap and water. Edema Control - Lymphedema / SCD / Other: Elevate legs to the level of the heart or above for 30 minutes daily and/or when sitting, a frequency of: - 3-4 times a day throughout the day. Avoid  standing for long periods of time. Additional Orders / Instructions: Follow Nutritious Diet WOUND #4: - Ankle Wound Laterality: Left, Lateral Cleanser: Soap and Water 1 x Per Day/30 Days Discharge Instructions: May shower and wash wound with dial antibacterial soap and water prior to dressing change. Prim Dressing: MediHoney Gel, tube 1.5 (oz) 1 x Per Day/30 Days ary Discharge Instructions: Apply to wound bed as instructed Secondary Dressing: Woven Gauze Sponge, Non-Sterile 4x4 in (Generic) 1 x Per Day/30 Days Discharge Instructions: Apply over primary dressing as directed. Secured With: [employer], 4.5x3.1 (in/yd) (Generic) 1 x Per Day/30 Days Discharge Instructions: Secure with Kerlix as directed. Secured With: Paper T ape, 2x10 (in/yd) (Generic) 1 x Per Day/30 Days Discharge Instructions: Secure dressing with tape as directed. WOUND #8: - Lower Leg Wound Laterality: Right, Anterior Peri-Wound Care: hydrocortisone cream 1 x Per Day/30 Days Discharge Instructions: apply to red, irritated periwound Prim Dressing: MediHoney Gel, tube 1.5 (oz) 1 x Per Day/30 Days ary Discharge Instructions: or Manuka honey Apply to wound bed as instructed Secondary Dressing: Woven Gauze Sponge, Non-Sterile 4x4 in 1 x Per Day/30 Days Discharge Instructions: Apply over primary dressing as directed. Secured With: Insurance underwriter, Sterile 2x75 (in/in) 1 x Per Day/30 Days Discharge Instructions: Secure with stretch gauze as directed. WOUND #9: - Lower Leg Wound Laterality: Left, Anterior Cleanser: Soap and Water Discharge Instructions: May shower and wash wound with dial antibacterial soap and water prior to dressing change. Cleanser: Wound Cleanser Discharge Instructions: Cleanse the wound with wound cleanser prior to applying a clean dressing using gauze sponges, not tissue or cotton balls. Prim Dressing: Promogran Prisma Matrix, 4.34 (sq in) (silver collagen) ary Discharge  Instructions: Moisten collagen with saline or hydrogel Prim Dressing: MediHoney Gel, tube 1.5 (oz) ary Discharge Instructions: Apply to wound bed as instructed Secondary Dressing: Woven Gauze Sponge, Non-Sterile 4x4 in Discharge Instructions: Apply over primary dressing as directed. Secondary Dressing: Conforming Stretch Gauze, 2x4.1 (in/yd) Secured With: Paper T  ape, 2x10 (in/yd) Discharge Instructions: Secure dressing with tape as directed. 1. Medihoney 2. Collagen 3. Follow-up in 2 weeks Electronic Signature(s) Signed: 10/23/2021 3:30:07 PM By: Geralyn Corwin DO Entered By: Geralyn Corwin on 10/23/2021 15:26:13 -------------------------------------------------------------------------------- HxROS Details Patient Name: Date of Service: Kelly Milling [name] 10/23/2021 2:15 PM Medical Record Number: [id number] Patient Account Number: [id number] Date of Birth/Sex: Treating RN: December 25, 1995 (25 y.o. F) Primary Care Provider: Other Clinician: Dione Booze Referring Provider: Treating Provider/Extender: Gennaro Africa in Treatment: 36 Information Obtained From Patient Respiratory Medical History: Positive for: Asthma Immunological Medical History: Positive for: Lupus Erythematosus Immunizations Pneumococcal Vaccine: Received Pneumococcal Vaccination: No Implantable Devices None Family and Social History Cancer: No; Diabetes: Yes - Paternal Grandparents; Heart Disease: No; Hereditary Spherocytosis: No; Hypertension: Yes - Paternal Grandparents; Kidney Disease: No; Lung Disease: No; Seizures: No; Stroke: No; Thyroid Problems: Yes - Maternal Grandparents,Paternal Grandparents; Tuberculosis: No; Never smoker; Marital Status - Married; Alcohol Use: Moderate; Drug Use: Current History - Marijuana; Caffeine Use: Daily - Coffee; Financial Concerns: No; Food, Clothing or Shelter Needs: No; Support System Lacking: No Electronic Signature(s) Signed:  10/23/2021 3:30:07 PM By: Geralyn Corwin DO Entered By: Geralyn Corwin on 10/23/2021 15:23:10 -------------------------------------------------------------------------------- SuperBill Details Patient Name: Date of Service: Kelly Milling RIA Rexene Edison M. 10/23/2021 Medical Record Number: [id number] Patient Account Number: [id number] Date of Birth/Sex: Treating RN: 1996-08-13 (25 y.o. Debara Pickett, Millard.Loa Primary Care Provider: Dione Booze Other Clinician: Referring Provider: Treating Provider/Extender: Gennaro Africa in Treatment: 36 Diagnosis Coding ICD-10 Codes Code Description [phone] Non-pressure chronic ulcer of other part of left lower leg with fat layer exposed L97.812 Non-pressure chronic ulcer of other part of right lower leg with fat layer exposed M32.9 Systemic lupus erythematosus, unspecified L03.116 Cellulitis of left lower limb Facility Procedures CPT4 Code: [id number] Description: [phone] - WOUND CARE VISIT-LEV 5 EST PT Modifier: Quantity: 1 Physician Procedures : CPT4 Code Description Modifier [id number] - WC PHYS LEVEL 3 - EST PT ICD-10 Diagnosis Description L97.822 Non-pressure chronic ulcer of other part of left lower leg with fat layer exposed L97.812 Non-pressure chronic ulcer of other part of right  lower leg with fat layer exposed M32.9 Systemic lupus erythematosus, unspecified Quantity: 1 Electronic Signature(s) Signed: 10/23/2021 3:30:07 PM By: Geralyn Corwin DO Entered By: Geralyn Corwin on 10/23/2021 15:26:29

## 2021-11-06 ENCOUNTER — Ambulatory Visit (HOSPITAL_BASED_OUTPATIENT_CLINIC_OR_DEPARTMENT_OTHER): Payer: Medicaid Other | Admitting: Internal Medicine

## 2021-11-13 ENCOUNTER — Encounter (HOSPITAL_BASED_OUTPATIENT_CLINIC_OR_DEPARTMENT_OTHER): Payer: BC Managed Care – PPO | Attending: Internal Medicine | Admitting: Internal Medicine

## 2021-11-13 ENCOUNTER — Other Ambulatory Visit: Payer: Self-pay

## 2021-11-13 DIAGNOSIS — Z7969 Long term (current) use of other immunomodulators and immunosuppressants: Secondary | ICD-10-CM | POA: Insufficient documentation

## 2021-11-13 DIAGNOSIS — L97812 Non-pressure chronic ulcer of other part of right lower leg with fat layer exposed: Secondary | ICD-10-CM | POA: Diagnosis not present

## 2021-11-13 DIAGNOSIS — L03116 Cellulitis of left lower limb: Secondary | ICD-10-CM | POA: Diagnosis not present

## 2021-11-13 DIAGNOSIS — L97822 Non-pressure chronic ulcer of other part of left lower leg with fat layer exposed: Secondary | ICD-10-CM | POA: Insufficient documentation

## 2021-11-13 DIAGNOSIS — M329 Systemic lupus erythematosus, unspecified: Secondary | ICD-10-CM | POA: Insufficient documentation

## 2021-11-13 NOTE — Progress Notes (Signed)
RAMLA, [id number] ([id number]) ?Visit Report for 11/13/2021 ?Arrival Information Details ?Patient Name: Date of Service: ?Demeo, [name] 11/13/2021 1:30 PM ?Medical Record Number: [id number] ?Patient Account Number: [id number] ?Date of Birth/Sex: Treating RN: ?1996/07/08 (25 y.o. F) Kelly [name] ?Primary Care Meah Jiron: [name] Other Clinician: ?Referring Jisel Fleet: ?Treating Onya Eutsler/Extender: Kalman Shan ?[name] ?Weeks in Treatment: 39 ?Visit Information History Since Last Visit ?Added or deleted any medications: No ?Patient Arrived: Ambulatory ?Any new allergies or adverse reactions: No ?Arrival Time: 13:51 ?Had a fall or experienced change in No ?Accompanied By: self ?activities of daily living that may affect ?Transfer Assistance: None ?risk of falls: ?Patient Identification Verified: Yes ?Signs or symptoms of abuse/neglect since last visito No ?Secondary Verification Process Completed: Yes ?Hospitalized since last visit: No ?Patient Requires Transmission-Based Precautions: No ?Implantable device outside of the clinic excluding No ?Patient Has Alerts: Yes ?cellular tissue based products placed in the center ?Patient Alerts: Left ABi=1.18 since last visit: ?Has Dressing in Place as Prescribed: Yes ?Pain Present Now: No ?Electronic Signature(s) ?Signed: 11/13/2021 4:35:29 PM By: Deon Pilling RN, BSN ?Entered By: Deon Pilling on 11/13/2021 13:53:15 ?-------------------------------------------------------------------------------- ?Encounter Discharge Information Details ?Patient Name: Date of Service: ?Amrhein, [name] 11/13/2021 1:30 PM ?Medical Record Number: [id number] ?Patient Account Number: [id number] ?Date of Birth/Sex: Treating RN: ?1996-07-26 (25 y.o. F) Kelly [name] ?Primary Care Misako Roeder: [name] Other Clinician: ?Referring Quina Wilbourne: ?Treating Maree Ainley/Extender: Kalman Shan ?[name] ?Weeks in Treatment: 39 ?Encounter Discharge Information  Items ?Discharge Condition: Stable ?Ambulatory Status: Ambulatory ?Discharge Destination: Home ?Transportation: Private Auto ?Accompanied By: self ?Schedule Follow-up Appointment: Yes ?Clinical Summary of Care: ?Electronic Signature(s) ?Signed: 11/13/2021 4:35:29 PM By: Deon Pilling RN, BSN ?Entered By: Deon Pilling on 11/13/2021 14:21:58 ?-------------------------------------------------------------------------------- ?Lower Extremity Assessment Details ?Patient Name: ?Date of Service: ?Mennen, [name] 11/13/2021 1:30 PM ?Medical Record Number: [id number] ?Patient Account Number: [id number] ?Date of Birth/Sex: ?Treating RN: ?20-Feb-1996 (25 y.o. F) Kelly [name] ?Primary Care Jaray Boliver: [name] ?Other Clinician: ?Referring Dewarren Ledbetter: ?Treating Tihanna Goodson/Extender: Kalman Shan ?[name] ?Weeks in Treatment: 39 ?Edema Assessment ?Assessed: [Left: Yes] [Right: Yes] ?Edema: [Left: No] [Right: Yes] ?Calf ?Left: Right: ?Point of Measurement: 27 cm From Medial Instep 32 cm 31 cm ?Ankle ?Left: Right: ?Point of Measurement: 8 cm From Medial Instep 21 cm 21 cm ?Vascular Assessment ?Pulses: ?Dorsalis Pedis ?Palpable: [Left:Yes] [Right:Yes] ?Electronic Signature(s) ?Signed: 11/13/2021 4:35:29 PM By: Deon Pilling RN, BSN ?Entered By: Deon Pilling on 11/13/2021 14:03:22 ?-------------------------------------------------------------------------------- ?Multi Wound Chart Details ?Patient Name: ?Date of Service: ?Fambrough, [name] 11/13/2021 1:30 PM ?Medical Record Number: [id number] ?Patient Account Number: [id number] ?Date of Birth/Sex: ?Treating RN: ?Mar 04, 1996 (25 y.o. F) Kelly [name] ?Primary Care Rechel Delosreyes: [name] ?Other Clinician: ?Referring Albi Rappaport: ?Treating Qusai Kem/Extender: Kalman Shan ?[name] ?Weeks in Treatment: 39 ?Vital Signs ?Height(in): 62 ?Pulse(bpm): 83 ?Weight(lbs): ?Blood Pressure(mmHg): 124/86 ?Body Mass Index(BMI): ?Temperature(??F): 99 ?Respiratory  Rate(breaths/min): 20 ?Photos: [4:Left, Lateral Ankle] [8:Right, Anterior Lower Leg] [9:Left, Anterior Lower Leg] ?Wound Location: [4:Gradually Appeared] [8:Gradually Appeared] [9:Gradually Appeared] ?Wounding Event: [4:Cellulitis] [8:Lupus] [9:Lupus] ?Primary Etiology: [4:Lupus] [8:N/A] [9:N/A] ?Secondary Etiology: [4:Asthma, Lupus Erythematosus] [8:Asthma, Lupus Erythematosus] [9:Asthma, Lupus Erythematosus] ?Comorbid History: [4:04/14/2021] [8:08/28/2021] [9:10/09/2021] ?Date Acquired: [4:29] [8:10] [9:5] ?Weeks of Treatment: [4:Open] [8:Open] [9:Open] ?Wound Status: [4:No] [8:No] [9:No] ?Wound Recurrence: [4:0.7x0.6x0.4] [8:0.8x0.5x0.3] [9:0.1x0.1x0.1] ?Measurements L x W x D (cm) [4:0.33] [8:0.314] [9:0.008] ?A (cm?) : ?rea [4:0.132] [8:0.094] [9:0.001] ?Volume (cm?) : [4:97.90%] [8:38.60%] [9:89.90%] ?% Reduction in A rea: [4:97.90%] [8:-84.30%] [9:87.50%] ?% Reduction in Volume: [4:Full Thickness  Without Exposed] [8:Full Thickness Without Exposed] [9:Full Thickness Without Exposed] ?Classification: [4:Support Structures Medium] [8:Support Structures Medium] [9:Support Structures Medium] ?Exudate A mount: [4:Serosanguineous] [8:Serous] [9:Serosanguineous] ?Exudate Type: [4:red, brown] [8:amber] [9:red, brown] ?Exudate Color: [4:Distinct, outline attached] [8:Epibole] [9:Flat and Intact] ?Wound Margin: [4:Medium (34-66%)] [8:Medium (34-66%)] [9:Large (67-100%)] ?Granulation A mount: [4:Pink] [8:Pink, Pale] [9:Red] ?Granulation Quality: [4:Medium (34-66%)] [8:Medium (34-66%)] [9:None Present (0%)] ?Necrotic A mount: ?[4:Fat Layer (Subcutaneous Tissue): Yes Fat Layer (Subcutaneous Tissue): Yes Fat Layer (Subcutaneous Tissue): Yes] ?Exposed Structures: ?[4:Fascia: No Tendon: No Muscle: No Joint: No Bone: No Small (1-33%)] [8:Fascia: No Tendon: No Muscle: No Joint: No Bone: No None] [9:Fascia: No Tendon: No Muscle: No Joint: No Bone: No Large (67-100%)] ?Epithelialization: [4:Debridement - Excisional] [8:N/A]  [9:N/A] ?Debridement: ?Pre-procedure Verification/Time Out 14:20 [8:N/A] [9:N/A] ?Taken: [4:Lidocaine 5% topical ointment] [8:N/A] [9:N/A] ?Pain Control: [4:Subcutaneous, Slough] [8:N/A] [9:N/A] ?Tissue Debrided: [4:Skin/Subcutaneous Tissue] [8:N/A] [9:N/A] ?Level: [4:0.56] [8:N/A] [9:N/A] ?Debridement A (sq cm): [4:rea Curette] [8:N/A] [9:N/A] ?Instrument: [4:Minimum] [8:N/A] [9:N/A] ?Bleeding: [4:Pressure] [8:N/A] [9:N/A] ?Hemostasis A chieved: [4:0] [8:N/A] [9:N/A] ?Procedural Pain: [4:0] [8:N/A] [9:N/A] ?Post Procedural Pain: [4:Procedure was tolerated well] [8:N/A] [9:N/A] ?Debridement Treatment Response: [4:0.7x0.6x0.4] [8:N/A] [9:N/A] ?Post Debridement Measurements L x ?W x D (cm) [4:0.132] [8:N/A] [9:N/A] ?Post Debridement Volume: (cm?) [4:Debridement] [8:N/A] [9:N/A] ?Treatment Notes ?Wound #4 (Ankle) Wound Laterality: Left, Lateral ?Cleanser ?Soap and Water ?Discharge Instruction: May shower and wash wound with dial antibacterial soap and water prior to dressing change. ?Peri-Wound Care ?Topical ?Primary Dressing ?MediHoney Gel, tube 1.5 (oz) ?Discharge Instruction: Apply to wound bed as instructed ?Secondary Dressing ?Woven Gauze Sponge, Non-Sterile 4x4 in ?Discharge Instruction: Apply over primary dressing as directed. ?Secured With ?Kerlix Roll Sterile, 4.5x3.1 (in/yd) ?Discharge Instruction: Secure with Kerlix as directed. ?Paper Tape, 2x10 (in/yd) ?Discharge Instruction: Secure dressing with tape as directed. ?Compression Wrap ?Compression Stockings ?Add-Ons ?Wound #8 (Lower Leg) Wound Laterality: Right, Anterior ?Cleanser ?Peri-Wound Care ?hydrocortisone cream ?Discharge Instruction: apply to red, irritated periwound ?Topical ?Primary Dressing ?MediHoney Gel, tube 1.5 (oz) ?Discharge Instruction: or Manuka honey Apply to wound bed as instructed ?Secondary Dressing ?Woven Gauze Sponge, Non-Sterile 4x4 in ?Discharge Instruction: Apply over primary dressing as directed. ?Secured With ?Conforming Stretch  Gauze Bandage, Sterile 2x75 (in/in) ?Discharge Instruction: Secure with stretch gauze as directed. ?Compression Wrap ?Compression Stockings ?Add-Ons ?Wound #9 (Lower Leg) Wound Laterality: Left, Anterior ?Cleanser ?Soap and Water

## 2021-11-13 NOTE — Progress Notes (Signed)
KAYSIA, WILLARD. ([id number]) ?Visit Report for 11/13/2021 ?Chief Complaint Document Details ?Patient Name: Date of Service: ?Lipton, [name] 11/13/2021 1:30 PM ?Medical Record Number: [id number] ?Patient Account Number: [id number] ?Date of Birth/Sex: Treating RN: ?[birth year] (26 y.o. F) [name], [name] ?Primary Care Provider: Dione Booze Other Clinician: ?Referring Provider: ?Treating Provider/Extender: Geralyn Corwin ?Dione Booze ?Weeks in Treatment: 39 ?Information Obtained from: Patient ?Chief Complaint ?Bilateral lower extremity wounds ?04/21/21: left lower extremity wound s/p cellulitis ?09/01/21; right lower extremity wound ?Electronic Signature(s) ?Signed: 11/13/2021 2:57:16 PM By: Geralyn Corwin DO ?Entered By: Geralyn Corwin on 11/13/2021 14:51:52 ?-------------------------------------------------------------------------------- ?Debridement Details ?Patient Name: Date of Service: ?Raker, [name] 11/13/2021 1:30 PM ?Medical Record Number: [id number] ?Patient Account Number: [id number] ?Date of Birth/Sex: Treating RN: ?[birth year] (26 y.o. F) [name], [name] ?Primary Care Provider: Dione Booze Other Clinician: ?Referring Provider: ?Treating Provider/Extender: Geralyn Corwin ?Dione Booze ?Weeks in Treatment: 39 ?Debridement Performed for Assessment: Wound #4 Left,Lateral Ankle ?Performed By: Physician Geralyn Corwin, DO ?Debridement Type: Debridement ?Level of Consciousness (Pre-procedure): Awake and Alert ?Pre-procedure Verification/Time Out Yes - 14:20 ?Taken: ?Start Time: 14:21 ?Pain Control: Lidocaine 5% topical ointment ?T Area Debrided (L x W): ?otal 0.8 (cm) x 0.7 (cm) = 0.56 (cm?) ?Tissue and other material debrided: ?Viable, Non-Viable, Slough, Subcutaneous, Skin: Dermis , Skin: Epidermis, Fibrin/Exudate, Slough ?Level: Skin/Subcutaneous Tissue ?Debridement Description: Excisional ?Instrument: Curette ?Bleeding: Minimum ?Hemostasis Achieved: Pressure ?End Time:  14:23 ?Procedural Pain: 0 ?Post Procedural Pain: 0 ?Response to Treatment: Procedure was tolerated well ?Level of Consciousness (Post- Awake and Alert ?procedure): ?Post Debridement Measurements of Total Wound ?Length: (cm) 0.7 ?Width: (cm) 0.6 ?Depth: (cm) 0.4 ?Volume: (cm?) 0.132 ?Character of Wound/Ulcer Post Debridement: Improved ?Post Procedure Diagnosis ?Same as Pre-procedure ?Electronic Signature(s) ?Signed: 11/13/2021 2:57:16 PM By: Geralyn Corwin DO ?Signed: 11/13/2021 4:35:29 PM By: Shawn Stall RN, BSN ?Entered By: Shawn Stall on 11/13/2021 14:23:40 ?-------------------------------------------------------------------------------- ?HPI Details ?Patient Name: Date of Service: ?Gressman, [name] 11/13/2021 1:30 PM ?Medical Record Number: [id number] ?Patient Account Number: [id number] ?Date of Birth/Sex: Treating RN: ?[birth year] (26 y.o. F) [name], [name] ?Primary Care Provider: Dione Booze Other Clinician: ?Referring Provider: ?Treating Provider/Extender: Geralyn Corwin ?Dione Booze ?Weeks in Treatment: 39 ?History of Present Illness ?HPI Description: Admission 6/21 ?Ms. Cardelia Sassano is A 26 year old female with a past medical history of systemic lupus erythematosus that presents with bilateral lower extremity wounds ?that started in April 2022. She has had skin issues in the past where she developed very small wounds but these healed with time. She has never had open ?wounds like she does on her legs. She currently reports minimal drainage to the wounds. She does have pain to these areas but has been overall stable. She ?denies signs of infection. She has visited the ED for this issue and was recently prescribed doxycycline for possible cellulitis. She follows with [facility] ?rheumatology for her SLE and is currently on Plaquenil, prednisone and CellCept. ?6/27; patient presents for 1 week follow-up. She states she saw her rheumatologist on 6/22 and prednisone was increased from 10 mg to 20 mg  daily. She is ?currently at the highest dose of Plaquenil and CellCept and is going to try an infusion later today at her regimen. She reports improvement to the wounds. She ?has been using collagen with dressing changes. She currently denies signs of infection. ?Readmission 8/26 ?Patient presents to clinic today for open wounds to her left lower extremity. Her previous wounds that were treated have closed. She states that on 8/9 she ?developed  cellulitis and was treated with 2 rounds of antibiotics. Her cellulitis has resolved. She had blisters that opened and are now large wounds. She has ?seen her dermatologist and rheumatologist since she has systemic lupus and a hard time healing. No changes have been made to her medications. She was ?recommended antibiotic ointment by her dermatologist to her wounds She picked up the antibiotic ointment today.. She reports pain to these areas. She denies ?purulent drainage, increased warmth or erythema to the left lower extremity. She currently keeps the areas covered. ?Of note she had an Achilles tendon rupture on her right leg and is currently in a soft cast. ?9/13; patient presents for follow-up. She missed her last clinic appointment. She has been using Santyl to the wound beds daily. She has no issues or ?complaints today. She reports the Santyl is helping clean up the wounds. She currently denies signs of infection. ?9/22; patient states that 1 week ago she was seen by dermatology and they placed an Unna boot with Bactroban on her left lower extremity. Today she ?presents with a Unna boot in place. She currently denies signs of infection. ?10/7; patient presents for follow-up. She reports improvement. She has been using Santyl to the areas of nonviable tissue and collagen to granulation tissue. ?She has been approved for infusions for her lupus. She is going to start these soon. These will be injection she can do at home. She currently denies signs of ?infection. ?10/21;  patient presents for follow-up. She reports continued improvement to her wound healing. She has been using Santyl to all areas except for the superior ?posterior left leg wound and she is using collagen to this. She has started her injection therapy for lupus. She started 1 week ago. She currently denies signs of ?infection. ?11/3; patient presents for follow-up. She has no issues or complaints today. She is on her third week of injection therapy for lupus and tolerating this well. She ?has no issues or complaints today. She denies signs of infection. ?11/17; patient presents for 2-week follow-up. She reports improvement in wound healing. She denies signs of infection. ?12/1; patient presents for follow-up. She has been using collagen to the anterior left lower extremity wounds and Santyl to the left lateral malleolus. She has no ?issues or complaints today. ?12/15; patient presents for follow-up. She is using collagen to the 2 anterior left leg wounds and Santyl to the left lateral malleolus wound. She reports ?improvement in wound healing. She denies signs of infection. ?12/22; patient presents because she is having purulent drainage from her anterior left leg wound. She reports no inciting event and states that the area started ?draining spontaneously. She was hoping that the drainage would stop however this has not improved. She states this started 5 days ago. She reports ?tenderness to the wound bed. ?12/29; patient presents for follow-up. She was able to take Augmentin for 4 days but did not tolerate the GI side effects. She continues to take doxycycline. ?She reports improvement in drainage. She no longer has increased warmth to the surrounding wound bed. She ran out of [place] and has been using Vaseline to ?the wound beds. ?1/6; Patient presents for follow-up. She has been taking Keflex without issues. She reports improvement in drainage and wound healing T the left lower ?o ?extremity. Unfortunately she  developed another wound spontaneously to the right anterior lower extremity. She denies systemic signs of infection. ?1/16; patient presents for follow-up. Patient completed her first prescription of Keflex and

## 2021-11-27 ENCOUNTER — Encounter (HOSPITAL_BASED_OUTPATIENT_CLINIC_OR_DEPARTMENT_OTHER): Payer: Medicaid Other | Admitting: Internal Medicine

## 2021-12-04 ENCOUNTER — Encounter (HOSPITAL_BASED_OUTPATIENT_CLINIC_OR_DEPARTMENT_OTHER): Payer: BC Managed Care – PPO | Attending: Internal Medicine | Admitting: Internal Medicine

## 2021-12-04 DIAGNOSIS — L97322 Non-pressure chronic ulcer of left ankle with fat layer exposed: Secondary | ICD-10-CM | POA: Diagnosis present

## 2021-12-04 DIAGNOSIS — M329 Systemic lupus erythematosus, unspecified: Secondary | ICD-10-CM | POA: Insufficient documentation

## 2021-12-04 DIAGNOSIS — L97812 Non-pressure chronic ulcer of other part of right lower leg with fat layer exposed: Secondary | ICD-10-CM | POA: Diagnosis not present

## 2021-12-04 NOTE — Progress Notes (Signed)
QUINLYN, TEP. ([id number]) ?Visit Report for 12/04/2021 ?HPI Details ?Patient Name: Date of Service: ?[name], [name] 12/04/2021 3:00 PM ?Medical Record Number: [id number] ?Patient Account Number: [id number] ?Date of Birth/Sex: Treating RN: ?1995/12/10 (25 y.o. F) [name], Kelly ?Primary Care Provider: Dione [name] Other Clinician: ?Referring Provider: ?Treating Provider/Extender: Kelly [name] ?Kelly [name] ?Weeks in Treatment: 42 ?History of Present Illness ?HPI Description: Admission 6/21 ?[name] with a past medical history of systemic lupus erythematosus that presents with bilateral lower extremity wounds ?that started in April 2022. She has had skin issues in the past where she developed very small wounds but these healed with time. She has never had open ?wounds like she does on her legs. She currently reports minimal drainage to the wounds. She does have pain to these areas but has been overall stable. She ?denies signs of infection. She has visited the ED for this issue and was recently prescribed doxycycline for possible cellulitis. She follows with [facility] ?rheumatology for her SLE and is currently on Plaquenil, prednisone and CellCept. ?6/27; patient presents for 1 week follow-up. She states she saw her rheumatologist on 6/22 and prednisone was increased from 10 mg to 20 mg daily. She is ?currently at the highest dose of Plaquenil and CellCept and is going to try an infusion later today at her regimen. She reports improvement to the wounds. She ?has been using collagen with dressing changes. She currently denies signs of infection. ?Readmission 8/26 ?Patient presents to clinic today for open wounds to her left lower extremity. Her previous wounds that were treated have closed. She states that on 8/9 she ?developed cellulitis and was treated with 2 rounds of antibiotics. Her cellulitis has resolved. She had blisters that opened and are now large wounds.  She has ?seen her dermatologist and rheumatologist since she has systemic lupus and a hard time healing. No changes have been made to her medications. She was ?recommended antibiotic ointment by her dermatologist to her wounds She picked up the antibiotic ointment today.. She reports pain to these areas. She denies ?purulent drainage, increased warmth or erythema to the left lower extremity. She currently keeps the areas covered. ?Of note she had an Achilles tendon rupture on her right leg and is currently in a soft cast. ?9/13; patient presents for follow-up. She missed her last clinic appointment. She has been using Santyl to the wound beds daily. She has no issues or ?complaints today. She reports the Santyl is helping clean up the wounds. She currently denies signs of infection. ?9/22; patient states that 1 week ago she was seen by dermatology and they placed an Unna boot with Bactroban on her left lower extremity. Today she ?presents with a Unna boot in place. She currently denies signs of infection. ?10/7; patient presents for follow-up. She reports improvement. She has been using Santyl to the areas of nonviable tissue and collagen to granulation tissue. ?She has been approved for infusions for her lupus. She is going to start these soon. These will be injection she can do at home. She currently denies signs of ?infection. ?10/21; patient presents for follow-up. She reports continued improvement to her wound healing. She has been using Santyl to all areas except for the superior ?posterior left leg wound and she is using collagen to this. She has started her injection therapy for lupus. She started 1 week ago. She currently denies signs of ?infection. ?11/3; patient presents for follow-up. She has no issues or complaints  today. She is on her third week of injection therapy for lupus and tolerating this well. She ?has no issues or complaints today. She denies signs of infection. ?11/17; patient presents for  2-week follow-up. She reports improvement in wound healing. She denies signs of infection. ?12/1; patient presents for follow-up. She has been using collagen to the anterior left lower extremity wounds and Santyl to the left lateral malleolus. She has no ?issues or complaints today. ?12/15; patient presents for follow-up. She is using collagen to the 2 anterior left leg wounds and Santyl to the left lateral malleolus wound. She reports ?improvement in wound healing. She denies signs of infection. ?12/22; patient presents because she is having purulent drainage from her anterior left leg wound. She reports no inciting event and states that the area started ?draining spontaneously. She was hoping that the drainage would stop however this has not improved. She states this started 5 days ago. She reports ?tenderness to the wound bed. ?12/29; patient presents for follow-up. She was able to take Augmentin for 4 days but did not tolerate the GI side effects. She continues to take doxycycline. ?She reports improvement in drainage. She no longer has increased warmth to the surrounding wound bed. She ran out of Beulah and has been using Vaseline to ?the wound beds. ?1/6; Patient presents for follow-up. She has been taking Keflex without issues. She reports improvement in drainage and wound healing T the left lower ?o ?extremity. Unfortunately she developed another wound spontaneously to the right anterior lower extremity. She denies systemic signs of infection. ?1/16; patient presents for follow-up. Patient completed her first prescription of Keflex and has not started the second. She reports no more drainage to the ?wound beds. She states there was an issue with insurance however she is going to be able to obtain Santyl soon. She has been using Dakin's wet-to-dry ?dressings to the right anterior leg and left lateral ankle. She has been using collagen to the distal anterior left leg wound and reports that the proximal  anterior ?left leg wound has healed. She denies systemic signs of infection. ?1/31; patient presents for follow-up. She reports improvement to the left leg wounds. She reports increased inflammation to the right leg wound. She thinks she ?is having a reaction to the foam border dressing on the right leg. She is unable to obtain Santyl. She has been using Dakin's wet-to-dry dressings to the right ?anterior leg and left lateral ankle. She has been using collagen to the distal anterior left leg wound. ?2/13; patient presents for follow-up. She reports improvement in her wound healing. One of the wounds to the anterior aspect of the left lower leg has slightly ?reopened. She denies signs of infection. ?2/27; patient presents for follow-up. She has no issues or complaints today. She denies signs of infection. ?3/20; patient presents for follow-up. She has been using Medihoney to the right lower extremity wound and left lateral ankle wound. She continues to use ?collagen to the left anterior leg wound. She has no issues or complaints today. She denies signs of infection. She reports improvement in wound ?4/10; patient has been using Medihoney to the right anterior lower leg wound and silver collagen on the left including the left ankle and left anterior. Fortunately ?the left anterior lower leg wound is healed. Both of the areas on the left ankle and the right anterior lower leg appear clean. ?These wounds are in the setting of chronic lupus. She has had multitude of chronic wounds and a  rash on her arms. She tells me she has [facility] both her [facility]and [facility] and [facility] although she is not had an appointment this year. She does not have a ?history of blood clots either arterial or venous. ?Electronic Signature(s) ?Signed: 12/04/2021 4:41:48 PM By: Kelly Najjarobson, Monzerrath Mcburney MD ?Entered By: Kelly Najjarobson, Jihad Brownlow on 12/04/2021  16:38:33 ?-------------------------------------------------------------------------------- ?Physical Exam Details ?Patient Name: Date of Service: ?[name], [name] 12/04/2021 3:00 PM ?Medical Record Number: [id number] ?Patient Account Number: [id number] ?Date of Bi

## 2021-12-04 NOTE — Progress Notes (Signed)
[name], [name]. ([id number]) ?Visit Report for 12/04/2021 ?Arrival Information Details ?Patient Name: Date of Service: ?[name], [name] 12/04/2021 3:00 PM ?Medical Record Number: [id number] ?Patient Account Number: [id number] ?Date of Birth/Sex: Treating RN: ?07-10-1996 (26 y.o. F) [name], Kelly ?Primary Care Kelly [name]: Kelly [name] Other Clinician: ?Referring Kelly [name]: ?Treating Kelly [name]/Extender: Kelly [name] ?Kelly [name] ?Weeks in Treatment: 42 ?Visit Information History Since Last Visit ?Added or deleted any medications: No ?Patient Arrived: Ambulatory ?Any new allergies or adverse reactions: No ?Arrival Time: 15:16 ?Had a fall or experienced change in No ?Accompanied By: self ?activities of daily living that may affect ?Transfer Assistance: None ?risk of falls: ?Patient Identification Verified: Yes ?Signs or symptoms of abuse/neglect since last visito No ?Secondary Verification Process Completed: Yes ?Hospitalized since last visit: No ?Patient Requires Transmission-Based Precautions: No ?Implantable device outside of the clinic excluding No ?Patient Has Alerts: Yes ?cellular tissue based products placed in the center ?Patient Alerts: Left ABi=1.18 since last visit: ?Has Dressing in Place as Prescribed: Yes ?Pain Present Now: No ?Electronic Signature(s) ?Signed: 12/04/2021 5:32:14 PM By: Kelly Stall RN, BSN ?Entered By: Kelly [name] on 12/04/2021 15:16:37 ?-------------------------------------------------------------------------------- ?Clinic Level of Care Assessment Details ?Patient Name: Date of Service: ?[name], [name] 12/04/2021 3:00 PM ?Medical Record Number: [id number] ?Patient Account Number: [id number] ?Date of Birth/Sex: Treating RN: ?1995-09-26 (26 y.o. F) [name], Kelly ?Primary Care Kelly [name]: Kelly [name] Other Clinician: ?Referring Kelly [name]: ?Treating Kelly [name]/Extender: Kelly [name] ?Kelly [name] ?Weeks in Treatment: 42 ?Clinic Level of Care Assessment Items ?TOOL 4  Quantity Score ?X- 1 0 ?Use when only an EandM is performed on FOLLOW-UP visit ?ASSESSMENTS - Nursing Assessment / Reassessment ?X- 1 10 ?Reassessment of Co-morbidities (includes updates in patient status) ?X- 1 5 ?Reassessment of Adherence to Treatment Plan ?ASSESSMENTS - Wound and Skin A ssessment / Reassessment ?[]  - 0 ?Simple Wound Assessment / Reassessment - one wound ?X- 3 5 ?Complex Wound Assessment / Reassessment - multiple wounds ?X- 1 10 ?Dermatologic / Skin Assessment (not related to wound area) ?ASSESSMENTS - Focused Assessment ?X- 2 5 ?Circumferential Edema Measurements - multi extremities ?[]  - 0 ?Nutritional Assessment / Counseling / Intervention ?[]  - 0 ?Lower Extremity Assessment (monofilament, tuning fork, pulses) ?[]  - 0 ?Peripheral Arterial Disease Assessment (using hand held doppler) ?ASSESSMENTS - Ostomy and/or Continence Assessment and Care ?[]  - 0 ?Incontinence Assessment and Management ?[]  - 0 ?Ostomy Care Assessment and Management (repouching, etc.) ?PROCESS - Coordination of Care ?[]  - 0 ?Simple Patient / Family Education for ongoing care ?X- 1 20 ?Complex (extensive) Patient / Family Education for ongoing care ?X- 1 10 ?Staff obtains Consents, Records, T Results / Process Orders ?est ?[]  - 0 ?Staff telephones HHA, Nursing Homes / Clarify orders / etc ?[]  - 0 ?Routine Transfer to another Facility (non-emergent condition) ?[]  - 0 ?Routine Hospital Admission (non-emergent condition) ?[]  - 0 ?New Admissions / / Ordering NPWT Apligraf, etc. ?, ?[]  - 0 ?Emergency Hospital Admission (emergent condition) ?[]  - 0 ?Simple Discharge Coordination ?X- 1 15 ?Complex (extensive) Discharge Coordination ?PROCESS - Special Needs ?[]  - 0 ?Pediatric / Minor Patient Management ?[]  - 0 ?Isolation Patient Management ?[]  - 0 ?Hearing / Language / Visual special needs ?[]  - 0 ?Assessment of Community assistance (transportation, D/C planning, etc.) ?[]  - 0 ?Additional assistance / Altered  mentation ?[]  - 0 ?Support Surface(s) Assessment (bed, cushion, seat, etc.) ?INTERVENTIONS - Wound Cleansing / Measurement ?[]  - 0 ?Simple Wound Cleansing - one wound ?X- 3 5 ?Complex Wound Cleansing -  multiple wounds ?X- 1 5 ?Wound Imaging (photographs - any number of wounds) ?[]  - 0 ?Wound Tracing (instead of photographs) ?[]  - 0 ?Simple Wound Measurement - one wound ?X- 3 5 ?Complex Wound Measurement - multiple wounds ?INTERVENTIONS - Wound Dressings ?X - Small Wound Dressing one or multiple wounds 2 10 ?[]  - 0 ?Medium Wound Dressing one or multiple wounds ?[]  - 0 ?Large Wound Dressing one or multiple wounds ?[]  - 0 ?Application of Medications - topical ?[]  - 0 ?Application of Medications - injection ?INTERVENTIONS - Miscellaneous ?[]  - 0 ?External ear exam ?[]  - 0 ?Specimen Collection (cultures, biopsies, blood, body fluids, etc.) ?[]  - 0 ?Specimen(s) / Culture(s) sent or taken to Lab for analysis ?[]  - 0 ?Patient Transfer (multiple staff / / Similar devices) ?[]  - 0 ?Simple Staple / Suture removal (25 or less) ?[]  - 0 ?Complex Staple / Suture removal (26 or more) ?[]  - 0 ?Hypo / Hyperglycemic Management (close monitor of Blood Glucose) ?[]  - 0 ?Ankle / Brachial Index (ABI) - do not check if billed separately ?X- 1 5 ?Vital Signs ?Has the patient been seen at the hospital within the last three years: Yes ?Total Score: 155 ?Level Of Care: New/Established - Level 4 ?Electronic Signature(s) ?Signed: 12/04/2021 5:32:14 PM By: RN, BSN ?Entered By: on 12/04/2021 15:43:35 ?-------------------------------------------------------------------------------- ?Encounter Discharge Information Details ?Patient Name: Date of Service: ?[name], [name] 12/04/2021 3:00 PM ?Medical Record Number: ?Patient Account Number: ?Date of Birth/Sex: Treating RN: ?1996-07-23 (26 y.o. F) [name], Kelly ?Primary Care Ernesteen Mihalic: Nurse, adult Other Clinician: ?Referring  Colt Martelle: ?Treating Quindon Denker/Extender: ? ?Weeks in Treatment: 42 ?Encounter Discharge Information Items ?Discharge Condition: Stable ?Ambulatory Status: Ambulatory ?Discharge Destination: Home ?Transportation: Private Auto ?Accompanied By: self ?Schedule Follow-up Appointment: Yes ?Clinical Summary of Care: ?Electronic Signature(s) ?Signed: 12/04/2021 5:32:14 PM By: RN, BSN ?Entered By: 02/03/2022 on 12/04/2021 15:44:16 ?-------------------------------------------------------------------------------- ?Lower Extremity Assessment Details ?Patient Name: Date of Service: ?[name], [name] 12/04/2021 3:00 PM ?Medical Record Number: 02/03/2022 ?Patient Account Number: 02/03/2022 ?Date of Birth/Sex: Treating RN: ?Dec 30, 1995 (26 y.o. F) [name], Kelly ?Primary Care Cobey Raineri: 03/19/1996 Other Clinician: ?Referring Alexes Menchaca: ?Treating Reginold Beale/Extender: 26 ?Kelly [name] ?Weeks in Treatment: 42 ?Edema Assessment ?Assessed: [Left: Yes] [Right: Yes] ?Edema: [Left: Yes] [Right: Yes] ?Calf ?Left: Right: ?Point of Measurement: 27 cm From Medial Instep 34 cm 33 cm ?Ankle ?Left: Right: ?Point of Measurement: 8 cm From Medial Instep 22 cm 22 cm ?Vascular Assessment ?Pulses: ?Dorsalis Pedis ?Palpable: [Left:Yes] [Right:Yes] ?Electronic Signature(s) ?Signed: 12/04/2021 5:32:14 PM By: Kelly Booze RN, BSN ?Entered By: 02/03/2022 on 12/04/2021 15:18:19 ?-------------------------------------------------------------------------------- ?Multi Wound Chart Details ?Patient Name: ?Date of Service: ?[name], [name] 12/04/2021 3:00 PM ?Medical Record Number: 02/03/2022 ?Patient Account Number: 02/03/2022 ?Date of Birth/Sex: ?Treating RN: ?[birth year] (26 y.o. F) [name], Kelly ?Primary Care Benelli Winther: 03/19/1996 ?Other Clinician: ?Referring Dezaree Tracey: ?Treating Macel Yearsley/Extender: 26 ?Kelly [name] ?Weeks in Treatment: 42 ?Vital Signs ?Height(in):  62 ?Pulse(bpm): 85 ?Weight(lbs): ?Blood Pressure(mmHg): 153/104 ?Body Mass Index(BMI): ?Temperature(??F): 98.2 ?Respiratory Rate(breaths/min): 20 ?Photos: ?Left, Lateral Ankle Right, Anterior Lower Leg Left, Anterior

## 2021-12-18 ENCOUNTER — Encounter (HOSPITAL_BASED_OUTPATIENT_CLINIC_OR_DEPARTMENT_OTHER): Payer: BC Managed Care – PPO | Admitting: Internal Medicine

## 2021-12-18 DIAGNOSIS — L97812 Non-pressure chronic ulcer of other part of right lower leg with fat layer exposed: Secondary | ICD-10-CM | POA: Diagnosis not present

## 2021-12-18 DIAGNOSIS — L97322 Non-pressure chronic ulcer of left ankle with fat layer exposed: Secondary | ICD-10-CM | POA: Diagnosis not present

## 2021-12-18 DIAGNOSIS — L97822 Non-pressure chronic ulcer of other part of left lower leg with fat layer exposed: Secondary | ICD-10-CM

## 2021-12-18 NOTE — Progress Notes (Signed)
EMELY, FAHY. ([id number]) ?Visit Report for 12/18/2021 ?Arrival Information Details ?Patient Name: Date of Service: ?Curet, [name] 12/18/2021 2:45 PM ?Medical Record Number: [id number] ?Patient Account Number: [id number] ?Date of Birth/Sex: Treating RN: ?01/11/1996 (25 y.o. [name], [name] ?Primary Care Jani Moronta: Dione Booze Other Clinician: ?Referring Shuan Statzer: ?Treating Jeani Fassnacht/Extender: Geralyn Corwin ?Dione Booze ?Weeks in Treatment: 44 ?Visit Information History Since Last Visit ?Added or deleted any medications: No ?Patient Arrived: Wheel Chair ?Any new allergies or adverse reactions: No ?Arrival Time: 14:58 ?Had a fall or experienced change in No ?Accompanied By: self ?activities of daily living that may affect ?Transfer Assistance: None ?risk of falls: ?Patient Identification Verified: Yes ?Signs or symptoms of abuse/neglect since last visito No ?Secondary Verification Process Completed: Yes ?Hospitalized since last visit: No ?Patient Requires Transmission-Based Precautions: No ?Implantable device outside of the clinic excluding No ?Patient Has Alerts: Yes ?cellular tissue based products placed in the center ?Patient Alerts: Left ABi=1.18 since last visit: ?Has Dressing in Place as Prescribed: Yes ?Pain Present Now: No ?Electronic Signature(s) ?Signed: 12/18/2021 4:11:34 PM By: Fonnie Mu RN ?Entered By: Fonnie Mu on 12/18/2021 15:01:22 ?-------------------------------------------------------------------------------- ?Encounter Discharge Information Details ?Patient Name: Date of Service: ?[place]deaux, [name] 12/18/2021 2:45 PM ?Medical Record Number: [id number] ?Patient Account Number: [id number] ?Date of Birth/Sex: Treating RN: ?30-Jul-1996 (25 y.o. [name], [name] ?Primary Care Trinna Kunst: Dione Booze Other Clinician: ?Referring Taralynn Quiett: ?Treating Harutyun Monteverde/Extender: Geralyn Corwin ?Dione Booze ?Weeks in Treatment: 44 ?Encounter Discharge Information  Items Post Procedure Vitals ?Discharge Condition: Stable ?Temperature (F): 98.7 ?Ambulatory Status: Ambulatory ?Pulse (bpm): 74 ?Discharge Destination: Home ?Respiratory Rate (breaths/min): 17 ?Transportation: Private Auto ?Blood Pressure (mmHg): 134/74 ?Accompanied By: self ?Schedule Follow-up Appointment: Yes ?Clinical Summary of Care: Patient Declined ?Electronic Signature(s) ?Signed: 12/18/2021 4:11:34 PM By: Fonnie Mu RN ?Entered By: Fonnie Mu on 12/18/2021 15:59:47 ?-------------------------------------------------------------------------------- ?Lower Extremity Assessment Details ?Patient Name: ?Date of Service: ?Parillo, [name] 12/18/2021 2:45 PM ?Medical Record Number: [id number] ?Patient Account Number: [id number] ?Date of Birth/Sex: ?Treating RN: ?[birth year] (25 y.o. [name], [name] ?Primary Care Makala Fetterolf: Dione Booze ?Other Clinician: ?Referring Magen Suriano: ?Treating Kaydenn Mclear/Extender: Geralyn Corwin ?Dione Booze ?Weeks in Treatment: 44 ?Edema Assessment ?Assessed: [Left: Yes] [Right: Yes] ?Edema: [Left: Yes] [Right: Yes] ?Calf ?Left: Right: ?Point of Measurement: 27 cm From Medial Instep 34 cm 33 cm ?Ankle ?Left: Right: ?Point of Measurement: 8 cm From Medial Instep 22 cm 22 cm ?Electronic Signature(s) ?Signed: 12/18/2021 4:11:34 PM By: Fonnie Mu RN ?Entered By: Fonnie Mu on 12/18/2021 15:05:12 ?-------------------------------------------------------------------------------- ?Multi Wound Chart Details ?Patient Name: ?Date of Service: ?Flanigan, [name] 12/18/2021 2:45 PM ?Medical Record Number: [id number] ?Patient Account Number: [id number] ?Date of Birth/Sex: ?Treating RN: ?1996-03-24 (25 y.o. [name], [name] ?Primary Care Bonifacio Pruden: Dione Booze ?Other Clinician: ?Referring Tyreek Clabo: ?Treating Yatziri Wainwright/Extender: Geralyn Corwin ?Dione Booze ?Weeks in Treatment: 44 ?Vital Signs ?Height(in): 62 ?Pulse(bpm): 89 ?Weight(lbs): ?Blood  Pressure(mmHg): 129/93 ?Body Mass Index(BMI): ?Temperature(??F): 99.7 ?Respiratory Rate(breaths/min): 17 ?Photos: ?Right, Medial Lower Leg Left, Lateral Ankle Right, Anterior Lower Leg ?Wound Location: ?Gradually Appeared Gradually Appeared Gradually Appeared ?Wounding Event: ?Lupus Cellulitis Lupus ?Primary Etiology: ?N/A Lupus N/A ?Secondary Etiology: ?Asthma, Lupus Erythematosus Asthma, Lupus Erythematosus Asthma, Lupus Erythematosus ?Comorbid History: ?12/18/2021 04/14/2021 08/28/2021 ?Date Acquired: ?0 34 15 ?Weeks of Treatment: ?Open Open Open ?Wound Status: ?No No No ?Wound Recurrence: ?Yes No No ?Clustered Wound: ?4.5x3.5x0.3 0.7x0.5x0.4 0.5x0.3x0.5 ?Measurements L x W x D (cm) ?12.37 0.275 0.118 ?A (cm?) : ?rea ?3.711 0.11 0.059 ?Volume (cm?) : ?0.00% 98.20% 76.90% ?%  Reduction in Area: ?0.00% 98.20% -15.70% ?% Reduction in Volume: ?Full Thickness With Exposed Support Full Thickness Without Exposed Full Thickness Without Exposed ?Classification: ?Structures Support Structures Support Structures ?Medium Medium Medium ?Exudate A mount: ?Serosanguineous Serosanguineous Serous ?Exudate Type: ?red, brown red, brown amber ?Exudate Color: ?No Yes No ?Foul Odor A Cleansing: ?fter ?N/A No N/A ?Odor Anticipated Due to Product ?Use: ?Distinct, outline attached Distinct, outline attached Epibole ?Wound Margin: ?None Present (0%) Large (67-100%) Large (67-100%) ?Granulation A mount: ?N/A Pink, Pale Pink, Pale ?Granulation Quality: ?Large (67-100%) Small (1-33%) Small (1-33%) ?Necrotic Amount: ?Eschar, [facility] [place] ?Necrotic Tissue: ?Fat Layer (Subcutaneous Tissue): Yes Fat Layer (Subcutaneous Tissue): Yes Fat Layer (Subcutaneous Tissue): Yes ?Exposed Structures: ?Fascia: No ?Fascia: No ?Fascia: No ?Tendon: No ?Tendon: No ?Tendon: No ?Muscle: No ?Muscle: No ?Muscle: No ?Joint: No ?Joint: No ?Joint: No ?Bone: No ?Bone: No ?Bone: No ?None Small (1-33%) None ?Epithelialization: ?Debridement -  Excisional Debridement - Excisional Debridement - Excisional ?Debridement: ?Pre-procedure Verification/Time Out 15:36 15:36 15:36 ?Taken: ?Lidocaine Lidocaine Lidocaine ?Pain Control: ?Subcutaneous, Slough Subcutaneous, [employer], [place] ?Tissue Debrided: ?Skin/Subcutaneous Tissue Skin/Subcutaneous Tissue Skin/Subcutaneous Tissue ?Level: ?4 0.35 0.15 ?Debridement A (sq cm): ?rea ?Curette Curette Curette ?Instrument: ?Minimum Minimum Minimum ?Bleeding: ?Pressure Pressure Pressure ?Hemostasis A chieved: ?0 0 0 ?Procedural Pain: ?0 0 0 ?Post Procedural Pain: ?Procedure was tolerated well Procedure was tolerated well Procedure was tolerated well ?Debridement Treatment Response: ?4.5x3.5x0.3 0.7x0.5x0.4 0.5x0.3x0.5 ?Post Debridement Measurements L x ?W x D (cm) ?3.711 0.11 0.059 ?Post Debridement Volume: (cm?) ?Debridement Debridement Debridement ?Procedures Performed: ?Treatment Notes ?Wound #10 (Lower Leg) Wound Laterality: Right, Medial ?Cleanser ?Peri-Wound Care ?Topical ?Primary Dressing ?Secondary Dressing ?Secured With ?Compression Wrap ?Compression Stockings ?Add-Ons ?Wound #4 (Ankle) Wound Laterality: Left, Lateral ?Cleanser ?Soap and Water ?Discharge Instruction: May shower and wash wound with dial antibacterial soap and water prior to dressing change. ?Peri-Wound Care ?Topical ?Primary Dressing ?Promogran Prisma Matrix, 4.34 (sq in) (silver collagen) ?Discharge Instruction: Moisten collagen with saline or hydrogel ?Secondary Dressing ?Zetuvit Plus Silicone Border Dressing 4x4 (in/in) ?Discharge Instruction: Apply silicone border over primary dressing as directed. ?Secured With ?Compression Wrap ?Compression Stockings ?Add-Ons ?Wound #8 (Lower Leg) Wound Laterality: Right, Anterior ?Cleanser ?Soap and Water ?Discharge Instruction: May shower and wash wound with dial antibacterial soap and water prior to dressing change. ?Peri-Wound Care ?Topical ?Primary Dressing ?Promogran Prisma Matrix, 4.34 (sq in)  (silver collagen) ?Discharge Instruction: Moisten collagen with saline or hydrogel ?Secondary Dressing ?Zetuvit Plus Silicone Border Dressing 4x4 (in/in) ?Discharge Instruction: Apply silicone border over pri

## 2021-12-19 NOTE — Progress Notes (Signed)
WILLADEEN, COLANTUONO. ([id number]) ?Visit Report for 12/18/2021 ?Chief Complaint Document Details ?Patient Name: Date of Service: ?[name], [name] 12/18/2021 2:45 PM ?Medical Record Number: [id number] ?Patient Account Number: [id number] ?Date of Birth/Sex: Treating RN: ?01-23-1996 (25 y.o. Kelly [name] ?Primary Care Provider: Dione Booze Other Clinician: ?Referring Provider: ?Treating Provider/Extender: Geralyn Corwin ?Dione Booze ?Weeks in Treatment: 44 ?Information Obtained from: Patient ?Chief Complaint ?Bilateral lower extremity wounds ?04/21/21: left lower extremity wound s/p cellulitis ?09/01/21; right lower extremity wound ?Electronic Signature(s) ?Signed: 12/18/2021 4:28:25 PM By: Geralyn Corwin DO ?Entered By: Geralyn Corwin on 12/18/2021 16:07:11 ?-------------------------------------------------------------------------------- ?Debridement Details ?Patient Name: Date of Service: ?[name], [name] 12/18/2021 2:45 PM ?Medical Record Number: [id number] ?Patient Account Number: [id number] ?Date of Birth/Sex: Treating RN: ?July 19, 1996 (25 y.o. Kelly [name] ?Primary Care Provider: Dione Booze Other Clinician: ?Referring Provider: ?Treating Provider/Extender: Geralyn Corwin ?Dione Booze ?Weeks in Treatment: 44 ?Debridement Performed for Assessment: Wound #4 Left,Lateral Ankle ?Performed By: Physician Geralyn Corwin, DO ?Debridement Type: Debridement ?Level of Consciousness (Pre-procedure): Awake and Alert ?Pre-procedure Verification/Time Out Yes - 15:36 ?Taken: ?Start Time: 15:36 ?Pain Control: Lidocaine ?T Area Debrided (L x W): ?otal 0.7 (cm) x 0.5 (cm) = 0.35 (cm?) ?Tissue and other material debrided: Viable, Non-Viable, Slough, Subcutaneous, Slough ?Level: Skin/Subcutaneous Tissue ?Debridement Description: Excisional ?Instrument: Curette ?Bleeding: Minimum ?Hemostasis Achieved: Pressure ?End Time: 15:36 ?Procedural Pain: 0 ?Post Procedural Pain: 0 ?Response to  Treatment: Procedure was tolerated well ?Level of Consciousness (Post- Awake and Alert ?procedure): ?Post Debridement Measurements of Total Wound ?Length: (cm) 0.7 ?Width: (cm) 0.5 ?Depth: (cm) 0.4 ?Volume: (cm?) 0.11 ?Character of Wound/Ulcer Post Debridement: Improved ?Post Procedure Diagnosis ?Same as Pre-procedure ?Electronic Signature(s) ?Signed: 12/18/2021 4:11:34 PM By: Fonnie Mu RN ?Signed: 12/18/2021 4:28:25 PM By: Geralyn Corwin DO ?Entered By: Fonnie Mu on 12/18/2021 15:37:16 ?-------------------------------------------------------------------------------- ?Debridement Details ?Patient Name: ?Date of Service: ?[name], [name] 12/18/2021 2:45 PM ?Medical Record Number: [id number] ?Patient Account Number: [id number] ?Date of Birth/Sex: ?Treating RN: ?[birth year] (25 y.o. Kelly [name] ?Primary Care Provider: Dione Booze ?Other Clinician: ?Referring Provider: ?Treating Provider/Extender: Geralyn Corwin ?Dione Booze ?Weeks in Treatment: 44 ?Debridement Performed for Assessment: Wound #8 Right,Anterior Lower Leg ?Performed By: Physician Geralyn Corwin, DO ?Debridement Type: Debridement ?Level of Consciousness (Pre-procedure): Awake and Alert ?Pre-procedure Verification/Time Out Yes - 15:36 ?Taken: ?Start Time: 15:36 ?Pain Control: Lidocaine ?T Area Debrided (L x W): ?otal 0.5 (cm) x 0.3 (cm) = 0.15 (cm?) ?Tissue and other material debrided: Viable, Non-Viable, Slough, Subcutaneous, Slough ?Level: Skin/Subcutaneous Tissue ?Debridement Description: Excisional ?Instrument: Curette ?Bleeding: Minimum ?Hemostasis Achieved: Pressure ?End Time: 15:36 ?Procedural Pain: 0 ?Post Procedural Pain: 0 ?Response to Treatment: Procedure was tolerated well ?Level of Consciousness (Post- Awake and Alert ?procedure): ?Post Debridement Measurements of Total Wound ?Length: (cm) 0.5 ?Width: (cm) 0.3 ?Depth: (cm) 0.5 ?Volume: (cm?) 0.059 ?Character of Wound/Ulcer Post Debridement:  Improved ?Post Procedure Diagnosis ?Same as Pre-procedure ?Electronic Signature(s) ?Signed: 12/18/2021 4:11:34 PM By: Fonnie Mu RN ?Signed: 12/18/2021 4:28:25 PM By: Geralyn Corwin DO ?Entered By: Fonnie Mu on 12/18/2021 15:38:16 ?-------------------------------------------------------------------------------- ?Debridement Details ?Patient Name: ?Date of Service: ?[name], [name] 12/18/2021 2:45 PM ?Medical Record Number: [id number] ?Patient Account Number: [id number] ?Date of Birth/Sex: Treating RN: ?1996/01/13 (25 y.o. Kelly [name] ?Primary Care Provider: Dione Booze Other Clinician: ?Referring Provider: ?Treating Provider/Extender: Geralyn Corwin ?Dione Booze ?Weeks in Treatment: 44 ?Debridement Performed for Assessment: Wound #10 Right,Medial Lower Leg ?Performed By: Physician Geralyn Corwin, DO ?Debridement Type: Debridement ?Level of Consciousness (Pre-procedure): Awake  and Alert ?Pre-procedure Verification/Time Out Yes - 15:36 ?Taken: ?Start Time: 15:36 ?Pain Control: Lidocaine ?T Area Debrided (L x W): ?otal 2 (cm) x 2 (cm) = 4 (cm?) ?Tissue and other material debrided: Viable, Non-Viable, Slough, Subcutaneous, Slough ?Level: Skin/Subcutaneous Tissue ?Debridement Description: Excisional ?Instrument: Curette ?Bleeding: Minimum ?Hemostasis Achieved: Pressure ?End Time: 15:36 ?Procedural Pain: 0 ?Post Procedural Pain: 0 ?Response to Treatment: Procedure was tolerated well ?Level of Consciousness (Post- Awake and Alert ?procedure): ?Post Debridement Measurements of Total Wound ?Length: (cm) 4.5 ?Width: (cm) 3.5 ?Depth: (cm) 0.3 ?Volume: (cm?) 3.711 ?Character of Wound/Ulcer Post Debridement: Improved ?Post Procedure Diagnosis ?Same as Pre-procedure ?Electronic Signature(s) ?Signed: 12/18/2021 4:11:34 PM By: Fonnie Mu RN ?Signed: 12/18/2021 4:28:25 PM By: Geralyn Corwin DO ?Entered By: Fonnie Mu on 12/18/2021  15:39:23 ?-------------------------------------------------------------------------------- ?HPI Details ?Patient Name: Date of Service: ?[name], [name] 12/18/2021 2:45 PM ?Medical Record Number: [id number] ?Patient Account Number: [id number] ?Date of Birth/Sex: Treating RN: ?[birth year] (25 y.o. Kelly [name] ?Primary Care Provider: Dione Booze Other Clinician: ?Referring Provider: ?Treating Provider/Extender: Geralyn Corwin ?Dione Booze ?Weeks in Treatment: 44 ?History of Present Illness ?HPI Description: Admission 6/21 ?Ms. Aelyn Stanaland is A 26 year old female with a past medical history of systemic lupus erythematosus that presents with bilateral lower extremity wounds ?that started in April 2022. She has had skin issues in the past where she developed very small wounds but these healed with time. She has never had open ?wounds like she does on her legs. She currently reports minimal drainage to the wounds. She does have pain to these areas but has been overall stable. She ?denies signs of infection. She has visited the ED for this issue and was recently prescribed doxycycline for possible cellulitis. She follows with [facility] ?rheumatology for her SLE and is currently on Plaquenil, prednisone and CellCept. ?6/27; patient presents for 1 week follow-up. She states she saw her rheumatologist on 6/22 and prednisone was increased from 10 mg to 20 mg daily. She is ?currently at the highest dose of Plaquenil and CellCept and is going to try an infusion later today at her regimen. She reports improvement to the wounds. She ?has been using collagen with dressing changes. She currently denies signs of infection. ?Readmission 8/26 ?Patient presents to clinic today for open wounds to her left lower extremity. Her previous wounds that were treated have closed. She states that on 8/9 she ?developed cellulitis and was treated with 2 rounds of antibiotics. Her cellulitis has resolved. She had blisters that  opened and are now large wounds. She has ?seen her dermatologist and rheumatologist since she has systemic lupus and a hard time healing. No changes have been made to her medications. She was ?recommended antibiotic ointment by her dermatologist to her wounds She pi

## 2022-01-02 ENCOUNTER — Encounter (HOSPITAL_BASED_OUTPATIENT_CLINIC_OR_DEPARTMENT_OTHER): Payer: BC Managed Care – PPO | Attending: Internal Medicine | Admitting: Internal Medicine

## 2022-01-02 DIAGNOSIS — Z79899 Other long term (current) drug therapy: Secondary | ICD-10-CM | POA: Insufficient documentation

## 2022-01-02 DIAGNOSIS — L97822 Non-pressure chronic ulcer of other part of left lower leg with fat layer exposed: Secondary | ICD-10-CM | POA: Diagnosis present

## 2022-01-02 DIAGNOSIS — L97812 Non-pressure chronic ulcer of other part of right lower leg with fat layer exposed: Secondary | ICD-10-CM | POA: Insufficient documentation

## 2022-01-02 DIAGNOSIS — M329 Systemic lupus erythematosus, unspecified: Secondary | ICD-10-CM | POA: Insufficient documentation

## 2022-01-02 NOTE — Progress Notes (Signed)
Zettie PhoEWKIRK, Crimson .. ([id number]) ?Visit Report for 01/02/2022 ?Chief Complaint Document Details ?Patient Name: Date of Service: ?Prezioso, [name] 01/02/2022 1:00 PM ?Medical Record Number: [id number] ?Patient Account Number: [id number] ?Date of Birth/Sex: Treating RN: ?Jul 27, 1996 (25 y.o. Ardis RowanF) Breedlove, [name] ?Primary Care Provider: Dione BoozePartridge, James Other Clinician: ?Referring Provider: ?Treating Provider/Extender: Geralyn CorwinHoffman, Jerad Dunlap ?Dione BoozePartridge, James ?Weeks in Treatment: 46 ?Information Obtained from: Patient ?Chief Complaint ?Bilateral lower extremity wounds ?04/21/21: left lower extremity wound s/p cellulitis ?09/01/21; right lower extremity wound ?Electronic Signature(s) ?Signed: 01/02/2022 3:11:58 PM By: Geralyn CorwinHoffman, Nalaysia Manganiello DO ?Entered By: Geralyn CorwinHoffman, Forbes Loll on 01/02/2022 14:47:42 ?-------------------------------------------------------------------------------- ?HPI Details ?Patient Name: Date of Service: ?[name], [name] 01/02/2022 1:00 PM ?Medical Record Number: [id number] ?Patient Account Number: [id number] ?Date of Birth/Sex: Treating RN: ?Jul 27, 1996 (25 y.o. Ardis RowanF) Breedlove, [name] ?Primary Care Provider: Dione BoozePartridge, James Other Clinician: ?Referring Provider: ?Treating Provider/Extender: Geralyn CorwinHoffman, Alila Sotero ?Dione BoozePartridge, James ?Weeks in Treatment: 46 ?History of Present Illness ?HPI Description: Admission 6/21 ?Kelly [name] is A 26 year old female with a past medical history of systemic lupus erythematosus that presents with bilateral lower extremity wounds ?that started in April 2022. She has had skin issues in the past where she developed very small wounds but these healed with time. She has never had open ?wounds like she does on her legs. She currently reports minimal drainage to the wounds. She does have pain to these areas but has been overall stable. She ?denies signs of infection. She has visited the ED for this issue and was recently prescribed doxycycline for possible cellulitis. She follows with  [facility] ?rheumatology for her SLE and is currently on Plaquenil, prednisone and CellCept. ?6/27; patient presents for 1 week follow-up. She states she saw her rheumatologist on 6/22 and prednisone was increased from 10 mg to 20 mg daily. She is ?currently at the highest dose of Plaquenil and CellCept and is going to try an infusion later today at her regimen. She reports improvement to the wounds. She ?has been using collagen with dressing changes. She currently denies signs of infection. ?Readmission 8/26 ?Patient presents to clinic today for open wounds to her left lower extremity. Her previous wounds that were treated have closed. She states that on 8/9 she ?developed cellulitis and was treated with 2 rounds of antibiotics. Her cellulitis has resolved. She had blisters that opened and are now large wounds. She has ?seen her dermatologist and rheumatologist since she has systemic lupus and a hard time healing. No changes have been made to her medications. She was ?recommended antibiotic ointment by her dermatologist to her wounds She picked up the antibiotic ointment today.. She reports pain to these areas. She denies ?purulent drainage, increased warmth or erythema to the left lower extremity. She currently keeps the areas covered. ?Of note she had an Achilles tendon rupture on her right leg and is currently in a soft cast. ?9/13; patient presents for follow-up. She missed her last clinic appointment. She has been using Santyl to the wound beds daily. She has no issues or ?complaints today. She reports the Santyl is helping clean up the wounds. She currently denies signs of infection. ?9/22; patient states that 1 week ago she was seen by dermatology and they placed an Unna boot with Bactroban on her left lower extremity. Today she ?presents with a Unna boot in place. She currently denies signs of infection. ?10/7; patient presents for follow-up. She reports improvement. She has been using Santyl to the areas of  nonviable tissue and collagen to granulation tissue. ?She  has been approved for infusions for her lupus. She is going to start these soon. These will be injection she can do at home. She currently denies signs of ?infection. ?10/21; patient presents for follow-up. She reports continued improvement to her wound healing. She has been using Santyl to all areas except for the superior ?posterior left leg wound and she is using collagen to this. She has started her injection therapy for lupus. She started 1 week ago. She currently denies signs of ?infection. ?11/3; patient presents for follow-up. She has no issues or complaints today. She is on her third week of injection therapy for lupus and tolerating this well. She ?has no issues or complaints today. She denies signs of infection. ?11/17; patient presents for 2-week follow-up. She reports improvement in wound healing. She denies signs of infection. ?12/1; patient presents for follow-up. She has been using collagen to the anterior left lower extremity wounds and Santyl to the left lateral malleolus. She has no ?issues or complaints today. ?12/15; patient presents for follow-up. She is using collagen to the 2 anterior left leg wounds and Santyl to the left lateral malleolus wound. She reports ?improvement in wound healing. She denies signs of infection. ?12/22; patient presents because she is having purulent drainage from her anterior left leg wound. She reports no inciting event and states that the area started ?draining spontaneously. She was hoping that the drainage would stop however this has not improved. She states this started 5 days ago. She reports ?tenderness to the wound bed. ?12/29; patient presents for follow-up. She was able to take Augmentin for 4 days but did not tolerate the GI side effects. She continues to take doxycycline. ?She reports improvement in drainage. She no longer has increased warmth to the surrounding wound bed. She ran out of [place] and  has been using Vaseline to ?the wound beds. ?1/6; Patient presents for follow-up. She has been taking Keflex without issues. She reports improvement in drainage and wound healing T the left lower ?o ?extremity. Unfortunately she developed another wound spontaneously to the right anterior lower extremity. She denies systemic signs of infection. ?1/16; patient presents for follow-up. Patient completed her first prescription of Keflex and has not started the second. She reports no more drainage to the ?wound beds. She states there was an issue with insurance however she is going to be able to obtain Santyl soon. She has been using Dakin's wet-to-dry ?dressings to the right anterior leg and left lateral ankle. She has been using collagen to the distal anterior left leg wound and reports that the proximal anterior ?left leg wound has healed. She denies systemic signs of infection. ?1/31; patient presents for follow-up. She reports improvement to the left leg wounds. She reports increased inflammation to the right leg wound. She thinks she ?is having a reaction to the foam border dressing on the right leg. She is unable to obtain Santyl. She has been using Dakin's wet-to-dry dressings to the right ?anterior leg and left lateral ankle. She has been using collagen to the distal anterior left leg wound. ?2/13; patient presents for follow-up. She reports improvement in her wound healing. One of the wounds to the anterior aspect of the left lower leg has slightly ?reopened. She denies signs of infection. ?2/27; patient presents for follow-up. She has no issues or complaints today. She denies signs of infection. ?3/20; patient presents for follow-up. She has been using Medihoney to the right lower extremity wound and left lateral ankle wound. She continues  to use ?collagen to the left anterior leg wound. She has no issues or complaints today. She denies signs of infection. She reports improvement in wound ?4/10; patient has  been using Medihoney to the right anterior lower leg wound and silver collagen on the left including the left ankle and left anterior. Fortunately ?the left anterior lower leg wound is healed. Both of the areas on th

## 2022-01-02 NOTE — Progress Notes (Signed)
Kelly, [id number] ([id number]) ?Visit Report for 01/02/2022 ?Arrival Information Details ?Patient Name: Date of Service: ?Kelly [name]. 01/02/2022 1:00 PM ?Medical Record Number: [id number] ?Patient Account Number: [id number] ?Date of Birth/Sex: Treating RN: ?Jun 14, 1996 (26 y.o. Kelly [name], Kelly ?Primary Care Kelly [name]: Kelly [name] Other Clinician: ?Referring Kelly [name]: ?Treating Kelly [name]/Extender: Kalman Shan ?Kelly [name] ?Weeks in Treatment: 46 ?Visit Information History Since Last Visit ?All ordered tests and consults were completed: No ?Patient Arrived: Ambulatory ?Added or deleted any medications: No ?Arrival Time: 13:27 ?Any new allergies or adverse reactions: No ?Accompanied By: self ?Had a fall or experienced change in No ?Transfer Assistance: None ?activities of daily living that may affect ?Patient Identification Verified: Yes ?risk of falls: ?Secondary Verification Process Completed: Yes ?Signs or symptoms of abuse/neglect since last visito No ?Patient Requires Transmission-Based Precautions: No ?Hospitalized since last visit: No ?Patient Has Alerts: Yes ?Implantable device outside of the clinic excluding No ?Patient Alerts: Left ABi=1.18 cellular tissue based products placed in the center ?since last visit: ?Has Dressing in Place as Prescribed: Yes ?Pain Present Now: No ?Electronic Signature(s) ?Signed: 01/02/2022 5:16:05 PM By: Rhae Hammock RN ?Entered By: Rhae Hammock on 01/02/2022 13:27:28 ?-------------------------------------------------------------------------------- ?Clinic Level of Care Assessment Details ?Patient Name: Date of Service: ?Kelly [name]. 01/02/2022 1:00 PM ?Medical Record Number: [id number] ?Patient Account Number: [id number] ?Date of Birth/Sex: Treating RN: ?02/05/1996 (26 y.o. Kelly [name], Kelly ?Primary Care Kelly [name]: Kelly [name] Other Clinician: ?Referring Kelly [name]: ?Treating Kelly [name]/Extender: Kalman Shan ?Kelly [name] ?Weeks in  Treatment: 46 ?Clinic Level of Care Assessment Items ?TOOL 4 Quantity Score ?X- 1 0 ?Use when only an EandM is performed on FOLLOW-UP visit ?ASSESSMENTS - Nursing Assessment / Reassessment ?X- 1 10 ?Reassessment of Co-morbidities (includes updates in patient status) ?X- 1 5 ?Reassessment of Adherence to Treatment Plan ?ASSESSMENTS - Wound and Skin A ssessment / Reassessment ?[]  - 0 ?Simple Wound Assessment / Reassessment - one wound ?X- 3 5 ?Complex Wound Assessment / Reassessment - multiple wounds ?[]  - 0 ?Dermatologic / Skin Assessment (not related to wound area) ?ASSESSMENTS - Focused Assessment ?X- 2 5 ?Circumferential Edema Measurements - multi extremities ?[]  - 0 ?Nutritional Assessment / Counseling / Intervention ?[]  - 0 ?Lower Extremity Assessment (monofilament, tuning fork, pulses) ?[]  - 0 ?Peripheral Arterial Disease Assessment (using hand held doppler) ?ASSESSMENTS - Ostomy and/or Continence Assessment and Care ?[]  - 0 ?Incontinence Assessment and Management ?[]  - 0 ?Ostomy Care Assessment and Management (repouching, etc.) ?PROCESS - Coordination of Care ?[]  - 0 ?Simple Patient / Family Education for ongoing care ?X- 1 20 ?Complex (extensive) Patient / Family Education for ongoing care ?X- 1 10 ?Staff obtains Consents, Records, T Results / Process Orders ?est ?[]  - 0 ?Staff telephones HHA, Nursing Homes / Clarify orders / etc ?[]  - 0 ?Routine Transfer to another Facility (non-emergent condition) ?[]  - 0 ?Routine Hospital Admission (non-emergent condition) ?[]  - 0 ?New Admissions / Biomedical engineer / Ordering NPWT Apligraf, etc. ?, ?[]  - 0 ?Emergency Hospital Admission (emergent condition) ?X- 1 10 ?Simple Discharge Coordination ?[]  - 0 ?Complex (extensive) Discharge Coordination ?PROCESS - Special Needs ?[]  - 0 ?Pediatric / Minor Patient Management ?[]  - 0 ?Isolation Patient Management ?[]  - 0 ?Hearing / Language / Visual special needs ?[]  - 0 ?Assessment of Community assistance (transportation,  D/C planning, etc.) ?[]  - 0 ?Additional assistance / Altered mentation ?[]  - 0 ?Support Surface(s) Assessment (bed, cushion, seat, etc.) ?INTERVENTIONS - Wound Cleansing / Measurement ?[]  - 0 ?Simple Wound Cleansing - one  wound ?X- 3 5 ?Complex Wound Cleansing - multiple wounds ?X- 1 5 ?Wound Imaging (photographs - any number of wounds) ?[]  - 0 ?Wound Tracing (instead of photographs) ?[]  - 0 ?Simple Wound Measurement - one wound ?X- 3 5 ?Complex Wound Measurement - multiple wounds ?INTERVENTIONS - Wound Dressings ?X - Small Wound Dressing one or multiple wounds 3 10 ?[]  - 0 ?Medium Wound Dressing one or multiple wounds ?[]  - 0 ?Large Wound Dressing one or multiple wounds ?X- 1 5 ?Application of Medications - topical ?[]  - 0 ?Application of Medications - injection ?INTERVENTIONS - Miscellaneous ?[]  - 0 ?External ear exam ?[]  - 0 ?Specimen Collection (cultures, biopsies, blood, body fluids, etc.) ?[]  - 0 ?Specimen(s) / Culture(s) sent or taken to Lab for analysis ?[]  - 0 ?Patient Transfer (multiple staff / Civil Service fast streamer / Similar devices) ?[]  - 0 ?Simple Staple / Suture removal (25 or less) ?[]  - 0 ?Complex Staple / Suture removal (26 or more) ?[]  - 0 ?Hypo / Hyperglycemic Management (close monitor of Blood Glucose) ?[]  - 0 ?Ankle / Brachial Index (ABI) - do not check if billed separately ?X- 1 5 ?Vital Signs ?Has the patient been seen at the hospital within the last three years: Yes ?Total Score: 155 ?Level Of Care: New/Established - Level 4 ?Electronic Signature(s) ?Signed: 01/02/2022 5:16:05 PM By: Rhae Hammock RN ?Entered By: Rhae Hammock on 01/02/2022 14:19:44 ?-------------------------------------------------------------------------------- ?Encounter Discharge Information Details ?Patient Name: Date of Service: ?Kelly [name]. 01/02/2022 1:00 PM ?Medical Record Number: [id number] ?Patient Account Number: [id number] ?Date of Birth/Sex: Treating RN: ?Jul 21, 1996 (26 y.o. Kelly [name], Kelly ?Primary Care  Shaynah Hund: Kelly [name] Other Clinician: ?Referring Terrill Wauters: ?Treating Jessikah Dicker/Extender: Kalman Shan ?Kelly [name] ?Weeks in Treatment: 46 ?Encounter Discharge Information Items ?Discharge Condition: Stable ?Ambulatory Status: Ambulatory ?Discharge Destination: Home ?Transportation: Private Auto ?Accompanied By: self ?Schedule Follow-up Appointment: Yes ?Clinical Summary of Care: Patient Declined ?Electronic Signature(s) ?Signed: 01/02/2022 5:16:05 PM By: Rhae Hammock RN ?Entered By: Rhae Hammock on 01/02/2022 14:22:10 ?-------------------------------------------------------------------------------- ?Lower Extremity Assessment Details ?Patient Name: Date of Service: ?Kelly [name]. 01/02/2022 1:00 PM ?Medical Record Number: [id number] ?Patient Account Number: [id number] ?Date of Birth/Sex: Treating RN: ?19-Nov-1995 (26 y.o. Kelly [name], Kelly ?Primary Care Marlee Trentman: Kelly [name] Other Clinician: ?Referring Dartanyon Frankowski: ?Treating Jehad Bisono/Extender: Kalman Shan ?Kelly [name] ?Weeks in Treatment: 46 ?Edema Assessment ?Assessed: [Left: Yes] [Right: Yes] ?Edema: [Left: Yes] [Right: Yes] ?Calf ?Left: Right: ?Point of Measurement: 27 cm From Medial Instep 34.5 cm 33 cm ?Ankle ?Left: Right: ?Point of Measurement: 8 cm From Medial Instep 21.5 cm 22 cm ?Vascular Assessment ?Pulses: ?Dorsalis Pedis ?Palpable: [Left:Yes] [Right:Yes] ?Posterior Tibial ?Palpable: [Left:Yes] [Right:Yes] ?Electronic Signature(s) ?Signed: 01/02/2022 5:16:05 PM By: Rhae Hammock RN ?Entered By: Rhae Hammock on 01/02/2022 13:34:12 ?-------------------------------------------------------------------------------- ?Multi Wound Chart Details ?Patient Name: ?Date of Service: ?Kelly [name]. 01/02/2022 1:00 PM ?Medical Record Number: [id number] ?Patient Account Number: [id number] ?Date of Birth/Sex: ?Treating RN: ?May 06, 1996 (26 y.o. Kelly [name], Kelly ?Primary Care Izumi Mixon: Kelly [name] ?Other  Clinician: ?Referring Jossalyn Forgione: ?Treating Sarahbeth Cashin/Extender: Kalman Shan ?Kelly [name] ?Weeks in Treatment: 46 ?Vital Signs ?Height(in): 62 ?Pulse(bpm): 73 ?Weight(lbs): ?Blood Pressure(mmHg): 145/99 ?Lesly Rubenstein

## 2022-01-16 ENCOUNTER — Encounter (HOSPITAL_BASED_OUTPATIENT_CLINIC_OR_DEPARTMENT_OTHER): Payer: BC Managed Care – PPO | Admitting: Internal Medicine

## 2022-01-16 DIAGNOSIS — L97822 Non-pressure chronic ulcer of other part of left lower leg with fat layer exposed: Secondary | ICD-10-CM | POA: Diagnosis not present

## 2022-01-16 DIAGNOSIS — L97812 Non-pressure chronic ulcer of other part of right lower leg with fat layer exposed: Secondary | ICD-10-CM | POA: Diagnosis not present

## 2022-01-16 NOTE — Progress Notes (Signed)
Kelly [name] ([id number]) Visit Report for 01/16/2022 Arrival Information Details Patient Name: Date of Service: Kelly [name] 01/16/2022 2:30 PM Medical Record Number: [id number] Patient Account Number: [id number] Date of Birth/Sex: Treating RN: 03-Nov-1995 (26 y.o. [name], [name] Primary Care Georgiann Neider: [name] Other Clinician: Referring Dvaughn Fickle: Treating Nicki Furlan/Extender: Elsworth Soho in Treatment: 62 Visit Information History Since Last Visit Added or deleted any medications: No Patient Arrived: Ambulatory Any new allergies or adverse reactions: No Arrival Time: 14:34 Had a fall or experienced change in No Accompanied By: self activities of daily living that may affect Transfer Assistance: None risk of falls: Patient Identification Verified: Yes Signs or symptoms of abuse/neglect since last visito No Secondary Verification Process Completed: Yes Hospitalized since last visit: No Patient Requires Transmission-Based Precautions: No Implantable device outside of the clinic excluding No Patient Has Alerts: Yes cellular tissue based products placed in the center Patient Alerts: Left ABi=1.18 since last visit: Has Dressing in Place as Prescribed: Yes Pain Present Now: No Electronic Signature(s) Signed: 01/16/2022 4:06:07 PM By: Rhae Hammock RN Entered By: Rhae Hammock on 01/16/2022 14:34:32 -------------------------------------------------------------------------------- Encounter Discharge Information Details Patient Name: Date of Service: Kelly Lou [name] 01/16/2022 2:30 PM Medical Record Number: [id number] Patient Account Number: [id number] Date of Birth/Sex: Treating RN: [birth year] (26 y.o. [name], [name] Primary Care Camila Maita: [name] Other Clinician: Referring Coral Timme: Treating Anuradha Chabot/Extender: Elsworth Soho in Treatment: 48 Encounter Discharge Information  Items Post Procedure Vitals Discharge Condition: Stable Temperature (F): 98.7 Ambulatory Status: Ambulatory Pulse (bpm): 74 Discharge Destination: Home Respiratory Rate (breaths/min): 17 Transportation: Private Auto Blood Pressure (mmHg): 134/74 Accompanied By: self Schedule Follow-up Appointment: Yes Clinical Summary of Care: Patient Declined Electronic Signature(s) Signed: 01/16/2022 4:06:07 PM By: Rhae Hammock RN Entered By: Rhae Hammock on 01/16/2022 15:08:52 -------------------------------------------------------------------------------- Lower Extremity Assessment Details Patient Name: Date of Service: Kelly Lou [name] 01/16/2022 2:30 PM Medical Record Number: [id number] Patient Account Number: [id number] Date of Birth/Sex: Treating RN: 02-07-1996 (26 y.o. [name], [name] Primary Care Meggie Laseter: [name] Other Clinician: Referring Jovin Fester: Treating Ameli Sangiovanni/Extender: Elsworth Soho in Treatment: 48 Edema Assessment Assessed: Shirlyn Goltz: Yes] Patrice Paradise: Yes] Edema: [Left: Yes] [Right: Yes] Calf Left: Right: Point of Measurement: 27 cm From Medial Instep 34.5 cm 33 cm Ankle Left: Right: Point of Measurement: 8 cm From Medial Instep 21.5 cm 22 cm Vascular Assessment Pulses: Dorsalis Pedis Palpable: [Left:Yes] [Right:Yes] Posterior Tibial Palpable: [Left:Yes] [Right:Yes] Electronic Signature(s) Signed: 01/16/2022 4:06:07 PM By: Rhae Hammock RN Entered By: Rhae Hammock on 01/16/2022 14:39:10 -------------------------------------------------------------------------------- Multi Wound Chart Details Patient Name: Date of Service: Kelly Lou [name] 01/16/2022 2:30 PM Medical Record Number: [id number] Patient Account Number: [id number] Date of Birth/Sex: Treating RN: Jan 18, 1996 (26 y.o. [name], [name] Primary Care Nakiea Metzner: [name] Other Clinician: Referring Colbi Schiltz: Treating Nalea Salce/Extender:  Elsworth Soho in Treatment: 48 Vital Signs Height(in): 20 Pulse(bpm): 103 Weight(lbs): Blood Pressure(mmHg): 133/96 Body Mass Index(BMI): Temperature(F): 98.9 Respiratory Rate(breaths/min): 17 Photos: [10:Right, Medial Lower Leg] [11:Right, Proximal, Medial Lower Leg] [4:Left, Lateral Ankle] Wound Location: [10:Gradually Appeared] [11:Gradually Appeared] [4:Gradually Appeared] Wounding Event: [10:Lupus] [11:Lupus] [4:Cellulitis] Primary Etiology: [10:N/A] [11:N/A] [4:Lupus] Secondary Etiology: [10:Asthma, Lupus Erythematosus] [11:Asthma, Lupus Erythematosus] [4:Asthma, Lupus Erythematosus] Comorbid History: [10:12/18/2021] [11:12/25/2021] [4:04/14/2021] Date Acquired: [10:4] [11:2] [4:38] Weeks of Treatment: [10:Open] [11:Open] [4:Open] Wound Status: [10:No] [11:No] [4:No] Wound Recurrence: [10:Yes] [11:No] [4:No] Clustered Wound: [10:1.2x2.3x0.3] [11:1.2x1.3x0.5] [4:0x0x0] Measurements L x W x D (cm) [10:2.168] [11:1.225] [4:0]  A (cm) : rea [10:0.65] [11:0.613] [4:0] Volume (cm) : [10:82.50%] [11:-1625.40%] [4:100.00%] % Reduction in A rea: [10:82.50%] [11:-2819.00%] [4:100.00%] % Reduction in Volume: [10:Full Thickness With Exposed Support] [11:Full Thickness With Exposed Support] [4:Full Thickness Without Exposed] Classification: [10:Structures Medium] [11:Structures Medium] [4:Support Structures Medium] Exudate A mount: [10:Serosanguineous] [11:Serosanguineous] [4:Serosanguineous] Exudate Type: [10:red, brown] [11:red, brown] [4:red, brown] Exudate Color: [10:No] [11:No] [4:Yes] Foul Odor A Cleansing: [10:fter N/A] [11:N/A] [4:No] Odor Anticipated Due to Product Use: [10:Distinct, outline attached] [11:Distinct, outline attached] [4:Distinct, outline attached] Wound Margin: [10:None Present (0%)] [11:Medium (34-66%)] [4:Large (67-100%)] Granulation A mount: [10:N/A] [11:Red, Pink] [4:Pink, Pale] Granulation Quality: [10:Large (67-100%)] [11:Medium  (34-66%)] [4:Small (1-33%)] Necrotic Amount: [10:Eschar, Adherent Slough] [11:Adherent Slough] [4:Adherent Slough] Necrotic Tissue: [10:Fat Layer (Subcutaneous Tissue): Yes Fascia: No] [4:Fat Layer (Subcutaneous Tissue): Yes] Exposed Structures: [10:Fascia: No Tendon: No Muscle: No Joint: No Bone: No None] [11:Fat Layer (Subcutaneous Tissue): No Tendon: No Muscle: No Joint: No Bone: No None] [4:Fascia: No Tendon: No Muscle: No Joint: No Bone: No Small (1-33%)] Epithelialization: [10:Debridement - Selective/Open Wound Debridement - Selective/Open Wound] [4:N/A] Debridement: Pre-procedure Verification/Time Out 14:50 [11:14:50] [4:N/A] Taken: [10:Lidocaine] [11:Lidocaine] [4:N/A] Pain Control: [10:Slough] [11:Slough] [4:N/A] Tissue Debrided: [10:Non-Viable Tissue] [11:Non-Viable Tissue] [4:N/A] Level: [10:2.76] [11:1.56] [4:N/A] Debridement A (sq cm): [10:rea Curette] [11:Curette] [4:N/A] Instrument: [10:Minimum] [11:Minimum] [4:N/A] Bleeding: [10:Pressure] [11:Pressure] [4:N/A] Hemostasis A chieved: [10:0] [11:0] [4:N/A] Procedural Pain: [10:0] [11:0] [4:N/A] Post Procedural Pain: [10:Procedure was tolerated well] [11:Procedure was tolerated well] [4:N/A] Debridement Treatment Response: [10:1.2x2.3x0.3] [11:1.2x1.3x0.5] [4:N/A] Post Debridement Measurements L x W x D (cm) [10:0.65] [11:0.613] [4:N/A] Post Debridement Volume: (cm) [10:Debridement] [11:Debridement] [4:N/A] Treatment Notes Wound #10 (Lower Leg) Wound Laterality: Right, Medial Cleanser Soap and Water Discharge Instruction: May shower and wash wound with dial antibacterial soap and water prior to dressing change. Peri-Wound Care Topical Primary Dressing MediHoney Gel, tube 1.5 (oz) Discharge Instruction: Apply to wound bed as instructed Secondary Dressing Zetuvit Plus Silicone Border Dressing 4x4 (in/in) Discharge Instruction: Apply silicone border over primary dressing as directed. Secured With Compression  Wrap Compression Stockings Add-Ons Wound #11 (Lower Leg) Wound Laterality: Right, Medial, Proximal Cleanser Soap and Water Discharge Instruction: May shower and wash wound with dial antibacterial soap and water prior to dressing change. Peri-Wound Care Topical Primary Dressing MediHoney Gel, tube 1.5 (oz) Discharge Instruction: Apply to wound bed as instructed Secondary Dressing Zetuvit Plus Silicone Border Dressing 4x4 (in/in) Discharge Instruction: Apply silicone border over primary dressing as directed. Secured With Compression Wrap Compression Stockings Add-Ons Wound #4 (Ankle) Wound Laterality: Left, Lateral Cleanser Peri-Wound Care Topical Primary Dressing Secondary Dressing Secured With Compression Wrap Compression Stockings Add-Ons Wound #4 (Ankle) Wound Laterality: Left, Lateral Cleanser Peri-Wound Care Topical Primary Dressing Secondary Dressing Secured With Compression Wrap Compression Stockings Add-Ons Electronic Signature(s) Signed: 01/16/2022 3:53:08 PM By: Kalman Shan DO Signed: 01/16/2022 4:06:07 PM By: Rhae Hammock RN Entered By: Kalman Shan on 01/16/2022 15:39:47 -------------------------------------------------------------------------------- Multi-Disciplinary Care Plan Details Patient Name: Date of Service: Kelly Lou [name] 01/16/2022 2:30 PM Medical Record Number: [id number] Patient Account Number: [id number] Date of Birth/Sex: Treating RN: 07-02-1996 (26 y.o. [name], [name] Primary Care Shyhiem Beeney: [name] Other Clinician: Referring Osby Sweetin: Treating Haylin Camilli/Extender: Elsworth Soho in Treatment: 52 Multidisciplinary Care Plan reviewed with physician Active Inactive Wound/Skin Impairment Nursing Diagnoses: Impaired tissue integrity Goals: Patient/caregiver will verbalize understanding of skin care regimen Date Initiated: 02/13/2021 Target Resolution Date: 02/10/2022 Goal  Status: Active Ulcer/skin breakdown will have a volume reduction of 30% by week 4  Date Initiated: 02/13/2021 Date Inactivated: 04/21/2021 Target Resolution Date: 03/13/2021 Unmet Reason: pt did not return to Goal Status: Unmet clinic for F/U Ulcer/skin breakdown will have a volume reduction of 80% by week 12 Date Initiated: 04/21/2021 Date Inactivated: 07/13/2021 Target Resolution Date: 07/27/2021 Goal Status: Met Interventions: Assess patient/caregiver ability to obtain necessary supplies Assess patient/caregiver ability to perform ulcer/skin care regimen upon admission and as needed Assess ulceration(s) every visit Provide education on ulcer and skin care Treatment Activities: Skin care regimen initiated : 02/13/2021 Topical wound management initiated : 02/13/2021 Notes: 06/29/21: Wounds 3 and 6 only at 80% volume reduction. Target date extended. Electronic Signature(s) Signed: 01/16/2022 4:06:07 PM By: Rhae Hammock RN Entered By: Rhae Hammock on 01/16/2022 14:53:42 -------------------------------------------------------------------------------- Pain Assessment Details Patient Name: Date of Service: Kelly Lou RIA Cipriano Bunker. 01/16/2022 2:30 PM Medical Record Number: [id number] Patient Account Number: [id number] Date of Birth/Sex: Treating RN: 1996/01/20 (26 y.o. [name], [name] Primary Care Dynasty Holquin: [name] Other Clinician: Referring Moses Odoherty: Treating Cully Luckow/Extender: Elsworth Soho in Treatment: 48 Active Problems Location of Pain Severity and Description of Pain Patient Has Paino Yes Site Locations Pain Location: Pain Location: Pain in Ulcers With Dressing Change: Yes Duration of the Pain. Constant / Intermittento Intermittent Rate the pain. Current Pain Level: 8 Worst Pain Level: 10 Least Pain Level: 0 Tolerable Pain Level: 8 Character of Pain Describe the Pain: Aching Pain Management and Medication Current Pain  Management: Medication: No Cold Application: No Rest: No Massage: No Activity: No T.E.N.S.: No Heat Application: No Leg drop or elevation: No Is the Current Pain Management Adequate: Adequate How does your wound impact your activities of daily livingo Sleep: No Bathing: No Appetite: No Relationship With Others: No Bladder Continence: No Emotions: No Bowel Continence: No Work: No Toileting: No Drive: No Dressing: No Hobbies: No Electronic Signature(s) Signed: 01/16/2022 4:06:07 PM By: Rhae Hammock RN Entered By: Rhae Hammock on 01/16/2022 14:38:11 -------------------------------------------------------------------------------- Patient/Caregiver Education Details Patient Name: Date of Service: Kelly [name] 5/23/2023andnbsp2:30 PM Medical Record Number: [id number] Patient Account Number: [id number] Date of Birth/Gender: Treating RN: 10-Jan-1996 (25 y.o. [name], [name] Primary Care Physician: [name] Other Clinician: Referring Physician: Treating Physician/Extender: Elsworth Soho in Treatment: 33 Education Assessment Education Provided To: Patient Education Topics Provided Wound/Skin Impairment: Methods: Explain/Verbal Responses: Reinforcements needed, State content correctly [employer]) Signed: 01/16/2022 4:06:07 PM By: Rhae Hammock RN Entered By: Rhae Hammock on 01/16/2022 14:53:53 -------------------------------------------------------------------------------- Wound Assessment Details Patient Name: Date of Service: Kelly Lou RIA Cipriano Bunker. 01/16/2022 2:30 PM Medical Record Number: [id number] Patient Account Number: [id number] Date of Birth/Sex: Treating RN: 15-May-1996 (26 y.o. [name], [name] Primary Care Zennie Ayars: [name] Other Clinician: Referring Cindia Hustead: Treating Keimani Laufer/Extender: Elsworth Soho in Treatment: 76 Wound Status Wound  Number: 10 Primary Etiology: Lupus Wound Location: Right, Medial Lower Leg Wound Status: Open Wounding Event: Gradually Appeared Comorbid History: Asthma, Lupus Erythematosus Date Acquired: 12/18/2021 Weeks Of Treatment: 4 Clustered Wound: Yes Photos Wound Measurements Length: (cm) 1.2 Width: (cm) 2.3 Depth: (cm) 0.3 Area: (cm) 2.168 Volume: (cm) 0.65 % Reduction in Area: 82.5% % Reduction in Volume: 82.5% Epithelialization: None Tunneling: No Undermining: No Wound Description Classification: Full Thickness With Exposed Support Structures Wound Margin: Distinct, outline attached Exudate Amount: Medium Exudate Type: Serosanguineous Exudate Color: red, brown Foul Odor After Cleansing: No Slough/Fibrino Yes Wound Bed Granulation Amount: None Present (0%) Exposed Structure Necrotic Amount: Large (67-100%) Fascia Exposed: No Necrotic Quality: Eschar, Adherent Slough  Fat Layer (Subcutaneous Tissue) Exposed: Yes Tendon Exposed: No Muscle Exposed: No Joint Exposed: No Bone Exposed: No Treatment Notes Wound #10 (Lower Leg) Wound Laterality: Right, Medial Cleanser Soap and Water Discharge Instruction: May shower and wash wound with dial antibacterial soap and water prior to dressing change. Peri-Wound Care Topical Primary Dressing MediHoney Gel, tube 1.5 (oz) Discharge Instruction: Apply to wound bed as instructed Secondary Dressing Zetuvit Plus Silicone Border Dressing 4x4 (in/in) Discharge Instruction: Apply silicone border over primary dressing as directed. Secured With Compression Wrap Compression Stockings Environmental education officer) Signed: 01/16/2022 4:06:07 PM By: Rhae Hammock RN Entered By: Rhae Hammock on 01/16/2022 14:46:11 -------------------------------------------------------------------------------- Wound Assessment Details Patient Name: Date of Service: Kelly Lou RIA Cipriano Bunker. 01/16/2022 2:30 PM Medical Record Number:  [id number] Patient Account Number: [id number] Date of Birth/Sex: Treating RN: 1996-08-06 (26 y.o. [name], [name] Primary Care Mozetta Murfin: [name] Other Clinician: Referring Giang Hemme: Treating Jadavion Spoelstra/Extender: Elsworth Soho in Treatment: 29 Wound Status Wound Number: 11 Primary Etiology: Lupus Wound Location: Right, Proximal, Medial Lower Leg Wound Status: Open Wounding Event: Gradually Appeared Comorbid History: Asthma, Lupus Erythematosus Date Acquired: 12/25/2021 Weeks Of Treatment: 2 Clustered Wound: No Photos Wound Measurements Length: (cm) 1.2 Width: (cm) 1.3 Depth: (cm) 0.5 Area: (cm) 1.225 Volume: (cm) 0.613 % Reduction in Area: -1625.4% % Reduction in Volume: -2819% Epithelialization: None Tunneling: No Undermining: No Wound Description Classification: Full Thickness With Exposed Support Structures Wound Margin: Distinct, outline attached Exudate Amount: Medium Exudate Type: Serosanguineous Exudate Color: red, brown Wound Bed Granulation Amount: Medium (34-66%) Granulation Quality: Red, Pink Necrotic Amount: Medium (34-66%) Necrotic Quality: Adherent Slough Foul Odor After Cleansing: No Slough/Fibrino Yes Exposed Structure Fascia Exposed: No Fat Layer (Subcutaneous Tissue) Exposed: No Tendon Exposed: No Muscle Exposed: No Joint Exposed: No Bone Exposed: No Treatment Notes Wound #11 (Lower Leg) Wound Laterality: Right, Medial, Proximal Cleanser Soap and Water Discharge Instruction: May shower and wash wound with dial antibacterial soap and water prior to dressing change. Peri-Wound Care Topical Primary Dressing MediHoney Gel, tube 1.5 (oz) Discharge Instruction: Apply to wound bed as instructed Secondary Dressing Zetuvit Plus Silicone Border Dressing 4x4 (in/in) Discharge Instruction: Apply silicone border over primary dressing as directed. Secured With Compression Wrap Compression  Stockings Environmental education officer) Signed: 01/16/2022 4:06:07 PM By: Rhae Hammock RN Entered By: Rhae Hammock on 01/16/2022 14:46:32 -------------------------------------------------------------------------------- Wound Assessment Details Patient Name: Date of Service: Kelly Lou RIA Cipriano Bunker. 01/16/2022 2:30 PM Medical Record Number: [id number] Patient Account Number: [id number] Date of Birth/Sex: Treating RN: [birth year] (26 y.o. [name], [name] Primary Care Jonaya Freshour: [name] Other Clinician: Referring Kristen Fromm: Treating Javani Spratt/Extender: Elsworth Soho in Treatment: 74 Wound Status Wound Number: 4 Primary Etiology: Cellulitis Wound Location: Left, Lateral Ankle Secondary Etiology: Lupus Wounding Event: Gradually Appeared Wound Status: Open Date Acquired: 04/14/2021 Comorbid History: Asthma, Lupus Erythematosus Weeks Of Treatment: 38 Clustered Wound: No Photos Wound Measurements Length: (cm) Width: (cm) Depth: (cm) Area: (cm) Volume: (cm) 0 % Reduction in Area: 100% 0 % Reduction in Volume: 100% 0 Epithelialization: Small (1-33%) 0 0 Wound Description Classification: Full Thickness Without Exposed Support Structures Wound Margin: Distinct, outline attached Exudate Amount: Medium Exudate Type: Serosanguineous Exudate Color: red, brown Foul Odor After Cleansing: Yes Due to Product Use: No Slough/Fibrino Yes Wound Bed Granulation Amount: Large (67-100%) Exposed Structure Granulation Quality: Pink, Pale Fascia Exposed: No Necrotic Amount: Small (1-33%) Fat Layer (Subcutaneous Tissue) Exposed: Yes Necrotic Quality: Adherent Slough Tendon Exposed: No Muscle Exposed: No Joint Exposed:  No Bone Exposed: No Electronic Signature(s) Signed: 01/16/2022 4:06:07 PM By: Rhae Hammock RN Entered By: Rhae Hammock on 01/16/2022  14:45:46 -------------------------------------------------------------------------------- Vitals Details Patient Name: Date of Service: Kelly Lou [name] 01/16/2022 2:30 PM Medical Record Number: [id number] Patient Account Number: [id number] Date of Birth/Sex: Treating RN: 10/26/1995 (26 y.o. [name], [name] Primary Care [employer]: [name] Other Clinician: Referring Gene Glazebrook: Treating Faith Branan/Extender: Elsworth Soho in Treatment: 48 Vital Signs Time Taken: 14:37 Temperature (F): 98.9 Height (in): 62 Pulse (bpm): 103 Respiratory Rate (breaths/min): 17 Blood Pressure (mmHg): 133/96 Reference Range: 80 - 120 mg / dl Electronic Signature(s) Signed: 01/16/2022 4:06:07 PM By: Rhae Hammock RN Entered By: Rhae Hammock on 01/16/2022 14:37:47

## 2022-01-16 NOTE — Progress Notes (Signed)
Kelly [name] ([id number]) Visit Report for 01/16/2022 Chief Complaint Document Details Patient Name: Date of Service: CHE, BELOW 01/16/2022 2:30 PM Medical Record Number: [id number] Patient Account Number: [id number] Date of Birth/Sex: Treating RN: [birth year] (25 y.o. [name], [name] Primary Care Provider: Dione Booze Other Clinician: Referring Provider: Treating Provider/Extender: Gennaro Africa in Treatment: 48 Information Obtained from: Patient Chief Complaint Bilateral lower extremity wounds 04/21/21: left lower extremity wound s/p cellulitis 09/01/21; right lower extremity wound Electronic Signature(s) Signed: 01/16/2022 3:53:08 PM By: Geralyn Corwin DO Entered By: Geralyn Corwin on 01/16/2022 15:39:59 -------------------------------------------------------------------------------- Debridement Details Patient Name: Date of Service: Kelly [name] [name] 01/16/2022 2:30 PM Medical Record Number: [id number] Patient Account Number: [id number] Date of Birth/Sex: Treating RN: 31-Dec-1995 (25 y.o. [name], [name] Primary Care Provider: Dione Booze Other Clinician: Referring Provider: Treating Provider/Extender: Gennaro Africa in Treatment: 48 Debridement Performed for Assessment: Wound #11 Right,Proximal,Medial Lower Leg Performed By: Physician Geralyn Corwin, DO Debridement Type: Debridement Level of Consciousness (Pre-procedure): Awake and Alert Pre-procedure Verification/Time Out Yes - 14:50 Taken: Start Time: 14:50 Pain Control: Lidocaine T Area Debrided (L x W): otal 1.2 (cm) x 1.3 (cm) = 1.56 (cm) Tissue and other material debrided: Viable, Non-Viable, Slough, Slough Level: Non-Viable Tissue Debridement Description: Selective/Open Wound Instrument: Curette Bleeding: Minimum Hemostasis Achieved: Pressure End Time: 14:50 Procedural Pain: 0 Post Procedural Pain: 0 Response to  Treatment: Procedure was tolerated well Level of Consciousness (Post- Awake and Alert procedure): Post Debridement Measurements of Total Wound Length: (cm) 1.2 Width: (cm) 1.3 Depth: (cm) 0.5 Volume: (cm) 0.613 Character of Wound/Ulcer Post Debridement: Improved Post Procedure Diagnosis Same as Pre-procedure Electronic Signature(s) Signed: 01/16/2022 3:53:08 PM By: Geralyn Corwin DO Signed: 01/16/2022 4:06:07 PM By: Fonnie Mu RN Entered By: Fonnie Mu on 01/16/2022 14:55:35 -------------------------------------------------------------------------------- Debridement Details Patient Name: Date of Service: Kelly [name] [name] 01/16/2022 2:30 PM Medical Record Number: [id number] Patient Account Number: [id number] Date of Birth/Sex: Treating RN: 31-Aug-1995 (25 y.o. [name], [name] Primary Care Provider: Dione Booze Other Clinician: Referring Provider: Treating Provider/Extender: Gennaro Africa in Treatment: 48 Debridement Performed for Assessment: Wound #10 Right,Medial Lower Leg Performed By: Physician Geralyn Corwin, DO Debridement Type: Debridement Level of Consciousness (Pre-procedure): Awake and Alert Pre-procedure Verification/Time Out Yes - 14:50 Taken: Start Time: 14:50 Pain Control: Lidocaine T Area Debrided (L x W): otal 1.2 (cm) x 2.3 (cm) = 2.76 (cm) Tissue and other material debrided: Viable, Non-Viable, Slough, Slough Level: Non-Viable Tissue Debridement Description: Selective/Open Wound Instrument: Curette Bleeding: Minimum Hemostasis Achieved: Pressure End Time: 14:50 Procedural Pain: 0 Post Procedural Pain: 0 Response to Treatment: Procedure was tolerated well Level of Consciousness (Post- Awake and Alert procedure): Post Debridement Measurements of Total Wound Length: (cm) 1.2 Width: (cm) 2.3 Depth: (cm) 0.3 Volume: (cm) 0.65 Character of Wound/Ulcer Post Debridement: Improved Post Procedure  Diagnosis Same as Pre-procedure Electronic Signature(s) Signed: 01/16/2022 3:53:08 PM By: Geralyn Corwin DO Signed: 01/16/2022 4:06:07 PM By: Fonnie Mu RN Entered By: Fonnie Mu on 01/16/2022 14:56:17 -------------------------------------------------------------------------------- HPI Details Patient Name: Date of Service: Kelly [name] [name] 01/16/2022 2:30 PM Medical Record Number: [id number] Patient Account Number: [id number] Date of Birth/Sex: Treating RN: [birth year] (25 y.o. [name], [name] Primary Care Provider: Dione Booze Other Clinician: Referring Provider: Treating Provider/Extender: Gennaro Africa in Treatment: 48 History of Present Illness HPI Description: Admission 6/21 Kelly [name] is A 26 year old female with a past medical history of systemic  lupus erythematosus that presents with bilateral lower extremity wounds that started in April 2022. She has had skin issues in the past where she developed very small wounds but these healed with time. She has never had open wounds like she does on her legs. She currently reports minimal drainage to the wounds. She does have pain to these areas but has been overall stable. She denies signs of infection. She has visited the ED for this issue and was recently prescribed doxycycline for possible cellulitis. She follows with [facility] for her SLE and is currently on Plaquenil, prednisone and CellCept. 6/27; patient presents for 1 week follow-up. She states she saw her rheumatologist on 6/22 and prednisone was increased from 10 mg to 20 mg daily. She is currently at the highest dose of Plaquenil and CellCept and is going to try an infusion later today at her regimen. She reports improvement to the wounds. She has been using collagen with dressing changes. She currently denies signs of infection. Readmission 8/26 Patient presents to clinic today for open wounds to her left  lower extremity. Her previous wounds that were treated have closed. She states that on 8/9 she developed cellulitis and was treated with 2 rounds of antibiotics. Her cellulitis has resolved. She had blisters that opened and are now large wounds. She has seen her dermatologist and rheumatologist since she has systemic lupus and a hard time healing. No changes have been made to her medications. She was recommended antibiotic ointment by her dermatologist to her wounds She picked up the antibiotic ointment today.. She reports pain to these areas. She denies purulent drainage, increased warmth or erythema to the left lower extremity. She currently keeps the areas covered. Of note she had an Achilles tendon rupture on her right leg and is currently in a soft cast. 9/13; patient presents for follow-up. She missed her last clinic appointment. She has been using Santyl to the wound beds daily. She has no issues or complaints today. She reports the Santyl is helping clean up the wounds. She currently denies signs of infection. 9/22; patient states that 1 week ago she was seen by dermatology and they placed an Unna boot with Bactroban on her left lower extremity. Today she presents with a Unna boot in place. She currently denies signs of infection. 10/7; patient presents for follow-up. She reports improvement. She has been using Santyl to the areas of nonviable tissue and collagen to granulation tissue. She has been approved for infusions for her lupus. She is going to start these soon. These will be injection she can do at home. She currently denies signs of infection. 10/21; patient presents for follow-up. She reports continued improvement to her wound healing. She has been using Santyl to all areas except for the superior posterior left leg wound and she is using collagen to this. She has started her injection therapy for lupus. She started 1 week ago. She currently denies signs of infection. 11/3; patient  presents for follow-up. She has no issues or complaints today. She is on her third week of injection therapy for lupus and tolerating this well. She has no issues or complaints today. She denies signs of infection. 11/17; patient presents for 2-week follow-up. She reports improvement in wound healing. She denies signs of infection. 12/1; patient presents for follow-up. She has been using collagen to the anterior left lower extremity wounds and Santyl to the left lateral malleolus. She has no issues or complaints today. 12/15; patient presents for follow-up. She  is using collagen to the 2 anterior left leg wounds and Santyl to the left lateral malleolus wound. She reports improvement in wound healing. She denies signs of infection. 12/22; patient presents because she is having purulent drainage from her anterior left leg wound. She reports no inciting event and states that the area started draining spontaneously. She was hoping that the drainage would stop however this has not improved. She states this started 5 days ago. She reports tenderness to the wound bed. 12/29; patient presents for follow-up. She was able to take Augmentin for 4 days but did not tolerate the GI side effects. She continues to take doxycycline. She reports improvement in drainage. She no longer has increased warmth to the surrounding wound bed. She ran out of [place] and has been using Vaseline to the wound beds. 1/6; Patient presents for follow-up. She has been taking Keflex without issues. She reports improvement in drainage and wound healing T the left lower o extremity. Unfortunately she developed another wound spontaneously to the right anterior lower extremity. She denies systemic signs of infection. 1/16; patient presents for follow-up. Patient completed her first prescription of Keflex and has not started the second. She reports no more drainage to the wound beds. She states there was an issue with insurance however she  is going to be able to obtain Santyl soon. She has been using Dakin's wet-to-dry dressings to the right anterior leg and left lateral ankle. She has been using collagen to the distal anterior left leg wound and reports that the proximal anterior left leg wound has healed. She denies systemic signs of infection. 1/31; patient presents for follow-up. She reports improvement to the left leg wounds. She reports increased inflammation to the right leg wound. She thinks she is having a reaction to the foam border dressing on the right leg. She is unable to obtain Santyl. She has been using Dakin's wet-to-dry dressings to the right anterior leg and left lateral ankle. She has been using collagen to the distal anterior left leg wound. 2/13; patient presents for follow-up. She reports improvement in her wound healing. One of the wounds to the anterior aspect of the left lower leg has slightly reopened. She denies signs of infection. 2/27; patient presents for follow-up. She has no issues or complaints today. She denies signs of infection. 3/20; patient presents for follow-up. She has been using Medihoney to the right lower extremity wound and left lateral ankle wound. She continues to use collagen to the left anterior leg wound. She has no issues or complaints today. She denies signs of infection. She reports improvement in wound 4/10; patient has been using Medihoney to the right anterior lower leg wound and silver collagen on the left including the left ankle and left anterior. Fortunately the left anterior lower leg wound is healed. Both of the areas on the left ankle and the right anterior lower leg appear clean. These wounds are in the setting of chronic lupus. She has had multitude of chronic wounds and a rash on her arms. She tells me she has had both her arms and legs biopsied by dermatology at [facility] and also follows with rheumatology at [facility] although she is not had an appointment this year. She does not  have a history of blood clots either arterial or venous. 4/24; patient presents for follow-up. She has been using collagen to the open wound beds. She has developed new areas to her right lower leg. She currently denies signs of infection. 5/9; patient  presents for follow-up. She states she is seeing her rheumatologist next week. She has been using Medihoney to all the wound beds except for the lateral left malleolus. She has been using collegen here. She reports improvement to some areas and other areas are flaring due to her lupus. She currently denies signs of infection. 5/23; Patient saw her rheumatologist last week. She has no active lupus activity at this time. Other options for treatment were given. She is going to continue with her current therapy of weekly injections. She reports improvement to Her wound healing on the left ankle. She has been putting Medihoney here. On the right lower extremity she has had a flareup and has 2 open ulcers. She currently denies signs of infection including increased warmth, erythema or purulent drainage. She has been using Medihoney here as well. Electronic Signature(s) Signed: 01/16/2022 3:53:08 PM By: Geralyn Corwin DO Entered By: Geralyn Corwin on 01/16/2022 15:45:26 -------------------------------------------------------------------------------- Physical Exam Details Patient Name: Date of Service: Kelly [name] RIA Cherly Anderson. 01/16/2022 2:30 PM Medical Record Number: [id number] Patient Account Number: [id number] Date of Birth/Sex: Treating RN: [birth year] (25 y.o. [name], [name] Primary Care Provider: Dione Booze Other Clinician: Referring Provider: Treating Provider/Extender: Gennaro Africa in Treatment: 48 Constitutional respirations regular, non-labored and within target range for patient.. Cardiovascular 2+ dorsalis pedis/posterior tibialis pulses. Psychiatric pleasant and cooperative. Notes Left lower  extremity: T the lateral ankle there is epithelization to the previous wound site. No more open wounds to the left lower extremity. o Right lower extremity: T the medial aspect there are 2 open wounds with nonviable surface throughout. No signs of surrounding tissue infection. o Electronic Signature(s) Signed: 01/16/2022 3:53:08 PM By: Geralyn Corwin DO Entered By: Geralyn Corwin on 01/16/2022 15:46:51 -------------------------------------------------------------------------------- Physician Orders Details Patient Name: Date of Service: Kelly [name] RIA Cherly Anderson. 01/16/2022 2:30 PM Medical Record Number: [id number] Patient Account Number: [id number] Date of Birth/Sex: Treating RN: 02-Nov-1995 (25 y.o. [name], [name] Primary Care Provider: Dione Booze Other Clinician: Referring Provider: Treating Provider/Extender: Gennaro Africa in Treatment: [phone] Verbal / Phone Orders: No Diagnosis Coding Follow-up Appointments Return appointment in 3 weeks. - w/ Dr. Mikey Bussing and Maryruth Bun Room # 9 Bathing/ Shower/ Hygiene May shower and wash wound with soap and water. Edema Control - Lymphedema / SCD / Other Elevate legs to the level of the heart or above for 30 minutes daily and/or when sitting, a frequency of: - 3-4 times a day throughout the day. Avoid standing for long periods of time. Additional Orders / Instructions Follow Nutritious Diet Wound Treatment Wound #10 - Lower Leg Wound Laterality: Right, Medial Cleanser: Soap and Water Every Other Day/15 Days Discharge Instructions: May shower and wash wound with dial antibacterial soap and water prior to dressing change. Cleanser: Wound Cleanser (DME) (Generic) Every Other Day/15 Days Discharge Instructions: Cleanse the wound with wound cleanser prior to applying a clean dressing using gauze sponges, not tissue or cotton balls. Peri-Wound Care: Skin Prep (DME) (Generic) Every Other Day/15 Days Discharge Instructions:  Use skin prep as directed Prim Dressing: MediHoney Gel, tube 1.5 (oz) Every Other Day/15 Days ary Discharge Instructions: Apply to wound bed as instructed Secondary Dressing: Woven Gauze Sponge, Non-Sterile 4x4 in (DME) (Generic) Every Other Day/15 Days Discharge Instructions: Apply over primary dressing as directed. Secondary Dressing: Zetuvit Plus Silicone Border Dressing 4x4 (in/in) (DME) (Generic) Every Other Day/15 Days Discharge Instructions: Apply silicone border over primary dressing as directed. Wound #11 - Lower Leg Wound Laterality:  Right, Medial, Proximal Cleanser: Soap and Water Every Other Day/15 Days Discharge Instructions: May shower and wash wound with dial antibacterial soap and water prior to dressing change. Cleanser: Wound Cleanser (DME) (Generic) Every Other Day/15 Days Discharge Instructions: Cleanse the wound with wound cleanser prior to applying a clean dressing using gauze sponges, not tissue or cotton balls. Peri-Wound Care: Skin Prep (DME) (Generic) Every Other Day/15 Days Discharge Instructions: Use skin prep as directed Prim Dressing: MediHoney Gel, tube 1.5 (oz) Every Other Day/15 Days ary Discharge Instructions: Apply to wound bed as instructed Secondary Dressing: Woven Gauze Sponge, Non-Sterile 4x4 in (DME) (Generic) Every Other Day/15 Days Discharge Instructions: Apply over primary dressing as directed. Secondary Dressing: Zetuvit Plus Silicone Border Dressing 4x4 (in/in) (DME) (Generic) Every Other Day/15 Days Discharge Instructions: Apply silicone border over primary dressing as directed. Electronic Signature(s) Signed: 01/16/2022 3:53:08 PM By: Geralyn Corwin DO Entered By: Geralyn Corwin on 01/16/2022 15:47:03 -------------------------------------------------------------------------------- Problem List Details Patient Name: Date of Service: Kelly [name] [name] 01/16/2022 2:30 PM Medical Record Number: [id number] Patient Account Number:  [id number] Date of Birth/Sex: Treating RN: August 20, 1996 (25 y.o. [name], [name] Primary Care Provider: Dione Booze Other Clinician: Referring Provider: Treating Provider/Extender: Gennaro Africa in Treatment: 48 Active Problems ICD-10 Encounter Code Description Active Date MDM Diagnosis [phone] Non-pressure chronic ulcer of other part of left lower leg with fat layer exposed11/10/2020 No Yes L97.812 Non-pressure chronic ulcer of other part of right lower leg with fat layer 06/29/2021 No Yes exposed M32.9 Systemic lupus erythematosus, unspecified 02/14/2021 No Yes Inactive Problems ICD-10 Code Description Active Date Inactive Date L97.819 Non-pressure chronic ulcer of other part of right lower leg with unspecified severity 02/13/2021 02/13/2021 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 with unspecified severity 02/13/2021 02/13/2021 Z66.063K Unspecified open wound, left lower leg, initial encounter 04/21/2021 04/21/2021 S91.002A Unspecified open wound, left ankle, initial encounter 04/21/2021 04/21/2021 Resolved Problems ICD-10 Code Description Active Date Resolved Date L03.116 Cellulitis of left lower limb 08/24/2021 08/24/2021 Electronic Signature(s) Signed: 01/16/2022 3:53:08 PM By: Geralyn Corwin DO Entered By: Geralyn Corwin on 01/16/2022 15:39:40 -------------------------------------------------------------------------------- Progress Note Details Patient Name: Date of Service: Kelly [name] [name] 01/16/2022 2:30 PM Medical Record Number: [id number] Patient Account Number: [id number] Date of Birth/Sex: Treating RN: September 16, 1995 (25 y.o. [name], [name] Primary Care Provider: Dione Booze Other Clinician: Referring Provider: Treating Provider/Extender: Gennaro Africa in Treatment: 48 Subjective Chief Complaint Information obtained from Patient Bilateral lower extremity wounds  04/21/21: left lower extremity wound s/p cellulitis 09/01/21; right lower extremity wound History of Present Illness (HPI) Admission 6/21 Kelly [name] is A 26 year old female with a past medical history of systemic lupus erythematosus that presents with bilateral lower extremity wounds that started in April 2022. She has had skin issues in the past where she developed very small wounds but these healed with time. She has never had open wounds like she does on her legs. She currently reports minimal drainage to the wounds. She does have pain to these areas but has been overall stable. She denies signs of infection. She has visited the ED for this issue and was recently prescribed doxycycline for possible cellulitis. She follows with [facility] for her SLE and is currently on Plaquenil, prednisone and CellCept. 6/27; patient presents for 1 week follow-up. She states she saw her rheumatologist on 6/22 and prednisone was increased from 10 mg to 20 mg daily. She is currently at the highest dose  of Plaquenil and CellCept and is going to try an infusion later today at her regimen. She reports improvement to the wounds. She has been using collagen with dressing changes. She currently denies signs of infection. Readmission 8/26 Patient presents to clinic today for open wounds to her left lower extremity. Her previous wounds that were treated have closed. She states that on 8/9 she developed cellulitis and was treated with 2 rounds of antibiotics. Her cellulitis has resolved. She had blisters that opened and are now large wounds. She has seen her dermatologist and rheumatologist since she has systemic lupus and a hard time healing. No changes have been made to her medications. She was recommended antibiotic ointment by her dermatologist to her wounds She picked up the antibiotic ointment today.. She reports pain to these areas. She denies purulent drainage, increased warmth or erythema to the left  lower extremity. She currently keeps the areas covered. Of note she had an Achilles tendon rupture on her right leg and is currently in a soft cast. 9/13; patient presents for follow-up. She missed her last clinic appointment. She has been using Santyl to the wound beds daily. She has no issues or complaints today. She reports the Santyl is helping clean up the wounds. She currently denies signs of infection. 9/22; patient states that 1 week ago she was seen by dermatology and they placed an Unna boot with Bactroban on her left lower extremity. Today she presents with a Unna boot in place. She currently denies signs of infection. 10/7; patient presents for follow-up. She reports improvement. She has been using Santyl to the areas of nonviable tissue and collagen to granulation tissue. She has been approved for infusions for her lupus. She is going to start these soon. These will be injection she can do at home. She currently denies signs of infection. 10/21; patient presents for follow-up. She reports continued improvement to her wound healing. She has been using Santyl to all areas except for the superior posterior left leg wound and she is using collagen to this. She has started her injection therapy for lupus. She started 1 week ago. She currently denies signs of infection. 11/3; patient presents for follow-up. She has no issues or complaints today. She is on her third week of injection therapy for lupus and tolerating this well. She has no issues or complaints today. She denies signs of infection. 11/17; patient presents for 2-week follow-up. She reports improvement in wound healing. She denies signs of infection. 12/1; patient presents for follow-up. She has been using collagen to the anterior left lower extremity wounds and Santyl to the left lateral malleolus. She has no issues or complaints today. 12/15; patient presents for follow-up. She is using collagen to the 2 anterior left leg wounds  and Santyl to the left lateral malleolus wound. She reports improvement in wound healing. She denies signs of infection. 12/22; patient presents because she is having purulent drainage from her anterior left leg wound. She reports no inciting event and states that the area started draining spontaneously. She was hoping that the drainage would stop however this has not improved. She states this started 5 days ago. She reports tenderness to the wound bed. 12/29; patient presents for follow-up. She was able to take Augmentin for 4 days but did not tolerate the GI side effects. She continues to take doxycycline. She reports improvement in drainage. She no longer has increased warmth to the surrounding wound bed. She ran out of [place] and has been  using Vaseline to the wound beds. 1/6; Patient presents for follow-up. She has been taking Keflex without issues. She reports improvement in drainage and wound healing T the left lower o extremity. Unfortunately she developed another wound spontaneously to the right anterior lower extremity. She denies systemic signs of infection. 1/16; patient presents for follow-up. Patient completed her first prescription of Keflex and has not started the second. She reports no more drainage to the wound beds. She states there was an issue with insurance however she is going to be able to obtain Santyl soon. She has been using Dakin's wet-to-dry dressings to the right anterior leg and left lateral ankle. She has been using collagen to the distal anterior left leg wound and reports that the proximal anterior left leg wound has healed. She denies systemic signs of infection. 1/31; patient presents for follow-up. She reports improvement to the left leg wounds. She reports increased inflammation to the right leg wound. She thinks she is having a reaction to the foam border dressing on the right leg. She is unable to obtain Santyl. She has been using Dakin's wet-to-dry dressings  to the right anterior leg and left lateral ankle. She has been using collagen to the distal anterior left leg wound. 2/13; patient presents for follow-up. She reports improvement in her wound healing. One of the wounds to the anterior aspect of the left lower leg has slightly reopened. She denies signs of infection. 2/27; patient presents for follow-up. She has no issues or complaints today. She denies signs of infection. 3/20; patient presents for follow-up. She has been using Medihoney to the right lower extremity wound and left lateral ankle wound. She continues to use collagen to the left anterior leg wound. She has no issues or complaints today. She denies signs of infection. She reports improvement in wound 4/10; patient has been using Medihoney to the right anterior lower leg wound and silver collagen on the left including the left ankle and left anterior. Fortunately the left anterior lower leg wound is healed. Both of the areas on the left ankle and the right anterior lower leg appear clean. These wounds are in the setting of chronic lupus. She has had multitude of chronic wounds and a rash on her arms. She tells me she has had both her arms and legs biopsied by dermatology at [facility] and also follows with rheumatology at [facility] although she is not had an appointment this year. She does not have a history of blood clots either arterial or venous. 4/24; patient presents for follow-up. She has been using collagen to the open wound beds. She has developed new areas to her right lower leg. She currently denies signs of infection. 5/9; patient presents for follow-up. She states she is seeing her rheumatologist next week. She has been using Medihoney to all the wound beds except for the lateral left malleolus. She has been using collegen here. She reports improvement to some areas and other areas are flaring due to her lupus. She currently denies signs of infection. 5/23; Patient saw her  rheumatologist last week. She has no active lupus activity at this time. Other options for treatment were given. She is going to continue with her current therapy of weekly injections. She reports improvement to Her wound healing on the left ankle. She has been putting Medihoney here. On the right lower extremity she has had a flareup and has 2 open ulcers. She currently denies signs of infection including increased warmth, erythema or purulent drainage. She  has been using Medihoney here as well. Patient History Information obtained from Patient. Family History Diabetes - Paternal Grandparents, Hypertension - Paternal Grandparents, Thyroid Problems - Maternal Grandparents,Paternal Grandparents, No family history of Cancer, Heart Disease, Hereditary Spherocytosis, Kidney Disease, Lung Disease, Seizures, Stroke, Tuberculosis. Social History Never smoker, Marital Status - Married, Alcohol Use - Moderate, Drug Use - Current History - Marijuana, Caffeine Use - Daily - Coffee. Medical History Respiratory Patient has history of Asthma Immunological Patient has history of Lupus Erythematosus Objective Constitutional respirations regular, non-labored and within target range for patient.. Vitals Time Taken: 2:37 PM, Height: 62 in, Temperature: 98.9 F, Pulse: 103 bpm, Respiratory Rate: 17 breaths/min, Blood Pressure: 133/96 mmHg. Cardiovascular 2+ dorsalis pedis/posterior tibialis pulses. Psychiatric pleasant and cooperative. General Notes: Left lower extremity: T the lateral ankle there is epithelization to the previous wound site. No more open wounds to the left lower extremity. o Right lower extremity: T the medial aspect there are 2 open wounds with nonviable surface throughout. No signs of surrounding tissue infection. o Integumentary (Hair, Skin) Wound #10 status is Open. Original cause of wound was Gradually Appeared. The date acquired was: 12/18/2021. The wound has been in treatment 4  weeks. The wound is located on the Right,Medial Lower Leg. The wound measures 1.2cm length x 2.3cm width x 0.3cm depth; 2.168cm^2 area and 0.65cm^3 volume. There is Fat Layer (Subcutaneous Tissue) exposed. There is no tunneling or undermining noted. There is a medium amount of serosanguineous drainage noted. The wound margin is distinct with the outline attached to the wound base. There is no granulation within the wound bed. There is a large (67-100%) amount of necrotic tissue within the wound bed including Eschar and Adherent Slough. Wound #11 status is Open. Original cause of wound was Gradually Appeared. The date acquired was: 12/25/2021. The wound has been in treatment 2 weeks. The wound is located on the Right,Proximal,Medial Lower Leg. The wound measures 1.2cm length x 1.3cm width x 0.5cm depth; 1.225cm^2 area and 0.613cm^3 volume. There is no tunneling or undermining noted. There is a medium amount of serosanguineous drainage noted. The wound margin is distinct with the outline attached to the wound base. There is medium (34-66%) red, pink granulation within the wound bed. There is a medium (34-66%) amount of necrotic tissue within the wound bed including Adherent Slough. Wound #4 status is Open. Original cause of wound was Gradually Appeared. The date acquired was: 04/14/2021. The wound has been in treatment 38 weeks. The wound is located on the Left,Lateral Ankle. The wound measures 0cm length x 0cm width x 0cm depth; 0cm^2 area and 0cm^3 volume. There is Fat Layer (Subcutaneous Tissue) exposed. There is a medium amount of serosanguineous drainage noted. Foul odor after cleansing was noted. The wound margin is distinct with the outline attached to the wound base. There is large (67-100%) pink, pale granulation within the wound bed. There is a small (1-33%) amount of necrotic tissue within the wound bed including Adherent Slough. Assessment Active Problems ICD-10 Non-pressure chronic ulcer of  other part of left lower leg with fat layer exposed Non-pressure chronic ulcer of other part of right lower leg with fat layer exposed Systemic lupus erythematosus, unspecified Patient's left ankle wound has healed. She now has 2 larger wounds on the right lower extremity. This is a reoccurring cycle for the patient. She usually gets a flareup and develops open wounds that get worse and ultimately heal up. There is no obvious etiology here. She is going to see  dermatology in 2 weeks and hopefully they will have an answer for why she has ongoing wounds. I debrided nonviable tissue to the remaining wounds. I also recommended medihoney. Procedures Wound #10 Pre-procedure diagnosis of Wound #10 is a Lupus located on the Right,Medial Lower Leg . There was a Selective/Open Wound Non-Viable Tissue Debridement with a total area of 2.76 sq cm performed by Geralyn Corwin, DO. With the following instrument(s): Curette to remove Viable and Non-Viable tissue/material. Material removed includes Slough after achieving pain control using Lidocaine. No specimens were taken. A time out was conducted at 14:50, prior to the start of the procedure. A Minimum amount of bleeding was controlled with Pressure. The procedure was tolerated well with a pain level of 0 throughout and a pain level of 0 following the procedure. Post Debridement Measurements: 1.2cm length x 2.3cm width x 0.3cm depth; 0.65cm^3 volume. Character of Wound/Ulcer Post Debridement is improved. Post procedure Diagnosis Wound #10: Same as Pre-Procedure Wound #11 Pre-procedure diagnosis of Wound #11 is a Lupus located on the Right,Proximal,Medial Lower Leg . There was a Selective/Open Wound Non-Viable Tissue Debridement with a total area of 1.56 sq cm performed by Geralyn Corwin, DO. With the following instrument(s): Curette to remove Viable and Non-Viable tissue/material. Material removed includes Slough after achieving pain control using Lidocaine.  No specimens were taken. A time out was conducted at 14:50, prior to the start of the procedure. A Minimum amount of bleeding was controlled with Pressure. The procedure was tolerated well with a pain level of 0 throughout and a pain level of 0 following the procedure. Post Debridement Measurements: 1.2cm length x 1.3cm width x 0.5cm depth; 0.613cm^3 volume. Character of Wound/Ulcer Post Debridement is improved. Post procedure Diagnosis Wound #11: Same as Pre-Procedure Plan Follow-up Appointments: Return appointment in 3 weeks. - w/ Dr. Mikey Bussing and Maryruth Bun Room # 9 Bathing/ Shower/ Hygiene: May shower and wash wound with soap and water. Edema Control - Lymphedema / SCD / Other: Elevate legs to the level of the heart or above for 30 minutes daily and/or when sitting, a frequency of: - 3-4 times a day throughout the day. Avoid standing for long periods of time. Additional Orders / Instructions: Follow Nutritious Diet WOUND #10: - Lower Leg Wound Laterality: Right, Medial Cleanser: Soap and Water Every Other Day/15 Days Discharge Instructions: May shower and wash wound with dial antibacterial soap and water prior to dressing change. Cleanser: Wound Cleanser (DME) (Generic) Every Other Day/15 Days Discharge Instructions: Cleanse the wound with wound cleanser prior to applying a clean dressing using gauze sponges, not tissue or cotton balls. Peri-Wound Care: Skin Prep (DME) (Generic) Every Other Day/15 Days Discharge Instructions: Use skin prep as directed Prim Dressing: MediHoney Gel, tube 1.5 (oz) Every Other Day/15 Days ary Discharge Instructions: Apply to wound bed as instructed Secondary Dressing: Woven Gauze Sponge, Non-Sterile 4x4 in (DME) (Generic) Every Other Day/15 Days Discharge Instructions: Apply over primary dressing as directed. Secondary Dressing: Zetuvit Plus Silicone Border Dressing 4x4 (in/in) (DME) (Generic) Every Other Day/15 Days Discharge Instructions: Apply silicone  border over primary dressing as directed. WOUND #11: - Lower Leg Wound Laterality: Right, Medial, Proximal Cleanser: Soap and Water Every Other Day/15 Days Discharge Instructions: May shower and wash wound with dial antibacterial soap and water prior to dressing change. Cleanser: Wound Cleanser (DME) (Generic) Every Other Day/15 Days Discharge Instructions: Cleanse the wound with wound cleanser prior to applying a clean dressing using gauze sponges, not tissue or cotton balls. Peri-Wound Care: Skin Prep (  DME) (Generic) Every Other Day/15 Days Discharge Instructions: Use skin prep as directed Prim Dressing: MediHoney Gel, tube 1.5 (oz) Every Other Day/15 Days ary Discharge Instructions: Apply to wound bed as instructed Secondary Dressing: Woven Gauze Sponge, Non-Sterile 4x4 in (DME) (Generic) Every Other Day/15 Days Discharge Instructions: Apply over primary dressing as directed. Secondary Dressing: Zetuvit Plus Silicone Border Dressing 4x4 (in/in) (DME) (Generic) Every Other Day/15 Days Discharge Instructions: Apply silicone border over primary dressing as directed. 1. In office sharp debridement 2. Medihoney 3. Follow-up in 3 weeks Electronic Signature(s) Signed: 01/16/2022 3:53:08 PM By: Geralyn Corwin DO Entered By: Geralyn Corwin on 01/16/2022 15:52:15 -------------------------------------------------------------------------------- HxROS Details Patient Name: Date of Service: Kelly [name] [name] 01/16/2022 2:30 PM Medical Record Number: [id number] Patient Account Number: [id number] Date of Birth/Sex: Treating RN: 05/04/1996 (25 y.o. [name], [name] Primary Care Provider: Dione Booze Other Clinician: Referring Provider: Treating Provider/Extender: Gennaro Africa in Treatment: 48 Information Obtained From Patient Respiratory Medical History: Positive for: Asthma Immunological Medical History: Positive for: Lupus  Erythematosus Immunizations Pneumococcal Vaccine: Received Pneumococcal Vaccination: No Implantable Devices None Family and Social History Cancer: No; Diabetes: Yes - Paternal Grandparents; Heart Disease: No; Hereditary Spherocytosis: No; Hypertension: Yes - Paternal Grandparents; Kidney Disease: No; Lung Disease: No; Seizures: No; Stroke: No; Thyroid Problems: Yes - Maternal Grandparents,Paternal Grandparents; Tuberculosis: No; Never smoker; Marital Status - Married; Alcohol Use: Moderate; Drug Use: Current History - Marijuana; Caffeine Use: Daily - Coffee; Financial Concerns: No; Food, Clothing or Shelter Needs: No; Support System Lacking: No Electronic Signature(s) Signed: 01/16/2022 3:53:08 PM By: Geralyn Corwin DO Signed: 01/16/2022 4:06:07 PM By: Fonnie Mu RN Entered By: Geralyn Corwin on 01/16/2022 15:45:33 -------------------------------------------------------------------------------- SuperBill Details Patient Name: Date of Service: Kelly [name] [name] 01/16/2022 Medical Record Number: [id number] Patient Account Number: [id number] Date of Birth/Sex: Treating RN: 12-11-1995 (25 y.o. [name], [name] Primary Care Provider: Dione Booze Other Clinician: Referring Provider: Treating Provider/Extender: Gennaro Africa in Treatment: 48 Diagnosis Coding ICD-10 Codes Code Description [phone] Non-pressure chronic ulcer of other part of left lower leg with fat layer exposed L97.812 Non-pressure chronic ulcer of other part of right lower leg with fat layer exposed M32.9 Systemic lupus erythematosus, unspecified Facility Procedures CPT4 Code: [id number] Description: [phone] - DEBRIDE WOUND 1ST 20 SQ CM OR < ICD-10 Diagnosis Description L97.812 Non-pressure chronic ulcer of other part of right lower leg with fat layer expos Modifier: ed Quantity: 1 Physician Procedures : CPT4 Code Description Modifier [id number] - WC PHYS DEBR WO ANESTH  20 SQ CM ICD-10 Diagnosis Description L97.812 Non-pressure chronic ulcer of other part of right lower leg with fat layer exposed Quantity: 1 Electronic Signature(s) Signed: 01/16/2022 3:53:08 PM By: Geralyn Corwin DO Entered By: Geralyn Corwin on 01/16/2022 15:52:41

## 2022-01-24 ENCOUNTER — Emergency Department (HOSPITAL_COMMUNITY): Payer: Managed Care, Other (non HMO)

## 2022-01-24 ENCOUNTER — Emergency Department (HOSPITAL_COMMUNITY)
Admission: EM | Admit: 2022-01-24 | Discharge: 2022-01-24 | Disposition: A | Discharge status: Home / Self Care | Payer: Managed Care, Other (non HMO) | Attending: Emergency Medicine | Admitting: Emergency Medicine

## 2022-01-24 ENCOUNTER — Other Ambulatory Visit: Payer: Self-pay

## 2022-01-24 ENCOUNTER — Emergency Department (HOSPITAL_BASED_OUTPATIENT_CLINIC_OR_DEPARTMENT_OTHER)
Admit: 2022-01-24 | Discharge: 2022-01-24 | Disposition: A | Discharge status: Home / Self Care | Payer: Managed Care, Other (non HMO) | Attending: Emergency Medicine | Admitting: Emergency Medicine

## 2022-01-24 DIAGNOSIS — Z4800 Encounter for change or removal of nonsurgical wound dressing: Secondary | ICD-10-CM | POA: Insufficient documentation

## 2022-01-24 DIAGNOSIS — S81801A Unspecified open wound, right lower leg, initial encounter: Secondary | ICD-10-CM

## 2022-01-24 DIAGNOSIS — M79661 Pain in right lower leg: Secondary | ICD-10-CM | POA: Diagnosis present

## 2022-01-24 DIAGNOSIS — E876 Hypokalemia: Secondary | ICD-10-CM | POA: Diagnosis not present

## 2022-01-24 DIAGNOSIS — M79604 Pain in right leg: Secondary | ICD-10-CM

## 2022-01-24 DIAGNOSIS — Z5189 Encounter for other specified aftercare: Secondary | ICD-10-CM

## 2022-01-24 DIAGNOSIS — L97919 Non-pressure chronic ulcer of unspecified part of right lower leg with unspecified severity: Secondary | ICD-10-CM | POA: Insufficient documentation

## 2022-01-24 DIAGNOSIS — L03115 Cellulitis of right lower limb: Secondary | ICD-10-CM

## 2022-01-24 LAB — COMPREHENSIVE METABOLIC PANEL
ALT: 20 U/L (ref 0–44)
AST: 24 U/L (ref 15–41)
Albumin: 3.5 g/dL (ref 3.5–5.0)
Alkaline Phosphatase: 49 U/L (ref 38–126)
Anion gap: 7 (ref 5–15)
BUN: 9 mg/dL (ref 6–20)
CO2: 25 mmol/L (ref 22–32)
Calcium: 9.1 mg/dL (ref 8.9–10.3)
Chloride: 108 mmol/L (ref 98–111)
Creatinine, Ser: 0.7 mg/dL (ref 0.44–1.00)
GFR, Estimated: >60 mL/min (ref >60)
Glucose, Bld: 81 mg/dL (ref 70–99)
Potassium: 3.2 mmol/L — ABNORMAL LOW (ref 3.5–5.1)
Sodium: 140 mmol/L (ref 135–145)
Total Bilirubin: 0.4 mg/dL (ref 0.3–1.2)
Total Protein: 7.6 g/dL (ref 6.5–8.1)

## 2022-01-24 LAB — CBC WITH DIFFERENTIAL/PLATELET
Abs Immature Granulocytes: 0.01 10*3/uL (ref 0.00–0.07)
Basophils Absolute: 0 10*3/uL (ref 0.0–0.1)
Basophils Relative: 1 %
Eosinophils Absolute: 0.1 10*3/uL (ref 0.0–0.5)
Eosinophils Relative: 3 %
HCT: 37 % (ref 36.0–46.0)
Hemoglobin: 12.5 g/dL (ref 12.0–15.0)
Immature Granulocytes: 0 %
Lymphocytes Relative: 32 %
Lymphs Abs: 1.4 10*3/uL (ref 0.7–4.0)
MCH: 30 pg (ref 26.0–34.0)
MCHC: 33.8 g/dL (ref 30.0–36.0)
MCV: 88.7 fL (ref 80.0–100.0)
Monocytes Absolute: 0.6 10*3/uL (ref 0.1–1.0)
Monocytes Relative: 14 %
Neutro Abs: 2.2 10*3/uL (ref 1.7–7.7)
Neutrophils Relative %: 50 %
Platelets: 227 10*3/uL (ref 150–400)
RBC: 4.17 MIL/uL (ref 3.87–5.11)
RDW: 13.8 % (ref 11.5–15.5)
WBC: 4.3 10*3/uL (ref 4.0–10.5)
nRBC: 0 % (ref 0.0–0.2)

## 2022-01-24 LAB — HCG, QUANTITATIVE, PREGNANCY: hCG, Beta Chain, Quant, S: 1 m[IU]/mL (ref <5)

## 2022-01-24 LAB — SEDIMENTATION RATE: Sed Rate: 56 mm/hr — ABNORMAL HIGH (ref 0–22)

## 2022-01-24 LAB — LACTIC ACID, PLASMA: Lactic Acid, Venous: 1.2 mmol/L (ref 0.5–1.9)

## 2022-01-24 MED ORDER — HYDROCODONE-ACETAMINOPHEN 5-325 MG PO TABS
1.0000 | ORAL_TABLET | Freq: Once | ORAL | Status: AC
  Administered 2022-01-24: 1 via ORAL
  Filled 2022-01-24: qty 1

## 2022-01-24 MED ORDER — DOXYCYCLINE HYCLATE 100 MG PO CAPS: 100.0000 mg | ORAL_CAPSULE | Freq: Two times a day (BID) | ORAL | 0 refills | Status: AC

## 2022-01-24 MED ORDER — CEPHALEXIN 500 MG PO CAPS: 500.0000 mg | ORAL_CAPSULE | Freq: Four times a day (QID) | ORAL | 0 refills | Status: DC

## 2022-01-24 MED ORDER — POTASSIUM CHLORIDE CRYS ER 20 MEQ PO TBCR
40.0000 meq | EXTENDED_RELEASE_TABLET | Freq: Once | ORAL | Status: AC
  Administered 2022-01-24: 40 meq via ORAL
  Filled 2022-01-24: qty 2

## 2022-01-24 MED ORDER — OXYCODONE-ACETAMINOPHEN 5-325 MG PO TABS: 1.0000 | ORAL_TABLET | Freq: Four times a day (QID) | ORAL | 0 refills | Status: DC | PRN

## 2022-01-24 NOTE — Discharge Instructions (Addendum)
Please follow-up outpatient with dermatology for a punch biopsy of your wounds.  Please follow-up outpatient with the wound clinic for further chronic wound care.  Follow-up with rheumatology for management of your lupus.  Your wounds have some surrounding areas of redness and swelling concerning for developing cellulitis for which we will treat with antibiotics which have been prescribed for you.  Return for worsening redness, swelling, leg pain despite outpatient antibiotic use.  Your potassium was mildly low today which was replenished orally.

## 2022-01-24 NOTE — ED Provider Notes (Addendum)
[facility] EMERGENCY DEPARTMENT Provider Note   CSN: [id number] Arrival date & time: 01/24/22  1346     History  Chief Complaint  Patient presents with   Leg Pain   Wound Check    Kelly [name] is a 26 y.o. female.   Leg Pain Wound Check   26 year old female with medical history significant for lupus and antiphospholipid syndrome, follows outpatient with rheumatology, presenting with acute on chronic lower extremity wounds.  The patient states that she has been in wound care for the last 48 weeks and has had ulcerations on her left lower extremity that have been improving.  She states that she has developed new ulcerations in her right lower extremity with worsening pain and swelling and foul-smelling purulence.  She endorses some redness in the surrounding area and some pain in her right ankle.  She has an appointment on June 5 for biopsies with dermatology outpatient.  Home Medications Prior to Admission medications   Medication Sig Start Date End Date Taking? Authorizing Provider  cephALEXin (KEFLEX) 500 MG capsule Take 1 capsule (500 mg total) by mouth 4 (four) times daily. 01/24/22  Yes Regan Lemming, MD  doxycycline (VIBRAMYCIN) 100 MG capsule Take 1 capsule (100 mg total) by mouth 2 (two) times daily for 5 days. 01/24/22 01/29/22 Yes Regan Lemming, MD  oxyCODONE-acetaminophen (PERCOCET/ROXICET) 5-325 MG tablet Take 1 tablet by mouth every 6 (six) hours as needed for severe pain. 01/24/22  Yes Regan Lemming, MD  acetaminophen (TYLENOL) 500 MG tablet Take 1,000 mg by mouth every 6 (six) hours as needed for moderate pain or headache.    [provider]  Belimumab (BENLYSTA) 200 MG/ML SOAJ 1 ml 05/07/19   [provider]  hydroxychloroquine (PLAQUENIL) 200 MG tablet Take 300 mg by mouth daily.    [provider]  hydrOXYzine (VISTARIL) 100 MG capsule Take 1 capsule (100 mg total) by mouth at bedtime. 01/17/21   Naaman Plummer, MD   medroxyPROGESTERone (DEPO-PROVERA) 150 MG/ML injection INJECT 1 ML (150 MG TOTAL) INTO THE MUSCLE EVERY 3 (THREE) MONTHS 10/02/21   Constant, Vickii Chafe, MD  mycophenolate (CELLCEPT) 250 MG capsule Take by mouth 2 (two) times daily.    [provider]  predniSONE (DELTASONE) 10 MG tablet Take 10 mg by mouth daily. 07/04/21   [provider]      Allergies    Patient has no known allergies.    Review of Systems   Review of Systems  All other systems reviewed and are negative.  Physical Exam Updated Vital Signs BP (!) 146/105   Pulse 87   Temp 98.8 F (37.1 C) (Oral)   Resp 16   SpO2 100%  Physical Exam Vitals and nursing note reviewed.  Constitutional:      General: She is not in acute distress. HENT:     Head: Normocephalic and atraumatic.  Eyes:     Conjunctiva/sclera: Conjunctivae normal.     Pupils: Pupils are equal, round, and reactive to light.  Cardiovascular:     Rate and Rhythm: Normal rate and regular rhythm.  Pulmonary:     Effort: Pulmonary effort is normal. No respiratory distress.  Abdominal:     General: There is no distension.     Tenderness: There is no guarding.  Musculoskeletal:        General: No deformity or signs of injury.     Cervical back: Neck supple.     Comments: RLE with two deep wounds with  foul smelling purulence, 2+ DP pulses  Skin:    Findings: No lesion or rash.  Neurological:     General: No focal deficit present.     Mental Status: She is alert. Mental status is at baseline.      ED Results / Procedures / Treatments   Labs (all labs ordered are listed, but only abnormal results are displayed) Labs Reviewed  COMPREHENSIVE METABOLIC PANEL - Abnormal; Notable for the following components:      Result Value   Potassium 3.2 (*)    All other components within normal limits  SEDIMENTATION RATE - Abnormal; Notable for the following components:   Sed Rate 56 (*)    All other components within normal limits  CBC WITH  DIFFERENTIAL/PLATELET  LACTIC ACID, PLASMA  HCG, QUANTITATIVE, PREGNANCY  PREGNANCY, URINE    EKG None  Radiology DG Tibia/Fibula Right  Result Date: 01/24/2022 CLINICAL DATA:  Right lower extremity swelling.  History of lupus. EXAM: RIGHT TIBIA AND FIBULA - 2 VIEW COMPARISON:  None Available. FINDINGS: Extensive soft tissue calcifications are noted likely related to the patient's collagen vascular disease. Small open wounds are noted. The knee and ankle joints are maintained. The tibia and fibula are intact. IMPRESSION: Extensive soft tissue calcifications likely related to the patient's lupus. Small open wounds are noted. No significant bony findings. Electronically Signed   By: Marijo Sanes M.D.   On: 01/24/2022 15:54   VAS [place] LOWER EXTREMITY VENOUS (DVT) (ONLY [facility] & [facility])  Result Date: 01/24/2022  Lower Venous DVT Study Patient Name:  Kelly [name] [facility]  Date of Exam:   01/24/2022 Medical Rec #: [id number]           Accession #:    [id number] Date of Birth: [birth year]           Patient Gender: F Patient Age:   48 years Exam Location:  [facility] Procedure:      VAS [place] LOWER EXTREMITY VENOUS (DVT) Referring Phys: Regan Lemming --------------------------------------------------------------------------------  Indications: Pain.  Risk Factors: None identified. Limitations: Open wound and poor ultrasound/tissue interface. Comparison Study: No prior studies. Performing Technologist: Oliver Hum RVT  Examination Guidelines: A complete evaluation includes B-mode imaging, spectral Doppler, color Doppler, and power Doppler as needed of all accessible portions of each vessel. Bilateral testing is considered an integral part of a complete examination. Limited examinations for reoccurring indications may be performed as noted. The reflux portion of the exam is performed with the patient in reverse Trendelenburg.  +---------+---------------+---------+-----------+----------+--------------+ RIGHT     CompressibilityPhasicitySpontaneityPropertiesThrombus Aging +---------+---------------+---------+-----------+----------+--------------+ CFV      Full           Yes      Yes                                 +---------+---------------+---------+-----------+----------+--------------+ SFJ      Full                                                        +---------+---------------+---------+-----------+----------+--------------+ FV Prox  Full                                                        +---------+---------------+---------+-----------+----------+--------------+  FV Mid   Full           Yes      Yes                                 +---------+---------------+---------+-----------+----------+--------------+ FV DistalFull                                                        +---------+---------------+---------+-----------+----------+--------------+ PFV      Full                                                        +---------+---------------+---------+-----------+----------+--------------+ POP      Full           Yes      Yes                                 +---------+---------------+---------+-----------+----------+--------------+ PTV      Full                                                        +---------+---------------+---------+-----------+----------+--------------+ PERO     Full                                                        +---------+---------------+---------+-----------+----------+--------------+   +----+---------------+---------+-----------+----------+--------------+ LEFTCompressibilityPhasicitySpontaneityPropertiesThrombus Aging +----+---------------+---------+-----------+----------+--------------+ CFV Full           Yes      Yes                                 +----+---------------+---------+-----------+----------+--------------+    Summary: RIGHT: - There is no evidence of deep vein thrombosis in the lower  extremity. However, portions of this examination were limited- see technologist comments above.  - No cystic structure found in the popliteal fossa.  LEFT: - No evidence of common femoral vein obstruction.  *See table(s) above for measurements and observations.    Preliminary     Procedures Procedures    Medications Ordered in ED Medications  HYDROcodone-acetaminophen (NORCO/VICODIN) 5-325 MG per tablet 1 tablet (1 tablet Oral Given 01/24/22 1552)  potassium chloride SA (KLOR-CON M) CR tablet 40 mEq (40 mEq Oral Given 01/24/22 1642)    ED Course/ Medical Decision Making/ A&P Clinical Course as of 01/24/22 1757  Wed Jan 24, 2022  1707 WBC: 4.3 [JL]  1707 Potassium(!): 3.2 [JL]  1707 Lactic Acid, Venous: 1.2 [JL]    Clinical Course User Index [JL] Regan Lemming, MD                           Medical Decision Making  Amount and/or Complexity of Data Reviewed Labs: ordered. Decision-making details documented in ED Course. Radiology: ordered.  Risk Prescription drug management.    26 year old female with medical history significant for lupus and antiphospholipid syndrome, follows outpatient with rheumatology, presenting with acute on chronic lower extremity wounds.  The patient states that she has been in wound care for the last 48 weeks and has had ulcerations on her left lower extremity that have been improving.  She states that she has developed new ulcerations in her right lower extremity with worsening pain and swelling and foul-smelling purulence.  She endorses some redness in the surrounding area and some pain in her right ankle.  She has an appointment on June 5 for biopsies with dermatology outpatient.  On arrival, the patient was afebrile, hemodynamically stable, not tachycardic or tachypneic, saturating well on room air.  Sinus rhythm noted on cardiac telemetry.  Physical exam significant for 2 chronic appearing wounds that have some surrounding erythema and swelling  concerning for developing cellulitis.  The patient follows outpatient with wound care and has a punch biopsy scheduled with dermatology within the next week.  She also follows outpatient with rheumatology regarding her lupus.  No concern for lupus flare at this time.  No concern for abscess or necrotizing soft tissue infection.  DVT ultrasound performed to rule out DVT which ultimately resulted negative.  X-ray imaging of the lower extremity did not reveal evidence of fracture, foreign body or soft tissue gas.  Extensive soft tissue calcifications were noted which are likely related to the patient's lupus.  With small open wounds noted in the soft tissues.  Overall patient presentation concerning for chronic wounds with developing cellulitis.  We will treat the patient for cellulitis with Keflex and doxycycline and have the patient follow-up outpatient.   Final Clinical Impression(s) / ED Diagnoses Final diagnoses:  Cellulitis of right lower extremity  Right leg pain  Visit for wound check  Wound of right lower extremity, initial encounter  Hypokalemia    Rx / DC Orders ED Discharge Orders          Ordered    cephALEXin (KEFLEX) 500 MG capsule  4 times daily        01/24/22 1651    doxycycline (VIBRAMYCIN) 100 MG capsule  2 times daily        01/24/22 1651    oxyCODONE-acetaminophen (PERCOCET/ROXICET) 5-325 MG tablet  Every 6 hours PRN        01/24/22 1756              Regan Lemming, MD 01/24/22 1652    Regan Lemming, MD 01/24/22 1757

## 2022-01-24 NOTE — ED Provider Triage Note (Signed)
Emergency Medicine Provider Triage Evaluation Note  Kelly [name] , a 26 y.o. female  was evaluated in triage.  Pt complains of ulcerations to her lower legs.  Patient has SLE.  She states that she has been seen by rheumatology, wound care, and dermatology for the ulcerations on her legs.  She has been in wound care for the past 48 weeks.  The ulcerations are worsening and have become very painful.  She has an appoint with dermatology on June 5 to do biopsies of the wounds.  She is here today for possible pain control and further evaluation  Review of Systems  Positive: Leg ulcers Negative: Chest pain, shortness of breath  Physical Exam  BP (!) 132/103 (BP Location: Left Arm)   Pulse 95   Temp 98.8 F (37.1 C) (Oral)   Resp 17   SpO2 100%  Gen:   Awake, no distress   Resp:  Normal effort  MSK:   Moves extremities without difficulty  Other:    Medical Decision Making  Medically screening exam initiated at 2:28 PM.  Appropriate orders placed.  Kelly [name] was informed that the remainder of the evaluation will be completed by another provider, this initial triage assessment does not replace that evaluation, and the importance of remaining in the ED until their evaluation is complete.      Darrick Grinder, PA-C 01/24/22 1430

## 2022-01-24 NOTE — ED Triage Notes (Signed)
Pt here for increased pain and swelling to R lower leg. Pt has lupus and has 2 ulcers on medial and posterior R lower leg. Pt saw rheumatology 2 weeks ago and referred her to dermatology, she has an appointment in 2 weeks. Pt started having shooting pains from her R ankle up to her knee and swelling around the ulcers yesterday

## 2022-01-24 NOTE — Progress Notes (Signed)
Right lower extremity venous duplex has been completed. Preliminary results can be found in CV Proc through chart review.  Results were given to Dr. Armandina Gemma.  01/24/22 4:14 PM Kelly [name] RVT

## 2022-02-06 ENCOUNTER — Encounter (HOSPITAL_BASED_OUTPATIENT_CLINIC_OR_DEPARTMENT_OTHER): Payer: BC Managed Care – PPO | Attending: Internal Medicine | Admitting: Internal Medicine

## 2022-02-06 DIAGNOSIS — J45909 Unspecified asthma, uncomplicated: Secondary | ICD-10-CM | POA: Insufficient documentation

## 2022-02-06 DIAGNOSIS — M329 Systemic lupus erythematosus, unspecified: Secondary | ICD-10-CM | POA: Diagnosis not present

## 2022-02-06 DIAGNOSIS — L97822 Non-pressure chronic ulcer of other part of left lower leg with fat layer exposed: Secondary | ICD-10-CM | POA: Insufficient documentation

## 2022-02-06 DIAGNOSIS — L97812 Non-pressure chronic ulcer of other part of right lower leg with fat layer exposed: Secondary | ICD-10-CM | POA: Insufficient documentation

## 2022-02-08 NOTE — Progress Notes (Signed)
Kelly [name] ([id number]) Visit Report for 02/06/2022 Debridement Details Patient Name: Date of Service: Kelly [name], Kelly [name]. 02/06/2022 2:30 PM Medical Record Number: [id number] Patient Account Number: [id number] Date of Birth/Sex: Treating RN: December 20, 1995 (25 y.o. Kelly [name], Kelly [name] Primary Care Provider: Dione Booze Other Clinician: Referring Provider: Treating Provider/Extender: Gennaro Africa in Treatment: 51 Debridement Performed for Assessment: Wound #10 Right,Medial Lower Leg Performed By: Physician Geralyn Corwin, DO Debridement Type: Debridement Level of Consciousness (Pre-procedure): Awake and Alert Pre-procedure Verification/Time Out Yes - 16:30 Taken: Start Time: 16:30 Pain Control: Lidocaine T Area Debrided (L x W): otal 1 (cm) x 2.7 (cm) = 2.7 (cm) Tissue and other material debrided: Viable, Non-Viable, Slough, Subcutaneous, Slough Level: Skin/Subcutaneous Tissue Debridement Description: Excisional Instrument: Curette Bleeding: Minimum Hemostasis Achieved: Pressure End Time: 16:30 Procedural Pain: 0 Post Procedural Pain: 0 Response to Treatment: Procedure was tolerated well Level of Consciousness (Post- Awake and Alert procedure): Post Debridement Measurements of Total Wound Length: (cm) 1 Width: (cm) 2.7 Depth: (cm) 0.3 Volume: (cm) 0.636 Character of Wound/Ulcer Post Debridement: Improved Post Procedure Diagnosis Same as Pre-procedure Electronic Signature(s) Signed: 02/08/2022 10:51:54 AM By: Geralyn Corwin DO Signed: 02/08/2022 5:29:07 PM By: Fonnie Mu RN Entered By: Fonnie Mu on 02/06/2022 16:39:29 -------------------------------------------------------------------------------- Debridement Details Patient Name: Date of Service: Kelly Milling [name] 02/06/2022 2:30 PM Medical Record Number: [id number] Patient Account Number: [id number] Date of Birth/Sex: Treating RN: Apr 22, 1996 (25 y.o. Kelly [name], Kelly [name] Primary Care Provider: Dione Booze Other Clinician: Referring Provider: Treating Provider/Extender: Gennaro Africa in Treatment: 51 Debridement Performed for Assessment: Wound #11 Right,Proximal,Medial Lower Leg Performed By: Physician Geralyn Corwin, DO Debridement Type: Debridement Level of Consciousness (Pre-procedure): Awake and Alert Pre-procedure Verification/Time Out Yes - 16:30 Taken: Start Time: 16:30 Pain Control: Lidocaine T Area Debrided (L x W): otal 1.2 (cm) x 1.3 (cm) = 1.56 (cm) Tissue and other material debrided: Viable, Non-Viable, Slough, Subcutaneous, Slough Level: Skin/Subcutaneous Tissue Debridement Description: Excisional Instrument: Curette Bleeding: Minimum Hemostasis Achieved: Pressure End Time: 16:30 Procedural Pain: 0 Post Procedural Pain: 0 Response to Treatment: Procedure was tolerated well Level of Consciousness (Post- Awake and Alert procedure): Post Debridement Measurements of Total Wound Length: (cm) 1.2 Width: (cm) 1.3 Depth: (cm) 0.5 Volume: (cm) 0.613 Character of Wound/Ulcer Post Debridement: Improved Post Procedure Diagnosis Same as Pre-procedure Electronic Signature(s) Signed: 02/08/2022 10:51:54 AM By: Geralyn Corwin DO Signed: 02/08/2022 5:29:07 PM By: Fonnie Mu RN Entered By: Fonnie Mu on 02/06/2022 16:40:19 -------------------------------------------------------------------------------- Physician Orders Details Patient Name: Date of Service: Kelly Milling [name] 02/06/2022 2:30 PM Medical Record Number: [id number] Patient Account Number: [id number] Date of Birth/Sex: Treating RN: [birth year] (25 y.o. Kelly [name], Kelly [name] Primary Care Provider: Dione Booze Other Clinician: Referring Provider: Treating Provider/Extender: Gennaro Africa in Treatment: 11 Verbal / Phone Orders: No Diagnosis Coding Follow-up  Appointments ppointment in 2 weeks. - 02/20/22 @ 1345 Dr. Mikey Bussing and Maryruth Bun Room # 9 Return A Bathing/ Shower/ Hygiene May shower and wash wound with soap and water. Edema Control - Lymphedema / SCD / Other Elevate legs to the level of the heart or above for 30 minutes daily and/or when sitting, a frequency of: - 3-4 times a day throughout the day. Avoid standing for long periods of time. Additional Orders / Instructions Follow Nutritious Diet Wound Treatment Wound #10 - Lower Leg Wound Laterality: Right, Medial Cleanser: Soap and Water 2 x Per Day/15 Days Discharge Instructions: May shower and wash wound with  dial antibacterial soap and water prior to dressing change. Cleanser: Wound Cleanser (Generic) 2 x Per Day/15 Days Discharge Instructions: Cleanse the wound with wound cleanser prior to applying a clean dressing using gauze sponges, not tissue or cotton balls. Peri-Wound Care: Skin Prep (Generic) 2 x Per Day/15 Days Discharge Instructions: Use skin prep as directed Prim Dressing: KerraCel Ag Gelling Fiber Dressing, 4x5 in (silver alginate) (DME) (Generic) 2 x Per Day/15 Days ary Discharge Instructions: Apply silver alginate to wound bed as instructed Prim Dressing: MediHoney Gel, tube 1.5 (oz) 2 x Per Day/15 Days ary Discharge Instructions: Apply to wound bed as instructed Secondary Dressing: Woven Gauze Sponge, Non-Sterile 4x4 in (Generic) 2 x Per Day/15 Days Discharge Instructions: Apply over primary dressing as directed. Secondary Dressing: Zetuvit Plus Silicone Border Dressing 4x4 (in/in) (DME) (Generic) 2 x Per Day/15 Days Discharge Instructions: Apply silicone border over primary dressing as directed. Wound #11 - Lower Leg Wound Laterality: Right, Medial, Proximal Cleanser: Soap and Water 2 x Per Day/15 Days Discharge Instructions: May shower and wash wound with dial antibacterial soap and water prior to dressing change. Cleanser: Wound Cleanser (Generic) 2 x Per Day/15  Days Discharge Instructions: Cleanse the wound with wound cleanser prior to applying a clean dressing using gauze sponges, not tissue or cotton balls. Peri-Wound Care: Skin Prep (Generic) 2 x Per Day/15 Days Discharge Instructions: Use skin prep as directed Prim Dressing: KerraCel Ag Gelling Fiber Dressing, 4x5 in (silver alginate) (DME) (Generic) 2 x Per Day/15 Days ary Discharge Instructions: Apply silver alginate to wound bed as instructed Prim Dressing: MediHoney Gel, tube 1.5 (oz) 2 x Per Day/15 Days ary Discharge Instructions: Apply to wound bed as instructed Secondary Dressing: Woven Gauze Sponge, Non-Sterile 4x4 in (Generic) 2 x Per Day/15 Days Discharge Instructions: Apply over primary dressing as directed. Secondary Dressing: Zetuvit Plus Silicone Border Dressing 4x4 (in/in) (DME) (Generic) 2 x Per Day/15 Days Discharge Instructions: Apply silicone border over primary dressing as directed. Electronic Signature(s) Signed: 02/08/2022 10:51:54 AM By: Geralyn Corwin DO Signed: 02/08/2022 5:29:07 PM By: Fonnie Mu RN Entered By: Fonnie Mu on 02/06/2022 16:41:23 -------------------------------------------------------------------------------- SuperBill Details Patient Name: Date of Service: Kelly Milling [name] 02/06/2022 Medical Record Number: [id number] Patient Account Number: [id number] Date of Birth/Sex: Treating RN: 27-Nov-1995 (25 y.o. Kelly [name], Kelly [name] Primary Care Provider: Dione Booze Other Clinician: Referring Provider: Treating Provider/Extender: Gennaro Africa in Treatment: 51 Diagnosis Coding ICD-10 Codes Code Description [phone] Non-pressure chronic ulcer of other part of left lower leg with fat layer exposed L97.812 Non-pressure chronic ulcer of other part of right lower leg with fat layer exposed M32.9 Systemic lupus erythematosus, unspecified Facility Procedures CPT4 Code: [id number] Description: 11042 - DEB SUBQ  TISSUE 20 SQ CM/< ICD-10 Diagnosis Description L97.822 Non-pressure chronic ulcer of other part of left lower leg with fat layer exposed L97.812 Non-pressure chronic ulcer of other part of right lower leg with fat layer  expose Modifier: d Quantity: 1 Physician Procedures : CPT4 Code Description Modifier [id number] 11042 - WC PHYS SUBQ TISS 20 SQ CM ICD-10 Diagnosis Description L97.822 Non-pressure chronic ulcer of other part of left lower leg with fat layer exposed L97.812 Non-pressure chronic ulcer of other part of right  lower leg with fat layer exposed Quantity: 1 Electronic Signature(s) Signed: 02/08/2022 10:51:54 AM By: Geralyn Corwin DO Signed: 02/08/2022 5:29:07 PM By: Fonnie Mu RN Entered By: Fonnie Mu on 02/06/2022 16:42:05

## 2022-02-08 NOTE — Progress Notes (Signed)
ADDELYN, ALLEMAN ([id number]) Visit Report for 02/06/2022 Arrival Information Details Patient Name: Date of Service: JEWELINE, REIF. 02/06/2022 2:30 PM Medical Record Number: [id number] Patient Account Number: [id number] Date of Birth/Sex: Treating RN: 1996/01/21 (26 y.o. [name], [name] Primary Care Santhosh Gulino: [name] Other Clinician: Referring Lemario Chaikin: Treating Saif Peter/Extender: Elsworth Soho in Treatment: 72 Visit Information History Since Last Visit Added or deleted any medications: No Patient Arrived: Ambulatory Any new allergies or adverse reactions: No Arrival Time: 15:03 Had a fall or experienced change in No Accompanied By: self activities of daily living that may affect Transfer Assistance: None risk of falls: Patient Identification Verified: Yes Signs or symptoms of abuse/neglect since last visito No Secondary Verification Process Completed: Yes Hospitalized since last visit: No Patient Requires Transmission-Based Precautions: No Implantable device outside of the clinic excluding No Patient Has Alerts: Yes cellular tissue based products placed in the center Patient Alerts: Left ABi=1.18 since last visit: Has Dressing in Place as Prescribed: Yes Pain Present Now: No Electronic Signature(s) Signed: 02/08/2022 5:29:07 PM By: Rhae Hammock RN Entered By: Rhae Hammock on 02/06/2022 15:07:56 -------------------------------------------------------------------------------- Encounter Discharge Information Details Patient Name: Date of Service: [name] 02/06/2022 2:30 PM Medical Record Number: [id number] Patient Account Number: [id number] Date of Birth/Sex: Treating RN: [birth year] (26 y.o. [name], [name] Primary Care Dailynn Nancarrow: [name] Other Clinician: Referring Kenzo Ozment: Treating Preston Garabedian/Extender: Elsworth Soho in Treatment: 51 Encounter Discharge Information  Items Post Procedure Vitals Discharge Condition: Stable Temperature (F): 98.7 Ambulatory Status: Ambulatory Pulse (bpm): 74 Discharge Destination: Home Respiratory Rate (breaths/min): 17 Transportation: Private Auto Blood Pressure (mmHg): 134/74 Accompanied By: self Schedule Follow-up Appointment: Yes Clinical Summary of Care: Patient Declined Electronic Signature(s) Signed: 02/08/2022 5:29:07 PM By: Rhae Hammock RN Entered By: Rhae Hammock on 02/06/2022 16:42:56 -------------------------------------------------------------------------------- Lower Extremity Assessment Details Patient Name: Date of Service: [name] 02/06/2022 2:30 PM Medical Record Number: [id number] Patient Account Number: [id number] Date of Birth/Sex: Treating RN: [birth year] (26 y.o. [name], [name] Primary Care Jadarius Commons: [name] Other Clinician: Referring Shevelle Smither: Treating Mailee Klaas/Extender: Elsworth Soho in Treatment: 51 Edema Assessment Assessed: Shirlyn Goltz: No] Patrice Paradise: No] Edema: [Left: Yes] [Right: Yes] Calf Left: Right: Point of Measurement: 27 cm From Medial Instep 34.5 cm 33 cm Ankle Left: Right: Point of Measurement: 8 cm From Medial Instep 21.5 cm 22 cm Vascular Assessment Pulses: Dorsalis Pedis Palpable: [Left:Yes] [Right:Yes] Posterior Tibial Palpable: [Left:Yes] [Right:Yes] Electronic Signature(s) Signed: 02/08/2022 5:29:07 PM By: Rhae Hammock RN Entered By: Rhae Hammock on 02/06/2022 15:34:30 -------------------------------------------------------------------------------- Multi-Disciplinary Care Plan Details Patient Name: Date of Service: [name] 02/06/2022 2:30 PM Medical Record Number: [id number] Patient Account Number: [id number] Date of Birth/Sex: Treating RN: 28-Feb-1996 (26 y.o. [name], [name] Primary Care Neasia Fleeman: [name] Other Clinician: Referring Lynden Carrithers: Treating  Keilani Terrance/Extender: Elsworth Soho in Treatment: [place] reviewed with physician Active Inactive Wound/Skin Impairment Nursing Diagnoses: Impaired tissue integrity Goals: Patient/caregiver will verbalize understanding of skin care regimen Date Initiated: 02/13/2021 Target Resolution Date: 02/10/2022 Goal Status: Active Ulcer/skin breakdown will have a volume reduction of 30% by week 4 Date Initiated: 02/13/2021 Date Inactivated: 04/21/2021 Target Resolution Date: 03/13/2021 Unmet Reason: pt did not return to Goal Status: Unmet clinic for F/U Ulcer/skin breakdown will have a volume reduction of 80% by week 12 Date Initiated: 04/21/2021 Date Inactivated: 07/13/2021 Target Resolution Date: 07/27/2021 Goal Status: Met Interventions: Assess patient/caregiver ability to obtain necessary  supplies Assess patient/caregiver ability to perform ulcer/skin care regimen upon admission and as needed Assess ulceration(s) every visit Provide education on ulcer and skin care Treatment Activities: Skin care regimen initiated : 02/13/2021 Topical wound management initiated : 02/13/2021 Notes: 06/29/21: Wounds 3 and 6 only at 80% volume reduction. Target date extended. Electronic Signature(s) Signed: 02/08/2022 5:29:07 PM By: Rhae Hammock RN Entered By: Rhae Hammock on 02/06/2022 16:41:30 -------------------------------------------------------------------------------- Pain Assessment Details Patient Name: Date of Service: [name] 02/06/2022 2:30 PM Medical Record Number: [id number] Patient Account Number: [id number] Date of Birth/Sex: Treating RN: 1995/10/19 (26 y.o. [name], [name] Primary Care Damier Disano: [name] Other Clinician: Referring Sidney Silberman: Treating Lynx Goodrich/Extender: Elsworth Soho in Treatment: 51 Active Problems Location of Pain Severity and Description of Pain Patient Has  Paino Yes Site Locations Pain Location: Pain in Ulcers With Dressing Change: Yes Duration of the Pain. Constant / Intermittento Intermittent Rate the pain. Current Pain Level: 10 Worst Pain Level: 10 Least Pain Level: 0 Tolerable Pain Level: 10 Character of Pain Describe the Pain: Aching Pain Management and Medication Current Pain Management: Medication: No Cold Application: No Rest: No Massage: No Activity: No T.E.N.S.: No Heat Application: No Leg drop or elevation: No Is the Current Pain Management Adequate: Adequate How does your wound impact your activities of daily livingo Sleep: No Bathing: No Appetite: No Relationship With Others: No Bladder Continence: No Emotions: No Bowel Continence: No Work: No Toileting: No Drive: No Dressing: No Hobbies: No Electronic Signature(s) Signed: 02/08/2022 5:29:07 PM By: Rhae Hammock RN Entered By: Rhae Hammock on 02/06/2022 15:11:12 -------------------------------------------------------------------------------- Patient/Caregiver Education Details Patient Name: Date of Service: Fransico Setters 6/13/2023andnbsp2:30 PM Medical Record Number: [id number] Patient Account Number: [id number] Date of Birth/Gender: Treating RN: [birth year] (26 y.o. [name], [name] Primary Care Physician: [name] Other Clinician: Referring Physician: Treating Physician/Extender: Elsworth Soho in Treatment: 68 Education Assessment Education Provided To: Patient Education Topics Provided Wound/Skin Impairment: Methods: Explain/Verbal Responses: Reinforcements needed, State content correctly [employer]) Signed: 02/08/2022 5:29:07 PM By: Rhae Hammock RN Entered By: Rhae Hammock on 02/06/2022 16:41:44 -------------------------------------------------------------------------------- Wound Assessment Details Patient Name: Date of Service: [name]  02/06/2022 2:30 PM Medical Record Number: [id number] Patient Account Number: [id number] Date of Birth/Sex: Treating RN: Jun 20, 1996 (26 y.o. [name], [name] Primary Care Fumio Vandam: [name] Other Clinician: Referring Aahil Fredin: Treating Tawney Vanorman/Extender: Elsworth Soho in Treatment: 51 Wound Status Wound Number: 10 Primary Etiology: Lupus Wound Location: Right, Medial Lower Leg Wound Status: Open Wounding Event: Gradually Appeared Comorbid History: Asthma, Lupus Erythematosus Date Acquired: 12/18/2021 Weeks Of Treatment: 7 Clustered Wound: Yes Wound Measurements Length: (cm) 1 Width: (cm) 2.7 Depth: (cm) 0.3 Area: (cm) 2.121 Volume: (cm) 0.636 % Reduction in Area: 82.9% % Reduction in Volume: 82.9% Epithelialization: None Tunneling: No Undermining: No Wound Description Classification: Full Thickness With Exposed Support Structures Wound Margin: Distinct, outline attached Exudate Amount: Medium Exudate Type: Serosanguineous Exudate Color: red, brown Foul Odor After Cleansing: No Slough/Fibrino Yes Wound Bed Granulation Amount: None Present (0%) Exposed Structure Necrotic Amount: Large (67-100%) Fascia Exposed: No Necrotic Quality: Eschar, Adherent Slough Fat Layer (Subcutaneous Tissue) Exposed: Yes Tendon Exposed: No Muscle Exposed: No Joint Exposed: No Bone Exposed: No Treatment Notes Wound #10 (Lower Leg) Wound Laterality: Right, Medial Cleanser Soap and Water Discharge Instruction: May shower and wash wound with dial antibacterial soap and water prior to dressing change. Wound Cleanser Discharge Instruction: Cleanse the wound with wound  cleanser prior to applying a clean dressing using gauze sponges, not tissue or cotton balls. Peri-Wound Care Skin Prep Discharge Instruction: Use skin prep as directed Topical Primary Dressing KerraCel Ag Gelling Fiber Dressing, 4x5 in (silver alginate) Discharge Instruction: Apply  silver alginate to wound bed as instructed MediHoney Gel, tube 1.5 (oz) Discharge Instruction: Apply to wound bed as instructed Secondary Dressing Woven Gauze Sponge, Non-Sterile 4x4 in Discharge Instruction: Apply over primary dressing as directed. Zetuvit Plus Silicone Border Dressing 4x4 (in/in) Discharge Instruction: Apply silicone border over primary dressing as directed. Secured With Compression Wrap Compression Stockings Environmental education officer) Signed: 02/08/2022 5:29:07 PM By: Rhae Hammock RN Entered By: Rhae Hammock on 02/06/2022 16:02:57 -------------------------------------------------------------------------------- Wound Assessment Details Patient Name: Date of Service: [name] 02/06/2022 2:30 PM Medical Record Number: [id number] Patient Account Number: [id number] Date of Birth/Sex: Treating RN: 11/05/1995 (26 y.o. [name], [name] Primary Care Kalif Kattner: [name] Other Clinician: Referring Daziyah Cogan: Treating Aleksa Collinsworth/Extender: Elsworth Soho in Treatment: 9 Wound Status Wound Number: 11 Primary Etiology: Lupus Wound Location: Right, Proximal, Medial Lower Leg Wound Status: Open Wounding Event: Gradually Appeared Comorbid History: Asthma, Lupus Erythematosus Date Acquired: 12/25/2021 Weeks Of Treatment: 5 Clustered Wound: No Photos Wound Measurements Length: (cm) 1.2 Width: (cm) 1.3 Depth: (cm) 0.5 Area: (cm) 1.225 Volume: (cm) 0.613 % Reduction in Area: -1625.4% % Reduction in Volume: -2819% Epithelialization: None Tunneling: No Undermining: No Wound Description Classification: Full Thickness With Exposed Support Structures Wound Margin: Distinct, outline attached Exudate Amount: Medium Exudate Type: Serosanguineous Exudate Color: red, brown Foul Odor After Cleansing: No Slough/Fibrino Yes Wound Bed Granulation Amount: Medium (34-66%) Exposed Structure Granulation Quality: Red,  Pink Fascia Exposed: No Necrotic Amount: Medium (34-66%) Fat Layer (Subcutaneous Tissue) Exposed: No Necrotic Quality: Adherent Slough Tendon Exposed: No Muscle Exposed: No Joint Exposed: No Bone Exposed: No Treatment Notes Wound #11 (Lower Leg) Wound Laterality: Right, Medial, Proximal Cleanser Soap and Water Discharge Instruction: May shower and wash wound with dial antibacterial soap and water prior to dressing change. Wound Cleanser Discharge Instruction: Cleanse the wound with wound cleanser prior to applying a clean dressing using gauze sponges, not tissue or cotton balls. Peri-Wound Care Skin Prep Discharge Instruction: Use skin prep as directed Topical Primary Dressing KerraCel Ag Gelling Fiber Dressing, 4x5 in (silver alginate) Discharge Instruction: Apply silver alginate to wound bed as instructed MediHoney Gel, tube 1.5 (oz) Discharge Instruction: Apply to wound bed as instructed Secondary Dressing Woven Gauze Sponge, Non-Sterile 4x4 in Discharge Instruction: Apply over primary dressing as directed. Zetuvit Plus Silicone Border Dressing 4x4 (in/in) Discharge Instruction: Apply silicone border over primary dressing as directed. Secured With Compression Wrap Compression Stockings Environmental education officer) Signed: 02/08/2022 5:29:07 PM By: Rhae Hammock RN Entered By: Rhae Hammock on 02/06/2022 15:41:41 -------------------------------------------------------------------------------- Vitals Details Patient Name: Date of Service: [name] 02/06/2022 2:30 PM Medical Record Number: [id number] Patient Account Number: [id number] Date of Birth/Sex: Treating RN: Nov 06, 1995 (26 y.o. [name], [name] Primary Care Saber Dickerman: [name] Other Clinician: Referring Reice Bienvenue: Treating Shaima Sardinas/Extender: Elsworth Soho in Treatment: 51 Vital Signs Time Taken: 15:08 Temperature (F): 98.7 Height (in): 62 Pulse  (bpm): 96 Respiratory Rate (breaths/min): 17 Blood Pressure (mmHg): 148/107 Reference Range: 80 - 120 mg / dl Electronic Signature(s) Signed: 02/08/2022 5:29:07 PM By: Rhae Hammock RN Entered By: Rhae Hammock on 02/06/2022 15:08:58

## 2022-02-14 ENCOUNTER — Encounter (HOSPITAL_COMMUNITY): Payer: Self-pay | Admitting: Pharmacy Technician

## 2022-02-14 ENCOUNTER — Emergency Department (HOSPITAL_COMMUNITY)
Admission: EM | Admit: 2022-02-14 | Discharge: 2022-02-15 | Disposition: A | Discharge status: Home / Self Care | Payer: BC Managed Care – PPO | Attending: Emergency Medicine | Admitting: Emergency Medicine

## 2022-02-14 ENCOUNTER — Ambulatory Visit: Payer: Self-pay | Admitting: *Deleted

## 2022-02-14 ENCOUNTER — Other Ambulatory Visit: Payer: Self-pay

## 2022-02-14 DIAGNOSIS — L03115 Cellulitis of right lower limb: Secondary | ICD-10-CM | POA: Diagnosis not present

## 2022-02-14 DIAGNOSIS — Z4800 Encounter for change or removal of nonsurgical wound dressing: Secondary | ICD-10-CM | POA: Diagnosis not present

## 2022-02-14 DIAGNOSIS — M79661 Pain in right lower leg: Secondary | ICD-10-CM | POA: Diagnosis present

## 2022-02-14 LAB — COMPREHENSIVE METABOLIC PANEL
ALT: 16 U/L (ref 0–44)
AST: 20 U/L (ref 15–41)
Albumin: 3.3 g/dL — ABNORMAL LOW (ref 3.5–5.0)
Alkaline Phosphatase: 49 U/L (ref 38–126)
Anion gap: 9 (ref 5–15)
BUN: 13 mg/dL (ref 6–20)
CO2: 24 mmol/L (ref 22–32)
Calcium: 8.5 mg/dL — ABNORMAL LOW (ref 8.9–10.3)
Chloride: 104 mmol/L (ref 98–111)
Creatinine, Ser: 0.61 mg/dL (ref 0.44–1.00)
GFR, Estimated: >60 mL/min (ref >60)
Glucose, Bld: 96 mg/dL (ref 70–99)
Potassium: 3.5 mmol/L (ref 3.5–5.1)
Sodium: 137 mmol/L (ref 135–145)
Total Bilirubin: 0.5 mg/dL (ref 0.3–1.2)
Total Protein: 7.4 g/dL (ref 6.5–8.1)

## 2022-02-14 LAB — CBC
HCT: 35.7 % — ABNORMAL LOW (ref 36.0–46.0)
Hemoglobin: 12.2 g/dL (ref 12.0–15.0)
MCH: 30.2 pg (ref 26.0–34.0)
MCHC: 34.2 g/dL (ref 30.0–36.0)
MCV: 88.4 fL (ref 80.0–100.0)
Platelets: 282 10*3/uL (ref 150–400)
RBC: 4.04 MIL/uL (ref 3.87–5.11)
RDW: 13.6 % (ref 11.5–15.5)
WBC: 10.2 10*3/uL (ref 4.0–10.5)
nRBC: 0 % (ref 0.0–0.2)

## 2022-02-14 LAB — I-STAT BETA HCG BLOOD, ED (MC, WL, AP ONLY): I-stat hCG, quantitative: <5 m[IU]/mL (ref <5)

## 2022-02-14 MED ORDER — OXYCODONE-ACETAMINOPHEN 5-325 MG PO TABS
1.0000 | ORAL_TABLET | Freq: Once | ORAL | Status: AC
  Administered 2022-02-14: 1 via ORAL
  Filled 2022-02-14: qty 1

## 2022-02-14 NOTE — Telephone Encounter (Signed)
  Chief Complaint: Leg wounds Symptoms: H/O lupus. Has 2 open wounds on lower right leg, draining "Not new" painful. Redness has spread for ankle to shin, can barely walk. 10/10 pain. Anxious, tearful Frequency: months,pain worsening Pertinent Negatives: Patient denies fever Disposition: [] ED /[] Urgent Care (no appt availability in office) / [] Appointment(In office/virtual)/ []  [facility]/ [] Home Care/ [] Refused Recommended Disposition /[] [facility]/ [x]  Follow-up with PCP Additional Notes: During call [facility] called pt. Husband took call. Advised pt to speak with them. States the nurse there will call pt back. Advised to follow their recommendation, ED if they can not see her today. Call back if needed. Pt verbalizes understanding. Reason for Disposition  [1] Skin around the wound has become red AND [2] larger than 2 inches (5 cm)  Answer Assessment - Initial Assessment Questions 1. LOCATION: "Where is the wound located?"      Lower right leg, 2 wounds 2. WOUND APPEARANCE: "What does the wound look like?"      Draining red ankle to shin, tender 3. SIZE: If redness is present, ask: "What is the size of the red area?" (Inches, centimeters, or compare to size of a coin)      From ankle to shin 4. SPREAD: "What's changed in the last day?"  "Do you see any red streaks coming from the wound?"     Spreading 5. ONSET: "When did it start to look infected?"      Months ago 6. MECHANISM: "How did the wound start, what was the cause?"     "Lupus" 7. PAIN: "Is there any pain?" If Yes, ask: "How bad is the pain?"   (Scale 1-10; or mild, moderate, severe)     10/10 8. FEVER: "Do you have a fever?" If Yes, ask: "What is your temperature, how was it measured, and when did it start?"     No 9. OTHER SYMPTOMS: "Do you have any other symptoms?" (e.g., shaking chills, weakness, rash elsewhere on body)     Anxious.  Protocols used: Wound Infection-A-AH

## 2022-02-14 NOTE — ED Provider Triage Note (Cosign Needed)
Emergency Medicine Provider Triage Evaluation Note  USHA SLAGER , a 26 y.o. female  was evaluated in triage.  Pt complains of leg wounds.  Review of Systems  Positive: Leg pain Negative: Fever  Physical Exam  BP (!) 165/113 (BP Location: Right Arm)   Pulse (!) 106   Temp 99.2 F (37.3 C) (Oral)   Resp 15   SpO2 100%  Gen:   Awake, uncomfortable Resp:  Normal effort.. MSK:   Moves extremities without difficulty. Other:  Right lower extremity with wounds  Medical Decision Making  Medically screening exam initiated at 6:55 PM.  Appropriate orders placed.  SHIZUYE RUPERT was informed that the remainder of the evaluation will be completed by another provider, this initial triage assessment does not replace that evaluation, and the importance of remaining in the ED until their evaluation is complete.  [address]   [place]a [place], [place] 02/15/22 [phone]

## 2022-02-14 NOTE — ED Triage Notes (Signed)
Pt here with wounds to RLE. Followed by rheumatology, dermatology and wound care for same. States wounds have been draining, hot to the touch and increased inflammation. Pt tearful in triage.

## 2022-02-15 ENCOUNTER — Encounter (HOSPITAL_BASED_OUTPATIENT_CLINIC_OR_DEPARTMENT_OTHER): Payer: BC Managed Care – PPO | Admitting: Internal Medicine

## 2022-02-15 DIAGNOSIS — L03115 Cellulitis of right lower limb: Secondary | ICD-10-CM | POA: Diagnosis not present

## 2022-02-15 MED ORDER — OXYCODONE-ACETAMINOPHEN 5-325 MG PO TABS: 1.0000 | ORAL_TABLET | Freq: Four times a day (QID) | ORAL | 0 refills | Status: DC | PRN

## 2022-02-15 MED ORDER — DOXYCYCLINE HYCLATE 100 MG PO CAPS
100.0000 mg | ORAL_CAPSULE | Freq: Two times a day (BID) | ORAL | 0 refills | Status: DC
Start: 2022-02-15 — End: 2022-02-18

## 2022-02-15 MED ORDER — VANCOMYCIN HCL IN DEXTROSE 1-5 GM/200ML-% IV SOLN
1000.0000 mg | Freq: Once | INTRAVENOUS | Status: AC
  Administered 2022-02-15: 1000 mg via INTRAVENOUS
  Filled 2022-02-15: qty 200

## 2022-02-15 MED ORDER — HYDROMORPHONE HCL 1 MG/ML IJ SOLN
1.0000 mg | Freq: Once | INTRAMUSCULAR | Status: AC
  Administered 2022-02-15: 1 mg via INTRAVENOUS
  Filled 2022-02-15: qty 1

## 2022-02-15 MED ORDER — HYDROMORPHONE HCL 1 MG/ML IJ SOLN
0.5000 mg | Freq: Once | INTRAMUSCULAR | Status: AC
  Administered 2022-02-15: 0.5 mg via INTRAVENOUS
  Filled 2022-02-15: qty 1

## 2022-02-15 NOTE — ED Notes (Signed)
RN reviewed discharge instructions with pt. Pt verbalized understanding and had no further questions. VSS upon discharge.  

## 2022-02-15 NOTE — ED Notes (Signed)
Patient was given a cup of ice water. 

## 2022-02-18 ENCOUNTER — Other Ambulatory Visit: Payer: Self-pay

## 2022-02-18 ENCOUNTER — Emergency Department (HOSPITAL_COMMUNITY)
Admission: EM | Admit: 2022-02-18 | Discharge: 2022-02-18 | Payer: BC Managed Care – PPO | Attending: Emergency Medicine | Admitting: Emergency Medicine

## 2022-02-18 ENCOUNTER — Encounter (HOSPITAL_COMMUNITY): Payer: Self-pay | Admitting: Emergency Medicine

## 2022-02-18 DIAGNOSIS — L511 Stevens-Johnson syndrome: Secondary | ICD-10-CM | POA: Insufficient documentation

## 2022-02-18 DIAGNOSIS — L03115 Cellulitis of right lower limb: Secondary | ICD-10-CM | POA: Diagnosis present

## 2022-02-18 DIAGNOSIS — X58XXXD Exposure to other specified factors, subsequent encounter: Secondary | ICD-10-CM | POA: Insufficient documentation

## 2022-02-18 DIAGNOSIS — S81801D Unspecified open wound, right lower leg, subsequent encounter: Secondary | ICD-10-CM | POA: Insufficient documentation

## 2022-02-18 LAB — COMPREHENSIVE METABOLIC PANEL
ALT: 22 U/L (ref 0–44)
AST: 43 U/L — ABNORMAL HIGH (ref 15–41)
Albumin: 3.5 g/dL (ref 3.5–5.0)
Alkaline Phosphatase: 47 U/L (ref 38–126)
Anion gap: 7 (ref 5–15)
BUN: 11 mg/dL (ref 6–20)
CO2: 25 mmol/L (ref 22–32)
Calcium: 9 mg/dL (ref 8.9–10.3)
Chloride: 106 mmol/L (ref 98–111)
Creatinine, Ser: 0.61 mg/dL (ref 0.44–1.00)
GFR, Estimated: >60 mL/min (ref >60)
Glucose, Bld: 88 mg/dL (ref 70–99)
Potassium: 4.1 mmol/L (ref 3.5–5.1)
Sodium: 138 mmol/L (ref 135–145)
Total Bilirubin: 1.7 mg/dL — ABNORMAL HIGH (ref 0.3–1.2)
Total Protein: 8.6 g/dL — ABNORMAL HIGH (ref 6.5–8.1)

## 2022-02-18 LAB — CBC WITH DIFFERENTIAL/PLATELET
Abs Immature Granulocytes: 0.04 10*3/uL (ref 0.00–0.07)
Basophils Absolute: 0 10*3/uL (ref 0.0–0.1)
Basophils Relative: 0 %
Eosinophils Absolute: 0 10*3/uL (ref 0.0–0.5)
Eosinophils Relative: 1 %
HCT: 36.8 % (ref 36.0–46.0)
Hemoglobin: 12.3 g/dL (ref 12.0–15.0)
Immature Granulocytes: 1 %
Lymphocytes Relative: 18 %
Lymphs Abs: 1.1 10*3/uL (ref 0.7–4.0)
MCH: 29.5 pg (ref 26.0–34.0)
MCHC: 33.4 g/dL (ref 30.0–36.0)
MCV: 88.2 fL (ref 80.0–100.0)
Monocytes Absolute: 1.1 10*3/uL — ABNORMAL HIGH (ref 0.1–1.0)
Monocytes Relative: 17 %
Neutro Abs: 4 10*3/uL (ref 1.7–7.7)
Neutrophils Relative %: 63 %
Platelets: 324 10*3/uL (ref 150–400)
RBC: 4.17 MIL/uL (ref 3.87–5.11)
RDW: 13.4 % (ref 11.5–15.5)
WBC: 6.3 10*3/uL (ref 4.0–10.5)
nRBC: 0 % (ref 0.0–0.2)

## 2022-02-18 MED ORDER — HYDROMORPHONE HCL 1 MG/ML IJ SOLN
0.5000 mg | Freq: Once | INTRAMUSCULAR | Status: AC
  Administered 2022-02-18: 0.5 mg via INTRAVENOUS
  Filled 2022-02-18: qty 1

## 2022-02-18 MED ORDER — HYDROMORPHONE HCL 1 MG/ML IJ SOLN
1.0000 mg | Freq: Once | INTRAMUSCULAR | Status: AC
  Administered 2022-02-18: 1 mg via INTRAVENOUS
  Filled 2022-02-18: qty 1

## 2022-02-18 MED ORDER — ONDANSETRON HCL 4 MG/2ML IJ SOLN: 4.0000 mg | Freq: Four times a day (QID) | INTRAMUSCULAR | Status: DC | PRN

## 2022-02-18 MED ORDER — SODIUM CHLORIDE 0.9 % IV BOLUS
1000.0000 mL | Freq: Once | INTRAVENOUS | Status: AC
  Administered 2022-02-18: 1000 mL via INTRAVENOUS

## 2022-02-18 MED ORDER — ONDANSETRON HCL 4 MG/2ML IJ SOLN
4.0000 mg | Freq: Once | INTRAMUSCULAR | Status: AC
  Administered 2022-02-18: 4 mg via INTRAVENOUS
  Filled 2022-02-18: qty 2

## 2022-02-18 MED ORDER — HYDROMORPHONE HCL 1 MG/ML IJ SOLN
0.5000 mg | INTRAMUSCULAR | Status: DC | PRN
  Administered 2022-02-18: 0.5 mg via INTRAVENOUS
  Filled 2022-02-18: qty 1

## 2022-02-18 MED ORDER — HYDROMORPHONE HCL 1 MG/ML IJ SOLN: 0.5000 mg | Freq: Once | INTRAMUSCULAR | Status: DC

## 2022-02-18 NOTE — ED Notes (Signed)
Pt given cup of ice chips 

## 2022-02-20 ENCOUNTER — Encounter (HOSPITAL_BASED_OUTPATIENT_CLINIC_OR_DEPARTMENT_OTHER): Payer: BC Managed Care – PPO | Admitting: Internal Medicine

## 2022-03-05 ENCOUNTER — Encounter (HOSPITAL_BASED_OUTPATIENT_CLINIC_OR_DEPARTMENT_OTHER): Payer: BC Managed Care – PPO | Attending: Internal Medicine | Admitting: Internal Medicine

## 2022-03-05 DIAGNOSIS — M329 Systemic lupus erythematosus, unspecified: Secondary | ICD-10-CM | POA: Diagnosis not present

## 2022-03-05 DIAGNOSIS — L97812 Non-pressure chronic ulcer of other part of right lower leg with fat layer exposed: Secondary | ICD-10-CM | POA: Diagnosis not present

## 2022-03-05 DIAGNOSIS — L97822 Non-pressure chronic ulcer of other part of left lower leg with fat layer exposed: Secondary | ICD-10-CM | POA: Diagnosis not present

## 2022-03-05 NOTE — Progress Notes (Signed)
Kelly [name], Kelly [name] ([id number]) Visit Report for 03/05/2022 Allergy List Details Patient Name: Date of Service: Kelly [name], Kelly [name] 03/05/2022 3:15 PM Medical Record Number: [id number] Patient Account Number: [id number] Date of Birth/Sex: Treating RN: June 27, 1996 (26 y.o. Kelly [name] Primary Care [place] Sloop: Kelly [name] Other Clinician: Referring Kelly [name]: Treating Kelly [name]/Extender: Kelly [name] in Treatment: 55 Allergies Active Allergies doxycycline Reaction: rash, blisters Allergy Notes Electronic Signature(s) Signed: 03/05/2022 4:29:33 PM By: Kelly [name] Entered By: Kelly [name] 15:37:41 -------------------------------------------------------------------------------- Arrival Information Details Patient Name: Date of Service: Kelly [name]. 03/05/2022 3:15 PM Medical Record Number: [id number] Patient Account Number: [id number] Date of Birth/Sex: Treating RN: 1996/06/09 (26 y.o. Kelly [name], [name] Primary Care Kelly [name]: Kelly [name] Other Clinician: Referring Kelly [name]: Treating Kelly [name]/Extender: Kelly [name] in Treatment: 74 Visit Information Patient Arrived: Ambulatory Arrival Time: 15:20 Accompanied By: self Transfer Assistance: None Patient Identification Verified: Yes Secondary Verification Process Completed: Yes Patient Requires Transmission-Based Precautions: No Patient Has Alerts: Yes Patient Alerts: Left ABi=1.18 History Since Last Visit Added or deleted any medications: No Any new allergies or adverse reactions: Yes Had a fall or experienced change in activities of daily living that may affect risk of falls: No Signs or symptoms of abuse/neglect since last visito No Hospitalized since last visit: Yes Implantable device outside of the clinic excluding cellular tissue based products placed in the center since last visit: No Has Dressing in Place as Prescribed:  Yes Notes Per patient hospitalized x5 days at [facility] allergic reaction to Doxycycline. Electronic Signature(s) Signed: 03/05/2022 4:29:33 PM By: Kelly [name] Entered By: Kelly [name] 15:22:59 -------------------------------------------------------------------------------- Clinic Level of Care Assessment Details Patient Name: Date of Service: Kelly [name]. 03/05/2022 3:15 PM Medical Record Number: [id number] Patient Account Number: [id number] Date of Birth/Sex: Treating RN: 23-Apr-1996 (26 y.o. Kelly [name], Kelly [name] Primary Care Kelly [name]: Kelly [name] Other Clinician: Referring Kelly [name]: Treating Kelly [name]/Extender: Kelly [name] in Treatment: 55 Clinic Level of Care Assessment Items TOOL 4 Quantity Score X- 1 0 Use when only an EandM is performed on FOLLOW-UP visit ASSESSMENTS - Nursing Assessment / Reassessment X- 1 10 Reassessment of Co-morbidities (includes updates in patient status) X- 1 5 Reassessment of Adherence to Treatment Plan ASSESSMENTS - Wound and Skin A ssessment / Reassessment _0  - 0 Simple Wound Assessment / Reassessment - one wound X- 2 5 Complex Wound Assessment / Reassessment - multiple wounds _1  - 0 Dermatologic / Skin Assessment (not related to wound area) ASSESSMENTS - Focused Assessment X- 1 5 Circumferential Edema Measurements - multi extremities _2  - 0 Nutritional Assessment / Counseling / Intervention _3  - 0 Lower Extremity Assessment (monofilament, tuning fork, pulses) _4  - 0 Peripheral Arterial Disease Assessment (using hand held doppler) ASSESSMENTS - Ostomy and/or Continence Assessment and Care _5  - 0 Incontinence Assessment and Management _6  - 0 Ostomy Care Assessment and Management (repouching, etc.) PROCESS - Coordination of Care X - Simple Patient / Family Education for ongoing care 1 15 _7  - 0 Complex (extensive) Patient / Family Education for ongoing care X- 1 10 Staff  obtains Programmer, systems, Records, T Results / Process Orders est _8  - 0 Staff telephones HHA, Nursing Homes / Clarify orders / etc _9  - 0 Routine Transfer to another Facility (non-emergent condition) _10  - 0 Routine Hospital Admission (non-emergent condition) _11  - 0 New Admissions / Biomedical engineer / Ordering NPWT Apligraf, etc. , _12  - 0 Emergency Hospital Admission (emergent condition) _13  -  0 Simple Discharge Coordination X- 1 15 Complex (extensive) Discharge Coordination PROCESS - Special Needs _0  - 0 Pediatric / Minor Patient Management _1  - 0 Isolation Patient Management _2  - 0 Hearing / Language / Visual special needs _3  - 0 Assessment of Community assistance (transportation, D/C planning, etc.) _4  - 0 Additional assistance / Altered mentation _5  - 0 Support Surface(s) Assessment (bed, cushion, seat, etc.) INTERVENTIONS - Wound Cleansing / Measurement _6  - 0 Simple Wound Cleansing - one wound X- 2 5 Complex Wound Cleansing - multiple wounds X- 1 5 Wound Imaging (photographs - any number of wounds) _7  - 0 Wound Tracing (instead of photographs) _8  - 0 Simple Wound Measurement - one wound X- 2 5 Complex Wound Measurement - multiple wounds INTERVENTIONS - Wound Dressings _9  - 0 Small Wound Dressing one or multiple wounds X- 2 15 Medium Wound Dressing one or multiple wounds _10  - 0 Large Wound Dressing one or multiple wounds X- 1 5 Application of Medications - topical <[id number]>_9<\/[id number]>_91  - 0 Application of Medications - injection INTERVENTIONS - Miscellaneous _12  - 0 External ear exam _13  - 0 Specimen Collection (cultures, biopsies, blood, body fluids, etc.) _14  - 0 Specimen(s) / Culture(s) sent or taken to Lab for analysis _15  - 0 Patient Transfer (multiple staff / Civil Service fast streamer / Similar devices) _16  - 0 Simple Staple / Suture removal (25 or less) _17  - 0 Complex Staple / Suture removal (26 or more) _18  - 0 Hypo / Hyperglycemic Management (close monitor of Blood  Glucose) _19  - 0 Ankle / Brachial Index (ABI) - do not check if billed separately X- 1 5 Vital Signs Has the patient been seen at the hospital within the last three years: Yes Total Score: 135 Level Of Care: New/Established - Level 4 Electronic Signature(s) Signed: 03/05/2022 4:44:20 PM By: Rhae Hammock RN Entered By: Rhae Hammock on [name] 16:12:24 -------------------------------------------------------------------------------- Encounter Discharge Information Details Patient Name: Date of Service: Kelly Lou Kelly H M. 03/05/2022 3:15 PM Medical Record Number: [id number] Patient Account Number: [id number] Date of Birth/Sex: Treating RN: [birth year] (26 y.o. Kelly [name], Kelly [name] Primary Care Aila Terra: Kelly [name] Other Clinician: Referring Elnita Surprenant: Treating Bahja Bence/Extender: Kelly [name] in Treatment: 50 Encounter Discharge Information Items Discharge Condition: Stable Ambulatory Status: Ambulatory Discharge Destination: Home Transportation: Private Auto Accompanied By: self Schedule Follow-up Appointment: Yes Clinical Summary of Care: Patient Declined Electronic Signature(s) Signed: 03/05/2022 4:44:20 PM By: Rhae Hammock RN Entered By: Rhae Hammock on [name] 16:12:58 -------------------------------------------------------------------------------- Lower Extremity Assessment Details Patient Name: Date of Service: Kelly [name]. 03/05/2022 3:15 PM Medical Record Number: [id number] Patient Account Number: [id number] Date of Birth/Sex: Treating RN: 1995-10-22 (26 y.o. Kelly [name], Tammi Klippel Primary Care Rico Massar: Kelly [name] Other Clinician: Referring Delmi Fulfer: Treating Carlissa Pesola/Extender: Kelly [name] in Treatment: 55 Edema Assessment Assessed: Shirlyn Goltz: No] Patrice Paradise: Yes] Edema: [Left: Yes] [Right: Yes] Calf Left: Right: Point of Measurement: 27 cm From Medial Instep 33.1  cm Ankle Left: Right: Point of Measurement: 8 cm From Medial Instep 22.5 cm Vascular Assessment Pulses: Dorsalis Pedis Palpable: [Right:Yes] Electronic Signature(s) Signed: 03/05/2022 4:29:33 PM By: Kelly [name] Entered By: Kelly [name] 15:26:32 -------------------------------------------------------------------------------- Multi Wound Chart Details Patient Name: Date of Service: Kelly [name]. 03/05/2022 3:15 PM Medical Record Number: [id number] Patient Account Number: [id number] Date of Birth/Sex: Treating RN: [birth year] (26 y.o. Kelly [name], Kelly [name] Primary Care Abhi Moccia: Kelly [name] Other Clinician: Referring Keyetta Hollingworth: Treating Grayland Daisey/Extender: Kelly [name] in Treatment: 55 Vital  Signs Height(in): 62 Pulse(bpm): 106 Weight(lbs): Blood Pressure(mmHg): 148/100 Body Mass Index(BMI): Temperature(F): 99.1 Respiratory Rate(breaths/min): 20 Photos: [10:Right, Medial Lower Leg] [11:Right, Posterior Lower Leg] [N/A:N/A N/A] Wound Location: [10:Gradually Appeared] [11:Gradually Appeared] [N/A:N/A] Wounding Event: [10:Lupus] [11:Lupus] [N/A:N/A] Primary Etiology: [10:Asthma, Lupus Erythematosus] [11:Asthma, Lupus Erythematosus] [N/A:N/A] Comorbid History: [10:12/18/2021] [11:12/25/2021] [N/A:N/A] Date Acquired: [10:11] [11:8] [N/A:N/A] Weeks of Treatment: [10:Open] [11:Open] [N/A:N/A] Wound Status: [10:No] [11:No] [N/A:N/A] Wound Recurrence: [10:Yes] [11:No] [N/A:N/A] Clustered Wound: [10:3] [11:N/A] [N/A:N/A] Clustered Quantity: [10:6x5.5x0.5] [11:1.7x1x0.2] [N/A:N/A] Measurements L x W x D (cm) [03:54.656] [11:1.335] [N/A:N/A] A (cm) : rea [10:12.959] [11:0.267] [N/A:N/A] Volume (cm) : [10:-109.50%] [11:-1780.30%] [N/A:N/A] % Reduction in A rea: [10:-249.20%] [11:-1171.40%] [N/A:N/A] % Reduction in Volume: [10:12] Starting Position 1 (o'clock): [10:12] Ending Position 1 (o'clock): [10:1] Maximum Distance 1  (cm): [10:Yes] [11:No] [N/A:N/A] Undermining: [10:Full Thickness With Exposed Support Full Thickness With Exposed Support N/A] Classification: [10:Structures Medium] [11:Structures Medium] [N/A:N/A] Exudate Amount: [10:Serosanguineous] [11:Serosanguineous] [N/A:N/A] Exudate Type: [10:red, brown] [11:red, brown] [N/A:N/A] Exudate Color: [10:Well defined, not attached] [11:Distinct, outline attached] [N/A:N/A] Wound Margin: [10:Medium (34-66%)] [11:Large (67-100%)] [N/A:N/A] Granulation Amount: [10:Red, Pink] [11:Red, Pink] [N/A:N/A] Granulation Quality: [10:Medium (34-66%)] [11:Small (1-33%)] [N/A:N/A] Necrotic Amount: [10:Fat Layer (Subcutaneous Tissue): Yes Fat Layer (Subcutaneous Tissue): Yes N/A] Exposed Structures: [10:Fascia: No Tendon: No Muscle: No Joint: No Bone: No None] [11:Fascia: No Tendon: No Muscle: No Joint: No Bone: No None] [N/A:N/A] Treatment Notes Electronic Signature(s) Signed: 03/05/2022 4:25:55 PM By: Kalman Shan DO Signed: 03/05/2022 4:44:20 PM By: Rhae Hammock RN Entered By: Kalman Shan on [name] 16:04:48 -------------------------------------------------------------------------------- Multi-Disciplinary Care Plan Details Patient Name: Date of Service: Kelly Lou Kelly H M. 03/05/2022 3:15 PM Medical Record Number: [id number] Patient Account Number: [id number] Date of Birth/Sex: Treating RN: 14-Dec-1995 (26 y.o. Kelly [name], Kelly [name] Primary Care Ry Moody: Kelly [name] Other Clinician: Referring Lander Eslick: Treating Genita Nilsson/Extender: Kelly [name] in Treatment: 55 Multidisciplinary Care Plan reviewed with physician Active Inactive Wound/Skin Impairment Nursing Diagnoses: Impaired tissue integrity Goals: Patient/caregiver will verbalize understanding of skin care regimen Date Initiated: 02/13/2021 Target Resolution Date: 03/23/2022 Goal Status: Active Ulcer/skin breakdown will have a volume reduction of 30% by  week 4 Date Initiated: 02/13/2021 Date Inactivated: 04/21/2021 Target Resolution Date: 03/13/2021 Unmet Reason: pt did not return to Goal Status: Unmet clinic for F/U Ulcer/skin breakdown will have a volume reduction of 80% by week 12 Date Initiated: 04/21/2021 Date Inactivated: 07/13/2021 Target Resolution Date: 07/27/2021 Goal Status: Met Interventions: Assess patient/caregiver ability to obtain necessary supplies Assess patient/caregiver ability to perform ulcer/skin care regimen upon admission and as needed Assess ulceration(s) every visit Provide education on ulcer and skin care Treatment Activities: Skin care regimen initiated : 02/13/2021 Topical wound management initiated : 02/13/2021 Notes: 06/29/21: Wounds 3 and 6 only at 80% volume reduction. Target date extended. Electronic Signature(s) Signed: 03/05/2022 4:44:20 PM By: Rhae Hammock RN Entered By: Rhae Hammock on [name] 15:38:09 -------------------------------------------------------------------------------- Pain Assessment Details Patient Name: Date of Service: Kelly [name]. 03/05/2022 3:15 PM Medical Record Number: [id number] Patient Account Number: [id number] Date of Birth/Sex: Treating RN: 10-Jun-1996 (26 y.o. Kelly [name] Primary Care Hanifa Antonetti: Kelly [name] Other Clinician: Referring Caleb Decock: Treating Alyona Romack/Extender: Kelly [name] in Treatment: 55 Active Problems Location of Pain Severity and Description of Pain Patient Has Paino No Site Locations Rate the pain. Current Pain Level: 0 Pain Management and Medication Current Pain Management: Medication: No Cold Application: No Rest: No Massage: No Activity: No T.E.N.S.: No Heat Application: No Leg drop or elevation: No Is  the Current Pain Management Adequate: Adequate How does your wound impact your activities of daily livingo Sleep: No Bathing: No Appetite: No Relationship With Others:  No Bladder Continence: No Emotions: No Bowel Continence: No Work: No Toileting: No Drive: No Dressing: No Hobbies: No Engineer, maintenance) Signed: 03/05/2022 4:29:33 PM By: Kelly [name] Entered By: Kelly [name] 15:26:04 -------------------------------------------------------------------------------- Patient/Caregiver Education Details Patient Name: Date of Service: Kelly [name] 7/10/2023andnbsp3:15 PM Medical Record Number: [id number] Patient Account Number: [id number] Date of Birth/Gender: Treating RN: 1995-09-20 (25 y.o. Kelly [name], Kelly [name] Primary Care Physician: Kelly [name] Other Clinician: Referring Physician: Treating Physician/Extender: Kelly [name] in Treatment: 5 Education Assessment Education Provided To: Patient Education Topics Provided Wound/Skin Impairment: Methods: Explain/Verbal Responses: State content correctly [employer]) Signed: 03/05/2022 4:44:20 PM By: Rhae Hammock RN Entered By: Rhae Hammock on [name] 15:38:21 -------------------------------------------------------------------------------- Wound Assessment Details Patient Name: Date of Service: Kelly [name]. 03/05/2022 3:15 PM Medical Record Number: [id number] Patient Account Number: [id number] Date of Birth/Sex: Treating RN: 17-Jun-1996 (26 y.o. Kelly [name], [name] Primary Care Charli Liberatore: Kelly [name] Other Clinician: Referring Kathren Scearce: Treating Jaquavis Felmlee/Extender: Kelly [name] in Treatment: 94 Wound Status Wound Number: 10 Primary Etiology: Lupus Wound Location: Right, Medial Lower Leg Wound Status: Open Wounding Event: Gradually Appeared Comorbid History: Asthma, Lupus Erythematosus Date Acquired: 12/18/2021 Weeks Of Treatment: 11 Clustered Wound: Yes Photos Wound Measurements Length: (cm) 6 Width: (cm) 5.5 Depth: (cm) 0.5 Clustered Quantity: 3 Area:  (cm) 25.918 Volume: (cm) 12.959 % Reduction in Area: -109.5% % Reduction in Volume: -249.2% Epithelialization: None Tunneling: No Undermining: Yes Starting Position (o'clock): 12 Ending Position (o'clock): 12 Maximum Distance: (cm) 1 Wound Description Classification: Full Thickness With Exposed Support Structures Wound Margin: Well defined, not attached Exudate Amount: Medium Exudate Type: Serosanguineous Exudate Color: red, brown Foul Odor After Cleansing: No Slough/Fibrino Yes Wound Bed Granulation Amount: Medium (34-66%) Exposed Structure Granulation Quality: Red, Pink Fascia Exposed: No Necrotic Amount: Medium (34-66%) Fat Layer (Subcutaneous Tissue) Exposed: Yes Necrotic Quality: Adherent Slough Tendon Exposed: No Muscle Exposed: No Joint Exposed: No Bone Exposed: No Treatment Notes Wound #10 (Lower Leg) Wound Laterality: Right, Medial Cleanser Soap and Water Discharge Instruction: May shower and wash wound with dial antibacterial soap and water prior to dressing change. Wound Cleanser Discharge Instruction: Cleanse the wound with wound cleanser prior to applying a clean dressing using gauze sponges, not tissue or cotton balls. Peri-Wound Care Skin Prep Discharge Instruction: Use skin prep as directed Topical Primary Dressing MediHoney Gel, tube 1.5 (oz) Discharge Instruction: Apply to wound bed as instructed Secondary Dressing Woven Gauze Sponge, Non-Sterile 4x4 in Discharge Instruction: Apply over primary dressing as directed. Zetuvit Plus Silicone Border Dressing 7x7(in/in) Discharge Instruction: Apply silicone border over primary dressing as directed. Secured With Compression Wrap Compression Stockings Environmental education officer) Signed: 03/05/2022 4:29:33 PM By: Kelly [name] Entered By: Kelly [name] 15:34:24 -------------------------------------------------------------------------------- Wound Assessment Details Patient  Name: Date of Service: Kelly [name]. 03/05/2022 3:15 PM Medical Record Number: [id number] Patient Account Number: [id number] Date of Birth/Sex: Treating RN: 1996-05-28 (26 y.o. Kelly [name] Primary Care Felicita Nuncio: Kelly [name] Other Clinician: Referring Keanen Dohse: Treating Marda Breidenbach/Extender: Kelly [name] in Treatment: 58 Wound Status Wound Number: 11 Primary Etiology: Lupus Wound Location: Right, Posterior Lower Leg Wound Status: Open Wounding Event: Gradually Appeared Comorbid History: Asthma, Lupus Erythematosus Date Acquired: 12/25/2021 Weeks Of Treatment: 8 Clustered Wound: No Photos Wound Measurements Length: (  cm) 1.7 Width: (cm) 1 Depth: (cm) 0.2 Area: (cm) 1.335 Volume: (cm) 0.267 % Reduction in Area: -1780.3% % Reduction in Volume: -1171.4% Epithelialization: None Tunneling: No Undermining: No Wound Description Classification: Full Thickness With Exposed Support Structures Wound Margin: Distinct, outline attached Exudate Amount: Medium Exudate Type: Serosanguineous Exudate Color: red, brown Foul Odor After Cleansing: No Slough/Fibrino Yes Wound Bed Granulation Amount: Large (67-100%) Exposed Structure Granulation Quality: Red, Pink Fascia Exposed: No Necrotic Amount: Small (1-33%) Fat Layer (Subcutaneous Tissue) Exposed: Yes Necrotic Quality: Adherent Slough Tendon Exposed: No Muscle Exposed: No Joint Exposed: No Bone Exposed: No Treatment Notes Wound #11 (Lower Leg) Wound Laterality: Right, Posterior Cleanser Soap and Water Discharge Instruction: May shower and wash wound with dial antibacterial soap and water prior to dressing change. Wound Cleanser Discharge Instruction: Cleanse the wound with wound cleanser prior to applying a clean dressing using gauze sponges, not tissue or cotton balls. Peri-Wound Care Skin Prep Discharge Instruction: Use skin prep as directed Topical Primary Dressing MediHoney  Calcium Alginate Dressing 4x5 in Discharge Instruction: Apply to wound bed as instructed Secondary Dressing Woven Gauze Sponge, Non-Sterile 4x4 in Discharge Instruction: Apply over primary dressing as directed. Zetuvit Plus Silicone Border Dressing 7x7(in/in) Discharge Instruction: Apply silicone border over primary dressing as directed. Secured With Compression Wrap Compression Stockings Environmental education officer) Signed: 03/05/2022 4:29:33 PM By: Kelly [name] Entered By: Kelly [name] 15:33:14 -------------------------------------------------------------------------------- Vitals Details Patient Name: Date of Service: Kelly Lou Kelly H M. 03/05/2022 3:15 PM Medical Record Number: [id number] Patient Account Number: [id number] Date of Birth/Sex: Treating RN: Dec 08, 1995 (26 y.o. Kelly [name], [name] Primary Care Nile Prisk: Kelly [name] Other Clinician: Referring Mendy Chou: Treating Audia Amick/Extender: Kelly [name] in Treatment: 55 Vital Signs Time Taken: 15:20 Temperature (F): 99.1 Height (in): 62 Pulse (bpm): 106 Respiratory Rate (breaths/min): 20 Blood Pressure (mmHg): 148/100 Reference Range: 80 - 120 mg / dl Electronic Signature(s) Signed: 03/05/2022 4:29:33 PM By: Kelly [name] Entered By: Kelly [name] 15:25:49

## 2022-03-05 NOTE — Progress Notes (Signed)
Kelly [name] ([id number]) Visit Report for 03/05/2022 Chief Complaint Document Details Patient Name: Date of Service: Kelly, [name] 03/05/2022 3:15 PM Medical Record Number: [id number] Patient Account Number: [id number] Date of Birth/Sex: Treating RN: [birth year] (25 y.o. Kelly [name] Primary Care Provider: Dione Booze Other Clinician: Referring Provider: Treating Provider/Extender: Kelly [name] in Treatment: 21 Information Obtained from: Patient Chief Complaint Bilateral lower extremity wounds 04/21/21: left lower extremity wound s/p cellulitis 09/01/21; right lower extremity wound Electronic Signature(s) Signed: 03/05/2022 4:25:55 PM By: Geralyn Corwin DO Entered By: Geralyn Corwin on 03/05/2022 16:04:57 -------------------------------------------------------------------------------- HPI Details Patient Name: Date of Service: Kelly Milling [name] 03/05/2022 3:15 PM Medical Record Number: [id number] Patient Account Number: [id number] Date of Birth/Sex: Treating RN: 03-22-1996 (25 y.o. Kelly [name] Primary Care Provider: Dione Booze Other Clinician: Referring Provider: Treating Provider/Extender: Kelly [name] in Treatment: 48 History of Present Illness HPI Description: Admission 6/21 [name] with a past medical history of systemic lupus erythematosus that presents with bilateral lower extremity wounds that started in April 2022. She has had skin issues in the past where she developed very small wounds but these healed with time. She has never had open wounds like she does on her legs. She currently reports minimal drainage to the wounds. She does have pain to these areas but has been overall stable. She denies signs of infection. She has visited the ED for this issue and was recently prescribed doxycycline for possible cellulitis. She follows with  [facility] for her SLE and is currently on Plaquenil, prednisone and CellCept. 6/27; patient presents for 1 week follow-up. She states she saw her rheumatologist on 6/22 and prednisone was increased from 10 mg to 20 mg daily. She is currently at the highest dose of Plaquenil and CellCept and is going to try an infusion later today at her regimen. She reports improvement to the wounds. She has been using collagen with dressing changes. She currently denies signs of infection. Readmission 8/26 Patient presents to clinic today for open wounds to her left lower extremity. Her previous wounds that were treated have closed. She states that on 8/9 she developed cellulitis and was treated with 2 rounds of antibiotics. Her cellulitis has resolved. She had blisters that opened and are now large wounds. She has seen her dermatologist and rheumatologist since she has systemic lupus and a hard time healing. No changes have been made to her medications. She was recommended antibiotic ointment by her dermatologist to her wounds She picked up the antibiotic ointment today.. She reports pain to these areas. She denies purulent drainage, increased warmth or erythema to the left lower extremity. She currently keeps the areas covered. Of note she had an Achilles tendon rupture on her right leg and is currently in a soft cast. 9/13; patient presents for follow-up. She missed her last clinic appointment. She has been using Santyl to the wound beds daily. She has no issues or complaints today. She reports the Santyl is helping clean up the wounds. She currently denies signs of infection. 9/22; patient states that 1 week ago she was seen by dermatology and they placed an Unna boot with Bactroban on her left lower extremity. Today she presents with a Unna boot in place. She currently denies signs of infection. 10/7; patient presents for follow-up. She reports improvement. She has been using Santyl to the areas of  nonviable tissue and collagen to granulation tissue. She  has been approved for infusions for her lupus. She is going to start these soon. These will be injection she can do at home. She currently denies signs of infection. 10/21; patient presents for follow-up. She reports continued improvement to her wound healing. She has been using Santyl to all areas except for the superior posterior left leg wound and she is using collagen to this. She has started her injection therapy for lupus. She started 1 week ago. She currently denies signs of infection. 11/3; patient presents for follow-up. She has no issues or complaints today. She is on her third week of injection therapy for lupus and tolerating this well. She has no issues or complaints today. She denies signs of infection. 11/17; patient presents for 2-week follow-up. She reports improvement in wound healing. She denies signs of infection. 12/1; patient presents for follow-up. She has been using collagen to the anterior left lower extremity wounds and Santyl to the left lateral malleolus. She has no issues or complaints today. 12/15; patient presents for follow-up. She is using collagen to the 2 anterior left leg wounds and Santyl to the left lateral malleolus wound. She reports improvement in wound healing. She denies signs of infection. 12/22; patient presents because she is having purulent drainage from her anterior left leg wound. She reports no inciting event and states that the area started draining spontaneously. She was hoping that the drainage would stop however this has not improved. She states this started 5 days ago. She reports tenderness to the wound bed. 12/29; patient presents for follow-up. She was able to take Augmentin for 4 days but did not tolerate the GI side effects. She continues to take doxycycline. She reports improvement in drainage. She no longer has increased warmth to the surrounding wound bed. She ran out of [place] and  has been using Vaseline to the wound beds. 1/6; Patient presents for follow-up. She has been taking Keflex without issues. She reports improvement in drainage and wound healing T the left lower o extremity. Unfortunately she developed another wound spontaneously to the right anterior lower extremity. She denies systemic signs of infection. 1/16; patient presents for follow-up. Patient completed her first prescription of Keflex and has not started the second. She reports no more drainage to the wound beds. She states there was an issue with insurance however she is going to be able to obtain Santyl soon. She has been using Dakin's wet-to-dry dressings to the right anterior leg and left lateral ankle. She has been using collagen to the distal anterior left leg wound and reports that the proximal anterior left leg wound has healed. She denies systemic signs of infection. 1/31; patient presents for follow-up. She reports improvement to the left leg wounds. She reports increased inflammation to the right leg wound. She thinks she is having a reaction to the foam border dressing on the right leg. She is unable to obtain Santyl. She has been using Dakin's wet-to-dry dressings to the right anterior leg and left lateral ankle. She has been using collagen to the distal anterior left leg wound. 2/13; patient presents for follow-up. She reports improvement in her wound healing. One of the wounds to the anterior aspect of the left lower leg has slightly reopened. She denies signs of infection. 2/27; patient presents for follow-up. She has no issues or complaints today. She denies signs of infection. 3/20; patient presents for follow-up. She has been using Medihoney to the right lower extremity wound and left lateral ankle wound. She continues  to use collagen to the left anterior leg wound. She has no issues or complaints today. She denies signs of infection. She reports improvement in wound 4/10; patient has  been using Medihoney to the right anterior lower leg wound and silver collagen on the left including the left ankle and left anterior. Fortunately the left anterior lower leg wound is healed. Both of the areas on the left ankle and the right anterior lower leg appear clean. These wounds are in the setting of chronic lupus. She has had multitude of chronic wounds and a rash on her arms. She tells me she has had both her arms and legs biopsied by dermatology at [facility] and also follows with rheumatology at [facility] although she is not had an appointment this year. She does not have a history of blood clots either arterial or venous. 4/24; patient presents for follow-up. She has been using collagen to the open wound beds. She has developed new areas to her right lower leg. She currently denies signs of infection. 5/9; patient presents for follow-up. She states she is seeing her rheumatologist next week. She has been using Medihoney to all the wound beds except for the lateral left malleolus. She has been using collegen here. She reports improvement to some areas and other areas are flaring due to her lupus. She currently denies signs of infection. 5/23; Patient saw her rheumatologist last week. She has no active lupus activity at this time. Other options for treatment were given. She is going to continue with her current therapy of weekly injections. She reports improvement to Her wound healing on the left ankle. She has been putting Medihoney here. On the right lower extremity she has had a flareup and has 2 open ulcers. She currently denies signs of infection including increased warmth, erythema or purulent drainage. She has been using Medihoney here as well. 7/10; patient presents for follow-up. She was evaluated in the ED on 6/21 and prescribed Doxycycline For worsening pain to her right lower extremity with increased erythema. She subsequently developed a systemic reaction from the antibiotic and had A  diffuse rash. There was concern that her lupus is not well controlled. It was recommended that she start Belimumab. She states she is starting the infusions next week. She has using silver alginate to the wound beds. She currently denies systemic signs of infection. Electronic Signature(s) Signed: 03/05/2022 4:25:55 PM By: Geralyn Corwin DO Entered By: Geralyn Corwin on 03/05/2022 16:13:39 -------------------------------------------------------------------------------- Physical Exam Details Patient Name: Date of Service: [name] 03/05/2022 3:15 PM Medical Record Number: [id number] Patient Account Number: [id number] Date of Birth/Sex: Treating RN: [birth year] (25 y.o. Kelly [name] Primary Care Provider: Dione Booze Other Clinician: Referring Provider: Treating Provider/Extender: Kelly [name] in Treatment: 55 Constitutional respirations regular, non-labored and within target range for patient.. Cardiovascular 2+ dorsalis pedis/posterior tibialis pulses. Psychiatric pleasant and cooperative. Notes Right lower extremity: T the medial aspect there are 4 open wounds with granulation tissue and non viable tissue. No signs of surrounding tissue infection. o Electronic Signature(s) Signed: 03/05/2022 4:25:55 PM By: Geralyn Corwin DO Entered By: Geralyn Corwin on 03/05/2022 16:14:25 -------------------------------------------------------------------------------- Physician Orders Details Patient Name: Date of Service: Kelly Milling RIA Cherly [name]. 03/05/2022 3:15 PM Medical Record Number: [id number] Patient Account Number: [id number] Date of Birth/Sex: Treating RN: 07-13-1996 (25 y.o. Kelly [name] Primary Care Provider: Dione Booze Other Clinician: Referring Provider: Treating Provider/Extender: Kelly [name] in Treatment: 4 Verbal / Phone Orders: No  Diagnosis Coding Follow-up  Appointments ppointment in 2 weeks. - Monday 03/19/22 @ 1515 w/ Dr. Mikey BussingHOffman and Maryruth BunLauran [facility](Carrie Covering) Room # 9 Return A Other: - ****Continue taking cephalexin as your PCP prescribed**** ***First infusion July 18,2023*** Bathing/ Shower/ Hygiene May shower and wash wound with soap and water. Edema Control - Lymphedema / SCD / Other Elevate legs to the level of the heart or above for 30 minutes daily and/or when sitting, a frequency of: - 3-4 times a day throughout the day. Avoid standing for long periods of time. Additional Orders / Instructions Follow Nutritious Diet Wound Treatment Wound #10 - Lower Leg Wound Laterality: Right, Medial Cleanser: Soap and Water 2 x Per Day/15 Days Discharge Instructions: May shower and wash wound with dial antibacterial soap and water prior to dressing change. Cleanser: Wound Cleanser (DME) (Generic) 2 x Per Day/15 Days Discharge Instructions: Cleanse the wound with wound cleanser prior to applying a clean dressing using gauze sponges, not tissue or cotton balls. Peri-Wound Care: Skin Prep (DME) (Generic) 2 x Per Day/15 Days Discharge Instructions: Use skin prep as directed Prim Dressing: MediHoney Gel, tube 1.5 (oz) 2 x Per Day/15 Days ary Discharge Instructions: Apply to wound bed as instructed Secondary Dressing: Woven Gauze Sponge, Non-Sterile 4x4 in (DME) (Generic) 2 x Per Day/15 Days Discharge Instructions: Apply over primary dressing as directed. Secondary Dressing: Zetuvit Plus Silicone Border Dressing 7x7(in/in) (DME) (Generic) 2 x Per Day/15 Days Discharge Instructions: Apply silicone border over primary dressing as directed. Wound #11 - Lower Leg Wound Laterality: Right, Posterior Cleanser: Soap and Water 2 x Per Day/15 Days Discharge Instructions: May shower and wash wound with dial antibacterial soap and water prior to dressing change. Cleanser: Wound Cleanser (Generic) 2 x Per Day/15 Days Discharge Instructions: Cleanse the wound with  wound cleanser prior to applying a clean dressing using gauze sponges, not tissue or cotton balls. Peri-Wound Care: Skin Prep (DME) (Generic) 2 x Per Day/15 Days Discharge Instructions: Use skin prep as directed Prim Dressing: MediHoney Calcium Alginate Dressing 4x5 in (DME) (Generic) 2 x Per Day/15 Days ary Discharge Instructions: Apply to wound bed as instructed Secondary Dressing: Woven Gauze Sponge, Non-Sterile 4x4 in (DME) (Generic) 2 x Per Day/15 Days Discharge Instructions: Apply over primary dressing as directed. Secondary Dressing: Zetuvit Plus Silicone Border Dressing 7x7(in/in) (DME) (Generic) 2 x Per Day/15 Days Discharge Instructions: Apply silicone border over primary dressing as directed. Electronic Signature(s) Signed: 03/05/2022 4:25:55 PM By: Geralyn CorwinHoffman, Rogue Rafalski DO Entered By: Geralyn CorwinHoffman, Alechia Lezama on 03/05/2022 16:14:36 -------------------------------------------------------------------------------- Problem List Details Patient Name: Date of Service: Kelly MillingNEWKIRK, Kelly RIA Cherly AndersonH M. 03/05/2022 3:15 PM Medical Record Number: [id number] Patient Account Number: [id number] Date of Birth/Sex: Treating RN: 1996-06-21 (25 y.o. Kelly RowanF) Breedlove, [name] Primary Care Provider: Dione BoozePartridge, James Other Clinician: Referring Provider: Treating Provider/Extender: Kelly AfricaHoffman, Garcia Dalzell Partridge, James Weeks in Treatment: 55 Active Problems ICD-10 Encounter Code Description Active Date MDM Diagnosis [phone] Non-pressure chronic ulcer of other part of left lower leg with fat layer exposed11/10/2020 No Yes L97.812 Non-pressure chronic ulcer of other part of right lower leg with fat layer 06/29/2021 No Yes exposed M32.9 Systemic lupus erythematosus, unspecified 02/14/2021 No Yes Inactive Problems ICD-10 Code Description Active Date Inactive Date L97.819 Non-pressure chronic ulcer of other part of right lower leg with unspecified severity 02/13/2021 02/13/2021 L97.829 Non-pressure chronic ulcer of other part of  left lower leg with unspecified severity 02/13/2021 02/13/2021 S81.802A Unspecified open wound, left lower leg, initial encounter 04/21/2021 04/21/2021 S91.002A Unspecified open wound, left ankle, initial encounter  04/21/2021 04/21/2021 Resolved Problems ICD-10 Code Description Active Date Resolved Date L03.116 Cellulitis of left lower limb 08/24/2021 08/24/2021 Electronic Signature(s) Signed: 03/05/2022 4:25:55 PM By: Geralyn Corwin DO Entered By: Geralyn Corwin on 03/05/2022 16:04:39 -------------------------------------------------------------------------------- Progress Note Details Patient Name: Date of Service: Kelly Milling RIA Cherly [name]. 03/05/2022 3:15 PM Medical Record Number: [id number] Patient Account Number: [id number] Date of Birth/Sex: Treating RN: 08-Dec-1995 (25 y.o. Kelly [name] Primary Care Provider: Dione Booze Other Clinician: Referring Provider: Treating Provider/Extender: Kelly [name] in Treatment: 55 Subjective Chief Complaint Information obtained from Patient Bilateral lower extremity wounds 04/21/21: left lower extremity wound s/p cellulitis 09/01/21; right lower extremity wound History of Present Illness (HPI) Admission 6/21 [name] with a past medical history of systemic lupus erythematosus that presents with bilateral lower extremity wounds that started in April 2022. She has had skin issues in the past where she developed very small wounds but these healed with time. She has never had open wounds like she does on her legs. She currently reports minimal drainage to the wounds. She does have pain to these areas but has been overall stable. She denies signs of infection. She has visited the ED for this issue and was recently prescribed doxycycline for possible cellulitis. She follows with [facility] for her SLE and is currently on Plaquenil, prednisone and CellCept. 6/27; patient presents  for 1 week follow-up. She states she saw her rheumatologist on 6/22 and prednisone was increased from 10 mg to 20 mg daily. She is currently at the highest dose of Plaquenil and CellCept and is going to try an infusion later today at her regimen. She reports improvement to the wounds. She has been using collagen with dressing changes. She currently denies signs of infection. Readmission 8/26 Patient presents to clinic today for open wounds to her left lower extremity. Her previous wounds that were treated have closed. She states that on 8/9 she developed cellulitis and was treated with 2 rounds of antibiotics. Her cellulitis has resolved. She had blisters that opened and are now large wounds. She has seen her dermatologist and rheumatologist since she has systemic lupus and a hard time healing. No changes have been made to her medications. She was recommended antibiotic ointment by her dermatologist to her wounds She picked up the antibiotic ointment today.. She reports pain to these areas. She denies purulent drainage, increased warmth or erythema to the left lower extremity. She currently keeps the areas covered. Of note she had an Achilles tendon rupture on her right leg and is currently in a soft cast. 9/13; patient presents for follow-up. She missed her last clinic appointment. She has been using Santyl to the wound beds daily. She has no issues or complaints today. She reports the Santyl is helping clean up the wounds. She currently denies signs of infection. 9/22; patient states that 1 week ago she was seen by dermatology and they placed an Unna boot with Bactroban on her left lower extremity. Today she presents with a Unna boot in place. She currently denies signs of infection. 10/7; patient presents for follow-up. She reports improvement. She has been using Santyl to the areas of nonviable tissue and collagen to granulation tissue. She has been approved for infusions for her lupus. She is  going to start these soon. These will be injection she can do at home. She currently denies signs of infection. 10/21; patient presents for follow-up. She reports continued improvement to her wound healing.  She has been using Santyl to all areas except for the superior posterior left leg wound and she is using collagen to this. She has started her injection therapy for lupus. She started 1 week ago. She currently denies signs of infection. 11/3; patient presents for follow-up. She has no issues or complaints today. She is on her third week of injection therapy for lupus and tolerating this well. She has no issues or complaints today. She denies signs of infection. 11/17; patient presents for 2-week follow-up. She reports improvement in wound healing. She denies signs of infection. 12/1; patient presents for follow-up. She has been using collagen to the anterior left lower extremity wounds and Santyl to the left lateral malleolus. She has no issues or complaints today. 12/15; patient presents for follow-up. She is using collagen to the 2 anterior left leg wounds and Santyl to the left lateral malleolus wound. She reports improvement in wound healing. She denies signs of infection. 12/22; patient presents because she is having purulent drainage from her anterior left leg wound. She reports no inciting event and states that the area started draining spontaneously. She was hoping that the drainage would stop however this has not improved. She states this started 5 days ago. She reports tenderness to the wound bed. 12/29; patient presents for follow-up. She was able to take Augmentin for 4 days but did not tolerate the GI side effects. She continues to take doxycycline. She reports improvement in drainage. She no longer has increased warmth to the surrounding wound bed. She ran out of [place] and has been using Vaseline to the wound beds. 1/6; Patient presents for follow-up. She has been taking Keflex  without issues. She reports improvement in drainage and wound healing T the left lower o extremity. Unfortunately she developed another wound spontaneously to the right anterior lower extremity. She denies systemic signs of infection. 1/16; patient presents for follow-up. Patient completed her first prescription of Keflex and has not started the second. She reports no more drainage to the wound beds. She states there was an issue with insurance however she is going to be able to obtain Santyl soon. She has been using Dakin's wet-to-dry dressings to the right anterior leg and left lateral ankle. She has been using collagen to the distal anterior left leg wound and reports that the proximal anterior left leg wound has healed. She denies systemic signs of infection. 1/31; patient presents for follow-up. She reports improvement to the left leg wounds. She reports increased inflammation to the right leg wound. She thinks she is having a reaction to the foam border dressing on the right leg. She is unable to obtain Santyl. She has been using Dakin's wet-to-dry dressings to the right anterior leg and left lateral ankle. She has been using collagen to the distal anterior left leg wound. 2/13; patient presents for follow-up. She reports improvement in her wound healing. One of the wounds to the anterior aspect of the left lower leg has slightly reopened. She denies signs of infection. 2/27; patient presents for follow-up. She has no issues or complaints today. She denies signs of infection. 3/20; patient presents for follow-up. She has been using Medihoney to the right lower extremity wound and left lateral ankle wound. She continues to use collagen to the left anterior leg wound. She has no issues or complaints today. She denies signs of infection. She reports improvement in wound 4/10; patient has been using Medihoney to the right anterior lower leg wound and silver collagen on  the left including the left  ankle and left anterior. Fortunately the left anterior lower leg wound is healed. Both of the areas on the left ankle and the right anterior lower leg appear clean. These wounds are in the setting of chronic lupus. She has had multitude of chronic wounds and a rash on her arms. She tells me she has had both her arms and legs biopsied by dermatology at [facility] and also follows with rheumatology at [facility] although she is not had an appointment this year. She does not have a history of blood clots either arterial or venous. 4/24; patient presents for follow-up. She has been using collagen to the open wound beds. She has developed new areas to her right lower leg. She currently denies signs of infection. 5/9; patient presents for follow-up. She states she is seeing her rheumatologist next week. She has been using Medihoney to all the wound beds except for the lateral left malleolus. She has been using collegen here. She reports improvement to some areas and other areas are flaring due to her lupus. She currently denies signs of infection. 5/23; Patient saw her rheumatologist last week. She has no active lupus activity at this time. Other options for treatment were given. She is going to continue with her current therapy of weekly injections. She reports improvement to Her wound healing on the left ankle. She has been putting Medihoney here. On the right lower extremity she has had a flareup and has 2 open ulcers. She currently denies signs of infection including increased warmth, erythema or purulent drainage. She has been using Medihoney here as well. 7/10; patient presents for follow-up. She was evaluated in the ED on 6/21 and prescribed Doxycycline For worsening pain to her right lower extremity with increased erythema. She subsequently developed a systemic reaction from the antibiotic and had A diffuse rash. There was concern that her lupus is not well controlled. It was recommended that she start  Belimumab. She states she is starting the infusions next week. She has using silver alginate to the wound beds. She currently denies systemic signs of infection. Patient History Information obtained from Patient. Allergies doxycycline (Reaction: rash, blisters) Family History Diabetes - Paternal Grandparents, Hypertension - Paternal Grandparents, Thyroid Problems - Maternal Grandparents,Paternal Grandparents, No family history of Cancer, Heart Disease, Hereditary Spherocytosis, Kidney Disease, Lung Disease, Seizures, Stroke, Tuberculosis. Social History Never smoker, Marital Status - Married, Alcohol Use - Moderate, Drug Use - Current History - Marijuana, Caffeine Use - Daily - Coffee. Medical History Respiratory Patient has history of Asthma Immunological Patient has history of Lupus Erythematosus Objective Constitutional respirations regular, non-labored and within target range for patient.. Vitals Time Taken: 3:20 PM, Height: 62 in, Temperature: 99.1 F, Pulse: 106 bpm, Respiratory Rate: 20 breaths/min, Blood Pressure: 148/100 mmHg. Cardiovascular 2+ dorsalis pedis/posterior tibialis pulses. Psychiatric pleasant and cooperative. General Notes: Right lower extremity: T the medial aspect there are 4 open wounds with granulation tissue and non viable tissue. No signs of surrounding o tissue infection. Integumentary (Hair, Skin) Wound #10 status is Open. Original cause of wound was Gradually Appeared. The date acquired was: 12/18/2021. The wound has been in treatment 11 weeks. The wound is located on the Right,Medial Lower Leg. The wound measures 6cm length x 5.5cm width x 0.5cm depth; 25.918cm^2 area and 12.959cm^3 volume. There is Fat Layer (Subcutaneous Tissue) exposed. There is no tunneling noted, however, there is undermining starting at 12:00 and ending at 12:00 with a maximum distance of 1cm. There is a  medium amount of serosanguineous drainage noted. The wound margin is well  defined and not attached to the wound base. There is medium (34-66%) red, pink granulation within the wound bed. There is a medium (34-66%) amount of necrotic tissue within the wound bed including Adherent Slough. Wound #11 status is Open. Original cause of wound was Gradually Appeared. The date acquired was: 12/25/2021. The wound has been in treatment 8 weeks. The wound is located on the Right,Posterior Lower Leg. The wound measures 1.7cm length x 1cm width x 0.2cm depth; 1.335cm^2 area and 0.267cm^3 volume. There is Fat Layer (Subcutaneous Tissue) exposed. There is no tunneling or undermining noted. There is a medium amount of serosanguineous drainage noted. The wound margin is distinct with the outline attached to the wound base. There is large (67-100%) red, pink granulation within the wound bed. There is a small (1-33%) amount of necrotic tissue within the wound bed including Adherent Slough. Assessment Active Problems ICD-10 Non-pressure chronic ulcer of other part of left lower leg with fat layer exposed Non-pressure chronic ulcer of other part of right lower leg with fat layer exposed Systemic lupus erythematosus, unspecified Unfortunately patient continues to have flareup of her SLE. She has a couple more wounds present today. I recommended using Medihoney to the wound with a nonviable surface throughout and silver alginate with infuse Medihoney to the remaining wounds. She is going to start Belimumab Infusions and hopefully this will help address her underlying issue causing the wounds. Follow-up in 2 weeks. Plan Follow-up Appointments: Return Appointment in 2 weeks. - Monday 03/19/22 @ 1515 w/ Dr. Mikey Bussing and Maryruth Bun [facility] Covering) Room # 9 Other: - ****Continue taking cephalexin as your PCP prescribed**** ***First infusion July 18,2023*** Bathing/ Shower/ Hygiene: May shower and wash wound with soap and water. Edema Control - Lymphedema / SCD / Other: Elevate legs to the level of  the heart or above for 30 minutes daily and/or when sitting, a frequency of: - 3-4 times a day throughout the day. Avoid standing for long periods of time. Additional Orders / Instructions: Follow Nutritious Diet WOUND #10: - Lower Leg Wound Laterality: Right, Medial Cleanser: Soap and Water 2 x Per Day/15 Days Discharge Instructions: May shower and wash wound with dial antibacterial soap and water prior to dressing change. Cleanser: Wound Cleanser (DME) (Generic) 2 x Per Day/15 Days Discharge Instructions: Cleanse the wound with wound cleanser prior to applying a clean dressing using gauze sponges, not tissue or cotton balls. Peri-Wound Care: Skin Prep (DME) (Generic) 2 x Per Day/15 Days Discharge Instructions: Use skin prep as directed Prim Dressing: MediHoney Gel, tube 1.5 (oz) 2 x Per Day/15 Days ary Discharge Instructions: Apply to wound bed as instructed Secondary Dressing: Woven Gauze Sponge, Non-Sterile 4x4 in (DME) (Generic) 2 x Per Day/15 Days Discharge Instructions: Apply over primary dressing as directed. Secondary Dressing: Zetuvit Plus Silicone Border Dressing 7x7(in/in) (DME) (Generic) 2 x Per Day/15 Days Discharge Instructions: Apply silicone border over primary dressing as directed. WOUND #11: - Lower Leg Wound Laterality: Right, Posterior Cleanser: Soap and Water 2 x Per Day/15 Days Discharge Instructions: May shower and wash wound with dial antibacterial soap and water prior to dressing change. Cleanser: Wound Cleanser (Generic) 2 x Per Day/15 Days Discharge Instructions: Cleanse the wound with wound cleanser prior to applying a clean dressing using gauze sponges, not tissue or cotton balls. Peri-Wound Care: Skin Prep (DME) (Generic) 2 x Per Day/15 Days Discharge Instructions: Use skin prep as directed Prim Dressing: MediHoney Calcium Alginate  Dressing 4x5 in (DME) (Generic) 2 x Per Day/15 Days ary Discharge Instructions: Apply to wound bed as instructed Secondary  Dressing: Woven Gauze Sponge, Non-Sterile 4x4 in (DME) (Generic) 2 x Per Day/15 Days Discharge Instructions: Apply over primary dressing as directed. Secondary Dressing: Zetuvit Plus Silicone Border Dressing 7x7(in/in) (DME) (Generic) 2 x Per Day/15 Days Discharge Instructions: Apply silicone border over primary dressing as directed. 1. Medihoney 2. Silver alginate 3. Follow-up in 2 weeks Electronic Signature(s) Signed: 03/05/2022 4:25:55 PM By: Geralyn Corwin DO Entered By: Geralyn Corwin on 03/05/2022 16:16:54 -------------------------------------------------------------------------------- HxROS Details Patient Name: Date of Service: Kelly Milling [name] 03/05/2022 3:15 PM Medical Record Number: [id number] Patient Account Number: [id number] Date of Birth/Sex: Treating RN: 1996-06-25 (25 y.o. Kelly [name] Primary Care Provider: Dione Booze Other Clinician: Referring Provider: Treating Provider/Extender: Kelly [name] in Treatment: 55 Information Obtained From Patient Respiratory Medical History: Positive for: Asthma Immunological Medical History: Positive for: Lupus Erythematosus Immunizations Pneumococcal Vaccine: Received Pneumococcal Vaccination: No Implantable Devices None Family and Social History Cancer: No; Diabetes: Yes - Paternal Grandparents; Heart Disease: No; Hereditary Spherocytosis: No; Hypertension: Yes - Paternal Grandparents; Kidney Disease: No; Lung Disease: No; Seizures: No; Stroke: No; Thyroid Problems: Yes - Maternal Grandparents,Paternal Grandparents; Tuberculosis: No; Never smoker; Marital Status - Married; Alcohol Use: Moderate; Drug Use: Current History - Marijuana; Caffeine Use: Daily - Coffee; Financial Concerns: No; Food, Clothing or Shelter Needs: No; Support System Lacking: No Electronic Signature(s) Signed: 03/05/2022 4:25:55 PM By: Geralyn Corwin DO Signed: 03/05/2022 4:44:20 PM By: Fonnie Mu RN Entered By: Geralyn Corwin on 03/05/2022 16:13:44 -------------------------------------------------------------------------------- SuperBill Details Patient Name: Date of Service: Kelly Milling [name] 03/05/2022 Medical Record Number: [id number] Patient Account Number: [id number] Date of Birth/Sex: Treating RN: [birth year] (25 y.o. Kelly [name] Primary Care Provider: Dione Booze Other Clinician: Referring Provider: Treating Provider/Extender: Kelly [name] in Treatment: 55 Diagnosis Coding ICD-10 Codes Code Description [phone] Non-pressure chronic ulcer of other part of left lower leg with fat layer exposed L97.812 Non-pressure chronic ulcer of other part of right lower leg with fat layer exposed M32.9 Systemic lupus erythematosus, unspecified Facility Procedures CPT4 Code: [id number] Description: 99214 - WOUND CARE VISIT-LEV 4 EST PT Modifier: Quantity: 1 Physician Procedures : CPT4 Code Description Modifier [id number] - WC PHYS LEVEL 3 - EST PT ICD-10 Diagnosis Description L97.822 Non-pressure chronic ulcer of other part of left lower leg with fat layer exposed L97.812 Non-pressure chronic ulcer of other part of right  lower leg with fat layer exposed M32.9 Systemic lupus erythematosus, unspecified Quantity: 1 Electronic Signature(s) Signed: 03/05/2022 4:25:55 PM By: Geralyn Corwin DO Entered By: Geralyn Corwin on 03/05/2022 16:17:08

## 2022-03-19 ENCOUNTER — Encounter (HOSPITAL_BASED_OUTPATIENT_CLINIC_OR_DEPARTMENT_OTHER): Payer: BC Managed Care – PPO | Admitting: Internal Medicine

## 2022-03-19 DIAGNOSIS — L97822 Non-pressure chronic ulcer of other part of left lower leg with fat layer exposed: Secondary | ICD-10-CM

## 2022-03-19 DIAGNOSIS — M329 Systemic lupus erythematosus, unspecified: Secondary | ICD-10-CM | POA: Diagnosis not present

## 2022-03-19 DIAGNOSIS — L97812 Non-pressure chronic ulcer of other part of right lower leg with fat layer exposed: Secondary | ICD-10-CM | POA: Diagnosis not present

## 2022-03-26 ENCOUNTER — Ambulatory Visit (INDEPENDENT_AMBULATORY_CARE_PROVIDER_SITE_OTHER): Payer: BC Managed Care – PPO | Admitting: Obstetrics and Gynecology

## 2022-03-26 ENCOUNTER — Encounter: Payer: Self-pay | Admitting: Obstetrics and Gynecology

## 2022-03-26 ENCOUNTER — Other Ambulatory Visit (HOSPITAL_COMMUNITY)
Admission: RE | Admit: 2022-03-26 | Discharge: 2022-03-26 | Disposition: A | Discharge status: Home / Self Care | Payer: BC Managed Care – PPO | Source: Ambulatory Visit | Attending: Obstetrics and Gynecology | Admitting: Obstetrics and Gynecology

## 2022-03-26 VITALS — BP 125/83 | HR 87 | Ht 63.0 in | Wt 137.0 lb

## 2022-03-26 DIAGNOSIS — R350 Frequency of micturition: Secondary | ICD-10-CM | POA: Diagnosis not present

## 2022-03-26 DIAGNOSIS — N760 Acute vaginitis: Secondary | ICD-10-CM | POA: Diagnosis present

## 2022-03-26 DIAGNOSIS — Z113 Encounter for screening for infections with a predominantly sexual mode of transmission: Secondary | ICD-10-CM

## 2022-03-26 LAB — POCT URINALYSIS DIPSTICK
Bilirubin, UA: NEGATIVE
Glucose, UA: NEGATIVE
Ketones, UA: NEGATIVE
Leukocytes, UA: NEGATIVE
Nitrite, UA: NEGATIVE
Protein, UA: NEGATIVE
Spec Grav, UA: 1.01 (ref 1.010–1.025)
Urobilinogen, UA: 0.2 E.U./dL
pH, UA: 6 (ref 5.0–8.0)

## 2022-03-26 LAB — POCT URINE PREGNANCY: Preg Test, Ur: NEGATIVE

## 2022-03-26 NOTE — Progress Notes (Signed)
26 yo P1 with LMP 02/05/22 and BMI 24 presenting today for the evaluation of a 1-week history of vaginal irritation and lower abdominal pain. Patient is sexually active using POP for contraception. Patient denies the presence of a discharge and denies pruritis. She reports vaginal dryness which can be uncomfortable at times. Patient also reports some burning with urination. She reports a negative pregnancy test last week  Past Medical History:  Diagnosis Date   Anxiety    Chest discomfort feb 2017   Pt states due to shortness of breath   Cough present for greater than 3 weeks    Late Entry to [employer]- April 2020- Social Distancing  11/27/2018   Pt signed up for [employer]. Will come to office to pick up BP cuff.    Lupus (systemic lupus erythematosus) (HCC)    dx 2010   Pneumonia    2015   Shortness of breath dyspnea feb 2017   changed inhaler with relief   Past Surgical History:  Procedure Laterality Date   BRONCHOSCOPY  11/2013   CESAREAN SECTION N/A 03/16/2019   Procedure: CESAREAN SECTION;  Surgeon: Levie Heritage, DO;  Location: [facility] LD ORS;  Service: Obstetrics;  Laterality: N/A;   WISDOM TOOTH EXTRACTION  2014   Social History   Tobacco Use   Smoking status: Former    Types: Cigarettes   Smokeless tobacco: Never  Vaping Use   Vaping Use: Never used  Substance Use Topics   Alcohol use: Not Currently   Drug use: Not Currently    Types: Marijuana   ROS See pertinent in HPI. All other systems reviewed and non contributory  Blood pressure 125/83, pulse 87, height 5\' 3"  (1.6 m), weight 137 lb (62.1 kg), last menstrual period 02/05/2022. GENERAL: Well-developed, well-nourished female in no acute distress.  ABDOMEN: Soft, nontender, nondistended. No organomegaly. PELVIC: Normal external female genitalia. Vagina is pink and rugated.  Normal discharge. Normal appearing cervix. Uterus is normal in size. No adnexal mass or tenderness. Chaperone present during the pelvic  exam EXTREMITIES: No cyanosis, clubbing, or edema, 2+ distal pulses.  A/P 26 yo here for evaluation of vaginitis - vaginal swab collected - STI screening per patient request - Urine culture collected - Patient will be contacted with abnormal results - Patient current on pap smear - Patient will be contacted with abnormal results

## 2022-03-26 NOTE — Progress Notes (Signed)
Pt reports vulva and vaginal irritation, dryness, tenderness and "it feels inflamed" x 1 week.  Pt also reports intermittent low abdominal pressure and frequent urination.

## 2022-03-26 NOTE — Addendum Note (Signed)
Addended by: Dalphine Handing on: 03/26/2022 11:36 AM   Modules accepted: Orders

## 2022-03-27 ENCOUNTER — Encounter (HOSPITAL_BASED_OUTPATIENT_CLINIC_OR_DEPARTMENT_OTHER): Payer: Managed Care, Other (non HMO) | Attending: Internal Medicine | Admitting: Internal Medicine

## 2022-03-27 ENCOUNTER — Other Ambulatory Visit: Payer: Self-pay | Admitting: Obstetrics & Gynecology

## 2022-03-27 DIAGNOSIS — M329 Systemic lupus erythematosus, unspecified: Secondary | ICD-10-CM | POA: Insufficient documentation

## 2022-03-27 DIAGNOSIS — L97812 Non-pressure chronic ulcer of other part of right lower leg with fat layer exposed: Secondary | ICD-10-CM | POA: Insufficient documentation

## 2022-03-27 DIAGNOSIS — B9689 Other specified bacterial agents as the cause of diseases classified elsewhere: Secondary | ICD-10-CM

## 2022-03-27 DIAGNOSIS — B3731 Acute candidiasis of vulva and vagina: Secondary | ICD-10-CM

## 2022-03-27 DIAGNOSIS — L97822 Non-pressure chronic ulcer of other part of left lower leg with fat layer exposed: Secondary | ICD-10-CM | POA: Diagnosis present

## 2022-03-27 LAB — CERVICOVAGINAL ANCILLARY ONLY
Bacterial Vaginitis (gardnerella): POSITIVE — AB
Candida Glabrata: NEGATIVE
Candida Vaginitis: POSITIVE — AB
Chlamydia: NEGATIVE
Comment: NEGATIVE
Comment: NEGATIVE
Comment: NEGATIVE
Comment: NEGATIVE
Comment: NEGATIVE
Comment: NORMAL
Neisseria Gonorrhea: NEGATIVE
Trichomonas: NEGATIVE

## 2022-03-27 LAB — HIV ANTIBODY (ROUTINE TESTING W REFLEX): HIV Screen 4th Generation wRfx: NONREACTIVE

## 2022-03-27 LAB — HEPATITIS C ANTIBODY: Hep C Virus Ab: NONREACTIVE

## 2022-03-27 LAB — HEPATITIS B SURFACE ANTIGEN: Hepatitis B Surface Ag: NEGATIVE

## 2022-03-27 LAB — RPR: RPR Ser Ql: NONREACTIVE

## 2022-03-27 MED ORDER — METRONIDAZOLE 500 MG PO TABS: 500.0000 mg | ORAL_TABLET | Freq: Two times a day (BID) | ORAL | 0 refills | Status: AC

## 2022-03-27 MED ORDER — FLUCONAZOLE 150 MG PO TABS: 150.0000 mg | ORAL_TABLET | Freq: Once | ORAL | 3 refills | Status: AC

## 2022-03-27 NOTE — Progress Notes (Signed)
Kelly [name], Kelly [name] ([id number]) Visit Report for 03/27/2022 Arrival Information Details Patient Name: Date of Service: Kelly [name], Kelly [name] 03/27/2022 2:15 PM Medical Record Number: [id number] Patient Account Number: [id number] Date of Birth/Sex: Treating RN: 10-08-1995 (26 y.o. [name], Tammi Klippel Primary Care Maicee Ullman: [name] Other Clinician: Referring Daelon Dunivan: Treating Edgard Debord/Extender: Elsworth Soho in Treatment: 58 Visit Information History Since Last Visit Added or deleted any medications: No Patient Arrived: Ambulatory Any new allergies or adverse reactions: No Arrival Time: 14:36 Had a fall or experienced change in No Accompanied By: self activities of daily living that may affect Transfer Assistance: None risk of falls: Patient Identification Verified: Yes Signs or symptoms of abuse/neglect since last visito No Secondary Verification Process Completed: Yes Hospitalized since last visit: No Patient Requires Transmission-Based Precautions: No Implantable device outside of the clinic excluding No Patient Has Alerts: Yes cellular tissue based products placed in the center Patient Alerts: Left ABi=1.18 since last visit: Has Dressing in Place as Prescribed: Yes Pain Present Now: Yes Electronic Signature(s) Signed: 03/27/2022 4:26:53 PM By: Erenest Blank Entered By: Erenest Blank on 03/27/2022 14:37:27 -------------------------------------------------------------------------------- Clinic Level of Care Assessment Details Patient Name: Date of Service: Kelly [name], JUHASZ. 03/27/2022 2:15 PM Medical Record Number: [id number] Patient Account Number: [id number] Date of Birth/Sex: Treating RN: 18-Nov-1995 (26 y.o. [name], [name] Primary Care Abubakr Wieman: [name] Other Clinician: Referring Tessi Eustache: Treating Ondrea Dow/Extender: Elsworth Soho in Treatment: 58 Clinic Level of Care Assessment Items TOOL 4  Quantity Score X- 1 0 Use when only an EandM is performed on FOLLOW-UP visit ASSESSMENTS - Nursing Assessment / Reassessment X- 1 10 Reassessment of Co-morbidities (includes updates in patient status) X- 1 5 Reassessment of Adherence to Treatment Plan ASSESSMENTS - Wound and Skin A ssessment / Reassessment _0  - 0 Simple Wound Assessment / Reassessment - one wound X- 3 5 Complex Wound Assessment / Reassessment - multiple wounds X- 1 10 Dermatologic / Skin Assessment (not related to wound area) ASSESSMENTS - Focused Assessment X- 1 5 Circumferential Edema Measurements - multi extremities _1  - 0 Nutritional Assessment / Counseling / Intervention _2  - 0 Lower Extremity Assessment (monofilament, tuning fork, pulses) _3  - 0 Peripheral Arterial Disease Assessment (using hand held doppler) ASSESSMENTS - Ostomy and/or Continence Assessment and Care _4  - 0 Incontinence Assessment and Management _5  - 0 Ostomy Care Assessment and Management (repouching, etc.) PROCESS - Coordination of Care _6  - 0 Simple Patient / Family Education for ongoing care X- 1 20 Complex (extensive) Patient / Family Education for ongoing care X- 1 10 Staff obtains Programmer, systems, Records, T Results / Process Orders est _7  - 0 Staff telephones HHA, Nursing Homes / Clarify orders / etc _8  - 0 Routine Transfer to another Facility (non-emergent condition) _9  - 0 Routine Hospital Admission (non-emergent condition) _10  - 0 New Admissions / Biomedical engineer / Ordering NPWT Apligraf, etc. , _11  - 0 Emergency Hospital Admission (emergent condition) _12  - 0 Simple Discharge Coordination X- 1 15 Complex (extensive) Discharge Coordination PROCESS - Special Needs _13  - 0 Pediatric / Minor Patient Management _14  - 0 Isolation Patient Management _15  - 0 Hearing / Language / Visual special needs _16  - 0 Assessment of Community assistance (transportation, D/C planning, etc.) _17  - 0 Additional assistance / Altered  mentation _18  - 0 Support Surface(s) Assessment (bed, cushion, seat, etc.) INTERVENTIONS - Wound Cleansing / Measurement _19  - 0 Simple Wound Cleansing - one wound X- 3 5 Complex Wound Cleansing - multiple  wounds X- 1 5 Wound Imaging (photographs - any number of wounds) _0  - 0 Wound Tracing (instead of photographs) _1  - 0 Simple Wound Measurement - one wound X- 3 5 Complex Wound Measurement - multiple wounds INTERVENTIONS - Wound Dressings X - Small Wound Dressing one or multiple wounds 2 10 _2  - 0 Medium Wound Dressing one or multiple wounds _3  - 0 Large Wound Dressing one or multiple wounds <[id number]>_8<\/[id number]>_3  - 0 Application of Medications - topical <[id number]>_9<\/[id number]>_9  - 0 Application of Medications - injection INTERVENTIONS - Miscellaneous _6  - 0 External ear exam _7  - 0 Specimen Collection (cultures, biopsies, blood, body fluids, etc.) _8  - 0 Specimen(s) / Culture(s) sent or taken to Lab for analysis _9  - 0 Patient Transfer (multiple staff / Civil Service fast streamer / Similar devices) _10  - 0 Simple Staple / Suture removal (25 or less) _11  - 0 Complex Staple / Suture removal (26 or more) _12  - 0 Hypo / Hyperglycemic Management (close monitor of Blood Glucose) _13  - 0 Ankle / Brachial Index (ABI) - do not check if billed separately X- 1 5 Vital Signs Has the patient been seen at the hospital within the last three years: Yes Total Score: 150 Level Of Care: New/Established - Level 4 Electronic Signature(s) Signed: 03/27/2022 4:41:40 PM By: Deon Pilling RN, BSN Entered By: Deon Pilling on 03/27/2022 15:17:57 -------------------------------------------------------------------------------- Encounter Discharge Information Details Patient Name: Date of Service: Kelly Lou [name] 03/27/2022 2:15 PM Medical Record Number: [id number] Patient Account Number: [id number] Date of Birth/Sex: Treating RN: [birth year] (26 y.o. [name], Tammi Klippel Primary Care Tashea Othman: [name] Other Clinician: Referring  Akeila Lana: Treating Tong Pieczynski/Extender: Elsworth Soho in Treatment: 54 Encounter Discharge Information Items Discharge Condition: Stable Ambulatory Status: Ambulatory Discharge Destination: Home Transportation: Private Auto Accompanied By: self Schedule Follow-up Appointment: Yes Clinical Summary of Care: Electronic Signature(s) Signed: 03/27/2022 4:41:40 PM By: Deon Pilling RN, BSN Entered By: Deon Pilling on 03/27/2022 15:18:23 -------------------------------------------------------------------------------- Lower Extremity Assessment Details Patient Name: Date of Service: Kelly Lou RIA Cipriano Bunker. 03/27/2022 2:15 PM Medical Record Number: [id number] Patient Account Number: [id number] Date of Birth/Sex: Treating RN: [birth year] (26 y.o. [name], Tammi Klippel Primary Care Roshard Rezabek: [name] Other Clinician: Referring Rayleen Wyrick: Treating Wilman Tucker/Extender: Elsworth Soho in Treatment: 58 Edema Assessment Assessed: Shirlyn Goltz: No] [Right: No] Edema: [Left: Yes] [Right: Yes] Calf Left: Right: Point of Measurement: 27 cm From Medial Instep 33 cm Ankle Left: Right: Point of Measurement: 8 cm From Medial Instep 23.8 cm Electronic Signature(s) Signed: 03/27/2022 4:26:53 PM By: Erenest Blank Signed: 03/27/2022 4:41:40 PM By: Deon Pilling RN, BSN Entered By: Erenest Blank on 03/27/2022 14:55:45 -------------------------------------------------------------------------------- Multi Wound Chart Details Patient Name: Date of Service: Kelly Lou [name] 03/27/2022 2:15 PM Medical Record Number: [id number] Patient Account Number: [id number] Date of Birth/Sex: Treating RN: [birth year] (26 y.o. [name], [name] Primary Care Tajai Suder: [name] Other Clinician: Referring Oaklen Thiam: Treating Tion Tse/Extender: Elsworth Soho in Treatment: 58 Vital Signs Height(in): 62 Pulse(bpm): 109 Weight(lbs): Blood  Pressure(mmHg): 125/83 Body Mass Index(BMI): Temperature(F): 98.4 Respiratory Rate(breaths/min): 18 Photos: Right, Medial Lower Leg Right, Posterior Lower Leg Right, Anterior Lower Leg Wound Location: Gradually Appeared Gradually Appeared Gradually Appeared Wounding Event: Lupus Lupus Auto-immune Primary Etiology: Asthma, Lupus Erythematosus Asthma, Lupus Erythematosus Asthma, Lupus Erythematosus Comorbid History: 12/18/2021 12/25/2021 03/19/2022 Date Acquired: _14 Weeks of Treatment: Open Open Open Wound Status: No No No Wound Recurrence: Yes No No Clustered Wound: 2 N/A N/A Clustered Quantity: 5.5x4.5x1.5 0x0x0 0.8x0.7x0.2  Measurements L x W x D (cm) 19.439 0 0.44 A (cm) : rea 29.158 0 0.088 Volume (cm) : -57.10% 100.00% 22.10% % Reduction in A rea: -685.70% 100.00% 22.10% % Reduction in Volume: Full Thickness With Exposed Support Full Thickness With Exposed Support Full Thickness Without Exposed Classification: Structures Structures Support Structures Medium Medium Medium Exudate Amount: Serosanguineous Serosanguineous Serosanguineous Exudate Type: red, brown red, brown red, brown Exudate Color: Well defined, not attached Distinct, outline attached N/A Wound Margin: Large (67-100%) None Present (0%) Small (1-33%) Granulation Amount: Red, Pink N/A Red Granulation Quality: Small (1-33%) None Present (0%) Large (67-100%) Necrotic Amount: Fat Layer (Subcutaneous Tissue): Yes Fascia: No Fat Layer (Subcutaneous Tissue): Yes Exposed Structures: Tendon: Yes Fat Layer (Subcutaneous Tissue): No Fascia: No Tendon: No Muscle: No Muscle: No Joint: No Joint: No Bone: No Bone: No None Large (67-100%) None Epithelialization: Treatment Notes Wound #10 (Lower Leg) Wound Laterality: Right, Medial Cleanser Soap and Water Discharge Instruction: May shower and wash wound with dial antibacterial soap and water prior to dressing change. Wound Cleanser Discharge  Instruction: Cleanse the wound with wound cleanser prior to applying a clean dressing using gauze sponges, not tissue or cotton balls. Peri-Wound Care Skin Prep Discharge Instruction: Use skin prep as directed Topical Primary Dressing Dakin's Solution 0.25%, 16 (oz) Discharge Instruction: Moisten gauze with Dakin's solution Secondary Dressing Woven Gauze Sponge, Non-Sterile 4x4 in Discharge Instruction: Apply over primary dressing as directed. Zetuvit Plus Silicone Border Dressing 7x7(in/in) Discharge Instruction: Apply silicone border over primary dressing as directed. Secured With Compression Wrap Compression Stockings Add-Ons Wound #11 (Lower Leg) Wound Laterality: Right, Posterior Cleanser Peri-Wound Care Topical Primary Dressing Secondary Dressing Secured With Compression Wrap Compression Stockings Add-Ons Wound #12 (Lower Leg) Wound Laterality: Right, Anterior Cleanser Soap and Water Discharge Instruction: May shower and wash wound with dial antibacterial soap and water prior to dressing change. Wound Cleanser Discharge Instruction: Cleanse the wound with wound cleanser prior to applying a clean dressing using gauze sponges, not tissue or cotton balls. Peri-Wound Care Skin Prep Discharge Instruction: Use skin prep as directed Topical Primary Dressing MediHoney Calcium Alginate Dressing 4x5 in Discharge Instruction: Apply to wound bed as instructed Secondary Dressing ALLEVYN Gentle Border, 3x3 (in/in) Discharge Instruction: Apply over primary dressing as directed. Woven Gauze Sponge, Non-Sterile 4x4 in Discharge Instruction: Apply over primary dressing as directed. Secured With Compression Wrap Compression Stockings Environmental education officer) Signed: 03/27/2022 3:24:10 PM By: Kalman Shan DO Signed: 03/27/2022 4:41:40 PM By: Deon Pilling RN, BSN Entered By: Kalman Shan on 03/27/2022  15:21:34 -------------------------------------------------------------------------------- Multi-Disciplinary Care Plan Details Patient Name: Date of Service: Kelly Lou [name] 03/27/2022 2:15 PM Medical Record Number: [id number] Patient Account Number: [id number] Date of Birth/Sex: Treating RN: April 30, 1996 (26 y.o. [name], Tammi Klippel Primary Care Emaan Gary: [name] Other Clinician: Referring Raylen Tangonan: Treating Lora Chavers/Extender: Elsworth Soho in Treatment: 58 Multidisciplinary Care Plan reviewed with physician Active Inactive Wound/Skin Impairment Nursing Diagnoses: Impaired tissue integrity Goals: Patient/caregiver will verbalize understanding of skin care regimen Date Initiated: 02/13/2021 Target Resolution Date: 04/27/2022 Goal Status: Active Ulcer/skin breakdown will have a volume reduction of 30% by week 4 Date Initiated: 02/13/2021 Date Inactivated: 04/21/2021 Target Resolution Date: 03/13/2021 Unmet Reason: pt did not return to Goal Status: Unmet clinic for F/U Ulcer/skin breakdown will have a volume reduction of 80% by week 12 Date Initiated: 04/21/2021 Date Inactivated: 07/13/2021 Target Resolution Date: 07/27/2021 Goal Status: Met Interventions: Assess patient/caregiver ability to obtain necessary supplies Assess patient/caregiver ability to perform ulcer/skin  care regimen upon admission and as needed Assess ulceration(s) every visit Provide education on ulcer and skin care Treatment Activities: Skin care regimen initiated : 02/13/2021 Topical wound management initiated : 02/13/2021 Notes: 06/29/21: Wounds 3 and 6 only at 80% volume reduction. Target date extended. Electronic Signature(s) Signed: 03/27/2022 4:41:40 PM By: Deon Pilling RN, BSN Entered By: Deon Pilling on 03/27/2022 15:04:47 -------------------------------------------------------------------------------- Pain Assessment Details Patient Name: Date of Service: Kelly Lou RIA Cipriano Bunker. 03/27/2022 2:15 PM Medical Record Number: [id number] Patient Account Number: [id number] Date of Birth/Sex: Treating RN: 04-Oct-1995 (26 y.o. Debby Bud Primary Care Avrielle Fry: Other Clinician: [name] Referring Naiyah Klostermann: Treating Jazmon Kos/Extender: Elsworth Soho in Treatment: 58 Active Problems Location of Pain Severity and Description of Pain Patient Has Paino Yes Site Locations Pain Location: Pain in Ulcers Rate the pain. Current Pain Level: 5 Pain Management and Medication Current Pain Management: Electronic Signature(s) Signed: 03/27/2022 4:26:53 PM By: Erenest Blank Signed: 03/27/2022 4:41:40 PM By: Deon Pilling RN, BSN Entered By: Erenest Blank on 03/27/2022 14:38:02 -------------------------------------------------------------------------------- Patient/Caregiver Education Details Patient Name: Date of Service: Kelly [name] 8/1/2023andnbsp2:15 PM Medical Record Number: [id number] Patient Account Number: [id number] Date of Birth/Gender: Treating RN: Mar 28, 1996 (26 y.o. Debby Bud Primary Care Physician: [name] Other Clinician: Referring Physician: Treating Physician/Extender: Elsworth Soho in Treatment: 51 Education Assessment Education Provided To: Patient Education Topics Provided Wound/Skin Impairment: Handouts: Skin Care Do's and Dont's Methods: Explain/Verbal Responses: Reinforcements needed Electronic Signature(s) Signed: 03/27/2022 4:41:40 PM By: Deon Pilling RN, BSN Entered By: Deon Pilling on 03/27/2022 15:04:59 -------------------------------------------------------------------------------- Wound Assessment Details Patient Name: Date of Service: Kelly Lou RIA Cipriano Bunker. 03/27/2022 2:15 PM Medical Record Number: [id number] Patient Account Number: [id number] Date of Birth/Sex: Treating RN: 1996/07/24 (26 y.o. [name], [name] Primary Care Brix Brearley:  [name] Other Clinician: Referring Kekoa Fyock: Treating Taylorann Tkach/Extender: Elsworth Soho in Treatment: 58 Wound Status Wound Number: 10 Primary Etiology: Lupus Wound Location: Right, Medial Lower Leg Wound Status: Open Wounding Event: Gradually Appeared Comorbid History: Asthma, Lupus Erythematosus Date Acquired: 12/18/2021 Weeks Of Treatment: 14 Clustered Wound: Yes Photos Wound Measurements Length: (cm) 5.5 Width: (cm) 4.5 Depth: (cm) 1.5 Clustered Quantity: 2 Area: (cm) 19.439 Volume: (cm) 29.158 % Reduction in Area: -57.1% % Reduction in Volume: -685.7% Epithelialization: None Tunneling: No Undermining: No Wound Description Classification: Full Thickness With Exposed Support Structures Wound Margin: Well defined, not attached Exudate Amount: Medium Exudate Type: Serosanguineous Exudate Color: red, brown Foul Odor After Cleansing: No Slough/Fibrino Yes Wound Bed Granulation Amount: Large (67-100%) Exposed Structure Granulation Quality: Red, Pink Fascia Exposed: No Necrotic Amount: Small (1-33%) Fat Layer (Subcutaneous Tissue) Exposed: Yes Necrotic Quality: Adherent Slough Tendon Exposed: Yes Muscle Exposed: No Joint Exposed: No Bone Exposed: No Treatment Notes Wound #10 (Lower Leg) Wound Laterality: Right, Medial Cleanser Soap and Water Discharge Instruction: May shower and wash wound with dial antibacterial soap and water prior to dressing change. Wound Cleanser Discharge Instruction: Cleanse the wound with wound cleanser prior to applying a clean dressing using gauze sponges, not tissue or cotton balls. Peri-Wound Care Skin Prep Discharge Instruction: Use skin prep as directed Topical Primary Dressing Dakin's Solution 0.25%, 16 (oz) Discharge Instruction: Moisten gauze with Dakin's solution Secondary Dressing Woven Gauze Sponge, Non-Sterile 4x4 in Discharge Instruction: Apply over primary dressing as  directed. Zetuvit Plus Silicone Border Dressing 7x7(in/in) Discharge Instruction: Apply silicone border over primary dressing as directed. Secured With Compression Wrap Compression Stockings Environmental education officer) Signed:  03/27/2022 4:26:53 PM By: Erenest Blank Signed: 03/27/2022 4:41:40 PM By: Deon Pilling RN, BSN Entered By: Erenest Blank on 03/27/2022 14:59:39 -------------------------------------------------------------------------------- Wound Assessment Details Patient Name: Date of Service: Kelly Lou RIA Cipriano Bunker. 03/27/2022 2:15 PM Medical Record Number: [id number] Patient Account Number: [id number] Date of Birth/Sex: Treating RN: Jul 18, 1996 (26 y.o. [name], [name] Primary Care Lottie Sigman: [name] Other Clinician: Referring Jabarie Pop: Treating Annastyn Silvey/Extender: Elsworth Soho in Treatment: 58 Wound Status Wound Number: 11 Primary Etiology: Lupus Wound Location: Right, Posterior Lower Leg Wound Status: Open Wounding Event: Gradually Appeared Comorbid History: Asthma, Lupus Erythematosus Date Acquired: 12/25/2021 Weeks Of Treatment: 12 Clustered Wound: No Photos Wound Measurements Length: (cm) Width: (cm) Depth: (cm) Area: (cm) Volume: (cm) 0 % Reduction in Area: 100% 0 % Reduction in Volume: 100% 0 Epithelialization: Large (67-100%) 0 Tunneling: No 0 Undermining: No Wound Description Classification: Full Thickness With Exposed Support Structures Wound Margin: Distinct, outline attached Exudate Amount: Medium Exudate Type: Serosanguineous Exudate Color: red, brown Foul Odor After Cleansing: No Slough/Fibrino Yes Wound Bed Granulation Amount: None Present (0%) Exposed Structure Necrotic Amount: None Present (0%) Fascia Exposed: No Fat Layer (Subcutaneous Tissue) Exposed: No Tendon Exposed: No Muscle Exposed: No Joint Exposed: No Bone Exposed: No Electronic Signature(s) Signed: 03/27/2022 4:26:53 PM By: Erenest Blank Signed: 03/27/2022 4:41:40 PM By: Deon Pilling RN, BSN Entered By: Erenest Blank on 03/27/2022 14:58:34 -------------------------------------------------------------------------------- Wound Assessment Details Patient Name: Date of Service: Kelly Lou RIA Cipriano Bunker. 03/27/2022 2:15 PM Medical Record Number: [id number] Patient Account Number: [id number] Date of Birth/Sex: Treating RN: 11/01/1995 (26 y.o. [name], [name] Primary Care Porchea Charrier: [name] Other Clinician: Referring Melonie Germani: Treating Yeng Frankie/Extender: Elsworth Soho in Treatment: 58 Wound Status Wound Number: 12 Primary Etiology: Auto-immune Wound Location: Right, Anterior Lower Leg Wound Status: Open Wounding Event: Gradually Appeared Comorbid History: Asthma, Lupus Erythematosus Date Acquired: 03/19/2022 Weeks Of Treatment: 1 Clustered Wound: No Photos Wound Measurements Length: (cm) 0.8 Width: (cm) 0.7 Depth: (cm) 0.2 Area: (cm) 0.44 Volume: (cm) 0.088 % Reduction in Area: 22.1% % Reduction in Volume: 22.1% Epithelialization: None Tunneling: No Undermining: No Wound Description Classification: Full Thickness Without Exposed Support Structures Exudate Amount: Medium Exudate Type: Serosanguineous Exudate Color: red, brown Foul Odor After Cleansing: No Slough/Fibrino Yes Wound Bed Granulation Amount: Small (1-33%) Exposed Structure Granulation Quality: Red Fat Layer (Subcutaneous Tissue) Exposed: Yes Necrotic Amount: Large (67-100%) Necrotic Quality: Adherent Slough Treatment Notes Wound #12 (Lower Leg) Wound Laterality: Right, Anterior Cleanser Soap and Water Discharge Instruction: May shower and wash wound with dial antibacterial soap and water prior to dressing change. Wound Cleanser Discharge Instruction: Cleanse the wound with wound cleanser prior to applying a clean dressing using gauze sponges, not tissue or cotton balls. Peri-Wound Care Skin  Prep Discharge Instruction: Use skin prep as directed Topical Primary Dressing MediHoney Calcium Alginate Dressing 4x5 in Discharge Instruction: Apply to wound bed as instructed Secondary Dressing ALLEVYN Gentle Border, 3x3 (in/in) Discharge Instruction: Apply over primary dressing as directed. Woven Gauze Sponge, Non-Sterile 4x4 in Discharge Instruction: Apply over primary dressing as directed. Secured With Compression Wrap Compression Stockings Environmental education officer) Signed: 03/27/2022 4:26:53 PM By: Erenest Blank Signed: 03/27/2022 4:41:40 PM By: Deon Pilling RN, BSN Entered By: Erenest Blank on 03/27/2022 14:57:33 -------------------------------------------------------------------------------- Vitals Details Patient Name: Date of Service: Kelly Lou [name] 03/27/2022 2:15 PM Medical Record Number: [id number] Patient Account Number: [id number] Date of Birth/Sex: Treating RN: May 23, 1996 (27 y.o. Debby Bud Primary Care Gibril Mastro: Johnn Hai,  Jeneen Rinks Other Clinician: Referring Brelan Hannen: Treating Toshiye Kever/Extender: Elsworth Soho in Treatment: 58 Vital Signs Time Taken: 14:40 Temperature (F): 98.4 Height (in): 62 Pulse (bpm): 109 Respiratory Rate (breaths/min): 18 Blood Pressure (mmHg): 125/83 Reference Range: 80 - 120 mg / dl Electronic Signature(s) Signed: 03/27/2022 4:26:53 PM By: Erenest Blank Entered By: Erenest Blank on 03/27/2022 14:41:10

## 2022-03-27 NOTE — Progress Notes (Signed)
RAENAH, MURLEY ([id number]) Visit Report for 03/27/2022 Chief Complaint Document Details Patient Name: Date of Service: Kelly [name], Kelly [name]. 03/27/2022 2:15 PM Medical Record Number: [id number] Patient Account Number: [id number] Date of Birth/Sex: Treating RN: 04-04-1996 (26 y.o. Kelly [name], Kelly [name] Primary Care Provider: Dione Booze Other Clinician: Referring Provider: Treating Provider/Extender: Gennaro Africa in Treatment: 78 Information Obtained from: Patient Chief Complaint Bilateral lower extremity wounds 04/21/21: left lower extremity wound s/p cellulitis 09/01/21; right lower extremity wound Electronic Signature(s) Signed: 03/27/2022 3:24:10 PM By: Geralyn Corwin DO Entered By: Geralyn Corwin on 03/27/2022 15:21:39 -------------------------------------------------------------------------------- HPI Details Patient Name: Date of Service: Kelly Milling [name] 03/27/2022 2:15 PM Medical Record Number: [id number] Patient Account Number: [id number] Date of Birth/Sex: Treating RN: Jul 11, 1996 (26 y.o. Kelly [name] Primary Care Provider: Dione Booze Other Clinician: Referring Provider: Treating Provider/Extender: Gennaro Africa in Treatment: 78 History of Present Illness HPI Description: Admission 6/21 Kelly [name] is A 26 year old female with a past medical history of systemic lupus erythematosus that presents with bilateral lower extremity wounds that started in April 2022. She has had skin issues in the past where she developed very small wounds but these healed with time. She has never had open wounds like she does on her legs. She currently reports minimal drainage to the wounds. She does have pain to these areas but has been overall stable. She denies signs of infection. She has visited the ED for this issue and was recently prescribed doxycycline for possible cellulitis. She follows with [facility]  for her SLE and is currently on Plaquenil, prednisone and CellCept. 6/27; patient presents for 1 week follow-up. She states she saw her rheumatologist on 6/22 and prednisone was increased from 10 mg to 20 mg daily. She is currently at the highest dose of Plaquenil and CellCept and is going to try an infusion later today at her regimen. She reports improvement to the wounds. She has been using collagen with dressing changes. She currently denies signs of infection. Readmission 8/26 Patient presents to clinic today for open wounds to her left lower extremity. Her previous wounds that were treated have closed. She states that on 8/9 she developed cellulitis and was treated with 2 rounds of antibiotics. Her cellulitis has resolved. She had blisters that opened and are now large wounds. She has seen her dermatologist and rheumatologist since she has systemic lupus and a hard time healing. No changes have been made to her medications. She was recommended antibiotic ointment by her dermatologist to her wounds She picked up the antibiotic ointment today.. She reports pain to these areas. She denies purulent drainage, increased warmth or erythema to the left lower extremity. She currently keeps the areas covered. Of note she had an Achilles tendon rupture on her right leg and is currently in a soft cast. 9/13; patient presents for follow-up. She missed her last clinic appointment. She has been using Santyl to the wound beds daily. She has no issues or complaints today. She reports the Santyl is helping clean up the wounds. She currently denies signs of infection. 9/22; patient states that 1 week ago she was seen by dermatology and they placed an Unna boot with Bactroban on her left lower extremity. Today she presents with a Unna boot in place. She currently denies signs of infection. 10/7; patient presents for follow-up. She reports improvement. She has been using Santyl to the areas of nonviable tissue and  collagen to granulation tissue. She  has been approved for infusions for her lupus. She is going to start these soon. These will be injection she can do at home. She currently denies signs of infection. 10/21; patient presents for follow-up. She reports continued improvement to her wound healing. She has been using Santyl to all areas except for the superior posterior left leg wound and she is using collagen to this. She has started her injection therapy for lupus. She started 1 week ago. She currently denies signs of infection. 11/3; patient presents for follow-up. She has no issues or complaints today. She is on her third week of injection therapy for lupus and tolerating this well. She has no issues or complaints today. She denies signs of infection. 11/17; patient presents for 2-week follow-up. She reports improvement in wound healing. She denies signs of infection. 12/1; patient presents for follow-up. She has been using collagen to the anterior left lower extremity wounds and Santyl to the left lateral malleolus. She has no issues or complaints today. 12/15; patient presents for follow-up. She is using collagen to the 2 anterior left leg wounds and Santyl to the left lateral malleolus wound. She reports improvement in wound healing. She denies signs of infection. 12/22; patient presents because she is having purulent drainage from her anterior left leg wound. She reports no inciting event and states that the area started draining spontaneously. She was hoping that the drainage would stop however this has not improved. She states this started 5 days ago. She reports tenderness to the wound bed. 12/29; patient presents for follow-up. She was able to take Augmentin for 4 days but did not tolerate the GI side effects. She continues to take doxycycline. She reports improvement in drainage. She no longer has increased warmth to the surrounding wound bed. She ran out of [place] and has been using  Vaseline to the wound beds. 1/6; Patient presents for follow-up. She has been taking Keflex without issues. She reports improvement in drainage and wound healing T the left lower o extremity. Unfortunately she developed another wound spontaneously to the right anterior lower extremity. She denies systemic signs of infection. 1/16; patient presents for follow-up. Patient completed her first prescription of Keflex and has not started the second. She reports no more drainage to the wound beds. She states there was an issue with insurance however she is going to be able to obtain Santyl soon. She has been using Dakin's wet-to-dry dressings to the right anterior leg and left lateral ankle. She has been using collagen to the distal anterior left leg wound and reports that the proximal anterior left leg wound has healed. She denies systemic signs of infection. 1/31; patient presents for follow-up. She reports improvement to the left leg wounds. She reports increased inflammation to the right leg wound. She thinks she is having a reaction to the foam border dressing on the right leg. She is unable to obtain Santyl. She has been using Dakin's wet-to-dry dressings to the right anterior leg and left lateral ankle. She has been using collagen to the distal anterior left leg wound. 2/13; patient presents for follow-up. She reports improvement in her wound healing. One of the wounds to the anterior aspect of the left lower leg has slightly reopened. She denies signs of infection. 2/27; patient presents for follow-up. She has no issues or complaints today. She denies signs of infection. 3/20; patient presents for follow-up. She has been using Medihoney to the right lower extremity wound and left lateral ankle wound. She continues  to use collagen to the left anterior leg wound. She has no issues or complaints today. She denies signs of infection. She reports improvement in wound 4/10; patient has been using  Medihoney to the right anterior lower leg wound and silver collagen on the left including the left ankle and left anterior. Fortunately the left anterior lower leg wound is healed. Both of the areas on the left ankle and the right anterior lower leg appear clean. These wounds are in the setting of chronic lupus. She has had multitude of chronic wounds and a rash on her arms. She tells me she has had both her arms and legs biopsied by dermatology at [facility] and also follows with rheumatology at [facility] although she is not had an appointment this year. She does not have a history of blood clots either arterial or venous. 4/24; patient presents for follow-up. She has been using collagen to the open wound beds. She has developed new areas to her right lower leg. She currently denies signs of infection. 5/9; patient presents for follow-up. She states she is seeing her rheumatologist next week. She has been using Medihoney to all the wound beds except for the lateral left malleolus. She has been using collegen here. She reports improvement to some areas and other areas are flaring due to her lupus. She currently denies signs of infection. 5/23; Patient saw her rheumatologist last week. She has no active lupus activity at this time. Other options for treatment were given. She is going to continue with her current therapy of weekly injections. She reports improvement to Her wound healing on the left ankle. She has been putting Medihoney here. On the right lower extremity she has had a flareup and has 2 open ulcers. She currently denies signs of infection including increased warmth, erythema or purulent drainage. She has been using Medihoney here as well. 7/10; patient presents for follow-up. She was evaluated in the ED on 6/21 and prescribed Doxycycline For worsening pain to her right lower extremity with increased erythema. She subsequently developed a systemic reaction from the antibiotic and had A diffuse rash.  There was concern that her lupus is not well controlled. It was recommended that she start Belimumab. She states she is starting the infusions next week. She has using silver alginate to the wound beds. She currently denies systemic signs of infection. 7/24; patient presents for follow-up. She started her infusion on 7/18 and tolerated this well. She has been using Medihoney to the wound beds. The anterior right leg wound has increased in size. She is currently on Keflex prescribed by her primary care physician. She denies signs of systemic infection. 8/1; patient presents for follow-up. She has been using Medihoney and Dakin's wet-to-dry dressings to the wound beds. She has no issues or complaints today. She denies signs of infection. The posterior right leg wound has healed. Electronic Signature(s) Signed: 03/27/2022 3:24:10 PM By: Geralyn Corwin DO Entered By: Geralyn Corwin on 03/27/2022 15:22:14 -------------------------------------------------------------------------------- Physical Exam Details Patient Name: Date of Service: Kelly Milling RIA Cherly Anderson. 03/27/2022 2:15 PM Medical Record Number: [id number] Patient Account Number: [id number] Date of Birth/Sex: Treating RN: 05-Jul-1996 (26 y.o. Kelly [name] Primary Care Provider: Dione Booze Other Clinician: Referring Provider: Treating Provider/Extender: Gennaro Africa in Treatment: 58 Constitutional respirations regular, non-labored and within target range for patient.. Cardiovascular 2+ dorsalis pedis/posterior tibialis pulses. Psychiatric pleasant and cooperative. Notes Right lower extremity: 3 open wounds with granulation tissue and scant nonviable tissue. No surrounding soft  tissue infection. Electronic Signature(s) Signed: 03/27/2022 3:24:10 PM By: Geralyn Corwin DO Entered By: Geralyn Corwin on 03/27/2022  15:22:47 -------------------------------------------------------------------------------- Physician Orders Details Patient Name: Date of Service: Kelly Milling RIA Cherly Anderson. 03/27/2022 2:15 PM Medical Record Number: [id number] Patient Account Number: [id number] Date of Birth/Sex: Treating RN: March 03, 1996 (26 y.o. Kelly [name], Millard.Loa Primary Care Provider: Dione Booze Other Clinician: Referring Provider: Treating Provider/Extender: Gennaro Africa in Treatment: 79 Verbal / Phone Orders: No Diagnosis Coding ICD-10 Coding Code Description [phone] Non-pressure chronic ulcer of other part of left lower leg with fat layer exposed L97.812 Non-pressure chronic ulcer of other part of right lower leg with fat layer exposed M32.9 Systemic lupus erythematosus, unspecified Follow-up Appointments ppointment in 2 weeks. - Dr Mikey Bussing - [name] 315pm 04/10/2022 Tuesday Return A Other: - taking two Lovenox  injections daily Bathing/ Shower/ Hygiene May shower and wash wound with soap and water. Edema Control - Lymphedema / SCD / Other Elevate legs to the level of the heart or above for 30 minutes daily and/or when sitting, a frequency of: - 3-4 times a day throughout the day. Avoid standing for long periods of time. Additional Orders / Instructions Follow Nutritious Diet Wound Treatment Wound #10 - Lower Leg Wound Laterality: Right, Medial Cleanser: Soap and Water 2 x Per Day/15 Days Discharge Instructions: May shower and wash wound with dial antibacterial soap and water prior to dressing change. Cleanser: Wound Cleanser (Generic) 2 x Per Day/15 Days Discharge Instructions: Cleanse the wound with wound cleanser prior to applying a clean dressing using gauze sponges, not tissue or cotton balls. Peri-Wound Care: Skin Prep (Generic) 2 x Per Day/15 Days Discharge Instructions: Use skin prep as directed Prim Dressing: Dakin's Solution 0.25%, 16 (oz) 2 x Per Day/15  Days ary Discharge Instructions: Moisten gauze with Dakin's solution Secondary Dressing: Woven Gauze Sponge, Non-Sterile 4x4 in (Generic) 2 x Per Day/15 Days Discharge Instructions: Apply over primary dressing as directed. Secondary Dressing: Zetuvit Plus Silicone Border Dressing 7x7(in/in) (Generic) 2 x Per Day/15 Days Discharge Instructions: Apply silicone border over primary dressing as directed. Wound #12 - Lower Leg Wound Laterality: Right, Anterior Cleanser: Soap and Water 2 x Per Day/15 Days Discharge Instructions: May shower and wash wound with dial antibacterial soap and water prior to dressing change. Cleanser: Wound Cleanser (Generic) 2 x Per Day/15 Days Discharge Instructions: Cleanse the wound with wound cleanser prior to applying a clean dressing using gauze sponges, not tissue or cotton balls. Peri-Wound Care: Skin Prep (Generic) 2 x Per Day/15 Days Discharge Instructions: Use skin prep as directed Prim Dressing: MediHoney Calcium Alginate Dressing 4x5 in 2 x Per Day/15 Days ary Discharge Instructions: Apply to wound bed as instructed Secondary Dressing: ALLEVYN Gentle Border, 3x3 (in/in) (Generic) 2 x Per Day/15 Days Discharge Instructions: Apply over primary dressing as directed. Secondary Dressing: Woven Gauze Sponge, Non-Sterile 4x4 in (Generic) 2 x Per Day/15 Days Discharge Instructions: Apply over primary dressing as directed. Electronic Signature(s) Signed: 03/27/2022 3:24:10 PM By: Geralyn Corwin DO Entered By: Geralyn Corwin on 03/27/2022 15:22:55 -------------------------------------------------------------------------------- Problem List Details Patient Name: Date of Service: Kelly Milling RIA Cherly Anderson. 03/27/2022 2:15 PM Medical Record Number: [id number] Patient Account Number: [id number] Date of Birth/Sex: Treating RN: 29-Jul-1996 (26 y.o. Kelly [name] Primary Care Provider: Dione Booze Other Clinician: Referring Provider: Treating Provider/Extender:  Gennaro Africa in Treatment: 58 Active Problems ICD-10 Encounter Code Description Active Date MDM Diagnosis [phone] Non-pressure chronic ulcer of other part of left lower  leg with fat layer exposed11/10/2020 No Yes L97.812 Non-pressure chronic ulcer of other part of right lower leg with fat layer 06/29/2021 No Yes exposed M32.9 Systemic lupus erythematosus, unspecified 02/14/2021 No Yes Inactive Problems ICD-10 Code Description Active Date Inactive Date L97.819 Non-pressure chronic ulcer of other part of right lower leg with unspecified severity 02/13/2021 02/13/2021 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 with unspecified severity 02/13/2021 02/13/2021 Z61.096E Unspecified open wound, left lower leg, initial encounter 04/21/2021 04/21/2021 S91.002A Unspecified open wound, left ankle, initial encounter 04/21/2021 04/21/2021 Resolved Problems ICD-10 Code Description Active Date Resolved Date L03.116 Cellulitis of left lower limb 08/24/2021 08/24/2021 Electronic Signature(s) Signed: 03/27/2022 3:24:10 PM By: Geralyn Corwin DO Entered By: Geralyn Corwin on 03/27/2022 15:21:29 -------------------------------------------------------------------------------- Progress Note Details Patient Name: Date of Service: Kelly Milling RIA Cherly Anderson. 03/27/2022 2:15 PM Medical Record Number: [id number] Patient Account Number: [id number] Date of Birth/Sex: Treating RN: Jul 07, 1996 (26 y.o. Kelly [name] Primary Care Provider: Dione Booze Other Clinician: Referring Provider: Treating Provider/Extender: Gennaro Africa in Treatment: 58 Subjective Chief Complaint Information obtained from Patient Bilateral lower extremity wounds 04/21/21: left lower extremity wound s/p cellulitis 09/01/21; right lower extremity wound History of Present Illness (HPI) Admission 6/21 Ms. Astrid Vides is A 26 year old female with a past medical  history of systemic lupus erythematosus that presents with bilateral lower extremity wounds that started in April 2022. She has had skin issues in the past where she developed very small wounds but these healed with time. She has never had open wounds like she does on her legs. She currently reports minimal drainage to the wounds. She does have pain to these areas but has been overall stable. She denies signs of infection. She has visited the ED for this issue and was recently prescribed doxycycline for possible cellulitis. She follows with [facility] for her SLE and is currently on Plaquenil, prednisone and CellCept. 6/27; patient presents for 1 week follow-up. She states she saw her rheumatologist on 6/22 and prednisone was increased from 10 mg to 20 mg daily. She is currently at the highest dose of Plaquenil and CellCept and is going to try an infusion later today at her regimen. She reports improvement to the wounds. She has been using collagen with dressing changes. She currently denies signs of infection. Readmission 8/26 Patient presents to clinic today for open wounds to her left lower extremity. Her previous wounds that were treated have closed. She states that on 8/9 she developed cellulitis and was treated with 2 rounds of antibiotics. Her cellulitis has resolved. She had blisters that opened and are now large wounds. She has seen her dermatologist and rheumatologist since she has systemic lupus and a hard time healing. No changes have been made to her medications. She was recommended antibiotic ointment by her dermatologist to her wounds She picked up the antibiotic ointment today.. She reports pain to these areas. She denies purulent drainage, increased warmth or erythema to the left lower extremity. She currently keeps the areas covered. Of note she had an Achilles tendon rupture on her right leg and is currently in a soft cast. 9/13; patient presents for follow-up. She missed her  last clinic appointment. She has been using Santyl to the wound beds daily. She has no issues or complaints today. She reports the Santyl is helping clean up the wounds. She currently denies signs of infection. 9/22; patient states that 1 week ago she was seen by dermatology and they placed  an Radio broadcast assistant with Bactroban on her left lower extremity. Today she presents with a Unna boot in place. She currently denies signs of infection. 10/7; patient presents for follow-up. She reports improvement. She has been using Santyl to the areas of nonviable tissue and collagen to granulation tissue. She has been approved for infusions for her lupus. She is going to start these soon. These will be injection she can do at home. She currently denies signs of infection. 10/21; patient presents for follow-up. She reports continued improvement to her wound healing. She has been using Santyl to all areas except for the superior posterior left leg wound and she is using collagen to this. She has started her injection therapy for lupus. She started 1 week ago. She currently denies signs of infection. 11/3; patient presents for follow-up. She has no issues or complaints today. She is on her third week of injection therapy for lupus and tolerating this well. She has no issues or complaints today. She denies signs of infection. 11/17; patient presents for 2-week follow-up. She reports improvement in wound healing. She denies signs of infection. 12/1; patient presents for follow-up. She has been using collagen to the anterior left lower extremity wounds and Santyl to the left lateral malleolus. She has no issues or complaints today. 12/15; patient presents for follow-up. She is using collagen to the 2 anterior left leg wounds and Santyl to the left lateral malleolus wound. She reports improvement in wound healing. She denies signs of infection. 12/22; patient presents because she is having purulent drainage from her anterior  left leg wound. She reports no inciting event and states that the area started draining spontaneously. She was hoping that the drainage would stop however this has not improved. She states this started 5 days ago. She reports tenderness to the wound bed. 12/29; patient presents for follow-up. She was able to take Augmentin for 4 days but did not tolerate the GI side effects. She continues to take doxycycline. She reports improvement in drainage. She no longer has increased warmth to the surrounding wound bed. She ran out of [place] and has been using Vaseline to the wound beds. 1/6; Patient presents for follow-up. She has been taking Keflex without issues. She reports improvement in drainage and wound healing T the left lower o extremity. Unfortunately she developed another wound spontaneously to the right anterior lower extremity. She denies systemic signs of infection. 1/16; patient presents for follow-up. Patient completed her first prescription of Keflex and has not started the second. She reports no more drainage to the wound beds. She states there was an issue with insurance however she is going to be able to obtain Santyl soon. She has been using Dakin's wet-to-dry dressings to the right anterior leg and left lateral ankle. She has been using collagen to the distal anterior left leg wound and reports that the proximal anterior left leg wound has healed. She denies systemic signs of infection. 1/31; patient presents for follow-up. She reports improvement to the left leg wounds. She reports increased inflammation to the right leg wound. She thinks she is having a reaction to the foam border dressing on the right leg. She is unable to obtain Santyl. She has been using Dakin's wet-to-dry dressings to the right anterior leg and left lateral ankle. She has been using collagen to the distal anterior left leg wound. 2/13; patient presents for follow-up. She reports improvement in her wound healing. One  of the wounds to the anterior aspect of the  left lower leg has slightly reopened. She denies signs of infection. 2/27; patient presents for follow-up. She has no issues or complaints today. She denies signs of infection. 3/20; patient presents for follow-up. She has been using Medihoney to the right lower extremity wound and left lateral ankle wound. She continues to use collagen to the left anterior leg wound. She has no issues or complaints today. She denies signs of infection. She reports improvement in wound 4/10; patient has been using Medihoney to the right anterior lower leg wound and silver collagen on the left including the left ankle and left anterior. Fortunately the left anterior lower leg wound is healed. Both of the areas on the left ankle and the right anterior lower leg appear clean. These wounds are in the setting of chronic lupus. She has had multitude of chronic wounds and a rash on her arms. She tells me she has had both her arms and legs biopsied by dermatology at [facility] although she is not had an appointment this year. She does not have a history of blood clots either arterial or venous. 4/24; patient presents for follow-up. She has been using collagen to the open wound beds. She has developed new areas to her right lower leg. She currently denies signs of infection. 5/9; patient presents for follow-up. She states she is seeing her rheumatologist next week. She has been using Medihoney to all the wound beds except for the lateral left malleolus. She has been using collegen here. She reports improvement to some areas and other areas are flaring due to her lupus. She currently denies signs of infection. 5/23; Patient saw her rheumatologist last week. She has no active lupus activity at this time. Other options for treatment were given. She is going to continue with her current therapy of weekly injections. She reports improvement to Her  wound healing on the left ankle. She has been putting Medihoney here. On the right lower extremity she has had a flareup and has 2 open ulcers. She currently denies signs of infection including increased warmth, erythema or purulent drainage. She has been using Medihoney here as well. 7/10; patient presents for follow-up. She was evaluated in the ED on 6/21 and prescribed Doxycycline For worsening pain to her right lower extremity with increased erythema. She subsequently developed a systemic reaction from the antibiotic and had A diffuse rash. There was concern that her lupus is not well controlled. It was recommended that she start Belimumab. She states she is starting the infusions next week. She has using silver alginate to the wound beds. She currently denies systemic signs of infection. 7/24; patient presents for follow-up. She started her infusion on 7/18 and tolerated this well. She has been using Medihoney to the wound beds. The anterior right leg wound has increased in size. She is currently on Keflex prescribed by her primary care physician. She denies signs of systemic infection. 8/1; patient presents for follow-up. She has been using Medihoney and Dakin's wet-to-dry dressings to the wound beds. She has no issues or complaints today. She denies signs of infection. The posterior right leg wound has healed. Patient History Information obtained from Patient. Family History Diabetes - Paternal Grandparents, Hypertension - Paternal Grandparents, Thyroid Problems - Maternal Grandparents,Paternal Grandparents, No family history of Cancer, Heart Disease, Hereditary Spherocytosis, Kidney Disease, Lung Disease, Seizures, Stroke, Tuberculosis. Social History Never smoker, Marital Status - Married, Alcohol Use - Moderate, Drug Use - Current History - Marijuana, Caffeine Use -  Daily - Coffee. Medical History Respiratory Patient has history of Asthma Immunological Patient has history of Lupus  Erythematosus Objective Constitutional respirations regular, non-labored and within target range for patient.. Vitals Time Taken: 2:40 PM, Height: 62 in, Temperature: 98.4 F, Pulse: 109 bpm, Respiratory Rate: 18 breaths/min, Blood Pressure: 125/83 mmHg. Cardiovascular 2+ dorsalis pedis/posterior tibialis pulses. Psychiatric pleasant and cooperative. General Notes: Right lower extremity: 3 open wounds with granulation tissue and scant nonviable tissue. No surrounding soft tissue infection. Integumentary (Hair, Skin) Wound #10 status is Open. Original cause of wound was Gradually Appeared. The date acquired was: 12/18/2021. The wound has been in treatment 14 weeks. The wound is located on the Right,Medial Lower Leg. The wound measures 5.5cm length x 4.5cm width x 1.5cm depth; 19.439cm^2 area and 29.158cm^3 volume. There is tendon and Fat Layer (Subcutaneous Tissue) exposed. There is no tunneling or undermining noted. There is a medium amount of serosanguineous drainage noted. The wound margin is well defined and not attached to the wound base. There is large (67-100%) red, pink granulation within the wound bed. There is a small (1-33%) amount of necrotic tissue within the wound bed including Adherent Slough. Wound #11 status is Open. Original cause of wound was Gradually Appeared. The date acquired was: 12/25/2021. The wound has been in treatment 12 weeks. The wound is located on the Right,Posterior Lower Leg. The wound measures 0cm length x 0cm width x 0cm depth; 0cm^2 area and 0cm^3 volume. There is no tunneling or undermining noted. There is a medium amount of serosanguineous drainage noted. The wound margin is distinct with the outline attached to the wound base. There is no granulation within the wound bed. There is no necrotic tissue within the wound bed. Wound #12 status is Open. Original cause of wound was Gradually Appeared. The date acquired was: 03/19/2022. The wound has been in treatment  1 weeks. The wound is located on the Right,Anterior Lower Leg. The wound measures 0.8cm length x 0.7cm width x 0.2cm depth; 0.44cm^2 area and 0.088cm^3 volume. There is Fat Layer (Subcutaneous Tissue) exposed. There is no tunneling or undermining noted. There is a medium amount of serosanguineous drainage noted. There is small (1-33%) red granulation within the wound bed. There is a large (67-100%) amount of necrotic tissue within the wound bed including Adherent Slough. Assessment Active Problems ICD-10 Non-pressure chronic ulcer of other part of left lower leg with fat layer exposed Non-pressure chronic ulcer of other part of right lower leg with fat layer exposed Systemic lupus erythematosus, unspecified Patient's wounds have shown improvement in size) since last clinic visit. The posterior wound is healed. I recommended continuing Dakin's to the larger wounds and Medihoney to the smaller wounds. Follow-up in 2 weeks. Plan Follow-up Appointments: Return Appointment in 2 weeks. - Dr Mikey Bussing - [name] 315pm 04/10/2022 Tuesday Other: - taking two Lovenox 60mg  injections daily Bathing/ Shower/ Hygiene: May shower and wash wound with soap and water. Edema Control - Lymphedema / SCD / Other: Elevate legs to the level of the heart or above for 30 minutes daily and/or when sitting, a frequency of: - 3-4 times a day throughout the day. Avoid standing for long periods of time. Additional Orders / Instructions: Follow Nutritious Diet WOUND #10: - Lower Leg Wound Laterality: Right, Medial Cleanser: Soap and Water 2 x Per Day/15 Days Discharge Instructions: May shower and wash wound with dial antibacterial soap and water prior to dressing change. Cleanser: Wound Cleanser (Generic) 2 x Per Day/15 Days Discharge Instructions: Cleanse the  wound with wound cleanser prior to applying a clean dressing using gauze sponges, not tissue or cotton balls. Peri-Wound Care: Skin Prep (Generic) 2 x Per Day/15  Days Discharge Instructions: Use skin prep as directed Prim Dressing: Dakin's Solution 0.25%, 16 (oz) 2 x Per Day/15 Days ary Discharge Instructions: Moisten gauze with Dakin's solution Secondary Dressing: Woven Gauze Sponge, Non-Sterile 4x4 in (Generic) 2 x Per Day/15 Days Discharge Instructions: Apply over primary dressing as directed. Secondary Dressing: Zetuvit Plus Silicone Border Dressing 7x7(in/in) (Generic) 2 x Per Day/15 Days Discharge Instructions: Apply silicone border over primary dressing as directed. WOUND #12: - Lower Leg Wound Laterality: Right, Anterior Cleanser: Soap and Water 2 x Per Day/15 Days Discharge Instructions: May shower and wash wound with dial antibacterial soap and water prior to dressing change. Cleanser: Wound Cleanser (Generic) 2 x Per Day/15 Days Discharge Instructions: Cleanse the wound with wound cleanser prior to applying a clean dressing using gauze sponges, not tissue or cotton balls. Peri-Wound Care: Skin Prep (Generic) 2 x Per Day/15 Days Discharge Instructions: Use skin prep as directed Prim Dressing: MediHoney Calcium Alginate Dressing 4x5 in 2 x Per Day/15 Days ary Discharge Instructions: Apply to wound bed as instructed Secondary Dressing: ALLEVYN Gentle Border, 3x3 (in/in) (Generic) 2 x Per Day/15 Days Discharge Instructions: Apply over primary dressing as directed. Secondary Dressing: Woven Gauze Sponge, Non-Sterile 4x4 in (Generic) 2 x Per Day/15 Days Discharge Instructions: Apply over primary dressing as directed. 1. Dakin's wet-to-dry dressings 2. Medihoney 3. Follow-up in 2 weeks Electronic Signature(s) Signed: 03/27/2022 3:24:10 PM By: Geralyn Corwin DO Entered By: Geralyn Corwin on 03/27/2022 15:23:37 -------------------------------------------------------------------------------- HxROS Details Patient Name: Date of Service: Kelly Milling [name] 03/27/2022 2:15 PM Medical Record Number: [id number] Patient Account Number:  [id number] Date of Birth/Sex: Treating RN: [birth year] (26 y.o. Kelly [name], Kelly [name] Primary Care Provider: Dione Booze Other Clinician: Referring Provider: Treating Provider/Extender: Gennaro Africa in Treatment: 58 Information Obtained From Patient Respiratory Medical History: Positive for: Asthma Immunological Medical History: Positive for: Lupus Erythematosus Immunizations Pneumococcal Vaccine: Received Pneumococcal Vaccination: No Implantable Devices None Family and Social History Cancer: No; Diabetes: Yes - Paternal Grandparents; Heart Disease: No; Hereditary Spherocytosis: No; Hypertension: Yes - Paternal Grandparents; Kidney Disease: No; Lung Disease: No; Seizures: No; Stroke: No; Thyroid Problems: Yes - Maternal Grandparents,Paternal Grandparents; Tuberculosis: No; Never smoker; Marital Status - Married; Alcohol Use: Moderate; Drug Use: Current History - Marijuana; Caffeine Use: Daily - Coffee; Financial Concerns: No; Food, Clothing or Shelter Needs: No; Support System Lacking: No Electronic Signature(s) Signed: 03/27/2022 3:24:10 PM By: Geralyn Corwin DO Signed: 03/27/2022 4:41:40 PM By: Shawn Stall RN, BSN Entered By: Geralyn Corwin on 03/27/2022 15:22:18 -------------------------------------------------------------------------------- SuperBill Details Patient Name: Date of Service: Kelly Milling [name] 03/27/2022 Medical Record Number: [id number] Patient Account Number: [id number] Date of Birth/Sex: Treating RN: 1996/02/18 (26 y.o. Kelly [name] Primary Care Provider: Dione Booze Other Clinician: Referring Provider: Treating Provider/Extender: Gennaro Africa in Treatment: 58 Diagnosis Coding ICD-10 Codes Code Description [phone] Non-pressure chronic ulcer of other part of left lower leg with fat layer exposed L97.812 Non-pressure chronic ulcer of other part of right lower leg with fat layer  exposed M32.9 Systemic lupus erythematosus, unspecified Facility Procedures CPT4 Code: [id number] Description: 99214 - WOUND CARE VISIT-LEV 4 EST PT Modifier: Quantity: 1 Physician Procedures : CPT4 Code Description Modifier [id number] - WC PHYS LEVEL 3 - EST PT ICD-10 Diagnosis Description L97.822 Non-pressure chronic ulcer of other part of left  lower leg with fat layer exposed L97.812 Non-pressure chronic ulcer of other part of right  lower leg with fat layer exposed M32.9 Systemic lupus erythematosus, unspecified Quantity: 1 Electronic Signature(s) Signed: 03/27/2022 3:24:10 PM By: Geralyn Corwin DO Entered By: Geralyn Corwin on 03/27/2022 15:23:47

## 2022-03-28 LAB — URINE CULTURE: Organism ID, Bacteria: NO GROWTH

## 2022-04-02 NOTE — Progress Notes (Signed)
Kelly [name], Kelly [name] ([id number]) Visit Report for 03/19/2022 Arrival Information Details Patient Name: Date of Service: Kelly [name], Kelly [name] 03/19/2022 3:15 PM Medical Record Number: [id number] Patient Account Number: [id number] Date of Birth/Sex: Treating RN: 12/05/1995 (26 y.o. Kelly [name] Primary Care Kelly [name]: Kelly [name] Other Clinician: Referring Kelly [name]: Treating Erendida Wrenn/Extender: Kelly [name] in Treatment: 17 Visit Information History Since Last Visit Added or deleted any medications: No Patient Arrived: Ambulatory Any new allergies or adverse reactions: No Arrival Time: 15:31 Had a fall or experienced change in No Accompanied By: self activities of daily living that may affect Transfer Assistance: None risk of falls: Patient Identification Verified: Yes Signs or symptoms of abuse/neglect since last visito No Secondary Verification Process Completed: Yes Hospitalized since last visit: No Patient Requires Transmission-Based Precautions: No Implantable device outside of the clinic excluding No Patient Has Alerts: Yes cellular tissue based products placed in the center Patient Alerts: Left ABi=1.18 since last visit: Has Dressing in Place as Prescribed: Yes Pain Present Now: Yes Electronic Signature(s) Signed: 03/19/2022 4:24:29 PM By: Kelly Creamer RN, BSN Entered By: Kelly [name] on 03/19/2022 15:31:43 -------------------------------------------------------------------------------- Clinic Level of Care Assessment Details Patient Name: Date of Service: Kelly [name], Kelly [name]. 03/19/2022 3:15 PM Medical Record Number: [id number] Patient Account Number: [id number] Date of Birth/Sex: Treating RN: Dec 05, 1995 (26 y.o. Kelly [name] Primary Care Nehan Flaum: Kelly [name] Other Clinician: Referring Kelly [name]: Treating Seaira Byus/Extender: Kelly [name] in Treatment: 57 Clinic Level of Care Assessment  Items TOOL 4 Quantity Score X- 1 0 Use when only an EandM is performed on FOLLOW-UP visit ASSESSMENTS - Nursing Assessment / Reassessment X- 1 10 Reassessment of Co-morbidities (includes updates in patient status) X- 1 5 Reassessment of Adherence to Treatment Plan ASSESSMENTS - Wound and Skin A ssessment / Reassessment [] - 0 Simple Wound Assessment / Reassessment - one wound X- 3 5 Complex Wound Assessment / Reassessment - multiple wounds [] - 0 Dermatologic / Skin Assessment (not related to wound area) ASSESSMENTS - Focused Assessment [] - 0 Circumferential Edema Measurements - multi extremities [] - 0 Nutritional Assessment / Counseling / Intervention [] - 0 Lower Extremity Assessment (monofilament, tuning fork, pulses) [] - 0 Peripheral Arterial Disease Assessment (using hand held doppler) ASSESSMENTS - Ostomy and/or Continence Assessment and Care [] - 0 Incontinence Assessment and Management [] - 0 Ostomy Care Assessment and Management (repouching, etc.) PROCESS - Coordination of Care [] - 0 Simple Patient / Family Education for ongoing care X- 1 20 Complex (extensive) Patient / Family Education for ongoing care X- 1 10 Staff obtains Programmer, systems, Records, T Results / Process Orders est [] - 0 Staff telephones HHA, Nursing Homes / Clarify orders / etc [] - 0 Routine Transfer to another Facility (non-emergent condition) [] - 0 Routine Hospital Admission (non-emergent condition) [] - 0 New Admissions / Biomedical engineer / Ordering NPWT Apligraf, etc. , [] - 0 Emergency Hospital Admission (emergent condition) [] - 0 Simple Discharge Coordination [] - 0 Complex (extensive) Discharge Coordination PROCESS - Special Needs [] - 0 Pediatric / Minor Patient Management [] - 0 Isolation Patient Management [] - 0 Hearing / Language / Visual special needs [] - 0 Assessment of Community assistance (transportation, D/C planning, etc.) [] - 0 Additional  assistance / Altered mentation [] - 0 Support Surface(s) Assessment (bed, cushion, seat, etc.) INTERVENTIONS - Wound Cleansing / Measurement [] - 0 Simple Wound Cleansing - one wound X- 3 5 Complex Wound Cleansing -  multiple wounds X- 1 5 Wound Imaging (photographs - any number of wounds) [] - 0 Wound Tracing (instead of photographs) [] - 0 Simple Wound Measurement - one wound X- 3 5 Complex Wound Measurement - multiple wounds INTERVENTIONS - Wound Dressings [] - 0 Small Wound Dressing one or multiple wounds X- 1 15 Medium Wound Dressing one or multiple wounds [] - 0 Large Wound Dressing one or multiple wounds X- 1 5 Application of Medications - topical [] - 0 Application of Medications - injection INTERVENTIONS - Miscellaneous [] - 0 External ear exam [] - 0 Specimen Collection (cultures, biopsies, blood, body fluids, etc.) [] - 0 Specimen(s) / Culture(s) sent or taken to Lab for analysis [] - 0 Patient Transfer (multiple staff / Civil Service fast streamer / Similar devices) [] - 0 Simple Staple / Suture removal (25 or less) [] - 0 Complex Staple / Suture removal (26 or more) [] - 0 Hypo / Hyperglycemic Management (close monitor of Blood Glucose) [] - 0 Ankle / Brachial Index (ABI) - do not check if billed separately X- 1 5 Vital Signs Has the patient been seen at the hospital within the last three years: Yes Total Score: 120 Level Of Care: New/Established - Level 4 Electronic Signature(s) Signed: 03/19/2022 4:24:29 PM By: Kelly Creamer RN, BSN Entered By: Kelly [name] on 03/19/2022 16:07:22 -------------------------------------------------------------------------------- Encounter Discharge Information Details Patient Name: Date of Service: Kelly Lou [name] 03/19/2022 3:15 PM Medical Record Number: [id number] Patient Account Number: [id number] Date of Birth/Sex: Treating RN: 1996-03-12 (26 y.o. Kelly [name] Primary Care : Kelly [name] Other  Clinician: Referring : Treating /Extender: Kelly [name] in Treatment: 64 Encounter Discharge Information Items Discharge Condition: Stable Ambulatory Status: Ambulatory Discharge Destination: Home Transportation: Private Auto Accompanied By: self Schedule Follow-up Appointment: Yes Clinical Summary of Care: Patient Declined Electronic Signature(s) Signed: 03/19/2022 4:24:29 PM By: Kelly Creamer RN, BSN Entered By: Kelly [name] on 03/19/2022 16:08:13 -------------------------------------------------------------------------------- Lower Extremity Assessment Details Patient Name: Date of Service: Kelly Lou [name] 03/19/2022 3:15 PM Medical Record Number: [id number] Patient Account Number: [id number] Date of Birth/Sex: Treating RN: 1996/05/03 (26 y.o. Kelly [name] Primary Care : Kelly [name] Other Clinician: Referring : Treating /Extender: Kelly [name] in Treatment: 57 Edema Assessment Assessed: Shirlyn Goltz: No] [Right: No] Edema: [Left: Yes] [Right: Yes] Calf Left: Right: Point of Measurement: 27 cm From Medial Instep 32 cm Ankle Left: Right: Point of Measurement: 8 cm From Medial Instep 23 cm Vascular Assessment Pulses: Dorsalis Pedis Palpable: [Right:Yes] Electronic Signature(s) Signed: 03/19/2022 4:24:29 PM By: Kelly Creamer RN, BSN Entered By: Kelly [name] on 03/19/2022 15:30:48 -------------------------------------------------------------------------------- Multi Wound Chart Details Patient Name: Date of Service: Kelly Lou [name] 03/19/2022 3:15 PM Medical Record Number: [id number] Patient Account Number: [id number] Date of Birth/Sex: Treating RN: 16-May-1996 (26 y.o. [name], [name] Primary Care : Kelly [name] Other Clinician: Referring : Treating /Extender: Kelly [name] in Treatment:  57 Vital Signs Height(in): 62 Pulse(bpm): 97 Weight(lbs): Blood Pressure(mmHg): 152/104 Body Mass Index(BMI): Temperature(F): 98.5 Respiratory Rate(breaths/min): 18 Photos: Right, Medial Lower Leg Right, Posterior Lower Leg Right, Anterior Lower Leg Wound Location: Gradually Appeared Gradually Appeared Gradually Appeared Wounding Event: Lupus Lupus Auto-immune Primary Etiology: Asthma, Lupus Erythematosus Asthma, Lupus Erythematosus Asthma, Lupus Erythematosus Comorbid History: 12/18/2021 12/25/2021 03/19/2022 Date Acquired: 13 10 0 Weeks of Treatment: Open Open Open Wound Status: No No No Wound Recurrence: Yes No No Clustered Wound: 2 N/A N/A Clustered Quantity: 5.5x3.8x0.7  0.3x0.3x0.2 0.9x0.8x0.2 Measurements L x W x D (cm) 16.415 0.071 0.565 A (cm) : rea 11.49 0.014 0.113 Volume (cm) : -32.70% 0.00% 0.00% % Reduction in A rea: -209.60% 33.30% 0.00% % Reduction in Volume: 7 Starting Position 1 (o'clock): 1 Ending Position 1 (o'clock): 2.2 Maximum Distance 1 (cm): Yes No No Undermining: Full Thickness With Exposed Support Full Thickness With Exposed Support Full Thickness Without Exposed Classification: Structures Structures Support Structures Medium Medium Medium Exudate Amount: Serosanguineous Serosanguineous Serosanguineous Exudate Type: red, brown red, brown red, brown Exudate Color: Well defined, not attached Distinct, outline attached N/A Wound Margin: Large (67-100%) Large (67-100%) Small (1-33%) Granulation Amount: Red, Pink Red, Pink Red Granulation Quality: Small (1-33%) Small (1-33%) Large (67-100%) Necrotic Amount: Fat Layer (Subcutaneous Tissue): Yes Fat Layer (Subcutaneous Tissue): Yes Fat Layer (Subcutaneous Tissue): Yes Exposed Structures: Tendon: Yes Fascia: No Fascia: No Tendon: No Muscle: No Muscle: No Joint: No Joint: No Bone: No Bone: No None None None Epithelialization: Treatment Notes Electronic  Signature(s) Signed: 03/19/2022 4:09:14 PM By: Kalman Shan DO Signed: 04/02/2022 4:45:01 PM By: Rhae Hammock RN Entered By: Kalman Shan on 03/19/2022 16:02:00 -------------------------------------------------------------------------------- Multi-Disciplinary Care Plan Details Patient Name: Date of Service: Kelly Lou [name] 03/19/2022 3:15 PM Medical Record Number: [id number] Patient Account Number: [id number] Date of Birth/Sex: Treating RN: 08-11-1996 (26 y.o. Kelly [name] Primary Care : Kelly [name] Other Clinician: Referring : Treating /Extender: Kelly [name] in Treatment: 50 Multidisciplinary Care Plan reviewed with physician Active Inactive Wound/Skin Impairment Nursing Diagnoses: Impaired tissue integrity Goals: Patient/caregiver will verbalize understanding of skin care regimen Date Initiated: 02/13/2021 Target Resolution Date: 03/23/2022 Goal Status: Active Ulcer/skin breakdown will have a volume reduction of 30% by week 4 Date Initiated: 02/13/2021 Date Inactivated: 04/21/2021 Target Resolution Date: 03/13/2021 Unmet Reason: pt did not return to Goal Status: Unmet clinic for F/U Ulcer/skin breakdown will have a volume reduction of 80% by week 12 Date Initiated: 04/21/2021 Date Inactivated: 07/13/2021 Target Resolution Date: 07/27/2021 Goal Status: Met Interventions: Assess patient/caregiver ability to obtain necessary supplies Assess patient/caregiver ability to perform ulcer/skin care regimen upon admission and as needed Assess ulceration(s) every visit Provide education on ulcer and skin care Treatment Activities: Skin care regimen initiated : 02/13/2021 Topical wound management initiated : 02/13/2021 Notes: 06/29/21: Wounds 3 and 6 only at 80% volume reduction. Target date extended. Electronic Signature(s) Signed: 03/19/2022 4:24:29 PM By: Kelly Creamer RN, BSN Entered By: Kelly [name]  on 03/19/2022 15:38:13 -------------------------------------------------------------------------------- Pain Assessment Details Patient Name: Date of Service: Kelly Lou RIA Cipriano Bunker. 03/19/2022 3:15 PM Medical Record Number: [id number] Patient Account Number: [id number] Date of Birth/Sex: Treating RN: 02-07-1996 (26 y.o. Kelly [name] Primary Care : Kelly [name] Other Clinician: Referring : Treating /Extender: Kelly [name] in Treatment: 22 Active Problems Location of Pain Severity and Description of Pain Patient Has Paino Yes Site Locations Rate the pain. Current Pain Level: 6 Character of Pain Describe the Pain: Burning Pain Management and Medication Current Pain Management: Electronic Signature(s) Signed: 03/19/2022 4:24:29 PM By: Kelly Creamer RN, BSN Entered By: Kelly [name] on 03/19/2022 15:37:48 -------------------------------------------------------------------------------- Patient/Caregiver Education Details Patient Name: Date of Service: Kelly [name] 7/24/2023andnbsp3:15 PM Medical Record Number: [id number] Patient Account Number: [id number] Date of Birth/Gender: Treating RN: 1995/12/23 (26 y.o. Kelly [name] Primary Care Physician: Kelly [name] Other Clinician: Referring Physician: Treating Physician/Extender: Kelly [name] in Treatment: 7 Education Assessment Education Provided To: Patient Education Topics Provided Wound/Skin Impairment: Methods:  Explain/Verbal Responses: State content correctly Electronic Signature(s) Signed: 03/19/2022 4:24:29 PM By: Kelly Creamer RN, BSN Entered By: Kelly [name] on 03/19/2022 15:38:28 -------------------------------------------------------------------------------- Wound Assessment Details Patient Name: Date of Service: Kelly Lou RIA Cipriano Bunker. 03/19/2022 3:15 PM Medical Record Number: [id number] Patient Account  Number: [id number] Date of Birth/Sex: Treating RN: 08-06-1996 (26 y.o. Kelly [name], [name] Primary Care Annaleah Arata: Kelly [name] Other Clinician: Referring Alexiah Koroma: Treating Elbert Spickler/Extender: Kelly [name] in Treatment: 57 Wound Status Wound Number: 10 Primary Etiology: Lupus Wound Location: Right, Medial Lower Leg Wound Status: Open Wounding Event: Gradually Appeared Comorbid History: Asthma, Lupus Erythematosus Date Acquired: 12/18/2021 Weeks Of Treatment: 13 Clustered Wound: Yes Photos Wound Measurements Length: (cm) 5.5 Width: (cm) 3.8 Depth: (cm) 0.7 Clustered Quantity: 2 Area: (cm) 16.415 Volume: (cm) 11.49 % Reduction in Area: -32.7% % Reduction in Volume: -209.6% Epithelialization: None Tunneling: No Undermining: Yes Starting Position (o'clock): 7 Ending Position (o'clock): 1 Maximum Distance: (cm) 2.2 Wound Description Classification: Full Thickness With Exposed Support Structures Wound Margin: Well defined, not attached Exudate Amount: Medium Exudate Type: Serosanguineous Exudate Color: red, brown Foul Odor After Cleansing: No Slough/Fibrino Yes Wound Bed Granulation Amount: Large (67-100%) Exposed Structure Granulation Quality: Red, Pink Fascia Exposed: No Necrotic Amount: Small (1-33%) Fat Layer (Subcutaneous Tissue) Exposed: Yes Necrotic Quality: Adherent Slough Tendon Exposed: Yes Muscle Exposed: No Joint Exposed: No Bone Exposed: No Electronic Signature(s) Signed: 03/19/2022 4:56:39 PM By: Deon Pilling RN, BSN Entered By: Deon Pilling on 03/19/2022 15:36:58 -------------------------------------------------------------------------------- Wound Assessment Details Patient Name: Date of Service: Kelly Lou RIA Cipriano Bunker. 03/19/2022 3:15 PM Medical Record Number: [id number] Patient Account Number: [id number] Date of Birth/Sex: Treating RN: Jan 06, 1996 (26 y.o. Kelly [name] Primary Care Claudine Stallings: Kelly [name] Other Clinician: Referring Leilah Polimeni: Treating [employer]/Extender: Kelly [name] in Treatment: 57 Wound Status Wound Number: 11 Primary Etiology: Lupus Wound Location: Right, Posterior Lower Leg Wound Status: Open Wounding Event: Gradually Appeared Comorbid History: Asthma, Lupus Erythematosus Date Acquired: 12/25/2021 Weeks Of Treatment: 10 Clustered Wound: No Photos Wound Measurements Length: (cm) 0.3 Width: (cm) 0.3 Depth: (cm) 0.2 Area: (cm) 0.071 Volume: (cm) 0.014 % Reduction in Area: 0% % Reduction in Volume: 33.3% Epithelialization: None Tunneling: No Undermining: No Wound Description Classification: Full Thickness With Exposed Support Structures Wound Margin: Distinct, outline attached Exudate Amount: Medium Exudate Type: Serosanguineous Exudate Color: red, brown Foul Odor After Cleansing: No Slough/Fibrino Yes Wound Bed Granulation Amount: Large (67-100%) Exposed Structure Granulation Quality: Red, Pink Fascia Exposed: No Necrotic Amount: Small (1-33%) Fat Layer (Subcutaneous Tissue) Exposed: Yes Necrotic Quality: Adherent Slough Tendon Exposed: No Muscle Exposed: No Joint Exposed: No Bone Exposed: No Electronic Signature(s) Signed: 03/19/2022 4:24:29 PM By: Kelly Creamer RN, BSN Signed: 03/19/2022 4:56:39 PM By: Deon Pilling RN, BSN Entered By: Deon Pilling on 03/19/2022 15:37:45 -------------------------------------------------------------------------------- Wound Assessment Details Patient Name: Date of Service: Kelly Lou RIA Cipriano Bunker. 03/19/2022 3:15 PM Medical Record Number: [id number] Patient Account Number: [id number] Date of Birth/Sex: Treating RN: 06/13/1996 (26 y.o. Kelly [name] Primary Care Zamar Odwyer: Kelly [name] Other Clinician: Referring Shaia Porath: Treating Fintan Grater/Extender: Kelly [name] in Treatment: 57 Wound Status Wound Number: 12 Primary Etiology:  Auto-immune Wound Location: Right, Anterior Lower Leg Wound Status: Open Wounding Event: Gradually Appeared Comorbid History: Asthma, Lupus Erythematosus Date Acquired: 03/19/2022 Weeks Of Treatment: 0 Clustered Wound: No Photos Wound Measurements Length: (cm) 0.9 Width: (cm) 0.8 Depth: (cm) 0.2 Area: (cm) 0.565 Volume: (cm) 0.113 % Reduction in Area: 0% % Reduction in Volume: 0%  Epithelialization: None Tunneling: No Undermining: No Wound Description Classification: Full Thickness Without Exposed Support Structures Exudate Amount: Medium Exudate Type: Serosanguineous Exudate Color: red, brown Foul Odor After Cleansing: No Slough/Fibrino Yes Wound Bed Granulation Amount: Small (1-33%) Exposed Structure Granulation Quality: Red Fat Layer (Subcutaneous Tissue) Exposed: Yes Necrotic Amount: Large (67-100%) Necrotic Quality: Adherent Therapist, music) Signed: 03/19/2022 4:24:29 PM By: Kelly Creamer RN, BSN Signed: 03/19/2022 4:56:39 PM By: Deon Pilling RN, BSN Entered By: Deon Pilling on 03/19/2022 15:37:19 -------------------------------------------------------------------------------- Vitals Details Patient Name: Date of Service: Kelly Lou [name] 03/19/2022 3:15 PM Medical Record Number: [id number] Patient Account Number: [id number] Date of Birth/Sex: Treating RN: [birth year] (26 y.o. Kelly [name] Primary Care : Other Clinician: Lavone [name] Referring : Treating /Extender: Kelly [name] in Treatment: 57 Vital Signs Time Taken: 15:31 Temperature (F): 98.5 Height (in): 62 Pulse (bpm): 97 Respiratory Rate (breaths/min): 18 Blood Pressure (mmHg): 152/104 Reference Range: 80 - 120 mg / dl Electronic Signature(s) Signed: 03/19/2022 4:24:29 PM By: Kelly Creamer RN, BSN Entered By: Kelly [name] on 03/19/2022 15:33:53

## 2022-04-02 NOTE — Progress Notes (Signed)
YENI, RENTON ([id number]) Visit Report for 03/19/2022 Chief Complaint Document Details Patient Name: Date of Service: Kelly [name], Kelly [name] 03/19/2022 3:15 PM Medical Record Number: [id number] Patient Account Number: [id number] Date of Birth/Sex: Treating RN: [birth year] (26 y.o. [name], [name] Primary Care Provider: [name] Other Clinician: Referring Provider: Treating Provider/Extender: Elsworth Soho in [id number] Information Obtained from: Patient Chief Complaint Bilateral lower extremity wounds 04/21/21: left lower extremity wound s/p cellulitis 09/01/21; right lower extremity wound Electronic Signature(s) Signed: 03/19/2022 4:09:14 PM By: Kalman Shan DO Entered By: Kalman Shan on 03/19/2022 16:02:05 -------------------------------------------------------------------------------- HPI Details Patient Name: Date of Service: Kelly Lou [name] 03/19/2022 3:15 PM Medical Record Number: [id number] Patient Account Number: [id number] Date of Birth/Sex: Treating RN: 1995/11/21 (26 y.o. [name], [name] Primary Care Provider: [name] Other Clinician: Referring Provider: Treating Provider/Extender: Elsworth Soho in Treatment: 21 History of Present Illness HPI Description: Admission 6/21 [name] with a past medical history of systemic lupus erythematosus that presents with bilateral lower extremity wounds that started in April 2022. She has had skin issues in the past where she developed very small wounds but these healed with time. She has never had open wounds like she does on her legs. She currently reports minimal drainage to the wounds. She does have pain to these areas but has been overall stable. She denies signs of infection. She has visited the ED for this issue and was recently prescribed doxycycline for possible cellulitis. She follows with  [facility] for her SLE and is currently on Plaquenil, prednisone and CellCept. 6/27; patient presents for 1 week follow-up. She states she saw her rheumatologist on 6/22 and prednisone was increased from 10 mg to 20 mg daily. She is currently at the highest dose of Plaquenil and CellCept and is going to try an infusion later today at her regimen. She reports improvement to the wounds. She has been using collagen with dressing changes. She currently denies signs of infection. Readmission 8/26 Patient presents to clinic today for open wounds to her left lower extremity. Her previous wounds that were treated have closed. She states that on 8/9 she developed cellulitis and was treated with 2 rounds of antibiotics. Her cellulitis has resolved. She had blisters that opened and are now large wounds. She has seen her dermatologist and rheumatologist since she has systemic lupus and a hard time healing. No changes have been made to her medications. She was recommended antibiotic ointment by her dermatologist to her wounds She picked up the antibiotic ointment today.. She reports pain to these areas. She denies purulent drainage, increased warmth or erythema to the left lower extremity. She currently keeps the areas covered. Of note she had an Achilles tendon rupture on her right leg and is currently in a soft cast. 9/13; patient presents for follow-up. She missed her last clinic appointment. She has been using Santyl to the wound beds daily. She has no issues or complaints today. She reports the Santyl is helping clean up the wounds. She currently denies signs of infection. 9/22; patient states that 1 week ago she was seen by dermatology and they placed an Unna boot with Bactroban on her left lower extremity. Today she presents with a Unna boot in place. She currently denies signs of infection. 10/7; patient presents for follow-up. She reports improvement. She has been using Santyl to the areas of  nonviable tissue and collagen to granulation tissue. She  has been approved for infusions for her lupus. She is going to start these soon. These will be injection she can do at home. She currently denies signs of infection. 10/21; patient presents for follow-up. She reports continued improvement to her wound healing. She has been using Santyl to all areas except for the superior posterior left leg wound and she is using collagen to this. She has started her injection therapy for lupus. She started 1 week ago. She currently denies signs of infection. 11/3; patient presents for follow-up. She has no issues or complaints today. She is on her third week of injection therapy for lupus and tolerating this well. She has no issues or complaints today. She denies signs of infection. 11/17; patient presents for 2-week follow-up. She reports improvement in wound healing. She denies signs of infection. 12/1; patient presents for follow-up. She has been using collagen to the anterior left lower extremity wounds and Santyl to the left lateral malleolus. She has no issues or complaints today. 12/15; patient presents for follow-up. She is using collagen to the 2 anterior left leg wounds and Santyl to the left lateral malleolus wound. She reports improvement in wound healing. She denies signs of infection. 12/22; patient presents because she is having purulent drainage from her anterior left leg wound. She reports no inciting event and states that the area started draining spontaneously. She was hoping that the drainage would stop however this has not improved. She states this started 5 days ago. She reports tenderness to the wound bed. 12/29; patient presents for follow-up. She was able to take Augmentin for 4 days but did not tolerate the GI side effects. She continues to take doxycycline. She reports improvement in drainage. She no longer has increased warmth to the surrounding wound bed. She ran out of [place] and  has been using Vaseline to the wound beds. 1/6; Patient presents for follow-up. She has been taking Keflex without issues. She reports improvement in drainage and wound healing T the left lower o extremity. Unfortunately she developed another wound spontaneously to the right anterior lower extremity. She denies systemic signs of infection. 1/16; patient presents for follow-up. Patient completed her first prescription of Keflex and has not started the second. She reports no more drainage to the wound beds. She states there was an issue with insurance however she is going to be able to obtain Santyl soon. She has been using Dakin's wet-to-dry dressings to the right anterior leg and left lateral ankle. She has been using collagen to the distal anterior left leg wound and reports that the proximal anterior left leg wound has healed. She denies systemic signs of infection. 1/31; patient presents for follow-up. She reports improvement to the left leg wounds. She reports increased inflammation to the right leg wound. She thinks she is having a reaction to the foam border dressing on the right leg. She is unable to obtain Santyl. She has been using Dakin's wet-to-dry dressings to the right anterior leg and left lateral ankle. She has been using collagen to the distal anterior left leg wound. 2/13; patient presents for follow-up. She reports improvement in her wound healing. One of the wounds to the anterior aspect of the left lower leg has slightly reopened. She denies signs of infection. 2/27; patient presents for follow-up. She has no issues or complaints today. She denies signs of infection. 3/20; patient presents for follow-up. She has been using Medihoney to the right lower extremity wound and left lateral ankle wound. She continues  to use collagen to the left anterior leg wound. She has no issues or complaints today. She denies signs of infection. She reports improvement in wound 4/10; patient has  been using Medihoney to the right anterior lower leg wound and silver collagen on the left including the left ankle and left anterior. Fortunately the left anterior lower leg wound is healed. Both of the areas on the left ankle and the right anterior lower leg appear clean. These wounds are in the setting of chronic lupus. She has had multitude of chronic wounds and a rash on her arms. She tells me she has had both her arms and legs biopsied by dermatology at [facility] and also follows with rheumatology at [facility] although she is not had an appointment this year. She does not have a history of blood clots either arterial or venous. 4/24; patient presents for follow-up. She has been using collagen to the open wound beds. She has developed new areas to her right lower leg. She currently denies signs of infection. 5/9; patient presents for follow-up. She states she is seeing her rheumatologist next week. She has been using Medihoney to all the wound beds except for the lateral left malleolus. She has been using collegen here. She reports improvement to some areas and other areas are flaring due to her lupus. She currently denies signs of infection. 5/23; Patient saw her rheumatologist last week. She has no active lupus activity at this time. Other options for treatment were given. She is going to continue with her current therapy of weekly injections. She reports improvement to Her wound healing on the left ankle. She has been putting Medihoney here. On the right lower extremity she has had a flareup and has 2 open ulcers. She currently denies signs of infection including increased warmth, erythema or purulent drainage. She has been using Medihoney here as well. 7/10; patient presents for follow-up. She was evaluated in the ED on 6/21 and prescribed Doxycycline For worsening pain to her right lower extremity with increased erythema. She subsequently developed a systemic reaction from the antibiotic and had A  diffuse rash. There was concern that her lupus is not well controlled. It was recommended that she start Belimumab. She states she is starting the infusions next week. She has using silver alginate to the wound beds. She currently denies systemic signs of infection. 7/24; patient presents for follow-up. She started her infusion on 7/18 and tolerated this well. She has been using Medihoney to the wound beds. The anterior right leg wound has increased in size. She is currently on Keflex prescribed by her primary care physician. She denies signs of systemic infection. Electronic Signature(s) Signed: 03/19/2022 4:09:14 PM By: Kalman Shan DO Entered By: Kalman Shan on 03/19/2022 16:03:07 -------------------------------------------------------------------------------- Physical Exam Details Patient Name: Date of Service: Kelly Lou RIA Cipriano Bunker. 03/19/2022 3:15 PM Medical Record Number: [id number] Patient Account Number: [id number] Date of Birth/Sex: Treating RN: 1995/09/21 (26 y.o. [name], [name] Primary Care Provider: [name] Other Clinician: Referring Provider: Treating Provider/Extender: Elsworth Soho in Treatment: 57 Constitutional respirations regular, non-labored and within target range for patient.. Cardiovascular 2+ dorsalis pedis/posterior tibialis pulses. Psychiatric pleasant and cooperative. Notes Right lower extremity: T the medial aspect there are 3 open wounds with granulation tissue and nonviable tissue. No surrounding soft tissue infection. o Electronic Signature(s) Signed: 03/19/2022 4:09:14 PM By: Kalman Shan DO Entered By: Kalman Shan on 03/19/2022 16:06:01 -------------------------------------------------------------------------------- Physician Orders Details Patient Name: Date of Service: Kelly Lou RIA  Cherly Anderson 03/19/2022 3:15 PM Medical Record Number: [id number] Patient Account Number: [id number] Date of  Birth/Sex: Treating RN: [birth year] (26 y.o. Orville Govern Primary Care Provider: Dione Booze Other Clinician: Referring Provider: Treating Provider/Extender: Gennaro Africa in Treatment: 64 Verbal / Phone Orders: No Diagnosis Coding Follow-up Appointments ppointment in 1 week. - Dr Mikey Bussing Leotis Shames Return A Other: - ****Continue taking cephalexin as your PCP prescribed**** ***First infusion July 18,2023*** Bathing/ Shower/ Hygiene May shower and wash wound with soap and water. Edema Control - Lymphedema / SCD / Other Elevate legs to the level of the heart or above for 30 minutes daily and/or when sitting, a frequency of: - 3-4 times a day throughout the day. Avoid standing for long periods of time. Additional Orders / Instructions Follow Nutritious Diet Wound Treatment Wound #10 - Lower Leg Wound Laterality: Right, Medial Cleanser: Soap and Water 2 x Per Day/15 Days Discharge Instructions: May shower and wash wound with dial antibacterial soap and water prior to dressing change. Cleanser: Wound Cleanser (Generic) 2 x Per Day/15 Days Discharge Instructions: Cleanse the wound with wound cleanser prior to applying a clean dressing using gauze sponges, not tissue or cotton balls. Peri-Wound Care: Skin Prep (DME) (Generic) 2 x Per Day/15 Days Discharge Instructions: Use skin prep as directed Prim Dressing: Dakin's Solution 0.25%, 16 (oz) 2 x Per Day/15 Days ary Discharge Instructions: Moisten gauze with Dakin's solution Secondary Dressing: Woven Gauze Sponge, Non-Sterile 4x4 in (DME) (Generic) 2 x Per Day/15 Days Discharge Instructions: Apply over primary dressing as directed. Secondary Dressing: Zetuvit Plus Silicone Border Dressing 7x7(in/in) (DME) (Generic) 2 x Per Day/15 Days Discharge Instructions: Apply silicone border over primary dressing as directed. Wound #11 - Lower Leg Wound Laterality: Right, Posterior Cleanser: Soap and Water 2 x Per  Day/15 Days Discharge Instructions: May shower and wash wound with dial antibacterial soap and water prior to dressing change. Cleanser: Wound Cleanser (Generic) 2 x Per Day/15 Days Discharge Instructions: Cleanse the wound with wound cleanser prior to applying a clean dressing using gauze sponges, not tissue or cotton balls. Peri-Wound Care: Skin Prep (Generic) 2 x Per Day/15 Days Discharge Instructions: Use skin prep as directed Prim Dressing: MediHoney Calcium Alginate Dressing 4x5 in (Generic) 2 x Per Day/15 Days ary Discharge Instructions: Apply to wound bed as instructed Secondary Dressing: Woven Gauze Sponge, Non-Sterile 4x4 in 2 x Per Day/15 Days Discharge Instructions: Apply over primary dressing as directed. Secondary Dressing: Zetuvit Plus Silicone Border Dressing 7x7(in/in) 2 x Per Day/15 Days Discharge Instructions: Apply silicone border over primary dressing as directed. Wound #12 - Lower Leg Wound Laterality: Right, Anterior Cleanser: Soap and Water 2 x Per Day/15 Days Discharge Instructions: May shower and wash wound with dial antibacterial soap and water prior to dressing change. Cleanser: Wound Cleanser (Generic) 2 x Per Day/15 Days Discharge Instructions: Cleanse the wound with wound cleanser prior to applying a clean dressing using gauze sponges, not tissue or cotton balls. Peri-Wound Care: Skin Prep (Generic) 2 x Per Day/15 Days Discharge Instructions: Use skin prep as directed Prim Dressing: MediHoney Calcium Alginate Dressing 4x5 in 2 x Per Day/15 Days ary Discharge Instructions: Apply to wound bed as instructed Secondary Dressing: ALLEVYN Gentle Border, 3x3 (in/in) (Generic) 2 x Per Day/15 Days Discharge Instructions: Apply over primary dressing as directed. Secondary Dressing: Woven Gauze Sponge, Non-Sterile 4x4 in (Generic) 2 x Per Day/15 Days Discharge Instructions: Apply over primary dressing as directed. Electronic Signature(s) Signed: 03/19/2022 4:09:14 PM By:  Kalman Shan DO Entered By: Kalman Shan on 03/19/2022 16:06:13 -------------------------------------------------------------------------------- Problem List Details Patient Name: Date of Service: AVYANA, FURRY. 03/19/2022 3:15 PM Medical Record Number: [id number] Patient Account Number: [id number] Date of Birth/Sex: Treating RN: Mar 18, 1996 (26 y.o. [name], [name] Primary Care Provider: [name] Other Clinician: Referring Provider: Treating Provider/Extender: Elsworth Soho in Treatment: 57 Active Problems ICD-10 Encounter Code Description Active Date MDM Diagnosis [phone] Non-pressure chronic ulcer of other part of left lower leg with fat layer exposed11/10/2020 No Yes L97.812 Non-pressure chronic ulcer of other part of right lower leg with fat layer 06/29/2021 No Yes exposed M32.9 Systemic lupus erythematosus, unspecified 02/14/2021 No Yes Inactive Problems ICD-10 Code Description Active Date Inactive Date L97.819 Non-pressure chronic ulcer of other part of right lower leg with unspecified severity 02/13/2021 02/13/2021 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 with unspecified severity 02/13/2021 02/13/2021 [id number] Unspecified open wound, left lower leg, initial encounter 04/21/2021 04/21/2021 S91.002A Unspecified open wound, left ankle, initial encounter 04/21/2021 04/21/2021 Resolved Problems ICD-10 Code Description Active Date Resolved Date L03.116 Cellulitis of left lower limb 08/24/2021 08/24/2021 Electronic Signature(s) Signed: 03/19/2022 4:09:14 PM By: Kalman Shan DO Entered By: Kalman Shan on 03/19/2022 16:01:55 -------------------------------------------------------------------------------- Progress Note Details Patient Name: Date of Service: Kelly Lou RIA Cipriano Bunker. 03/19/2022 3:15 PM Medical Record Number: [id number] Patient Account Number: [id number] Date of Birth/Sex: Treating  RN: [birth year] (26 y.o. [name], [name] Primary Care Provider: [name] Other Clinician: Referring Provider: Treating Provider/Extender: Elsworth Soho in Treatment: 57 Subjective Chief Complaint Information obtained from Patient Bilateral lower extremity wounds 04/21/21: left lower extremity wound s/p cellulitis 09/01/21; right lower extremity wound History of Present Illness (HPI) Admission 6/21 Ms. Delijah Cashman is A 26 year old female with a past medical history of systemic lupus erythematosus that presents with bilateral lower extremity wounds that started in April 2022. She has had skin issues in the past where she developed very small wounds but these healed with time. She has never had open wounds like she does on her legs. She currently reports minimal drainage to the wounds. She does have pain to these areas but has been overall stable. She denies signs of infection. She has visited the ED for this issue and was recently prescribed doxycycline for possible cellulitis. She follows with [facility] for her SLE and is currently on Plaquenil, prednisone and CellCept. 6/27; patient presents for 1 week follow-up. She states she saw her rheumatologist on 6/22 and prednisone was increased from 10 mg to 20 mg daily. She is currently at the highest dose of Plaquenil and CellCept and is going to try an infusion later today at her regimen. She reports improvement to the wounds. She has been using collagen with dressing changes. She currently denies signs of infection. Readmission 8/26 Patient presents to clinic today for open wounds to her left lower extremity. Her previous wounds that were treated have closed. She states that on 8/9 she developed cellulitis and was treated with 2 rounds of antibiotics. Her cellulitis has resolved. She had blisters that opened and are now large wounds. She has seen her dermatologist and rheumatologist since she has  systemic lupus and a hard time healing. No changes have been made to her medications. She was recommended antibiotic ointment by her dermatologist to her wounds She picked up the antibiotic ointment today.. She reports pain to these areas. She denies purulent drainage, increased warmth or erythema to the left lower  extremity. She currently keeps the areas covered. Of note she had an Achilles tendon rupture on her right leg and is currently in a soft cast. 9/13; patient presents for follow-up. She missed her last clinic appointment. She has been using Santyl to the wound beds daily. She has no issues or complaints today. She reports the Santyl is helping clean up the wounds. She currently denies signs of infection. 9/22; patient states that 1 week ago she was seen by dermatology and they placed an Unna boot with Bactroban on her left lower extremity. Today she presents with a Unna boot in place. She currently denies signs of infection. 10/7; patient presents for follow-up. She reports improvement. She has been using Santyl to the areas of nonviable tissue and collagen to granulation tissue. She has been approved for infusions for her lupus. She is going to start these soon. These will be injection she can do at home. She currently denies signs of infection. 10/21; patient presents for follow-up. She reports continued improvement to her wound healing. She has been using Santyl to all areas except for the superior posterior left leg wound and she is using collagen to this. She has started her injection therapy for lupus. She started 1 week ago. She currently denies signs of infection. 11/3; patient presents for follow-up. She has no issues or complaints today. She is on her third week of injection therapy for lupus and tolerating this well. She has no issues or complaints today. She denies signs of infection. 11/17; patient presents for 2-week follow-up. She reports improvement in wound healing. She  denies signs of infection. 12/1; patient presents for follow-up. She has been using collagen to the anterior left lower extremity wounds and Santyl to the left lateral malleolus. She has no issues or complaints today. 12/15; patient presents for follow-up. She is using collagen to the 2 anterior left leg wounds and Santyl to the left lateral malleolus wound. She reports improvement in wound healing. She denies signs of infection. 12/22; patient presents because she is having purulent drainage from her anterior left leg wound. She reports no inciting event and states that the area started draining spontaneously. She was hoping that the drainage would stop however this has not improved. She states this started 5 days ago. She reports tenderness to the wound bed. 12/29; patient presents for follow-up. She was able to take Augmentin for 4 days but did not tolerate the GI side effects. She continues to take doxycycline. She reports improvement in drainage. She no longer has increased warmth to the surrounding wound bed. She ran out of [place] and has been using Vaseline to the wound beds. 1/6; Patient presents for follow-up. She has been taking Keflex without issues. She reports improvement in drainage and wound healing T the left lower o extremity. Unfortunately she developed another wound spontaneously to the right anterior lower extremity. She denies systemic signs of infection. 1/16; patient presents for follow-up. Patient completed her first prescription of Keflex and has not started the second. She reports no more drainage to the wound beds. She states there was an issue with insurance however she is going to be able to obtain Santyl soon. She has been using Dakin's wet-to-dry dressings to the right anterior leg and left lateral ankle. She has been using collagen to the distal anterior left leg wound and reports that the proximal anterior left leg wound has healed. She denies systemic signs of  infection. 1/31; patient presents for follow-up. She reports improvement to  the left leg wounds. She reports increased inflammation to the right leg wound. She thinks she is having a reaction to the foam border dressing on the right leg. She is unable to obtain Santyl. She has been using Dakin's wet-to-dry dressings to the right anterior leg and left lateral ankle. She has been using collagen to the distal anterior left leg wound. 2/13; patient presents for follow-up. She reports improvement in her wound healing. One of the wounds to the anterior aspect of the left lower leg has slightly reopened. She denies signs of infection. 2/27; patient presents for follow-up. She has no issues or complaints today. She denies signs of infection. 3/20; patient presents for follow-up. She has been using Medihoney to the right lower extremity wound and left lateral ankle wound. She continues to use collagen to the left anterior leg wound. She has no issues or complaints today. She denies signs of infection. She reports improvement in wound 4/10; patient has been using Medihoney to the right anterior lower leg wound and silver collagen on the left including the left ankle and left anterior. Fortunately the left anterior lower leg wound is healed. Both of the areas on the left ankle and the right anterior lower leg appear clean. These wounds are in the setting of chronic lupus. She has had multitude of chronic wounds and a rash on her arms. She tells me she has had both her arms and legs biopsied by dermatology at [facility] and also follows with rheumatology at [facility] although she is not had an appointment this year. She does not have a history of blood clots either arterial or venous. 4/24; patient presents for follow-up. She has been using collagen to the open wound beds. She has developed new areas to her right lower leg. She currently denies signs of infection. 5/9; patient presents for follow-up. She states she is  seeing her rheumatologist next week. She has been using Medihoney to all the wound beds except for the lateral left malleolus. She has been using collegen here. She reports improvement to some areas and other areas are flaring due to her lupus. She currently denies signs of infection. 5/23; Patient saw her rheumatologist last week. She has no active lupus activity at this time. Other options for treatment were given. She is going to continue with her current therapy of weekly injections. She reports improvement to Her wound healing on the left ankle. She has been putting Medihoney here. On the right lower extremity she has had a flareup and has 2 open ulcers. She currently denies signs of infection including increased warmth, erythema or purulent drainage. She has been using Medihoney here as well. 7/10; patient presents for follow-up. She was evaluated in the ED on 6/21 and prescribed Doxycycline For worsening pain to her right lower extremity with increased erythema. She subsequently developed a systemic reaction from the antibiotic and had A diffuse rash. There was concern that her lupus is not well controlled. It was recommended that she start Belimumab. She states she is starting the infusions next week. She has using silver alginate to the wound beds. She currently denies systemic signs of infection. 7/24; patient presents for follow-up. She started her infusion on 7/18 and tolerated this well. She has been using Medihoney to the wound beds. The anterior right leg wound has increased in size. She is currently on Keflex prescribed by her primary care physician. She denies signs of systemic infection. Patient History Information obtained from Patient. Family History Diabetes - Paternal  Grandparents, Hypertension - Paternal Grandparents, Thyroid Problems - Maternal Grandparents,Paternal Grandparents, No family history of Cancer, Heart Disease, Hereditary Spherocytosis, Kidney Disease, Lung  Disease, Seizures, Stroke, Tuberculosis. Social History Never smoker, Marital Status - Married, Alcohol Use - Moderate, Drug Use - Current History - Marijuana, Caffeine Use - Daily - Coffee. Medical History Respiratory Patient has history of Asthma Immunological Patient has history of Lupus Erythematosus Objective Constitutional respirations regular, non-labored and within target range for patient.. Vitals Time Taken: 3:31 PM, Height: 62 in, Temperature: 98.5 F, Pulse: 97 bpm, Respiratory Rate: 18 breaths/min, Blood Pressure: 152/104 mmHg. Cardiovascular 2+ dorsalis pedis/posterior tibialis pulses. Psychiatric pleasant and cooperative. General Notes: Right lower extremity: T the medial aspect there are 3 open wounds with granulation tissue and nonviable tissue. No surrounding soft tissue o infection. Integumentary (Hair, Skin) Wound #10 status is Open. Original cause of wound was Gradually Appeared. The date acquired was: 12/18/2021. The wound has been in treatment 13 weeks. The wound is located on the Right,Medial Lower Leg. The wound measures 5.5cm length x 3.8cm width x 0.7cm depth; 16.415cm^2 area and 11.49cm^3 volume. There is tendon and Fat Layer (Subcutaneous Tissue) exposed. There is no tunneling noted, however, there is undermining starting at 7:00 and ending at 1:00 with a maximum distance of 2.2cm. There is a medium amount of serosanguineous drainage noted. The wound margin is well defined and not attached to the wound base. There is large (67-100%) red, pink granulation within the wound bed. There is a small (1-33%) amount of necrotic tissue within the wound bed including Adherent Slough. Wound #11 status is Open. Original cause of wound was Gradually Appeared. The date acquired was: 12/25/2021. The wound has been in treatment 10 weeks. The wound is located on the Right,Posterior Lower Leg. The wound measures 0.3cm length x 0.3cm width x 0.2cm depth; 0.071cm^2 area and 0.014cm^3  volume. There is Fat Layer (Subcutaneous Tissue) exposed. There is no tunneling or undermining noted. There is a medium amount of serosanguineous drainage noted. The wound margin is distinct with the outline attached to the wound base. There is large (67-100%) red, pink granulation within the wound bed. There is a small (1-33%) amount of necrotic tissue within the wound bed including Adherent Slough. Wound #12 status is Open. Original cause of wound was Gradually Appeared. The date acquired was: 03/19/2022. The wound is located on the Right,Anterior Lower Leg. The wound measures 0.9cm length x 0.8cm width x 0.2cm depth; 0.565cm^2 area and 0.113cm^3 volume. There is Fat Layer (Subcutaneous Tissue) exposed. There is no tunneling or undermining noted. There is a medium amount of serosanguineous drainage noted. There is small (1-33%) red granulation within the wound bed. There is a large (67-100%) amount of necrotic tissue within the wound bed including Adherent Slough. 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 She has 3 wounds to the right lower extremity. I recommended Medihoney to the wound beds except for the largest wound. I recommended Dakin's wet-to-dry dressing here. Follow-up in 1 week. She has started her infusions for SLE. I am hopeful that this will help improve her symptoms. Unfortunately wound care is limited since her underlying issue is not controlled. Plan Follow-up Appointments: Return Appointment in 1 week. - Dr Heber [name] - [name] Other: - ****Continue taking cephalexin as your  PCP prescribed**** ***First infusion July 18,2023*** Bathing/ Shower/ Hygiene: May shower and wash wound with soap and water. Edema Control - Lymphedema / SCD /  Other: Elevate legs to the level of the heart or above for 30 minutes daily and/or when sitting, a frequency of: - 3-4 times a day throughout the day. Avoid standing for long periods of time. Additional Orders / Instructions: Follow Nutritious Diet WOUND #10: - Lower Leg Wound Laterality: Right, Medial Cleanser: Soap and Water 2 x Per Day/15 Days Discharge Instructions: May shower and wash wound with dial antibacterial soap and water prior to dressing change. Cleanser: Wound Cleanser (Generic) 2 x Per Day/15 Days Discharge Instructions: Cleanse the wound with wound cleanser prior to applying a clean dressing using gauze sponges, not tissue or cotton balls. Peri-Wound Care: Skin Prep (DME) (Generic) 2 x Per Day/15 Days Discharge Instructions: Use skin prep as directed Prim Dressing: Dakin's Solution 0.25%, 16 (oz) 2 x Per Day/15 Days ary Discharge Instructions: Moisten gauze with Dakin's solution Secondary Dressing: Woven Gauze Sponge, Non-Sterile 4x4 in (DME) (Generic) 2 x Per Day/15 Days Discharge Instructions: Apply over primary dressing as directed. Secondary Dressing: Zetuvit Plus Silicone Border Dressing 7x7(in/in) (DME) (Generic) 2 x Per Day/15 Days Discharge Instructions: Apply silicone border over primary dressing as directed. WOUND #11: - Lower Leg Wound Laterality: Right, Posterior Cleanser: Soap and Water 2 x Per [id number] Days Discharge Instructions: May shower and wash wound with dial antibacterial soap and water prior to dressing change. Cleanser: Wound Cleanser (Generic) 2 x Per Day/15 Days Discharge Instructions: Cleanse the wound with wound cleanser prior to applying a clean dressing using gauze sponges, not tissue or cotton balls. Peri-Wound Care: Skin Prep (Generic) 2 x Per Day/15 Days Discharge Instructions: Use skin prep as directed Prim Dressing: MediHoney Calcium Alginate Dressing 4x5 in (Generic) 2 x Per Day/15 Days ary Discharge Instructions: Apply to wound bed as  instructed Secondary Dressing: Woven Gauze Sponge, Non-Sterile 4x4 in 2 x Per Day/15 Days Discharge Instructions: Apply over primary dressing as directed. Secondary Dressing: Zetuvit Plus Silicone Border Dressing 7x7(in/in) 2 x Per [id number] Days Discharge Instructions: Apply silicone border over primary dressing as directed. WOUND #12: - Lower Leg Wound Laterality: Right, Anterior Cleanser: Soap and Water 2 x Per [id number] Days Discharge Instructions: May shower and wash wound with dial antibacterial soap and water prior to dressing change. Cleanser: Wound Cleanser (Generic) 2 x Per Day/15 Days Discharge Instructions: Cleanse the wound with wound cleanser prior to applying a clean dressing using gauze sponges, not tissue or cotton balls. Peri-Wound Care: Skin Prep (Generic) 2 x Per Day/15 Days Discharge Instructions: Use skin prep as directed Prim Dressing: MediHoney Calcium Alginate Dressing 4x5 in 2 x Per Day/15 Days ary Discharge Instructions: Apply to wound bed as instructed Secondary Dressing: ALLEVYN Gentle Border, 3x3 (in/in) (Generic) 2 x Per Day/15 Days Discharge Instructions: Apply over primary dressing as directed. Secondary Dressing: Woven Gauze Sponge, Non-Sterile 4x4 in (Generic) 2 x Per Day/15 Days Discharge Instructions: Apply over primary dressing as directed. 1. Medihoney 2. Dakin's wet-to-dry dressings 3. Follow-up in 1 week Electronic Signature(s) Signed: 03/19/2022 4:09:14 PM By: Kalman Shan DO Entered By: Kalman Shan on 03/19/2022 16:08:01 -------------------------------------------------------------------------------- HxROS Details Patient Name: Date of Service: Kelly Lou [name] 03/19/2022 3:15 PM Medical Record Number: [id number] Patient Account Number: [id number] Date of Birth/Sex: Treating RN: 11-16-1995 (26 y.o. [name], [name] Primary Care Provider: [name] Other Clinician: Referring Provider: Treating Provider/Extender: Elsworth Soho  in Treatment: 57 Information Obtained From Patient Respiratory Medical History: Positive for: Asthma Immunological Medical History: Positive for: Lupus Erythematosus Immunizations Pneumococcal Vaccine: Received Pneumococcal Vaccination: No Implantable Devices None Family and Social History Cancer: No; Diabetes: Yes - Paternal Grandparents; Heart Disease: No; Hereditary Spherocytosis: No; Hypertension: Yes - Paternal Grandparents; Kidney Disease: No; Lung Disease: No; Seizures: No; Stroke: No; Thyroid Problems: Yes - Maternal Grandparents,Paternal Grandparents; Tuberculosis: No; Never smoker; Marital Status - Married; Alcohol Use: Moderate; Drug Use: Current History - Marijuana; Caffeine Use: Daily - Coffee; Financial Concerns: No; Food, Clothing or Shelter Needs: No; Support System Lacking: No Electronic Signature(s) Signed: 03/19/2022 4:09:14 PM By: Kalman Shan DO Signed: 04/02/2022 4:45:01 PM By: Rhae Hammock RN Entered By: Kalman Shan on 03/19/2022 16:03:52 -------------------------------------------------------------------------------- SuperBill Details Patient Name: Date of Service: Kelly Lou [name] 03/19/2022 Medical Record Number: [id number] Patient Account Number: [id number] Date of Birth/Sex: Treating RN: 1996-06-03 (26 y.o. Donalda Ewings Primary Care Provider: [name] Other Clinician: Referring Provider: Treating Provider/Extender: Elsworth Soho in Treatment: 57 Diagnosis Coding ICD-10 Codes Code Description [id number] Non-pressure chronic ulcer of other part of left lower leg with fat layer exposed L97.812 Non-pressure chronic ulcer of other part of right lower leg with fat layer exposed M32.9 Systemic lupus erythematosus, unspecified Facility Procedures CPT4 Code: [id number] Description: 99214 - WOUND CARE VISIT-LEV 4 EST PT Modifier: 24 Quantity: 1 Physician Procedures :  CPT4 Code Description Modifier [id number] 99213 - WC PHYS LEVEL 3 - EST PT ICD-10 Diagnosis Description L97.822 Non-pressure chronic ulcer of other part of left lower leg with fat layer exposed L97.812 Non-pressure chronic ulcer of other part of right  lower leg with fat layer exposed M32.9 Systemic lupus erythematosus, unspecified Quantity: 1 Electronic Signature(s) Signed: 03/19/2022 4:09:14 PM By: Kalman Shan DO Entered By: Kalman Shan on 03/19/2022 16:08:10

## 2022-04-09 ENCOUNTER — Encounter (HOSPITAL_BASED_OUTPATIENT_CLINIC_OR_DEPARTMENT_OTHER): Payer: Managed Care, Other (non HMO) | Admitting: Internal Medicine

## 2022-04-09 DIAGNOSIS — L97822 Non-pressure chronic ulcer of other part of left lower leg with fat layer exposed: Secondary | ICD-10-CM | POA: Diagnosis not present

## 2022-04-09 DIAGNOSIS — L97812 Non-pressure chronic ulcer of other part of right lower leg with fat layer exposed: Secondary | ICD-10-CM

## 2022-04-13 ENCOUNTER — Telehealth: Payer: Managed Care, Other (non HMO) | Admitting: Family Medicine

## 2022-04-13 DIAGNOSIS — L03119 Cellulitis of unspecified part of limb: Secondary | ICD-10-CM | POA: Diagnosis not present

## 2022-04-13 MED ORDER — CEPHALEXIN 500 MG PO CAPS: 500.0000 mg | ORAL_CAPSULE | Freq: Four times a day (QID) | ORAL | 0 refills | Status: AC

## 2022-04-13 NOTE — Progress Notes (Signed)
E Visit for Cellulitis  We are sorry that you are not feeling well. Here is how we plan to help!  Based on what you shared with me it looks like you have cellulitis.  Cellulitis looks like areas of skin redness, swelling, and warmth; it develops as a result of bacteria entering under the skin. Little red spots and/or bleeding can be seen in skin, and tiny surface sacs containing fluid can occur. Fever can be present. Cellulitis is almost always on one side of a body, and the lower limbs are the most common site of involvement.   I have prescribed:  Keflex 500mg take one by mouth four times a day for 5 days  HOME CARE:  Take your medications as ordered and take all of them, even if the skin irritation appears to be healing.   GET HELP RIGHT AWAY IF:  Symptoms that don't begin to go away within 48 hours. Severe redness persists or worsens If the area turns color, spreads or swells. If it blisters and opens, develops yellow-brown crust or bleeds. You develop a fever or chills. If the pain increases or becomes unbearable.  Are unable to keep fluids and food down.  MAKE SURE YOU   Understand these instructions. Will watch your condition. Will get help right away if you are not doing well or get worse.  Thank you for choosing an e-visit.  Your e-visit answers were reviewed by a board certified advanced clinical practitioner to complete your personal care plan. Depending upon the condition, your plan could have included both over the counter or prescription medications.  Please review your pharmacy choice. Make sure the pharmacy is open so you can pick up prescription now. If there is a problem, you may contact your provider through MyChart messaging and have the prescription routed to another pharmacy.  Your safety is important to us. If you have drug allergies check your prescription carefully.   For the next 24 hours you can use MyChart to ask questions about today's visit, request a  non-urgent call back, or ask for a work or school excuse. You will get an email in the next two days asking about your experience. I hope that your e-visit has been valuable and will speed your recovery.   I have provided 5 minutes of non face to face time during this encounter for chart review and documentation.   

## 2022-04-13 NOTE — Progress Notes (Signed)
IONE, SANDUSKY ([id number]) Visit Report for 04/09/2022 Arrival Information Details Patient Name: Date of Service: Kelly [name]. 04/09/2022 3:15 PM Medical Record Number: [id number] Patient Account Number: [id number] Date of Birth/Sex: Treating RN: [birth year] (26 y.o. Kelly [name], Kelly [name] Primary Care Hubert Derstine: [name] Other Clinician: Referring Kelly [name]: Treating Sacora Hawbaker/Extender: Elsworth Soho in Treatment: 76 Visit Information History Since Last Visit Added or deleted any medications: No Patient Arrived: Ambulatory Any new allergies or adverse reactions: No Arrival Time: 15:28 Had a fall or experienced change in No Accompanied By: self activities of daily living that may affect Transfer Assistance: None risk of falls: Patient Identification Verified: Yes Signs or symptoms of abuse/neglect since last visito No Secondary Verification Process Completed: Yes Hospitalized since last visit: No Patient Requires Transmission-Based Precautions: No Implantable device outside of the clinic excluding No Patient Has Alerts: Yes cellular tissue based products placed in the center Patient Alerts: Left ABi=1.18 since last visit: Has Dressing in Place as Prescribed: Yes Pain Present Now: No Electronic Signature(s) Signed: 04/09/2022 4:35:20 PM By: Deon Pilling RN, BSN Entered By: Deon Pilling on 04/09/2022 15:32:36 -------------------------------------------------------------------------------- Encounter Discharge Information Details Patient Name: Date of Service: Kelly [name]. 04/09/2022 3:15 PM Medical Record Number: [id number] Patient Account Number: [id number] Date of Birth/Sex: Treating RN: 04-14-1996 (26 y.o. Kelly [name], Kelly [name] Primary Care Rashidah Belle[place]: [name] Other Clinician: Referring Emonte Dieujuste: Treating Kelly [name]/Extender: Elsworth Soho in Treatment: 60 Encounter Discharge Information Items  Post Procedure Vitals Discharge Condition: Stable Temperature (F): 98.7 Ambulatory Status: Ambulatory Pulse (bpm): 74 Discharge Destination: Home Respiratory Rate (breaths/min): 17 Transportation: Private Auto Blood Pressure (mmHg): 120/80 Accompanied By: self Schedule Follow-up Appointment: Yes Clinical Summary of Care: Patient Declined Electronic Signature(s) Signed: 04/09/2022 4:24:40 PM By: Rhae Hammock RN Entered By: Rhae Hammock on 04/09/2022 15:55:17 -------------------------------------------------------------------------------- Lower Extremity Assessment Details Patient Name: Date of Service: Kelly [name]. 04/09/2022 3:15 PM Medical Record Number: [id number] Patient Account Number: [id number] Date of Birth/Sex: Treating RN: 05-Feb-1996 (26 y.o. Kelly [name], Kelly [name] Primary Care Ted Leonhart: [name] Other Clinician: Referring Taela Charbonneau: Treating Kelly [name]/Extender: Elsworth Soho in Treatment: 60 Edema Assessment Assessed: Shirlyn Goltz: No] Kelly [name]: Yes] Edema: [Left: Yes] [Right: Yes] Calf Left: Right: Point of Measurement: 27 cm From Medial Instep 33 cm Ankle Left: Right: Point of Measurement: 8 cm From Medial Instep 23 cm Vascular Assessment Pulses: Dorsalis Pedis Palpable: [Right:Yes] Electronic Signature(s) Signed: 04/09/2022 4:35:20 PM By: Deon Pilling RN, BSN Entered By: Deon Pilling on 04/09/2022 15:33:06 -------------------------------------------------------------------------------- Multi Wound Chart Details Patient Name: Date of Service: Kelly [name]. 04/09/2022 3:15 PM Medical Record Number: [id number] Patient Account Number: [id number] Date of Birth/Sex: Treating RN: Jul 11, 1996 (26 y.o. Kelly [name], Kelly [name] Primary Care Adeleine Pask: [name] Other Clinician: Referring Velma Hanna: Treating Maleigh Bagot/Extender: Elsworth Soho in Treatment: 60 Vital Signs Height(in):  62 Pulse(bpm): 102 Weight(lbs): Blood Pressure(mmHg): 110/78 Body Mass Index(BMI): Temperature(F): 98.6 Respiratory Rate(breaths/min): 20 Photos: [10:Right, Medial Lower Leg] [12:Right, Anterior Lower Leg] [N/A:N/A N/A] Wound Location: [10:Gradually Appeared] [12:Gradually Appeared] [N/A:N/A] Wounding Event: [10:Lupus] [12:Auto-immune] [N/A:N/A] Primary Etiology: [10:Asthma, Lupus Erythematosus] [12:Asthma, Lupus Erythematosus] [N/A:N/A] Comorbid History: [10:12/18/2021] [12:03/19/2022] [N/A:N/A] Date Acquired: [10:16] [12:3] [N/A:N/A] Weeks of Treatment: [10:Open] [12:Open] [N/A:N/A] Wound Status: [10:No] [12:No] [N/A:N/A] Wound Recurrence: [10:Yes] [12:No] [N/A:N/A] Clustered Wound: [10:1] [12:N/A] [N/A:N/A] Clustered Quantity: [10:3.4x3.1x0.7] [12:0.6x0.7x0.3] [N/A:N/A] Measurements L x W x D (cm) [10:8.278] [12:0.33] [N/A:N/A] A (cm) : rea [10:5.795] [12:0.099] [N/A:N/A] Volume (cm) : [10:33.10%] [12:41.60%] [N/A:N/A] %  Reduction in A rea: [10:-56.20%] [12:12.40%] [N/A:N/A] % Reduction in Volume: [10:7] Starting Position 1 (o'clock): [10:12] Ending Position 1 (o'clock): [10:1.5] Maximum Distance 1 (cm): [10:Yes] [12:No] [N/A:N/A] Undermining: [10:Full Thickness With Exposed Support Full Thickness Without Exposed] [N/A:N/A] Classification: [10:Structures Medium] [12:Support Structures Medium] [N/A:N/A] Exudate A mount: [10:Serosanguineous] [12:Serosanguineous] [N/A:N/A] Exudate Type: [10:red, brown] [12:red, brown] [N/A:N/A] Exudate Color: [10:Well defined, not attached] [12:Epibole] [N/A:N/A] Wound Margin: [10:Large (67-100%)] [12:Small (1-33%)] [N/A:N/A] Granulation A mount: [10:Red, Pink] [12:Red] [N/A:N/A] Granulation Quality: [10:Small (1-33%)] [12:Large (67-100%)] [N/A:N/A] Necrotic A mount: [10:Fat Layer (Subcutaneous Tissue): Yes Fat Layer (Subcutaneous Tissue): Yes N/A] Exposed Structures: [10:Fascia: No Tendon: No Muscle: No Joint: No Bone: No Small (1-33%)]  [12:None] [N/A:N/A] Epithelialization: [10:Debridement - Excisional] [12:Debridement - Selective/Open Wound N/A] Debridement: Pre-procedure Verification/Time Out 15:40 [12:15:40] [N/A:N/A] Taken: [10:Lidocaine] [12:Lidocaine] [N/A:N/A] Pain Control: [10:Subcutaneous, Slough] [12:Slough] [N/A:N/A] Tissue Debrided: [10:Skin/Subcutaneous Tissue] [12:Non-Viable Tissue] [N/A:N/A] Level: [10:10.54] [12:0.42] [N/A:N/A] Debridement A (sq cm): [10:rea Curette] [12:Curette] [N/A:N/A] Instrument: [10:Minimum] [12:Minimum] [N/A:N/A] Bleeding: [10:Pressure] [12:Pressure] [N/A:N/A] Hemostasis A chieved: [10:0] [12:0] [N/A:N/A] Procedural Pain: [10:0] [12:0] [N/A:N/A] Post Procedural Pain: [10:Procedure was tolerated well] [12:Procedure was tolerated well] [N/A:N/A] Debridement Treatment Response: [10:3.4x3.1x0.7] [12:0.6x0.7x0.3] [N/A:N/A] Post Debridement Measurements L x W x D (cm) [10:5.795] [12:0.099] [N/A:N/A] Post Debridement Volume: (cm) [10:Debridement] [12:Debridement] [N/A:N/A] Treatment Notes Electronic Signature(s) Signed: 04/09/2022 4:17:44 PM By: Kalman Shan DO Signed: 04/09/2022 4:24:40 PM By: Rhae Hammock RN Entered By: Kalman Shan on 04/09/2022 15:46:14 -------------------------------------------------------------------------------- Multi-Disciplinary Care Plan Details Patient Name: Date of Service: Kelly [name]. 04/09/2022 3:15 PM Medical Record Number: [id number] Patient Account Number: [id number] Date of Birth/Sex: Treating RN: 1996/03/18 (26 y.o. Kelly [name], Kelly [name] Primary Care Shalana Jardin: [name] Other Clinician: Referring Flay Ghosh: Treating Danel Studzinski/Extender: Elsworth Soho in Treatment: 28 Multidisciplinary Care Plan reviewed with physician Active Inactive Wound/Skin Impairment Nursing Diagnoses: Impaired tissue integrity Goals: Patient/caregiver will verbalize understanding of skin care regimen Date  Initiated: 02/13/2021 Target Resolution Date: 04/27/2022 Goal Status: Active Ulcer/skin breakdown will have a volume reduction of 30% by week 4 Date Initiated: 02/13/2021 Date Inactivated: 04/21/2021 Target Resolution Date: 03/13/2021 Unmet Reason: pt did not return to Goal Status: Unmet clinic for F/U Ulcer/skin breakdown will have a volume reduction of 80% by week 12 Date Initiated: 04/21/2021 Date Inactivated: 07/13/2021 Target Resolution Date: 07/27/2021 Goal Status: Met Interventions: Assess patient/caregiver ability to obtain necessary supplies Assess patient/caregiver ability to perform ulcer/skin care regimen upon admission and as needed Assess ulceration(s) every visit Provide education on ulcer and skin care Treatment Activities: Skin care regimen initiated : 02/13/2021 Topical wound management initiated : 02/13/2021 Notes: 06/29/21: Wounds 3 and 6 only at 80% volume reduction. Target date extended. Electronic Signature(s) Signed: 04/09/2022 4:24:40 PM By: Rhae Hammock RN Entered By: Rhae Hammock on 04/09/2022 15:54:29 -------------------------------------------------------------------------------- Pain Assessment Details Patient Name: Date of Service: Kelly [name]. 04/09/2022 3:15 PM Medical Record Number: [id number] Patient Account Number: [id number] Date of Birth/Sex: Treating RN: 1995-11-29 (25 y.o. Debby Bud Primary Care Malaina Mortellaro: [name] Other Clinician: Referring Trypp Heckmann: Treating Mekenna Finau/Extender: Elsworth Soho in Treatment: 60 Active Problems Location of Pain Severity and Description of Pain Patient Has Paino No Site Locations Rate the pain. Current Pain Level: 0 Pain Management and Medication Current Pain Management: Medication: No Cold Application: No Rest: No Massage: No Activity: No T.E.N.S.: No Heat Application: No Leg drop or elevation: No Is the Current Pain Management Adequate:  Adequate How does your wound impact your activities of daily livingo  Sleep: No Bathing: No Appetite: No Relationship With Others: No Bladder Continence: No Emotions: No Bowel Continence: No Work: No Toileting: No Drive: No Dressing: No Hobbies: No Engineer, maintenance) Signed: 04/09/2022 4:35:20 PM By: Deon Pilling RN, BSN Entered By: Deon Pilling on 04/09/2022 15:32:55 -------------------------------------------------------------------------------- Patient/Caregiver Education Details Patient Name: Date of Service: Kelly [name] 8/14/2023andnbsp3:15 PM Medical Record Number: [id number] Patient Account Number: [id number] Date of Birth/Gender: Treating RN: 05-25-1996 (26 y.o. Kelly [name], Kelly [name] Primary Care Physician: [name] Other Clinician: Referring Physician: Treating Physician/Extender: Elsworth Soho in Treatment: 33 Education Assessment Education Provided To: Patient Education Topics Provided Wound/Skin Impairment: Methods: Explain/Verbal Responses: Reinforcements needed, State content correctly [employer]) Signed: 04/09/2022 4:24:40 PM By: Rhae Hammock RN Entered By: Rhae Hammock on 04/09/2022 15:54:40 -------------------------------------------------------------------------------- Wound Assessment Details Patient Name: Date of Service: Kelly [name]. 04/09/2022 3:15 PM Medical Record Number: [id number] Patient Account Number: [id number] Date of Birth/Sex: Treating RN: 12/26/1995 (26 y.o. Kelly [name], [name] Primary Care Jalon Blackwelder: [name] Other Clinician: Referring Jaydyn Menon: Treating Kierstan Auer/Extender: Elsworth Soho in Treatment: 60 Wound Status Wound Number: 10 Primary Etiology: Lupus Wound Location: Right, Medial Lower Leg Wound Status: Open Wounding Event: Gradually Appeared Comorbid History: Asthma, Lupus Erythematosus Date Acquired:  12/18/2021 Weeks Of Treatment: 16 Clustered Wound: Yes Photos Wound Measurements Length: (cm) 3.4 Width: (cm) 3.1 Depth: (cm) 0.7 Clustered Quantity: 1 Area: (cm) 8.278 Volume: (cm) 5.795 % Reduction in Area: 33.1% % Reduction in Volume: -56.2% Epithelialization: Small (1-33%) Tunneling: No Undermining: Yes Starting Position (o'clock): 7 Ending Position (o'clock): 12 Maximum Distance: (cm) 1.5 Wound Description Classification: Full Thickness With Exposed Support Structures Wound Margin: Well defined, not attached Exudate Amount: Medium Exudate Type: Serosanguineous Exudate Color: red, brown Foul Odor After Cleansing: No Slough/Fibrino Yes Wound Bed Granulation Amount: Large (67-100%) Exposed Structure Granulation Quality: Red, Pink Fascia Exposed: No Necrotic Amount: Small (1-33%) Fat Layer (Subcutaneous Tissue) Exposed: Yes Necrotic Quality: Adherent Slough Tendon Exposed: No Muscle Exposed: No Joint Exposed: No Bone Exposed: No Treatment Notes Wound #10 (Lower Leg) Wound Laterality: Right, Medial Cleanser Soap and Water Discharge Instruction: May shower and wash wound with dial antibacterial soap and water prior to dressing change. Wound Cleanser Discharge Instruction: Cleanse the wound with wound cleanser prior to applying a clean dressing using gauze sponges, not tissue or cotton balls. Peri-Wound Care Skin Prep Discharge Instruction: Use skin prep as directed Topical Primary Dressing Dakin's Solution 0.25%, 16 (oz) Discharge Instruction: Moisten gauze with Dakin's solution Secondary Dressing Woven Gauze Sponge, Non-Sterile 4x4 in Discharge Instruction: Apply over primary dressing as directed. Zetuvit Plus Silicone Border Dressing 5x5 (in/in) Discharge Instruction: Apply silicone border over primary dressing as directed. Secured With Compression Wrap Compression Stockings Environmental education officer) Signed: 04/09/2022 4:24:40 PM By: Rhae Hammock RN Signed: 04/09/2022 4:35:20 PM By: Deon Pilling RN, BSN Entered By: Rhae Hammock on 04/09/2022 15:34:19 -------------------------------------------------------------------------------- Wound Assessment Details Patient Name: Date of Service: Kelly [name]. 04/09/2022 3:15 PM Medical Record Number: [id number] Patient Account Number: [id number] Date of Birth/Sex: Treating RN: 11/05/1995 (26 y.o. Kelly [name], Kelly [name] Primary Care Chantay Whitelock: [name] Other Clinician: Referring Graham Doukas: Treating Daquana Paddock/Extender: Elsworth Soho in Treatment: 60 Wound Status Wound Number: 12 Primary Etiology: Auto-immune Wound Location: Right, Anterior Lower Leg Wound Status: Open Wounding Event: Gradually Appeared Comorbid History: Asthma, Lupus Erythematosus Date Acquired: 03/19/2022 Weeks Of Treatment: 3 Clustered Wound: No Photos Wound Measurements Length: (cm) 0.6 Width: (cm) 0.7 Depth: (  cm) 0.3 Area: (cm) 0.33 Volume: (cm) 0.099 % Reduction in Area: 41.6% % Reduction in Volume: 12.4% Epithelialization: None Tunneling: No Undermining: No Wound Description Classification: Full Thickness Without Exposed Support Structures Wound Margin: Epibole Exudate Amount: Medium Exudate Type: Serosanguineous Exudate Color: red, brown Foul Odor After Cleansing: No Slough/Fibrino Yes Wound Bed Granulation Amount: Small (1-33%) Exposed Structure Granulation Quality: Red Fat Layer (Subcutaneous Tissue) Exposed: Yes Necrotic Amount: Large (67-100%) Necrotic Quality: Adherent Slough Treatment Notes Wound #12 (Lower Leg) Wound Laterality: Right, Anterior Cleanser Soap and Water Discharge Instruction: May shower and wash wound with dial antibacterial soap and water prior to dressing change. Wound Cleanser Discharge Instruction: Cleanse the wound with wound cleanser prior to applying a clean dressing using gauze sponges, not tissue or cotton  balls. Peri-Wound Care Skin Prep Discharge Instruction: Use skin prep as directed Topical Primary Dressing MediHoney Calcium Alginate Dressing 4x5 in Discharge Instruction: Apply to wound bed as instructed Secondary Dressing Woven Gauze Sponge, Non-Sterile 4x4 in Discharge Instruction: Apply over primary dressing as directed. Zetuvit Plus Silicone Border Dressing 4x4 (in/in) Discharge Instruction: Apply silicone border over primary dressing as directed. Secured With [employer] Size 5, 10 (yds) Compression Wrap Compression Stockings Add-Ons Electronic Signature(s) Signed: 04/09/2022 4:24:40 PM By: Rhae Hammock RN Signed: 04/09/2022 4:35:20 PM By: Deon Pilling RN, BSN Entered By: Rhae Hammock on 04/09/2022 15:34:35 -------------------------------------------------------------------------------- Vitals Details Patient Name: Date of Service: Kelly [name]. 04/09/2022 3:15 PM Medical Record Number: [id number] Patient Account Number: [id number] Date of Birth/Sex: Treating RN: 31-Jul-1996 (26 y.o. Kelly [name], [name] Primary Care Houa Ackert: [name] Other Clinician: Referring Keaun Schnabel: Treating Bertice Risse/Extender: Elsworth Soho in Treatment: 60 Vital Signs Time Taken: 15:28 Temperature (F): 98.6 Height (in): 62 Pulse (bpm): 102 Respiratory Rate (breaths/min): 20 Blood Pressure (mmHg): 110/78 Reference Range: 80 - 120 mg / dl Electronic Signature(s) Signed: 04/09/2022 4:35:20 PM By: Deon Pilling RN, BSN Entered By: Deon Pilling on 04/09/2022 15:32:47

## 2022-04-13 NOTE — Progress Notes (Signed)
Kelly [name], Kelly [name] ([id number]) Visit Report for 04/09/2022 Chief Complaint Document Details Patient Name: Date of Service: Kelly [name], Kelly [name]. 04/09/2022 3:15 PM Medical Record Number: [id number] Patient Account Number: [id number] Date of Birth/Sex: Treating RN: 1996-04-16 (26 y.o. [name], [name] Primary Care Provider: Dione Booze Other Clinician: Referring Provider: Treating Provider/Extender: Gennaro Africa in Treatment: 60 Information Obtained from: Patient Chief Complaint Bilateral lower extremity wounds 04/21/21: left lower extremity wound s/p cellulitis 09/01/21; right lower extremity wound Electronic Signature(s) Signed: 04/09/2022 4:17:44 PM By: Geralyn Corwin DO Entered By: Geralyn Corwin on 04/09/2022 15:48:56 -------------------------------------------------------------------------------- Debridement Details Patient Name: Date of Service: Kelly Milling [name] 04/09/2022 3:15 PM Medical Record Number: [id number] Patient Account Number: [id number] Date of Birth/Sex: Treating RN: May 15, 1996 (26 y.o. [name], [name] Primary Care Provider: Dione Booze Other Clinician: Referring Provider: Treating Provider/Extender: Gennaro Africa in Treatment: 60 Debridement Performed for Assessment: Wound #12 Right,Anterior Lower Leg Performed By: Physician Geralyn Corwin, DO Debridement Type: Debridement Level of Consciousness (Pre-procedure): Awake and Alert Pre-procedure Verification/Time Out Yes - 15:40 Taken: Start Time: 15:40 Pain Control: Lidocaine T Area Debrided (L x W): otal 0.6 (cm) x 0.7 (cm) = 0.42 (cm) Tissue and other material debrided: Viable, Non-Viable, Slough, Slough Level: Non-Viable Tissue Debridement Description: Selective/Open Wound Instrument: Curette Bleeding: Minimum Hemostasis Achieved: Pressure End Time: 15:40 Procedural Pain: 0 Post Procedural Pain: 0 Response to Treatment:  Procedure was tolerated well Level of Consciousness (Post- Awake and Alert procedure): Post Debridement Measurements of Total Wound Length: (cm) 0.6 Width: (cm) 0.7 Depth: (cm) 0.3 Volume: (cm) 0.099 Character of Wound/Ulcer Post Debridement: Improved Post Procedure Diagnosis Same as Pre-procedure Electronic Signature(s) Signed: 04/09/2022 4:17:44 PM By: Geralyn Corwin DO Signed: 04/09/2022 4:24:40 PM By: Fonnie Mu RN Entered By: Fonnie Mu on 04/09/2022 15:42:41 -------------------------------------------------------------------------------- Debridement Details Patient Name: Date of Service: Kelly Milling [name] 04/09/2022 3:15 PM Medical Record Number: [id number] Patient Account Number: [id number] Date of Birth/Sex: Treating RN: 1996-02-22 (26 y.o. [name], [name] Primary Care Provider: Dione Booze Other Clinician: Referring Provider: Treating Provider/Extender: Gennaro Africa in Treatment: 60 Debridement Performed for Assessment: Wound #10 Right,Medial Lower Leg Performed By: Physician Geralyn Corwin, DO Debridement Type: Debridement Level of Consciousness (Pre-procedure): Awake and Alert Pre-procedure Verification/Time Out Yes - 15:40 Taken: Start Time: 15:40 Pain Control: Lidocaine T Area Debrided (L x W): otal 3.4 (cm) x 3.1 (cm) = 10.54 (cm) Tissue and other material debrided: Viable, Non-Viable, Slough, Subcutaneous, Slough Level: Skin/Subcutaneous Tissue Debridement Description: Excisional Instrument: Curette Bleeding: Minimum Hemostasis Achieved: Pressure End Time: 15:40 Procedural Pain: 0 Post Procedural Pain: 0 Response to Treatment: Procedure was tolerated well Level of Consciousness (Post- Awake and Alert procedure): Post Debridement Measurements of Total Wound Length: (cm) 3.4 Width: (cm) 3.1 Depth: (cm) 0.7 Volume: (cm) 5.795 Character of Wound/Ulcer Post Debridement: Improved Post  Procedure Diagnosis Same as Pre-procedure Electronic Signature(s) Signed: 04/09/2022 4:17:44 PM By: Geralyn Corwin DO Signed: 04/09/2022 4:24:40 PM By: Fonnie Mu RN Entered By: Fonnie Mu on 04/09/2022 15:43:07 -------------------------------------------------------------------------------- HPI Details Patient Name: Date of Service: Kelly Milling [name] 04/09/2022 3:15 PM Medical Record Number: [id number] Patient Account Number: [id number] Date of Birth/Sex: Treating RN: [birth year] (25 y.o. [name], [name] Primary Care Provider: Dione Booze Other Clinician: Referring Provider: Treating Provider/Extender: Gennaro Africa in Treatment: 60 History of Present Illness HPI Description: Admission 6/21 Kelly [name] is A 26 year old female with a past medical history of systemic  lupus erythematosus that presents with bilateral lower extremity wounds that started in April 2022. She has had skin issues in the past where she developed very small wounds but these healed with time. She has never had open wounds like she does on her legs. She currently reports minimal drainage to the wounds. She does have pain to these areas but has been overall stable. She denies signs of infection. She has visited the ED for this issue and was recently prescribed doxycycline for possible cellulitis. She follows with [facility] for her SLE and is currently on Plaquenil, prednisone and CellCept. 6/27; patient presents for 1 week follow-up. She states she saw her rheumatologist on 6/22 and prednisone was increased from 10 mg to 20 mg daily. She is currently at the highest dose of Plaquenil and CellCept and is going to try an infusion later today at her regimen. She reports improvement to the wounds. She has been using collagen with dressing changes. She currently denies signs of infection. Readmission 8/26 Patient presents to clinic today for open wounds to  her left lower extremity. Her previous wounds that were treated have closed. She states that on 8/9 she developed cellulitis and was treated with 2 rounds of antibiotics. Her cellulitis has resolved. She had blisters that opened and are now large wounds. She has seen her dermatologist and rheumatologist since she has systemic lupus and a hard time healing. No changes have been made to her medications. She was recommended antibiotic ointment by her dermatologist to her wounds She picked up the antibiotic ointment today.. She reports pain to these areas. She denies purulent drainage, increased warmth or erythema to the left lower extremity. She currently keeps the areas covered. Of note she had an Achilles tendon rupture on her right leg and is currently in a soft cast. 9/13; patient presents for follow-up. She missed her last clinic appointment. She has been using Santyl to the wound beds daily. She has no issues or complaints today. She reports the Santyl is helping clean up the wounds. She currently denies signs of infection. 9/22; patient states that 1 week ago she was seen by dermatology and they placed an Unna boot with Bactroban on her left lower extremity. Today she presents with a Unna boot in place. She currently denies signs of infection. 10/7; patient presents for follow-up. She reports improvement. She has been using Santyl to the areas of nonviable tissue and collagen to granulation tissue. She has been approved for infusions for her lupus. She is going to start these soon. These will be injection she can do at home. She currently denies signs of infection. 10/21; patient presents for follow-up. She reports continued improvement to her wound healing. She has been using Santyl to all areas except for the superior posterior left leg wound and she is using collagen to this. She has started her injection therapy for lupus. She started 1 week ago. She currently denies signs of infection. 11/3;  patient presents for follow-up. She has no issues or complaints today. She is on her third week of injection therapy for lupus and tolerating this well. She has no issues or complaints today. She denies signs of infection. 11/17; patient presents for 2-week follow-up. She reports improvement in wound healing. She denies signs of infection. 12/1; patient presents for follow-up. She has been using collagen to the anterior left lower extremity wounds and Santyl to the left lateral malleolus. She has no issues or complaints today. 12/15; patient presents for follow-up. She  is using collagen to the 2 anterior left leg wounds and Santyl to the left lateral malleolus wound. She reports improvement in wound healing. She denies signs of infection. 12/22; patient presents because she is having purulent drainage from her anterior left leg wound. She reports no inciting event and states that the area started draining spontaneously. She was hoping that the drainage would stop however this has not improved. She states this started 5 days ago. She reports tenderness to the wound bed. 12/29; patient presents for follow-up. She was able to take Augmentin for 4 days but did not tolerate the GI side effects. She continues to take doxycycline. She reports improvement in drainage. She no longer has increased warmth to the surrounding wound bed. She ran out of [place] and has been using Vaseline to the wound beds. 1/6; Patient presents for follow-up. She has been taking Keflex without issues. She reports improvement in drainage and wound healing T the left lower o extremity. Unfortunately she developed another wound spontaneously to the right anterior lower extremity. She denies systemic signs of infection. 1/16; patient presents for follow-up. Patient completed her first prescription of Keflex and has not started the second. She reports no more drainage to the wound beds. She states there was an issue with insurance  however she is going to be able to obtain Santyl soon. She has been using Dakin's wet-to-dry dressings to the right anterior leg and left lateral ankle. She has been using collagen to the distal anterior left leg wound and reports that the proximal anterior left leg wound has healed. She denies systemic signs of infection. 1/31; patient presents for follow-up. She reports improvement to the left leg wounds. She reports increased inflammation to the right leg wound. She thinks she is having a reaction to the foam border dressing on the right leg. She is unable to obtain Santyl. She has been using Dakin's wet-to-dry dressings to the right anterior leg and left lateral ankle. She has been using collagen to the distal anterior left leg wound. 2/13; patient presents for follow-up. She reports improvement in her wound healing. One of the wounds to the anterior aspect of the left lower leg has slightly reopened. She denies signs of infection. 2/27; patient presents for follow-up. She has no issues or complaints today. She denies signs of infection. 3/20; patient presents for follow-up. She has been using Medihoney to the right lower extremity wound and left lateral ankle wound. She continues to use collagen to the left anterior leg wound. She has no issues or complaints today. She denies signs of infection. She reports improvement in wound 4/10; patient has been using Medihoney to the right anterior lower leg wound and silver collagen on the left including the left ankle and left anterior. Fortunately the left anterior lower leg wound is healed. Both of the areas on the left ankle and the right anterior lower leg appear clean. These wounds are in the setting of chronic lupus. She has had multitude of chronic wounds and a rash on her arms. She tells me she has had both her arms and legs biopsied by dermatology at [facility] and also follows with rheumatology at [facility] although she is not had an appointment this year.  She does not have a history of blood clots either arterial or venous. 4/24; patient presents for follow-up. She has been using collagen to the open wound beds. She has developed new areas to her right lower leg. She currently denies signs of infection. 5/9; patient  presents for follow-up. She states she is seeing her rheumatologist next week. She has been using Medihoney to all the wound beds except for the lateral left malleolus. She has been using collegen here. She reports improvement to some areas and other areas are flaring due to her lupus. She currently denies signs of infection. 5/23; Patient saw her rheumatologist last week. She has no active lupus activity at this time. Other options for treatment were given. She is going to continue with her current therapy of weekly injections. She reports improvement to Her wound healing on the left ankle. She has been putting Medihoney here. On the right lower extremity she has had a flareup and has 2 open ulcers. She currently denies signs of infection including increased warmth, erythema or purulent drainage. She has been using Medihoney here as well. 7/10; patient presents for follow-up. She was evaluated in the ED on 6/21 and prescribed Doxycycline For worsening pain to her right lower extremity with increased erythema. She subsequently developed a systemic reaction from the antibiotic and had A diffuse rash. There was concern that her lupus is not well controlled. It was recommended that she start Belimumab. She states she is starting the infusions next week. She has using silver alginate to the wound beds. She currently denies systemic signs of infection. 7/24; patient presents for follow-up. She started her infusion on 7/18 and tolerated this well. She has been using Medihoney to the wound beds. The anterior right leg wound has increased in size. She is currently on Keflex prescribed by her primary care physician. She denies signs of systemic  infection. 8/1; patient presents for follow-up. She has been using Medihoney and Dakin's wet-to-dry dressings to the wound beds. She has no issues or complaints today. She denies signs of infection. The posterior right leg wound has healed. 8/14; patient presents for follow-up. She has been using Medihoney to the right lateral leg wound and Dakin's wet-to-dry to the right medial leg wound. She reports improvement in her chronic pain. She is scheduled to have her second infusion this week. She denies signs of infection. Electronic Signature(s) Signed: 04/09/2022 4:17:44 PM By: Geralyn Corwin DO Entered By: Geralyn Corwin on 04/09/2022 15:49:39 -------------------------------------------------------------------------------- Physical Exam Details Patient Name: Date of Service: Kelly Milling RIA Cherly Anderson. 04/09/2022 3:15 PM Medical Record Number: [id number] Patient Account Number: [id number] Date of Birth/Sex: Treating RN: 1995/12/22 (26 y.o. [name], [name] Primary Care Provider: Dione Booze Other Clinician: Referring Provider: Treating Provider/Extender: Gennaro Africa in Treatment: 60 Constitutional respirations regular, non-labored and within target range for patient.. Cardiovascular 2+ dorsalis pedis/posterior tibialis pulses. Psychiatric pleasant and cooperative. Notes Right lower extremity: 2 open wounds. The 1 to the lateral aspect has nonviable surface throughout and calcium deposits. The medial wound has granulation tissue and slough. No surrounding soft tissue infection. Electronic Signature(s) Signed: 04/09/2022 4:17:44 PM By: Geralyn Corwin DO Entered By: Geralyn Corwin on 04/09/2022 15:50:35 -------------------------------------------------------------------------------- Physician Orders Details Patient Name: Date of Service: Kelly Milling [name] 04/09/2022 3:15 PM Medical Record Number: [id number] Patient Account Number: [id number] Date  of Birth/Sex: Treating RN: 10-Mar-1996 (26 y.o. [name], [name] Primary Care Provider: Dione Booze Other Clinician: Referring Provider: Treating Provider/Extender: Gennaro Africa in Treatment: [phone] Verbal / Phone Orders: No Diagnosis Coding Follow-up Appointments ppointment in 2 weeks. - 04/23/22 @ 1515 W/ dR. Mandie Crabbe and LAURAN rM # 9 Return A Other: - taking two Lovenox 60mg  injections daily Bathing/ Shower/ Hygiene May shower and wash  wound with soap and water. Edema Control - Lymphedema / SCD / Other Elevate legs to the level of the heart or above for 30 minutes daily and/or when sitting, a frequency of: - 3-4 times a day throughout the day. Avoid standing for long periods of time. Additional Orders / Instructions Follow Nutritious Diet Wound Treatment Wound #10 - Lower Leg Wound Laterality: Right, Medial Cleanser: Soap and Water 2 x Per Day/15 Days Discharge Instructions: May shower and wash wound with dial antibacterial soap and water prior to dressing change. Cleanser: Wound Cleanser (DME) (Generic) 2 x Per Day/15 Days Discharge Instructions: Cleanse the wound with wound cleanser prior to applying a clean dressing using gauze sponges, not tissue or cotton balls. Peri-Wound Care: Skin Prep (DME) (Generic) 2 x Per Day/15 Days Discharge Instructions: Use skin prep as directed Prim Dressing: Dakin's Solution 0.25%, 16 (oz) 2 x Per Day/15 Days ary Discharge Instructions: Moisten gauze with Dakin's solution Secondary Dressing: Woven Gauze Sponge, Non-Sterile 4x4 in (DME) (Generic) 2 x Per Day/15 Days Discharge Instructions: Apply over primary dressing as directed. Secondary Dressing: Zetuvit Plus Silicone Border Dressing 5x5 (in/in) (DME) (Generic) 2 x Per Day/15 Days Discharge Instructions: Apply silicone border over primary dressing as directed. Wound #12 - Lower Leg Wound Laterality: Right, Anterior Cleanser: Soap and Water 2 x Per Day/15  Days Discharge Instructions: May shower and wash wound with dial antibacterial soap and water prior to dressing change. Cleanser: Wound Cleanser (DME) (Generic) 2 x Per Day/15 Days Discharge Instructions: Cleanse the wound with wound cleanser prior to applying a clean dressing using gauze sponges, not tissue or cotton balls. Peri-Wound Care: Skin Prep (DME) (Generic) 2 x Per Day/15 Days Discharge Instructions: Use skin prep as directed Prim Dressing: MediHoney Calcium Alginate Dressing 4x5 in 2 x Per Day/15 Days ary Discharge Instructions: Apply to wound bed as instructed Secondary Dressing: Woven Gauze Sponge, Non-Sterile 4x4 in (DME) (Generic) 2 x Per Day/15 Days Discharge Instructions: Apply over primary dressing as directed. Secondary Dressing: Zetuvit Plus Silicone Border Dressing 4x4 (in/in) (DME) (Generic) 2 x Per Day/15 Days Discharge Instructions: Apply silicone border over primary dressing as directed. Secured With: [employer] Size 5, 10 (yds) (DME) (Generic) 2 x Per Day/15 Days Electronic Signature(s) Signed: 04/09/2022 4:17:44 PM By: Geralyn Corwin DO Signed: 04/09/2022 4:24:40 PM By: Fonnie Mu RN Entered By: Fonnie Mu on 04/09/2022 15:54:22 -------------------------------------------------------------------------------- Problem List Details Patient Name: Date of Service: Kelly Milling [name] 04/09/2022 3:15 PM Medical Record Number: [id number] Patient Account Number: [id number] Date of Birth/Sex: Treating RN: 02-Sep-1995 (26 y.o. [name], [name] Primary Care Provider: Dione Booze Other Clinician: Referring Provider: Treating Provider/Extender: Gennaro Africa in Treatment: 60 Active Problems ICD-10 Encounter Code Description Active Date MDM Diagnosis [phone] Non-pressure chronic ulcer of other part of left lower leg with fat layer exposed11/10/2020 No Yes L97.812 Non-pressure chronic ulcer of other part of right lower  leg with fat layer 06/29/2021 No Yes exposed M32.9 Systemic lupus erythematosus, unspecified 02/14/2021 No Yes Inactive Problems ICD-10 Code Description Active Date Inactive Date L97.819 Non-pressure chronic ulcer of other part of right lower leg with unspecified severity 02/13/2021 02/13/2021 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 with unspecified severity 02/13/2021 02/13/2021 B14.782N Unspecified open wound, left lower leg, initial encounter 04/21/2021 04/21/2021 S91.002A Unspecified open wound, left ankle, initial encounter 04/21/2021 04/21/2021 Resolved Problems ICD-10 Code Description Active Date Resolved Date L03.116 Cellulitis of left lower limb 08/24/2021 08/24/2021 Electronic Signature(s) Signed: 04/09/2022  4:17:44 PM By: Geralyn Corwin DO Entered By: Geralyn Corwin on 04/09/2022 15:45:50 -------------------------------------------------------------------------------- Progress Note Details Patient Name: Date of Service: Kelly Milling [name] 04/09/2022 3:15 PM Medical Record Number: [id number] Patient Account Number: [id number] Date of Birth/Sex: Treating RN: Apr 02, 1996 (26 y.o. [name], [name] Primary Care Provider: Dione Booze Other Clinician: Referring Provider: Treating Provider/Extender: Gennaro Africa in Treatment: 60 Subjective Chief Complaint Information obtained from Patient Bilateral lower extremity wounds 04/21/21: left lower extremity wound s/p cellulitis 09/01/21; right lower extremity wound History of Present Illness (HPI) Admission 6/21 [name] with a past medical history of systemic lupus erythematosus that presents with bilateral lower extremity wounds that started in April 2022. She has had skin issues in the past where she developed very small wounds but these healed with time. She has never had open wounds like she does on her legs. She currently reports  minimal drainage to the wounds. She does have pain to these areas but has been overall stable. She denies signs of infection. She has visited the ED for this issue and was recently prescribed doxycycline for possible cellulitis. She follows with [facility] for her SLE and is currently on Plaquenil, prednisone and CellCept. 6/27; patient presents for 1 week follow-up. She states she saw her rheumatologist on 6/22 and prednisone was increased from 10 mg to 20 mg daily. She is currently at the highest dose of Plaquenil and CellCept and is going to try an infusion later today at her regimen. She reports improvement to the wounds. She has been using collagen with dressing changes. She currently denies signs of infection. Readmission 8/26 Patient presents to clinic today for open wounds to her left lower extremity. Her previous wounds that were treated have closed. She states that on 8/9 she developed cellulitis and was treated with 2 rounds of antibiotics. Her cellulitis has resolved. She had blisters that opened and are now large wounds. She has seen her dermatologist and rheumatologist since she has systemic lupus and a hard time healing. No changes have been made to her medications. She was recommended antibiotic ointment by her dermatologist to her wounds She picked up the antibiotic ointment today.. She reports pain to these areas. She denies purulent drainage, increased warmth or erythema to the left lower extremity. She currently keeps the areas covered. Of note she had an Achilles tendon rupture on her right leg and is currently in a soft cast. 9/13; patient presents for follow-up. She missed her last clinic appointment. She has been using Santyl to the wound beds daily. She has no issues or complaints today. She reports the Santyl is helping clean up the wounds. She currently denies signs of infection. 9/22; patient states that 1 week ago she was seen by dermatology and they placed an Unna  boot with Bactroban on her left lower extremity. Today she presents with a Unna boot in place. She currently denies signs of infection. 10/7; patient presents for follow-up. She reports improvement. She has been using Santyl to the areas of nonviable tissue and collagen to granulation tissue. She has been approved for infusions for her lupus. She is going to start these soon. These will be injection she can do at home. She currently denies signs of infection. 10/21; patient presents for follow-up. She reports continued improvement to her wound healing. She has been using Santyl to all areas except for the superior posterior left leg wound and she is using collagen to this.  She has started her injection therapy for lupus. She started 1 week ago. She currently denies signs of infection. 11/3; patient presents for follow-up. She has no issues or complaints today. She is on her third week of injection therapy for lupus and tolerating this well. She has no issues or complaints today. She denies signs of infection. 11/17; patient presents for 2-week follow-up. She reports improvement in wound healing. She denies signs of infection. 12/1; patient presents for follow-up. She has been using collagen to the anterior left lower extremity wounds and Santyl to the left lateral malleolus. She has no issues or complaints today. 12/15; patient presents for follow-up. She is using collagen to the 2 anterior left leg wounds and Santyl to the left lateral malleolus wound. She reports improvement in wound healing. She denies signs of infection. 12/22; patient presents because she is having purulent drainage from her anterior left leg wound. She reports no inciting event and states that the area started draining spontaneously. She was hoping that the drainage would stop however this has not improved. She states this started 5 days ago. She reports tenderness to the wound bed. 12/29; patient presents for follow-up. She  was able to take Augmentin for 4 days but did not tolerate the GI side effects. She continues to take doxycycline. She reports improvement in drainage. She no longer has increased warmth to the surrounding wound bed. She ran out of [place] and has been using Vaseline to the wound beds. 1/6; Patient presents for follow-up. She has been taking Keflex without issues. She reports improvement in drainage and wound healing T the left lower o extremity. Unfortunately she developed another wound spontaneously to the right anterior lower extremity. She denies systemic signs of infection. 1/16; patient presents for follow-up. Patient completed her first prescription of Keflex and has not started the second. She reports no more drainage to the wound beds. She states there was an issue with insurance however she is going to be able to obtain Santyl soon. She has been using Dakin's wet-to-dry dressings to the right anterior leg and left lateral ankle. She has been using collagen to the distal anterior left leg wound and reports that the proximal anterior left leg wound has healed. She denies systemic signs of infection. 1/31; patient presents for follow-up. She reports improvement to the left leg wounds. She reports increased inflammation to the right leg wound. She thinks she is having a reaction to the foam border dressing on the right leg. She is unable to obtain Santyl. She has been using Dakin's wet-to-dry dressings to the right anterior leg and left lateral ankle. She has been using collagen to the distal anterior left leg wound. 2/13; patient presents for follow-up. She reports improvement in her wound healing. One of the wounds to the anterior aspect of the left lower leg has slightly reopened. She denies signs of infection. 2/27; patient presents for follow-up. She has no issues or complaints today. She denies signs of infection. 3/20; patient presents for follow-up. She has been using Medihoney to the  right lower extremity wound and left lateral ankle wound. She continues to use collagen to the left anterior leg wound. She has no issues or complaints today. She denies signs of infection. She reports improvement in wound 4/10; patient has been using Medihoney to the right anterior lower leg wound and silver collagen on the left including the left ankle and left anterior. Fortunately the left anterior lower leg wound is healed. Both of the areas  on the left ankle and the right anterior lower leg appear clean. These wounds are in the setting of chronic lupus. She has had multitude of chronic wounds and a rash on her arms. She tells me she has had both her arms and legs biopsied by dermatology at [facility] and also follows with rheumatology at [facility] although she is not had an appointment this year. She does not have a history of blood clots either arterial or venous. 4/24; patient presents for follow-up. She has been using collagen to the open wound beds. She has developed new areas to her right lower leg. She currently denies signs of infection. 5/9; patient presents for follow-up. She states she is seeing her rheumatologist next week. She has been using Medihoney to all the wound beds except for the lateral left malleolus. She has been using collegen here. She reports improvement to some areas and other areas are flaring due to her lupus. She currently denies signs of infection. 5/23; Patient saw her rheumatologist last week. She has no active lupus activity at this time. Other options for treatment were given. She is going to continue with her current therapy of weekly injections. She reports improvement to Her wound healing on the left ankle. She has been putting Medihoney here. On the right lower extremity she has had a flareup and has 2 open ulcers. She currently denies signs of infection including increased warmth, erythema or purulent drainage. She has been using Medihoney here as well. 7/10;  patient presents for follow-up. She was evaluated in the ED on 6/21 and prescribed Doxycycline For worsening pain to her right lower extremity with increased erythema. She subsequently developed a systemic reaction from the antibiotic and had A diffuse rash. There was concern that her lupus is not well controlled. It was recommended that she start Belimumab. She states she is starting the infusions next week. She has using silver alginate to the wound beds. She currently denies systemic signs of infection. 7/24; patient presents for follow-up. She started her infusion on 7/18 and tolerated this well. She has been using Medihoney to the wound beds. The anterior right leg wound has increased in size. She is currently on Keflex prescribed by her primary care physician. She denies signs of systemic infection. 8/1; patient presents for follow-up. She has been using Medihoney and Dakin's wet-to-dry dressings to the wound beds. She has no issues or complaints today. She denies signs of infection. The posterior right leg wound has healed. 8/14; patient presents for follow-up. She has been using Medihoney to the right lateral leg wound and Dakin's wet-to-dry to the right medial leg wound. She reports improvement in her chronic pain. She is scheduled to have her second infusion this week. She denies signs of infection. Patient History Information obtained from Patient. Family History Diabetes - Paternal Grandparents, Hypertension - Paternal Grandparents, Thyroid Problems - Maternal Grandparents,Paternal Grandparents, No family history of Cancer, Heart Disease, Hereditary Spherocytosis, Kidney Disease, Lung Disease, Seizures, Stroke, Tuberculosis. Social History Never smoker, Marital Status - Married, Alcohol Use - Moderate, Drug Use - Current History - Marijuana, Caffeine Use - Daily - Coffee. Medical History Respiratory Patient has history of Asthma Immunological Patient has history of Lupus  Erythematosus Objective Constitutional respirations regular, non-labored and within target range for patient.. Vitals Time Taken: 3:28 PM, Height: 62 in, Temperature: 98.6 F, Pulse: 102 bpm, Respiratory Rate: 20 breaths/min, Blood Pressure: 110/78 mmHg. Cardiovascular 2+ dorsalis pedis/posterior tibialis pulses. Psychiatric pleasant and cooperative. General Notes: Right lower extremity: 2  open wounds. The 1 to the lateral aspect has nonviable surface throughout and calcium deposits. The medial wound has granulation tissue and slough. No surrounding soft tissue infection. Integumentary (Hair, Skin) Wound #10 status is Open. Original cause of wound was Gradually Appeared. The date acquired was: 12/18/2021. The wound has been in treatment 16 weeks. The wound is located on the Right,Medial Lower Leg. The wound measures 3.4cm length x 3.1cm width x 0.7cm depth; 8.278cm^2 area and 5.795cm^3 volume. There is Fat Layer (Subcutaneous Tissue) exposed. There is no tunneling noted, however, there is undermining starting at 7:00 and ending at 12:00 with a maximum distance of 1.5cm. There is a medium amount of serosanguineous drainage noted. The wound margin is well defined and not attached to the wound base. There is large (67-100%) red, pink granulation within the wound bed. There is a small (1-33%) amount of necrotic tissue within the wound bed including Adherent Slough. Wound #12 status is Open. Original cause of wound was Gradually Appeared. The date acquired was: 03/19/2022. The wound has been in treatment 3 weeks. The wound is located on the Right,Anterior Lower Leg. The wound measures 0.6cm length x 0.7cm width x 0.3cm depth; 0.33cm^2 area and 0.099cm^3 volume. There is Fat Layer (Subcutaneous Tissue) exposed. There is no tunneling or undermining noted. There is a medium amount of serosanguineous drainage noted. The wound margin is epibole. There is small (1-33%) red granulation within the wound bed.  There is a large (67-100%) amount of necrotic tissue within the wound bed including Adherent Slough. Assessment Active Problems ICD-10 Non-pressure chronic ulcer of other part of left lower leg with fat layer exposed Non-pressure chronic ulcer of other part of right lower leg with fat layer exposed Systemic lupus erythematosus, unspecified Patient's wounds have shown improvement in size and appearance since last clinic visit. I debrided nonviable tissue. I recommended continuing the course with Medihoney to the lateral leg wound and Dakin's wet-to-dry to the medial leg wound. No signs of surrounding infection. Follow-up in 2 weeks. Procedures Wound #10 Pre-procedure diagnosis of Wound #10 is a Lupus located on the Right,Medial Lower Leg . There was a Excisional Skin/Subcutaneous Tissue Debridement with a total area of 10.54 sq cm performed by Geralyn Corwin, DO. With the following instrument(s): Curette to remove Viable and Non-Viable tissue/material. Material removed includes Subcutaneous Tissue and Slough and after achieving pain control using Lidocaine. No specimens were taken. A time out was conducted at 15:40, prior to the start of the procedure. A Minimum amount of bleeding was controlled with Pressure. The procedure was tolerated well with a pain level of 0 throughout and a pain level of 0 following the procedure. Post Debridement Measurements: 3.4cm length x 3.1cm width x 0.7cm depth; 5.795cm^3 volume. Character of Wound/Ulcer Post Debridement is improved. Post procedure Diagnosis Wound #10: Same as Pre-Procedure Wound #12 Pre-procedure diagnosis of Wound #12 is an Auto-immune located on the Right,Anterior Lower Leg . There was a Selective/Open Wound Non-Viable Tissue Debridement with a total area of 0.42 sq cm performed by Geralyn Corwin, DO. With the following instrument(s): Curette to remove Viable and Non-Viable tissue/material. Material removed includes Slough after achieving  pain control using Lidocaine. No specimens were taken. A time out was conducted at 15:40, prior to the start of the procedure. A Minimum amount of bleeding was controlled with Pressure. The procedure was tolerated well with a pain level of 0 throughout and a pain level of 0 following the procedure. Post Debridement Measurements: 0.6cm length x 0.7cm  width x 0.3cm depth; 0.099cm^3 volume. Character of Wound/Ulcer Post Debridement is improved. Post procedure Diagnosis Wound #12: Same as Pre-Procedure Plan Follow-up Appointments: Return Appointment in 2 weeks. - 04/23/22 @ 1515 W/ dR. Maverick Dieudonne and LAURAN rM # 9 Other: - taking two Lovenox 60mg  injections daily Bathing/ Shower/ Hygiene: May shower and wash wound with soap and water. Edema Control - Lymphedema / SCD / Other: Elevate legs to the level of the heart or above for 30 minutes daily and/or when sitting, a frequency of: - 3-4 times a day throughout the day. Avoid standing for long periods of time. Additional Orders / Instructions: Follow Nutritious Diet WOUND #10: - Lower Leg Wound Laterality: Right, Medial Cleanser: Soap and Water 2 x Per Day/15 Days Discharge Instructions: May shower and wash wound with dial antibacterial soap and water prior to dressing change. Cleanser: Wound Cleanser (Generic) 2 x Per Day/15 Days Discharge Instructions: Cleanse the wound with wound cleanser prior to applying a clean dressing using gauze sponges, not tissue or cotton balls. Peri-Wound Care: Skin Prep (Generic) 2 x Per Day/15 Days Discharge Instructions: Use skin prep as directed Prim Dressing: Dakin's Solution 0.25%, 16 (oz) 2 x Per Day/15 Days ary Discharge Instructions: Moisten gauze with Dakin's solution Secondary Dressing: Woven Gauze Sponge, Non-Sterile 4x4 in (Generic) 2 x Per Day/15 Days Discharge Instructions: Apply over primary dressing as directed. Secondary Dressing: Zetuvit Plus Silicone Border Dressing 7x7(in/in) (Generic) 2 x Per  Day/15 Days Discharge Instructions: Apply silicone border over primary dressing as directed. WOUND #12: - Lower Leg Wound Laterality: Right, Anterior Cleanser: Soap and Water 2 x Per Day/15 Days Discharge Instructions: May shower and wash wound with dial antibacterial soap and water prior to dressing change. Cleanser: Wound Cleanser (Generic) 2 x Per Day/15 Days Discharge Instructions: Cleanse the wound with wound cleanser prior to applying a clean dressing using gauze sponges, not tissue or cotton balls. Peri-Wound Care: Skin Prep (Generic) 2 x Per Day/15 Days Discharge Instructions: Use skin prep as directed Prim Dressing: MediHoney Calcium Alginate Dressing 4x5 in 2 x Per Day/15 Days ary Discharge Instructions: Apply to wound bed as instructed Secondary Dressing: ALLEVYN Gentle Border, 3x3 (in/in) (Generic) 2 x Per Day/15 Days Discharge Instructions: Apply over primary dressing as directed. Secondary Dressing: Woven Gauze Sponge, Non-Sterile 4x4 in (Generic) 2 x Per Day/15 Days Discharge Instructions: Apply over primary dressing as directed. 1. In office sharp debridement 2. Medihoney 3. Dakin's wet-to-dry 4. Follow-up in 2 weeks Electronic Signature(s) Signed: 04/09/2022 4:17:44 PM By: 04/11/2022 DO Entered By: Geralyn Corwin on 04/09/2022 15:53:57 -------------------------------------------------------------------------------- HxROS Details Patient Name: Date of Service: 04/11/2022 [name] 04/09/2022 3:15 PM Medical Record Number: 04/11/2022 Patient Account Number: [id number] Date of Birth/Sex: Treating RN: 01-14-1996 (26 y.o. 34, [name] Primary Care Provider: [name] Other Clinician: Referring Provider: Treating Provider/Extender: Dione Booze in Treatment: 60 Information Obtained From Patient Respiratory Medical History: Positive for: Asthma Immunological Medical History: Positive for: Lupus  Erythematosus Immunizations Pneumococcal Vaccine: Received Pneumococcal Vaccination: No Implantable Devices None Family and Social History Cancer: No; Diabetes: Yes - Paternal Grandparents; Heart Disease: No; Hereditary Spherocytosis: No; Hypertension: Yes - Paternal Grandparents; Kidney Disease: No; Lung Disease: No; Seizures: No; Stroke: No; Thyroid Problems: Yes - Maternal Grandparents,Paternal Grandparents; Tuberculosis: No; Never smoker; Marital Status - Married; Alcohol Use: Moderate; Drug Use: Current History - Marijuana; Caffeine Use: Daily - Coffee; Financial Concerns: No; Food, Clothing or Shelter Needs: No; Support System Lacking: No Gennaro [place]) Signed:  04/09/2022 4:17:44 PM By: Geralyn Corwin DO Signed: 04/09/2022 4:24:40 PM By: Fonnie Mu RN Entered By: Geralyn Corwin on 04/09/2022 15:49:46 -------------------------------------------------------------------------------- SuperBill Details Patient Name: Date of Service: Kelly Milling [name] 04/09/2022 Medical Record Number: [id number] Patient Account Number: [id number] Date of Birth/Sex: Treating RN: Dec 11, 1995 (26 y.o. [name], [name] Primary Care Provider: Dione Booze Other Clinician: Referring Provider: Treating Provider/Extender: Gennaro Africa in Treatment: 60 Diagnosis Coding ICD-10 Codes Code Description [phone] Non-pressure chronic ulcer of other part of left lower leg with fat layer exposed L97.812 Non-pressure chronic ulcer of other part of right lower leg with fat layer exposed M32.9 Systemic lupus erythematosus, unspecified Facility Procedures CPT4 Code: 01093235 Description: 11042 - DEB SUBQ TISSUE 20 SQ CM/< ICD-10 Diagnosis Description L97.812 Non-pressure chronic ulcer of other part of right lower leg with fat layer expos Modifier: ed Quantity: 1 CPT4 Code: 57322025 Description: 97597 - DEBRIDE WOUND 1ST 20 SQ CM OR < ICD-10 Diagnosis  Description L97.812 Non-pressure chronic ulcer of other part of right lower leg with fat layer expos Modifier: ed Quantity: 1 Physician Procedures : CPT4 Code Description Modifier [id number] 11042 - WC PHYS SUBQ TISS 20 SQ CM ICD-10 Diagnosis Description L97.812 Non-pressure chronic ulcer of other part of right lower leg with fat layer exposed Quantity: 1 : [id number] 97597 - WC PHYS DEBR WO ANESTH 20 SQ CM ICD-10 Diagnosis Description L97.812 Non-pressure chronic ulcer of other part of right lower leg with fat layer exposed Quantity: 1 Electronic Signature(s) Signed: 04/09/2022 4:17:44 PM By: Geralyn Corwin DO Signed: 04/09/2022 4:24:40 PM By: Fonnie Mu RN Entered By: Fonnie Mu on 04/09/2022 15:54:51

## 2022-04-23 ENCOUNTER — Inpatient Hospital Stay (HOSPITAL_COMMUNITY)
Admission: EM | Admit: 2022-04-23 | Discharge: 2022-04-26 | DRG: 602 | Disposition: A | Discharge status: Home / Self Care | Payer: Managed Care, Other (non HMO) | Attending: Family Medicine | Admitting: Family Medicine

## 2022-04-23 ENCOUNTER — Other Ambulatory Visit: Payer: Self-pay

## 2022-04-23 ENCOUNTER — Emergency Department (HOSPITAL_COMMUNITY): Payer: Managed Care, Other (non HMO)

## 2022-04-23 ENCOUNTER — Encounter (HOSPITAL_COMMUNITY): Payer: Self-pay

## 2022-04-23 ENCOUNTER — Encounter (HOSPITAL_BASED_OUTPATIENT_CLINIC_OR_DEPARTMENT_OTHER): Payer: Managed Care, Other (non HMO) | Admitting: Internal Medicine

## 2022-04-23 DIAGNOSIS — B952 Enterococcus as the cause of diseases classified elsewhere: Secondary | ICD-10-CM | POA: Diagnosis present

## 2022-04-23 DIAGNOSIS — L97812 Non-pressure chronic ulcer of other part of right lower leg with fat layer exposed: Secondary | ICD-10-CM | POA: Diagnosis not present

## 2022-04-23 DIAGNOSIS — Z7952 Long term (current) use of systemic steroids: Secondary | ICD-10-CM

## 2022-04-23 DIAGNOSIS — L02415 Cutaneous abscess of right lower limb: Principal | ICD-10-CM | POA: Diagnosis present

## 2022-04-23 DIAGNOSIS — L0291 Cutaneous abscess, unspecified: Secondary | ICD-10-CM | POA: Diagnosis not present

## 2022-04-23 DIAGNOSIS — L97822 Non-pressure chronic ulcer of other part of left lower leg with fat layer exposed: Secondary | ICD-10-CM | POA: Diagnosis not present

## 2022-04-23 DIAGNOSIS — L97919 Non-pressure chronic ulcer of unspecified part of right lower leg with unspecified severity: Secondary | ICD-10-CM | POA: Diagnosis present

## 2022-04-23 DIAGNOSIS — O99891 Other specified diseases and conditions complicating pregnancy: Secondary | ICD-10-CM | POA: Diagnosis present

## 2022-04-23 DIAGNOSIS — Z7901 Long term (current) use of anticoagulants: Secondary | ICD-10-CM

## 2022-04-23 DIAGNOSIS — L089 Local infection of the skin and subcutaneous tissue, unspecified: Principal | ICD-10-CM

## 2022-04-23 DIAGNOSIS — Z881 Allergy status to other antibiotic agents status: Secondary | ICD-10-CM

## 2022-04-23 DIAGNOSIS — L03115 Cellulitis of right lower limb: Secondary | ICD-10-CM | POA: Diagnosis present

## 2022-04-23 DIAGNOSIS — B9620 Unspecified Escherichia coli [E. coli] as the cause of diseases classified elsewhere: Secondary | ICD-10-CM | POA: Diagnosis present

## 2022-04-23 DIAGNOSIS — M329 Systemic lupus erythematosus, unspecified: Secondary | ICD-10-CM | POA: Diagnosis not present

## 2022-04-23 DIAGNOSIS — Z87891 Personal history of nicotine dependence: Secondary | ICD-10-CM

## 2022-04-23 DIAGNOSIS — Z79899 Other long term (current) drug therapy: Secondary | ICD-10-CM

## 2022-04-23 DIAGNOSIS — B9689 Other specified bacterial agents as the cause of diseases classified elsewhere: Secondary | ICD-10-CM | POA: Diagnosis present

## 2022-04-23 DIAGNOSIS — U071 COVID-19: Secondary | ICD-10-CM

## 2022-04-23 DIAGNOSIS — D649 Anemia, unspecified: Secondary | ICD-10-CM | POA: Diagnosis present

## 2022-04-23 DIAGNOSIS — F419 Anxiety disorder, unspecified: Secondary | ICD-10-CM | POA: Diagnosis present

## 2022-04-23 DIAGNOSIS — L02419 Cutaneous abscess of limb, unspecified: Secondary | ICD-10-CM | POA: Diagnosis present

## 2022-04-23 LAB — CBC WITH DIFFERENTIAL/PLATELET
Abs Immature Granulocytes: 0.04 10*3/uL (ref 0.00–0.07)
Basophils Absolute: 0 10*3/uL (ref 0.0–0.1)
Basophils Relative: 0 %
Eosinophils Absolute: 0.1 10*3/uL (ref 0.0–0.5)
Eosinophils Relative: 1 %
HCT: 35.2 % — ABNORMAL LOW (ref 36.0–46.0)
Hemoglobin: 11.7 g/dL — ABNORMAL LOW (ref 12.0–15.0)
Immature Granulocytes: 0 %
Lymphocytes Relative: 16 %
Lymphs Abs: 1.6 10*3/uL (ref 0.7–4.0)
MCH: 31 pg (ref 26.0–34.0)
MCHC: 33.2 g/dL (ref 30.0–36.0)
MCV: 93.1 fL (ref 80.0–100.0)
Monocytes Absolute: 0.9 10*3/uL (ref 0.1–1.0)
Monocytes Relative: 9 %
Neutro Abs: 7.5 10*3/uL (ref 1.7–7.7)
Neutrophils Relative %: 74 %
Platelets: 302 10*3/uL (ref 150–400)
RBC: 3.78 MIL/uL — ABNORMAL LOW (ref 3.87–5.11)
RDW: 13.9 % (ref 11.5–15.5)
WBC: 10.1 10*3/uL (ref 4.0–10.5)
nRBC: 0 % (ref 0.0–0.2)

## 2022-04-23 LAB — COMPREHENSIVE METABOLIC PANEL
ALT: 29 U/L (ref 0–44)
AST: 32 U/L (ref 15–41)
Albumin: 3.1 g/dL — ABNORMAL LOW (ref 3.5–5.0)
Alkaline Phosphatase: 51 U/L (ref 38–126)
Anion gap: 7 (ref 5–15)
BUN: 9 mg/dL (ref 6–20)
CO2: 23 mmol/L (ref 22–32)
Calcium: 9 mg/dL (ref 8.9–10.3)
Chloride: 106 mmol/L (ref 98–111)
Creatinine, Ser: 0.76 mg/dL (ref 0.44–1.00)
GFR, Estimated: >60 mL/min (ref >60)
Glucose, Bld: 102 mg/dL — ABNORMAL HIGH (ref 70–99)
Potassium: 4.4 mmol/L (ref 3.5–5.1)
Sodium: 136 mmol/L (ref 135–145)
Total Bilirubin: 0.7 mg/dL (ref 0.3–1.2)
Total Protein: 8.4 g/dL — ABNORMAL HIGH (ref 6.5–8.1)

## 2022-04-23 LAB — LACTIC ACID, PLASMA: Lactic Acid, Venous: 0.9 mmol/L (ref 0.5–1.9)

## 2022-04-23 MED ORDER — HYDROCODONE-ACETAMINOPHEN 5-325 MG PO TABS
1.5000 | ORAL_TABLET | Freq: Once | ORAL | Status: DC
  Filled 2022-04-23 (×2): qty 2

## 2022-04-23 MED ORDER — HYDROCODONE-ACETAMINOPHEN 7.5-325 MG PO TABS: 1.5000 | ORAL_TABLET | Freq: Once | ORAL | Status: DC

## 2022-04-23 MED ORDER — HYDROCODONE-ACETAMINOPHEN 7.5-325 MG PO TABS
1.0000 | ORAL_TABLET | Freq: Once | ORAL | Status: DC
  Administered 2022-04-23: 1 via ORAL
  Filled 2022-04-23: qty 1

## 2022-04-23 NOTE — ED Provider Triage Note (Signed)
Emergency Medicine Provider Triage Evaluation Note  Kelly [name] , a 26 y.o. female  was evaluated in triage.  Pt complains of wound infection to her right lower leg. She has lupus and is followed by wound care. They noted that the patient needed to be evaluated for worsening infection to her lower leg and possibly IV antibiotics. Denies fever. Notes drainage.   Review of Systems  Positive:  Negative:   Physical Exam  BP (!) 121/90 (BP Location: Right Arm)   Pulse 98   Temp 99.2 F (37.3 C) (Oral)   Resp 16   Ht 5\' 3"  (1.6 m)   Wt 62.1 kg   LMP 02/05/2022   SpO2 100%   BMI 24.27 kg/m  Gen:   Awake, no distress   Resp:  Normal effort  MSK:   Moves extremities without difficulty  Other:  Wound dressed in triage  Medical Decision Making  Medically screening exam initiated at 7:40 PM.  Appropriate orders placed.  Kelly [name] was informed that the remainder of the evaluation will be completed by another provider, this initial triage assessment does not replace that evaluation, and the importance of remaining in the ED until their evaluation is complete.     Kelly [name] A, PA-C 04/23/22 1941

## 2022-04-23 NOTE — ED Triage Notes (Signed)
Patient has a hx of lupus and has been developing ulcers.  Patient has wound infection to right leg x 4 days.

## 2022-04-24 ENCOUNTER — Emergency Department (HOSPITAL_COMMUNITY): Payer: Managed Care, Other (non HMO)

## 2022-04-24 DIAGNOSIS — B9689 Other specified bacterial agents as the cause of diseases classified elsewhere: Secondary | ICD-10-CM | POA: Diagnosis present

## 2022-04-24 DIAGNOSIS — M329 Systemic lupus erythematosus, unspecified: Secondary | ICD-10-CM

## 2022-04-24 DIAGNOSIS — U071 COVID-19: Secondary | ICD-10-CM | POA: Diagnosis present

## 2022-04-24 DIAGNOSIS — Z79899 Other long term (current) drug therapy: Secondary | ICD-10-CM | POA: Diagnosis not present

## 2022-04-24 DIAGNOSIS — L03119 Cellulitis of unspecified part of limb: Secondary | ICD-10-CM | POA: Diagnosis not present

## 2022-04-24 DIAGNOSIS — L02419 Cutaneous abscess of limb, unspecified: Secondary | ICD-10-CM | POA: Diagnosis not present

## 2022-04-24 DIAGNOSIS — F419 Anxiety disorder, unspecified: Secondary | ICD-10-CM | POA: Diagnosis present

## 2022-04-24 DIAGNOSIS — D649 Anemia, unspecified: Secondary | ICD-10-CM | POA: Diagnosis present

## 2022-04-24 DIAGNOSIS — Z7901 Long term (current) use of anticoagulants: Secondary | ICD-10-CM

## 2022-04-24 DIAGNOSIS — L03115 Cellulitis of right lower limb: Secondary | ICD-10-CM | POA: Diagnosis present

## 2022-04-24 DIAGNOSIS — Z7952 Long term (current) use of systemic steroids: Secondary | ICD-10-CM | POA: Diagnosis not present

## 2022-04-24 DIAGNOSIS — T148XXA Other injury of unspecified body region, initial encounter: Secondary | ICD-10-CM | POA: Diagnosis not present

## 2022-04-24 DIAGNOSIS — L0291 Cutaneous abscess, unspecified: Secondary | ICD-10-CM | POA: Diagnosis present

## 2022-04-24 DIAGNOSIS — B9620 Unspecified Escherichia coli [E. coli] as the cause of diseases classified elsewhere: Secondary | ICD-10-CM | POA: Diagnosis present

## 2022-04-24 DIAGNOSIS — L02415 Cutaneous abscess of right lower limb: Secondary | ICD-10-CM | POA: Diagnosis present

## 2022-04-24 DIAGNOSIS — Z881 Allergy status to other antibiotic agents status: Secondary | ICD-10-CM | POA: Diagnosis not present

## 2022-04-24 DIAGNOSIS — B952 Enterococcus as the cause of diseases classified elsewhere: Secondary | ICD-10-CM | POA: Diagnosis present

## 2022-04-24 DIAGNOSIS — Z87891 Personal history of nicotine dependence: Secondary | ICD-10-CM | POA: Diagnosis not present

## 2022-04-24 DIAGNOSIS — L97919 Non-pressure chronic ulcer of unspecified part of right lower leg with unspecified severity: Secondary | ICD-10-CM | POA: Diagnosis present

## 2022-04-24 HISTORY — DX: Cellulitis of unspecified part of limb: L02.419

## 2022-04-24 LAB — BASIC METABOLIC PANEL
Anion gap: 10 (ref 5–15)
BUN: 8 mg/dL (ref 6–20)
CO2: 20 mmol/L — ABNORMAL LOW (ref 22–32)
Calcium: 8.8 mg/dL — ABNORMAL LOW (ref 8.9–10.3)
Chloride: 110 mmol/L (ref 98–111)
Creatinine, Ser: 0.73 mg/dL (ref 0.44–1.00)
GFR, Estimated: >60 mL/min (ref >60)
Glucose, Bld: 69 mg/dL — ABNORMAL LOW (ref 70–99)
Potassium: 4.4 mmol/L (ref 3.5–5.1)
Sodium: 140 mmol/L (ref 135–145)

## 2022-04-24 LAB — CBC WITH DIFFERENTIAL/PLATELET
Abs Immature Granulocytes: 0.04 10*3/uL (ref 0.00–0.07)
Basophils Absolute: 0 10*3/uL (ref 0.0–0.1)
Basophils Relative: 0 %
Eosinophils Absolute: 0.1 10*3/uL (ref 0.0–0.5)
Eosinophils Relative: 2 %
HCT: 37 % (ref 36.0–46.0)
Hemoglobin: 11.6 g/dL — ABNORMAL LOW (ref 12.0–15.0)
Immature Granulocytes: 1 %
Lymphocytes Relative: 18 %
Lymphs Abs: 1.5 10*3/uL (ref 0.7–4.0)
MCH: 30.5 pg (ref 26.0–34.0)
MCHC: 31.4 g/dL (ref 30.0–36.0)
MCV: 97.4 fL (ref 80.0–100.0)
Monocytes Absolute: 1.3 10*3/uL — ABNORMAL HIGH (ref 0.1–1.0)
Monocytes Relative: 15 %
Neutro Abs: 5.5 10*3/uL (ref 1.7–7.7)
Neutrophils Relative %: 64 %
Platelets: 243 10*3/uL (ref 150–400)
RBC: 3.8 MIL/uL — ABNORMAL LOW (ref 3.87–5.11)
RDW: 14.1 % (ref 11.5–15.5)
WBC: 8.5 10*3/uL (ref 4.0–10.5)
nRBC: 0 % (ref 0.0–0.2)

## 2022-04-24 LAB — C-REACTIVE PROTEIN: CRP: 8.1 mg/dL — ABNORMAL HIGH (ref <1.0)

## 2022-04-24 LAB — HEPARIN LEVEL (UNFRACTIONATED)
Heparin Unfractionated: 0.11 IU/mL — ABNORMAL LOW (ref 0.30–0.70)
Heparin Unfractionated: 0.19 IU/mL — ABNORMAL LOW (ref 0.30–0.70)

## 2022-04-24 LAB — SEDIMENTATION RATE: Sed Rate: 115 mm/hr — ABNORMAL HIGH (ref 0–22)

## 2022-04-24 LAB — SARS CORONAVIRUS 2 BY RT PCR: SARS Coronavirus 2 by RT PCR: POSITIVE — AB

## 2022-04-24 MED ORDER — MORPHINE SULFATE (PF) 4 MG/ML IV SOLN
4.0000 mg | Freq: Once | INTRAVENOUS | Status: AC
  Administered 2022-04-24: 4 mg via INTRAVENOUS
  Filled 2022-04-24: qty 1

## 2022-04-24 MED ORDER — HYDROCODONE-ACETAMINOPHEN 5-325 MG PO TABS
1.0000 | ORAL_TABLET | ORAL | Status: DC | PRN
  Administered 2022-04-24: 1 via ORAL
  Filled 2022-04-24: qty 1

## 2022-04-24 MED ORDER — ONDANSETRON HCL 4 MG/2ML IJ SOLN
4.0000 mg | Freq: Once | INTRAMUSCULAR | Status: AC
  Administered 2022-04-24: 4 mg via INTRAVENOUS
  Filled 2022-04-24: qty 2

## 2022-04-24 MED ORDER — ONDANSETRON HCL 4 MG PO TABS: 4.0000 mg | ORAL_TABLET | Freq: Four times a day (QID) | ORAL | Status: DC | PRN

## 2022-04-24 MED ORDER — PREDNISONE 5 MG PO TABS: 5.0000 mg | ORAL_TABLET | ORAL | Status: DC

## 2022-04-24 MED ORDER — ACETAMINOPHEN 650 MG RE SUPP: 650.0000 mg | Freq: Four times a day (QID) | RECTAL | Status: DC | PRN

## 2022-04-24 MED ORDER — HYDROCODONE-ACETAMINOPHEN 5-325 MG PO TABS
1.0000 | ORAL_TABLET | ORAL | Status: DC | PRN
  Administered 2022-04-24 – 2022-04-25 (×2): 1 via ORAL
  Administered 2022-04-25: 1.5 via ORAL
  Administered 2022-04-25 – 2022-04-26 (×3): 2 via ORAL
  Filled 2022-04-24 (×2): qty 1
  Filled 2022-04-24 (×3): qty 2

## 2022-04-24 MED ORDER — HYDROXYCHLOROQUINE SULFATE 200 MG PO TABS
400.0000 mg | ORAL_TABLET | Freq: Every day | ORAL | Status: DC
  Administered 2022-04-24 – 2022-04-26 (×3): 400 mg via ORAL
  Filled 2022-04-24 (×3): qty 2

## 2022-04-24 MED ORDER — SODIUM CHLORIDE 0.9% FLUSH
3.0000 mL | Freq: Two times a day (BID) | INTRAVENOUS | Status: DC
  Administered 2022-04-25 – 2022-04-26 (×4): 3 mL via INTRAVENOUS

## 2022-04-24 MED ORDER — ENOXAPARIN SODIUM 60 MG/0.6ML IJ SOSY
60.0000 mg | PREFILLED_SYRINGE | Freq: Two times a day (BID) | INTRAMUSCULAR | Status: DC
  Filled 2022-04-24: qty 0.6

## 2022-04-24 MED ORDER — PREDNISONE 10 MG PO TABS
10.0000 mg | ORAL_TABLET | Freq: Every day | ORAL | Status: DC
  Administered 2022-04-24 – 2022-04-26 (×3): 10 mg via ORAL
  Filled 2022-04-24 (×3): qty 1

## 2022-04-24 MED ORDER — MORPHINE SULFATE (PF) 4 MG/ML IV SOLN
4.0000 mg | INTRAVENOUS | Status: DC | PRN
  Administered 2022-04-24 – 2022-04-26 (×6): 4 mg via INTRAVENOUS
  Filled 2022-04-24 (×6): qty 1

## 2022-04-24 MED ORDER — HEPARIN (PORCINE) 25000 UT/250ML-% IV SOLN
1250.0000 [IU]/h | INTRAVENOUS | Status: DC
  Administered 2022-04-24: 950 [IU]/h via INTRAVENOUS
  Administered 2022-04-25 – 2022-04-26 (×2): 1250 [IU]/h via INTRAVENOUS
  Filled 2022-04-24 (×3): qty 250

## 2022-04-24 MED ORDER — VANCOMYCIN HCL IN DEXTROSE 1-5 GM/200ML-% IV SOLN
1000.0000 mg | Freq: Once | INTRAVENOUS | Status: AC
  Administered 2022-04-24: 1000 mg via INTRAVENOUS
  Filled 2022-04-24: qty 200

## 2022-04-24 MED ORDER — VANCOMYCIN HCL IN DEXTROSE 1-5 GM/200ML-% IV SOLN
1000.0000 mg | Freq: Two times a day (BID) | INTRAVENOUS | Status: DC
  Administered 2022-04-24 – 2022-04-26 (×4): 1000 mg via INTRAVENOUS
  Filled 2022-04-24 (×6): qty 200

## 2022-04-24 MED ORDER — ACETAMINOPHEN 325 MG PO TABS: 650.0000 mg | ORAL_TABLET | Freq: Four times a day (QID) | ORAL | Status: DC | PRN

## 2022-04-24 MED ORDER — AZATHIOPRINE 50 MG PO TABS
50.0000 mg | ORAL_TABLET | Freq: Every day | ORAL | Status: DC
  Administered 2022-04-24 – 2022-04-26 (×3): 50 mg via ORAL
  Filled 2022-04-24 (×3): qty 1

## 2022-04-24 MED ORDER — IOHEXOL 300 MG/ML  SOLN
100.0000 mL | Freq: Once | INTRAMUSCULAR | Status: AC | PRN
  Administered 2022-04-24: 100 mL via INTRAVENOUS

## 2022-04-24 MED ORDER — ONDANSETRON HCL 4 MG/2ML IJ SOLN: 4.0000 mg | Freq: Four times a day (QID) | INTRAMUSCULAR | Status: DC | PRN

## 2022-04-24 NOTE — Progress Notes (Signed)
ANTICOAGULATION CONSULT NOTE - Follow Up Consult  Pharmacy Consult for heparin  Indication:  patient has a history of Lupus and thrombotic lesions   Allergies  Allergen Reactions   Doxycycline Hives and Rash    Blister patches on skin    Patient Measurements: Height: 5\' 3"  (160 cm) Weight: 62.1 kg (137 lb) IBW/kg (Calculated) : 52.4 Heparin Dosing Weight: 62.1kg   Vital Signs: Temp: 99 F (37.2 C) (08/29 1046) Temp Source: Oral (08/29 1046) BP: 105/74 (08/29 1046) Pulse Rate: 109 (08/29 1046)  Labs: Recent Labs    04/23/22 1958  HGB 11.7*  HCT 35.2*  PLT 302  CREATININE 0.76    Estimated Creatinine Clearance: 87.4 mL/min (by C-G formula based on SCr of 0.76 mg/dL).  Assessment: Patient is currently admitted for lower leg wound infection with potential I & D. Patient is maintained on Lovenox 60mg  BID at home for treatment of thrombotic lesions secondary to Lupus.Last dose of enoxaparin on 8/28. Due to likely upcoming procedure switched to heparin for management. Patient's CBC and renal function are stable.   Goal of Therapy:  Heparin level 0.3-0.7 units/ml Monitor platelets by anticoagulation protocol: Yes   Plan:  Will not bolus due to recent treatment dose Lovenox and increased risk of bleed due to likely upcoming procedure.  Start heparin infusion at 950 units/hr Check anti-Xa level in 6 hours and daily while on heparin Continue to monitor H&H and platelets  04/24/2022,12:18 PM

## 2022-04-24 NOTE — Consult Note (Signed)
WOC consulted for RLE, patient has been evaluated by orthopedics and Dr. Lajoyce Corners will see patient later today.   Will not consult for that reason  Sydny Schnitzler [facility], CNS, CWON-AP [phone]

## 2022-04-24 NOTE — H&P (Addendum)
History and Physical    Patient: Kelly [name] SEG:[id number] DOB: [birth year] DOA: 04/23/2022 DOS: the patient was seen and examined on 04/24/2022 PCP: Dione Booze, MD  Patient coming from: Home  Chief Complaint:  Chief Complaint  Patient presents with   Wound Infection   HPI: Kelly [name] is a 26 y.o. female with medical history significant of systemic lupus(on hydroxychloroquine, prednisone and Imuran) with chronic ulcers who presented after recent wound check.  She has been being followed by wound care due to chronic ulcers related to her lupus.  She was seen yesterday and they were unable to dress her wound on the right lateral aspect due to it being raised and warm to the touch.  At home patient reported that she has been having hot flashes, chills, and recently developed a tickle in her throat.  Her mother-in-law had recent he tested positive for COVID and there was outbreak in her son's school for which she is concerned.  On admission into the emergency department was noted to have temperature of 99.4 F with tachypnea, and all other vital signs maintained. Labs from 8/28 noted hemoglobin 11.7 with initial lactic acid 0.9.  Blood cultures were obtained.  X-rays of the tibia fibula found no focal lytic lesions.  Wound cultures was positive for moderate gram-positive cocci in pairs and few gram negative rods.  CT scan of the right lower extremity noted a rim-enhancing collection in the air superior to the lesion measuring 1.2 x 0.5 x 3.1 cm suspicious for abscess with signs of reactive or early osteomyelitis.     Review of Systems: As mentioned in the history of present illness. All other systems reviewed and are negative. Past Medical History:  Diagnosis Date   Anxiety    Chest discomfort feb 2017   Pt states due to shortness of breath   Cough present for greater than 3 weeks    Late Entry to [facility]- April 2020- Social Distancing  11/27/2018   Pt signed up for  [facility]. Will come to office to pick up BP cuff.    Lupus (systemic lupus erythematosus) (HCC)    dx 2010   Pneumonia    2015   Shortness of breath dyspnea feb 2017   changed inhaler with relief   Past Surgical History:  Procedure Laterality Date   BRONCHOSCOPY  11/2013   CESAREAN SECTION N/A 03/16/2019   Procedure: CESAREAN SECTION;  Surgeon: Levie Heritage, DO;  Location: [facility];  Service: Obstetrics;  Laterality: N/A;   WISDOM TOOTH EXTRACTION  2014   Social History:  reports that she has quit smoking. Her smoking use included cigarettes. She has never used smokeless tobacco. She reports that she does not currently use alcohol. She reports that she does not currently use drugs after having used the following drugs: Marijuana.  Allergies  Allergen Reactions   Doxycycline Hives and Rash    Blister patches on skin    Family History  Problem Relation Age of Onset   Diabetes Paternal Grandmother     Prior to Admission medications   Medication Sig Start Date End Date Taking? Authorizing Provider  acetaminophen (TYLENOL) 500 MG tablet Take 1,000 mg by mouth every 6 (six) hours as needed for moderate pain or headache.    [provider]  albuterol (VENTOLIN HFA) 108 (90 Base) MCG/ACT inhaler Inhale 2 puffs into the lungs every 6 (six) hours as needed for wheezing or shortness of breath. 01/12/22   [provider]  azaTHIOprine (IMURAN) 50 MG tablet Take 50 mg by mouth daily. 01/30/22   [provider]  Calcium Carb-Cholecalciferol (CALCIUM 600-D) 600-10 MG-MCG TABS 1 tablet with a meal Orally Once a day for 30 day(s)    [provider]  Control Gel Formula Dressing (DUODERM CGF EXTRA THIN EX) Apply topically.    [provider]  enoxaparin (LOVENOX) 60 MG/0.6ML injection Inject into the skin. 03/23/22 09/19/22  [provider]  gabapentin (NEURONTIN) 300 MG capsule Take 1 capsule by mouth 3 (three) times daily. 02/23/22 02/23/23   [provider]  hydroxychloroquine (PLAQUENIL) 200 MG tablet Take 400 mg by mouth daily.    [provider]  hydrOXYzine (ATARAX) 50 MG tablet SMARTSIG:1 Tablet(s) By Mouth Every Evening 01/10/22   [provider]  ibuprofen (ADVIL) 600 MG tablet Take 600 mg by mouth every 6 (six) hours as needed.    [provider]  norethindrone (LYLEQ) 0.35 MG tablet Take 1 tablet by mouth daily.    [provider]  oxyCODONE-acetaminophen (PERCOCET) 5-325 MG tablet Take 1-2 tablets by mouth every 6 (six) hours as needed. Patient taking differently: Take 1-2 tablets by mouth every 6 (six) hours as needed for moderate pain. 02/15/22   Roxy Horseman, PA-C  predniSONE (DELTASONE) 5 MG tablet Take 5-25 mg by mouth as directed. Taking 25 mg daily currently until on the  02-21-22 will take 20 mg for a week, then titrate down 5 mg each week until taking 5 mg daily. 07/04/21   [provider]  Prenatal Vit-Fe Fumarate-FA (PRENATAL MULTIVITAMIN) TABS tablet Take 1 tablet by mouth daily at 12 noon.    [provider]  traMADol (ULTRAM-ER) 100 MG 24 hr tablet Take 100 mg by mouth daily as needed for pain. 02/14/22   [provider]  traZODone (DESYREL) 50 MG tablet Take 25-50 mg by mouth at bedtime as needed. 03/13/22   [provider]    Physical Exam: Vitals:   04/23/22 1734 04/23/22 2336 04/24/22 0119 04/24/22 0348  BP: (!) 121/90 119/79  125/84  Pulse: 98 (!) 105 88 84  Resp: 16 16  16   Temp: 99.2 F (37.3 C) 99.4 F (37.4 C)    TempSrc: Oral Oral    SpO2: 100% 100% 100% 100%  Weight:      Height:       Exam  Constitutional: Young Female currently in no acute distress  Eyes: PERRL, lids and conjunctivae normal ENMT: Mucous membranes are moist.   Neck: normal, supple    Respiratory: clear to auscultation bilaterally, no wheezing, no crackles. Normal respiratory effort. No accessory muscle use.  Cardiovascular: Regular rate  and rhythm, no murmurs / rubs / gallops. No extremity edema. 2+ pedal pulses. No carotid bruits.  Abdomen: no tenderness, no masses palpated. No hepatosplenomegaly. Bowel sounds positive.  Musculoskeletal: no clubbing / cyanosis.    Skin: Reviewed video from patient which notes ulcerations noted medial aspect of the right leg is open and on the lateral aspect of the right leg redness with raised area appreciated. Neurologic: CN 2-12 grossly intact. Strength 5/5 in all 4.  Psychiatric: Normal judgment and insight. Alert and oriented x 3. Normal mood.   Data Reviewed:  Reviewed labs and imaging as noted above in HPI  Assessment and Plan: Cellulitis and abscess of the right leg Acute.  Patient presented after being sent from wound care for redness and swelling of the distal aspect of the right leg.  CT scan of  the right leg revealed soft tissue defect with rim-enhancing collection containing air superior to the lesion measuring 1.2 x 0.5 x 3.1 cm suspicious for abscess and periosteal elevation with no bony erosions.  Wound  and blood cultures have been obtained.  Wound cultures positive for moderate gram-positive cocci in clusters and few gram-negative rods.  And patient was started on empiric antibiotics of vancomycin. -Admit to medical telemetry bed -Follow-up blood and wound cultures -Continue vancomycin per pharmacy -Hydrocodone/morphine as needed for pain -Wound care consult for other ulcers -Orthopedic surgery consulted, will follow-up for further recommendations  Systemic lupus erythematosus on chronic anticoagulation  Patient reports history of ulcers thought secondary to clotting due to lupus.  Home medication regimen includes Imuran 50 mg daily, hydroxychloroquine 400 mg daily,  prednisone 10 mg daily, Lovenox 60 mg IV every 12 hours. -Continue Imuran, hydroxychloroquine, and prednisone -Heparin drip per pharmacy  Normocytic anemia Hemoglobin 11.7 g/dL, but appears near patient's  baseline. -Continue to monitor   DVT prophylaxis: Heparin Advance Care Planning:   Code Status: Full Code    Consults: Orthopedics  Family Communication: Significant other updated at bedside  Severity of Illness: The appropriate patient status for this patient is INPATIENT. Inpatient status is judged to be reasonable and necessary in order to provide the required intensity of service to ensure the patient's safety. The patient's presenting symptoms, physical exam findings, and initial radiographic and laboratory data in the context of their chronic comorbidities is felt to place them at high risk for further clinical deterioration. Furthermore, it is not anticipated that the patient will be medically stable for discharge from the hospital within 2 midnights of admission.   * I certify that at the point of admission it is my clinical judgment that the patient will require inpatient hospital care spanning beyond 2 midnights from the point of admission due to high intensity of service, high risk for further deterioration and high frequency of surveillance required.*  Author: Clydie Braun, MD 04/24/2022 9:54 AM  For on call review [email or web address].

## 2022-04-24 NOTE — ED Notes (Signed)
Ambulated to restroom without assistance 

## 2022-04-24 NOTE — Progress Notes (Signed)
ANTICOAGULATION CONSULT NOTE - Follow Up Consult  Pharmacy Consult for heparin  Indication:  patient has a history of Lupus and thrombotic lesions   Allergies  Allergen Reactions   Doxycycline Hives and Rash    Blister patches on skin    Patient Measurements: Height: 5\' 3"  (160 cm) Weight: 62.1 kg (137 lb) IBW/kg (Calculated) : 52.4 Heparin Dosing Weight: 62.1kg   Vital Signs: Temp: 97.7 F (36.5 C) (08/29 2103) Temp Source: Oral (08/29 2103) BP: 116/78 (08/29 2103) Pulse Rate: 90 (08/29 2103)  Labs: Recent Labs    04/23/22 1958 04/24/22 1608 04/24/22 1713 04/24/22 2242  HGB 11.7* 11.6*  --   --   HCT 35.2* 37.0  --   --   PLT 302 243  --   --   HEPARINUNFRC  --   --  0.11* 0.19*  CREATININE 0.76 0.73  --   --      Estimated Creatinine Clearance: 87.4 mL/min (by C-G formula based on SCr of 0.73 mg/dL).  Assessment: Patient is currently admitted for lower leg wound infection with potential I & D. Patient is maintained on Lovenox 60mg  BID at home for treatment of thrombotic lesions secondary to Lupus.Last dose of enoxaparin on 8/28. Due to likely upcoming procedure switched to heparin for management. Patient's CBC and renal function are stable.   8/29 PM update:  Heparin level low but trending up  Goal of Therapy:  Heparin level 0.3-0.7 units/ml Monitor platelets by anticoagulation protocol: Yes   Plan:  Inc heparin to 1100 units/hr Re-check heparin level with AM labs  9/28, PharmD, BCPS Clinical Pharmacist Phone: [phone]

## 2022-04-24 NOTE — ED Notes (Signed)
Received verbal report Reba F RN at this time

## 2022-04-24 NOTE — Consult Note (Signed)
Reason for Consult:RLE ulcerations Referring Physician: Madelyn Flavors Time called: 1124 Time at bedside: 1137   Kelly [name] is an 26 y.o. female.  HPI: Kelly [name] comes in with about 2 weeks of infected right lower leg ulcerations. She has been dealing with the ulcerations for months and is under active treatment of the wound care center. She began to have increased pain and drainage along with fevers, chills, and sweats. She was treated with Keflex which did not improve things.  Past Medical History:  Diagnosis Date   Anxiety    Chest discomfort feb 2017   Pt states due to shortness of breath   Cough present for greater than 3 weeks    Late Entry to [employer]- April 2020- Social Distancing  11/27/2018   Pt signed up for [facility]. Will come to office to pick up BP cuff.    Lupus (systemic lupus erythematosus) (HCC)    dx 2010   Pneumonia    2015   Shortness of breath dyspnea feb 2017   changed inhaler with relief    Past Surgical History:  Procedure Laterality Date   BRONCHOSCOPY  11/2013   CESAREAN SECTION N/A 03/16/2019   Procedure: CESAREAN SECTION;  Surgeon: Levie Heritage, DO;  Location: [facility] LD [facility]S;  Service: Obstetrics;  Laterality: N/A;   WISDOM TOOTH EXTRACTION  2014    Family History  Problem Relation Age of Onset   Diabetes Paternal Grandmother     Social History:  reports that she has quit smoking. Her smoking use included cigarettes. She has never used smokeless tobacco. She reports that she does not currently use alcohol. She reports that she does not currently use drugs after having used the following drugs: Marijuana.  Allergies:  Allergies  Allergen Reactions   Doxycycline Hives and Rash    Blister patches on skin    Medications: I have reviewed the patient's current medications.  Results for orders placed or performed during the hospital encounter of 04/23/22 (from the past 48 hour(s))  Aerobic/Anaerobic Culture w Gram Stain (surgical/deep  wound)     Status: None (Preliminary result)   Collection Time: 04/23/22  4:04 PM   Specimen: Wound  Result Value Ref Range   Specimen Description      WOUND Performed at [facility], [address] [place], [place] [address]    Special Requests      NONE RIGHT MEDLI Performed at [facility], [address], [place], [place] [address]    Gram Stain      FEW WBC PRESENT, PREDOMINANTLY PMN MODERATE GRAM POSITIVE COCCI IN PAIRS FEW GRAM NEGATIVE RODS    Culture      CULTURE REINCUBATED FOR BETTER GROWTH Performed at [facility], [address], [place], [place] [address]    Report Status PENDING   CBC with Differential     Status: Abnormal   Collection Time: 04/23/22  7:58 PM  Result Value Ref Range   WBC 10.1 4.0 - 10.5 K/uL   RBC 3.78 (L) 3.87 - 5.11 MIL/uL   Hemoglobin 11.7 (L) 12.0 - 15.0 g/dL   HCT 24.0 (L) 97.3 - 53.2 %   MCV 93.1 80.0 - 100.0 fL   MCH 31.0 26.0 - 34.0 pg   MCHC 33.2 30.0 - 36.0 g/dL   RDW 99.2 42.6 - 83.4 %   Platelets 302 150 - 400 K/uL   nRBC 0.0 0.0 - 0.2 %   Neutrophils Relative % 74 %  Neutro Abs 7.5 1.7 - 7.7 K/uL   Lymphocytes Relative 16 %   Lymphs Abs 1.6 0.7 - 4.0 K/uL   Monocytes Relative 9 %   Monocytes Absolute 0.9 0.1 - 1.0 K/uL   Eosinophils Relative 1 %   Eosinophils Absolute 0.1 0.0 - 0.5 K/uL   Basophils Relative 0 %   Basophils Absolute 0.0 0.0 - 0.1 K/uL   Immature Granulocytes 0 %   Abs Immature Granulocytes 0.04 0.00 - 0.07 K/uL    Comment: Performed at [facility], [address] [place], [place] [address]  Lactic acid, plasma     Status: None   Collection Time: 04/23/22  7:58 PM  Result Value Ref Range   Lactic Acid, Venous 0.9 0.5 - 1.9 mmol/L    Comment: Performed at [facility], [address] [place], [place] [address]  Blood culture (routine x 2)     Status: None (Preliminary result)   Collection Time: 04/23/22  7:58 PM   Specimen: BLOOD  Result  Value Ref Range   Specimen Description BLOOD LEFT ANTECUBITAL    Special Requests      BOTTLES DRAWN AEROBIC AND ANAEROBIC BACTEROIDES CACCAE   Culture      NO GROWTH < 12 HOURS Performed at [facility], [address] [place], [place] [address]    Report Status PENDING   Blood culture (routine x 2)     Status: None (Preliminary result)   Collection Time: 04/23/22  7:58 PM   Specimen: BLOOD  Result Value Ref Range   Specimen Description BLOOD RIGHT ANTECUBITAL    Special Requests      BOTTLES DRAWN AEROBIC AND ANAEROBIC Blood Culture adequate volume   Culture      NO GROWTH < 12 HOURS Performed at [facility], [address] [place], [place] [address]    Report Status PENDING   Comprehensive metabolic panel     Status: Abnormal   Collection Time: 04/23/22  7:58 PM  Result Value Ref Range   Sodium 136 135 - 145 mmol/L   Potassium 4.4 3.5 - 5.1 mmol/L    Comment: HEMOLYSIS AT THIS LEVEL MAY AFFECT RESULT   Chloride 106 98 - 111 mmol/L   CO2 23 22 - 32 mmol/L   Glucose, Bld 102 (H) 70 - 99 mg/dL    Comment: Glucose reference range applies only to samples taken after fasting for at least 8 hours.   BUN 9 6 - 20 mg/dL   Creatinine, Ser 5.57 0.44 - 1.00 mg/dL   Calcium 9.0 8.9 - 32.2 mg/dL   Total Protein 8.4 (H) 6.5 - 8.1 g/dL   Albumin 3.1 (L) 3.5 - 5.0 g/dL   AST 32 15 - 41 U/L    Comment: HEMOLYSIS AT THIS LEVEL MAY AFFECT RESULT   ALT 29 0 - 44 U/L    Comment: HEMOLYSIS AT THIS LEVEL MAY AFFECT RESULT   Alkaline Phosphatase 51 38 - 126 U/L   Total Bilirubin 0.7 0.3 - 1.2 mg/dL    Comment: HEMOLYSIS AT THIS LEVEL MAY AFFECT RESULT   GFR, Estimated >60 >60 mL/min    Comment: (NOTE) Calculated using the CKD-EPI Creatinine Equation (2021)    Anion gap 7 5 - 15    Comment: Performed at [facility], [address], [place], [place] [address]    CT EXTREMITY LOWER RIGHT W CONTRAST  Result Date: 04/24/2022 CLINICAL DATA:  Soft tissue infection  suspected, lower leg. EXAM: CT OF THE LOWER RIGHT EXTREMITY WITH CONTRAST TECHNIQUE: Multidetector CT imaging of the lower right extremity was performed according to the standard protocol following intravenous contrast administration. RADIATION DOSE REDUCTION: This exam was performed according to the departmental dose-optimization program which includes automated exposure control, adjustment of the mA and/or kV according to patient size and/or use of iterative reconstruction technique. CONTRAST:  OMNIPAQUE IOHEXOL 300 MG/ML  SOLN COMPARISON:  04/23/2022. FINDINGS: Bones/Joint/Cartilage No acute fracture or dislocation. Mild periosteal elevation along the medial aspect of the tibia in the region of the skin wound. No bony erosions are seen. There is a cystic lesion with surrounding sclerosis at the base of the proximal phalanx of the first digit. A trace joint effusion is noted at the knee. Ligaments Suboptimally assessed by CT. Muscles and Tendons Within normal limits.  No intramuscular abscess or edema. Soft tissues A soft tissue defect is seen along the medial aspect of the mid to distal tibia. A hypodense collection containing foci of air is present in the subcutaneous soft tissues superior to the defect measuring 1.2 x 0.5 x 3.1 cm. Multiple calcifications are noted in the subcutaneous soft tissues. Mild subcutaneous edema is present over the dorsum of the foot. IMPRESSION: 1. Soft tissue defect along the medial aspect of the mid to distal tibia. There is a rim enhancing collection containing air superior to the lesion measuring 1.2 x 0.5 x 3.1 cm, suspicious for abscess. 2. Periosteal elevation involving the mid to distal tibia with no bony erosions, which may be reactive versus early osteomyelitis. Electronically Signed   By: Thornell Sartorius M.D.   On: 04/24/2022 03:42   DG Tibia/Fibula Right  Result Date: 04/23/2022 CLINICAL DATA:  Pain, wound infection in the skin EXAM: RIGHT TIBIA AND FIBULA - 2  VIEW COMPARISON:  01/24/2022 FINDINGS: No recent fracture or dislocation is seen. There are no focal lytic lesions. There is soft tissue deformity along the medial aspect of right lower leg suggesting open wound in the skin. There are numerous calcifications in the soft tissues, possibly related to chronic inflammation. IMPRESSION: No fracture or dislocation is seen. There are no focal lytic lesions. If there is clinical suspicion for osteomyelitis, follow-up MRI may be considered. There is open wound in the skin along the medial aspect of lower portion of right lower leg suggesting infectious process in the soft tissues. Electronically Signed   By: Ernie Avena M.D.   On: 04/23/2022 20:26    Review of Systems  Constitutional:  Positive for chills, diaphoresis and fever.  HENT:  Negative for ear discharge, ear pain, hearing loss and tinnitus.   Eyes:  Negative for photophobia and pain.  Respiratory:  Negative for cough and shortness of breath.   Cardiovascular:  Negative for chest pain.  Gastrointestinal:  Negative for abdominal pain, nausea and vomiting.  Genitourinary:  Negative for dysuria, flank pain, frequency and urgency.  Musculoskeletal:  Positive for arthralgias (Right lower leg). Negative for back pain, myalgias and neck pain.  Neurological:  Negative for dizziness and headaches.  Hematological:  Does not bruise/bleed easily.  Psychiatric/Behavioral:  The patient is not nervous/anxious.    Blood pressure 105/74, pulse (!) 109, temperature 99 F (37.2 C), temperature source Oral, resp. rate 20, height 5\' 3"  (1.6 m), weight 62.1 kg, last menstrual period 02/05/2022, SpO2 100 %. Physical Exam Constitutional:      General: She is not in acute distress.    Appearance: She is well-developed. She  is not diaphoretic.  HENT:     Head: Normocephalic and atraumatic.  Eyes:     General: No scleral icterus.       Right eye: No discharge.        Left eye: No discharge.      Conjunctiva/sclera: Conjunctivae normal.  Cardiovascular:     Rate and Rhythm: Normal rate and regular rhythm.  Pulmonary:     Effort: Pulmonary effort is normal. No respiratory distress.  Musculoskeletal:     Cervical back: Normal range of motion.     Comments: RLE No traumatic wounds, ecchymosis, or rash  Lateral and medial lower leg ulcerations with purulent discharge  No knee or ankle effusion  Knee stable to varus/ valgus and anterior/posterior stress  Sens DPN, SPN, TN intact  Motor EHL, ext, flex, evers 5/5  DP 1+, PT 0, 1+ np edema  Skin:    General: Skin is warm and dry.  Neurological:     Mental Status: She is alert.  Psychiatric:        Mood and Affect: Mood normal.        Behavior: Behavior normal.     Assessment/Plan: RLE ulcerations -- Would likely benefit from operative I&D. Dr. Lajoyce Corners to evaluate later today or in AM. [facility] for diet today.    Freeman Caldron, PA-C Orthopedic Surgery [phone] 04/24/2022, 11:48 AM

## 2022-04-24 NOTE — ED Notes (Addendum)
Pt requesting to talk to someone about why she is here and why she was moved and admitted. This RN advised that she will notify MD of request and that this RN can review the chart but she would need to speak with MD.

## 2022-04-24 NOTE — ED Notes (Signed)
Transport request placed room is ready

## 2022-04-24 NOTE — Progress Notes (Signed)
Report given to TG, RN. Patient in ED waiting for room to be cleaned.

## 2022-04-24 NOTE — ED Provider Notes (Signed)
[facility] EMERGENCY DEPARTMENT Provider Note   CSN: [id number] Arrival date & time: 04/23/22  1713     History  Chief Complaint  Patient presents with   Wound Infection    Kelly [name] is a 26 y.o. female.  HPI     This is a 26 year old female with a history of lupus who presents with concern for wound infection.  Patient reports that she recently has had significant cutaneous manifestations of her lupus.  She is on prednisone, azathioprine, Plaquenil.  She gets wound care.  She has recently been on antibiotics, Keflex, for potential infection.  She was seen and evaluated by wound care today with concern for worsening infection.  She has noted purulent drainage over the medial aspect of an ulcer on her right leg.  She has also had pain and redness on the lateral aspect of that same leg.  She reports significant pain.  She has not had any fevers.  Home Medications Prior to Admission medications   Medication Sig Start Date End Date Taking? Authorizing Provider  acetaminophen (TYLENOL) 500 MG tablet Take 1,000 mg by mouth every 6 (six) hours as needed for moderate pain or headache.    [provider]  albuterol (VENTOLIN HFA) 108 (90 Base) MCG/ACT inhaler Inhale 2 puffs into the lungs every 6 (six) hours as needed for wheezing or shortness of breath. 01/12/22   [provider]  azaTHIOprine (IMURAN) 50 MG tablet Take 50 mg by mouth daily. 01/30/22   [provider]  Calcium Carb-Cholecalciferol (CALCIUM 600-D) 600-10 MG-MCG TABS 1 tablet with a meal Orally Once a day for 30 day(s)    [provider]  Control Gel Formula Dressing (DUODERM CGF EXTRA THIN EX) Apply topically.    [provider]  enoxaparin (LOVENOX) 60 MG/0.6ML injection Inject into the skin. 03/23/22 09/19/22  [provider]  gabapentin (NEURONTIN) 300 MG capsule Take 1 capsule by mouth 3 (three) times daily. 02/23/22 02/23/23  [provider]  hydroxychloroquine (PLAQUENIL) 200 MG tablet Take 400 mg by mouth daily.    [provider]  hydrOXYzine (ATARAX) 50 MG tablet SMARTSIG:1 Tablet(s) By Mouth Every Evening 01/10/22   [provider]  ibuprofen (ADVIL) 600 MG tablet Take 600 mg by mouth every 6 (six) hours as needed.    [provider]  norethindrone (LYLEQ) 0.35 MG tablet Take 1 tablet by mouth daily.    [provider]  oxyCODONE-acetaminophen (PERCOCET) 5-325 MG tablet Take 1-2 tablets by mouth every 6 (six) hours as needed. Patient taking differently: Take 1-2 tablets by mouth every 6 (six) hours as needed for moderate pain. 02/15/22   Roxy Horseman, PA-C  predniSONE (DELTASONE) 5 MG tablet Take 5-25 mg by mouth as directed. Taking 25 mg daily currently until on the  02-21-22 will take 20 mg for a week, then titrate down 5 mg each week until taking 5 mg daily. 07/04/21   [provider]  Prenatal Vit-Fe Fumarate-FA (PRENATAL MULTIVITAMIN) TABS tablet Take 1 tablet by mouth daily at 12 noon.    [provider]  traMADol (ULTRAM-ER) 100 MG 24 hr tablet Take 100 mg by mouth daily as needed for pain. 02/14/22   [provider]  traZODone (DESYREL) 50 MG tablet Take 25-50 mg by mouth at bedtime as needed. 03/13/22   [provider]      Allergies    Doxycycline    Review of Systems   Review of Systems  Constitutional:  Negative for fever.  Respiratory:  Negative for shortness of breath.   Skin:  Positive for color change and wound.  All other systems reviewed and are negative.   Physical Exam Updated Vital Signs BP 125/84   Pulse 84   Temp 99.4 F (37.4 C) (Oral)   Resp 16   Ht 1.6 m (5\' 3" )   Wt 62.1 kg   LMP 02/05/2022   SpO2 100%   BMI 24.27 kg/m  Physical Exam Vitals and nursing note reviewed.  Constitutional:      Appearance: She is well-developed. She is not ill-appearing.  HENT:     Head: Normocephalic and atraumatic.      Mouth/Throat:     Mouth: Mucous membranes are moist.  Eyes:     Pupils: Pupils are equal, round, and reactive to light.  Cardiovascular:     Rate and Rhythm: Normal rate and regular rhythm.  Pulmonary:     Effort: Pulmonary effort is normal. No respiratory distress.  Abdominal:     Palpations: Abdomen is soft.     Tenderness: There is no abdominal tenderness.  Musculoskeletal:        General: No deformity.     Cervical back: Neck supple.  Skin:    General: Skin is warm and dry.     Comments: Darkened discoloration in patches over the face and bilateral upper extremities, right lower extremity there is a deep ulceration over the medial aspect of the calf with granular tissue, superiorly, there is purulent drainage that can be expressed, on the lateral aspect of the calf, there is erythema and induration, 2+ DP pulse  Neurological:     Mental Status: She is alert and oriented to person, place, and time.  Psychiatric:        Mood and Affect: Mood normal.     ED Results / Procedures / Treatments   Labs (all labs ordered are listed, but only abnormal results are displayed) Labs Reviewed  CBC WITH DIFFERENTIAL/PLATELET - Abnormal; Notable for the following components:      Result Value   RBC 3.78 (*)    Hemoglobin 11.7 (*)    HCT 35.2 (*)    All other components within normal limits  COMPREHENSIVE METABOLIC PANEL - Abnormal; Notable for the following components:   Glucose, Bld 102 (*)    Total Protein 8.4 (*)    Albumin 3.1 (*)    All other components within normal limits  AEROBIC/ANAEROBIC CULTURE W GRAM STAIN (SURGICAL/DEEP WOUND)  CULTURE, BLOOD (ROUTINE X 2)  CULTURE, BLOOD (ROUTINE X 2)  LACTIC ACID, PLASMA  LACTIC ACID, PLASMA    EKG None  Radiology CT EXTREMITY LOWER RIGHT W CONTRAST  Result Date: 04/24/2022 CLINICAL DATA:  Soft tissue infection suspected, lower leg. EXAM: CT OF THE LOWER RIGHT EXTREMITY WITH CONTRAST TECHNIQUE: Multidetector CT imaging of the  lower right extremity was performed according to the standard protocol following intravenous contrast administration. RADIATION DOSE REDUCTION: This exam was performed according to the departmental dose-optimization program which includes automated exposure control, adjustment of the mA and/or kV according to patient size and/or use of iterative reconstruction technique. CONTRAST:  04/26/2022 OMNIPAQUE IOHEXOL 300 MG/ML  SOLN COMPARISON:  04/23/2022. FINDINGS: Bones/Joint/Cartilage No acute fracture or dislocation. Mild periosteal elevation along the medial aspect of the tibia in the region of the skin wound. No bony erosions are seen. There is a cystic lesion with surrounding sclerosis at the base of the proximal phalanx of the first digit.  A trace joint effusion is noted at the knee. Ligaments Suboptimally assessed by CT. Muscles and Tendons Within normal limits.  No intramuscular abscess or edema. Soft tissues A soft tissue defect is seen along the medial aspect of the mid to distal tibia. A hypodense collection containing foci of air is present in the subcutaneous soft tissues superior to the defect measuring 1.2 x 0.5 x 3.1 cm. Multiple calcifications are noted in the subcutaneous soft tissues. Mild subcutaneous edema is present over the dorsum of the foot. IMPRESSION: 1. Soft tissue defect along the medial aspect of the mid to distal tibia. There is a rim enhancing collection containing air superior to the lesion measuring 1.2 x 0.5 x 3.1 cm, suspicious for abscess. 2. Periosteal elevation involving the mid to distal tibia with no bony erosions, which may be reactive versus early osteomyelitis. Electronically Signed   By: Thornell SartoriusLaura  Taylor M.D.   On: 04/24/2022 03:42   DG Tibia/Fibula Right  Result Date: 04/23/2022 CLINICAL DATA:  Pain, wound infection in the skin EXAM: RIGHT TIBIA AND FIBULA - 2 VIEW COMPARISON:  01/24/2022 FINDINGS: No recent fracture or dislocation is seen. There are no focal lytic lesions.  There is soft tissue deformity along the medial aspect of right lower leg suggesting open wound in the skin. There are numerous calcifications in the soft tissues, possibly related to chronic inflammation. IMPRESSION: No fracture or dislocation is seen. There are no focal lytic lesions. If there is clinical suspicion for osteomyelitis, follow-up MRI may be considered. There is open wound in the skin along the medial aspect of lower portion of right lower leg suggesting infectious process in the soft tissues. Electronically Signed   By: Ernie AvenaPalani  Rathinasamy M.D.   On: 04/23/2022 20:26    Procedures .Critical Care  Performed by: Shon BatonHorton, Jaiyla Granados F, MD Authorized by: Shon BatonHorton, Toa Mia F, MD   Critical care provider statement:    Critical care time (minutes):  35   Critical care was necessary to treat or prevent imminent or life-threatening deterioration of the following conditions: infection/iv antibiotics.   Critical care was time spent personally by me on the following activities:  Development of treatment plan with patient or surrogate, discussions with consultants, evaluation of patient's response to treatment, examination of patient, ordering and review of laboratory studies, ordering and review of radiographic studies, ordering and performing treatments and interventions, pulse oximetry, re-evaluation of patient's condition and review of old charts     Medications Ordered in ED Medications  HYDROcodone-acetaminophen (NORCO/VICODIN) 5-325 MG per tablet 1.5 tablet (1.5 tablets Oral Not Given 04/23/22 2355)  morphine (PF) 4 MG/ML injection 4 mg (4 mg Intravenous Given 04/24/22 0401)  vancomycin (VANCOCIN) IVPB 1000 mg/200 mL premix (0 mg Intravenous Stopped 04/24/22 0347)  morphine (PF) 4 MG/ML injection 4 mg (4 mg Intravenous Given 04/24/22 0108)  ondansetron (ZOFRAN) injection 4 mg (4 mg Intravenous Given 04/24/22 0107)  iohexol (OMNIPAQUE) 300 MG/ML solution 100 mL (100 mLs Intravenous Contrast  Given 04/24/22 0325)    ED Course/ Medical Decision Making/ A&P                           Medical Decision Making Amount and/or Complexity of Data Reviewed Radiology: ordered.  Risk Prescription drug management. Decision regarding hospitalization.   This patient presents to the ED for concern of wound infection, this involves an extensive number of treatment options, and is a complaint that carries with it a high risk of  complications and morbidity.  I considered the following differential and admission for this acute, potentially life threatening condition.  The differential diagnosis includes wound, abscess, osteomyelitis, necrotizing fasciitis  MDM:    This is a 26 year old female who presents with worsening wound to the right lower extremity.  Has had progressive worsening of wounds.  She has been on antibiotics.  She now has purulent drainage.  You can express the purulence from just superior to the wound.  Makes me question abscess or further deep space infection.  Labs obtained and reviewed.  No significant leukocytosis.  X-ray is nondiagnostic.  CT scan of the lower extremity obtained and is concerning for abscess in the region of concern.  Feel that she may need surgical debridement.  There is also some question of localized osteomyelitis.  She is clinically well-appearing.  She has received IV vancomycin.  Feel that she warrants admission for IV antibiotics and surgical evaluation for I&D versus debridement.  Lactate is normal.  No signs or symptoms of sepsis.  (Labs, imaging, consults)  Labs: I Ordered, and personally interpreted labs.  The pertinent results include: CBC, BMP, lactic, cultures  Imaging Studies ordered: I ordered imaging studies including x-ray, CT imaging I independently visualized and interpreted imaging. I agree with the radiologist interpretation  Additional history obtained from review.  External records from outside source obtained and reviewed including  otology and oncology notes  Cardiac Monitoring: The patient was maintained on a cardiac monitor.  I personally viewed and interpreted the cardiac monitored which showed an underlying rhythm of: Normal sinus rhythm  Reevaluation: After the interventions noted above, I reevaluated the patient and found that they have :improved  Social Determinants of Health: Lives independently  Disposition: Admit  Co morbidities that complicate the patient evaluation  Past Medical History:  Diagnosis Date   Anxiety    Chest discomfort feb 2017   Pt states due to shortness of breath   Cough present for greater than 3 weeks    Late Entry to [employer]- April 2020- Social Distancing  11/27/2018   Pt signed up for [employer]. Will come to office to pick up BP cuff.    Lupus (systemic lupus erythematosus) (HCC)    dx 2010   Pneumonia    2015   Shortness of breath dyspnea feb 2017   changed inhaler with relief     Medicines Meds ordered this encounter  Medications   DISCONTD: HYDROcodone-acetaminophen (NORCO) 7.5-325 MG per tablet 1 tablet   DISCONTD: HYDROcodone-acetaminophen (NORCO) 7.5-325 MG per tablet 1.5 tablet   HYDROcodone-acetaminophen (NORCO/VICODIN) 5-325 MG per tablet 1.5 tablet   vancomycin (VANCOCIN) IVPB 1000 mg/200 mL premix    Order Specific Question:   Indication:    Answer:   Wound Infection   morphine (PF) 4 MG/ML injection 4 mg   ondansetron (ZOFRAN) injection 4 mg   iohexol (OMNIPAQUE) 300 MG/ML solution 100 mL   morphine (PF) 4 MG/ML injection 4 mg    I have reviewed the patients home medicines and have made adjustments as needed  Problem List / ED Course: Problem List Items Addressed This Visit   None Visit Diagnoses     Wound infection    -  Primary   Relevant Medications   vancomycin (VANCOCIN) IVPB 1000 mg/200 mL premix (Completed)   Abscess                       Final Clinical Impression(s) / ED Diagnoses Final diagnoses:  Wound  infection   Abscess    Rx / DC Orders ED Discharge Orders     None         Lielle Vandervort, Mayer Masker, MD 04/24/22 [id number]

## 2022-04-24 NOTE — Progress Notes (Signed)
Pharmacy Antibiotic Note  Kelly [name] is a 26 y.o. female admitted on 04/23/2022 with cellulitis. Patient has been afebrile and WBC is 10.1. Patient reports 2 weeks of infected right lower leeg ulcerations and has been following with wound care center. Patient was treated with keflex but reports no improvement. Likely to undergo I & D per ortho. Pharmacy has been consulted for vancomycin dosing. Provider placed 7 day stop date on vancomycin per consult.   Plan: Patient has received 1x dose of vancomycin on 8/29 AM.  Start scheduled  IV vancomycin 1000mg  Q12H for eAUC: 473. Following culture data for de-escalation.   Height: 5\' 3"  (160 cm) Weight: 62.1 kg (137 lb) IBW/kg (Calculated) : 52.4  Temp (24hrs), Avg:99.2 F (37.3 C), Min:99 F (37.2 C), Max:99.4 F (37.4 C)  Recent Labs  Lab 04/23/22 1958  WBC 10.1  CREATININE 0.76  LATICACIDVEN 0.9    Estimated Creatinine Clearance: 87.4 mL/min (by C-G formula based on SCr of 0.76 mg/dL).    Allergies  Allergen Reactions   Doxycycline Hives and Rash    Blister patches on skin    Thank you for allowing pharmacy to be a part of this patient's care.  04/24/2022 12:13 PM

## 2022-04-24 NOTE — ED Notes (Signed)
Transport here for pt at this time

## 2022-04-24 NOTE — ED Notes (Signed)
This RN in room to start another IV for medications. Exampled that needed another IV for heparin and vanc administration. Pt questioned the IV in her RAC and why it was staying. Again example the reason for 2 IVS. Pt concerned about the blood around the IV in her RAC and this RN advised that if its removed, she will still need 2 IVS. Pt stated "that's fine". IV removed.

## 2022-04-25 ENCOUNTER — Inpatient Hospital Stay (HOSPITAL_COMMUNITY): Payer: Managed Care, Other (non HMO)

## 2022-04-25 DIAGNOSIS — Z7901 Long term (current) use of anticoagulants: Secondary | ICD-10-CM | POA: Diagnosis not present

## 2022-04-25 DIAGNOSIS — M329 Systemic lupus erythematosus, unspecified: Secondary | ICD-10-CM | POA: Diagnosis not present

## 2022-04-25 DIAGNOSIS — T148XXA Other injury of unspecified body region, initial encounter: Secondary | ICD-10-CM | POA: Diagnosis not present

## 2022-04-25 DIAGNOSIS — D649 Anemia, unspecified: Secondary | ICD-10-CM | POA: Diagnosis not present

## 2022-04-25 DIAGNOSIS — L03119 Cellulitis of unspecified part of limb: Secondary | ICD-10-CM | POA: Diagnosis not present

## 2022-04-25 DIAGNOSIS — L089 Local infection of the skin and subcutaneous tissue, unspecified: Secondary | ICD-10-CM

## 2022-04-25 DIAGNOSIS — L02419 Cutaneous abscess of limb, unspecified: Secondary | ICD-10-CM | POA: Diagnosis not present

## 2022-04-25 LAB — CBC
HCT: 35.1 % — ABNORMAL LOW (ref 36.0–46.0)
Hemoglobin: 11.6 g/dL — ABNORMAL LOW (ref 12.0–15.0)
MCH: 30.2 pg (ref 26.0–34.0)
MCHC: 33 g/dL (ref 30.0–36.0)
MCV: 91.4 fL (ref 80.0–100.0)
Platelets: 273 10*3/uL (ref 150–400)
RBC: 3.84 MIL/uL — ABNORMAL LOW (ref 3.87–5.11)
RDW: 13.6 % (ref 11.5–15.5)
WBC: 6.3 10*3/uL (ref 4.0–10.5)
nRBC: 0 % (ref 0.0–0.2)

## 2022-04-25 LAB — COMPREHENSIVE METABOLIC PANEL
ALT: 26 U/L (ref 0–44)
AST: 18 U/L (ref 15–41)
Albumin: 2.7 g/dL — ABNORMAL LOW (ref 3.5–5.0)
Alkaline Phosphatase: 39 U/L (ref 38–126)
Anion gap: 11 (ref 5–15)
BUN: 7 mg/dL (ref 6–20)
CO2: 22 mmol/L (ref 22–32)
Calcium: 9.2 mg/dL (ref 8.9–10.3)
Chloride: 104 mmol/L (ref 98–111)
Creatinine, Ser: 0.7 mg/dL (ref 0.44–1.00)
GFR, Estimated: >60 mL/min (ref >60)
Glucose, Bld: 92 mg/dL (ref 70–99)
Potassium: 3.8 mmol/L (ref 3.5–5.1)
Sodium: 137 mmol/L (ref 135–145)
Total Bilirubin: 0.5 mg/dL (ref 0.3–1.2)
Total Protein: 7.5 g/dL (ref 6.5–8.1)

## 2022-04-25 LAB — HEPARIN LEVEL (UNFRACTIONATED)
Heparin Unfractionated: 0.25 IU/mL — ABNORMAL LOW (ref 0.30–0.70)
Heparin Unfractionated: 0.44 IU/mL (ref 0.30–0.70)

## 2022-04-25 LAB — GLUCOSE, CAPILLARY: Glucose-Capillary: 82 mg/dL (ref 70–99)

## 2022-04-25 MED ORDER — MENTHOL 3 MG MT LOZG: 1.0000 | LOZENGE | OROMUCOSAL | Status: DC | PRN

## 2022-04-25 MED ORDER — SODIUM CHLORIDE 0.9 % IV SOLN
2.0000 g | INTRAVENOUS | Status: DC
  Administered 2022-04-25: 2 g via INTRAVENOUS
  Filled 2022-04-25: qty 20

## 2022-04-25 MED ORDER — MOLNUPIRAVIR EUA 200MG CAPSULE
4.0000 | ORAL_CAPSULE | Freq: Two times a day (BID) | ORAL | Status: DC
Start: 2022-04-25 — End: 2022-04-26
  Administered 2022-04-25 – 2022-04-26 (×2): 800 mg via ORAL
  Filled 2022-04-25: qty 4

## 2022-04-25 MED ORDER — VITAMIN C 500 MG PO TABS
500.0000 mg | ORAL_TABLET | Freq: Every day | ORAL | Status: DC
  Administered 2022-04-25 – 2022-04-26 (×2): 500 mg via ORAL
  Filled 2022-04-25 (×2): qty 1

## 2022-04-25 MED ORDER — ZINC SULFATE 220 (50 ZN) MG PO CAPS
220.0000 mg | ORAL_CAPSULE | Freq: Every day | ORAL | Status: DC
  Administered 2022-04-25 – 2022-04-26 (×2): 220 mg via ORAL
  Filled 2022-04-25 (×2): qty 1

## 2022-04-25 NOTE — Plan of Care (Signed)

## 2022-04-25 NOTE — Progress Notes (Signed)
ANTICOAGULATION CONSULT NOTE - Follow Up Consult  Pharmacy Consult for heparin  Indication:  patient has a history of Lupus and thrombotic lesions   Allergies  Allergen Reactions   Doxycycline Hives and Rash    Blister patches on skin    Patient Measurements: Height: 5\' 3"  (160 cm) Weight: 62.1 kg (137 lb) IBW/kg (Calculated) : 52.4 Heparin Dosing Weight: 62.1kg   Vital Signs: Temp: 98 F (36.7 C) (08/30 0600) Temp Source: Oral (08/30 0600) BP: 117/89 (08/30 0600) Pulse Rate: 84 (08/30 0600)  Labs: Recent Labs    04/23/22 1958 04/24/22 1608 04/24/22 1713 04/24/22 2242  HGB 11.7* 11.6*  --   --   HCT 35.2* 37.0  --   --   PLT 302 243  --   --   HEPARINUNFRC  --   --  0.11* 0.19*  CREATININE 0.76 0.73  --   --      Estimated Creatinine Clearance: 87.4 mL/min (by C-G formula based on SCr of 0.73 mg/dL).  Assessment: Patient is currently admitted for lower leg wound infection with potential I & D. Patient is maintained on Lovenox 60mg  BID at home for treatment of thrombotic lesions secondary to Lupus.Last dose of enoxaparin on 8/28. Due to likely upcoming procedure switched to heparin for management. Pharmacy has been consulted to dose IV heparin.  Heparin level subtherapeutic at 0.25 on heparin 1100 units/hr. CBC stable. No issues with heparin infusion or signs of bleeding per RN.   Goal of Therapy:  Heparin level 0.3-0.7 units/ml Monitor platelets by anticoagulation protocol: Yes   Plan:  Increase heparin drip to 1250 units/hr Check heparin level in 6 hours Monitor daily heparin level and CBC  F/u OR plans and ability to transition back to Lovenox  , PharmD, BCPS 04/25/2022 8:26 AM

## 2022-04-25 NOTE — Hospital Course (Addendum)
Kelly [name] is a 26 y.o. female with a history of SLE and chronic ulcers who presented secondary to presented secondary to concern for infected wound/cellulitis.  Patient started on empiric antibiotics.  Orthopedic surgery consulted for concern of abscess.  Incidentally, patient was found to have COVID 19 infection; known exposure.

## 2022-04-25 NOTE — Progress Notes (Signed)
ANTICOAGULATION CONSULT NOTE - Follow Up Consult  Pharmacy Consult for heparin  Indication:  patient has a history of Lupus and thrombotic lesions   Allergies  Allergen Reactions   Doxycycline Hives and Rash    Blister patches on skin    Patient Measurements: Height: 5\' 3"  (160 cm) Weight: 62.1 kg (137 lb) IBW/kg (Calculated) : 52.4 Heparin Dosing Weight: 62.1kg   Vital Signs: Temp: 98.5 F (36.9 C) (08/30 0949) Temp Source: Oral (08/30 0949) BP: 122/91 (08/30 0949) Pulse Rate: 73 (08/30 0949)  Labs: Recent Labs    04/23/22 1958 04/24/22 1608 04/24/22 1713 04/24/22 2242 04/25/22 0805 04/25/22 1448  HGB 11.7* 11.6*  --   --  11.6*  --   HCT 35.2* 37.0  --   --  35.1*  --   PLT 302 243  --   --  273  --   HEPARINUNFRC  --   --    < > 0.19* 0.25* 0.44  CREATININE 0.76 0.73  --   --  0.70  --    < > = values in this interval not displayed.     Estimated Creatinine Clearance: 87.4 mL/min (by C-G formula based on SCr of 0.7 mg/dL).  Assessment: Patient is currently admitted for lower leg wound infection with potential I & D. Patient is maintained on Lovenox 60mg  BID at home for treatment of thrombotic lesions secondary to Lupus.Last dose of enoxaparin on 8/28. Due to likely upcoming procedure switched to heparin for management. Pharmacy has been consulted to dose IV heparin.  Heparin level therapeutic at 0.44 on heparin 1250 units/hr. CBC stable. No issues with heparin infusion or signs of bleeding per RN.   Goal of Therapy:  Heparin level 0.3-0.7 units/ml Monitor platelets by anticoagulation protocol: Yes   Plan:  Continue heparin drip at 1250 units/hr Check heparin level in AM Monitor daily heparin level and CBC  F/u OR plans and ability to transition back to Lovenox  Jacere Pangborn A. , PharmD, BCPS, FNKF Clinical Pharmacist [place] Please utilize Amion for appropriate phone number to reach the unit pharmacist [facility])  04/25/2022 4:12 PM

## 2022-04-25 NOTE — Progress Notes (Signed)
  Transition of Care [facility]) Screening Note   Patient Details  Name: Kelly [name] Date of Birth: 27-Feb-1996   Transition of Care [facility]) CM/SW Contact:    Tom-Johnson, Hershal Coria, RN Phone Number: 04/25/2022, 4:34 PM  Patient is admitted for Rt leg Cellulitis and abscess. On IV abx.  Transition of Care Department [facility]) has reviewed patient and no TOC needs or recommendations have been identified at this time. TOC will continue to monitor patient advancement through interdisciplinary progression rounds. If new patient transition needs arise, please place a TOC consult.

## 2022-04-25 NOTE — Consult Note (Signed)
ORTHOPAEDIC CONSULTATION  REQUESTING PHYSICIAN: Narda Bonds, MD  Chief Complaint: Chronic draining ulcer right leg.  HPI: Kelly [name] is a 26 y.o. female who presents with lupus with a chronic ulcer medial aspect of the right leg with drainage.  Patient has been following with the wound center and was referred to the emergency room and admission for the draining ulcer.  Past Medical History:  Diagnosis Date   Anxiety    Chest discomfort feb 2017   Pt states due to shortness of breath   Cough present for greater than 3 weeks    Late Entry to [facility]- April 2020- Social Distancing  11/27/2018   Pt signed up for [facility]. Will come to office to pick up BP cuff.    Lupus (systemic lupus erythematosus) (HCC)    dx 2010   Pneumonia    2015   Shortness of breath dyspnea feb 2017   changed inhaler with relief   Past Surgical History:  Procedure Laterality Date   BRONCHOSCOPY  11/2013   CESAREAN SECTION N/A 03/16/2019   Procedure: CESAREAN SECTION;  Surgeon: Levie Heritage, DO;  Location: [facility];  Service: Obstetrics;  Laterality: N/A;   WISDOM TOOTH EXTRACTION  2014   Social History   Socioeconomic History   Marital status: Married    Spouse name: Not on file   Number of children: Not on file   Years of education: Not on file   Highest education level: Not on file  Occupational History   Not on file  Tobacco Use   Smoking status: Former    Types: Cigarettes   Smokeless tobacco: Never  Vaping Use   Vaping Use: Never used  Substance and Sexual Activity   Alcohol use: Not Currently   Drug use: Not Currently    Types: Marijuana   Sexual activity: Yes    Birth control/protection: OCP  Other Topics Concern   Not on file  Social History Narrative   Not on file   Social Determinants of Health   Financial Resource Strain: Not on file  Food Insecurity: Not on file  Transportation Needs: Not on file  Physical Activity: Not on file  Stress: Not on  file  Social Connections: Not on file   Family History  Problem Relation Age of Onset   Diabetes Paternal Grandmother    - negative except otherwise stated in the family history section Allergies  Allergen Reactions   Doxycycline Hives and Rash    Blister patches on skin   Prior to Admission medications   Medication Sig Start Date End Date Taking? Authorizing Provider  acetaminophen (TYLENOL) 500 MG tablet Take 1,000 mg by mouth every 6 (six) hours as needed for moderate pain or headache.   Yes [provider]  azaTHIOprine (IMURAN) 50 MG tablet Take 50 mg by mouth daily. 01/30/22  Yes [provider]  Calcium Carb-Cholecalciferol (CALCIUM 600-D) 600-10 MG-MCG TABS 1 tablet with a meal Orally Once a day for 30 day(s)   Yes [provider]  enoxaparin (LOVENOX) 60 MG/0.6ML injection Inject 60 mg into the skin every 12 (twelve) hours. 03/23/22 09/19/22 Yes [provider]  hydroxychloroquine (PLAQUENIL) 200 MG tablet Take 400 mg by mouth daily.   Yes [provider]  predniSONE (DELTASONE) 5 MG tablet Take 5-25 mg by mouth as directed. Taking 25 mg daily currently until on the  02-21-22 will take 20 mg for a week, then titrate down 5 mg each week until  taking 5 mg daily. 07/04/21  Yes [provider]  Control Gel Formula Dressing (DUODERM CGF EXTRA THIN EX) Apply topically.    [provider]  oxyCODONE-acetaminophen (PERCOCET) 5-325 MG tablet Take 1-2 tablets by mouth every 6 (six) hours as needed. Patient not taking: Reported on 04/24/2022 02/15/22   Montine Circle, PA-C   MR TIBIA FIBULA RIGHT WO CONTRAST  Result Date: 04/25/2022 CLINICAL DATA:  Anterior right lower leg wound. History of systemic lupus erythematosus. Soft tissue infection suspected. EXAM: MRI OF LOWER RIGHT EXTREMITY WITHOUT CONTRAST TECHNIQUE: Multiplanar, multisequence MR imaging of the right lower leg was performed. No intravenous contrast was administered.  COMPARISON:  Radiographs 04/23/2022.  CT 04/24/2022. FINDINGS: Bones/Joint/Cartilage No cortical destruction or suspicious marrow signal within the right tibia or fibula. As suggested on CT, there may be a small amount of tibial periosteal reaction along the posterior aspect of the wound, although this is difficult to assess in this patient with multiple underlying soft tissue calcifications attributed to the systemic lupus erythematosus. No evidence of significant right knee or ankle joint effusion. Incidental imaging of the left lower leg on the coronal images demonstrates no significant findings. Ligaments Not relevant for exam or indication. No ligamentous abnormalities are identified. Muscles and Tendons No intramuscular fluid collection or atrophy identified. The lower leg tendons appear intact without significant tenosynovitis. Soft tissues As seen on CT, there is a large soft tissue defect involving the anteromedial aspect of the distal right lower leg. There is underlying diffuse edema and ill-defined fluid throughout the adjacent subcutaneous fat. Just inferior to the wound, there is a small ill-defined collection which measures approximately 3.9 x 0.5 cm transverse (image 51/6). Superior to the wound, there is less well-defined fluid measuring 2.4 x 0.4 cm transverse on image 37/6. This extends approximately 5.2 cm in length on sagittal image 15/7. There is asymmetric mild generalized subcutaneous edema within the right lower leg relative to the left. As correlated with prior imaging, there are scattered subcutaneous calcifications. IMPRESSION: 1. Soft tissue defect involving the medial aspect of the right lower leg with subjacent inflammatory changes and ill-defined fluid collections consistent with soft tissue infection (cellulitis and probable small superficial abscesses). 2. No evidence of myositis or tenosynovitis. 3. No specific signs of osteomyelitis or septic arthritis. Electronically Signed   By:  Richardean Sale M.D.   On: 04/25/2022 10:06   CT EXTREMITY LOWER RIGHT W CONTRAST  Result Date: 04/24/2022 CLINICAL DATA:  Soft tissue infection suspected, lower leg. EXAM: CT OF THE LOWER RIGHT EXTREMITY WITH CONTRAST TECHNIQUE: Multidetector CT imaging of the lower right extremity was performed according to the standard protocol following intravenous contrast administration. RADIATION DOSE REDUCTION: This exam was performed according to the departmental dose-optimization program which includes automated exposure control, adjustment of the mA and/or kV according to patient size and/or use of iterative reconstruction technique. CONTRAST:  194mL OMNIPAQUE IOHEXOL 300 MG/ML  SOLN COMPARISON:  04/23/2022. FINDINGS: Bones/Joint/Cartilage No acute fracture or dislocation. Mild periosteal elevation along the medial aspect of the tibia in the region of the skin wound. No bony erosions are seen. There is a cystic lesion with surrounding sclerosis at the base of the proximal phalanx of the first digit. A trace joint effusion is noted at the knee. Ligaments Suboptimally assessed by CT. Muscles and Tendons Within normal limits.  No intramuscular abscess or edema. Soft tissues A soft tissue defect is seen along the medial aspect of the mid to distal tibia. A hypodense collection containing  foci of air is present in the subcutaneous soft tissues superior to the defect measuring 1.2 x 0.5 x 3.1 cm. Multiple calcifications are noted in the subcutaneous soft tissues. Mild subcutaneous edema is present over the dorsum of the foot. IMPRESSION: 1. Soft tissue defect along the medial aspect of the mid to distal tibia. There is a rim enhancing collection containing air superior to the lesion measuring 1.2 x 0.5 x 3.1 cm, suspicious for abscess. 2. Periosteal elevation involving the mid to distal tibia with no bony erosions, which may be reactive versus early osteomyelitis. Electronically Signed   By: Thornell Sartorius M.D.   On:  04/24/2022 03:42   DG Tibia/Fibula Right  Result Date: 04/23/2022 CLINICAL DATA:  Pain, wound infection in the skin EXAM: RIGHT TIBIA AND FIBULA - 2 VIEW COMPARISON:  01/24/2022 FINDINGS: No recent fracture or dislocation is seen. There are no focal lytic lesions. There is soft tissue deformity along the medial aspect of right lower leg suggesting open wound in the skin. There are numerous calcifications in the soft tissues, possibly related to chronic inflammation. IMPRESSION: No fracture or dislocation is seen. There are no focal lytic lesions. If there is clinical suspicion for osteomyelitis, follow-up MRI may be considered. There is open wound in the skin along the medial aspect of lower portion of right lower leg suggesting infectious process in the soft tissues. Electronically Signed   By: Ernie Avena M.D.   On: 04/23/2022 20:26   - pertinent xrays, CT, MRI studies were reviewed and independently interpreted  Positive ROS: All other systems have been reviewed and were otherwise negative with the exception of those mentioned in the HPI and as above.  Physical Exam: General: Alert, no acute distress Psychiatric: Patient is competent for consent with normal mood and affect Lymphatic: No axillary or cervical lymphadenopathy Cardiovascular: No pedal edema Respiratory: No cyanosis, no use of accessory musculature GI: No organomegaly, abdomen is soft and non-tender    Images:  @ENCIMAGES @  Labs:  Lab Results  Component Value Date   ESRSEDRATE 115 (H) 04/24/2022   ESRSEDRATE 56 (H) 01/24/2022   ESRSEDRATE 20 10/04/2016   CRP 8.1 (H) 04/24/2022   REPTSTATUS PENDING 04/23/2022   REPTSTATUS PENDING 04/23/2022   GRAMSTAIN  04/23/2022    FEW WBC PRESENT, PREDOMINANTLY PMN MODERATE GRAM POSITIVE COCCI IN PAIRS FEW GRAM NEGATIVE RODS    CULT  04/23/2022    NO GROWTH 2 DAYS Performed at [facility], [address], [place], [place]    CULT  04/23/2022    NO  GROWTH 2 DAYS Performed at [facility], [address] [place], [place]    [facility] STREPTOCOCCUS PYOGENES 08/17/2021    Lab Results  Component Value Date   ALBUMIN 2.7 (L) 04/25/2022   ALBUMIN 3.1 (L) 04/23/2022   ALBUMIN 3.5 02/18/2022        Latest Ref Rng & Units 04/25/2022    8:05 AM 04/24/2022    4:08 PM 04/23/2022    7:58 PM  CBC EXTENDED  WBC 4.0 - 10.5 K/uL 6.3  8.5  10.1   RBC 3.87 - 5.11 MIL/uL 3.84  3.80  3.78   Hemoglobin 12.0 - 15.0 g/dL 04/25/2022  74.2  59.5   HCT 36.0 - 46.0 % 35.1  37.0  35.2   Platelets 150 - 400 K/uL 273  243  302   NEUT# 1.7 - 7.7 K/uL  5.5  7.5   Lymph# 0.7 -  4.0 K/uL  1.5  1.6     Neurologic: Patient does not have protective sensation bilateral lower extremities.   MUSCULOSKELETAL:   Skin: Examination patient has a wound over the mid tibial shaft medial border approximately 2 cm in diameter.  There is undermining that extends proximally with fluid drainage.  The wound is open and draining.  There is also some mild cellulitis in the lateral calf.  Review of the MRI scan does not show any fluid collection in the lateral calf however the open ulcer does communicate with a fluid collection more proximally.  This fluid collection is open and draining.  There is no signs of any destructive bony changes.  The wound bed has healthy granulation tissue.  White cell count is 6.3 hemoglobin 11.6.  Albumin 2.7.  Sed rate 115 with a C-reactive protein of 8.1.  Assessment: Assessment: Chronic wound medial right tibia with draining abscess.  Plan: The wound is open the fluid drainage is decompressed by the open wound.  Would recommend IV antibiotics and conservative wound care.  Since this area of fluid collection is open to the wound this should decompress on its own and be able to avoid surgery.  Recommend cleansing with Dial soap and packing the wound open with iodoform gauze change twice a day.  Thank you for the consult and the  opportunity to see Kelly [name], [place] [phone] 6:24 PM

## 2022-04-25 NOTE — Progress Notes (Addendum)
PROGRESS NOTE    Kelly [name]  GBT:[id number] DOB: 07-05-1996 DOA: 04/23/2022 PCP: Dione Booze, MD   Brief Narrative: Kelly [name] is a 26 y.o. female with a history of SLE and chronic ulcers who presented secondary to presented secondary to concern for infected wound/cellulitis.  Patient started on empiric antibiotics.  Orthopedic surgery consulted for concern of abscess.  Incidentally, patient was found to have COVID 19 infection; known exposure.   Assessment and Plan:  Cellulitis and abscess of the right leg Acute infection on chronic wound.  CT scan significant for likely abscess.  Orthopedic surgery was consulted.  Patient empirically started on vancomycin.  Superficial wound culture obtained in the ED and is currently preliminarily significant for E. coli and Enterococcus species.  MRI obtained and was significant for no evidence of osteomyelitis. -Orthopedic surgery recommendations: Pending today -Continue vancomycin IV -Start ceftriaxone IV -Follow-up wound culture  COVID-19 infection Patient is symptom with sore throat.  No clinical evidence of pneumonia.  No hypoxia.  Patient is high risk secondary to immunocompromise state in setting of SLE with treatment.  Patient states that she is fully vaccinated. -Start molnupiravir x5 days -Vitamin C and zinc  Systemic lupus erythematosus -Continue Imuran, prednisone, hydroxychloroquine -Lovenox switched to heparin IV secondary to possible need for surgical management  Normocytic anemia Mildly anemic with a hemoglobin of 11.7 g/dL.  Patient is a menstruating female.  No current evidence of acute bleeding.   DVT prophylaxis: Heparin IV Code Status:   Code Status: Full Code Family Communication: None at bedside Disposition Plan: Discharge home likely in 24 to 48 hours pending orthopedic surgery recommendations/management   Consultants:  Orthopedic surgery  Procedures:   None  Antimicrobials: Vancomycin Ceftriaxone   Subjective: Patient reports no significant issues overnight.  She has a sore throat but otherwise no respiratory illness symptoms.  Objective: BP (!) 122/91 (BP Location: Left Arm)   Pulse 73   Temp 98.5 F (36.9 C) (Oral)   Resp 18   Ht 5\' 3"  (1.6 m)   Wt 62.1 kg   LMP 02/05/2022   SpO2 100%   BMI 24.27 kg/m   Examination:  General exam: Appears calm and comfortable Respiratory system: Respiratory effort normal. Gastrointestinal system: Abdomen is non-distended, soft and non-tender. Central nervous system: Alert and oriented. No focal neurological deficits. Musculoskeletal: Right leg with pain gauze dressing Skin: No cyanosis. No rashes Psychiatry: Judgement and insight appear normal. Mood & affect appropriate.    Data Reviewed: I have personally reviewed following labs and imaging studies  CBC Lab Results  Component Value Date   WBC 6.3 04/25/2022   RBC 3.84 (L) 04/25/2022   HGB 11.6 (L) 04/25/2022   HCT 35.1 (L) 04/25/2022   MCV 91.4 04/25/2022   MCH 30.2 04/25/2022   PLT 273 04/25/2022   MCHC 33.0 04/25/2022   RDW 13.6 04/25/2022   LYMPHSABS 1.5 04/24/2022   MONOABS 1.3 (H) 04/24/2022   EOSABS 0.1 04/24/2022   BASOSABS 0.0 04/24/2022     Last metabolic panel Lab Results  Component Value Date   NA 137 04/25/2022   K 3.8 04/25/2022   CL 104 04/25/2022   CO2 22 04/25/2022   BUN 7 04/25/2022   CREATININE 0.70 04/25/2022   GLUCOSE 92 04/25/2022   GFRNONAA >60 04/25/2022   GFRAA >60 03/15/2019   CALCIUM 9.2 04/25/2022   PROT 7.5 04/25/2022   ALBUMIN 2.7 (L) 04/25/2022   LABGLOB 3.3 10/04/2016   AGRATIO 1.1 (L) 10/04/2016  BILITOT 0.5 04/25/2022   ALKPHOS 39 04/25/2022   AST 18 04/25/2022   ALT 26 04/25/2022   ANIONGAP 11 04/25/2022    GFR: Estimated Creatinine Clearance: 87.4 mL/min (by C-G formula based on SCr of 0.7 mg/dL).  Recent Results (from the past 240 hour(s))  Aerobic/Anaerobic  Culture w Gram Stain (surgical/deep wound)     Status: None (Preliminary result)   Collection Time: 04/23/22  4:04 PM   Specimen: Wound  Result Value Ref Range Status   Specimen Description   Final    WOUND Performed at [facility], [address] [place], [place] [address]    Special Requests   Final    NONE RIGHT MEDLI Performed at [facility], [address], [place], [place] [address]    Gram Stain   Final    FEW WBC PRESENT, PREDOMINANTLY PMN MODERATE GRAM POSITIVE COCCI IN PAIRS FEW GRAM NEGATIVE RODS    Culture   Final    ABUNDANT ESCHERICHIA COLI MODERATE ENTEROCOCCUS FAECALIS SUSCEPTIBILITIES TO FOLLOW HOLDING FOR POSSIBLE ANAEROBE Performed at [facility], [address], [place], [place] [address]    Report Status PENDING  Incomplete  Blood culture (routine x 2)     Status: None (Preliminary result)   Collection Time: 04/23/22  7:58 PM   Specimen: BLOOD  Result Value Ref Range Status   Specimen Description BLOOD LEFT ANTECUBITAL  Final   Special Requests   Final    BOTTLES DRAWN AEROBIC AND ANAEROBIC Blood Culture adequate volume   Culture   Final    NO GROWTH 2 DAYS Performed at [facility], [address], [place], [place] [address]    Report Status PENDING  Incomplete  Blood culture (routine x 2)     Status: None (Preliminary result)   Collection Time: 04/23/22  7:58 PM   Specimen: BLOOD  Result Value Ref Range Status   Specimen Description BLOOD RIGHT ANTECUBITAL  Final   Special Requests   Final    BOTTLES DRAWN AEROBIC AND ANAEROBIC Blood Culture adequate volume   Culture   Final    NO GROWTH 2 DAYS Performed at [facility], [address], [place], [place] [address]    Report Status PENDING  Incomplete  SARS Coronavirus 2 by RT PCR (hospital order, performed in [facility] hospital lab) *cepheid single result test* Anterior Nasal Swab     Status: Abnormal   Collection Time: 04/24/22 10:33  AM   Specimen: Anterior Nasal Swab  Result Value Ref Range Status   SARS Coronavirus 2 by RT PCR POSITIVE (A) NEGATIVE Final    Comment: (NOTE) SARS-CoV-2 target nucleic acids are DETECTED  SARS-CoV-2 RNA is generally detectable in upper respiratory specimens  during the acute phase of infection.  Positive results are indicative  of the presence of the identified virus, but do not rule out bacterial infection or co-infection with other pathogens not detected by the test.  Clinical correlation with patient history and  other diagnostic information is necessary to determine patient infection status.  The expected result is negative.  Fact Sheet for Patients:   RoadLapTop.co.za   Fact Sheet for Healthcare Providers:   http://kim-miller.com/    This test is not yet approved or cleared by the [place] FDA and  has been authorized for detection and/or diagnosis of SARS-CoV-2 by FDA under an Emergency Use Authorization (EUA).  This EUA will remain in effect (meaning this test can be  used) for the duration of  the COVID-19 declaration under Section 564(b)(1)  of the Act, 21 U.S.C. section 360-bbb-3(b)(1), unless the authorization is terminated or revoked sooner.   Performed at [facility], [address], [place], [place] [address]       Radiology Studies: MR TIBIA FIBULA RIGHT WO CONTRAST  Result Date: 04/25/2022 CLINICAL DATA:  Anterior right lower leg wound. History of systemic lupus erythematosus. Soft tissue infection suspected. EXAM: MRI OF LOWER RIGHT EXTREMITY WITHOUT CONTRAST TECHNIQUE: Multiplanar, multisequence MR imaging of the right lower leg was performed. No intravenous contrast was administered. COMPARISON:  Radiographs 04/23/2022.  CT 04/24/2022. FINDINGS: Bones/Joint/Cartilage No cortical destruction or suspicious marrow signal within the right tibia or fibula. As suggested on CT, there may be a small amount of  tibial periosteal reaction along the posterior aspect of the wound, although this is difficult to assess in this patient with multiple underlying soft tissue calcifications attributed to the systemic lupus erythematosus. No evidence of significant right knee or ankle joint effusion. Incidental imaging of the left lower leg on the coronal images demonstrates no significant findings. Ligaments Not relevant for exam or indication. No ligamentous abnormalities are identified. Muscles and Tendons No intramuscular fluid collection or atrophy identified. The lower leg tendons appear intact without significant tenosynovitis. Soft tissues As seen on CT, there is a large soft tissue defect involving the anteromedial aspect of the distal right lower leg. There is underlying diffuse edema and ill-defined fluid throughout the adjacent subcutaneous fat. Just inferior to the wound, there is a small ill-defined collection which measures approximately 3.9 x 0.5 cm transverse (image 51/6). Superior to the wound, there is less well-defined fluid measuring 2.4 x 0.4 cm transverse on image 37/6. This extends approximately 5.2 cm in length on sagittal image 15/7. There is asymmetric mild generalized subcutaneous edema within the right lower leg relative to the left. As correlated with prior imaging, there are scattered subcutaneous calcifications. IMPRESSION: 1. Soft tissue defect involving the medial aspect of the right lower leg with subjacent inflammatory changes and ill-defined fluid collections consistent with soft tissue infection (cellulitis and probable small superficial abscesses). 2. No evidence of myositis or tenosynovitis. 3. No specific signs of osteomyelitis or septic arthritis. Electronically Signed   By: Carey Bullocks M.D.   On: 04/25/2022 10:06   CT EXTREMITY LOWER RIGHT W CONTRAST  Result Date: 04/24/2022 CLINICAL DATA:  Soft tissue infection suspected, lower leg. EXAM: CT OF THE LOWER RIGHT EXTREMITY WITH  CONTRAST TECHNIQUE: Multidetector CT imaging of the lower right extremity was performed according to the standard protocol following intravenous contrast administration. RADIATION DOSE REDUCTION: This exam was performed according to the departmental dose-optimization program which includes automated exposure control, adjustment of the mA and/or kV according to patient size and/or use of iterative reconstruction technique. CONTRAST:  OMNIPAQUE IOHEXOL 300 MG/ML  SOLN COMPARISON:  04/23/2022. FINDINGS: Bones/Joint/Cartilage No acute fracture or dislocation. Mild periosteal elevation along the medial aspect of the tibia in the region of the skin wound. No bony erosions are seen. There is a cystic lesion with surrounding sclerosis at the base of the proximal phalanx of the first digit. A trace joint effusion is noted at the knee. Ligaments Suboptimally assessed by CT. Muscles and Tendons Within normal limits.  No intramuscular abscess or edema. Soft tissues A soft tissue defect is seen along the medial aspect of the mid to distal tibia. A hypodense collection containing foci of air is present in the subcutaneous  soft tissues superior to the defect measuring 1.2 x 0.5 x 3.1 cm. Multiple calcifications are noted in the subcutaneous soft tissues. Mild subcutaneous edema is present over the dorsum of the foot. IMPRESSION: 1. Soft tissue defect along the medial aspect of the mid to distal tibia. There is a rim enhancing collection containing air superior to the lesion measuring 1.2 x 0.5 x 3.1 cm, suspicious for abscess. 2. Periosteal elevation involving the mid to distal tibia with no bony erosions, which may be reactive versus early osteomyelitis. Electronically Signed   By: Thornell Sartorius M.D.   On: 04/24/2022 03:42   DG Tibia/Fibula Right  Result Date: 04/23/2022 CLINICAL DATA:  Pain, wound infection in the skin EXAM: RIGHT TIBIA AND FIBULA - 2 VIEW COMPARISON:  01/24/2022 FINDINGS: No recent fracture or  dislocation is seen. There are no focal lytic lesions. There is soft tissue deformity along the medial aspect of right lower leg suggesting open wound in the skin. There are numerous calcifications in the soft tissues, possibly related to chronic inflammation. IMPRESSION: No fracture or dislocation is seen. There are no focal lytic lesions. If there is clinical suspicion for osteomyelitis, follow-up MRI may be considered. There is open wound in the skin along the medial aspect of lower portion of right lower leg suggesting infectious process in the soft tissues. Electronically Signed   By: Ernie Avena M.D.   On: 04/23/2022 20:26      LOS: 1 day    Jacquelin Hawking, MD [facility] 04/25/2022, 3:52 PM   If 7PM-7AM, please contact night-coverage [email or web address]

## 2022-04-25 NOTE — Plan of Care (Signed)
  Problem: Health Behavior/Discharge Planning: Goal: Ability to manage health-related needs will improve Outcome: Progressing   Problem: Clinical Measurements: Goal: Ability to maintain clinical measurements within normal limits will improve Outcome: Progressing   

## 2022-04-26 DIAGNOSIS — U071 COVID-19: Secondary | ICD-10-CM

## 2022-04-26 DIAGNOSIS — L02419 Cutaneous abscess of limb, unspecified: Secondary | ICD-10-CM | POA: Diagnosis not present

## 2022-04-26 DIAGNOSIS — L03119 Cellulitis of unspecified part of limb: Secondary | ICD-10-CM | POA: Diagnosis not present

## 2022-04-26 HISTORY — DX: COVID-19: U07.1

## 2022-04-26 LAB — AEROBIC/ANAEROBIC CULTURE W GRAM STAIN (SURGICAL/DEEP WOUND)

## 2022-04-26 LAB — CBC
HCT: 38 % (ref 36.0–46.0)
Hemoglobin: 12.9 g/dL (ref 12.0–15.0)
MCH: 30.4 pg (ref 26.0–34.0)
MCHC: 33.9 g/dL (ref 30.0–36.0)
MCV: 89.4 fL (ref 80.0–100.0)
Platelets: 312 10*3/uL (ref 150–400)
RBC: 4.25 MIL/uL (ref 3.87–5.11)
RDW: 13.6 % (ref 11.5–15.5)
WBC: 7.2 10*3/uL (ref 4.0–10.5)
nRBC: 0 % (ref 0.0–0.2)

## 2022-04-26 LAB — HEPARIN LEVEL (UNFRACTIONATED): Heparin Unfractionated: 0.58 IU/mL (ref 0.30–0.70)

## 2022-04-26 MED ORDER — ZINC SULFATE 220 (50 ZN) MG PO CAPS: 220.0000 mg | ORAL_CAPSULE | Freq: Every day | ORAL | Status: DC

## 2022-04-26 MED ORDER — AMOXICILLIN-POT CLAVULANATE 875-125 MG PO TABS: 1.0000 | ORAL_TABLET | Freq: Two times a day (BID) | ORAL | 0 refills | Status: AC

## 2022-04-26 MED ORDER — ASCORBIC ACID 500 MG PO TABS: 500.0000 mg | ORAL_TABLET | Freq: Every day | ORAL | Status: DC

## 2022-04-26 MED ORDER — AMOXICILLIN-POT CLAVULANATE 875-125 MG PO TABS
1.0000 | ORAL_TABLET | Freq: Two times a day (BID) | ORAL | Status: DC
  Administered 2022-04-26: 1 via ORAL
  Filled 2022-04-26: qty 1

## 2022-04-26 MED ORDER — ENOXAPARIN SODIUM 60 MG/0.6ML IJ SOSY
60.0000 mg | PREFILLED_SYRINGE | Freq: Once | INTRAMUSCULAR | Status: AC
Start: 2022-04-26 — End: 2022-04-26
  Administered 2022-04-26: 60 mg via SUBCUTANEOUS
  Filled 2022-04-26: qty 0.6

## 2022-04-26 MED ORDER — MOLNUPIRAVIR EUA 200MG CAPSULE: 4.0000 | ORAL_CAPSULE | Freq: Two times a day (BID) | ORAL | Status: DC

## 2022-04-26 MED ORDER — HYDROCODONE-ACETAMINOPHEN 5-325 MG PO TABS: 1.0000 | ORAL_TABLET | Freq: Four times a day (QID) | ORAL | 0 refills | Status: DC | PRN

## 2022-04-26 NOTE — Discharge Summary (Signed)
Physician Discharge Summary   Patient: Kelly [name] MRN: [id number] DOB: August 22, 1996  Admit date:     04/23/2022  Discharge date: 04/26/22  Discharge Physician: Jacquelin Hawkingalph Harveer Sadler, MD   PCP: Dione BoozePartridge, James, MD   Recommendations at discharge:  Follow-up with orthopedic surgery in 1 week Continue antibiotics Iodoform packing for wound management per orthopedic surgery  Discharge Diagnoses: Principal Problem:   Cellulitis and abscess of leg Active Problems:   Normocytic anemia   Systemic lupus erythematosus (HCC)   Chronic anticoagulation   COVID-19 virus infection  Resolved Problems:   * No resolved hospital problems. *  Hospital Course: Kelly [name] is a 26 y.o. female with a history of SLE and chronic ulcers who presented secondary to presented secondary to concern for infected wound/cellulitis.  Patient started on empiric Vancomycin with Ceftriaxone added to cover GNR.  Orthopedic surgery consulted for concern of abscess.  Incidentally, patient was found to have COVID 19 infection; known exposure. No surgical I&D required. Patient's cultures significant for E. Coli and Enterococcus faecalis with possible anaerobe. Patient transitioned to Augmentin for discharge.  Assessment and Plan:   Cellulitis and abscess of the right leg Acute infection on chronic wound.  CT scan significant for likely abscess.  Orthopedic surgery was consulted.  Patient empirically started on vancomycin. Superficial wound culture obtained in the ED and was significant for E. coli and Enterococcus species in addition to possible anaerobe.  Ceftriaxone added for E. Coli. MRI obtained and was significant for no evidence of osteomyelitis. Patient transitioned to Augmentin on discharge; two week course recommended by ID.   COVID-19 infection Patient is symptom with sore throat.  No clinical evidence of pneumonia.  No hypoxia.  Patient is high risk secondary to immunocompromise state in setting of SLE with  treatment.  Patient states that she is fully vaccinated. Started molnupiravir x5 days in addition to vitamin C and zinc.   Systemic lupus erythematosus Continue Imuran, prednisone, hydroxychloroquine. Patient managed on Heparin IV while inpatient and can transition back to Lovenox.   Normocytic anemia Mildly anemic with a hemoglobin of 11.7 g/dL.  Patient is a menstruating female.  No current evidence of acute bleeding.  Consultants: Orthopedic surgery (Dr. Lajoyce Cornersuda) Procedures performed: None  Disposition: Home Diet recommendation: Regular diet  DISCHARGE MEDICATION: Allergies as of 04/26/2022       Reactions   Doxycycline Hives, Rash   Blister patches on skin        Medication List     STOP taking these medications    acetaminophen 500 MG tablet Commonly known as: TYLENOL   oxyCODONE-acetaminophen 5-325 MG tablet Commonly known as: Percocet       TAKE these medications    amoxicillin-clavulanate 875-125 MG tablet Commonly known as: AUGMENTIN Take 1 tablet by mouth every 12 (twelve) hours for 14 days.   ascorbic acid 500 MG tablet Commonly known as: VITAMIN C Take 1 tablet (500 mg total) by mouth daily. Start taking on: April 27, 2022   azaTHIOprine 50 MG tablet Commonly known as: IMURAN Take 50 mg by mouth daily.   Calcium 600-D 600-10 MG-MCG Tabs Generic drug: Calcium Carb-Cholecalciferol 1 tablet with a meal Orally Once a day for 30 day(s)   DUODERM CGF EXTRA THIN EX Apply topically.   enoxaparin 60 MG/0.6ML injection Commonly known as: LOVENOX Inject 60 mg into the skin every 12 (twelve) hours.   HYDROcodone-acetaminophen 5-325 MG tablet Commonly known as: NORCO/VICODIN Take 1 tablet by mouth every 6 (six) hours as  needed for moderate pain.   hydroxychloroquine 200 MG tablet Commonly known as: PLAQUENIL Take 400 mg by mouth daily.   molnupiravir EUA 200 mg Caps capsule Commonly known as: LAGEVRIO Take 4 capsules (800 mg total) by mouth 2  (two) times daily. Complete bottle given to you at discharge.   predniSONE 5 MG tablet Commonly known as: DELTASONE Take 5-25 mg by mouth as directed. Taking 25 mg daily currently until on the  02-21-22 will take 20 mg for a week, then titrate down 5 mg each week until taking 5 mg daily.   zinc sulfate 220 (50 Zn) MG capsule Take 1 capsule (220 mg total) by mouth daily. Start taking on: April 27, 2022        Follow-up Information     Nadara Mustard, MD Follow up in 1 week(s).   Specialty: Orthopedic Surgery Contact information: [address] [place] [address] [phone]         Dione Booze, MD. Schedule an appointment as soon as possible for a visit in 1 week(s).   Specialty: Internal Medicine Why: For hospital follow-up Contact information: [address] [place] [address] [phone]                Discharge Exam: BP 111/78 (BP Location: Right Arm)   Pulse 95   Temp 98.7 F (37.1 C)   Resp 16   Ht 5\' 3"  (1.6 m)   Wt 62.1 kg   SpO2 100%   BMI 24.27 kg/m   General exam: Appears calm and comfortable Respiratory system: Respiratory effort normal. Central nervous system: Alert and oriented. No focal neurological deficits. Psychiatry: Judgement and insight appear normal. Mood & affect appropriate.   Condition at discharge: stable  The results of significant diagnostics from this hospitalization (including imaging, microbiology, ancillary and laboratory) are listed below for reference.   Imaging Studies: MR TIBIA FIBULA RIGHT WO CONTRAST  Result Date: 04/25/2022 CLINICAL DATA:  Anterior right lower leg wound. History of systemic lupus erythematosus. Soft tissue infection suspected. EXAM: MRI OF LOWER RIGHT EXTREMITY WITHOUT CONTRAST TECHNIQUE: Multiplanar, multisequence MR imaging of the right lower leg was performed. No intravenous contrast was administered. COMPARISON:  Radiographs 04/23/2022.  CT 04/24/2022. FINDINGS:  Bones/Joint/Cartilage No cortical destruction or suspicious marrow signal within the right tibia or fibula. As suggested on CT, there may be a small amount of tibial periosteal reaction along the posterior aspect of the wound, although this is difficult to assess in this patient with multiple underlying soft tissue calcifications attributed to the systemic lupus erythematosus. No evidence of significant right knee or ankle joint effusion. Incidental imaging of the left lower leg on the coronal images demonstrates no significant findings. Ligaments Not relevant for exam or indication. No ligamentous abnormalities are identified. Muscles and Tendons No intramuscular fluid collection or atrophy identified. The lower leg tendons appear intact without significant tenosynovitis. Soft tissues As seen on CT, there is a large soft tissue defect involving the anteromedial aspect of the distal right lower leg. There is underlying diffuse edema and ill-defined fluid throughout the adjacent subcutaneous fat. Just inferior to the wound, there is a small ill-defined collection which measures approximately 3.9 x 0.5 cm transverse (image 51/6). Superior to the wound, there is less well-defined fluid measuring 2.4 x 0.4 cm transverse on image 37/6. This extends approximately 5.2 cm in length on sagittal image 15/7. There is asymmetric mild generalized subcutaneous edema within the right lower leg relative to the left. As correlated  with prior imaging, there are scattered subcutaneous calcifications. IMPRESSION: 1. Soft tissue defect involving the medial aspect of the right lower leg with subjacent inflammatory changes and ill-defined fluid collections consistent with soft tissue infection (cellulitis and probable small superficial abscesses). 2. No evidence of myositis or tenosynovitis. 3. No specific signs of osteomyelitis or septic arthritis. Electronically Signed   By: Carey Bullocks M.D.   On: 04/25/2022 10:06   CT EXTREMITY  LOWER RIGHT W CONTRAST  Result Date: 04/24/2022 CLINICAL DATA:  Soft tissue infection suspected, lower leg. EXAM: CT OF THE LOWER RIGHT EXTREMITY WITH CONTRAST TECHNIQUE: Multidetector CT imaging of the lower right extremity was performed according to the standard protocol following intravenous contrast administration. RADIATION DOSE REDUCTION: This exam was performed according to the departmental dose-optimization program which includes automated exposure control, adjustment of the mA and/or kV according to patient size and/or use of iterative reconstruction technique. CONTRAST:  OMNIPAQUE IOHEXOL 300 MG/ML  SOLN COMPARISON:  04/23/2022. FINDINGS: Bones/Joint/Cartilage No acute fracture or dislocation. Mild periosteal elevation along the medial aspect of the tibia in the region of the skin wound. No bony erosions are seen. There is a cystic lesion with surrounding sclerosis at the base of the proximal phalanx of the first digit. A trace joint effusion is noted at the knee. Ligaments Suboptimally assessed by CT. Muscles and Tendons Within normal limits.  No intramuscular abscess or edema. Soft tissues A soft tissue defect is seen along the medial aspect of the mid to distal tibia. A hypodense collection containing foci of air is present in the subcutaneous soft tissues superior to the defect measuring 1.2 x 0.5 x 3.1 cm. Multiple calcifications are noted in the subcutaneous soft tissues. Mild subcutaneous edema is present over the dorsum of the foot. IMPRESSION: 1. Soft tissue defect along the medial aspect of the mid to distal tibia. There is a rim enhancing collection containing air superior to the lesion measuring 1.2 x 0.5 x 3.1 cm, suspicious for abscess. 2. Periosteal elevation involving the mid to distal tibia with no bony erosions, which may be reactive versus early osteomyelitis. Electronically Signed   By: Thornell Sartorius M.D.   On: 04/24/2022 03:42   DG Tibia/Fibula Right  Result Date:  04/23/2022 CLINICAL DATA:  Pain, wound infection in the skin EXAM: RIGHT TIBIA AND FIBULA - 2 VIEW COMPARISON:  01/24/2022 FINDINGS: No recent fracture or dislocation is seen. There are no focal lytic lesions. There is soft tissue deformity along the medial aspect of right lower leg suggesting open wound in the skin. There are numerous calcifications in the soft tissues, possibly related to chronic inflammation. IMPRESSION: No fracture or dislocation is seen. There are no focal lytic lesions. If there is clinical suspicion for osteomyelitis, follow-up MRI may be considered. There is open wound in the skin along the medial aspect of lower portion of right lower leg suggesting infectious process in the soft tissues. Electronically Signed   By: Ernie Avena M.D.   On: 04/23/2022 20:26    Microbiology: Results for orders placed or performed during the hospital encounter of 04/23/22  Aerobic/Anaerobic Culture w Gram Stain (surgical/deep wound)     Status: None (Preliminary result)   Collection Time: 04/23/22  4:04 PM   Specimen: Wound  Result Value Ref Range Status   Specimen Description   Final    WOUND Performed at [facility], [address] [place], [place] [address]    Special Requests   Final  NONE RIGHT MEDLI Performed at [facility], [address], [place], [place] [address]    Gram Stain   Final    FEW WBC PRESENT, PREDOMINANTLY PMN MODERATE GRAM POSITIVE COCCI IN PAIRS FEW GRAM NEGATIVE RODS    Culture   Final    ABUNDANT ESCHERICHIA COLI MODERATE ENTEROCOCCUS FAECALIS HOLDING FOR POSSIBLE ANAEROBE Performed at [facility], [address] [place], [place] [address]    Report Status PENDING  Incomplete   Organism ID, Bacteria ESCHERICHIA COLI  Final   Organism ID, Bacteria ENTEROCOCCUS FAECALIS  Final      Susceptibility   Escherichia coli - MIC*    AMPICILLIN <=2 SENSITIVE Sensitive     CEFAZOLIN <=4 SENSITIVE Sensitive      CEFEPIME <=0.12 SENSITIVE Sensitive     CEFTAZIDIME <=1 SENSITIVE Sensitive     CEFTRIAXONE <=0.25 SENSITIVE Sensitive     CIPROFLOXACIN <=0.25 SENSITIVE Sensitive     GENTAMICIN <=1 SENSITIVE Sensitive     IMIPENEM <=0.25 SENSITIVE Sensitive     TRIMETH/SULFA <=20 SENSITIVE Sensitive     AMPICILLIN/SULBACTAM <=2 SENSITIVE Sensitive     PIP/TAZO <=4 SENSITIVE Sensitive     * ABUNDANT ESCHERICHIA COLI   Enterococcus faecalis - MIC*    AMPICILLIN <=2 SENSITIVE Sensitive     VANCOMYCIN 1 SENSITIVE Sensitive     GENTAMICIN SYNERGY SENSITIVE Sensitive     * MODERATE ENTEROCOCCUS FAECALIS  Blood culture (routine x 2)     Status: None (Preliminary result)   Collection Time: 04/23/22  7:58 PM   Specimen: BLOOD  Result Value Ref Range Status   Specimen Description BLOOD LEFT ANTECUBITAL  Final   Special Requests   Final    BOTTLES DRAWN AEROBIC AND ANAEROBIC Blood Culture adequate volume   Culture   Final    NO GROWTH 3 DAYS Performed at [facility], [address], [place], [place] [address]    Report Status PENDING  Incomplete  Blood culture (routine x 2)     Status: None (Preliminary result)   Collection Time: 04/23/22  7:58 PM   Specimen: BLOOD  Result Value Ref Range Status   Specimen Description BLOOD RIGHT ANTECUBITAL  Final   Special Requests   Final    BOTTLES DRAWN AEROBIC AND ANAEROBIC Blood Culture adequate volume   Culture   Final    NO GROWTH 3 DAYS Performed at [facility], [address] [place], [place] [address]    Report Status PENDING  Incomplete  SARS Coronavirus 2 by RT PCR (hospital order, performed in [facility] hospital lab) *cepheid single result test* Anterior Nasal Swab     Status: Abnormal   Collection Time: 04/24/22 10:33 AM   Specimen: Anterior Nasal Swab  Result Value Ref Range Status   SARS Coronavirus 2 by RT PCR POSITIVE (A) NEGATIVE Final    Comment: (NOTE) SARS-CoV-2 target nucleic acids are DETECTED  SARS-CoV-2 RNA is  generally detectable in upper respiratory specimens  during the acute phase of infection.  Positive results are indicative  of the presence of the identified virus, but do not rule out bacterial infection or co-infection with other pathogens not detected by the test.  Clinical correlation with patient history and  other diagnostic information is necessary to determine patient infection status.  The expected result is negative.  Fact Sheet for Patients:   RoadLapTop.co.za   Fact Sheet for Healthcare Providers:   http://kim-miller.com/    This test is  not yet approved or cleared by the [place] and  has been authorized for detection and/or diagnosis of SARS-CoV-2 by FDA under an Emergency Use Authorization (EUA).  This EUA will remain in effect (meaning this test can be used) for the duration of  the COVID-19 declaration under Section 564(b)(1)  of the Act, 21 U.S.C. section 360-bbb-3(b)(1), unless the authorization is terminated or revoked sooner.   Performed at [facility], [address], [place], [place] [address]     Labs: CBC: Recent Labs  Lab 04/23/22 1958 04/24/22 1608 04/25/22 0805 04/26/22 0451  WBC 10.1 8.5 6.3 7.2  NEUTROABS 7.5 5.5  --   --   HGB 11.7* 11.6* 11.6* 12.9  HCT 35.2* 37.0 35.1* 38.0  MCV 93.1 97.4 91.4 89.4  PLT 302 243 273 312   Basic Metabolic Panel: Recent Labs  Lab 04/23/22 1958 04/24/22 1608 04/25/22 0805  NA 136 140 137  K 4.4 4.4 3.8  CL 106 110 104  CO2 23 20* 22  GLUCOSE 102* 69* 92  BUN 9 8 7   CREATININE 0.76 0.73 0.70  CALCIUM 9.0 8.8* 9.2   Liver Function Tests: Recent Labs  Lab 04/23/22 1958 04/25/22 0805  AST 32 18  ALT 29 26  ALKPHOS 51 39  BILITOT 0.7 0.5  PROT 8.4* 7.5  ALBUMIN 3.1* 2.7*   CBG: Recent Labs  Lab 04/25/22 0814  GLUCAP 82    Discharge time spent: 35 minutes.  Signed: 04/27/22, MD [facility] 04/26/2022

## 2022-04-26 NOTE — Progress Notes (Signed)
Patient ID: Kelly [name], female   DOB: Mar 24, 1996, 26 y.o.   MRN: [id number] Patient is seen in follow-up for the abscess right lower extremity.  Review of the MRI scan did show a fluid collection medially that was communicating with the open wound.  The lateral calf did show cellulitis but no abscess.  Examination today the abscess is decompressing through the inferior chronic wound this is packed open with iodoform gauze.  There is a new wound on the proximal aspect of where the abscess was in this has also helped decompress the abscess.  The lateral area of the calf is less tender with no fluctuance still consistent with cellulitis laterally.  With the improvement and drainage of the abscess medially I do not feel that patient requires any type of surgical decompression.  Continue with the current wound care and antibiotics.  Cultures are showing E. coli, Enterococcus is faecalis.  Anticipate discharge on antibiotics as per infectious disease recommendations.  I will follow-up in the office 1 week after discharge.

## 2022-04-26 NOTE — Discharge Instructions (Addendum)
Kelly [name],  You were here with a wound infection with abscess. Thankfully you did not need surgery, but please continue antibiotics as prescribed in addition to continuing your wound care. You were also found to have a COVID-19 infection without significant disease. Please continue your molnupiravir. Please isolate for 5 days.

## 2022-04-26 NOTE — Progress Notes (Signed)
ANTICOAGULATION CONSULT NOTE - Follow Up Consult  Pharmacy Consult for heparin  Indication:  patient has a history of Lupus and thrombotic lesions   Allergies  Allergen Reactions   Doxycycline Hives and Rash    Blister patches on skin    Patient Measurements: Height: 5\' 3"  (160 cm) Weight: 62.1 kg (137 lb) IBW/kg (Calculated) : 52.4 Heparin Dosing Weight: 62.1kg   Vital Signs: Temp: 98.4 F (36.9 C) (08/31 0500) Temp Source: Oral (08/31 0500) BP: 120/80 (08/31 0500) Pulse Rate: 82 (08/31 0500)  Labs: Recent Labs    04/23/22 1958 04/24/22 1608 04/24/22 1713 04/25/22 0805 04/25/22 1448 04/26/22 0451  HGB 11.7* 11.6*  --  11.6*  --  12.9  HCT 35.2* 37.0  --  35.1*  --  38.0  PLT 302 243  --  273  --  312  HEPARINUNFRC  --   --    < > 0.25* 0.44 0.58  CREATININE 0.76 0.73  --  0.70  --   --    < > = values in this interval not displayed.     Estimated Creatinine Clearance: 87.4 mL/min (by C-G formula based on SCr of 0.7 mg/dL).  Assessment: Patient is currently admitted for lower leg wound infection with potential I & D. Patient is maintained on Lovenox 60mg  BID at home for treatment of thrombotic lesions secondary to Lupus.Last dose of enoxaparin on 8/28. Due to likely upcoming procedure switched to heparin for management. Pharmacy has been consulted to dose IV heparin.  Heparin level therapeutic at 0.58 on heparin 1250 units/hr. CBC stable. No issues with heparin infusion or signs of bleeding per RN.   Goal of Therapy:  Heparin level 0.3-0.7 units/ml Monitor platelets by anticoagulation protocol: Yes   Plan:  Continue heparin drip at 1250 units/hr Check heparin level in AM Monitor daily heparin level and CBC  F/u OR plans and ability to transition back to Lovenox  BS, PharmD, BCPS Clinical Pharmacist 04/26/2022 7:30 AM  Contact: [phone] after 3 PM  "Be curious, not judgmental..." -04/28/2022

## 2022-04-26 NOTE — TOC Transition Note (Signed)
Transition of Care [facility]) - CM/SW Discharge Note   Patient Details  Name: Kelly [name] MRN: [id number] Date of Birth: 02-13-1996  [facility]) CM/SW Contact:  Tom-Johnson, Hershal Coria, RN Phone Number: 04/26/2022, 1:43 PM   Clinical Narrative:     Patient is scheduled for discharge today. No Surgical intervention noted. Transitioned to Oral abx at discharge. No TOC needs or recommendations noted. Family to transport at discharge. No further TOC needs noted.    Final next level of care: Home/Self Care Barriers to Discharge: Barriers Resolved   Patient Goals and CMS Choice Patient states their goals for this hospitalization and ongoing recovery are:: To return home CMS Medicare.gov Compare Post Acute Care list provided to:: Patient Choice offered to / list presented to : NA  Discharge Placement                Patient to be transferred to facility by: Family      Discharge Plan and Services                DME Arranged: N/A DME Agency: NA       HH Arranged: NA HH Agency: NA        Social Determinants of Health (SDOH) Interventions     Readmission Risk Interventions     No data to display

## 2022-04-27 NOTE — Progress Notes (Signed)
Kelly [name] ([id number]) Visit Report for 04/23/2022 Arrival Information Details Patient Name: Date of Service: Kelly [name] 04/23/2022 3:15 PM Medical Record Number: [id number] Patient Account Number: [id number] Date of Birth/Sex: Treating RN: 1995-12-31 (26 y.o. Kelly [name], [name] Primary Care [name]: [name] Other Clinician: Referring Adilen Pavelko: Treating Akasia Ahmad/Extender: Elsworth Soho in Treatment: 62 Visit Information History Since Last Visit Added or deleted any medications: No Patient Arrived: Ambulatory Any new allergies or adverse reactions: No Arrival Time: 15:49 Had a fall or experienced change in No Accompanied By: self activities of daily living that may affect Transfer Assistance: None risk of falls: Patient Identification Verified: Yes Signs or symptoms of abuse/neglect since last visito No Secondary Verification Process Completed: Yes Hospitalized since last visit: No Patient Requires Transmission-Based Precautions: No Implantable device outside of the clinic excluding No Patient Has Alerts: Yes cellular tissue based products placed in the center Patient Alerts: Left ABi=1.18 since last visit: Has Dressing in Place as Prescribed: Yes Pain Present Now: Yes Electronic Signature(s) Signed: 04/27/2022 1:10:23 PM By: Rhae Hammock RN Entered By: Rhae Hammock on 04/23/2022 15:52:16 -------------------------------------------------------------------------------- Clinic Level of Care Assessment Details Patient Name: Date of Service: Kelly [name] 04/23/2022 3:15 PM Medical Record Number: [id number] Patient Account Number: [id number] Date of Birth/Sex: Treating RN: 1996/04/09 (26 y.o. Kelly [name], [name] Primary Care [name]: [name] Other Clinician: Referring Steffani Dionisio: Treating Hartley Urton/Extender: Elsworth Soho in Treatment: 62 Clinic Level of Care Assessment  Items TOOL 4 Quantity Score X- 1 0 Use when only an EandM is performed on FOLLOW-UP visit ASSESSMENTS - Nursing Assessment / Reassessment X- 1 10 Reassessment of Co-morbidities (includes updates in patient status) X- 1 5 Reassessment of Adherence to Treatment Plan ASSESSMENTS - Wound and Skin A ssessment / Reassessment []  - 0 Simple Wound Assessment / Reassessment - one wound X- 2 5 Complex Wound Assessment / Reassessment - multiple wounds []  - 0 Dermatologic / Skin Assessment (not related to wound area) ASSESSMENTS - Focused Assessment X- 2 5 Circumferential Edema Measurements - multi extremities []  - 0 Nutritional Assessment / Counseling / Intervention []  - 0 Lower Extremity Assessment (monofilament, tuning fork, pulses) []  - 0 Peripheral Arterial Disease Assessment (using hand held doppler) ASSESSMENTS - Ostomy and/or Continence Assessment and Care []  - 0 Incontinence Assessment and Management []  - 0 Ostomy Care Assessment and Management (repouching, etc.) PROCESS - Coordination of Care []  - 0 Simple Patient / Family Education for ongoing care X- 1 20 Complex (extensive) Patient / Family Education for ongoing care X- 1 10 Staff obtains Programmer, systems, Records, T Results / Process Orders est []  - 0 Staff telephones HHA, Nursing Homes / Clarify orders / etc []  - 0 Routine Transfer to another Facility (non-emergent condition) []  - 0 Routine Hospital Admission (non-emergent condition) []  - 0 New Admissions / Biomedical engineer / Ordering NPWT Apligraf, etc. , []  - 0 Emergency Hospital Admission (emergent condition) []  - 0 Simple Discharge Coordination X- 1 15 Complex (extensive) Discharge Coordination PROCESS - Special Needs []  - 0 Pediatric / Minor Patient Management []  - 0 Isolation Patient Management []  - 0 Hearing / Language / Visual special needs []  - 0 Assessment of Community assistance (transportation, D/C planning, etc.) []  - 0 Additional  assistance / Altered mentation []  - 0 Support Surface(s) Assessment (bed, cushion, seat, etc.) INTERVENTIONS - Wound Cleansing / Measurement []  - 0 Simple Wound Cleansing - one wound X- 2 5 Complex Wound Cleansing -  multiple wounds X- 1 5 Wound Imaging (photographs - any number of wounds) $RemoveBe'[]'hJuOLAUAY$  - 0 Wound Tracing (instead of photographs) $RemoveBeforeD'[]'gLSZPpfWvekaZk$  - 0 Simple Wound Measurement - one wound X- 2 5 Complex Wound Measurement - multiple wounds INTERVENTIONS - Wound Dressings $RemoveBeforeD'[]'sYFcjBfXFyTNBF$  - 0 Small Wound Dressing one or multiple wounds X- 2 15 Medium Wound Dressing one or multiple wounds $RemoveBeforeD'[]'kLfMYglULUAqvW$  - 0 Large Wound Dressing one or multiple wounds X- 1 5 Application of Medications - topical $RemoveB'[]'cgwCRZdB$  - 0 Application of Medications - injection INTERVENTIONS - Miscellaneous $RemoveBeforeD'[]'boXvbvudTJCQGi$  - 0 External ear exam $Remove'[]'qShjUnH$  - 0 Specimen Collection (cultures, biopsies, blood, body fluids, etc.) $RemoveBefor'[]'vXFfiBFUvwtQ$  - 0 Specimen(s) / Culture(s) sent or taken to Lab for analysis $RemoveBefo'[]'RFTzrmgjPQb$  - 0 Patient Transfer (multiple staff / Civil Service fast streamer / Similar devices) $RemoveBeforeDE'[]'HqGSOkrCIRLYnAu$  - 0 Simple Staple / Suture removal (25 or less) $Remove'[]'xLIZSmY$  - 0 Complex Staple / Suture removal (26 or more) $Remove'[]'cThSiHV$  - 0 Hypo / Hyperglycemic Management (close monitor of Blood Glucose) $RemoveBefore'[]'yMyDjPnnHRAzm$  - 0 Ankle / Brachial Index (ABI) - do not check if billed separately X- 1 5 Vital Signs Has the patient been seen at the hospital within the last three years: Yes Total Score: 145 Level Of Care: New/Established - Level 4 Electronic Signature(s) Signed: 04/27/2022 1:10:23 PM By: Rhae Hammock RN Entered By: Rhae Hammock on 04/23/2022 16:20:41 -------------------------------------------------------------------------------- Encounter Discharge Information Details Patient Name: Date of Service: Kelly Lou [name] 04/23/2022 3:15 PM Medical Record Number: [id number] Patient Account Number: [id number] Date of Birth/Sex: Treating RN: [birth year] (26 y.o. Kelly [name], [name] Primary Care Tyren Dugar: [name] Other  Clinician: Referring Norwin Aleman: Treating Analise Glotfelty/Extender: Elsworth Soho in Treatment: 68 Encounter Discharge Information Items Discharge Condition: Stable Ambulatory Status: Ambulatory Discharge Destination: Home Transportation: Private Auto Accompanied By: self Schedule Follow-up Appointment: Yes Clinical Summary of Care: Patient Declined Electronic Signature(s) Signed: 04/27/2022 1:10:23 PM By: Rhae Hammock RN Entered By: Rhae Hammock on 04/23/2022 16:22:20 -------------------------------------------------------------------------------- Lower Extremity Assessment Details Patient Name: Date of Service: Kelly Lou RIA Cipriano Bunker. 04/23/2022 3:15 PM Medical Record Number: [id number] Patient Account Number: [id number] Date of Birth/Sex: Treating RN: Jan 06, 1996 (26 y.o. Kelly [name], [name] Primary Care Jamonte Curfman: [name] Other Clinician: Referring Eman Rynders: Treating Inna Tisdell/Extender: Elsworth Soho in Treatment: 62 Edema Assessment Assessed: Shirlyn Goltz: No] Patrice Paradise: Yes] Edema: [Left: Yes] [Right: Yes] Calf Left: Right: Point of Measurement: 27 cm From Medial Instep 33 cm Ankle Left: Right: Point of Measurement: 8 cm From Medial Instep 23 cm Vascular Assessment Pulses: Dorsalis Pedis Palpable: [Right:Yes] Posterior Tibial Palpable: [Right:Yes] Electronic Signature(s) Signed: 04/27/2022 1:10:23 PM By: Rhae Hammock RN Entered By: Rhae Hammock on 04/23/2022 15:54:36 -------------------------------------------------------------------------------- Multi Wound Chart Details Patient Name: Date of Service: Kelly Lou RIA Cipriano Bunker. 04/23/2022 3:15 PM Medical Record Number: [id number] Patient Account Number: [id number] Date of Birth/Sex: Treating RN: [birth year] (26 y.o. Kelly [name], [name] Primary Care Heidemarie Goodnow: [name] Other Clinician: Referring Zahli Vetsch: Treating Barlow Harrison/Extender: Elsworth Soho in Treatment: 62 Vital Signs Height(in): 62 Pulse(bpm): 109 Weight(lbs): Blood Pressure(mmHg): 121/89 Body Mass Index(BMI): Temperature(F): 98.7 Respiratory Rate(breaths/min): 17 Photos: [N/A:N/A] Right, Medial Lower Leg Right, Anterior Lower Leg N/A Wound Location: Gradually Appeared Gradually Appeared N/A Wounding Event: Lupus Auto-immune N/A Primary Etiology: Asthma, Lupus Erythematosus Asthma, Lupus Erythematosus N/A Comorbid History: 12/18/2021 03/19/2022 N/A Date Acquired: 18 5 N/A Weeks of Treatment: Open Open N/A Wound Status: No No N/A Wound Recurrence: Yes No N/A Clustered Wound: 1 N/A N/A Clustered Quantity: 3.4x3x0.7 0.6x0.7x0.3 N/A Measurements L  x W x D (cm) 8.011 0.33 N/A A (cm) : rea 5.608 0.099 N/A Volume (cm) : 35.20% 41.60% N/A % Reduction in A rea: -51.10% 12.40% N/A % Reduction in Volume: Full Thickness With Exposed Support Full Thickness Without Exposed N/A Classification: Structures Support Structures Medium Medium N/A Exudate Amount: Serosanguineous Serosanguineous N/A Exudate Type: red, brown red, brown N/A Exudate Color: Well defined, not attached Epibole N/A Wound Margin: Large (67-100%) Small (1-33%) N/A Granulation Amount: Red, Pink Red N/A Granulation Quality: Small (1-33%) Large (67-100%) N/A Necrotic Amount: Fat Layer (Subcutaneous Tissue): Yes Fat Layer (Subcutaneous Tissue): Yes N/A Exposed Structures: Fascia: No Tendon: No Muscle: No Joint: No Bone: No Small (1-33%) None N/A Epithelialization: Treatment Notes Electronic Signature(s) Signed: 04/23/2022 4:36:36 PM By: Kalman Shan DO Signed: 04/27/2022 1:10:23 PM By: Rhae Hammock RN Entered By: Kalman Shan on 04/23/2022 16:11:09 -------------------------------------------------------------------------------- Multi-Disciplinary Care Plan Details Patient Name: Date of Service: Kelly Lou [name] 04/23/2022 3:15  PM Medical Record Number: [id number] Patient Account Number: [id number] Date of Birth/Sex: Treating RN: 17-Nov-1995 (26 y.o. Kelly [name], [name] Primary Care Dandrea Widdowson: [name] Other Clinician: Referring Cathan Gearin: Treating Lailoni Baquera/Extender: Elsworth Soho in Treatment: 62 Multidisciplinary Care Plan reviewed with physician Active Inactive Wound/Skin Impairment Nursing Diagnoses: Impaired tissue integrity Goals: Patient/caregiver will verbalize understanding of skin care regimen Date Initiated: 02/13/2021 Target Resolution Date: 04/27/2022 Goal Status: Active Ulcer/skin breakdown will have a volume reduction of 30% by week 4 Date Initiated: 02/13/2021 Date Inactivated: 04/21/2021 Target Resolution Date: 03/13/2021 Unmet Reason: pt did not return to Goal Status: Unmet clinic for F/U Ulcer/skin breakdown will have a volume reduction of 80% by week 12 Date Initiated: 04/21/2021 Date Inactivated: 07/13/2021 Target Resolution Date: 07/27/2021 Goal Status: Met Interventions: Assess patient/caregiver ability to obtain necessary supplies Assess patient/caregiver ability to perform ulcer/skin care regimen upon admission and as needed Assess ulceration(s) every visit Provide education on ulcer and skin care Treatment Activities: Skin care regimen initiated : 02/13/2021 Topical wound management initiated : 02/13/2021 Notes: 06/29/21: Wounds 3 and 6 only at 80% volume reduction. Target date extended. Electronic Signature(s) Signed: 04/27/2022 1:10:23 PM By: Rhae Hammock RN Entered By: Rhae Hammock on 04/23/2022 16:17:17 -------------------------------------------------------------------------------- Pain Assessment Details Patient Name: Date of Service: Kelly Lou RIA Cipriano Bunker. 04/23/2022 3:15 PM Medical Record Number: [id number] Patient Account Number: [id number] Date of Birth/Sex: Treating RN: August 06, 1996 (26 y.o. Kelly [name], [name] Primary Care  Gwenda Heiner: [name] Other Clinician: Referring Fredrick Geoghegan: Treating Serenitie Vinton/Extender: Elsworth Soho in Treatment: 62 Active Problems Location of Pain Severity and Description of Pain Patient Has Paino Yes Site Locations Pain Location: Pain in Ulcers With Dressing Change: Yes Duration of the Pain. Constant / Intermittento Constant Rate the pain. Current Pain Level: 10 Worst Pain Level: 10 Least Pain Level: 0 Tolerable Pain Level: 10 Character of Pain Describe the Pain: Aching Pain Management and Medication Current Pain Management: Medication: No Cold Application: No Rest: No Massage: No Activity: No T.E.N.S.: No Heat Application: No Leg drop or elevation: No Is the Current Pain Management Adequate: Adequate How does your wound impact your activities of daily livingo Sleep: No Bathing: No Appetite: No Relationship With Others: No Bladder Continence: No Emotions: No Bowel Continence: No Work: No Toileting: No Drive: No Dressing: No Hobbies: No Electronic Signature(s) Signed: 04/27/2022 1:10:23 PM By: Rhae Hammock RN Entered By: Rhae Hammock on 04/23/2022 15:53:16 -------------------------------------------------------------------------------- Patient/Caregiver Education Details Patient Name: Date of Service: Fransico Setters 8/28/2023andnbsp3:15 PM Medical Record Number: [id number]  Patient Account Number: [id number] Date of Birth/Gender: Treating RN: Jun 12, 1996 (26 y.o. Kelly [name], [name] Primary Care Physician: [name] Other Clinician: Referring Physician: Treating Physician/Extender: Elsworth Soho in Treatment: 62 Education Assessment Education Provided To: Patient Education Topics Provided Wound/Skin Impairment: Methods: Explain/Verbal Responses: Reinforcements needed, State content correctly Electronic Signature(s) Signed: 04/27/2022 1:10:23 PM By: Rhae Hammock  RN Entered By: Rhae Hammock on 04/23/2022 16:17:30 -------------------------------------------------------------------------------- Wound Assessment Details Patient Name: Date of Service: Kelly Lou RIA Cipriano Bunker. 04/23/2022 3:15 PM Medical Record Number: [id number] Patient Account Number: [id number] Date of Birth/Sex: Treating RN: August 21, 1996 (26 y.o. Kelly [name], [name] Primary Care Nirvaan Frett: [name] Other Clinician: Referring Jamye Balicki: Treating Serenitie Vinton/Extender: Elsworth Soho in Treatment: 62 Wound Status Wound Number: 10 Primary Etiology: Lupus Wound Location: Right, Medial Lower Leg Wound Status: Open Wounding Event: Gradually Appeared Comorbid History: Asthma, Lupus Erythematosus Date Acquired: 12/18/2021 Weeks Of Treatment: 18 Clustered Wound: Yes Photos Wound Measurements Length: (cm) 3.4 Width: (cm) 3 Depth: (cm) 0.7 Clustered Quantity: 1 Area: (cm) 8.011 Volume: (cm) 5.608 % Reduction in Area: 35.2% % Reduction in Volume: -51.1% Epithelialization: Small (1-33%) Wound Description Classification: Full Thickness With Exposed Support Structures Wound Margin: Well defined, not attached Exudate Amount: Medium Exudate Type: Serosanguineous Exudate Color: red, brown Foul Odor After Cleansing: No Slough/Fibrino Yes Wound Bed Granulation Amount: Large (67-100%) Exposed Structure Granulation Quality: Red, Pink Fascia Exposed: No Necrotic Amount: Small (1-33%) Fat Layer (Subcutaneous Tissue) Exposed: Yes Necrotic Quality: Adherent Slough Tendon Exposed: No Muscle Exposed: No Joint Exposed: No Bone Exposed: No Treatment Notes Wound #10 (Lower Leg) Wound Laterality: Right, Medial Cleanser Soap and Water Discharge Instruction: May shower and wash wound with dial antibacterial soap and water prior to dressing change. Wound Cleanser Discharge Instruction: Cleanse the wound with wound cleanser prior to applying a clean  dressing using gauze sponges, not tissue or cotton balls. Peri-Wound Care Topical Primary Dressing Dakin's Solution 0.25%, 16 (oz) Discharge Instruction: Moisten gauze with Dakin's solution Secondary Dressing Bordered Gauze, 4x4 in Discharge Instruction: Apply over primary dressing as directed. Secured With [employer], 4.5x3.1 (in/yd) Discharge Instruction: Secure with Kerlix as directed. Stretch Net Size 5, 10 (yds) Compression Wrap Compression Stockings Add-Ons Electronic Signature(s) Signed: 04/27/2022 1:10:23 PM By: Rhae Hammock RN Entered By: Rhae Hammock on 04/23/2022 16:07:04 -------------------------------------------------------------------------------- Wound Assessment Details Patient Name: Date of Service: Kelly Lou RIA Cipriano Bunker. 04/23/2022 3:15 PM Medical Record Number: [id number] Patient Account Number: [id number] Date of Birth/Sex: Treating RN: 1996-08-15 (26 y.o. Kelly [name], [name] Primary Care Alonna Bartling: [name] Other Clinician: Referring Yanique Mulvihill: Treating Dana Debo/Extender: Elsworth Soho in Treatment: 62 Wound Status Wound Number: 12 Primary Etiology: Auto-immune Wound Location: Right, Anterior Lower Leg Wound Status: Open Wounding Event: Gradually Appeared Comorbid History: Asthma, Lupus Erythematosus Date Acquired: 03/19/2022 Weeks Of Treatment: 5 Clustered Wound: No Photos Wound Measurements Length: (cm) 0.6 Width: (cm) 0.7 Depth: (cm) 0.3 Area: (cm) 0.33 Volume: (cm) 0.099 % Reduction in Area: 41.6% % Reduction in Volume: 12.4% Epithelialization: None Tunneling: No Undermining: No Wound Description Classification: Full Thickness Without Exposed Support Structures Wound Margin: Epibole Exudate Amount: Medium Exudate Type: Serosanguineous Exudate Color: red, brown Foul Odor After Cleansing: No Slough/Fibrino Yes Wound Bed Granulation Amount: Small (1-33%) Exposed  Structure Granulation Quality: Red Fat Layer (Subcutaneous Tissue) Exposed: Yes Necrotic Amount: Large (67-100%) Necrotic Quality: Adherent Slough Treatment Notes Wound #12 (Lower Leg) Wound Laterality: Right, Anterior Cleanser Soap and Water Discharge Instruction: May shower and wash wound with  dial antibacterial soap and water prior to dressing change. Wound Cleanser Discharge Instruction: Cleanse the wound with wound cleanser prior to applying a clean dressing using gauze sponges, not tissue or cotton balls. Peri-Wound Care Topical Primary Dressing MediHoney Gel, tube 1.5 (oz) Discharge Instruction: Apply to wound bed as instructed Secondary Dressing Woven Gauze Sponge, Non-Sterile 4x4 in Discharge Instruction: Apply over primary dressing as directed. Secured With [employer], 4.5x3.1 (in/yd) Discharge Instruction: Secure with Kerlix as directed. Stretch Net Size 5, 10 (yds) Compression Wrap Compression Stockings Add-Ons Electronic Signature(s) Signed: 04/27/2022 1:10:23 PM By: Rhae Hammock RN Entered By: Rhae Hammock on 04/23/2022 16:06:30 -------------------------------------------------------------------------------- Vitals Details Patient Name: Date of Service: Kelly Lou [name] 04/23/2022 3:15 PM Medical Record Number: [id number] Patient Account Number: [id number] Date of Birth/Sex: Treating RN: 1996-04-04 (26 y.o. Kelly [name], [name] Primary Care Marcques Wrightsman: [name] Other Clinician: Referring Larkin Alfred: Treating Emileigh Kellett/Extender: Elsworth Soho in Treatment: 62 Vital Signs Time Taken: 16:00 Temperature (F): 98.7 Height (in): 62 Pulse (bpm): 109 Respiratory Rate (breaths/min): 17 Blood Pressure (mmHg): 121/89 Reference Range: 80 - 120 mg / dl Electronic Signature(s) Signed: 04/27/2022 1:10:23 PM By: Rhae Hammock RN Entered By: Rhae Hammock on 04/23/2022 16:07:40

## 2022-04-27 NOTE — Progress Notes (Signed)
Kelly [name] ([id number]) Visit Report for 04/23/2022 Chief Complaint Document Details Patient Name: Date of Service: Kelly [name] 04/23/2022 3:15 PM Medical Record Number: [id number] Patient Account Number: [id number] Date of Birth/Sex: Treating RN: 1996/01/31 (26 y.o. Kelly [name], Kelly [name] Primary Care Provider: [name] Other Clinician: Referring Provider: Treating Provider/Extender: Elsworth Soho in [id number] Information Obtained from: Patient Chief Complaint Bilateral lower extremity wounds 04/21/21: left lower extremity wound s/p cellulitis 09/01/21; right lower extremity wound Electronic Signature(s) Signed: 04/23/2022 4:36:36 PM By: Kalman Shan DO Entered By: Kalman Shan on 04/23/2022 16:11:21 -------------------------------------------------------------------------------- HPI Details Patient Name: Date of Service: Kelly Lou [name] 04/23/2022 3:15 PM Medical Record Number: [id number] Patient Account Number: [id number] Date of Birth/Sex: Treating RN: [birth year] (26 y.o. Kelly [name], Kelly [name] Primary Care Provider: [name] Other Clinician: Referring Provider: Treating Provider/Extender: Elsworth Soho in Treatment: 50 History of Present Illness HPI Description: Admission 6/21 Kelly [name] is A 26 year old female with a past medical history of systemic lupus erythematosus that presents with bilateral lower extremity wounds that started in April 2022. She has had skin issues in the past where she developed very small wounds but these healed with time. She has never had open wounds like she does on her legs. She currently reports minimal drainage to the wounds. She does have pain to these areas but has been overall stable. She denies signs of infection. She has visited the ED for this issue and was recently prescribed doxycycline for possible cellulitis. She follows with  [facility] for her SLE and is currently on Plaquenil, prednisone and CellCept. 6/27; patient presents for 1 week follow-up. She states she saw her rheumatologist on 6/22 and prednisone was increased from 10 mg to 20 mg daily. She is currently at the highest dose of Plaquenil and CellCept and is going to try an infusion later today at her regimen. She reports improvement to the wounds. She has been using collagen with dressing changes. She currently denies signs of infection. Readmission 8/26 Patient presents to clinic today for open wounds to her left lower extremity. Her previous wounds that were treated have closed. She states that on 8/9 she developed cellulitis and was treated with 2 rounds of antibiotics. Her cellulitis has resolved. She had blisters that opened and are now large wounds. She has seen her dermatologist and rheumatologist since she has systemic lupus and a hard time healing. No changes have been made to her medications. She was recommended antibiotic ointment by her dermatologist to her wounds She picked up the antibiotic ointment today.. She reports pain to these areas. She denies purulent drainage, increased warmth or erythema to the left lower extremity. She currently keeps the areas covered. Of note she had an Achilles tendon rupture on her right leg and is currently in a soft cast. 9/13; patient presents for follow-up. She missed her last clinic appointment. She has been using Santyl to the wound beds daily. She has no issues or complaints today. She reports the Santyl is helping clean up the wounds. She currently denies signs of infection. 9/22; patient states that 1 week ago she was seen by dermatology and they placed an Unna boot with Bactroban on her left lower extremity. Today she presents with a Unna boot in place. She currently denies signs of infection. 10/7; patient presents for follow-up. She reports improvement. She has been using Santyl to the areas of  nonviable tissue and collagen to granulation tissue. She  has been approved for infusions for her lupus. She is going to start these soon. These will be injection she can do at home. She currently denies signs of infection. 10/21; patient presents for follow-up. She reports continued improvement to her wound healing. She has been using Santyl to all areas except for the superior posterior left leg wound and she is using collagen to this. She has started her injection therapy for lupus. She started 1 week ago. She currently denies signs of infection. 11/3; patient presents for follow-up. She has no issues or complaints today. She is on her third week of injection therapy for lupus and tolerating this well. She has no issues or complaints today. She denies signs of infection. 11/17; patient presents for 2-week follow-up. She reports improvement in wound healing. She denies signs of infection. 12/1; patient presents for follow-up. She has been using collagen to the anterior left lower extremity wounds and Santyl to the left lateral malleolus. She has no issues or complaints today. 12/15; patient presents for follow-up. She is using collagen to the 2 anterior left leg wounds and Santyl to the left lateral malleolus wound. She reports improvement in wound healing. She denies signs of infection. 12/22; patient presents because she is having purulent drainage from her anterior left leg wound. She reports no inciting event and states that the area started draining spontaneously. She was hoping that the drainage would stop however this has not improved. She states this started 5 days ago. She reports tenderness to the wound bed. 12/29; patient presents for follow-up. She was able to take Augmentin for 4 days but did not tolerate the GI side effects. She continues to take doxycycline. She reports improvement in drainage. She no longer has increased warmth to the surrounding wound bed. She ran out of [place] and  has been using Vaseline to the wound beds. 1/6; Patient presents for follow-up. She has been taking Keflex without issues. She reports improvement in drainage and wound healing T the left lower o extremity. Unfortunately she developed another wound spontaneously to the right anterior lower extremity. She denies systemic signs of infection. 1/16; patient presents for follow-up. Patient completed her first prescription of Keflex and has not started the second. She reports no more drainage to the wound beds. She states there was an issue with insurance however she is going to be able to obtain Santyl soon. She has been using Dakin's wet-to-dry dressings to the right anterior leg and left lateral ankle. She has been using collagen to the distal anterior left leg wound and reports that the proximal anterior left leg wound has healed. She denies systemic signs of infection. 1/31; patient presents for follow-up. She reports improvement to the left leg wounds. She reports increased inflammation to the right leg wound. She thinks she is having a reaction to the foam border dressing on the right leg. She is unable to obtain Santyl. She has been using Dakin's wet-to-dry dressings to the right anterior leg and left lateral ankle. She has been using collagen to the distal anterior left leg wound. 2/13; patient presents for follow-up. She reports improvement in her wound healing. One of the wounds to the anterior aspect of the left lower leg has slightly reopened. She denies signs of infection. 2/27; patient presents for follow-up. She has no issues or complaints today. She denies signs of infection. 3/20; patient presents for follow-up. She has been using Medihoney to the right lower extremity wound and left lateral ankle wound. She continues  to use collagen to the left anterior leg wound. She has no issues or complaints today. She denies signs of infection. She reports improvement in wound 4/10; patient has  been using Medihoney to the right anterior lower leg wound and silver collagen on the left including the left ankle and left anterior. Fortunately the left anterior lower leg wound is healed. Both of the areas on the left ankle and the right anterior lower leg appear clean. These wounds are in the setting of chronic lupus. She has had multitude of chronic wounds and a rash on her arms. She tells me she has had both her arms and legs biopsied by dermatology at [facility] and also follows with rheumatology at [facility] although she is not had an appointment this year. She does not have a history of blood clots either arterial or venous. 4/24; patient presents for follow-up. She has been using collagen to the open wound beds. She has developed new areas to her right lower leg. She currently denies signs of infection. 5/9; patient presents for follow-up. She states she is seeing her rheumatologist next week. She has been using Medihoney to all the wound beds except for the lateral left malleolus. She has been using collegen here. She reports improvement to some areas and other areas are flaring due to her lupus. She currently denies signs of infection. 5/23; Patient saw her rheumatologist last week. She has no active lupus activity at this time. Other options for treatment were given. She is going to continue with her current therapy of weekly injections. She reports improvement to Her wound healing on the left ankle. She has been putting Medihoney here. On the right lower extremity she has had a flareup and has 2 open ulcers. She currently denies signs of infection including increased warmth, erythema or purulent drainage. She has been using Medihoney here as well. 7/10; patient presents for follow-up. She was evaluated in the ED on 6/21 and prescribed Doxycycline For worsening pain to her right lower extremity with increased erythema. She subsequently developed a systemic reaction from the antibiotic and had A  diffuse rash. There was concern that her lupus is not well controlled. It was recommended that she start Belimumab. She states she is starting the infusions next week. She has using silver alginate to the wound beds. She currently denies systemic signs of infection. 7/24; patient presents for follow-up. She started her infusion on 7/18 and tolerated this well. She has been using Medihoney to the wound beds. The anterior right leg wound has increased in size. She is currently on Keflex prescribed by her primary care physician. She denies signs of systemic infection. 8/1; patient presents for follow-up. She has been using Medihoney and Dakin's wet-to-dry dressings to the wound beds. She has no issues or complaints today. She denies signs of infection. The posterior right leg wound has healed. 8/14; patient presents for follow-up. She has been using Medihoney to the right lateral leg wound and Dakin's wet-to-dry to the right medial leg wound. She reports improvement in her chronic pain. She is scheduled to have her second infusion this week. She denies signs of infection. 8/28; patient presents for follow-up. She has been using Medihoney to the right lateral leg wound and Dakin's wet-to-dry to the right medial leg wound. 2 weeks ago she developed increased swelling to her foot and drainage to the wound beds. She was started on Keflex and has been taking this for almost 10 days. Despite this her symptoms have not improved.  She has developed increased redness and warmth to the posterior leg over the past several days. She has serous drainage on exam from the wound sites on palpation. She last had her last belimumab infusion on the 18th. Electronic Signature(s) Signed: 04/23/2022 4:36:36 PM By: Kalman Shan DO Entered By: Kalman Shan on 04/23/2022 16:13:50 -------------------------------------------------------------------------------- Physical Exam Details Patient Name: Date of  Service: ELAURA, NACHTMAN. 04/23/2022 3:15 PM Medical Record Number: [id number] Patient Account Number: [id number] Date of Birth/Sex: Treating RN: [birth year] (26 y.o. Kelly [name], Kelly [name] Primary Care Provider: [name] Other Clinician: Referring Provider: Treating Provider/Extender: Elsworth Soho in Treatment: 62 Constitutional respirations regular, non-labored and within target range for patient.. Cardiovascular 2+ dorsalis pedis/posterior tibialis pulses. Psychiatric pleasant and cooperative. Notes Right lower extremity: T the lateral aspect there is a small open wound with nonviable surface. The medial wound has granulation tissue throughout. Serous o drainage to the medial wound on palpation. Increased redness and warmth to the posterior leg. Electronic Signature(s) Signed: 04/23/2022 4:36:36 PM By: Kalman Shan DO Entered By: Kalman Shan on 04/23/2022 16:14:41 -------------------------------------------------------------------------------- Physician Orders Details Patient Name: Date of Service: Kelly Lou RIA Cipriano Bunker. 04/23/2022 3:15 PM Medical Record Number: [id number] Patient Account Number: [id number] Date of Birth/Sex: Treating RN: 09/26/1995 (26 y.o. Kelly [name], Kelly [name] Primary Care Provider: [name] Other Clinician: Referring Provider: Treating Provider/Extender: Elsworth Soho in Treatment: 85 Verbal / Phone Orders: No Diagnosis Coding ICD-10 Coding Code Description [phone] Non-pressure chronic ulcer of other part of left lower leg with fat layer exposed L97.812 Non-pressure chronic ulcer of other part of right lower leg with fat layer exposed M32.9 Systemic lupus erythematosus, unspecified Follow-up Appointments ppointment in 2 weeks. - 05/07/22 @ 3:15 w/ Dr. Heber Fulton and Allayne Butcher Rm # 9 Return A Other: - Go to Emergency Room for possible cellulitis of Right Lower Leg Anesthetic (In  clinic) Topical Lidocaine 5% applied to wound bed (In clinic) Topical Lidocaine 4% applied to wound bed Bathing/ Shower/ Hygiene May shower with protection but do not get wound dressing(s) wet. Edema Control - Lymphedema / SCD / Other Elevate legs to the level of the heart or above for 30 minutes daily and/or when sitting, a frequency of: Avoid standing for long periods of time. Wound Treatment Wound #10 - Lower Leg Wound Laterality: Right, Medial Cleanser: Soap and Water 1 x Per [id number] Days Discharge Instructions: May shower and wash wound with dial antibacterial soap and water prior to dressing change. Cleanser: Wound Cleanser 1 x Per Day/15 Days Discharge Instructions: Cleanse the wound with wound cleanser prior to applying a clean dressing using gauze sponges, not tissue or cotton balls. Prim Dressing: Dakin's Solution 0.25%, 16 (oz) 1 x Per Day/15 Days ary Discharge Instructions: Moisten gauze with Dakin's solution Secondary Dressing: Bordered Gauze, 4x4 in 1 x Per Day/15 Days Discharge Instructions: Apply over primary dressing as directed. Secured With: [employer], 4.5x3.1 (in/yd) 1 x Per Day/15 Days Discharge Instructions: Secure with Kerlix as directed. Secured With: [employer] Size 5, 10 (yds) 1 x Per Day/15 Days Wound #12 - Lower Leg Wound Laterality: Right, Anterior Cleanser: Soap and Water 1 x Per Day/15 Days Discharge Instructions: May shower and wash wound with dial antibacterial soap and water prior to dressing change. Cleanser: Wound Cleanser 1 x Per Day/15 Days Discharge Instructions: Cleanse the wound with wound cleanser prior to applying a clean dressing using gauze sponges, not tissue or cotton balls. Prim Dressing: MediHoney Gel, tube  1.5 (oz) 1 x Per Day/15 Days ary Discharge Instructions: Apply to wound bed as instructed Secondary Dressing: Woven Gauze Sponge, Non-Sterile 4x4 in 1 x Per Day/15 Days Discharge Instructions: Apply over primary dressing as  directed. Secured With: [employer], 4.5x3.1 (in/yd) 1 x Per Day/15 Days Discharge Instructions: Secure with Kerlix as directed. Secured With: [employer] Size 5, 10 (yds) 1 x Per [id number] Days Electronic Signature(s) Signed: 04/23/2022 4:36:36 PM By: Kalman Shan DO Signed: 04/27/2022 1:10:23 PM By: Rhae Hammock RN Previous Signature: 04/23/2022 4:15:57 PM Version By: Kalman Shan DO Entered By: Rhae Hammock on 04/23/2022 16:21:06 -------------------------------------------------------------------------------- Problem List Details Patient Name: Date of Service: Kelly Lou RIA Cipriano Bunker. 04/23/2022 3:15 PM Medical Record Number: [id number] Patient Account Number: [id number] Date of Birth/Sex: Treating RN: August 31, 1995 (26 y.o. Kelly [name], Kelly [name] Primary Care Provider: [name] Other Clinician: Referring Provider: Treating Provider/Extender: Elsworth Soho in Treatment: 62 Active Problems ICD-10 Encounter Code Description Active Date MDM Diagnosis [id number] Non-pressure chronic ulcer of other part of left lower leg with fat layer exposed11/10/2020 No Yes L97.812 Non-pressure chronic ulcer of other part of right lower leg with fat layer 06/29/2021 No Yes exposed M32.9 Systemic lupus erythematosus, unspecified 02/14/2021 No Yes Inactive Problems ICD-10 Code Description Active Date Inactive Date L97.819 Non-pressure chronic ulcer of other part of right lower leg with unspecified severity 02/13/2021 02/13/2021 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 with unspecified severity 02/13/2021 02/13/2021 [id number] Unspecified open wound, left lower leg, initial encounter 04/21/2021 04/21/2021 S91.002A Unspecified open wound, left ankle, initial encounter 04/21/2021 04/21/2021 Resolved Problems ICD-10 Code Description Active Date Resolved Date L03.116 Cellulitis of left lower limb 08/24/2021 08/24/2021 Electronic  Signature(s) Signed: 04/23/2022 4:36:36 PM By: Kalman Shan DO Entered By: Kalman Shan on 04/23/2022 16:11:03 -------------------------------------------------------------------------------- Progress Note Details Patient Name: Date of Service: Kelly Lou RIA Cipriano Bunker. 04/23/2022 3:15 PM Medical Record Number: [id number] Patient Account Number: [id number] Date of Birth/Sex: Treating RN: 05/21/1996 (26 y.o. Kelly [name], Kelly [name] Primary Care Provider: [name] Other Clinician: Referring Provider: Treating Provider/Extender: Elsworth Soho in Treatment: 62 Subjective Chief Complaint Information obtained from Patient Bilateral lower extremity wounds 04/21/21: left lower extremity wound s/p cellulitis 09/01/21; right lower extremity wound History of Present Illness (HPI) Admission 6/21 Ms. Audine Lipton is A 26 year old female with a past medical history of systemic lupus erythematosus that presents with bilateral lower extremity wounds that started in April 2022. She has had skin issues in the past where she developed very small wounds but these healed with time. She has never had open wounds like she does on her legs. She currently reports minimal drainage to the wounds. She does have pain to these areas but has been overall stable. She denies signs of infection. She has visited the ED for this issue and was recently prescribed doxycycline for possible cellulitis. She follows with [facility] for her SLE and is currently on Plaquenil, prednisone and CellCept. 6/27; patient presents for 1 week follow-up. She states she saw her rheumatologist on 6/22 and prednisone was increased from 10 mg to 20 mg daily. She is currently at the highest dose of Plaquenil and CellCept and is going to try an infusion later today at her regimen. She reports improvement to the wounds. She has been using collagen with dressing changes. She currently denies signs of  infection. Readmission 8/26 Patient presents to clinic today for open wounds to her left lower extremity. Her previous wounds that  were treated have closed. She states that on 8/9 she developed cellulitis and was treated with 2 rounds of antibiotics. Her cellulitis has resolved. She had blisters that opened and are now large wounds. She has seen her dermatologist and rheumatologist since she has systemic lupus and a hard time healing. No changes have been made to her medications. She was recommended antibiotic ointment by her dermatologist to her wounds She picked up the antibiotic ointment today.. She reports pain to these areas. She denies purulent drainage, increased warmth or erythema to the left lower extremity. She currently keeps the areas covered. Of note she had an Achilles tendon rupture on her right leg and is currently in a soft cast. 9/13; patient presents for follow-up. She missed her last clinic appointment. She has been using Santyl to the wound beds daily. She has no issues or complaints today. She reports the Santyl is helping clean up the wounds. She currently denies signs of infection. 9/22; patient states that 1 week ago she was seen by dermatology and they placed an Unna boot with Bactroban on her left lower extremity. Today she presents with a Unna boot in place. She currently denies signs of infection. 10/7; patient presents for follow-up. She reports improvement. She has been using Santyl to the areas of nonviable tissue and collagen to granulation tissue. She has been approved for infusions for her lupus. She is going to start these soon. These will be injection she can do at home. She currently denies signs of infection. 10/21; patient presents for follow-up. She reports continued improvement to her wound healing. She has been using Santyl to all areas except for the superior posterior left leg wound and she is using collagen to this. She has started her injection therapy  for lupus. She started 1 week ago. She currently denies signs of infection. 11/3; patient presents for follow-up. She has no issues or complaints today. She is on her third week of injection therapy for lupus and tolerating this well. She has no issues or complaints today. She denies signs of infection. 11/17; patient presents for 2-week follow-up. She reports improvement in wound healing. She denies signs of infection. 12/1; patient presents for follow-up. She has been using collagen to the anterior left lower extremity wounds and Santyl to the left lateral malleolus. She has no issues or complaints today. 12/15; patient presents for follow-up. She is using collagen to the 2 anterior left leg wounds and Santyl to the left lateral malleolus wound. She reports improvement in wound healing. She denies signs of infection. 12/22; patient presents because she is having purulent drainage from her anterior left leg wound. She reports no inciting event and states that the area started draining spontaneously. She was hoping that the drainage would stop however this has not improved. She states this started 5 days ago. She reports tenderness to the wound bed. 12/29; patient presents for follow-up. She was able to take Augmentin for 4 days but did not tolerate the GI side effects. She continues to take doxycycline. She reports improvement in drainage. She no longer has increased warmth to the surrounding wound bed. She ran out of [name] and has been using Vaseline to the wound beds. 1/6; Patient presents for follow-up. She has been taking Keflex without issues. She reports improvement in drainage and wound healing T the left lower o extremity. Unfortunately she developed another wound spontaneously to the right anterior lower extremity. She denies systemic signs of infection. 1/16; patient presents for follow-up. Patient  completed her first prescription of Keflex and has not started the second. She reports no  more drainage to the wound beds. She states there was an issue with insurance however she is going to be able to obtain Santyl soon. She has been using Dakin's wet-to-dry dressings to the right anterior leg and left lateral ankle. She has been using collagen to the distal anterior left leg wound and reports that the proximal anterior left leg wound has healed. She denies systemic signs of infection. 1/31; patient presents for follow-up. She reports improvement to the left leg wounds. She reports increased inflammation to the right leg wound. She thinks she is having a reaction to the foam border dressing on the right leg. She is unable to obtain Santyl. She has been using Dakin's wet-to-dry dressings to the right anterior leg and left lateral ankle. She has been using collagen to the distal anterior left leg wound. 2/13; patient presents for follow-up. She reports improvement in her wound healing. One of the wounds to the anterior aspect of the left lower leg has slightly reopened. She denies signs of infection. 2/27; patient presents for follow-up. She has no issues or complaints today. She denies signs of infection. 3/20; patient presents for follow-up. She has been using Medihoney to the right lower extremity wound and left lateral ankle wound. She continues to use collagen to the left anterior leg wound. She has no issues or complaints today. She denies signs of infection. She reports improvement in wound 4/10; patient has been using Medihoney to the right anterior lower leg wound and silver collagen on the left including the left ankle and left anterior. Fortunately the left anterior lower leg wound is healed. Both of the areas on the left ankle and the right anterior lower leg appear clean. These wounds are in the setting of chronic lupus. She has had multitude of chronic wounds and a rash on her arms. She tells me she has had both her arms and legs biopsied by dermatology at [facility] and also  follows with rheumatology at [facility] although she is not had an appointment this year. She does not have a history of blood clots either arterial or venous. 4/24; patient presents for follow-up. She has been using collagen to the open wound beds. She has developed new areas to her right lower leg. She currently denies signs of infection. 5/9; patient presents for follow-up. She states she is seeing her rheumatologist next week. She has been using Medihoney to all the wound beds except for the lateral left malleolus. She has been using collegen here. She reports improvement to some areas and other areas are flaring due to her lupus. She currently denies signs of infection. 5/23; Patient saw her rheumatologist last week. She has no active lupus activity at this time. Other options for treatment were given. She is going to continue with her current therapy of weekly injections. She reports improvement to Her wound healing on the left ankle. She has been putting Medihoney here. On the right lower extremity she has had a flareup and has 2 open ulcers. She currently denies signs of infection including increased warmth, erythema or purulent drainage. She has been using Medihoney here as well. 7/10; patient presents for follow-up. She was evaluated in the ED on 6/21 and prescribed Doxycycline For worsening pain to her right lower extremity with increased erythema. She subsequently developed a systemic reaction from the antibiotic and had A diffuse rash. There was concern that her lupus is not  well controlled. It was recommended that she start Belimumab. She states she is starting the infusions next week. She has using silver alginate to the wound beds. She currently denies systemic signs of infection. 7/24; patient presents for follow-up. She started her infusion on 7/18 and tolerated this well. She has been using Medihoney to the wound beds. The anterior right leg wound has increased in size. She is currently  on Keflex prescribed by her primary care physician. She denies signs of systemic infection. 8/1; patient presents for follow-up. She has been using Medihoney and Dakin's wet-to-dry dressings to the wound beds. She has no issues or complaints today. She denies signs of infection. The posterior right leg wound has healed. 8/14; patient presents for follow-up. She has been using Medihoney to the right lateral leg wound and Dakin's wet-to-dry to the right medial leg wound. She reports improvement in her chronic pain. She is scheduled to have her second infusion this week. She denies signs of infection. 8/28; patient presents for follow-up. She has been using Medihoney to the right lateral leg wound and Dakin's wet-to-dry to the right medial leg wound. 2 weeks ago she developed increased swelling to her foot and drainage to the wound beds. She was started on Keflex and has been taking this for almost 10 days. Despite this her symptoms have not improved. She has developed increased redness and warmth to the posterior leg over the past several days. She has serous drainage on exam from the wound sites on palpation. She last had her last belimumab infusion on the 18th. Patient History Information obtained from Patient. Family History Diabetes - Paternal Grandparents, Hypertension - Paternal Grandparents, Thyroid Problems - Maternal Grandparents,Paternal Grandparents, No family history of Cancer, Heart Disease, Hereditary Spherocytosis, Kidney Disease, Lung Disease, Seizures, Stroke, Tuberculosis. Social History Never smoker, Marital Status - Married, Alcohol Use - Moderate, Drug Use - Current History - Marijuana, Caffeine Use - Daily - Coffee. Medical History Respiratory Patient has history of Asthma Immunological Patient has history of Lupus Erythematosus Objective Constitutional respirations regular, non-labored and within target range for patient.. Vitals Time Taken: 4:00 PM, Height: 62 in,  Temperature: 98.7 F, Pulse: 109 bpm, Respiratory Rate: 17 breaths/min, Blood Pressure: 121/89 mmHg. Cardiovascular 2+ dorsalis pedis/posterior tibialis pulses. Psychiatric pleasant and cooperative. General Notes: Right lower extremity: T the lateral aspect there is a small open wound with nonviable surface. The medial wound has granulation tissue o throughout. Serous drainage to the medial wound on palpation. Increased redness and warmth to the posterior leg. Integumentary (Hair, Skin) Wound #10 status is Open. Original cause of wound was Gradually Appeared. The date acquired was: 12/18/2021. The wound has been in treatment 18 weeks. The wound is located on the Right,Medial Lower Leg. The wound measures 3.4cm length x 3cm width x 0.7cm depth; 8.011cm^2 area and 5.608cm^3 volume. There is Fat Layer (Subcutaneous Tissue) exposed. There is a medium amount of serosanguineous drainage noted. The wound margin is well defined and not attached to the wound base. There is large (67-100%) red, pink granulation within the wound bed. There is a small (1-33%) amount of necrotic tissue within the wound bed including Adherent Slough. Wound #12 status is Open. Original cause of wound was Gradually Appeared. The date acquired was: 03/19/2022. The wound has been in treatment 5 weeks. The wound is located on the Right,Anterior Lower Leg. The wound measures 0.6cm length x 0.7cm width x 0.3cm depth; 0.33cm^2 area and 0.099cm^3 volume. There is Fat Layer (Subcutaneous Tissue) exposed. There  is no tunneling or undermining noted. There is a medium amount of serosanguineous drainage noted. The wound margin is epibole. There is small (1-33%) red granulation within the wound bed. There is a large (67-100%) amount of necrotic tissue within the wound bed including Adherent Slough. Assessment Active Problems ICD-10 Non-pressure chronic ulcer of other part of left lower leg with fat layer exposed Non-pressure chronic ulcer  of other part of right lower leg with fat layer exposed Systemic lupus erythematosus, unspecified Patient presents today with increased drainage to the medial right leg wound and increased erythema and warmth to the posterior aspect. She has been taking Keflex for the past 10 days for this issue. Despite this her symptoms have not improved but worsened. At this time I recommended going to the ED for further evaluation. She could potentially have an abscess versus DVT vs cellulitis requiring IV antibiotics. I obtained a wound culture from the drainage. She can continue Dakin's wet-to-dry dressings and Medihoney Plan The following medication(s) was prescribed: Dakin's Solution miscellaneous 0.25 % solution 1 Moisten gauze for wet to dry dressings starting 04/23/2022 1. Dakin's wet-to-dry dressings -right medial wound 2. Medihoney -right lateral wound 3. Referred to the ED for further evaluation for potential IV antibiotics and imaging Electronic Signature(s) Signed: 04/23/2022 4:36:36 PM By: Geralyn Corwin DO Entered By: Geralyn Corwin on 04/23/2022 16:30:03 -------------------------------------------------------------------------------- HxROS Details Patient Name: Date of Service: Kelly Milling [name] 04/23/2022 3:15 PM Medical Record Number: [id number] Patient Account Number: [id number] Date of Birth/Sex: Treating RN: 03/12/1996 (26 y.o. [name], Kelly [name] Primary Care Provider: Dione Booze Other Clinician: Referring Provider: Treating Provider/Extender: Gennaro Africa in Treatment: 62 Information Obtained From Patient Respiratory Medical History: Positive for: Asthma Immunological Medical History: Positive for: Lupus Erythematosus Immunizations Pneumococcal Vaccine: Received Pneumococcal Vaccination: No Implantable Devices None Family and Social History Cancer: No; Diabetes: Yes - Paternal Grandparents; Heart Disease: No; Hereditary  Spherocytosis: No; Hypertension: Yes - Paternal Grandparents; Kidney Disease: No; Lung Disease: No; Seizures: No; Stroke: No; Thyroid Problems: Yes - Maternal Grandparents,Paternal Grandparents; Tuberculosis: No; Never smoker; Marital Status - Married; Alcohol Use: Moderate; Drug Use: Current History - Marijuana; Caffeine Use: Daily - Coffee; Financial Concerns: No; Food, Clothing or Shelter Needs: No; Support System Lacking: No Electronic Signature(s) Signed: 04/23/2022 4:36:36 PM By: Geralyn Corwin DO Signed: 04/27/2022 1:10:23 PM By: Fonnie Mu RN Entered By: Geralyn Corwin on 04/23/2022 16:13:56 -------------------------------------------------------------------------------- SuperBill Details Patient Name: Date of Service: Kelly [name]. 04/23/2022 Medical Record Number: [id number] Patient Account Number: [id number] Date of Birth/Sex: Treating RN: [birth year] (26 y.o. [name], Kelly [name] Primary Care Provider: Dione Booze Other Clinician: Referring Provider: Treating Provider/Extender: Gennaro Africa in Treatment: 62 Diagnosis Coding ICD-10 Codes Code Description [phone] Non-pressure chronic ulcer of other part of left lower leg with fat layer exposed L97.812 Non-pressure chronic ulcer of other part of right lower leg with fat layer exposed M32.9 Systemic lupus erythematosus, unspecified Facility Procedures CPT4 Code: [id number] Description: 99214 - WOUND CARE VISIT-LEV 4 EST PT Modifier: Quantity: 1 Physician Procedures : CPT4 Code Description Modifier [id number] 99214 - WC PHYS LEVEL 4 - EST PT ICD-10 Diagnosis Description L97.822 Non-pressure chronic ulcer of other part of left lower leg with fat layer exposed L97.812 Non-pressure chronic ulcer of other part of right  lower leg with fat layer exposed M32.9 Systemic lupus erythematosus, unspecified Quantity: 1 Electronic Signature(s) Signed: 04/23/2022 4:36:36 PM By: Geralyn Corwin  DO Entered By: Geralyn Corwin on 04/23/2022 16:30:22

## 2022-04-28 LAB — CULTURE, BLOOD (ROUTINE X 2)
Culture: NO GROWTH
Culture: NO GROWTH
Special Requests: ADEQUATE
Special Requests: ADEQUATE

## 2022-05-07 ENCOUNTER — Encounter (HOSPITAL_BASED_OUTPATIENT_CLINIC_OR_DEPARTMENT_OTHER): Payer: Managed Care, Other (non HMO) | Attending: Internal Medicine | Admitting: Internal Medicine

## 2022-05-07 DIAGNOSIS — M329 Systemic lupus erythematosus, unspecified: Secondary | ICD-10-CM | POA: Diagnosis not present

## 2022-05-07 DIAGNOSIS — L97822 Non-pressure chronic ulcer of other part of left lower leg with fat layer exposed: Secondary | ICD-10-CM | POA: Insufficient documentation

## 2022-05-07 DIAGNOSIS — J45909 Unspecified asthma, uncomplicated: Secondary | ICD-10-CM | POA: Insufficient documentation

## 2022-05-07 DIAGNOSIS — L97812 Non-pressure chronic ulcer of other part of right lower leg with fat layer exposed: Secondary | ICD-10-CM | POA: Insufficient documentation

## 2022-05-07 DIAGNOSIS — Z79624 Long term (current) use of inhibitors of nucleotide synthesis: Secondary | ICD-10-CM | POA: Diagnosis not present

## 2022-05-07 DIAGNOSIS — G8929 Other chronic pain: Secondary | ICD-10-CM | POA: Diagnosis not present

## 2022-05-07 NOTE — Progress Notes (Signed)
Kelly, [name] ([id number]) Visit Report for 05/07/2022 Chief Complaint Document Details Patient Name: Date of Service: Kelly [name], Kelly [name]. 05/07/2022 3:15 PM Medical Record Number: [id number] Patient Account Number: [id number] Date of Birth/Sex: Treating RN: 1996-05-26 (26 y.o. F) Primary Care Provider: Dione Booze Other Clinician: Referring Provider: Treating Provider/Extender: Gennaro Africa in Treatment: 78 Information Obtained from: Patient Chief Complaint Bilateral lower extremity wounds 04/21/21: left lower extremity wound s/p cellulitis 09/01/21; right lower extremity wound Electronic Signature(s) Signed: 05/07/2022 4:17:37 PM By: Geralyn Corwin DO Entered By: Geralyn Corwin on 05/07/2022 16:12:56 -------------------------------------------------------------------------------- HPI Details Patient Name: Date of Service: Kelly [name] [name] 05/07/2022 3:15 PM Medical Record Number: [id number] Patient Account Number: [id number] Date of Birth/Sex: Treating RN: 1996-05-13 (27 y.o. F) Primary Care Provider: Dione Booze Other Clinician: Referring Provider: Treating Provider/Extender: Gennaro Africa in Treatment: 64 History of Present Illness HPI Description: Admission 6/21 Kelly [name] is A 26 year old female with a past medical history of systemic lupus erythematosus that presents with bilateral lower extremity wounds that started in April 2022. She has had skin issues in the past where she developed very small wounds but these healed with time. She has never had open wounds like she does on her legs. She currently reports minimal drainage to the wounds. She does have pain to these areas but has been overall stable. She denies signs of infection. She has visited the ED for this issue and was recently prescribed doxycycline for possible cellulitis. She follows with [facility] for her SLE and is  currently on Plaquenil, prednisone and CellCept. 6/27; patient presents for 1 week follow-up. She states she saw her rheumatologist on 6/22 and prednisone was increased from 10 mg to 20 mg daily. She is currently at the highest dose of Plaquenil and CellCept and is going to try an infusion later today at her regimen. She reports improvement to the wounds. She has been using collagen with dressing changes. She currently denies signs of infection. Readmission 8/26 Patient presents to clinic today for open wounds to her left lower extremity. Her previous wounds that were treated have closed. She states that on 8/9 she developed cellulitis and was treated with 2 rounds of antibiotics. Her cellulitis has resolved. She had blisters that opened and are now large wounds. She has seen her dermatologist and rheumatologist since she has systemic lupus and a hard time healing. No changes have been made to her medications. She was recommended antibiotic ointment by her dermatologist to her wounds She picked up the antibiotic ointment today.. She reports pain to these areas. She denies purulent drainage, increased warmth or erythema to the left lower extremity. She currently keeps the areas covered. Of note she had an Achilles tendon rupture on her right leg and is currently in a soft cast. 9/13; patient presents for follow-up. She missed her last clinic appointment. She has been using Santyl to the wound beds daily. She has no issues or complaints today. She reports the Santyl is helping clean up the wounds. She currently denies signs of infection. 9/22; patient states that 1 week ago she was seen by dermatology and they placed an Unna boot with Bactroban on her left lower extremity. Today she presents with a Unna boot in place. She currently denies signs of infection. 10/7; patient presents for follow-up. She reports improvement. She has been using Santyl to the areas of nonviable tissue and collagen to  granulation tissue. She has been approved for  infusions for her lupus. She is going to start these soon. These will be injection she can do at home. She currently denies signs of infection. 10/21; patient presents for follow-up. She reports continued improvement to her wound healing. She has been using Santyl to all areas except for the superior posterior left leg wound and she is using collagen to this. She has started her injection therapy for lupus. She started 1 week ago. She currently denies signs of infection. 11/3; patient presents for follow-up. She has no issues or complaints today. She is on her third week of injection therapy for lupus and tolerating this well. She has no issues or complaints today. She denies signs of infection. 11/17; patient presents for 2-week follow-up. She reports improvement in wound healing. She denies signs of infection. 12/1; patient presents for follow-up. She has been using collagen to the anterior left lower extremity wounds and Santyl to the left lateral malleolus. She has no issues or complaints today. 12/15; patient presents for follow-up. She is using collagen to the 2 anterior left leg wounds and Santyl to the left lateral malleolus wound. She reports improvement in wound healing. She denies signs of infection. 12/22; patient presents because she is having purulent drainage from her anterior left leg wound. She reports no inciting event and states that the area started draining spontaneously. She was hoping that the drainage would stop however this has not improved. She states this started 5 days ago. She reports tenderness to the wound bed. 12/29; patient presents for follow-up. She was able to take Augmentin for 4 days but did not tolerate the GI side effects. She continues to take doxycycline. She reports improvement in drainage. She no longer has increased warmth to the surrounding wound bed. She ran out of [place] and has been using Vaseline to the  wound beds. 1/6; Patient presents for follow-up. She has been taking Keflex without issues. She reports improvement in drainage and wound healing T the left lower o extremity. Unfortunately she developed another wound spontaneously to the right anterior lower extremity. She denies systemic signs of infection. 1/16; patient presents for follow-up. Patient completed her first prescription of Keflex and has not started the second. She reports no more drainage to the wound beds. She states there was an issue with insurance however she is going to be able to obtain Santyl soon. She has been using Dakin's wet-to-dry dressings to the right anterior leg and left lateral ankle. She has been using collagen to the distal anterior left leg wound and reports that the proximal anterior left leg wound has healed. She denies systemic signs of infection. 1/31; patient presents for follow-up. She reports improvement to the left leg wounds. She reports increased inflammation to the right leg wound. She thinks she is having a reaction to the foam border dressing on the right leg. She is unable to obtain Santyl. She has been using Dakin's wet-to-dry dressings to the right anterior leg and left lateral ankle. She has been using collagen to the distal anterior left leg wound. 2/13; patient presents for follow-up. She reports improvement in her wound healing. One of the wounds to the anterior aspect of the left lower leg has slightly reopened. She denies signs of infection. 2/27; patient presents for follow-up. She has no issues or complaints today. She denies signs of infection. 3/20; patient presents for follow-up. She has been using Medihoney to the right lower extremity wound and left lateral ankle wound. She continues to use collagen to  the left anterior leg wound. She has no issues or complaints today. She denies signs of infection. She reports improvement in wound 4/10; patient has been using Medihoney to the right  anterior lower leg wound and silver collagen on the left including the left ankle and left anterior. Fortunately the left anterior lower leg wound is healed. Both of the areas on the left ankle and the right anterior lower leg appear clean. These wounds are in the setting of chronic lupus. She has had multitude of chronic wounds and a rash on her arms. She tells me she has had both her arms and legs biopsied by dermatology at [facility] and also follows with rheumatology at [facility] although she is not had an appointment this year. She does not have a history of blood clots either arterial or venous. 4/24; patient presents for follow-up. She has been using collagen to the open wound beds. She has developed new areas to her right lower leg. She currently denies signs of infection. 5/9; patient presents for follow-up. She states she is seeing her rheumatologist next week. She has been using Medihoney to all the wound beds except for the lateral left malleolus. She has been using collegen here. She reports improvement to some areas and other areas are flaring due to her lupus. She currently denies signs of infection. 5/23; Patient saw her rheumatologist last week. She has no active lupus activity at this time. Other options for treatment were given. She is going to continue with her current therapy of weekly injections. She reports improvement to Her wound healing on the left ankle. She has been putting Medihoney here. On the right lower extremity she has had a flareup and has 2 open ulcers. She currently denies signs of infection including increased warmth, erythema or purulent drainage. She has been using Medihoney here as well. 7/10; patient presents for follow-up. She was evaluated in the ED on 6/21 and prescribed Doxycycline For worsening pain to her right lower extremity with increased erythema. She subsequently developed a systemic reaction from the antibiotic and had A diffuse rash. There was concern that  her lupus is not well controlled. It was recommended that she start Belimumab. She states she is starting the infusions next week. She has using silver alginate to the wound beds. She currently denies systemic signs of infection. 7/24; patient presents for follow-up. She started her infusion on 7/18 and tolerated this well. She has been using Medihoney to the wound beds. The anterior right leg wound has increased in size. She is currently on Keflex prescribed by her primary care physician. She denies signs of systemic infection. 8/1; patient presents for follow-up. She has been using Medihoney and Dakin's wet-to-dry dressings to the wound beds. She has no issues or complaints today. She denies signs of infection. The posterior right leg wound has healed. 8/14; patient presents for follow-up. She has been using Medihoney to the right lateral leg wound and Dakin's wet-to-dry to the right medial leg wound. She reports improvement in her chronic pain. She is scheduled to have her second infusion this week. She denies signs of infection. 8/28; patient presents for follow-up. She has been using Medihoney to the right lateral leg wound and Dakin's wet-to-dry to the right medial leg wound. 2 weeks ago she developed increased swelling to her foot and drainage to the wound beds. She was started on Keflex and has been taking this for almost 10 days. Despite this her symptoms have not improved. She has developed increased  redness and warmth to the posterior leg over the past several days. She has serous drainage on exam from the wound sites on palpation. She last had her last belimumab infusion on the 18th. 9/11; patient presents for follow-up. She went to the ED after our last clinic visit. She was admitted and given IV antibiotics for concern for of abscess and cellulitis. She was discharged with Augmentin. She reports improvement in her symptoms. She has been using Medihoney and Dakin's wet-to-dry  dressings. Electronic Signature(s) Signed: 05/07/2022 4:17:37 PM By: Geralyn Corwin DO Entered By: Geralyn Corwin on 05/07/2022 16:14:54 -------------------------------------------------------------------------------- Physical Exam Details Patient Name: Date of Service: Kelly [name] RIA Cherly Anderson. 05/07/2022 3:15 PM Medical Record Number: [id number] Patient Account Number: [id number] Date of Birth/Sex: Treating RN: 08-Mar-1996 (26 y.o. F) Primary Care Provider: Dione Booze Other Clinician: Referring Provider: Treating Provider/Extender: Gennaro Africa in Treatment: 64 Constitutional respirations regular, non-labored and within target range for patient.. Cardiovascular 2+ dorsalis pedis/posterior tibialis pulses. Psychiatric pleasant and cooperative. Notes Right lower extremity: T the lateral aspect there is an open wound with granulation tissue. Slight tunneling at the 11 o'clock position. The medial wound has o granulation tissue throughout and no tunneling or undermining. No increased warmth or erythema to the leg. No purulent drainage. Electronic Signature(s) Signed: 05/07/2022 4:17:37 PM By: Geralyn Corwin DO Entered By: Geralyn Corwin on 05/07/2022 16:15:42 -------------------------------------------------------------------------------- Physician Orders Details Patient Name: Date of Service: Kelly [name] [name] 05/07/2022 3:15 PM Medical Record Number: [id number] Patient Account Number: [id number] Date of Birth/Sex: Treating RN: [birth year] (26 y.o. Debara Pickett, Millard.Loa Primary Care Provider: Dione Booze Other Clinician: Referring Provider: Treating Provider/Extender: Gennaro Africa in Treatment: 73 Verbal / Phone Orders: No Diagnosis Coding ICD-10 Coding Code Description [id number] Non-pressure chronic ulcer of other part of left lower leg with fat layer exposed L97.812 Non-pressure chronic ulcer of other part of  right lower leg with fat layer exposed M32.9 Systemic lupus erythematosus, unspecified Follow-up Appointments ppointment in 2 weeks. - Dr. Mikey Bussing and Maryruth Bun Rm # 9 Return A Anesthetic (In clinic) Topical Lidocaine 5% applied to wound bed Bathing/ Shower/ Hygiene May shower with protection but do not get wound dressing(s) wet. Edema Control - Lymphedema / SCD / Other Elevate legs to the level of the heart or above for 30 minutes daily and/or when sitting, a frequency of: Avoid standing for long periods of time. Wound Treatment Wound #10 - Lower Leg Wound Laterality: Right, Medial Cleanser: Soap and Water 1 x Per Day/15 Days Discharge Instructions: May shower and wash wound with dial antibacterial soap and water prior to dressing change. Cleanser: Wound Cleanser 1 x Per Day/15 Days Discharge Instructions: Cleanse the wound with wound cleanser prior to applying a clean dressing using gauze sponges, not tissue or cotton balls. Prim Dressing: MediHoney Gel, tube 1.5 (oz) 1 x Per Day/15 Days ary Discharge Instructions: Apply to wound bed as instructed Secondary Dressing: Bordered Gauze, 4x4 in 1 x Per Day/15 Days Discharge Instructions: Apply over primary dressing as directed. Secured With: [employer], 4.5x3.1 (in/yd) 1 x Per Day/15 Days Discharge Instructions: Secure with Kerlix as directed. Secured With: [employer] Size 5, 10 (yds) 1 x Per Day/15 Days Wound #12 - Lower Leg Wound Laterality: Right, Anterior Cleanser: Soap and Water 1 x Per Day/15 Days Discharge Instructions: May shower and wash wound with dial antibacterial soap and water prior to dressing change. Cleanser: Wound Cleanser 1 x Per Day/15 Days Discharge Instructions: Cleanse  the wound with wound cleanser prior to applying a clean dressing using gauze sponges, not tissue or cotton balls. Prim Dressing: MediHoney Gel, tube 1.5 (oz) 1 x Per Day/15 Days ary Discharge Instructions: Apply to wound bed as  instructed Secondary Dressing: Woven Gauze Sponge, Non-Sterile 4x4 in 1 x Per Day/15 Days Discharge Instructions: Apply over primary dressing as directed. Secured With: [employer], 4.5x3.1 (in/yd) 1 x Per Day/15 Days Discharge Instructions: Secure with Kerlix as directed. Secured With: [employer] Size 5, 10 (yds) 1 x Per Day/15 Days Patient Medications llergies: doxycycline A Notifications Medication Indication Start End 05/07/2022 lidocaine DOSE topical 5 % gel - gel topical applied only in clinic for debridements. Electronic Signature(s) Signed: 05/07/2022 4:17:37 PM By: Geralyn Corwin DO Entered By: Geralyn Corwin on 05/07/2022 16:15:49 -------------------------------------------------------------------------------- Problem List Details Patient Name: Date of Service: Kelly [name] [name] 05/07/2022 3:15 PM Medical Record Number: [id number] Patient Account Number: [id number] Date of Birth/Sex: Treating RN: 1995-09-06 (26 y.o. Debara Pickett, Millard.Loa Primary Care Provider: Dione Booze Other Clinician: Referring Provider: Treating Provider/Extender: Gennaro Africa in Treatment: 64 Active Problems ICD-10 Encounter Code Description Active Date MDM Diagnosis L97.822 Non-pressure chronic ulcer of other part of left lower leg with fat layer exposed11/10/2020 No Yes L97.812 Non-pressure chronic ulcer of other part of right lower leg with fat layer 06/29/2021 No Yes exposed M32.9 Systemic lupus erythematosus, unspecified 02/14/2021 No Yes Inactive Problems ICD-10 Code Description Active Date Inactive Date L97.819 Non-pressure chronic ulcer of other part of right lower leg with unspecified severity 02/13/2021 02/13/2021 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 with unspecified severity 02/13/2021 02/13/2021 Q00.867Y Unspecified open wound, left lower leg, initial encounter 04/21/2021 04/21/2021 S91.002A Unspecified open wound,  left ankle, initial encounter 04/21/2021 04/21/2021 Resolved Problems ICD-10 Code Description Active Date Resolved Date L03.116 Cellulitis of left lower limb 08/24/2021 08/24/2021 Electronic Signature(s) Signed: 05/07/2022 4:17:37 PM By: Geralyn Corwin DO Entered By: Geralyn Corwin on 05/07/2022 16:12:26 -------------------------------------------------------------------------------- Progress Note Details Patient Name: Date of Service: Kelly [name] [name] 05/07/2022 3:15 PM Medical Record Number: [id number] Patient Account Number: [id number] Date of Birth/Sex: Treating RN: 04/08/1996 (26 y.o. F) Primary Care Provider: Dione Booze Other Clinician: Referring Provider: Treating Provider/Extender: Gennaro Africa in Treatment: 64 Subjective Chief Complaint Information obtained from Patient Bilateral lower extremity wounds 04/21/21: left lower extremity wound s/p cellulitis 09/01/21; right lower extremity wound History of Present Illness (HPI) Admission 6/21 Ms. Camdynn Maranto is A 26 year old female with a past medical history of systemic lupus erythematosus that presents with bilateral lower extremity wounds that started in April 2022. She has had skin issues in the past where she developed very small wounds but these healed with time. She has never had open wounds like she does on her legs. She currently reports minimal drainage to the wounds. She does have pain to these areas but has been overall stable. She denies signs of infection. She has visited the ED for this issue and was recently prescribed doxycycline for possible cellulitis. She follows with [facility] for her SLE and is currently on Plaquenil, prednisone and CellCept. 6/27; patient presents for 1 week follow-up. She states she saw her rheumatologist on 6/22 and prednisone was increased from 10 mg to 20 mg daily. She is currently at the highest dose of Plaquenil and CellCept and is  going to try an infusion later today at her regimen. She reports improvement to the wounds. She has been  using collagen with dressing changes. She currently denies signs of infection. Readmission 8/26 Patient presents to clinic today for open wounds to her left lower extremity. Her previous wounds that were treated have closed. She states that on 8/9 she developed cellulitis and was treated with 2 rounds of antibiotics. Her cellulitis has resolved. She had blisters that opened and are now large wounds. She has seen her dermatologist and rheumatologist since she has systemic lupus and a hard time healing. No changes have been made to her medications. She was recommended antibiotic ointment by her dermatologist to her wounds She picked up the antibiotic ointment today.. She reports pain to these areas. She denies purulent drainage, increased warmth or erythema to the left lower extremity. She currently keeps the areas covered. Of note she had an Achilles tendon rupture on her right leg and is currently in a soft cast. 9/13; patient presents for follow-up. She missed her last clinic appointment. She has been using Santyl to the wound beds daily. She has no issues or complaints today. She reports the Santyl is helping clean up the wounds. She currently denies signs of infection. 9/22; patient states that 1 week ago she was seen by dermatology and they placed an Unna boot with Bactroban on her left lower extremity. Today she presents with a Unna boot in place. She currently denies signs of infection. 10/7; patient presents for follow-up. She reports improvement. She has been using Santyl to the areas of nonviable tissue and collagen to granulation tissue. She has been approved for infusions for her lupus. She is going to start these soon. These will be injection she can do at home. She currently denies signs of infection. 10/21; patient presents for follow-up. She reports continued improvement to her  wound healing. She has been using Santyl to all areas except for the superior posterior left leg wound and she is using collagen to this. She has started her injection therapy for lupus. She started 1 week ago. She currently denies signs of infection. 11/3; patient presents for follow-up. She has no issues or complaints today. She is on her third week of injection therapy for lupus and tolerating this well. She has no issues or complaints today. She denies signs of infection. 11/17; patient presents for 2-week follow-up. She reports improvement in wound healing. She denies signs of infection. 12/1; patient presents for follow-up. She has been using collagen to the anterior left lower extremity wounds and Santyl to the left lateral malleolus. She has no issues or complaints today. 12/15; patient presents for follow-up. She is using collagen to the 2 anterior left leg wounds and Santyl to the left lateral malleolus wound. She reports improvement in wound healing. She denies signs of infection. 12/22; patient presents because she is having purulent drainage from her anterior left leg wound. She reports no inciting event and states that the area started draining spontaneously. She was hoping that the drainage would stop however this has not improved. She states this started 5 days ago. She reports tenderness to the wound bed. 12/29; patient presents for follow-up. She was able to take Augmentin for 4 days but did not tolerate the GI side effects. She continues to take doxycycline. She reports improvement in drainage. She no longer has increased warmth to the surrounding wound bed. She ran out of [place] and has been using Vaseline to the wound beds. 1/6; Patient presents for follow-up. She has been taking Keflex without issues. She reports improvement in drainage and wound healing  T the left lower o extremity. Unfortunately she developed another wound spontaneously to the right anterior lower extremity.  She denies systemic signs of infection. 1/16; patient presents for follow-up. Patient completed her first prescription of Keflex and has not started the second. She reports no more drainage to the wound beds. She states there was an issue with insurance however she is going to be able to obtain Santyl soon. She has been using Dakin's wet-to-dry dressings to the right anterior leg and left lateral ankle. She has been using collagen to the distal anterior left leg wound and reports that the proximal anterior left leg wound has healed. She denies systemic signs of infection. 1/31; patient presents for follow-up. She reports improvement to the left leg wounds. She reports increased inflammation to the right leg wound. She thinks she is having a reaction to the foam border dressing on the right leg. She is unable to obtain Santyl. She has been using Dakin's wet-to-dry dressings to the right anterior leg and left lateral ankle. She has been using collagen to the distal anterior left leg wound. 2/13; patient presents for follow-up. She reports improvement in her wound healing. One of the wounds to the anterior aspect of the left lower leg has slightly reopened. She denies signs of infection. 2/27; patient presents for follow-up. She has no issues or complaints today. She denies signs of infection. 3/20; patient presents for follow-up. She has been using Medihoney to the right lower extremity wound and left lateral ankle wound. She continues to use collagen to the left anterior leg wound. She has no issues or complaints today. She denies signs of infection. She reports improvement in wound 4/10; patient has been using Medihoney to the right anterior lower leg wound and silver collagen on the left including the left ankle and left anterior. Fortunately the left anterior lower leg wound is healed. Both of the areas on the left ankle and the right anterior lower leg appear clean. These wounds are in the setting  of chronic lupus. She has had multitude of chronic wounds and a rash on her arms. She tells me she has had both her arms and legs biopsied by dermatology at [facility] and also follows with rheumatology at [facility] although she is not had an appointment this year. She does not have a history of blood clots either arterial or venous. 4/24; patient presents for follow-up. She has been using collagen to the open wound beds. She has developed new areas to her right lower leg. She currently denies signs of infection. 5/9; patient presents for follow-up. She states she is seeing her rheumatologist next week. She has been using Medihoney to all the wound beds except for the lateral left malleolus. She has been using collegen here. She reports improvement to some areas and other areas are flaring due to her lupus. She currently denies signs of infection. 5/23; Patient saw her rheumatologist last week. She has no active lupus activity at this time. Other options for treatment were given. She is going to continue with her current therapy of weekly injections. She reports improvement to Her wound healing on the left ankle. She has been putting Medihoney here. On the right lower extremity she has had a flareup and has 2 open ulcers. She currently denies signs of infection including increased warmth, erythema or purulent drainage. She has been using Medihoney here as well. 7/10; patient presents for follow-up. She was evaluated in the ED on 6/21 and prescribed Doxycycline For worsening pain  to her right lower extremity with increased erythema. She subsequently developed a systemic reaction from the antibiotic and had A diffuse rash. There was concern that her lupus is not well controlled. It was recommended that she start Belimumab. She states she is starting the infusions next week. She has using silver alginate to the wound beds. She currently denies systemic signs of infection. 7/24; patient presents for follow-up. She  started her infusion on 7/18 and tolerated this well. She has been using Medihoney to the wound beds. The anterior right leg wound has increased in size. She is currently on Keflex prescribed by her primary care physician. She denies signs of systemic infection. 8/1; patient presents for follow-up. She has been using Medihoney and Dakin's wet-to-dry dressings to the wound beds. She has no issues or complaints today. She denies signs of infection. The posterior right leg wound has healed. 8/14; patient presents for follow-up. She has been using Medihoney to the right lateral leg wound and Dakin's wet-to-dry to the right medial leg wound. She reports improvement in her chronic pain. She is scheduled to have her second infusion this week. She denies signs of infection. 8/28; patient presents for follow-up. She has been using Medihoney to the right lateral leg wound and Dakin's wet-to-dry to the right medial leg wound. 2 weeks ago she developed increased swelling to her foot and drainage to the wound beds. She was started on Keflex and has been taking this for almost 10 days. Despite this her symptoms have not improved. She has developed increased redness and warmth to the posterior leg over the past several days. She has serous drainage on exam from the wound sites on palpation. She last had her last belimumab infusion on the 18th. 9/11; patient presents for follow-up. She went to the ED after our last clinic visit. She was admitted and given IV antibiotics for concern for of abscess and cellulitis. She was discharged with Augmentin. She reports improvement in her symptoms. She has been using Medihoney and Dakin's wet-to-dry dressings. Patient History Information obtained from Patient. Family History Diabetes - Paternal Grandparents, Hypertension - Paternal Grandparents, Thyroid Problems - Maternal Grandparents,Paternal Grandparents, No family history of Cancer, Heart Disease, Hereditary Spherocytosis,  Kidney Disease, Lung Disease, Seizures, Stroke, Tuberculosis. Social History Never smoker, Marital Status - Married, Alcohol Use - Moderate, Drug Use - Current History - Marijuana, Caffeine Use - Daily - Coffee. Medical History Respiratory Patient has history of Asthma Immunological Patient has history of Lupus Erythematosus Hospitalization/Surgery History - abscess and cellulitis right lower leg 04/23/2022. Objective Constitutional respirations regular, non-labored and within target range for patient.. Vitals Time Taken: 3:35 PM, Height: 62 in, Temperature: 99 F, Pulse: 96 bpm, Respiratory Rate: 20 breaths/min, Blood Pressure: 119/84 mmHg. Cardiovascular 2+ dorsalis pedis/posterior tibialis pulses. Psychiatric pleasant and cooperative. General Notes: Right lower extremity: T the lateral aspect there is an open wound with granulation tissue. Slight tunneling at the 11 o'clock position. The medial o wound has granulation tissue throughout and no tunneling or undermining. No increased warmth or erythema to the leg. No purulent drainage. Integumentary (Hair, Skin) Wound #10 status is Open. Original cause of wound was Gradually Appeared. The date acquired was: 12/18/2021. The wound has been in treatment 20 weeks. The wound is located on the Right,Medial Lower Leg. The wound measures 1cm length x 1.8cm width x 0.3cm depth; 1.414cm^2 area and 0.424cm^3 volume. There is Fat Layer (Subcutaneous Tissue) exposed. There is no tunneling or undermining noted. There is a medium amount  of serosanguineous drainage noted. The wound margin is well defined and not attached to the wound base. There is large (67-100%) red, pink granulation within the wound bed. There is a small (1- 33%) amount of necrotic tissue within the wound bed including Adherent Slough. Wound #12 status is Open. Original cause of wound was Gradually Appeared. The date acquired was: 03/19/2022. The wound has been in treatment 7 weeks. The  wound is located on the Right,Anterior Lower Leg. The wound measures 0.7cm length x 0.6cm width x 0.3cm depth; 0.33cm^2 area and 0.099cm^3 volume. There is Fat Layer (Subcutaneous Tissue) exposed. There is no tunneling or undermining noted. There is a medium amount of serosanguineous drainage noted. The wound margin is epibole. There is small (1-33%) red granulation within the wound bed. There is a large (67-100%) amount of necrotic tissue within the wound bed including Adherent Slough. Assessment Active Problems ICD-10 Non-pressure chronic ulcer of other part of left lower leg with fat layer exposed Non-pressure chronic ulcer of other part of right lower leg with fat layer exposed Systemic lupus erythematosus, unspecified Patient was admitted to the hospital for cellulitis and abscess of the right leg on 04/23/2022 and treated with IV antibiotics and discharged on oral Augmentin. She is currently taking this. She has no signs of infection on exam. I recommended Medihoney to the wound beds. Overall she has improved greatly since I last saw her. Plan Follow-up Appointments: Return Appointment in 2 weeks. - Dr. Mikey BussingHoffman and Maryruth BunLauran Rm # 9 Anesthetic: (In clinic) Topical Lidocaine 5% applied to wound bed Bathing/ Shower/ Hygiene: May shower with protection but do not get wound dressing(s) wet. Edema Control - Lymphedema / SCD / Other: Elevate legs to the level of the heart or above for 30 minutes daily and/or when sitting, a frequency of: Avoid standing for long periods of time. The following medication(s) was prescribed: lidocaine topical 5 % gel gel topical applied only in clinic for debridements. was prescribed at facility WOUND #10: - Lower Leg Wound Laterality: Right, Medial Cleanser: Soap and Water 1 x Per Day/15 Days Discharge Instructions: May shower and wash wound with dial antibacterial soap and water prior to dressing change. Cleanser: Wound Cleanser 1 x Per Day/15 Days Discharge  Instructions: Cleanse the wound with wound cleanser prior to applying a clean dressing using gauze sponges, not tissue or cotton balls. Prim Dressing: MediHoney Gel, tube 1.5 (oz) 1 x Per Day/15 Days ary Discharge Instructions: Apply to wound bed as instructed Secondary Dressing: Bordered Gauze, 4x4 in 1 x Per Day/15 Days Discharge Instructions: Apply over primary dressing as directed. Secured With: [employer] Roll Sterile, 4.5x3.1 (in/yd) 1 x Per Day/15 Days Discharge Instructions: Secure with Kerlix as directed. Secured With: [employer] Net Size 5, 10 (yds) 1 x Per Day/15 Days WOUND #12: - Lower Leg Wound Laterality: Right, Anterior Cleanser: Soap and Water 1 x Per Day/15 Days Discharge Instructions: May shower and wash wound with dial antibacterial soap and water prior to dressing change. Cleanser: Wound Cleanser 1 x Per Day/15 Days Discharge Instructions: Cleanse the wound with wound cleanser prior to applying a clean dressing using gauze sponges, not tissue or cotton balls. Prim Dressing: MediHoney Gel, tube 1.5 (oz) 1 x Per Day/15 Days ary Discharge Instructions: Apply to wound bed as instructed Secondary Dressing: Woven Gauze Sponge, Non-Sterile 4x4 in 1 x Per Day/15 Days Discharge Instructions: Apply over primary dressing as directed. Secured With: [employer] Roll Sterile, 4.5x3.1 (in/yd) 1 x Per Day/15 Days Discharge Instructions: Secure with  Kerlix as directed. Secured With: Stretch Net Size 5, 10 (yds) 1 x Per Day/15 Days 1. Medihoney 2. Follow-up in 2 weeks Electronic Signature(s) Signed: 05/07/2022 4:17:37 PM By: Geralyn Corwin DO Entered By: Geralyn Corwin on 05/07/2022 16:16:39 -------------------------------------------------------------------------------- HxROS Details Patient Name: Date of Service: Kelly [name] [name] 05/07/2022 3:15 PM Medical Record Number: [id number] Patient Account Number: [id number] Date of Birth/Sex: Treating RN: 1996/02/11 (26 y.o. Debara Pickett,  Yvonne Kendall Primary Care Provider: Dione Booze Other Clinician: Referring Provider: Treating Provider/Extender: Gennaro Africa in Treatment: 64 Information Obtained From Patient Respiratory Medical History: Positive for: Asthma Immunological Medical History: Positive for: Lupus Erythematosus Immunizations Pneumococcal Vaccine: Received Pneumococcal Vaccination: No Implantable Devices None Hospitalization / Surgery History Type of Hospitalization/Surgery abscess and cellulitis right lower leg 04/23/2022 Family and Social History Cancer: No; Diabetes: Yes - Paternal Grandparents; Heart Disease: No; Hereditary Spherocytosis: No; Hypertension: Yes - Paternal Grandparents; Kidney Disease: No; Lung Disease: No; Seizures: No; Stroke: No; Thyroid Problems: Yes - Maternal Grandparents,Paternal Grandparents; Tuberculosis: No; Never smoker; Marital Status - Married; Alcohol Use: Moderate; Drug Use: Current History - Marijuana; Caffeine Use: Daily - Coffee; Financial Concerns: No; Food, Clothing or Shelter Needs: No; Support System Lacking: No Electronic Signature(s) Signed: 05/07/2022 4:17:37 PM By: Geralyn Corwin DO Signed: 05/07/2022 5:26:50 PM By: Shawn Stall RN, BSN Entered By: Geralyn Corwin on 05/07/2022 16:14:58 -------------------------------------------------------------------------------- SuperBill Details Patient Name: Date of Service: Kelly [name] [name] 05/07/2022 Medical Record Number: [id number] Patient Account Number: [id number] Date of Birth/Sex: Treating RN: 1996/06/04 (26 y.o. Debara Pickett, Millard.Loa Primary Care Provider: Dione Booze Other Clinician: Referring Provider: Treating Provider/Extender: Gennaro Africa in Treatment: 64 Diagnosis Coding ICD-10 Codes Code Description [phone] Non-pressure chronic ulcer of other part of left lower leg with fat layer exposed L97.812 Non-pressure chronic ulcer of other  part of right lower leg with fat layer exposed M32.9 Systemic lupus erythematosus, unspecified Facility Procedures CPT4 Code: [id number] Description: [phone] - WOUND CARE VISIT-LEV 5 EST PT Modifier: Quantity: 1 Physician Procedures : CPT4 Code Description Modifier [id number] - WC PHYS LEVEL 3 - EST PT ICD-10 Diagnosis Description L97.822 Non-pressure chronic ulcer of other part of left lower leg with fat layer exposed L97.812 Non-pressure chronic ulcer of other part of right  lower leg with fat layer exposed M32.9 Systemic lupus erythematosus, unspecified Quantity: 1 Electronic Signature(s) Signed: 05/07/2022 4:17:37 PM By: Geralyn Corwin DO Entered By: Geralyn Corwin on 05/07/2022 16:16:48

## 2022-05-07 NOTE — Progress Notes (Signed)
Kelly [name], Kelly [name] ([id number]) Visit Report for 05/07/2022 Arrival Information Details Patient Name: Date of Service: Kelly [name], Kelly [name]. 05/07/2022 3:15 PM Medical Record Number: [id number] Patient Account Number: [id number] Date of Birth/Sex: Treating RN: 04/16/1996 (26 y.o. Kelly [name], Kelly [name] Primary Care : Kelly [name] Other Clinician: Referring : Treating /Extender: Kelly [name] in Treatment: 78 Visit Information History Since Last Visit Added or deleted any medications: No Patient Arrived: Ambulatory Any new allergies or adverse reactions: No Arrival Time: 15:33 Had a fall or experienced change in No Accompanied By: self activities of daily living that may affect Transfer Assistance: None risk of falls: Patient Identification Verified: Yes Signs or symptoms of abuse/neglect since last visito No Secondary Verification Process Completed: Yes Hospitalized since last visit: No Patient Requires Transmission-Based Precautions: No Implantable device outside of the clinic excluding No Patient Has Alerts: Yes cellular tissue based products placed in the center Patient Alerts: Left ABi=1.18 since last visit: Has Dressing in Place as Prescribed: Yes Pain Present Now: No Notes see all providers this week, rheumatology, hematology, and an infusion all this friday. Electronic Signature(s) Signed: 05/07/2022 5:26:50 PM By: Kelly Pilling RN, BSN Entered By: Kelly [name] on 05/07/2022 15:35:18 -------------------------------------------------------------------------------- Clinic Level of Care Assessment Details Patient Name: Date of Service: Kelly, [name]. 05/07/2022 3:15 PM Medical Record Number: [id number] Patient Account Number: [id number] Date of Birth/Sex: Treating RN: 09-05-1995 (26 y.o. Kelly [name], [name] Primary Care : Kelly [name] Other Clinician: Referring : Treating /Extender:  Kelly [name] in Treatment: 64 Clinic Level of Care Assessment Items TOOL 4 Quantity Score X- 1 0 Use when only an EandM is performed on FOLLOW-UP visit ASSESSMENTS - Nursing Assessment / Reassessment X- 1 10 Reassessment of Co-morbidities (includes updates in patient status) X- 1 5 Reassessment of Adherence to Treatment Plan ASSESSMENTS - Wound and Skin A ssessment / Reassessment [] - 0 Simple Wound Assessment / Reassessment - one wound X- 2 5 Complex Wound Assessment / Reassessment - multiple wounds X- 1 10 Dermatologic / Skin Assessment (not related to wound area) ASSESSMENTS - Focused Assessment X- 1 5 Circumferential Edema Measurements - multi extremities X- 1 10 Nutritional Assessment / Counseling / Intervention [] - 0 Lower Extremity Assessment (monofilament, tuning fork, pulses) [] - 0 Peripheral Arterial Disease Assessment (using hand held doppler) ASSESSMENTS - Ostomy and/or Continence Assessment and Care [] - 0 Incontinence Assessment and Management [] - 0 Ostomy Care Assessment and Management (repouching, etc.) PROCESS - Coordination of Care [] - 0 Simple Patient / Family Education for ongoing care X- 1 20 Complex (extensive) Patient / Family Education for ongoing care X- 1 10 Staff obtains Programmer, systems, Records, T Results / Process Orders est X- 1 10 Staff telephones HHA, Nursing Homes / Clarify orders / etc [] - 0 Routine Transfer to another Facility (non-emergent condition) [] - 0 Routine Hospital Admission (non-emergent condition) [] - 0 New Admissions / Biomedical engineer / Ordering NPWT Apligraf, etc. , [] - 0 Emergency Hospital Admission (emergent condition) [] - 0 Simple Discharge Coordination X- 1 15 Complex (extensive) Discharge Coordination PROCESS - Special Needs [] - 0 Pediatric / Minor Patient Management [] - 0 Isolation Patient Management [] - 0 Hearing / Language / Visual special needs [] -  0 Assessment of Community assistance (transportation, D/C planning, etc.) [] - 0 Additional assistance / Altered mentation [] - 0 Support Surface(s) Assessment (bed, cushion, seat, etc.) INTERVENTIONS - Wound Cleansing / Measurement [] -  0 Simple Wound Cleansing - one wound X- 2 5 Complex Wound Cleansing - multiple wounds X- 1 5 Wound Imaging (photographs - any number of wounds) [] - 0 Wound Tracing (instead of photographs) [] - 0 Simple Wound Measurement - one wound X- 2 5 Complex Wound Measurement - multiple wounds INTERVENTIONS - Wound Dressings [] - 0 Small Wound Dressing one or multiple wounds X- 2 15 Medium Wound Dressing one or multiple wounds [] - 0 Large Wound Dressing one or multiple wounds [] - 0 Application of Medications - topical [] - 0 Application of Medications - injection INTERVENTIONS - Miscellaneous [] - 0 External ear exam [] - 0 Specimen Collection (cultures, biopsies, blood, body fluids, etc.) [] - 0 Specimen(s) / Culture(s) sent or taken to Lab for analysis [] - 0 Patient Transfer (multiple staff / Civil Service fast streamer / Similar devices) [] - 0 Simple Staple / Suture removal (25 or less) [] - 0 Complex Staple / Suture removal (26 or more) [] - 0 Hypo / Hyperglycemic Management (close monitor of Blood Glucose) [] - 0 Ankle / Brachial Index (ABI) - do not check if billed separately X- 1 5 Vital Signs Has the patient been seen at the hospital within the last three years: Yes Total Score: 165 Level Of Care: New/Established - Level 5 Electronic Signature(s) Signed: 05/07/2022 5:26:50 PM By: Kelly Pilling RN, BSN Entered By: Kelly [name] on 05/07/2022 16:02:23 -------------------------------------------------------------------------------- Encounter Discharge Information Details Patient Name: Date of Service: Kelly Lou [name] 05/07/2022 3:15 PM Medical Record Number: [id number] Patient Account Number: [id number] Date of Birth/Sex: Treating  RN: 1996-01-14 (26 y.o. Kelly [name], Kelly [name] Primary Care Kelly [name]: Kelly [name] Other Clinician: Referring Kelly [name]: Treating Kelly [name]/Extender: Kelly [name] in Treatment: 11 Encounter Discharge Information Items Discharge Condition: Stable Ambulatory Status: Ambulatory Discharge Destination: Home Transportation: Private Auto Accompanied By: self Schedule Follow-up Appointment: Yes Clinical Summary of Care: Electronic Signature(s) Signed: 05/07/2022 5:26:50 PM By: Kelly Pilling RN, BSN Entered By: Kelly [name] on 05/07/2022 16:03:34 -------------------------------------------------------------------------------- Lower Extremity Assessment Details Patient Name: Date of Service: Kelly Lou [name] 05/07/2022 3:15 PM Medical Record Number: [id number] Patient Account Number: [id number] Date of Birth/Sex: Treating RN: Oct 07, 1995 (26 y.o. Kelly [name], Kelly [name] Primary Care Sloane Junkin: Kelly [name] Other Clinician: Referring Niamya Vittitow: Treating Dorthy Magnussen/Extender: Kelly [name] in Treatment: 64 Edema Assessment Assessed: Shirlyn Goltz: No] Patrice Paradise: Yes] Edema: [Left: Yes] [Right: Yes] Calf Left: Right: Point of Measurement: 27 cm From Medial Instep 32 cm Ankle Left: Right: Point of Measurement: 8 cm From Medial Instep 24 cm Vascular Assessment Pulses: Dorsalis Pedis Palpable: [Right:Yes] Electronic Signature(s) Signed: 05/07/2022 5:26:50 PM By: Kelly Pilling RN, BSN Entered By: Kelly [name] on 05/07/2022 15:35:58 -------------------------------------------------------------------------------- Multi Wound Chart Details Patient Name: Date of Service: Kelly Lou [name] 05/07/2022 3:15 PM Medical Record Number: [id number] Patient Account Number: [id number] Date of Birth/Sex: Treating RN: 1995-10-16 (26 y.o. F) Primary Care Nattalie Santiesteban: Kelly [name] Other Clinician: Referring Shaleen Talamantez: Treating Chonte Ricke/Extender: Kelly [name] in Treatment: 64 Vital Signs Height(in): 62 Pulse(bpm): 96 Weight(lbs): Blood Pressure(mmHg): 119/84 Body Mass Index(BMI): Temperature(F): 99 Respiratory Rate(breaths/min): 20 Photos: [N/A:N/A] Right, Medial Lower Leg Right, Anterior Lower Leg N/A Wound Location: Gradually Appeared Gradually Appeared N/A Wounding Event: Lupus Auto-immune N/A Primary Etiology: Asthma, Lupus Erythematosus Asthma, Lupus Erythematosus N/A Comorbid History: 12/18/2021 03/19/2022 N/A Date Acquired: 20 7 N/A Weeks of Treatment: Open Open N/A Wound Status: No No N/A Wound Recurrence: Yes No N/A Clustered Wound: 1  N/A N/A Clustered Quantity: 1x1.8x0.3 0.7x0.6x0.3 N/A Measurements L x W x D (cm) 1.414 0.33 N/A A (cm) : rea 0.424 0.099 N/A Volume (cm) : 88.60% 41.60% N/A % Reduction in A rea: 88.60% 12.40% N/A % Reduction in Volume: Full Thickness With Exposed Support Full Thickness Without Exposed N/A Classification: Structures Support Structures Medium Medium N/A Exudate Amount: Serosanguineous Serosanguineous N/A Exudate Type: red, brown red, brown N/A Exudate Color: Well defined, not attached Epibole N/A Wound Margin: Large (67-100%) Small (1-33%) N/A Granulation Amount: Red, Pink Red N/A Granulation Quality: Small (1-33%) Large (67-100%) N/A Necrotic Amount: Fat Layer (Subcutaneous Tissue): Yes Fat Layer (Subcutaneous Tissue): Yes N/A Exposed Structures: Fascia: No Tendon: No Muscle: No Joint: No Bone: No Small (1-33%) None N/A Epithelialization: Treatment Notes Wound #10 (Lower Leg) Wound Laterality: Right, Medial Cleanser Soap and Water Discharge Instruction: May shower and wash wound with dial antibacterial soap and water prior to dressing change. Wound Cleanser Discharge Instruction: Cleanse the wound with wound cleanser prior to applying a clean dressing using gauze sponges, not tissue or cotton balls. Peri-Wound  Care Topical Primary Dressing MediHoney Gel, tube 1.5 (oz) Discharge Instruction: Apply to wound bed as instructed Secondary Dressing Bordered Gauze, 4x4 in Discharge Instruction: Apply over primary dressing as directed. Secured With [employer], 4.5x3.1 (in/yd) Discharge Instruction: Secure with Kerlix as directed. Stretch Net Size 5, 10 (yds) Compression Wrap Compression Stockings Add-Ons Wound #12 (Lower Leg) Wound Laterality: Right, Anterior Cleanser Soap and Water Discharge Instruction: May shower and wash wound with dial antibacterial soap and water prior to dressing change. Wound Cleanser Discharge Instruction: Cleanse the wound with wound cleanser prior to applying a clean dressing using gauze sponges, not tissue or cotton balls. Peri-Wound Care Topical Primary Dressing MediHoney Gel, tube 1.5 (oz) Discharge Instruction: Apply to wound bed as instructed Secondary Dressing Woven Gauze Sponge, Non-Sterile 4x4 in Discharge Instruction: Apply over primary dressing as directed. Secured With [employer], 4.5x3.1 (in/yd) Discharge Instruction: Secure with Kerlix as directed. Stretch Net Size 5, 10 (yds) Compression Wrap Compression Stockings Add-Ons Electronic Signature(s) Signed: 05/07/2022 4:17:37 PM By: Kalman Shan DO Entered By: Kalman Shan on 05/07/2022 16:12:32 -------------------------------------------------------------------------------- Multi-Disciplinary Care Plan Details Patient Name: Date of Service: Kelly Lou [name] 05/07/2022 3:15 PM Medical Record Number: [id number] Patient Account Number: [id number] Date of Birth/Sex: Treating RN: 1996/07/18 (26 y.o. Kelly [name], Kelly [name] Primary Care Kiyonna Tortorelli: Kelly [name] Other Clinician: Referring Houa Ackert: Treating Zahrah Sutherlin/Extender: Kelly [name] in Treatment: 64 [place] reviewed with physician Active Inactive Wound/Skin  Impairment Nursing Diagnoses: Impaired tissue integrity Goals: Patient/caregiver will verbalize understanding of skin care regimen Date Initiated: 02/13/2021 Target Resolution Date: 06/22/2022 Goal Status: Active Ulcer/skin breakdown will have a volume reduction of 30% by week 4 Date Initiated: 02/13/2021 Date Inactivated: 04/21/2021 Target Resolution Date: 03/13/2021 Unmet Reason: pt did not return to Goal Status: Unmet clinic for F/U Ulcer/skin breakdown will have a volume reduction of 80% by week 12 Date Initiated: 04/21/2021 Date Inactivated: 07/13/2021 Target Resolution Date: 07/27/2021 Goal Status: Met Interventions: Assess patient/caregiver ability to obtain necessary supplies Assess patient/caregiver ability to perform ulcer/skin care regimen upon admission and as needed Assess ulceration(s) every visit Provide education on ulcer and skin care Treatment Activities: Skin care regimen initiated : 02/13/2021 Topical wound management initiated : 02/13/2021 Notes: 06/29/21: Wounds 3 and 6 only at 80% volume reduction. Target date extended. Electronic Signature(s) Signed: 05/07/2022 5:26:50 PM By: Kelly Pilling RN, BSN Entered By: Kelly [name] on 05/07/2022 15:59:29 --------------------------------------------------------------------------------  Pain Assessment Details Patient Name: Date of Service: Kelly [name], Kelly [name]. 05/07/2022 3:15 PM Medical Record Number: [id number] Patient Account Number: [id number] Date of Birth/Sex: Treating RN: April 26, 1996 (26 y.o. Debby Bud Primary Care : Kelly [name] Other Clinician: Referring : Treating /Extender: Kelly [name] in Treatment: 64 Active Problems Location of Pain Severity and Description of Pain Patient Has Paino No Patient Has Paino No Site Locations Rate the pain. Current Pain Level: 0 Pain Management and Medication Current Pain Management: Medication: No Cold  Application: No Rest: No Massage: No Activity: No T.E.N.S.: No Heat Application: No Leg drop or elevation: No Is the Current Pain Management Adequate: Adequate How does your wound impact your activities of daily livingo Sleep: No Bathing: No Appetite: No Relationship With Others: No Bladder Continence: No Emotions: No Bowel Continence: No Work: No Toileting: No Drive: No Dressing: No Hobbies: No Engineer, maintenance) Signed: 05/07/2022 5:26:50 PM By: Kelly Pilling RN, BSN Entered By: Kelly [name] on 05/07/2022 15:35:40 -------------------------------------------------------------------------------- Patient/Caregiver Education Details Patient Name: Date of Service: Kelly [name] 9/11/2023andnbsp3:15 PM Medical Record Number: [id number] Patient Account Number: [id number] Date of Birth/Gender: Treating RN: 1995-10-03 (26 y.o. Debby Bud Primary Care Physician: Kelly [name] Other Clinician: Referring Physician: Treating Physician/Extender: Kelly [name] in Treatment: 25 Education Assessment Education Provided To: Patient Education Topics Provided Infection: Handouts: CDC antimicrobial patient education_English, Infection Prevention and Management Methods: Explain/Verbal Responses: State content correctly Electronic Signature(s) Signed: 05/07/2022 5:26:50 PM By: Kelly Pilling RN, BSN Entered By: Kelly [name] on 05/07/2022 15:59:45 -------------------------------------------------------------------------------- Wound Assessment Details Patient Name: Date of Service: Kelly Lou [name] 05/07/2022 3:15 PM Medical Record Number: [id number] Patient Account Number: [id number] Date of Birth/Sex: Treating RN: 05-Jul-1996 (26 y.o. F) Primary Care : Kelly [name] Other Clinician: Referring : Treating /Extender: Kelly [name] in Treatment: 64 Wound Status Wound Number:  10 Primary Etiology: Lupus Wound Location: Right, Medial Lower Leg Wound Status: Open Wounding Event: Gradually Appeared Comorbid History: Asthma, Lupus Erythematosus Date Acquired: 12/18/2021 Weeks Of Treatment: 20 Clustered Wound: Yes Photos Wound Measurements Length: (cm) 1 Width: (cm) 1.8 Depth: (cm) 0.3 Clustered Quantity: 1 Area: (cm) 1.414 Volume: (cm) 0.424 % Reduction in Area: 88.6% % Reduction in Volume: 88.6% Epithelialization: Small (1-33%) Tunneling: No Undermining: No Wound Description Classification: Full Thickness With Exposed Support Structures Wound Margin: Well defined, not attached Exudate Amount: Medium Exudate Type: Serosanguineous Exudate Color: red, brown Foul Odor After Cleansing: No Slough/Fibrino Yes Wound Bed Granulation Amount: Large (67-100%) Exposed Structure Granulation Quality: Red, Pink Fascia Exposed: No Necrotic Amount: Small (1-33%) Fat Layer (Subcutaneous Tissue) Exposed: Yes Necrotic Quality: Adherent Slough Tendon Exposed: No Muscle Exposed: No Joint Exposed: No Bone Exposed: No Treatment Notes Wound #10 (Lower Leg) Wound Laterality: Right, Medial Cleanser Soap and Water Discharge Instruction: May shower and wash wound with dial antibacterial soap and water prior to dressing change. Wound Cleanser Discharge Instruction: Cleanse the wound with wound cleanser prior to applying a clean dressing using gauze sponges, not tissue or cotton balls. Peri-Wound Care Topical Primary Dressing MediHoney Gel, tube 1.5 (oz) Discharge Instruction: Apply to wound bed as instructed Secondary Dressing Bordered Gauze, 4x4 in Discharge Instruction: Apply over primary dressing as directed. Secured With [employer], 4.5x3.1 (in/yd) Discharge Instruction: Secure with Kerlix as directed. Stretch Net Size 5, 10 (yds) Compression Wrap Compression Stockings Add-Ons Electronic Signature(s) Signed: 05/07/2022 5:12:20 PM By: Erenest Blank Entered By: Erenest Blank on 05/07/2022 15:47:32 --------------------------------------------------------------------------------  Wound Assessment Details Patient Name: Date of Service: Kelly [name], Kelly [name]. 05/07/2022 3:15 PM Medical Record Number: [id number] Patient Account Number: [id number] Date of Birth/Sex: Treating RN: 1996-07-29 (26 y.o. F) Primary Care : Kelly [name] Other Clinician: Referring : Treating /Extender: Kelly [name] in Treatment: 64 Wound Status Wound Number: 12 Primary Etiology: Auto-immune Wound Location: Right, Anterior Lower Leg Wound Status: Open Wounding Event: Gradually Appeared Comorbid History: Asthma, Lupus Erythematosus Date Acquired: 03/19/2022 Weeks Of Treatment: 7 Clustered Wound: No Photos Wound Measurements Length: (cm) 0.7 Width: (cm) 0.6 Depth: (cm) 0.3 Area: (cm) 0.33 Volume: (cm) 0.099 % Reduction in Area: 41.6% % Reduction in Volume: 12.4% Epithelialization: None Tunneling: No Undermining: No Wound Description Classification: Full Thickness Without Exposed Support Structures Wound Margin: Epibole Exudate Amount: Medium Exudate Type: Serosanguineous Exudate Color: red, brown Foul Odor After Cleansing: No Slough/Fibrino Yes Wound Bed Granulation Amount: Small (1-33%) Exposed Structure Granulation Quality: Red Fat Layer (Subcutaneous Tissue) Exposed: Yes Necrotic Amount: Large (67-100%) Necrotic Quality: Adherent Slough Treatment Notes Wound #12 (Lower Leg) Wound Laterality: Right, Anterior Cleanser Soap and Water Discharge Instruction: May shower and wash wound with dial antibacterial soap and water prior to dressing change. Wound Cleanser Discharge Instruction: Cleanse the wound with wound cleanser prior to applying a clean dressing using gauze sponges, not tissue or cotton balls. Peri-Wound Care Topical Primary Dressing MediHoney Gel, tube 1.5  (oz) Discharge Instruction: Apply to wound bed as instructed Secondary Dressing Woven Gauze Sponge, Non-Sterile 4x4 in Discharge Instruction: Apply over primary dressing as directed. Secured With [employer], 4.5x3.1 (in/yd) Discharge Instruction: Secure with Kerlix as directed. Stretch Net Size 5, 10 (yds) Compression Wrap Compression Stockings Add-Ons Electronic Signature(s) Signed: 05/07/2022 5:12:20 PM By: Erenest Blank Entered By: Erenest Blank on 05/07/2022 15:48:03 -------------------------------------------------------------------------------- Vitals Details Patient Name: Date of Service: Kelly Lou [name] 05/07/2022 3:15 PM Medical Record Number: [id number] Patient Account Number: [id number] Date of Birth/Sex: Treating RN: 09-19-1995 (26 y.o. Kelly [name], [name] Primary Care : Kelly [name] Other Clinician: Referring : Treating /Extender: Kelly [name] in Treatment: 64 Vital Signs Time Taken: 15:35 Temperature (F): 99 Height (in): 62 Pulse (bpm): 96 Respiratory Rate (breaths/min): 20 Blood Pressure (mmHg): 119/84 Reference Range: 80 - 120 mg / dl Electronic Signature(s) Signed: 05/07/2022 5:26:50 PM By: Kelly Pilling RN, BSN Signed: 05/07/2022 5:26:50 PM By: Kelly Pilling RN, BSN Entered By: Kelly [name] on 05/07/2022 15:35:31

## 2022-05-21 ENCOUNTER — Encounter (HOSPITAL_BASED_OUTPATIENT_CLINIC_OR_DEPARTMENT_OTHER): Payer: Managed Care, Other (non HMO) | Admitting: Internal Medicine

## 2022-05-21 DIAGNOSIS — M329 Systemic lupus erythematosus, unspecified: Secondary | ICD-10-CM

## 2022-05-21 DIAGNOSIS — L97812 Non-pressure chronic ulcer of other part of right lower leg with fat layer exposed: Secondary | ICD-10-CM | POA: Diagnosis not present

## 2022-05-21 DIAGNOSIS — L97822 Non-pressure chronic ulcer of other part of left lower leg with fat layer exposed: Secondary | ICD-10-CM

## 2022-05-22 NOTE — Progress Notes (Signed)
Kelly [name], Kelly [name] ([id number]) Visit Report for 05/21/2022 Arrival Information Details Patient Name: Date of Service: Kelly [name], Kelly [name] 05/21/2022 3:15 PM Medical Record Number: [id number] Patient Account Number: [id number] Date of Birth/Sex: Treating RN: 21-Dec-1995 (26 y.o. [name], [name] Primary Care Haja Crego: [name] Other Clinician: Referring Jader Desai: Treating Mathew Postiglione/Extender: Elsworth Soho in Treatment: 66 Visit Information History Since Last Visit Added or deleted any medications: No Patient Arrived: Ambulatory Any new allergies or adverse reactions: No Arrival Time: 15:47 Had a fall or experienced change in No Accompanied By: self activities of daily living that may affect Transfer Assistance: None risk of falls: Patient Requires Transmission-Based Precautions: No Signs or symptoms of abuse/neglect since last visito No Patient Has Alerts: Yes Hospitalized since last visit: No Patient Alerts: Left ABi=1.18 Has Dressing in Place as Prescribed: Yes Pain Present Now: No Electronic Signature(s) Signed: 05/22/2022 4:43:35 PM By: Erenest Blank Entered By: Erenest Blank on 05/21/2022 15:47:46 -------------------------------------------------------------------------------- Clinic Level of Care Assessment Details Patient Name: Date of Service: Kelly [name], Kelly [name]. 05/21/2022 3:15 PM Medical Record Number: [id number] Patient Account Number: [id number] Date of Birth/Sex: Treating RN: 24-Feb-1996 (26 y.o. [name], [name] Primary Care May Ozment: [name] Other Clinician: Referring Cortina Vultaggio: Treating Javelle Donigan/Extender: Elsworth Soho in Treatment: 66 Clinic Level of Care Assessment Items TOOL 4 Quantity Score X- 1 0 Use when only an EandM is performed on FOLLOW-UP visit ASSESSMENTS - Nursing Assessment / Reassessment X- 1 10 Reassessment of Co-morbidities (includes updates in patient  status) X- 1 5 Reassessment of Adherence to Treatment Plan ASSESSMENTS - Wound and Skin A ssessment / Reassessment []  - 0 Simple Wound Assessment / Reassessment - one wound X- 2 5 Complex Wound Assessment / Reassessment - multiple wounds []  - 0 Dermatologic / Skin Assessment (not related to wound area) ASSESSMENTS - Focused Assessment X- 1 5 Circumferential Edema Measurements - multi extremities []  - 0 Nutritional Assessment / Counseling / Intervention []  - 0 Lower Extremity Assessment (monofilament, tuning fork, pulses) []  - 0 Peripheral Arterial Disease Assessment (using hand held doppler) ASSESSMENTS - Ostomy and/or Continence Assessment and Care []  - 0 Incontinence Assessment and Management []  - 0 Ostomy Care Assessment and Management (repouching, etc.) PROCESS - Coordination of Care X - Simple Patient / Family Education for ongoing care 1 15 []  - 0 Complex (extensive) Patient / Family Education for ongoing care X- 1 10 Staff obtains Programmer, systems, Records, T Results / Process Orders est []  - 0 Staff telephones HHA, Nursing Homes / Clarify orders / etc []  - 0 Routine Transfer to another Facility (non-emergent condition) []  - 0 Routine Hospital Admission (non-emergent condition) []  - 0 New Admissions / Biomedical engineer / Ordering NPWT Apligraf, etc. , []  - 0 Emergency Hospital Admission (emergent condition) X- 1 10 Simple Discharge Coordination []  - 0 Complex (extensive) Discharge Coordination PROCESS - Special Needs []  - 0 Pediatric / Minor Patient Management []  - 0 Isolation Patient Management []  - 0 Hearing / Language / Visual special needs []  - 0 Assessment of Community assistance (transportation, D/C planning, etc.) []  - 0 Additional assistance / Altered mentation []  - 0 Support Surface(s) Assessment (bed, cushion, seat, etc.) INTERVENTIONS - Wound Cleansing / Measurement []  - 0 Simple Wound Cleansing - one wound X- 2 5 Complex Wound Cleansing  - multiple wounds X- 1 5 Wound Imaging (photographs - any number of wounds) []  - 0 Wound Tracing (instead of photographs) []  - 0 Simple Wound Measurement -  one wound X- 2 5 Complex Wound Measurement - multiple wounds INTERVENTIONS - Wound Dressings $RemoveBeforeD'[]'GhaRroGHDEtJiD$  - 0 Small Wound Dressing one or multiple wounds X- 2 15 Medium Wound Dressing one or multiple wounds $RemoveBeforeD'[]'aBWUlGGOwNhwCX$  - 0 Large Wound Dressing one or multiple wounds X- 1 5 Application of Medications - topical $RemoveB'[]'xCIvMkDl$  - 0 Application of Medications - injection INTERVENTIONS - Miscellaneous $RemoveBeforeD'[]'DSyXCWqCxganOu$  - 0 External ear exam $Remove'[]'hMSdIIg$  - 0 Specimen Collection (cultures, biopsies, blood, body fluids, etc.) $RemoveBefor'[]'hAcsnCUVBVmZ$  - 0 Specimen(s) / Culture(s) sent or taken to Lab for analysis $RemoveBefo'[]'BGIwEXaAgut$  - 0 Patient Transfer (multiple staff / Harrel Lemon Lift / Similar devices) $RemoveBeforeDE'[]'mtloTDSVCsiBjpf$  - 0 Simple Staple / Suture removal (25 or less) $Remove'[]'KwpLvnd$  - 0 Complex Staple / Suture removal (26 or more) $Remove'[]'nMhKVgJ$  - 0 Hypo / Hyperglycemic Management (close monitor of Blood Glucose) $RemoveBefore'[]'ckcqewsOwtLiy$  - 0 Ankle / Brachial Index (ABI) - do not check if billed separately X- 1 5 Vital Signs Has the patient been seen at the hospital within the last three years: Yes Total Score: 130 Level Of Care: New/Established - Level 4 Electronic Signature(s) Signed: 05/22/2022 4:47:31 PM By: Rhae Hammock RN Entered By: Rhae Hammock on 05/22/2022 11:32:21 -------------------------------------------------------------------------------- Encounter Discharge Information Details Patient Name: Date of Service: Kelly Lou [name] 05/21/2022 3:15 PM Medical Record Number: [id number] Patient Account Number: [id number] Date of Birth/Sex: Treating RN: 05-31-1996 (26 y.o. [name], [name] Primary Care Ena Demary: [name] Other Clinician: Referring Raymie Trani: Treating Lorris Carducci/Extender: Elsworth Soho in Treatment: 66 Encounter Discharge Information Items Discharge Condition: Stable Ambulatory Status:  Ambulatory Discharge Destination: Home Transportation: Private Auto Accompanied By: self Schedule Follow-up Appointment: Yes Clinical Summary of Care: Patient Declined Electronic Signature(s) Signed: 05/22/2022 4:47:31 PM By: Rhae Hammock RN Entered By: Rhae Hammock on 05/22/2022 11:33:50 -------------------------------------------------------------------------------- Lower Extremity Assessment Details Patient Name: Date of Service: Kelly Lou RIA Cipriano Bunker. 05/21/2022 3:15 PM Medical Record Number: [id number] Patient Account Number: [id number] Date of Birth/Sex: Treating RN: 12-04-1995 (26 y.o. [name], [name] Primary Care Marck Mcclenny: [name] Other Clinician: Referring Fynlee Rowlands: Treating Roland Prine/Extender: Elsworth Soho in Treatment: 66 Edema Assessment Assessed: Shirlyn Goltz: No] Patrice Paradise: No] Edema: [Left: Yes] [Right: Yes] Calf Left: Right: Point of Measurement: 27 cm From Medial Instep 35.1 cm Ankle Left: Right: Point of Measurement: 8 cm From Medial Instep 22 cm Vascular Assessment Pulses: Dorsalis Pedis Palpable: [Right:Yes] Electronic Signature(s) Signed: 05/22/2022 4:43:35 PM By: Erenest Blank Signed: 05/22/2022 4:47:31 PM By: Rhae Hammock RN Entered By: Erenest Blank on 05/21/2022 16:00:34 -------------------------------------------------------------------------------- Multi Wound Chart Details Patient Name: Date of Service: Kelly Lou [name] 05/21/2022 3:15 PM Medical Record Number: [id number] Patient Account Number: [id number] Date of Birth/Sex: Treating RN: 1995-11-13 (26 y.o. [name], [name] Primary Care Dashawn Bartnick: [name] Other Clinician: Referring Prudencio Velazco: Treating Tymar Polyak/Extender: Elsworth Soho in Treatment: 66 Vital Signs Height(in): 62 Pulse(bpm): 101 Weight(lbs): Blood Pressure(mmHg): 109/75 Body Mass Index(BMI): Temperature(F): 98.6 Respiratory  Rate(breaths/min): 18 Photos: [N/A:N/A] Right, Medial Lower Leg Right, Anterior Lower Leg N/A Wound Location: Gradually Appeared Gradually Appeared N/A Wounding Event: Lupus Auto-immune N/A Primary Etiology: Asthma, Lupus Erythematosus Asthma, Lupus Erythematosus N/A Comorbid History: 12/18/2021 03/19/2022 N/A Date Acquired: 22 9 N/A Weeks of Treatment: Open Open N/A Wound Status: No No N/A Wound Recurrence: Yes No N/A Clustered Wound: 1 N/A N/A Clustered Quantity: 0.2x0.2x0.1 0.2x0.3x0.3 N/A Measurements L x W x D (cm) 0.031 0.047 N/A A (cm) : rea 0.003 0.014 N/A Volume (cm) : 99.70% 91.70% N/A % Reduction in A  rea: 99.90% 87.60% N/A % Reduction in Volume: Full Thickness With Exposed Support Full Thickness Without Exposed N/A Classification: Structures Support Structures Medium Medium N/A Exudate Amount: Serosanguineous Serosanguineous N/A Exudate Type: red, brown red, brown N/A Exudate Color: Well defined, not attached Epibole N/A Wound Margin: Large (67-100%) Small (1-33%) N/A Granulation Amount: Red, Pink Red N/A Granulation Quality: Small (1-33%) Large (67-100%) N/A Necrotic Amount: Fat Layer (Subcutaneous Tissue): Yes Fat Layer (Subcutaneous Tissue): Yes N/A Exposed Structures: Fascia: No Tendon: No Muscle: No Joint: No Bone: No Small (1-33%) None N/A Epithelialization: Treatment Notes Electronic Signature(s) Signed: 05/21/2022 4:27:17 PM By: Kalman Shan DO Signed: 05/22/2022 4:47:31 PM By: Rhae Hammock RN Entered By: Kalman Shan on 05/21/2022 16:19:00 -------------------------------------------------------------------------------- Multi-Disciplinary Care Plan Details Patient Name: Date of Service: Kelly Lou [place]. 05/21/2022 3:15 PM Medical Record Number: [id number] Patient Account Number: [id number] Date of Birth/Sex: Treating RN: 29-Oct-1995 (26 y.o. [name], [name] Primary Care Seger Jani: [name] Other  Clinician: Referring Eden Toohey: Treating Alieah Brinton/Extender: Elsworth Soho in Treatment: 66 [place] reviewed with physician Active Inactive Wound/Skin Impairment Nursing Diagnoses: Impaired tissue integrity Goals: Patient/caregiver will verbalize understanding of skin care regimen Date Initiated: 02/13/2021 Target Resolution Date: 06/22/2022 Goal Status: Active Ulcer/skin breakdown will have a volume reduction of 30% by week 4 Date Initiated: 02/13/2021 Date Inactivated: 04/21/2021 Target Resolution Date: 03/13/2021 Unmet Reason: pt did not return to Goal Status: Unmet clinic for F/U Ulcer/skin breakdown will have a volume reduction of 80% by week 12 Date Initiated: 04/21/2021 Date Inactivated: 07/13/2021 Target Resolution Date: 07/27/2021 Goal Status: Met Interventions: Assess patient/caregiver ability to obtain necessary supplies Assess patient/caregiver ability to perform ulcer/skin care regimen upon admission and as needed Assess ulceration(s) every visit Provide education on ulcer and skin care Treatment Activities: Skin care regimen initiated : 02/13/2021 Topical wound management initiated : 02/13/2021 Notes: 06/29/21: Wounds 3 and 6 only at 80% volume reduction. Target date extended. Electronic Signature(s) Signed: 05/22/2022 4:47:31 PM By: Rhae Hammock RN Entered By: Rhae Hammock on 05/21/2022 15:53:10 -------------------------------------------------------------------------------- Pain Assessment Details Patient Name: Date of Service: Kelly Lou RIA Cipriano Bunker. 05/21/2022 3:15 PM Medical Record Number: [id number] Patient Account Number: [id number] Date of Birth/Sex: Treating RN: 18-Sep-1995 (26 y.o. [name], [name] Primary Care Dorman Calderwood: [name] Other Clinician: Referring Darothy Courtright: Treating Curren Mohrmann/Extender: Elsworth Soho in Treatment: 66 Active Problems Location of Pain  Severity and Description of Pain Patient Has Paino No Site Locations Pain Management and Medication Current Pain Management: Electronic Signature(s) Signed: 05/22/2022 4:43:35 PM By: Erenest Blank Signed: 05/22/2022 4:47:31 PM By: Rhae Hammock RN Entered By: Erenest Blank on 05/21/2022 15:49:51 -------------------------------------------------------------------------------- Patient/Caregiver Education Details Patient Name: Date of Service: Kelly [name] 9/25/2023andnbsp3:15 PM Medical Record Number: [id number] Patient Account Number: [id number] Date of Birth/Gender: Treating RN: 02-02-1996 (26 y.o. [name], [name] Primary Care Physician: [name] Other Clinician: Referring Physician: Treating Physician/Extender: Elsworth Soho in Treatment: 73 Education Assessment Education Provided To: Patient Education Topics Provided Wound/Skin Impairment: Methods: Explain/Verbal Responses: Reinforcements needed, State content correctly Electronic Signature(s) Signed: 05/22/2022 4:47:31 PM By: Rhae Hammock RN Entered By: Rhae Hammock on 05/21/2022 15:53:32 -------------------------------------------------------------------------------- Wound Assessment Details Patient Name: Date of Service: Kelly Lou RIA Cipriano Bunker. 05/21/2022 3:15 PM Medical Record Number: [id number] Patient Account Number: [id number] Date of Birth/Sex: Treating RN: December 05, 1995 (26 y.o. [name], [name] Primary Care Kennethia Lynes: [name] Other Clinician: Referring Kyree Adriano: Treating Seaborn Nakama/Extender: Elsworth Soho in Treatment: 66 Wound Status  Wound Number: 10 Primary Etiology: Lupus Wound Location: Right, Medial Lower Leg Wound Status: Open Wounding Event: Gradually Appeared Comorbid History: Asthma, Lupus Erythematosus Date Acquired: 12/18/2021 Weeks Of Treatment: 22 Clustered Wound: Yes Photos Wound  Measurements Length: (cm) 0.2 Width: (cm) 0.2 Depth: (cm) 0.1 Clustered Quantity: 1 Area: (cm) 0.031 Volume: (cm) 0.003 % Reduction in Area: 99.7% % Reduction in Volume: 99.9% Epithelialization: Small (1-33%) Tunneling: No Undermining: No Wound Description Classification: Full Thickness With Exposed Support Structures Wound Margin: Well defined, not attached Exudate Amount: Medium Exudate Type: Serosanguineous Exudate Color: red, brown Foul Odor After Cleansing: No Slough/Fibrino Yes Wound Bed Granulation Amount: Large (67-100%) Exposed Structure Granulation Quality: Red, Pink Fascia Exposed: No Necrotic Amount: Small (1-33%) Fat Layer (Subcutaneous Tissue) Exposed: Yes Tendon Exposed: No Muscle Exposed: No Joint Exposed: No Bone Exposed: No Treatment Notes Wound #10 (Lower Leg) Wound Laterality: Right, Medial Cleanser Soap and Water Discharge Instruction: May shower and wash wound with dial antibacterial soap and water prior to dressing change. Wound Cleanser Discharge Instruction: Cleanse the wound with wound cleanser prior to applying a clean dressing using gauze sponges, not tissue or cotton balls. Peri-Wound Care Topical Primary Dressing MediHoney Gel, tube 1.5 (oz) Discharge Instruction: Apply to wound bed as instructed Secondary Dressing Bordered Gauze, 4x4 in Discharge Instruction: Apply over primary dressing as directed. Secured With [employer], 4.5x3.1 (in/yd) Discharge Instruction: Secure with Kerlix as directed. Stretch Net Size 5, 10 (yds) Compression Wrap Compression Stockings Add-Ons Electronic Signature(s) Signed: 05/22/2022 4:47:31 PM By: Rhae Hammock RN Entered By: Rhae Hammock on 05/21/2022 16:10:31 -------------------------------------------------------------------------------- Wound Assessment Details Patient Name: Date of Service: Kelly Lou RIA Cipriano Bunker. 05/21/2022 3:15 PM Medical Record Number: [id number] Patient  Account Number: [id number] Date of Birth/Sex: Treating RN: August 14, 1996 (26 y.o. [name], [name] Primary Care Breleigh Carpino: [name] Other Clinician: Referring Lola Czerwonka: Treating Astha Probasco/Extender: Elsworth Soho in Treatment: 66 Wound Status Wound Number: 12 Primary Etiology: Auto-immune Wound Location: Right, Anterior Lower Leg Wound Status: Open Wounding Event: Gradually Appeared Comorbid History: Asthma, Lupus Erythematosus Date Acquired: 03/19/2022 Weeks Of Treatment: 9 Clustered Wound: No Photos Wound Measurements Length: (cm) 0.2 Width: (cm) 0.3 Depth: (cm) 0.3 Area: (cm) 0.047 Volume: (cm) 0.014 % Reduction in Area: 91.7% % Reduction in Volume: 87.6% Epithelialization: None Tunneling: No Undermining: No Wound Description Classification: Full Thickness Without Exposed Support Structures Wound Margin: Epibole Exudate Amount: Medium Exudate Type: Serosanguineous Exudate Color: red, brown Foul Odor After Cleansing: No Slough/Fibrino Yes Wound Bed Granulation Amount: Small (1-33%) Exposed Structure Granulation Quality: Red Fat Layer (Subcutaneous Tissue) Exposed: Yes Necrotic Amount: Large (67-100%) Necrotic Quality: Adherent Slough Treatment Notes Wound #12 (Lower Leg) Wound Laterality: Right, Anterior Cleanser Soap and Water Discharge Instruction: May shower and wash wound with dial antibacterial soap and water prior to dressing change. Wound Cleanser Discharge Instruction: Cleanse the wound with wound cleanser prior to applying a clean dressing using gauze sponges, not tissue or cotton balls. Peri-Wound Care Topical Primary Dressing MediHoney Gel, tube 1.5 (oz) Discharge Instruction: Apply to wound bed as instructed Secondary Dressing Woven Gauze Sponge, Non-Sterile 4x4 in Discharge Instruction: Apply over primary dressing as directed. Secured With [employer], 4.5x3.1 (in/yd) Discharge Instruction: Secure  with Kerlix as directed. Stretch Net Size 5, 10 (yds) Compression Wrap Compression Stockings Add-Ons Electronic Signature(s) Signed: 05/22/2022 4:43:35 PM By: Erenest Blank Signed: 05/22/2022 4:47:31 PM By: Rhae Hammock RN Entered By: Erenest Blank on 05/21/2022 16:02:30 -------------------------------------------------------------------------------- Vitals Details Patient Name: Date of Service: Kelly Lou  [name] 05/21/2022 3:15 PM Medical Record Number: [id number] Patient Account Number: [id number] Date of Birth/Sex: Treating RN: 1996/08/19 (26 y.o. [name], [name] Primary Care Khaleef Ruby: [name] Other Clinician: Referring Kindell Strada: Treating Ula Couvillon/Extender: Elsworth Soho in Treatment: 66 Vital Signs Time Taken: 15:48 Temperature (F): 98.6 Height (in): 62 Pulse (bpm): 101 Respiratory Rate (breaths/min): 18 Blood Pressure (mmHg): 109/75 Reference Range: 80 - 120 mg / dl Electronic Signature(s) Signed: 05/22/2022 4:43:35 PM By: Erenest Blank Entered By: Erenest Blank on 05/21/2022 15:49:44

## 2022-05-22 NOTE — Progress Notes (Signed)
SHERETA, CROTHERS ([id number]) Visit Report for 05/21/2022 Chief Complaint Document Details Patient Name: Date of Service: LORALI, KHAMIS 05/21/2022 3:15 PM Medical Record Number: [id number] Patient Account Number: [id number] Date of Birth/Sex: Treating RN: 12-25-1995 (26 y.o. [name], [name] Primary Care Provider: Dione Booze Other Clinician: Referring Provider: Treating Provider/Extender: Gennaro Africa in Treatment: 08 Information Obtained from: Patient Chief Complaint Bilateral lower extremity wounds 04/21/21: left lower extremity wound s/p cellulitis 09/01/21; right lower extremity wound Electronic Signature(s) Signed: 05/21/2022 4:27:17 PM By: Geralyn Corwin DO Entered By: Geralyn Corwin on 05/21/2022 16:19:10 -------------------------------------------------------------------------------- HPI Details Patient Name: Date of Service: [name] 05/21/2022 3:15 PM Medical Record Number: [id number] Patient Account Number: [id number] Date of Birth/Sex: Treating RN: 1996-04-06 (26 y.o. [name], [name] Primary Care Provider: Dione Booze Other Clinician: Referring Provider: Treating Provider/Extender: Gennaro Africa in Treatment: 45 History of Present Illness HPI Description: Admission 6/21 Ms. Rilyn Scroggs is A 26 year old female with a past medical history of systemic lupus erythematosus that presents with bilateral lower extremity wounds that started in April 2022. She has had skin issues in the past where she developed very small wounds but these healed with time. She has never had open wounds like she does on her legs. She currently reports minimal drainage to the wounds. She does have pain to these areas but has been overall stable. She denies signs of infection. She has visited the ED for this issue and was recently prescribed doxycycline for possible cellulitis. She follows with  [facility] for her SLE and is currently on Plaquenil, prednisone and CellCept. 6/27; patient presents for 1 week follow-up. She states she saw her rheumatologist on 6/22 and prednisone was increased from 10 mg to 20 mg daily. She is currently at the highest dose of Plaquenil and CellCept and is going to try an infusion later today at her regimen. She reports improvement to the wounds. She has been using collagen with dressing changes. She currently denies signs of infection. Readmission 8/26 Patient presents to clinic today for open wounds to her left lower extremity. Her previous wounds that were treated have closed. She states that on 8/9 she developed cellulitis and was treated with 2 rounds of antibiotics. Her cellulitis has resolved. She had blisters that opened and are now large wounds. She has seen her dermatologist and rheumatologist since she has systemic lupus and a hard time healing. No changes have been made to her medications. She was recommended antibiotic ointment by her dermatologist to her wounds She picked up the antibiotic ointment today.. She reports pain to these areas. She denies purulent drainage, increased warmth or erythema to the left lower extremity. She currently keeps the areas covered. Of note she had an Achilles tendon rupture on her right leg and is currently in a soft cast. 9/13; patient presents for follow-up. She missed her last clinic appointment. She has been using Santyl to the wound beds daily. She has no issues or complaints today. She reports the Santyl is helping clean up the wounds. She currently denies signs of infection. 9/22; patient states that 1 week ago she was seen by dermatology and they placed an Unna boot with Bactroban on her left lower extremity. Today she presents with a Unna boot in place. She currently denies signs of infection. 10/7; patient presents for follow-up. She reports improvement. She has been using Santyl to the areas of  nonviable tissue and collagen to granulation tissue. She  has been approved for infusions for her lupus. She is going to start these soon. These will be injection she can do at home. She currently denies signs of infection. 10/21; patient presents for follow-up. She reports continued improvement to her wound healing. She has been using Santyl to all areas except for the superior posterior left leg wound and she is using collagen to this. She has started her injection therapy for lupus. She started 1 week ago. She currently denies signs of infection. 11/3; patient presents for follow-up. She has no issues or complaints today. She is on her third week of injection therapy for lupus and tolerating this well. She has no issues or complaints today. She denies signs of infection. 11/17; patient presents for 2-week follow-up. She reports improvement in wound healing. She denies signs of infection. 12/1; patient presents for follow-up. She has been using collagen to the anterior left lower extremity wounds and Santyl to the left lateral malleolus. She has no issues or complaints today. 12/15; patient presents for follow-up. She is using collagen to the 2 anterior left leg wounds and Santyl to the left lateral malleolus wound. She reports improvement in wound healing. She denies signs of infection. 12/22; patient presents because she is having purulent drainage from her anterior left leg wound. She reports no inciting event and states that the area started draining spontaneously. She was hoping that the drainage would stop however this has not improved. She states this started 5 days ago. She reports tenderness to the wound bed. 12/29; patient presents for follow-up. She was able to take Augmentin for 4 days but did not tolerate the GI side effects. She continues to take doxycycline. She reports improvement in drainage. She no longer has increased warmth to the surrounding wound bed. She ran out of [place] and  has been using Vaseline to the wound beds. 1/6; Patient presents for follow-up. She has been taking Keflex without issues. She reports improvement in drainage and wound healing T the left lower o extremity. Unfortunately she developed another wound spontaneously to the right anterior lower extremity. She denies systemic signs of infection. 1/16; patient presents for follow-up. Patient completed her first prescription of Keflex and has not started the second. She reports no more drainage to the wound beds. She states there was an issue with insurance however she is going to be able to obtain Santyl soon. She has been using Dakin's wet-to-dry dressings to the right anterior leg and left lateral ankle. She has been using collagen to the distal anterior left leg wound and reports that the proximal anterior left leg wound has healed. She denies systemic signs of infection. 1/31; patient presents for follow-up. She reports improvement to the left leg wounds. She reports increased inflammation to the right leg wound. She thinks she is having a reaction to the foam border dressing on the right leg. She is unable to obtain Santyl. She has been using Dakin's wet-to-dry dressings to the right anterior leg and left lateral ankle. She has been using collagen to the distal anterior left leg wound. 2/13; patient presents for follow-up. She reports improvement in her wound healing. One of the wounds to the anterior aspect of the left lower leg has slightly reopened. She denies signs of infection. 2/27; patient presents for follow-up. She has no issues or complaints today. She denies signs of infection. 3/20; patient presents for follow-up. She has been using Medihoney to the right lower extremity wound and left lateral ankle wound. She continues  to use collagen to the left anterior leg wound. She has no issues or complaints today. She denies signs of infection. She reports improvement in wound 4/10; patient has  been using Medihoney to the right anterior lower leg wound and silver collagen on the left including the left ankle and left anterior. Fortunately the left anterior lower leg wound is healed. Both of the areas on the left ankle and the right anterior lower leg appear clean. These wounds are in the setting of chronic lupus. She has had multitude of chronic wounds and a rash on her arms. She tells me she has had both her arms and legs biopsied by dermatology at [facility] and also follows with rheumatology at [facility] although she is not had an appointment this year. She does not have a history of blood clots either arterial or venous. 4/24; patient presents for follow-up. She has been using collagen to the open wound beds. She has developed new areas to her right lower leg. She currently denies signs of infection. 5/9; patient presents for follow-up. She states she is seeing her rheumatologist next week. She has been using Medihoney to all the wound beds except for the lateral left malleolus. She has been using collegen here. She reports improvement to some areas and other areas are flaring due to her lupus. She currently denies signs of infection. 5/23; Patient saw her rheumatologist last week. She has no active lupus activity at this time. Other options for treatment were given. She is going to continue with her current therapy of weekly injections. She reports improvement to Her wound healing on the left ankle. She has been putting Medihoney here. On the right lower extremity she has had a flareup and has 2 open ulcers. She currently denies signs of infection including increased warmth, erythema or purulent drainage. She has been using Medihoney here as well. 7/10; patient presents for follow-up. She was evaluated in the ED on 6/21 and prescribed Doxycycline For worsening pain to her right lower extremity with increased erythema. She subsequently developed a systemic reaction from the antibiotic and had A  diffuse rash. There was concern that her lupus is not well controlled. It was recommended that she start Belimumab. She states she is starting the infusions next week. She has using silver alginate to the wound beds. She currently denies systemic signs of infection. 7/24; patient presents for follow-up. She started her infusion on 7/18 and tolerated this well. She has been using Medihoney to the wound beds. The anterior right leg wound has increased in size. She is currently on Keflex prescribed by her primary care physician. She denies signs of systemic infection. 8/1; patient presents for follow-up. She has been using Medihoney and Dakin's wet-to-dry dressings to the wound beds. She has no issues or complaints today. She denies signs of infection. The posterior right leg wound has healed. 8/14; patient presents for follow-up. She has been using Medihoney to the right lateral leg wound and Dakin's wet-to-dry to the right medial leg wound. She reports improvement in her chronic pain. She is scheduled to have her second infusion this week. She denies signs of infection. 8/28; patient presents for follow-up. She has been using Medihoney to the right lateral leg wound and Dakin's wet-to-dry to the right medial leg wound. 2 weeks ago she developed increased swelling to her foot and drainage to the wound beds. She was started on Keflex and has been taking this for almost 10 days. Despite this her symptoms have not improved.  She has developed increased redness and warmth to the posterior leg over the past several days. She has serous drainage on exam from the wound sites on palpation. She last had her last belimumab infusion on the 18th. 9/11; patient presents for follow-up. She went to the ED after our last clinic visit. She was admitted and given IV antibiotics for concern for of abscess and cellulitis. She was discharged with Augmentin. She reports improvement in her symptoms. She has been using Medihoney  and Dakin's wet-to-dry dressings. 9/25; patient presents for follow-up. She reports improvement in her wound healing however she is having drainage from the lateral aspect of her leg. There is a pinpoint opening here. She denies increased warmth, erythema or purulent drainage. She is not systemically unwell. She is no longer taking Augmentin. Electronic Signature(s) Signed: 05/21/2022 4:27:17 PM By: Geralyn Corwin DO Entered By: Geralyn Corwin on 05/21/2022 16:20:44 -------------------------------------------------------------------------------- Physical Exam Details Patient Name: Date of Service: [name] 05/21/2022 3:15 PM Medical Record Number: [id number] Patient Account Number: [id number] Date of Birth/Sex: Treating RN: 04-11-1996 (26 y.o. [name], [name] Primary Care Provider: Dione Booze Other Clinician: Referring Provider: Treating Provider/Extender: Gennaro Africa in Treatment: 66 Constitutional respirations regular, non-labored and within target range for patient.. Cardiovascular 2+ dorsalis pedis/posterior tibialis pulses. Psychiatric pleasant and cooperative. Notes Right lower extremity: T the lateral aspect there is a pinpoint open wound along the lateral aspect with yellow serous drainage. T the anterior aspect there is an o o open wound with granulation tissue. The medial wound has granulation tissue With dried Medihoney. No increased warmth or erythema to the leg. No purulent drainage. Electronic Signature(s) Signed: 05/21/2022 4:27:17 PM By: Geralyn Corwin DO Entered By: Geralyn Corwin on 05/21/2022 16:23:01 -------------------------------------------------------------------------------- Physician Orders Details Patient Name: Date of Service: [name] 05/21/2022 3:15 PM Medical Record Number: [id number] Patient Account Number: [id number] Date of Birth/Sex: Treating RN: 1996/02/27 (26 y.o. [name], [name] Primary Care Provider: Dione Booze Other Clinician: Referring Provider: Treating Provider/Extender: Gennaro Africa in Treatment: 78 Verbal / Phone Orders: No Diagnosis Coding Follow-up Appointments ppointment in 1 week. - w/ Dr. Mikey Bussing and Merla Riches # 9 Return A Anesthetic (In clinic) Topical Lidocaine 5% applied to wound bed Bathing/ Shower/ Hygiene May shower with protection but do not get wound dressing(s) wet. Edema Control - Lymphedema / SCD / Other Elevate legs to the level of the heart or above for 30 minutes daily and/or when sitting, a frequency of: Avoid standing for long periods of time. Wound Treatment Wound #10 - Lower Leg Wound Laterality: Right, Medial Cleanser: Soap and Water 1 x Per Day/15 Days Discharge Instructions: May shower and wash wound with dial antibacterial soap and water prior to dressing change. Cleanser: Wound Cleanser 1 x Per Day/15 Days Discharge Instructions: Cleanse the wound with wound cleanser prior to applying a clean dressing using gauze sponges, not tissue or cotton balls. Prim Dressing: MediHoney Gel, tube 1.5 (oz) 1 x Per Day/15 Days ary Discharge Instructions: Apply to wound bed as instructed Secondary Dressing: Bordered Gauze, 4x4 in 1 x Per Day/15 Days Discharge Instructions: Apply over primary dressing as directed. Secured With: [employer], 4.5x3.1 (in/yd) 1 x Per Day/15 Days Discharge Instructions: Secure with Kerlix as directed. Secured With: [employer] Size 5, 10 (yds) 1 x Per Day/15 Days Wound #12 - Lower Leg Wound Laterality: Right, Anterior Cleanser: Soap and Water 1 x Per Day/15 Days Discharge Instructions:  May shower and wash wound with dial antibacterial soap and water prior to dressing change. Cleanser: Wound Cleanser 1 x Per Day/15 Days Discharge Instructions: Cleanse the wound with wound cleanser prior to applying a clean dressing using gauze sponges, not tissue or  cotton balls. Prim Dressing: MediHoney Gel, tube 1.5 (oz) 1 x Per Day/15 Days ary Discharge Instructions: Apply to wound bed as instructed Secondary Dressing: Woven Gauze Sponge, Non-Sterile 4x4 in 1 x Per Day/15 Days Discharge Instructions: Apply over primary dressing as directed. Secured With: [employer], 4.5x3.1 (in/yd) 1 x Per Day/15 Days Discharge Instructions: Secure with Kerlix as directed. Secured With: [employer] Size 5, 10 (yds) 1 x Per Day/15 Days Patient Medications llergies: doxycycline A Notifications Medication Indication Start End 05/21/2022 amoxicillin-pot clavulanate DOSE 1 - oral 875 mg-125 mg tablet - 1 tablet oral twice a day x 7 days Electronic Signature(s) Signed: 05/21/2022 4:27:17 PM By: Geralyn Corwin DO Previous Signature: 05/21/2022 4:24:55 PM Version By: Geralyn Corwin DO Entered By: Geralyn Corwin on 05/21/2022 16:25:04 -------------------------------------------------------------------------------- Problem List Details Patient Name: Date of Service: [name] 05/21/2022 3:15 PM Medical Record Number: [id number] Patient Account Number: [id number] Date of Birth/Sex: Treating RN: Mar 06, 1996 (26 y.o. [name], [name] Primary Care Provider: Dione Booze Other Clinician: Referring Provider: Treating Provider/Extender: Gennaro Africa in Treatment: 66 Active Problems ICD-10 Encounter Code Description Active Date MDM Diagnosis [phone] Non-pressure chronic ulcer of other part of left lower leg with fat layer exposed11/10/2020 No Yes L97.812 Non-pressure chronic ulcer of other part of right lower leg with fat layer 06/29/2021 No Yes exposed M32.9 Systemic lupus erythematosus, unspecified 02/14/2021 No Yes Inactive Problems ICD-10 Code Description Active Date Inactive Date L97.819 Non-pressure chronic ulcer of other part of right lower leg with unspecified severity 02/13/2021 02/13/2021 L97.829  Non-pressure chronic ulcer of other part of left lower leg with unspecified severity 02/13/2021 02/13/2021 Z30.865H Unspecified open wound, left lower leg, initial encounter 04/21/2021 04/21/2021 S91.002A Unspecified open wound, left ankle, initial encounter 04/21/2021 04/21/2021 Resolved Problems ICD-10 Code Description Active Date Resolved Date L03.116 Cellulitis of left lower limb 08/24/2021 08/24/2021 Electronic Signature(s) Signed: 05/21/2022 4:27:17 PM By: Geralyn Corwin DO Entered By: Geralyn Corwin on 05/21/2022 16:18:52 -------------------------------------------------------------------------------- Progress Note Details Patient Name: Date of Service: [name] 05/21/2022 3:15 PM Medical Record Number: [id number] Patient Account Number: [id number] Date of Birth/Sex: Treating RN: [birth year] (26 y.o. [name], [name] Primary Care Provider: Dione Booze Other Clinician: Referring Provider: Treating Provider/Extender: Gennaro Africa in Treatment: 66 Subjective Chief Complaint Information obtained from Patient Bilateral lower extremity wounds 04/21/21: left lower extremity wound s/p cellulitis 09/01/21; right lower extremity wound History of Present Illness (HPI) Admission 6/21 Ms. Aletheia Tangredi is A 25 year old female with a past medical history of systemic lupus erythematosus that presents with bilateral lower extremity wounds that started in April 2022. She has had skin issues in the past where she developed very small wounds but these healed with time. She has never had open wounds like she does on her legs. She currently reports minimal drainage to the wounds. She does have pain to these areas but has been overall stable. She denies signs of infection. She has visited the ED for this issue and was recently prescribed doxycycline for possible cellulitis. She follows with [facility] for her SLE and is currently on Plaquenil,  prednisone and CellCept. 6/27; patient presents for 1 week follow-up. She states she saw her rheumatologist on 6/22  and prednisone was increased from 10 mg to 20 mg daily. She is currently at the highest dose of Plaquenil and CellCept and is going to try an infusion later today at her regimen. She reports improvement to the wounds. She has been using collagen with dressing changes. She currently denies signs of infection. Readmission 8/26 Patient presents to clinic today for open wounds to her left lower extremity. Her previous wounds that were treated have closed. She states that on 8/9 she developed cellulitis and was treated with 2 rounds of antibiotics. Her cellulitis has resolved. She had blisters that opened and are now large wounds. She has seen her dermatologist and rheumatologist since she has systemic lupus and a hard time healing. No changes have been made to her medications. She was recommended antibiotic ointment by her dermatologist to her wounds She picked up the antibiotic ointment today.. She reports pain to these areas. She denies purulent drainage, increased warmth or erythema to the left lower extremity. She currently keeps the areas covered. Of note she had an Achilles tendon rupture on her right leg and is currently in a soft cast. 9/13; patient presents for follow-up. She missed her last clinic appointment. She has been using Santyl to the wound beds daily. She has no issues or complaints today. She reports the Santyl is helping clean up the wounds. She currently denies signs of infection. 9/22; patient states that 1 week ago she was seen by dermatology and they placed an Unna boot with Bactroban on her left lower extremity. Today she presents with a Unna boot in place. She currently denies signs of infection. 10/7; patient presents for follow-up. She reports improvement. She has been using Santyl to the areas of nonviable tissue and collagen to granulation tissue. She has been  approved for infusions for her lupus. She is going to start these soon. These will be injection she can do at home. She currently denies signs of infection. 10/21; patient presents for follow-up. She reports continued improvement to her wound healing. She has been using Santyl to all areas except for the superior posterior left leg wound and she is using collagen to this. She has started her injection therapy for lupus. She started 1 week ago. She currently denies signs of infection. 11/3; patient presents for follow-up. She has no issues or complaints today. She is on her third week of injection therapy for lupus and tolerating this well. She has no issues or complaints today. She denies signs of infection. 11/17; patient presents for 2-week follow-up. She reports improvement in wound healing. She denies signs of infection. 12/1; patient presents for follow-up. She has been using collagen to the anterior left lower extremity wounds and Santyl to the left lateral malleolus. She has no issues or complaints today. 12/15; patient presents for follow-up. She is using collagen to the 2 anterior left leg wounds and Santyl to the left lateral malleolus wound. She reports improvement in wound healing. She denies signs of infection. 12/22; patient presents because she is having purulent drainage from her anterior left leg wound. She reports no inciting event and states that the area started draining spontaneously. She was hoping that the drainage would stop however this has not improved. She states this started 5 days ago. She reports tenderness to the wound bed. 12/29; patient presents for follow-up. She was able to take Augmentin for 4 days but did not tolerate the GI side effects. She continues to take doxycycline. She reports improvement in drainage. She no  longer has increased warmth to the surrounding wound bed. She ran out of [place] and has been using Vaseline to the wound beds. 1/6; Patient presents  for follow-up. She has been taking Keflex without issues. She reports improvement in drainage and wound healing T the left lower o extremity. Unfortunately she developed another wound spontaneously to the right anterior lower extremity. She denies systemic signs of infection. 1/16; patient presents for follow-up. Patient completed her first prescription of Keflex and has not started the second. She reports no more drainage to the wound beds. She states there was an issue with insurance however she is going to be able to obtain Santyl soon. She has been using Dakin's wet-to-dry dressings to the right anterior leg and left lateral ankle. She has been using collagen to the distal anterior left leg wound and reports that the proximal anterior left leg wound has healed. She denies systemic signs of infection. 1/31; patient presents for follow-up. She reports improvement to the left leg wounds. She reports increased inflammation to the right leg wound. She thinks she is having a reaction to the foam border dressing on the right leg. She is unable to obtain Santyl. She has been using Dakin's wet-to-dry dressings to the right anterior leg and left lateral ankle. She has been using collagen to the distal anterior left leg wound. 2/13; patient presents for follow-up. She reports improvement in her wound healing. One of the wounds to the anterior aspect of the left lower leg has slightly reopened. She denies signs of infection. 2/27; patient presents for follow-up. She has no issues or complaints today. She denies signs of infection. 3/20; patient presents for follow-up. She has been using Medihoney to the right lower extremity wound and left lateral ankle wound. She continues to use collagen to the left anterior leg wound. She has no issues or complaints today. She denies signs of infection. She reports improvement in wound 4/10; patient has been using Medihoney to the right anterior lower leg wound and silver  collagen on the left including the left ankle and left anterior. Fortunately the left anterior lower leg wound is healed. Both of the areas on the left ankle and the right anterior lower leg appear clean. These wounds are in the setting of chronic lupus. She has had multitude of chronic wounds and a rash on her arms. She tells me she has had both her arms and legs biopsied by dermatology at [facility] and also follows with rheumatology at [facility] although she is not had an appointment this year. She does not have a history of blood clots either arterial or venous. 4/24; patient presents for follow-up. She has been using collagen to the open wound beds. She has developed new areas to her right lower leg. She currently denies signs of infection. 5/9; patient presents for follow-up. She states she is seeing her rheumatologist next week. She has been using Medihoney to all the wound beds except for the lateral left malleolus. She has been using collegen here. She reports improvement to some areas and other areas are flaring due to her lupus. She currently denies signs of infection. 5/23; Patient saw her rheumatologist last week. She has no active lupus activity at this time. Other options for treatment were given. She is going to continue with her current therapy of weekly injections. She reports improvement to Her wound healing on the left ankle. She has been putting Medihoney here. On the right lower extremity she has had a flareup and has  2 open ulcers. She currently denies signs of infection including increased warmth, erythema or purulent drainage. She has been using Medihoney here as well. 7/10; patient presents for follow-up. She was evaluated in the ED on 6/21 and prescribed Doxycycline For worsening pain to her right lower extremity with increased erythema. She subsequently developed a systemic reaction from the antibiotic and had A diffuse rash. There was concern that her lupus is not well controlled.  It was recommended that she start Belimumab. She states she is starting the infusions next week. She has using silver alginate to the wound beds. She currently denies systemic signs of infection. 7/24; patient presents for follow-up. She started her infusion on 7/18 and tolerated this well. She has been using Medihoney to the wound beds. The anterior right leg wound has increased in size. She is currently on Keflex prescribed by her primary care physician. She denies signs of systemic infection. 8/1; patient presents for follow-up. She has been using Medihoney and Dakin's wet-to-dry dressings to the wound beds. She has no issues or complaints today. She denies signs of infection. The posterior right leg wound has healed. 8/14; patient presents for follow-up. She has been using Medihoney to the right lateral leg wound and Dakin's wet-to-dry to the right medial leg wound. She reports improvement in her chronic pain. She is scheduled to have her second infusion this week. She denies signs of infection. 8/28; patient presents for follow-up. She has been using Medihoney to the right lateral leg wound and Dakin's wet-to-dry to the right medial leg wound. 2 weeks ago she developed increased swelling to her foot and drainage to the wound beds. She was started on Keflex and has been taking this for almost 10 days. Despite this her symptoms have not improved. She has developed increased redness and warmth to the posterior leg over the past several days. She has serous drainage on exam from the wound sites on palpation. She last had her last belimumab infusion on the 18th. 9/11; patient presents for follow-up. She went to the ED after our last clinic visit. She was admitted and given IV antibiotics for concern for of abscess and cellulitis. She was discharged with Augmentin. She reports improvement in her symptoms. She has been using Medihoney and Dakin's wet-to-dry dressings. 9/25; patient presents for  follow-up. She reports improvement in her wound healing however she is having drainage from the lateral aspect of her leg. There is a pinpoint opening here. She denies increased warmth, erythema or purulent drainage. She is not systemically unwell. She is no longer taking Augmentin. Patient History Information obtained from Patient. Family History Diabetes - Paternal Grandparents, Hypertension - Paternal Grandparents, Thyroid Problems - Maternal Grandparents,Paternal Grandparents, No family history of Cancer, Heart Disease, Hereditary Spherocytosis, Kidney Disease, Lung Disease, Seizures, Stroke, Tuberculosis. Social History Never smoker, Marital Status - Married, Alcohol Use - Moderate, Drug Use - Current History - Marijuana, Caffeine Use - Daily - Coffee. Medical History Respiratory Patient has history of Asthma Immunological Patient has history of Lupus Erythematosus Hospitalization/Surgery History - abscess and cellulitis right lower leg 04/23/2022. Objective Constitutional respirations regular, non-labored and within target range for patient.. Vitals Time Taken: 3:48 PM, Height: 62 in, Temperature: 98.6 F, Pulse: 101 bpm, Respiratory Rate: 18 breaths/min, Blood Pressure: 109/75 mmHg. Cardiovascular 2+ dorsalis pedis/posterior tibialis pulses. Psychiatric pleasant and cooperative. General Notes: Right lower extremity: T the lateral aspect there is a pinpoint open wound along the lateral aspect with yellow serous drainage. T the anterior o o  aspect there is an open wound with granulation tissue. The medial wound has granulation tissue With dried Medihoney. No increased warmth or erythema to the leg. No purulent drainage. Integumentary (Hair, Skin) Wound #10 status is Open. Original cause of wound was Gradually Appeared. The date acquired was: 12/18/2021. The wound has been in treatment 22 weeks. The wound is located on the Right,Medial Lower Leg. The wound measures 0.2cm length x  0.2cm width x 0.1cm depth; 0.031cm^2 area and 0.003cm^3 volume. There is Fat Layer (Subcutaneous Tissue) exposed. There is no tunneling or undermining noted. There is a medium amount of serosanguineous drainage noted. The wound margin is well defined and not attached to the wound base. There is large (67-100%) red, pink granulation within the wound bed. There is a small (1- 33%) amount of necrotic tissue within the wound bed. Wound #12 status is Open. Original cause of wound was Gradually Appeared. The date acquired was: 03/19/2022. The wound has been in treatment 9 weeks. The wound is located on the Right,Anterior Lower Leg. The wound measures 0.2cm length x 0.3cm width x 0.3cm depth; 0.047cm^2 area and 0.014cm^3 volume. There is Fat Layer (Subcutaneous Tissue) exposed. There is no tunneling or undermining noted. There is a medium amount of serosanguineous drainage noted. The wound margin is epibole. There is small (1-33%) red granulation within the wound bed. There is a large (67-100%) amount of necrotic tissue within the wound bed including Adherent Slough. 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 She does have a pinpoint open wound that is draining yellow serous fluid to the lateral aspect of the right leg. She was just treated for cellulitis with IV antibiotics and discharged from the hospital with Augmentin. She has completed this however I will go ahead and extend it due to this symptom. I recommended Medihoney to the wound beds. Follow-up in 1 week. Plan Follow-up Appointments: Return Appointment in 1 week. - w/ Dr. Mikey Bussing and Maryruth Bun Rm # 9 Anesthetic: (In clinic) Topical Lidocaine 5% applied to wound bed Bathing/ Shower/ Hygiene: May shower with  protection but do not get wound dressing(s) wet. Edema Control - Lymphedema / SCD / Other: Elevate legs to the level of the heart or above for 30 minutes daily and/or when sitting, a frequency of: Avoid standing for long periods of time. The following medication(s) was prescribed: amoxicillin-pot clavulanate oral 875 mg-125 mg tablet 1 1 tablet oral twice a day x 7 days starting 05/21/2022 WOUND #10: - Lower Leg Wound Laterality: Right, Medial Cleanser: Soap and Water 1 x Per Day/15 Days Discharge Instructions: May shower and wash wound with dial antibacterial soap and water prior to dressing change. Cleanser: Wound Cleanser 1 x Per Day/15 Days Discharge Instructions: Cleanse the wound with wound cleanser prior to applying a clean dressing using gauze sponges, not tissue or cotton balls. Prim Dressing: MediHoney Gel, tube 1.5 (oz) 1 x Per Day/15 Days ary Discharge Instructions: Apply to wound bed as instructed Secondary Dressing: Bordered Gauze, 4x4 in 1 x Per Day/15 Days Discharge Instructions: Apply over primary dressing as directed. Secured With: [employer], 4.5x3.1 (in/yd) 1 x Per Day/15 Days Discharge Instructions: Secure with Kerlix as directed. Secured With: [employer] Size 5, 10 (yds) 1 x Per Day/15 Days WOUND #12: - Lower Leg Wound Laterality: Right, Anterior Cleanser:  Soap and Water 1 x Per Day/15 Days Discharge Instructions: May shower and wash wound with dial antibacterial soap and water prior to dressing change. Cleanser: Wound Cleanser 1 x Per Day/15 Days Discharge Instructions: Cleanse the wound with wound cleanser prior to applying a clean dressing using gauze sponges, not tissue or cotton balls. Prim Dressing: MediHoney Gel, tube 1.5 (oz) 1 x Per Day/15 Days ary Discharge Instructions: Apply to wound bed as instructed Secondary Dressing: Woven Gauze Sponge, Non-Sterile 4x4 in 1 x Per Day/15 Days Discharge Instructions: Apply over primary dressing as  directed. Secured With: [employer], 4.5x3.1 (in/yd) 1 x Per Day/15 Days Discharge Instructions: Secure with Kerlix as directed. Secured With: St[employer] Net Size 5, 10 (yds) 1 x Per Day/15 Days 1. Medihoney 2. Augmentin 3. Follow-up in 1 week Electronic Signature(s) Signed: 05/21/2022 4:27:17 PM By: Geralyn Corwin DO Entered By: Geralyn Corwin on 05/21/2022 16:26:22 -------------------------------------------------------------------------------- HxROS Details Patient Name: Date of Service: [name] 05/21/2022 3:15 PM Medical Record Number: [id number] Patient Account Number: [id number] Date of Birth/Sex: Treating RN: Feb 23, 1996 (26 y.o. [name], [name] Primary Care Provider: Dione Booze Other Clinician: Referring Provider: Treating Provider/Extender: Gennaro Africa in Treatment: 66 Information Obtained From Patient Respiratory Medical History: Positive for: Asthma Immunological Medical History: Positive for: Lupus Erythematosus Immunizations Pneumococcal Vaccine: Received Pneumococcal Vaccination: No Implantable Devices None Hospitalization / Surgery History Type of Hospitalization/Surgery abscess and cellulitis right lower leg 04/23/2022 Family and Social History Cancer: No; Diabetes: Yes - Paternal Grandparents; Heart Disease: No; Hereditary Spherocytosis: No; Hypertension: Yes - Paternal Grandparents; Kidney Disease: No; Lung Disease: No; Seizures: No; Stroke: No; Thyroid Problems: Yes - Maternal Grandparents,Paternal Grandparents; Tuberculosis: No; Never smoker; Marital Status - Married; Alcohol Use: Moderate; Drug Use: Current History - Marijuana; Caffeine Use: Daily - Coffee; Financial Concerns: No; Food, Clothing or Shelter Needs: No; Support System Lacking: No Electronic Signature(s) Signed: 05/21/2022 4:27:17 PM By: Geralyn Corwin DO Signed: 05/22/2022 4:47:31 PM By: Fonnie Mu RN Entered By: Geralyn Corwin on 05/21/2022 16:20:48 -------------------------------------------------------------------------------- SuperBill Details Patient Name: Date of Service: [name] 05/21/2022 Medical Record Number: [id number] Patient Account Number: [id number] Date of Birth/Sex: Treating RN: 10-03-1995 (26 y.o. [name], [name] Primary Care Provider: Dione Booze Other Clinician: Referring Provider: Treating Provider/Extender: Gennaro Africa in Treatment: 66 Diagnosis Coding ICD-10 Codes Code Description [id number] Non-pressure chronic ulcer of other part of left lower leg with fat layer exposed L97.812 Non-pressure chronic ulcer of other part of right lower leg with fat layer exposed M32.9 Systemic lupus erythematosus, unspecified Facility Procedures CPT4 Code: 25956387 Description: 99214 - WOUND CARE VISIT-LEV 4 EST PT Modifier: Quantity: 1 Physician Procedures : CPT4 Code Description Modifier [id number] 99214 - WC PHYS LEVEL 4 - EST PT ICD-10 Diagnosis Description L97.822 Non-pressure chronic ulcer of other part of left lower leg with fat layer exposed L97.812 Non-pressure chronic ulcer of other part of right  lower leg with fat layer exposed M32.9 Systemic lupus erythematosus, unspecified Quantity: 1 Electronic Signature(s) Signed: 05/22/2022 4:43:30 PM By: Geralyn Corwin DO Signed: 05/22/2022 4:47:31 PM By: Fonnie Mu RN Previous Signature: 05/21/2022 4:27:17 PM Version By: Geralyn Corwin DO Entered By: Fonnie Mu on 05/22/2022 11:32:30

## 2022-05-25 ENCOUNTER — Emergency Department (HOSPITAL_BASED_OUTPATIENT_CLINIC_OR_DEPARTMENT_OTHER)
Admission: EM | Admit: 2022-05-25 | Discharge: 2022-05-25 | Disposition: A | Discharge status: Home / Self Care | Payer: Managed Care, Other (non HMO) | Attending: Emergency Medicine | Admitting: Emergency Medicine

## 2022-05-25 ENCOUNTER — Encounter (HOSPITAL_BASED_OUTPATIENT_CLINIC_OR_DEPARTMENT_OTHER): Payer: Self-pay

## 2022-05-25 ENCOUNTER — Other Ambulatory Visit: Payer: Self-pay

## 2022-05-25 ENCOUNTER — Emergency Department (HOSPITAL_BASED_OUTPATIENT_CLINIC_OR_DEPARTMENT_OTHER): Payer: Managed Care, Other (non HMO) | Admitting: Radiology

## 2022-05-25 DIAGNOSIS — R0789 Other chest pain: Secondary | ICD-10-CM | POA: Insufficient documentation

## 2022-05-25 DIAGNOSIS — R079 Chest pain, unspecified: Secondary | ICD-10-CM

## 2022-05-25 LAB — CBC WITH DIFFERENTIAL/PLATELET
Abs Immature Granulocytes: 0.02 10*3/uL (ref 0.00–0.07)
Basophils Absolute: 0 10*3/uL (ref 0.0–0.1)
Basophils Relative: 0 %
Eosinophils Absolute: 0.1 10*3/uL (ref 0.0–0.5)
Eosinophils Relative: 1 %
HCT: 39.1 % (ref 36.0–46.0)
Hemoglobin: 13.2 g/dL (ref 12.0–15.0)
Immature Granulocytes: 0 %
Lymphocytes Relative: 14 %
Lymphs Abs: 1 10*3/uL (ref 0.7–4.0)
MCH: 30.3 pg (ref 26.0–34.0)
MCHC: 33.8 g/dL (ref 30.0–36.0)
MCV: 89.9 fL (ref 80.0–100.0)
Monocytes Absolute: 0.4 10*3/uL (ref 0.1–1.0)
Monocytes Relative: 6 %
Neutro Abs: 5.5 10*3/uL (ref 1.7–7.7)
Neutrophils Relative %: 79 %
Platelets: 292 10*3/uL (ref 150–400)
RBC: 4.35 MIL/uL (ref 3.87–5.11)
RDW: 13.8 % (ref 11.5–15.5)
WBC: 7 10*3/uL (ref 4.0–10.5)
nRBC: 0 % (ref 0.0–0.2)

## 2022-05-25 LAB — COMPREHENSIVE METABOLIC PANEL
ALT: 12 U/L (ref 0–44)
AST: 15 U/L (ref 15–41)
Albumin: 3.8 g/dL (ref 3.5–5.0)
Alkaline Phosphatase: 45 U/L (ref 38–126)
Anion gap: 12 (ref 5–15)
BUN: 10 mg/dL (ref 6–20)
CO2: 22 mmol/L (ref 22–32)
Calcium: 9.6 mg/dL (ref 8.9–10.3)
Chloride: 105 mmol/L (ref 98–111)
Creatinine, Ser: 0.65 mg/dL (ref 0.44–1.00)
GFR, Estimated: >60 mL/min (ref >60)
Glucose, Bld: 96 mg/dL (ref 70–99)
Potassium: 3.9 mmol/L (ref 3.5–5.1)
Sodium: 139 mmol/L (ref 135–145)
Total Bilirubin: 0.5 mg/dL (ref 0.3–1.2)
Total Protein: 8.1 g/dL (ref 6.5–8.1)

## 2022-05-25 LAB — D-DIMER, QUANTITATIVE: D-Dimer, Quant: 0.46 ug/mL-FEU (ref 0.00–0.50)

## 2022-05-25 LAB — TROPONIN I (HIGH SENSITIVITY): Troponin I (High Sensitivity): <2 ng/L (ref <18)

## 2022-05-25 NOTE — ED Triage Notes (Signed)
Woke up ~4am with sternal chest pain worse on inspiration. Pain radiates to back and aggravated with movement. No apparent distress. A/o x 4. Ambulatory.

## 2022-05-25 NOTE — ED Provider Notes (Signed)
[facility] EMERGENCY DEPT Provider Note   CSN: [id number] Arrival date & time: 05/25/22  0549     History {Add pertinent medical, surgical, social history, OB history to HPI:1} Chief Complaint  Patient presents with   Chest Pain    Kelly [name] is a 26 y.o. female.  HPI     2-3 AM, Worse moving side to side Sharp and tightness, worse with deep breaths No radiation, under left breast Dyspnea, feels much better now NO nausea, vomiting, diarrhea, abdominal pain No fever, cough Leg wounds healing No fam hx of early CAD No smoking, other drugs. Etoh occ  Home Medications Prior to Admission medications   Medication Sig Start Date End Date Taking? Authorizing Provider  ascorbic acid (VITAMIN C) 500 MG tablet Take 1 tablet (500 mg total) by mouth daily. 04/27/22   Mariel Aloe, MD  azaTHIOprine (IMURAN) 50 MG tablet Take 50 mg by mouth daily. 01/30/22   [provider]  Calcium Carb-Cholecalciferol (CALCIUM 600-D) 600-10 MG-MCG TABS 1 tablet with a meal Orally Once a day for 30 day(s)    [provider]  Control Gel Formula Dressing (DUODERM CGF EXTRA THIN EX) Apply topically.    [provider]  enoxaparin (LOVENOX) 60 MG/0.6ML injection Inject 60 mg into the skin every 12 (twelve) hours. 03/23/22 09/19/22  [provider]  HYDROcodone-acetaminophen (NORCO/VICODIN) 5-325 MG tablet Take 1 tablet by mouth every 6 (six) hours as needed for moderate pain. 04/26/22   Mariel Aloe, MD  hydroxychloroquine (PLAQUENIL) 200 MG tablet Take 400 mg by mouth daily.    [provider]  molnupiravir EUA (LAGEVRIO) 200 mg CAPS capsule Take 4 capsules (800 mg total) by mouth 2 (two) times daily. Complete bottle given to you at discharge. 04/26/22   Mariel Aloe, MD  predniSONE (DELTASONE) 5 MG tablet Take 5-25 mg by mouth as directed. Taking 25 mg daily currently until on the  02-21-22 will take 20 mg for a week, then titrate down 5  mg each week until taking 5 mg daily. 07/04/21   [provider]  zinc sulfate 220 (50 Zn) MG capsule Take 1 capsule (220 mg total) by mouth daily. 04/27/22   Mariel Aloe, MD      Allergies    Doxycycline    Review of Systems   Review of Systems  Physical Exam Updated Vital Signs BP 113/80   Pulse 84   Temp 98.4 F (36.9 C)   Resp (!) 24   Ht 5\' 3"  (1.6 m)   Wt 59 kg   LMP 02/04/2022 (Approximate) Comment: irregular  SpO2 100%   BMI 23.03 kg/m  Physical Exam  ED Results / Procedures / Treatments   Labs (all labs ordered are listed, but only abnormal results are displayed) Labs Reviewed  CBC WITH DIFFERENTIAL/PLATELET  COMPREHENSIVE METABOLIC PANEL  D-DIMER, QUANTITATIVE  TROPONIN I (HIGH SENSITIVITY)  TROPONIN I (HIGH SENSITIVITY)    EKG None  Radiology DG Chest 2 View  Result Date: 05/25/2022 CLINICAL DATA:  26 year old female with chest pain and shortness of breath. Inspiratory pain. EXAM: CHEST - 2 VIEW COMPARISON:  Portable chest 01/14/2019 and earlier. FINDINGS: Somewhat low lung volumes have not significantly changed since 2016. Mediastinal contours are within normal limits. Visualized tracheal air column is within normal limits. EKG button artifact in both lungs. Lung markings appear stable and within normal limits. No pneumothorax or pleural effusion. Mild dextroconvex scoliosis. No acute osseous abnormality identified. Negative visible bowel  gas. IMPRESSION: No acute cardiopulmonary abnormality. Electronically Signed   By: Odessa Fleming M.D.   On: 05/25/2022 06:51    Procedures Procedures  {Document cardiac monitor, telemetry assessment procedure when appropriate:1}  Medications Ordered in ED Medications - No data to display  ED Course/ Medical Decision Making/ A&P                           Medical Decision Making Amount and/or Complexity of Data Reviewed Labs: ordered. Radiology: ordered. ECG/medicine tests: ordered.   ***  {Document  critical care time when appropriate:1} {Document review of labs and clinical decision tools ie heart score, Chads2Vasc2 etc:1}  {Document your independent review of radiology images, and any outside records:1} {Document your discussion with family members, caretakers, and with consultants:1} {Document social determinants of health affecting pt's care:1} {Document your decision making why or why not admission, treatments were needed:1} Final Clinical Impression(s) / ED Diagnoses Final diagnoses:  None    Rx / DC Orders ED Discharge Orders     None

## 2022-05-25 NOTE — ED Notes (Signed)
Pt d/c home per MD order. Discharge summary reviewed, pt verbalizes understanding. Ambulatory. No s/s of acute distress noted at discharge.  

## 2022-05-28 ENCOUNTER — Encounter (HOSPITAL_BASED_OUTPATIENT_CLINIC_OR_DEPARTMENT_OTHER): Payer: Managed Care, Other (non HMO) | Attending: Internal Medicine | Admitting: Internal Medicine

## 2022-05-28 DIAGNOSIS — L97812 Non-pressure chronic ulcer of other part of right lower leg with fat layer exposed: Secondary | ICD-10-CM | POA: Diagnosis not present

## 2022-05-28 DIAGNOSIS — L97822 Non-pressure chronic ulcer of other part of left lower leg with fat layer exposed: Secondary | ICD-10-CM | POA: Insufficient documentation

## 2022-05-28 DIAGNOSIS — Z79624 Long term (current) use of inhibitors of nucleotide synthesis: Secondary | ICD-10-CM | POA: Diagnosis not present

## 2022-05-28 DIAGNOSIS — M329 Systemic lupus erythematosus, unspecified: Secondary | ICD-10-CM | POA: Diagnosis not present

## 2022-05-28 NOTE — Progress Notes (Signed)
Kelly [name] ([id number]) Visit Report for 05/28/2022 Chief Complaint Document Details Patient Name: Date of Service: Kelly [name], Kelly [name] 05/28/2022 3:15 PM Medical Record Number: [id number] Patient Account Number: [id number] Date of Birth/Sex: Treating RN: Apr 01, 1996 (26 y.o. Kelly [name], Kelly [name] Primary Care Provider: Dione [name] Other Clinician: Referring Provider: Treating Provider/Extender: Kelly [name] in Treatment: 73 Information Obtained from: Patient Chief Complaint Bilateral lower extremity wounds 04/21/21: left lower extremity wound s/p cellulitis 09/01/21; right lower extremity wound Electronic Signature(s) Signed: 05/28/2022 4:32:49 PM By: Geralyn Corwin DO Entered By: Geralyn Corwin on 05/28/2022 16:03:53 -------------------------------------------------------------------------------- HPI Details Patient Name: Date of Service: Kelly [name] Kelly H M. 05/28/2022 3:15 PM Medical Record Number: [id number] Patient Account Number: [id number] Date of Birth/Sex: Treating RN: Jul 13, 1996 (26 y.o. Kelly [name], Kelly [name] Primary Care Provider: Dione [name] Other Clinician: Referring Provider: Treating Provider/Extender: Kelly [name] in Treatment: 77 History of Present Illness HPI Description: Admission 6/21 [name] with a past medical history of systemic lupus erythematosus that presents with bilateral lower extremity wounds that started in April 2022. She has had skin issues in the past where she developed very small wounds but these healed with time. She has never had open wounds like she does on her legs. She currently reports minimal drainage to the wounds. She does have pain to these areas but has been overall stable. She denies signs of infection. She has visited the ED for this issue and was recently prescribed doxycycline for possible cellulitis. She follows with  Kelly [name] for her SLE and is currently on Plaquenil, prednisone and CellCept. 6/27; patient presents for 1 week follow-up. She states she saw her rheumatologist on 6/22 and prednisone was increased from 10 mg to 20 mg daily. She is currently at the highest dose of Plaquenil and CellCept and is going to try an infusion later today at her regimen. She reports improvement to the wounds. She has been using collagen with dressing changes. She currently denies signs of infection. Readmission 8/26 Patient presents to clinic today for open wounds to her left lower extremity. Her previous wounds that were treated have closed. She states that on 8/9 she developed cellulitis and was treated with 2 rounds of antibiotics. Her cellulitis has resolved. She had blisters that opened and are now large wounds. She has seen her dermatologist and rheumatologist since she has systemic lupus and a hard time healing. No changes have been made to her medications. She was recommended antibiotic ointment by her dermatologist to her wounds She picked up the antibiotic ointment today.. She reports pain to these areas. She denies purulent drainage, increased warmth or erythema to the left lower extremity. She currently keeps the areas covered. Of note she had an Achilles tendon rupture on her right leg and is currently in a soft cast. 9/13; patient presents for follow-up. She missed her last clinic appointment. She has been using Santyl to the wound beds daily. She has no issues or complaints today. She reports the Santyl is helping clean up the wounds. She currently denies signs of infection. 9/22; patient states that 1 week ago she was seen by dermatology and they placed an Unna boot with Bactroban on her left lower extremity. Today she presents with a Unna boot in place. She currently denies signs of infection. 10/7; patient presents for follow-up. She reports improvement. She has been using Santyl to the areas of  nonviable tissue and collagen to granulation tissue. She  has been approved for infusions for her lupus. She is going to start these soon. These will be injection she can do at home. She currently denies signs of infection. 10/21; patient presents for follow-up. She reports continued improvement to her wound healing. She has been using Santyl to all areas except for the superior posterior left leg wound and she is using collagen to this. She has started her injection therapy for lupus. She started 1 week ago. She currently denies signs of infection. 11/3; patient presents for follow-up. She has no issues or complaints today. She is on her third week of injection therapy for lupus and tolerating this well. She has no issues or complaints today. She denies signs of infection. 11/17; patient presents for 2-week follow-up. She reports improvement in wound healing. She denies signs of infection. 12/1; patient presents for follow-up. She has been using collagen to the anterior left lower extremity wounds and Santyl to the left lateral malleolus. She has no issues or complaints today. 12/15; patient presents for follow-up. She is using collagen to the 2 anterior left leg wounds and Santyl to the left lateral malleolus wound. She reports improvement in wound healing. She denies signs of infection. 12/22; patient presents because she is having purulent drainage from her anterior left leg wound. She reports no inciting event and states that the area started draining spontaneously. She was hoping that the drainage would stop however this has not improved. She states this started 5 days ago. She reports tenderness to the wound bed. 12/29; patient presents for follow-up. She was able to take Augmentin for 4 days but did not tolerate the GI side effects. She continues to take doxycycline. She reports improvement in drainage. She no longer has increased warmth to the surrounding wound bed. She ran out of [place] and  has been using Vaseline to the wound beds. 1/6; Patient presents for follow-up. She has been taking Keflex without issues. She reports improvement in drainage and wound healing T the left lower o extremity. Unfortunately she developed another wound spontaneously to the right anterior lower extremity. She denies systemic signs of infection. 1/16; patient presents for follow-up. Patient completed her first prescription of Keflex and has not started the second. She reports no more drainage to the wound beds. She states there was an issue with insurance however she is going to be able to obtain Santyl soon. She has been using Dakin's wet-to-dry dressings to the right anterior leg and left lateral ankle. She has been using collagen to the distal anterior left leg wound and reports that the proximal anterior left leg wound has healed. She denies systemic signs of infection. 1/31; patient presents for follow-up. She reports improvement to the left leg wounds. She reports increased inflammation to the right leg wound. She thinks she is having a reaction to the foam border dressing on the right leg. She is unable to obtain Santyl. She has been using Dakin's wet-to-dry dressings to the right anterior leg and left lateral ankle. She has been using collagen to the distal anterior left leg wound. 2/13; patient presents for follow-up. She reports improvement in her wound healing. One of the wounds to the anterior aspect of the left lower leg has slightly reopened. She denies signs of infection. 2/27; patient presents for follow-up. She has no issues or complaints today. She denies signs of infection. 3/20; patient presents for follow-up. She has been using Medihoney to the right lower extremity wound and left lateral ankle wound. She continues  to use collagen to the left anterior leg wound. She has no issues or complaints today. She denies signs of infection. She reports improvement in wound 4/10; patient has  been using Medihoney to the right anterior lower leg wound and silver collagen on the left including the left ankle and left anterior. Fortunately the left anterior lower leg wound is healed. Both of the areas on the left ankle and the right anterior lower leg appear clean. These wounds are in the setting of chronic lupus. She has had multitude of chronic wounds and a rash on her arms. She tells me she has had both her arms and legs biopsied by dermatology at [facility] and also follows with [name] at [facility] although she is not had an appointment this year. She does not have a history of blood clots either arterial or venous. 4/24; patient presents for follow-up. She has been using collagen to the open wound beds. She has developed new areas to her right lower leg. She currently denies signs of infection. 5/9; patient presents for follow-up. She states she is seeing her rheumatologist next week. She has been using Medihoney to all the wound beds except for the lateral left malleolus. She has been using collegen here. She reports improvement to some areas and other areas are flaring due to her lupus. She currently denies signs of infection. 5/23; Patient saw her rheumatologist last week. She has no active lupus activity at this time. Other options for treatment were given. She is going to continue with her current therapy of weekly injections. She reports improvement to Her wound healing on the left ankle. She has been putting Medihoney here. On the right lower extremity she has had a flareup and has 2 open ulcers. She currently denies signs of infection including increased warmth, erythema or purulent drainage. She has been using Medihoney here as well. 7/10; patient presents for follow-up. She was evaluated in the ED on 6/21 and prescribed Doxycycline For worsening pain to her right lower extremity with increased erythema. She subsequently developed a systemic reaction from the antibiotic and had A  diffuse rash. There was concern that her lupus is not well controlled. It was recommended that she start Belimumab. She states she is starting the infusions next week. She has using silver alginate to the wound beds. She currently denies systemic signs of infection. 7/24; patient presents for follow-up. She started her infusion on 7/18 and tolerated this well. She has been using Medihoney to the wound beds. The anterior right leg wound has increased in size. She is currently on Keflex prescribed by her primary care physician. She denies signs of systemic infection. 8/1; patient presents for follow-up. She has been using Medihoney and Dakin's wet-to-dry dressings to the wound beds. She has no issues or complaints today. She denies signs of infection. The posterior right leg wound has healed. 8/14; patient presents for follow-up. She has been using Medihoney to the right lateral leg wound and Dakin's wet-to-dry to the right medial leg wound. She reports improvement in her chronic pain. She is scheduled to have her second infusion this week. She denies signs of infection. 8/28; patient presents for follow-up. She has been using Medihoney to the right lateral leg wound and Dakin's wet-to-dry to the right medial leg wound. 2 weeks ago she developed increased swelling to her foot and drainage to the wound beds. She was started on Keflex and has been taking this for almost 10 days. Despite this her symptoms have not improved.  She has developed increased redness and warmth to the posterior leg over the past several days. She has serous drainage on exam from the wound sites on palpation. She last had her last belimumab infusion on the 18th. 9/11; patient presents for follow-up. She went to the ED after our last clinic visit. She was admitted and given IV antibiotics for concern for of abscess and cellulitis. She was discharged with Augmentin. She reports improvement in her symptoms. She has been using Medihoney  and Dakin's wet-to-dry dressings. 9/25; patient presents for follow-up. She reports improvement in her wound healing however she is having drainage from the lateral aspect of her leg. There is a pinpoint opening here. She denies increased warmth, erythema or purulent drainage. She is not systemically unwell. She is no longer taking Augmentin. 10/2; patient presents for follow-up. She completed her course of Augmentin. She reports improvement in her wound healing and denies drainage. She has been using Medihoney to the remaining wound. She has no issues or complaints today. Electronic Signature(s) Signed: 05/28/2022 4:32:49 PM By: Geralyn CorwinHoffman, Eshika Reckart DO Entered By: Geralyn CorwinHoffman, Makinna Andy on 05/28/2022 16:04:18 -------------------------------------------------------------------------------- Physical Exam Details Patient Name: Date of Service: Kelly [name], Kelly Kelly H M. 05/28/2022 3:15 PM Medical Record Number: [id number] Patient Account Number: [id number] Date of Birth/Sex: Treating RN: 1996/06/17 (26 y.o. Kelly RowanF) Breedlove, Kelly [name] Primary Care Provider: Dione BoozePartridge, James Other Clinician: Referring Provider: Treating Provider/Extender: Kelly AfricaHoffman, Demonta Wombles Partridge, James Weeks in Treatment: 67 Constitutional respirations regular, non-labored and within target range for patient.. Cardiovascular 2+ dorsalis pedis/posterior tibialis pulses. Psychiatric pleasant and cooperative. Notes Right lower extremity: T the anterior aspect there is a very small open wound with mostly epithelization. No drainage noted. No drainage to the periwound. o Electronic Signature(s) Signed: 05/28/2022 4:32:49 PM By: Geralyn CorwinHoffman, Marelin Tat DO Entered By: Geralyn CorwinHoffman, Lynk Marti on 05/28/2022 16:11:34 -------------------------------------------------------------------------------- Physician Orders Details Patient Name: Date of Service: Kelly MillingNEWKIRK, Kelly Kelly Cherly AndersonH M. 05/28/2022 3:15 PM Medical Record Number: [id number] Patient Account Number:  [id number] Date of Birth/Sex: Treating RN: 1996/06/17 (26 y.o. Kelly RowanF) Breedlove, Kelly [name] Primary Care Provider: Dione BoozePartridge, James Other Clinician: Referring Provider: Treating Provider/Extender: Kelly AfricaHoffman, Diarra Ceja Partridge, James Weeks in Treatment: 867 Verbal / Phone Orders: No Diagnosis Coding Follow-up Appointments ppointment in 2 weeks. - w/ Dr. Mikey BussingHoffman and Merla RichesLauran Rm # [phone] Return A Anesthetic (In clinic) Topical Lidocaine 5% applied to wound bed Bathing/ Shower/ Hygiene May shower with protection but do not get wound dressing(s) wet. Edema Control - Lymphedema / SCD / Other Elevate legs to the level of the heart or above for 30 minutes daily and/or when sitting, a frequency of: Avoid standing for long periods of time. Wound Treatment Wound #12 - Lower Leg Wound Laterality: Right, Anterior Cleanser: Soap and Water 1 x Per Day/15 Days Discharge Instructions: May shower and wash wound with dial antibacterial soap and water prior to dressing change. Cleanser: Wound Cleanser 1 x Per Day/15 Days Discharge Instructions: Cleanse the wound with wound cleanser prior to applying a clean dressing using gauze sponges, not tissue or cotton balls. Prim Dressing: MediHoney Gel, tube 1.5 (oz) 1 x Per Day/15 Days ary Discharge Instructions: Apply to wound bed as instructed Secondary Dressing: Woven Gauze Sponge, Non-Sterile 4x4 in 1 x Per Day/15 Days Discharge Instructions: Apply over primary dressing as directed. Secured With: [employer] Roll Sterile, 4.5x3.1 (in/yd) 1 x Per Day/15 Days Discharge Instructions: Secure with Kerlix as directed. Secured With: Stretch Net Size 5, 10 (yds) 1 x Per Day/15 Days Electronic Signature(s) Signed: 05/28/2022 4:32:49 PM By: Geralyn CorwinHoffman, Jakota Manthei DO  Entered By: Geralyn Corwin on 05/28/2022 16:11:40 -------------------------------------------------------------------------------- Problem List Details Patient Name: Date of Service: Kelly [name], BOSQUE. 05/28/2022 3:15  PM Medical Record Number: [id number] Patient Account Number: [id number] Date of Birth/Sex: Treating RN: [birth year] (26 y.o. Kelly [name], Kelly [name] Primary Care Provider: Dione [name] Other Clinician: Referring Provider: Treating Provider/Extender: Kelly [name] in Treatment: 67 Active Problems ICD-10 Encounter Code Description Active Date MDM Diagnosis [phone] Non-pressure chronic ulcer of other part of left lower leg with fat layer exposed11/10/2020 No Yes L97.812 Non-pressure chronic ulcer of other part of right lower leg with fat layer 06/29/2021 No Yes exposed M32.9 Systemic lupus erythematosus, unspecified 02/14/2021 No Yes Inactive Problems ICD-10 Code Description Active Date Inactive Date L97.819 Non-pressure chronic ulcer of other part of right lower leg with unspecified severity 02/13/2021 02/13/2021 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 with unspecified severity 02/13/2021 02/13/2021 M01.027O Unspecified open wound, left lower leg, initial encounter 04/21/2021 04/21/2021 S91.002A Unspecified open wound, left ankle, initial encounter 04/21/2021 04/21/2021 Resolved Problems ICD-10 Code Description Active Date Resolved Date L03.116 Cellulitis of left lower limb 08/24/2021 08/24/2021 Electronic Signature(s) Signed: 05/28/2022 4:32:49 PM By: Geralyn Corwin DO Entered By: Geralyn Corwin on 05/28/2022 16:03:39 -------------------------------------------------------------------------------- Progress Note Details Patient Name: Date of Service: Kelly [name] Kelly Cherly Anderson. 05/28/2022 3:15 PM Medical Record Number: [id number] Patient Account Number: [id number] Date of Birth/Sex: Treating RN: 03/10/1996 (26 y.o. Kelly [name], Kelly [name] Primary Care Provider: Dione [name] Other Clinician: Referring Provider: Treating Provider/Extender: Kelly [name] in Treatment: 67 Subjective Chief Complaint Information  obtained from Patient Bilateral lower extremity wounds 04/21/21: left lower extremity wound s/p cellulitis 09/01/21; right lower extremity wound History of Present Illness (HPI) Admission 6/21 [name] with a past medical history of systemic lupus erythematosus that presents with bilateral lower extremity wounds that started in April 2022. She has had skin issues in the past where she developed very small wounds but these healed with time. She has never had open wounds like she does on her legs. She currently reports minimal drainage to the wounds. She does have pain to these areas but has been overall stable. She denies signs of infection. She has visited the ED for this issue and was recently prescribed doxycycline for possible cellulitis. She follows with Kelly [name] for her SLE and is currently on Plaquenil, prednisone and CellCept. 6/27; patient presents for 1 week follow-up. She states she saw her rheumatologist on 6/22 and prednisone was increased from 10 mg to 20 mg daily. She is currently at the highest dose of Plaquenil and CellCept and is going to try an infusion later today at her regimen. She reports improvement to the wounds. She has been using collagen with dressing changes. She currently denies signs of infection. Readmission 8/26 Patient presents to clinic today for open wounds to her left lower extremity. Her previous wounds that were treated have closed. She states that on 8/9 she developed cellulitis and was treated with 2 rounds of antibiotics. Her cellulitis has resolved. She had blisters that opened and are now large wounds. She has seen her dermatologist and rheumatologist since she has systemic lupus and a hard time healing. No changes have been made to her medications. She was recommended antibiotic ointment by her dermatologist to her wounds She picked up the antibiotic ointment today.. She reports pain to these areas. She  denies purulent drainage, increased warmth or erythema to the left lower extremity. She  currently keeps the areas covered. Of note she had an Achilles tendon rupture on her right leg and is currently in a soft cast. 9/13; patient presents for follow-up. She missed her last clinic appointment. She has been using Santyl to the wound beds daily. She has no issues or complaints today. She reports the Santyl is helping clean up the wounds. She currently denies signs of infection. 9/22; patient states that 1 week ago she was seen by dermatology and they placed an Unna boot with Bactroban on her left lower extremity. Today she presents with a Unna boot in place. She currently denies signs of infection. 10/7; patient presents for follow-up. She reports improvement. She has been using Santyl to the areas of nonviable tissue and collagen to granulation tissue. She has been approved for infusions for her lupus. She is going to start these soon. These will be injection she can do at home. She currently denies signs of infection. 10/21; patient presents for follow-up. She reports continued improvement to her wound healing. She has been using Santyl to all areas except for the superior posterior left leg wound and she is using collagen to this. She has started her injection therapy for lupus. She started 1 week ago. She currently denies signs of infection. 11/3; patient presents for follow-up. She has no issues or complaints today. She is on her third week of injection therapy for lupus and tolerating this well. She has no issues or complaints today. She denies signs of infection. 11/17; patient presents for 2-week follow-up. She reports improvement in wound healing. She denies signs of infection. 12/1; patient presents for follow-up. She has been using collagen to the anterior left lower extremity wounds and Santyl to the left lateral malleolus. She has no issues or complaints today. 12/15; patient presents for  follow-up. She is using collagen to the 2 anterior left leg wounds and Santyl to the left lateral malleolus wound. She reports improvement in wound healing. She denies signs of infection. 12/22; patient presents because she is having purulent drainage from her anterior left leg wound. She reports no inciting event and states that the area started draining spontaneously. She was hoping that the drainage would stop however this has not improved. She states this started 5 days ago. She reports tenderness to the wound bed. 12/29; patient presents for follow-up. She was able to take Augmentin for 4 days but did not tolerate the GI side effects. She continues to take doxycycline. She reports improvement in drainage. She no longer has increased warmth to the surrounding wound bed. She ran out of [place] and has been using Vaseline to the wound beds. 1/6; Patient presents for follow-up. She has been taking Keflex without issues. She reports improvement in drainage and wound healing T the left lower o extremity. Unfortunately she developed another wound spontaneously to the right anterior lower extremity. She denies systemic signs of infection. 1/16; patient presents for follow-up. Patient completed her first prescription of Keflex and has not started the second. She reports no more drainage to the wound beds. She states there was an issue with insurance however she is going to be able to obtain Santyl soon. She has been using Dakin's wet-to-dry dressings to the right anterior leg and left lateral ankle. She has been using collagen to the distal anterior left leg wound and reports that the proximal anterior left leg wound has healed. She denies systemic signs of infection. 1/31; patient presents for follow-up. She reports improvement to the left leg  wounds. She reports increased inflammation to the right leg wound. She thinks she is having a reaction to the foam border dressing on the right leg. She is unable  to obtain Santyl. She has been using Dakin's wet-to-dry dressings to the right anterior leg and left lateral ankle. She has been using collagen to the distal anterior left leg wound. 2/13; patient presents for follow-up. She reports improvement in her wound healing. One of the wounds to the anterior aspect of the left lower leg has slightly reopened. She denies signs of infection. 2/27; patient presents for follow-up. She has no issues or complaints today. She denies signs of infection. 3/20; patient presents for follow-up. She has been using Medihoney to the right lower extremity wound and left lateral ankle wound. She continues to use collagen to the left anterior leg wound. She has no issues or complaints today. She denies signs of infection. She reports improvement in wound 4/10; patient has been using Medihoney to the right anterior lower leg wound and silver collagen on the left including the left ankle and left anterior. Fortunately the left anterior lower leg wound is healed. Both of the areas on the left ankle and the right anterior lower leg appear clean. These wounds are in the setting of chronic lupus. She has had multitude of chronic wounds and a rash on her arms. She tells me she has had both her arms and legs biopsied by dermatology at [facility] and also follows with [name] at [facility] although she is not had an appointment this year. She does not have a history of blood clots either arterial or venous. 4/24; patient presents for follow-up. She has been using collagen to the open wound beds. She has developed new areas to her right lower leg. She currently denies signs of infection. 5/9; patient presents for follow-up. She states she is seeing her rheumatologist next week. She has been using Medihoney to all the wound beds except for the lateral left malleolus. She has been using collegen here. She reports improvement to some areas and other areas are flaring due to her lupus. She  currently denies signs of infection. 5/23; Patient saw her rheumatologist last week. She has no active lupus activity at this time. Other options for treatment were given. She is going to continue with her current therapy of weekly injections. She reports improvement to Her wound healing on the left ankle. She has been putting Medihoney here. On the right lower extremity she has had a flareup and has 2 open ulcers. She currently denies signs of infection including increased warmth, erythema or purulent drainage. She has been using Medihoney here as well. 7/10; patient presents for follow-up. She was evaluated in the ED on 6/21 and prescribed Doxycycline For worsening pain to her right lower extremity with increased erythema. She subsequently developed a systemic reaction from the antibiotic and had A diffuse rash. There was concern that her lupus is not well controlled. It was recommended that she start Belimumab. She states she is starting the infusions next week. She has using silver alginate to the wound beds. She currently denies systemic signs of infection. 7/24; patient presents for follow-up. She started her infusion on 7/18 and tolerated this well. She has been using Medihoney to the wound beds. The anterior right leg wound has increased in size. She is currently on Keflex prescribed by her primary care physician. She denies signs of systemic infection. 8/1; patient presents for follow-up. She has been using Medihoney and Dakin's wet-to-dry  dressings to the wound beds. She has no issues or complaints today. She denies signs of infection. The posterior right leg wound has healed. 8/14; patient presents for follow-up. She has been using Medihoney to the right lateral leg wound and Dakin's wet-to-dry to the right medial leg wound. She reports improvement in her chronic pain. She is scheduled to have her second infusion this week. She denies signs of infection. 8/28; patient presents for  follow-up. She has been using Medihoney to the right lateral leg wound and Dakin's wet-to-dry to the right medial leg wound. 2 weeks ago she developed increased swelling to her foot and drainage to the wound beds. She was started on Keflex and has been taking this for almost 10 days. Despite this her symptoms have not improved. She has developed increased redness and warmth to the posterior leg over the past several days. She has serous drainage on exam from the wound sites on palpation. She last had her last belimumab infusion on the 18th. 9/11; patient presents for follow-up. She went to the ED after our last clinic visit. She was admitted and given IV antibiotics for concern for of abscess and cellulitis. She was discharged with Augmentin. She reports improvement in her symptoms. She has been using Medihoney and Dakin's wet-to-dry dressings. 9/25; patient presents for follow-up. She reports improvement in her wound healing however she is having drainage from the lateral aspect of her leg. There is a pinpoint opening here. She denies increased warmth, erythema or purulent drainage. She is not systemically unwell. She is no longer taking Augmentin. 10/2; patient presents for follow-up. She completed her course of Augmentin. She reports improvement in her wound healing and denies drainage. She has been using Medihoney to the remaining wound. She has no issues or complaints today. Patient History Information obtained from Patient. Family History Diabetes - Paternal Grandparents, Hypertension - Paternal Grandparents, Thyroid Problems - Maternal Grandparents,Paternal Grandparents, No family history of Cancer, Heart Disease, Hereditary Spherocytosis, Kidney Disease, Lung Disease, Seizures, Stroke, Tuberculosis. Social History Never smoker, Marital Status - Married, Alcohol Use - Moderate, Drug Use - Current History - Marijuana, Caffeine Use - Daily - Coffee. Medical History Respiratory Patient has  history of Asthma Immunological Patient has history of Lupus Erythematosus Hospitalization/Surgery History - abscess and cellulitis right lower leg 04/23/2022. Objective Constitutional respirations regular, non-labored and within target range for patient.. Vitals Time Taken: 3:26 PM, Height: 62 in, Temperature: 98.4 F, Pulse: 90 bpm, Respiratory Rate: 18 breaths/min, Blood Pressure: 116/84 mmHg. Cardiovascular 2+ dorsalis pedis/posterior tibialis pulses. Psychiatric pleasant and cooperative. General Notes: Right lower extremity: T the anterior aspect there is a very small open wound with mostly epithelization. No drainage noted. No drainage to the o periwound. Integumentary (Hair, Skin) Wound #10 status is Open. Original cause of wound was Gradually Appeared. The date acquired was: 12/18/2021. The wound has been in treatment 23 weeks. The wound is located on the Right,Medial Lower Leg. The wound measures 0cm length x 0cm width x 0cm depth; 0cm^2 area and 0cm^3 volume. There is Fat Layer (Subcutaneous Tissue) exposed. There is no tunneling or undermining noted. There is a medium amount of serosanguineous drainage noted. The wound margin is well defined and not attached to the wound base. There is large (67-100%) red, pink granulation within the wound bed. There is no necrotic tissue within the wound bed. The periwound skin appearance had no abnormalities noted for texture. The periwound skin appearance had no abnormalities noted for moisture. The periwound skin appearance  had no abnormalities noted for color. Periwound temperature was noted as No Abnormality. Wound #12 status is Open. Original cause of wound was Gradually Appeared. The date acquired was: 03/19/2022. The wound has been in treatment 10 weeks. The wound is located on the Right,Anterior Lower Leg. The wound measures 0.3cm length x 0.3cm width x 0.2cm depth; 0.071cm^2 area and 0.014cm^3 volume. There is Fat Layer (Subcutaneous  Tissue) exposed. There is no tunneling or undermining noted. There is a medium amount of serosanguineous drainage noted. The wound margin is epibole. There is small (1-33%) red granulation within the wound bed. There is a large (67-100%) amount of necrotic tissue within the wound bed including Adherent Slough. The periwound skin appearance had no abnormalities noted for texture. The periwound skin appearance had no abnormalities noted for moisture. The periwound skin appearance had no abnormalities noted for color. Periwound temperature was noted as No Abnormality. Assessment Active Problems ICD-10 Non-pressure chronic ulcer of other part of left lower leg with fat layer exposed Non-pressure chronic ulcer of other part of right lower leg with fat layer exposed Systemic lupus erythematosus, unspecified Patient has healed the right medial wound. She has 1 remaining wound to the anterior lateral aspect of the right leg. This appears well-healing. She no longer has drainage. No signs of infection. I recommended continuing Medihoney. Follow-up in 2 weeks and I am hopeful the wound will be healed by then. Plan Follow-up Appointments: Return Appointment in 2 weeks. - w/ Dr. Mikey Bussing and Maryruth Bun Rm # 9 Anesthetic: (In clinic) Topical Lidocaine 5% applied to wound bed Bathing/ Shower/ Hygiene: May shower with protection but do not get wound dressing(s) wet. Edema Control - Lymphedema / SCD / Other: Elevate legs to the level of the heart or above for 30 minutes daily and/or when sitting, a frequency of: Avoid standing for long periods of time. WOUND #12: - Lower Leg Wound Laterality: Right, Anterior Cleanser: Soap and Water 1 x Per Day/15 Days Discharge Instructions: May shower and wash wound with dial antibacterial soap and water prior to dressing change. Cleanser: Wound Cleanser 1 x Per Day/15 Days Discharge Instructions: Cleanse the wound with wound cleanser prior to applying a clean dressing using  gauze sponges, not tissue or cotton balls. Prim Dressing: MediHoney Gel, tube 1.5 (oz) 1 x Per Day/15 Days ary Discharge Instructions: Apply to wound bed as instructed Secondary Dressing: Woven Gauze Sponge, Non-Sterile 4x4 in 1 x Per Day/15 Days Discharge Instructions: Apply over primary dressing as directed. Secured With: [employer], 4.5x3.1 (in/yd) 1 x Per Day/15 Days Discharge Instructions: Secure with Kerlix as directed. Secured With: Stretch Net Size 5, 10 (yds) 1 x Per Day/15 Days 1. Medihoney 2. Follow-up in 1 week Electronic Signature(s) Signed: 05/28/2022 4:32:49 PM By: Geralyn Corwin DO Entered By: Geralyn Corwin on 05/28/2022 16:15:01 -------------------------------------------------------------------------------- HxROS Details Patient Name: Date of Service: Kelly [name] Kelly H M. 05/28/2022 3:15 PM Medical Record Number: [id number] Patient Account Number: [id number] Date of Birth/Sex: Treating RN: [birth year] (26 y.o. Kelly [name], Kelly [name] Primary Care Provider: Dione [name] Other Clinician: Referring Provider: Treating Provider/Extender: Kelly [name] in Treatment: 67 Information Obtained From Patient Respiratory Medical History: Positive for: Asthma Immunological Medical History: Positive for: Lupus Erythematosus Immunizations Pneumococcal Vaccine: Received Pneumococcal Vaccination: No Implantable Devices None Hospitalization / Surgery History Type of Hospitalization/Surgery abscess and cellulitis right lower leg 04/23/2022 Family and Social History Cancer: No; Diabetes: Yes - Paternal Grandparents; Heart Disease: No; Hereditary Spherocytosis: No; Hypertension: Yes - Paternal Grandparents;  Kidney Disease: No; Lung Disease: No; Seizures: No; Stroke: No; Thyroid Problems: Yes - Maternal Grandparents,Paternal Grandparents; Tuberculosis: No; Never smoker; Marital Status - Married; Alcohol Use: Moderate; Drug Use: Current  History - Marijuana; Caffeine Use: Daily - Coffee; Financial Concerns: No; Food, Clothing or Shelter Needs: No; Support System Lacking: No Electronic Signature(s) Signed: 05/28/2022 4:12:00 PM By: Fonnie Mu RN Signed: 05/28/2022 4:32:49 PM By: Geralyn Corwin DO Entered By: Geralyn Corwin on 05/28/2022 16:04:23 -------------------------------------------------------------------------------- SuperBill Details Patient Name: Date of Service: Kelly [name] Kelly H M. 05/28/2022 Medical Record Number: [id number] Patient Account Number: [id number] Date of Birth/Sex: Treating RN: 05/29/1996 (26 y.o. Kelly [name], Kelly [name] Primary Care Provider: Dione [name] Other Clinician: Referring Provider: Treating Provider/Extender: Kelly [name] in Treatment: 66 Diagnosis Coding ICD-10 Codes Code Description [phone] Non-pressure chronic ulcer of other part of left lower leg with fat layer exposed L97.812 Non-pressure chronic ulcer of other part of right lower leg with fat layer exposed M32.9 Systemic lupus erythematosus, unspecified Facility Procedures CPT4 Code: [id number] Description: 99213 - WOUND CARE VISIT-LEV 3 EST PT Modifier: Quantity: 1 Physician Procedures : CPT4 Code Description Modifier [id number] - WC PHYS LEVEL 3 - EST PT ICD-10 Diagnosis Description L97.822 Non-pressure chronic ulcer of other part of left lower leg with fat layer exposed L97.812 Non-pressure chronic ulcer of other part of right  lower leg with fat layer exposed M32.9 Systemic lupus erythematosus, unspecified Quantity: 1 Electronic Signature(s) Signed: 05/28/2022 4:32:49 PM By: Geralyn Corwin DO Previous Signature: 05/28/2022 4:12:00 PM Version By: Fonnie Mu RN Entered By: Geralyn Corwin on 05/28/2022 16:15:29

## 2022-05-28 NOTE — Progress Notes (Signed)
Kelly [name], Kelly [name] ([id number]) Visit Report for 05/28/2022 Arrival Information Details Patient Name: Date of Service: Kelly [name], Kelly [name] 05/28/2022 3:15 PM Medical Record Number: [id number] Patient Account Number: [id number] Date of Birth/Sex: Treating RN: [birth year] (26 y.o. [name], [name] Primary Care Ozan Maclay: [name] Other Clinician: Referring Gianne Shugars: Treating Hatem Cull/Extender: Elsworth Soho in Treatment: 24 Visit Information History Since Last Visit Added or deleted any medications: No Patient Arrived: Ambulatory Any new allergies or adverse reactions: No Arrival Time: 15:24 Had a fall or experienced change in No Accompanied By: self activities of daily living that may affect Transfer Assistance: None risk of falls: Patient Identification Verified: Yes Signs or symptoms of abuse/neglect since last visito No Secondary Verification Process Completed: Yes Hospitalized since last visit: No Patient Requires Transmission-Based Precautions: No Implantable device outside of the clinic excluding No Patient Has Alerts: Yes cellular tissue based products placed in the center Patient Alerts: Left ABi=1.18 since last visit: Has Dressing in Place as Prescribed: Yes Pain Present Now: No Electronic Signature(s) Signed: 05/28/2022 4:49:57 PM By: Erenest Blank Entered By: Erenest Blank on 05/28/2022 15:26:33 -------------------------------------------------------------------------------- Clinic Level of Care Assessment Details Patient Name: Date of Service: [name], [name] 05/28/2022 3:15 PM Medical Record Number: [id number] Patient Account Number: [id number] Date of Birth/Sex: Treating RN: May 18, 1996 (26 y.o. [name], [name] Primary Care Denisha Hoel: [name] Other Clinician: Referring Rhyan Wolters: Treating Abygail Galeno/Extender: Elsworth Soho in Treatment: 67 Clinic Level of Care Assessment  Items TOOL 4 Quantity Score X- 1 0 Use when only an EandM is performed on FOLLOW-UP visit ASSESSMENTS - Nursing Assessment / Reassessment X- 1 10 Reassessment of Co-morbidities (includes updates in patient status) X- 1 5 Reassessment of Adherence to Treatment Plan ASSESSMENTS - Wound and Skin A ssessment / Reassessment X - Simple Wound Assessment / Reassessment - one wound 1 5 _0  - 0 Complex Wound Assessment / Reassessment - multiple wounds _1  - 0 Dermatologic / Skin Assessment (not related to wound area) ASSESSMENTS - Focused Assessment X- 1 5 Circumferential Edema Measurements - multi extremities _2  - 0 Nutritional Assessment / Counseling / Intervention _3  - 0 Lower Extremity Assessment (monofilament, tuning fork, pulses) _4  - 0 Peripheral Arterial Disease Assessment (using hand held doppler) ASSESSMENTS - Ostomy and/or Continence Assessment and Care _5  - 0 Incontinence Assessment and Management _6  - 0 Ostomy Care Assessment and Management (repouching, etc.) PROCESS - Coordination of Care X - Simple Patient / Family Education for ongoing care 1 15 _7  - 0 Complex (extensive) Patient / Family Education for ongoing care X- 1 10 Staff obtains Programmer, systems, Records, T Results / Process Orders est _8  - 0 Staff telephones HHA, Nursing Homes / Clarify orders / etc _9  - 0 Routine Transfer to another Facility (non-emergent condition) _10  - 0 Routine Hospital Admission (non-emergent condition) _11  - 0 New Admissions / Biomedical engineer / Ordering NPWT Apligraf, etc. , _12  - 0 Emergency Hospital Admission (emergent condition) X- 1 10 Simple Discharge Coordination _13  - 0 Complex (extensive) Discharge Coordination PROCESS - Special Needs _14  - 0 Pediatric / Minor Patient Management _15  - 0 Isolation Patient Management _16  - 0 Hearing / Language / Visual special needs _17  - 0 Assessment of Community assistance (transportation, D/C planning, etc.) _18  - 0 Additional  assistance / Altered mentation _19  - 0 Support Surface(s) Assessment (bed, cushion, seat, etc.) INTERVENTIONS - Wound Cleansing / Measurement X - Simple Wound Cleansing - one wound 1 5 _20  - 0 Complex Wound  Cleansing - multiple wounds X- 1 5 Wound Imaging (photographs - any number of wounds) _0  - 0 Wound Tracing (instead of photographs) X- 1 5 Simple Wound Measurement - one wound _1  - 0 Complex Wound Measurement - multiple wounds INTERVENTIONS - Wound Dressings X - Small Wound Dressing one or multiple wounds 1 10 _2  - 0 Medium Wound Dressing one or multiple wounds _3  - 0 Large Wound Dressing one or multiple wounds X- 1 5 Application of Medications - topical <GLOVFIEPPIRJJOAC>_1<\/YSAYTKZSWFUXNATF>_5  - 0 Application of Medications - injection INTERVENTIONS - Miscellaneous _5  - 0 External ear exam _6  - 0 Specimen Collection (cultures, biopsies, blood, body fluids, etc.) _7  - 0 Specimen(s) / Culture(s) sent or taken to Lab for analysis _8  - 0 Patient Transfer (multiple staff / Civil Service fast streamer / Similar devices) _9  - 0 Simple Staple / Suture removal (25 or less) _10  - 0 Complex Staple / Suture removal (26 or more) _11  - 0 Hypo / Hyperglycemic Management (close monitor of Blood Glucose) _12  - 0 Ankle / Brachial Index (ABI) - do not check if billed separately X- 1 5 Vital Signs Has the patient been seen at the hospital within the last three years: Yes Total Score: 95 Level Of Care: New/Established - Level 3 Electronic Signature(s) Signed: 05/28/2022 4:12:00 PM By: Rhae Hammock RN Entered By: Rhae Hammock on 05/28/2022 15:59:10 -------------------------------------------------------------------------------- Lower Extremity Assessment Details Patient Name: Date of Service: Earlie Lou RIA Cipriano Bunker. 05/28/2022 3:15 PM Medical Record Number: [id number] Patient Account Number: [id number] Date of Birth/Sex: Treating RN: 1996-06-21 (26 y.o. [name], [name] Primary Care Idora Brosious: [name] Other  Clinician: Referring Carleena Mires: Treating Aniruddh Ciavarella/Extender: Elsworth Soho in Treatment: 67 Edema Assessment Assessed: Shirlyn Goltz: No] Patrice Paradise: No] Edema: [Left: Yes] [Right: Yes] Calf Left: Right: Point of Measurement: 27 cm From Medial Instep 33 cm Ankle Left: Right: Point of Measurement: 8 cm From Medial Instep 22.6 cm Vascular Assessment Pulses: Dorsalis Pedis Palpable: [Right:Yes] Electronic Signature(s) Signed: 05/28/2022 4:12:00 PM By: Rhae Hammock RN Signed: 05/28/2022 4:49:57 PM By: Erenest Blank Entered By: Erenest Blank on 05/28/2022 15:39:20 -------------------------------------------------------------------------------- Multi Wound Chart Details Patient Name: Date of Service: [name] 05/28/2022 3:15 PM Medical Record Number: [id number] Patient Account Number: [id number] Date of Birth/Sex: Treating RN: 1996-03-22 (26 y.o. [name], [name] Primary Care Stylianos Stradling: [name] Other Clinician: Referring Jaimarie Rapozo: Treating Lisel Siegrist/Extender: Elsworth Soho in Treatment: 67 Vital Signs Height(in): 62 Pulse(bpm): 90 Weight(lbs): Blood Pressure(mmHg): 116/84 Body Mass Index(BMI): Temperature(F): 98.4 Respiratory Rate(breaths/min): 18 Photos: [N/A:N/A] Right, Medial Lower Leg Right, Anterior Lower Leg N/A Wound Location: Gradually Appeared Gradually Appeared N/A Wounding Event: Lupus Auto-immune N/A Primary Etiology: Asthma, Lupus Erythematosus Asthma, Lupus Erythematosus N/A Comorbid History: 12/18/2021 03/19/2022 N/A Date Acquired: 23 10 N/A Weeks of Treatment: Open Open N/A Wound Status: No No N/A Wound Recurrence: Yes No N/A Clustered Wound: 1 N/A N/A Clustered Quantity: 0x0x0 0.3x0.3x0.2 N/A Measurements L x W x D (cm) 0 0.071 N/A A (cm) : rea 0 0.014 N/A Volume (cm) : 100.00% 87.40% N/A % Reduction in A rea: 100.00% 87.60% N/A % Reduction in Volume: Full Thickness  With Exposed Support Full Thickness Without Exposed N/A Classification: Structures Support Structures Medium Medium N/A Exudate Amount: Serosanguineous Serosanguineous N/A Exudate Type: red, brown red, brown N/A Exudate Color: Well defined, not attached Epibole N/A Wound Margin: Large (67-100%) Small (1-33%) N/A Granulation Amount: Red, Pink Red N/A Granulation Quality: None Present (0%) Large (67-100%) N/A Necrotic Amount: Fat Layer (Subcutaneous  Tissue): Yes Fat Layer (Subcutaneous Tissue): Yes N/A Exposed Structures: Fascia: No Tendon: No Muscle: No Joint: No Bone: No Small (1-33%) None N/A Epithelialization: No Abnormalities Noted No Abnormalities Noted N/A Periwound Skin Texture: No Abnormalities Noted No Abnormalities Noted N/A Periwound Skin Moisture: No Abnormalities Noted No Abnormalities Noted N/A Periwound Skin Color: No Abnormality No Abnormality N/A Temperature: Treatment Notes Electronic Signature(s) Signed: 05/28/2022 4:12:00 PM By: Rhae Hammock RN Signed: 05/28/2022 4:32:49 PM By: Kalman Shan DO Entered By: Kalman Shan on 05/28/2022 16:03:44 -------------------------------------------------------------------------------- Multi-Disciplinary Care Plan Details Patient Name: Date of Service: [name] 05/28/2022 3:15 PM Medical Record Number: [id number] Patient Account Number: [id number] Date of Birth/Sex: Treating RN: [birth year] (26 y.o. [name], [name] Primary Care Kumari Sculley: [name] Other Clinician: Referring Daud Cayer: Treating Vittoria Noreen/Extender: Elsworth Soho in Treatment: 67 Multidisciplinary Care Plan reviewed with physician Active Inactive Wound/Skin Impairment Nursing Diagnoses: Impaired tissue integrity Goals: Patient/caregiver will verbalize understanding of skin care regimen Date Initiated: 02/13/2021 Target Resolution Date: 06/22/2022 Goal Status: Active Ulcer/skin  breakdown will have a volume reduction of 30% by week 4 Date Initiated: 02/13/2021 Date Inactivated: 04/21/2021 Target Resolution Date: 03/13/2021 Unmet Reason: pt did not return to Goal Status: Unmet clinic for F/U Ulcer/skin breakdown will have a volume reduction of 80% by week 12 Date Initiated: 04/21/2021 Date Inactivated: 07/13/2021 Target Resolution Date: 07/27/2021 Goal Status: Met Interventions: Assess patient/caregiver ability to obtain necessary supplies Assess patient/caregiver ability to perform ulcer/skin care regimen upon admission and as needed Assess ulceration(s) every visit Provide education on ulcer and skin care Treatment Activities: Skin care regimen initiated : 02/13/2021 Topical wound management initiated : 02/13/2021 Notes: 06/29/21: Wounds 3 and 6 only at 80% volume reduction. Target date extended. Electronic Signature(s) Signed: 05/28/2022 4:12:00 PM By: Rhae Hammock RN Entered By: Rhae Hammock on 05/28/2022 15:43:28 -------------------------------------------------------------------------------- Pain Assessment Details Patient Name: Date of Service: Earlie Lou RIA Cipriano Bunker. 05/28/2022 3:15 PM Medical Record Number: [id number] Patient Account Number: [id number] Date of Birth/Sex: Treating RN: 1996/07/12 (26 y.o. [name], [name] Primary Care Kahlia Lagunes: [name] Other Clinician: Referring Laurann Mcmorris: Treating Aurianna Earlywine/Extender: Elsworth Soho in Treatment: 35 Active Problems Location of Pain Severity and Description of Pain Patient Has Paino No Site Locations Pain Management and Medication Current Pain Management: Electronic Signature(s) Signed: 05/28/2022 4:12:00 PM By: Rhae Hammock RN Signed: 05/28/2022 4:49:57 PM By: Erenest Blank Entered By: Erenest Blank on 05/28/2022 15:27:05 -------------------------------------------------------------------------------- Patient/Caregiver Education Details Patient  Name: Date of Service: Fransico Setters 10/2/2023andnbsp3:15 PM Medical Record Number: [id number] Patient Account Number: [id number] Date of Birth/Gender: Treating RN: Dec 10, 1995 (26 y.o. [name], [name] Primary Care Physician: [name] Other Clinician: Referring Physician: Treating Physician/Extender: Elsworth Soho in Treatment: 52 Education Assessment Education Provided To: Patient Education Topics Provided Wound/Skin Impairment: Methods: Explain/Verbal Responses: Reinforcements needed, State content correctly Electronic Signature(s) Signed: 05/28/2022 4:12:00 PM By: Rhae Hammock RN Entered By: Rhae Hammock on 05/28/2022 15:43:40 -------------------------------------------------------------------------------- Wound Assessment Details Patient Name: Date of Service: Fransico Setters. 05/28/2022 3:15 PM Medical Record Number: [id number] Patient Account Number: [id number] Date of Birth/Sex: Treating RN: 12/02/1995 (26 y.o. [name], [name] Primary Care Ambriella Kitt: [name] Other Clinician: Referring Fabrizio Filip: Treating Memorie Yokoyama/Extender: Elsworth Soho in Treatment: 67 Wound Status Wound Number: 10 Primary Etiology: Lupus Wound Location: Right, Medial Lower Leg Wound Status: Open Wounding Event: Gradually Appeared Comorbid History: Asthma, Lupus Erythematosus Date Acquired: 12/18/2021 Weeks Of Treatment: 23 Clustered Wound: Yes  Photos Wound Measurements Length: (cm) Width: (cm) Depth: (cm) Clustered Quantity: Area: (cm) Volume: (cm) 0 % Reduction in Area: 100% 0 % Reduction in Volume: 100% 0 Epithelialization: Small (1-33%) 1 Tunneling: No 0 Undermining: No 0 Wound Description Classification: Full Thickness With Exposed Support Structures Wound Margin: Well defined, not attached Exudate Amount: Medium Exudate Type: Serosanguineous Exudate Color: red, brown Foul  Odor After Cleansing: No Slough/Fibrino Yes Wound Bed Granulation Amount: Large (67-100%) Exposed Structure Granulation Quality: Red, Pink Fascia Exposed: No Necrotic Amount: None Present (0%) Fat Layer (Subcutaneous Tissue) Exposed: Yes Tendon Exposed: No Muscle Exposed: No Joint Exposed: No Bone Exposed: No Periwound Skin Texture Texture Color No Abnormalities Noted: Yes No Abnormalities Noted: Yes Moisture Temperature / Pain No Abnormalities Noted: Yes Temperature: No Abnormality Electronic Signature(s) Signed: 05/28/2022 4:12:00 PM By: Rhae Hammock RN Signed: 05/28/2022 4:49:57 PM By: Erenest Blank Entered By: Erenest Blank on 05/28/2022 15:41:31 -------------------------------------------------------------------------------- Wound Assessment Details Patient Name: Date of Service: Earlie Lou [facility]. 05/28/2022 3:15 PM Medical Record Number: [id number] Patient Account Number: [id number] Date of Birth/Sex: Treating RN: 1995/09/28 (26 y.o. [name], [name] Primary Care Antoninette Lerner: [name] Other Clinician: Referring Brinly Maietta: Treating Areal Cochrane/Extender: Elsworth Soho in Treatment: 67 Wound Status Wound Number: 12 Primary Etiology: Auto-immune Wound Location: Right, Anterior Lower Leg Wound Status: Open Wounding Event: Gradually Appeared Comorbid History: Asthma, Lupus Erythematosus Date Acquired: 03/19/2022 Weeks Of Treatment: 10 Clustered Wound: No Photos Wound Measurements Length: (cm) 0.3 Width: (cm) 0.3 Depth: (cm) 0.2 Area: (cm) 0.071 Volume: (cm) 0.014 % Reduction in Area: 87.4% % Reduction in Volume: 87.6% Epithelialization: None Tunneling: No Undermining: No Wound Description Classification: Full Thickness Without Exposed Support Structures Wound Margin: Epibole Exudate Amount: Medium Exudate Type: Serosanguineous Exudate Color: red, brown Foul Odor After Cleansing: No Slough/Fibrino Yes Wound  Bed Granulation Amount: Small (1-33%) Exposed Structure Granulation Quality: Red Fat Layer (Subcutaneous Tissue) Exposed: Yes Necrotic Amount: Large (67-100%) Necrotic Quality: Adherent Slough Periwound Skin Texture Texture Color No Abnormalities Noted: Yes No Abnormalities Noted: Yes Moisture Temperature / Pain No Abnormalities Noted: Yes Temperature: No Abnormality Electronic Signature(s) Signed: 05/28/2022 4:12:00 PM By: Rhae Hammock RN Signed: 05/28/2022 4:49:57 PM By: Erenest Blank Entered By: Erenest Blank on 05/28/2022 15:40:43 -------------------------------------------------------------------------------- Vitals Details Patient Name: Date of Service: [name] 05/28/2022 3:15 PM Medical Record Number: [id number] Patient Account Number: [id number] Date of Birth/Sex: Treating RN: [birth year] (26 y.o. [name], [name] Primary Care Vickey Boak: [name] Other Clinician: Referring Shadai Mcclane: Treating Darol Cush/Extender: Elsworth Soho in Treatment: 67 Vital Signs Time Taken: 15:26 Temperature (F): 98.4 Height (in): 62 Pulse (bpm): 90 Respiratory Rate (breaths/min): 18 Blood Pressure (mmHg): 116/84 Reference Range: 80 - 120 mg / dl Electronic Signature(s) Signed: 05/28/2022 4:49:57 PM By: Erenest Blank Signed: 05/28/2022 4:49:57 PM By: Erenest Blank Entered By: Erenest Blank on 05/28/2022 15:26:59

## 2022-06-01 ENCOUNTER — Ambulatory Visit (INDEPENDENT_AMBULATORY_CARE_PROVIDER_SITE_OTHER): Payer: Managed Care, Other (non HMO) | Admitting: Obstetrics and Gynecology

## 2022-06-01 ENCOUNTER — Encounter: Payer: Self-pay | Admitting: Obstetrics and Gynecology

## 2022-06-01 VITALS — BP 125/90 | HR 85 | Ht 62.0 in | Wt 150.5 lb

## 2022-06-01 DIAGNOSIS — Z23 Encounter for immunization: Secondary | ICD-10-CM | POA: Diagnosis not present

## 2022-06-01 DIAGNOSIS — N911 Secondary amenorrhea: Secondary | ICD-10-CM

## 2022-06-01 MED ORDER — MEDROXYPROGESTERONE ACETATE 10 MG PO TABS: 10.0000 mg | ORAL_TABLET | Freq: Every day | ORAL | 0 refills | Status: DC

## 2022-06-01 NOTE — Progress Notes (Signed)
   Subjective:    Patient ID: Kelly [name], female    DOB: 1995/09/05, 26 y.o.   MRN: [id number]  HPI Last depo provera administered in 06/2021, followed by 2 menses - one in May and June. She is interested in conceiving. Currently on hydrochrloroquine infusions for lupus. She was amenorrheic with depo provera. Prior to pregnancy had been on nexplanon without issues. Has been having occasional moliminals symptoms without menses and would like to conceive as soon as possible. No changes in urinary or bowel habits. No dyspareunia. Reports lupus is currently under control     Objective:  BP (!) 125/90   Pulse 85   Ht 5\' 2"  (1.575 m)   Wt 150 lb 8 oz (68.3 kg)   LMP 02/04/2022 (Approximate) Comment: irregular  BMI 27.53 kg/m   Physical Exam Vitals reviewed.  Constitutional:      Appearance: Normal appearance.  HENT:     Head: Normocephalic.  Pulmonary:     Effort: Pulmonary effort is normal.  Neurological:     General: No focal deficit present.     Mental Status: She is alert.  Psychiatric:        Mood and Affect: Mood normal.        Behavior: Behavior normal.        Thought Content: Thought content normal.        Judgment: Judgment normal.        Assessment & Plan:   1. Amenorrhea, secondary Likely secondary to depo provera injection. Reviewed can be up to 1 yr after last administration prior to return to fertility.  Provera challenge test ordered Secondary amenorrhea labs ordered  2. Encounter for immunization - Flu Vaccine QUAD 36+ mos IM (Fluarix, Quad PF)  Darliss Cheney, MD 06/01/22

## 2022-06-01 NOTE — Progress Notes (Signed)
Patient presents for having irregular periods since Feb 2023. Patient state that she had a cycle in May and June, but no cycle since.  Last pap: 08/23/2021 Normal

## 2022-06-07 LAB — HEMOGLOBIN A1C
Est. average glucose Bld gHb Est-mCnc: 105 mg/dL
Hgb A1c MFr Bld: 5.3 % (ref 4.8–5.6)

## 2022-06-07 LAB — TSH+PRL+TESTT+TESTF+17OHP
17-Hydroxyprogesterone: <10 ng/dL
Prolactin: 11.6 ng/mL (ref 4.8–23.3)
TSH: 1.32 u[IU]/mL (ref 0.450–4.500)
Testosterone, Free: <0.2 pg/mL (ref 0.0–4.2)
Testosterone, Total, LC/MS: 6.2 ng/dL — ABNORMAL LOW (ref 10.0–55.0)

## 2022-06-07 LAB — TESTOSTERONE,FREE AND TOTAL: Testosterone: <3 ng/dL — ABNORMAL LOW (ref 13–71)

## 2022-06-07 LAB — BETA HCG QUANT (REF LAB): hCG Quant: <1 m[IU]/mL

## 2022-06-11 ENCOUNTER — Encounter (HOSPITAL_BASED_OUTPATIENT_CLINIC_OR_DEPARTMENT_OTHER): Payer: Managed Care, Other (non HMO) | Admitting: Internal Medicine

## 2022-06-11 DIAGNOSIS — L97822 Non-pressure chronic ulcer of other part of left lower leg with fat layer exposed: Secondary | ICD-10-CM | POA: Diagnosis not present

## 2022-06-11 DIAGNOSIS — M329 Systemic lupus erythematosus, unspecified: Secondary | ICD-10-CM | POA: Diagnosis not present

## 2022-06-11 DIAGNOSIS — L97812 Non-pressure chronic ulcer of other part of right lower leg with fat layer exposed: Secondary | ICD-10-CM | POA: Diagnosis not present

## 2022-06-14 NOTE — Progress Notes (Addendum)
JENILLE, LASZLO ([id number]) [id number] Page 1 of 8 Visit Report for 06/11/2022 Arrival Information Details Patient Name: Date of Service: Kelly [name], Kelly [name]. 06/11/2022 3:15 PM Medical Record Number: [id number] Patient Account Number: [id number] Date of Birth/Sex: Treating RN: 06-17-1996 (26 y.o. [name], [name] Primary Care Tolulope Pinkett: [name] Other Clinician: Referring Juanya Villavicencio: Treating Kiev Labrosse/Extender: Elsworth Soho in Treatment: 69 Visit Information History Since Last Visit Added or deleted any medications: No Patient Arrived: Ambulatory Any new allergies or adverse reactions: No Arrival Time: 15:30 Had a fall or experienced change in No Accompanied By: husband activities of daily living that may affect Transfer Assistance: None risk of falls: Patient Requires Transmission-Based Precautions: No Signs or symptoms of abuse/neglect since last visito No Patient Has Alerts: Yes Hospitalized since last visit: No Patient Alerts: Left ABi=1.18 Implantable device outside of the clinic excluding No cellular tissue based products placed in the center since last visit: Has Dressing in Place as Prescribed: Yes Pain Present Now: No Electronic Signature(s) Signed: 06/11/2022 1:40:46 PM By: Erenest Blank Entered By: Erenest Blank on 06/11/2022 15:30:32 -------------------------------------------------------------------------------- Clinic Level of Care Assessment Details Patient Name: Date of Service: Kelly [name], Kelly [name]. 06/11/2022 3:15 PM Medical Record Number: [id number] Patient Account Number: [id number] Date of Birth/Sex: Treating RN: 1996-06-25 (26 y.o. [name], [name] Primary Care Philomina Leon: [name] Other Clinician: Referring Ellanore Vanhook: Treating Maki Sweetser/Extender: Elsworth Soho in Treatment: 69 Clinic Level of Care Assessment Items TOOL 4 Quantity Score X- 1  0 Use when only an EandM is performed on FOLLOW-UP visit ASSESSMENTS - Nursing Assessment / Reassessment X- 1 10 Reassessment of Co-morbidities (includes updates in patient status) X- 1 5 Reassessment of Adherence to Treatment Plan ASSESSMENTS - Wound and Skin A ssessment / Reassessment X - Simple Wound Assessment / Reassessment - one wound 1 5 []  - 0 Complex Wound Assessment / Reassessment - multiple wounds []  - 0 Dermatologic / Skin Assessment (not related to wound area) ASSESSMENTS - Focused Assessment X- 1 5 Circumferential Edema Measurements - multi extremities []  - 0 Nutritional Assessment / Counseling / Intervention Kelly [name], Kelly [name] ([id number]) [id number] Page 2 of 8 []  - 0 Lower Extremity Assessment (monofilament, tuning fork, pulses) []  - 0 Peripheral Arterial Disease Assessment (using hand held doppler) ASSESSMENTS - Ostomy and/or Continence Assessment and Care []  - 0 Incontinence Assessment and Management []  - 0 Ostomy Care Assessment and Management (repouching, etc.) PROCESS - Coordination of Care X - Simple Patient / Family Education for ongoing care 1 15 []  - 0 Complex (extensive) Patient / Family Education for ongoing care X- 1 10 Staff obtains Programmer, systems, Records, T Results / Process Orders est []  - 0 Staff telephones HHA, Nursing Homes / Clarify orders / etc []  - 0 Routine Transfer to another Facility (non-emergent condition) []  - 0 Routine Hospital Admission (non-emergent condition) []  - 0 New Admissions / Biomedical engineer / Ordering NPWT Apligraf, etc. , []  - 0 Emergency Hospital Admission (emergent condition) X- 1 10 Simple Discharge Coordination []  - 0 Complex (extensive) Discharge Coordination PROCESS - Special Needs []  - 0 Pediatric / Minor Patient Management []  - 0 Isolation Patient Management []  - 0 Hearing / Language / Visual special needs []  - 0 Assessment of Community assistance (transportation,  D/C planning, etc.) []  - 0 Additional assistance / Altered mentation []  - 0 Support Surface(s) Assessment (bed, cushion, seat, etc.) INTERVENTIONS - Wound Cleansing / Measurement X - Simple Wound Cleansing - one wound 1 5 []  -  0 Complex Wound Cleansing - multiple wounds X- 1 5 Wound Imaging (photographs - any number of wounds) []  - 0 Wound Tracing (instead of photographs) X- 1 5 Simple Wound Measurement - one wound []  - 0 Complex Wound Measurement - multiple wounds INTERVENTIONS - Wound Dressings X - Small Wound Dressing one or multiple wounds 1 10 []  - 0 Medium Wound Dressing one or multiple wounds []  - 0 Large Wound Dressing one or multiple wounds X- 1 5 Application of Medications - topical []  - 0 Application of Medications - injection INTERVENTIONS - Miscellaneous []  - 0 External ear exam []  - 0 Specimen Collection (cultures, biopsies, blood, body fluids, etc.) []  - 0 Specimen(s) / Culture(s) sent or taken to Lab for analysis []  - 0 Patient Transfer (multiple staff / / Similar devices) []  - 0 Simple Staple / Suture removal (25 or less) []  - 0 Complex Staple / Suture removal (26 or more) []  - 0 Hypo / Hyperglycemic Management (close monitor of Blood Glucose) ( ) [id number] Page 3 of 8 []  - 0 Ankle / Brachial Index (ABI) - do not check if billed separately X- 1 5 Vital Signs Has the patient been seen at the hospital within the last three years: Yes Total Score: 95 Level Of Care: New/Established - Level 3 Electronic Signature(s) Signed: 08/29/2022 5:36:43 PM By: RN Entered By: on 06/21/2022 08:21:54 -------------------------------------------------------------------------------- Encounter Discharge Information Details Patient Name: Date of Service: Kelly H [name]. 06/11/2022 3:15 PM Medical Record Number: Patient Account Number: Date  of Birth/Sex: Treating RN: October 27, 1995 (26 y.o. [id number], [name] Primary Care Shakyla Nolley: 11-03-1984 Other Clinician: Referring Makela Niehoff: Treating Layn Kye/Extender: in Treatment: 29 Encounter Discharge Information Items Discharge Condition: Stable Ambulatory Status: Ambulatory Discharge Destination: Home Transportation: Private Auto Accompanied By: husband Schedule Follow-up Appointment: Yes Clinical Summary of Care: Patient Declined Electronic Signature(s) Signed: 08/29/2022 5:36:43 PM By: Fonnie Mu RN Entered By: 06/23/2022 on 06/21/2022 08:22:28 -------------------------------------------------------------------------------- Lower Extremity Assessment Details Patient Name: Date of Service: 06/13/2022 Kelly [id number]. 06/11/2022 3:15 PM Medical Record Number: 03/19/1996 Patient Account Number: [id number] Date of Birth/Sex: Treating RN: 1996/06/19 (26 y.o. Dione Booze, [name] Primary Care Bart Ashford: Gennaro Africa Other Clinician: Referring Rhonna Holster: Treating Baltazar Pekala/Extender: 78 in Treatment: 69 Edema Assessment Assessed: Fonnie Mu: No] Fonnie Mu: No] Edema: [Left: Yes] [Right: Yes] Calf Left: Right: Point of Measurement: 27 cm From Medial Instep 33 cm Ankle Left: Right: Point of Measurement: 8 cm From Medial Instep 22.6 cm Vascular Assessment Calamari, Kelly [name] (06/23/2022) [Right:[id number] Page 4 of 8] Pulses: Dorsalis Pedis Palpable: [Right:Yes] Electronic Signature(s) Signed: 06/11/2022 1:40:46 PM By: Cherly Anderson Signed: 06/14/2022 4:05:37 PM By: [id number] RN Entered By: [id number] on 06/11/2022 15:37:29 -------------------------------------------------------------------------------- Multi Wound Chart Details Patient Name: Date of Service: Kelly [name] Kelly H [name]. 06/11/2022 3:15 PM Medical Record Number: Gennaro Africa Patient Account  Number: [id number] Date of Birth/Sex: Treating RN: June 26, 1996 (26 y.o. [id number], [name] Primary Care Varshini Arrants: 06/13/2022 Other Clinician: Referring Oree Mirelez: Treating Jonella Redditt/Extender: Thayer Dallas in Treatment: 69 Vital Signs Height(in): 62 Pulse(bpm): 102 Weight(lbs): Blood Pressure(mmHg): 124/88 Body Mass Index(BMI): Temperature(F): 98 Respiratory Rate(breaths/min): 18 [12:Photos:] [N/A:N/A] Right, Anterior Lower Leg N/A N/A Wound Location: Gradually Appeared N/A N/A Wounding Event: Auto-immune N/A N/A Primary Etiology: Asthma, Lupus Erythematosus N/A N/A Comorbid History: 03/19/2022 N/A N/A Date Acquired: 12 N/A N/A Weeks of Treatment: Open N/A N/A Wound  Status: No N/A N/A Wound Recurrence: 0.1x0.1x0.1 N/A N/A Measurements L x W x D (cm) 0.008 N/A N/A A (cm) : rea 0.001 N/A N/A Volume (cm) : 98.60% N/A N/A % Reduction in A rea: 99.10% N/A N/A % Reduction in Volume: Full Thickness Without Exposed N/A N/A Classification: Support Structures Medium N/A N/A Exudate A mount: Serosanguineous N/A N/A Exudate Type: red, brown N/A N/A Exudate Color: Epibole N/A N/A Wound Margin: None Present (0%) N/A N/A Granulation A mount: None Present (0%) N/A N/A Necrotic A mount: Fat Layer (Subcutaneous Tissue): Yes N/A N/A Exposed Structures: Large (67-100%) N/A N/A Epithelialization: Excoriation: No N/A N/A Periwound Skin Texture: Induration: No Callus: No Crepitus: No Rash: No Scarring: No Maceration: No N/A N/A Periwound Skin Moisture: Dry/Scaly: No Atrophie Blanche: No N/A N/A Periwound Skin Color: Cyanosis: No Ecchymosis: No Kelly [name], Kelly [name] ([id number]) [id number] Page 5 of 8 Erythema: No Hemosiderin Staining: No Mottled: No Pallor: No Rubor: No No Abnormality N/A N/A Temperature: Treatment Notes Electronic Signature(s) Signed: 06/11/2022 3:56:07 PM By: Geralyn Corwin  DO Signed: 06/14/2022 4:05:37 PM By: Fonnie Mu RN Entered By: Geralyn Corwin on 06/11/2022 15:51:58 -------------------------------------------------------------------------------- Multi-Disciplinary Care Plan Details Patient Name: Date of Service: Kelly Milling Kelly H [name]. 06/11/2022 3:15 PM Medical Record Number: [id number] Patient Account Number: [id number] Date of Birth/Sex: Treating RN: Apr 14, 1996 (26 y.o. Kelly [name], [name] Primary Care Mounir Skipper: Dione Booze Other Clinician: Referring Jakhia Buxton: Treating Dayona Shaheen/Extender: Gennaro Africa in Treatment: 69 Multidisciplinary Care Plan reviewed with physician Active Inactive Electronic Signature(s) Signed: 06/20/2022 6:57:47 PM By: Shawn Stall RN, BSN Signed: 08/29/2022 5:36:43 PM By: Fonnie Mu RN Previous Signature: 06/14/2022 4:05:37 PM Version By: Fonnie Mu RN Entered By: Shawn Stall on 06/18/2022 17:52:14 -------------------------------------------------------------------------------- Pain Assessment Details Patient Name: Date of Service: Kelly Milling Kelly Cherly Anderson. 06/11/2022 3:15 PM Medical Record Number: [id number] Patient Account Number: [id number] Date of Birth/Sex: Treating RN: Jan 01, 1996 (26 y.o. Kelly [name], [name] Primary Care Ranferi Clingan: Dione Booze Other Clinician: Referring Oluwaseun Bruyere: Treating Kyley Laurel/Extender: Gennaro Africa in Treatment: 69 Active Problems Location of Pain Severity and Description of Pain Patient Has Paino No Site Locations [place], [facility] [place] ([id number]) [id number] Page 6 of 8 Pain Management and Medication Current Pain Management: Electronic Signature(s) Signed: 06/11/2022 1:40:46 PM By: Thayer Dallas Signed: 06/14/2022 4:05:37 PM By: Fonnie Mu RN Entered By: Thayer Dallas on 06/11/2022  15:31:11 -------------------------------------------------------------------------------- Patient/Caregiver Education Details Patient Name: Date of Service: Kelly [name] 10/16/2023andnbsp3:15 PM Medical Record Number: [id number] Patient Account Number: [id number] Date of Birth/Gender: Treating RN: 1995-11-16 (26 y.o. Kelly [name], [name] Primary Care Physician: Dione Booze Other Clinician: Referring Physician: Treating Physician/Extender: Gennaro Africa in Treatment: 25 Education Assessment Education Provided To: Patient Education Topics Provided Wound/Skin Impairment: Methods: Explain/Verbal Responses: State content correctly [employer]) Signed: 06/14/2022 4:05:37 PM By: Fonnie Mu RN Entered By: Fonnie Mu on 06/11/2022 15:31:24 -------------------------------------------------------------------------------- Wound Assessment Details Patient Name: Date of Service: Kelly Milling Kelly Cherly Anderson. 06/11/2022 3:15 PM Medical Record Number: [id number] Patient Account Number: [id number] Date of Birth/Sex: Treating RN: Mar 28, 1996 (26 y.o. Kelly [name] Primary Care Tameisha Covell: Dione Booze Other Clinician: Zettie Pho ([id number]) [id number] Page 7 of 8 Referring Neema Fluegge: Treating Jolan Mealor/Extender: Gennaro Africa in Treatment: 69 Wound Status Wound Number: [id number] Primary Etiology: Auto-immune Wound Location: Right, Anterior Lower Leg Wound Status: Healed - Epithelialized Wounding Event: Gradually Appeared Comorbid History: Asthma, Lupus Erythematosus Date Acquired: 03/19/2022 Weeks Of Treatment: 12 Clustered Wound: No Photos Wound  Measurements Length: (cm) Width: (cm) Depth: (cm) Area: (cm) Volume: (cm) 0 % Reduction in Area: 100% 0 % Reduction in Volume: 100% 0 Epithelialization: Large (67-100%) 0 Tunneling: No 0 Undermining: No Wound  Description Classification: Full Thickness Without Exposed Support Wound Margin: Epibole Exudate Amount: Medium Exudate Type: Serosanguineous Exudate Color: red, brown Structures Foul Odor After Cleansing: No Slough/Fibrino Yes Wound Bed Granulation Amount: None Present (0%) Exposed Structure Necrotic Amount: None Present (0%) Fat Layer (Subcutaneous Tissue) Exposed: Yes Periwound Skin Texture Texture Color No Abnormalities Noted: Yes No Abnormalities Noted: Yes Moisture Temperature / Pain No Abnormalities Noted: Yes Temperature: No Abnormality Electronic Signature(s) Signed: 06/20/2022 6:57:47 PM By: Shawn Stall RN, BSN Signed: 08/29/2022 5:36:43 PM By: Fonnie Mu RN Previous Signature: 06/11/2022 1:40:46 PM Version By: Thayer Dallas Previous Signature: 06/14/2022 4:05:37 PM Version By: Fonnie Mu RN Entered By: Shawn Stall on 06/18/2022 17:51:46 -------------------------------------------------------------------------------- Vitals Details Patient Name: Date of Service: Kelly Milling Kelly H [name]. 06/11/2022 3:15 PM Medical Record Number: [id number] Patient Account Number: [id number] Date of Birth/Sex: Treating RN: 1995-12-26 (26 y.o. Kelly [name], [name] Primary Care Rishik Tubby: Dione Booze Other Clinician: Referring Samier Jaco: Treating Leman Martinek/Extender: Gennaro Africa in Treatment: [address], [place] Kelly [name] ([id number]) [id number] Page 8 of 8 Vital Signs Time Taken: 15:30 Temperature (F): 98 Height (in): 62 Pulse (bpm): 102 Respiratory Rate (breaths/min): 18 Blood Pressure (mmHg): 124/88 Reference Range: 80 - 120 mg / dl Electronic Signature(s) Signed: 06/11/2022 1:40:46 PM By: Thayer Dallas Entered By: Thayer Dallas on 06/11/2022 15:30:59

## 2022-06-14 NOTE — Progress Notes (Signed)
LEBA, TIBBITTS ([id number]) [id number] Page 1 of 8 Visit Report for 06/11/2022 Chief Complaint Document Details Patient Name: Date of Service: Kelly [name]. 06/11/2022 3:15 PM Medical Record Number: [id number] Patient Account Number: [id number] Date of Birth/Sex: Treating RN: 06/19/1996 (26 y.o. Kelly [name] Primary Care Provider: Dione Booze Other Clinician: Referring Provider: Treating Provider/Extender: Kelly [name] in Treatment: 78 Information Obtained from: Patient Chief Complaint Bilateral lower extremity wounds 04/21/21: left lower extremity wound s/p cellulitis 09/01/21; right lower extremity wound Electronic Signature(s) Signed: 06/11/2022 3:56:07 PM By: Geralyn Corwin DO Entered By: Geralyn Corwin on 06/11/2022 15:53:05 -------------------------------------------------------------------------------- HPI Details Patient Name: Date of Service: Kelly [name] [name] 06/11/2022 3:15 PM Medical Record Number: [id number] Patient Account Number: [id number] Date of Birth/Sex: Treating RN: [birth year] (26 y.o. Kelly [name] Primary Care Provider: Dione Booze Other Clinician: Referring Provider: Treating Provider/Extender: Kelly [name] in Treatment: 40 History of Present Illness HPI Description: Admission 6/21 [name] with a past medical history of systemic lupus erythematosus that presents with bilateral lower extremity wounds that started in April 2022. She has had skin issues in the past where she developed very small wounds but these healed with time. She has never had open wounds like she does on her legs. She currently reports minimal drainage to the wounds. She does have pain to these areas but has been overall stable. She denies signs of infection. She has visited the ED for this issue and was recently prescribed  doxycycline for possible cellulitis. She follows with [facility] for her SLE and is currently on Plaquenil, prednisone and CellCept. 6/27; patient presents for 1 week follow-up. She states she saw her rheumatologist on 6/22 and prednisone was increased from 10 mg to 20 mg daily. She is currently at the highest dose of Plaquenil and CellCept and is going to try an infusion later today at her regimen. She reports improvement to the wounds. She has been using collagen with dressing changes. She currently denies signs of infection. Readmission 8/26 Patient presents to clinic today for open wounds to her left lower extremity. Her previous wounds that were treated have closed. She states that on 8/9 she developed cellulitis and was treated with 2 rounds of antibiotics. Her cellulitis has resolved. She had blisters that opened and are now large wounds. She has seen her dermatologist and rheumatologist since she has systemic lupus and a hard time healing. No changes have been made to her medications. She was recommended antibiotic ointment by her dermatologist to her wounds She picked up the antibiotic ointment today.. She reports pain to these areas. She denies purulent drainage, increased warmth or erythema to the left lower extremity. She currently keeps the areas covered. Of note she had an Achilles tendon rupture on her right leg and is currently in a soft cast. 9/13; patient presents for follow-up. She missed her last clinic appointment. She has been using Santyl to the wound beds daily. She has no issues or complaints today. She reports the Santyl is helping clean up the wounds. She currently denies signs of infection. 9/22; patient states that 1 week ago she was seen by dermatology and they placed an Unna boot with Bactroban on her left lower extremity. Today she presents with a Unna boot in place. She currently denies signs of infection. 10/7; patient presents for follow-up. She reports  improvement. She has been using Santyl to the areas of nonviable tissue and  collagen to granulation tissue. She has been approved for infusions for her lupus. She is going to start these soon. These will be injection she can do at home. She currently denies signs of infection. Kelly [name], Kelly [name] ([id number]) [id number] Page 2 of 8 10/21; patient presents for follow-up. She reports continued improvement to her wound healing. She has been using Santyl to all areas except for the superior posterior left leg wound and she is using collagen to this. She has started her injection therapy for lupus. She started 1 week ago. She currently denies signs of infection. 11/3; patient presents for follow-up. She has no issues or complaints today. She is on her third week of injection therapy for lupus and tolerating this well. She has no issues or complaints today. She denies signs of infection. 11/17; patient presents for 2-week follow-up. She reports improvement in wound healing. She denies signs of infection. 12/1; patient presents for follow-up. She has been using collagen to the anterior left lower extremity wounds and Santyl to the left lateral malleolus. She has no issues or complaints today. 12/15; patient presents for follow-up. She is using collagen to the 2 anterior left leg wounds and Santyl to the left lateral malleolus wound. She reports improvement in wound healing. She denies signs of infection. 12/22; patient presents because she is having purulent drainage from her anterior left leg wound. She reports no inciting event and states that the area started draining spontaneously. She was hoping that the drainage would stop however this has not improved. She states this started 5 days ago. She reports tenderness to the wound bed. 12/29; patient presents for follow-up. She was able to take Augmentin for 4 days but did not tolerate the GI side effects. She continues to take  doxycycline. She reports improvement in drainage. She no longer has increased warmth to the surrounding wound bed. She ran out of [place] and has been using Vaseline to the wound beds. 1/6; Patient presents for follow-up. She has been taking Keflex without issues. She reports improvement in drainage and wound healing T the left lower o extremity. Unfortunately she developed another wound spontaneously to the right anterior lower extremity. She denies systemic signs of infection. 1/16; patient presents for follow-up. Patient completed her first prescription of Keflex and has not started the second. She reports no more drainage to the wound beds. She states there was an issue with insurance however she is going to be able to obtain Santyl soon. She has been using Dakin's wet-to-dry dressings to the right anterior leg and left lateral ankle. She has been using collagen to the distal anterior left leg wound and reports that the proximal anterior left leg wound has healed. She denies systemic signs of infection. 1/31; patient presents for follow-up. She reports improvement to the left leg wounds. She reports increased inflammation to the right leg wound. She thinks she is having a reaction to the foam border dressing on the right leg. She is unable to obtain Santyl. She has been using Dakin's wet-to-dry dressings to the right anterior leg and left lateral ankle. She has been using collagen to the distal anterior left leg wound. 2/13; patient presents for follow-up. She reports improvement in her wound healing. One of the wounds to the anterior aspect of the left lower leg has slightly reopened. She denies signs of infection. 2/27; patient presents for follow-up. She has no issues or complaints today. She denies signs of infection. 3/20; patient presents for follow-up. She has been using  Medihoney to the right lower extremity wound and left lateral ankle wound. She continues to use collagen to the left  anterior leg wound. She has no issues or complaints today. She denies signs of infection. She reports improvement in wound 4/10; patient has been using Medihoney to the right anterior lower leg wound and silver collagen on the left including the left ankle and left anterior. Fortunately the left anterior lower leg wound is healed. Both of the areas on the left ankle and the right anterior lower leg appear clean. These wounds are in the setting of chronic lupus. She has had multitude of chronic wounds and a rash on her arms. She tells me she has had both her arms and legs biopsied by dermatology at [facility] and also follows with rheumatology at [facility] although she is not had an appointment this year. She does not have a history of blood clots either arterial or venous. 4/24; patient presents for follow-up. She has been using collagen to the open wound beds. She has developed new areas to her right lower leg. She currently denies signs of infection. 5/9; patient presents for follow-up. She states she is seeing her rheumatologist next week. She has been using Medihoney to all the wound beds except for the lateral left malleolus. She has been using collegen here. She reports improvement to some areas and other areas are flaring due to her lupus. She currently denies signs of infection. 5/23; Patient saw her rheumatologist last week. She has no active lupus activity at this time. Other options for treatment were given. She is going to continue with her current therapy of weekly injections. She reports improvement to Her wound healing on the left ankle. She has been putting Medihoney here. On the right lower extremity she has had a flareup and has 2 open ulcers. She currently denies signs of infection including increased warmth, erythema or purulent drainage. She has been using Medihoney here as well. 7/10; patient presents for follow-up. She was evaluated in the ED on 6/21 and prescribed Doxycycline For  worsening pain to her right lower extremity with increased erythema. She subsequently developed a systemic reaction from the antibiotic and had A diffuse rash. There was concern that her lupus is not well controlled. It was recommended that she start Belimumab. She states she is starting the infusions next week. She has using silver alginate to the wound beds. She currently denies systemic signs of infection. 7/24; patient presents for follow-up. She started her infusion on 7/18 and tolerated this well. She has been using Medihoney to the wound beds. The anterior right leg wound has increased in size. She is currently on Keflex prescribed by her primary care physician. She denies signs of systemic infection. 8/1; patient presents for follow-up. She has been using Medihoney and Dakin's wet-to-dry dressings to the wound beds. She has no issues or complaints today. She denies signs of infection. The posterior right leg wound has healed. 8/14; patient presents for follow-up. She has been using Medihoney to the right lateral leg wound and Dakin's wet-to-dry to the right medial leg wound. She reports improvement in her chronic pain. She is scheduled to have her second infusion this week. She denies signs of infection. 8/28; patient presents for follow-up. She has been using Medihoney to the right lateral leg wound and Dakin's wet-to-dry to the right medial leg wound. 2 weeks ago she developed increased swelling to her foot and drainage to the wound beds. She was started on Keflex and has  been taking this for almost 10 days. Despite this her symptoms have not improved. She has developed increased redness and warmth to the posterior leg over the past several days. She has serous drainage on exam from the wound sites on palpation. She last had her last belimumab infusion on the 18th. 9/11; patient presents for follow-up. She went to the ED after our last clinic visit. She was admitted and given IV antibiotics  for concern for of abscess and cellulitis. She was discharged with Augmentin. She reports improvement in her symptoms. She has been using Medihoney and Dakin's wet-to-dry dressings. 9/25; patient presents for follow-up. She reports improvement in her wound healing however she is having drainage from the lateral aspect of her leg. There is a pinpoint opening here. She denies increased warmth, erythema or purulent drainage. She is not systemically unwell. She is no longer taking Augmentin. 10/2; patient presents for follow-up. She completed her course of Augmentin. She reports improvement in her wound healing and denies drainage. She has been using Medihoney to the remaining wound. She has no issues or complaints today. Kelly [name], Kelly [name] ([id number]) [id number] Page 3 of 8 10/16; patient presents for follow-up. She has been using Medihoney to the last remaining wound bed. This has healed. She has no issues or complaints today. Electronic Signature(s) Signed: 06/11/2022 3:56:07 PM By: Geralyn Corwin DO Entered By: Geralyn Corwin on 06/11/2022 15:53:46 -------------------------------------------------------------------------------- Physical Exam Details Patient Name: Date of Service: Kelly [name]. 06/11/2022 3:15 PM Medical Record Number: [id number] Patient Account Number: [id number] Date of Birth/Sex: Treating RN: 1996/07/04 (26 y.o. Kelly [name] Primary Care Provider: Dione Booze Other Clinician: Referring Provider: Treating Provider/Extender: Kelly [name] in Treatment: 69 Constitutional respirations regular, non-labored and within target range for patient.. Cardiovascular 2+ dorsalis pedis/posterior tibialis pulses. Psychiatric pleasant and cooperative. Notes Right lower extremity: T the anterior aspect there is epithelization to the previous wound site. o No open wounds to either of her lower  extremities. Electronic Signature(s) Signed: 06/11/2022 3:56:07 PM By: Geralyn Corwin DO Entered By: Geralyn Corwin on 06/11/2022 15:54:28 -------------------------------------------------------------------------------- Physician Orders Details Patient Name: Date of Service: Kelly [name] RIA Cherly Anderson. 06/11/2022 3:15 PM Medical Record Number: [id number] Patient Account Number: [id number] Date of Birth/Sex: Treating RN: 1996-06-17 (26 y.o. Kelly [name] Primary Care Provider: Dione Booze Other Clinician: Referring Provider: Treating Provider/Extender: Kelly [name] in Treatment: 22 Verbal / Phone Orders: No Diagnosis Coding ICD-10 Coding Code Description [phone] Non-pressure chronic ulcer of other part of left lower leg with fat layer exposed L97.812 Non-pressure chronic ulcer of other part of right lower leg with fat layer exposed M32.9 Systemic lupus erythematosus, unspecified Wound Treatment Electronic Signature(s) Signed: 06/11/2022 3:56:07 PM By: Grace Blight, Kelly [name] ([id number]) [id number] Page 4 of 8 Entered By: Geralyn Corwin on 06/11/2022 15:54:35 -------------------------------------------------------------------------------- Problem List Details Patient Name: Date of Service: Kelly [name], Kelly [name]. 06/11/2022 3:15 PM Medical Record Number: [id number] Patient Account Number: [id number] Date of Birth/Sex: Treating RN: December 05, 1995 (26 y.o. Kelly [name] Primary Care Provider: Dione Booze Other Clinician: Referring Provider: Treating Provider/Extender: Kelly [name] in Treatment: 69 Active Problems ICD-10 Encounter Code Description Active Date MDM Diagnosis [phone] Non-pressure chronic ulcer of other part of left lower leg with fat layer exposed11/10/2020 No Yes L97.812 Non-pressure chronic ulcer of other part of right lower leg with fat layer  06/29/2021 No Yes exposed M32.9 Systemic lupus erythematosus, unspecified 02/14/2021 No Yes Inactive Problems ICD-10  Code Description Active Date Inactive Date L97.819 Non-pressure chronic ulcer of other part of right lower leg with unspecified severity 02/13/2021 02/13/2021 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 with unspecified severity 02/13/2021 02/13/2021 E45.409W Unspecified open wound, left lower leg, initial encounter 04/21/2021 04/21/2021 S91.002A Unspecified open wound, left ankle, initial encounter 04/21/2021 04/21/2021 Resolved Problems ICD-10 Code Description Active Date Resolved Date L03.116 Cellulitis of left lower limb 08/24/2021 08/24/2021 Electronic Signature(s) Signed: 06/11/2022 3:56:07 PM By: Geralyn Corwin DO Entered By: Geralyn Corwin on 06/11/2022 15:51:54 Zettie Pho ([id number]) [id number] Page 5 of 8 -------------------------------------------------------------------------------- Progress Note Details Patient Name: Date of Service: Kelly [name], Kelly [name]. 06/11/2022 3:15 PM Medical Record Number: [id number] Patient Account Number: [id number] Date of Birth/Sex: Treating RN: Oct 07, 1995 (26 y.o. Kelly [name] Primary Care Provider: Dione Booze Other Clinician: Referring Provider: Treating Provider/Extender: Kelly [name] in Treatment: 69 Subjective Chief Complaint Information obtained from Patient Bilateral lower extremity wounds 04/21/21: left lower extremity wound s/p cellulitis 09/01/21; right lower extremity wound History of Present Illness (HPI) Admission 6/21 [name] with a past medical history of systemic lupus erythematosus that presents with bilateral lower extremity wounds that started in April 2022. She has had skin issues in the past where she developed very small wounds but these healed with time. She has never had  open wounds like she does on her legs. She currently reports minimal drainage to the wounds. She does have pain to these areas but has been overall stable. She denies signs of infection. She has visited the ED for this issue and was recently prescribed doxycycline for possible cellulitis. She follows with [facility] for her SLE and is currently on Plaquenil, prednisone and CellCept. 6/27; patient presents for 1 week follow-up. She states she saw her rheumatologist on 6/22 and prednisone was increased from 10 mg to 20 mg daily. She is currently at the highest dose of Plaquenil and CellCept and is going to try an infusion later today at her regimen. She reports improvement to the wounds. She has been using collagen with dressing changes. She currently denies signs of infection. Readmission 8/26 Patient presents to clinic today for open wounds to her left lower extremity. Her previous wounds that were treated have closed. She states that on 8/9 she developed cellulitis and was treated with 2 rounds of antibiotics. Her cellulitis has resolved. She had blisters that opened and are now large wounds. She has seen her dermatologist and rheumatologist since she has systemic lupus and a hard time healing. No changes have been made to her medications. She was recommended antibiotic ointment by her dermatologist to her wounds She picked up the antibiotic ointment today.. She reports pain to these areas. She denies purulent drainage, increased warmth or erythema to the left lower extremity. She currently keeps the areas covered. Of note she had an Achilles tendon rupture on her right leg and is currently in a soft cast. 9/13; patient presents for follow-up. She missed her last clinic appointment. She has been using Santyl to the wound beds daily. She has no issues or complaints today. She reports the Santyl is helping clean up the wounds. She currently denies signs of infection. 9/22; patient states that 1  week ago she was seen by dermatology and they placed an Unna boot with Bactroban on her left lower extremity. Today she presents with a Unna boot in place. She currently denies signs of infection. 10/7; patient  presents for follow-up. She reports improvement. She has been using Santyl to the areas of nonviable tissue and collagen to granulation tissue. She has been approved for infusions for her lupus. She is going to start these soon. These will be injection she can do at home. She currently denies signs of infection. 10/21; patient presents for follow-up. She reports continued improvement to her wound healing. She has been using Santyl to all areas except for the superior posterior left leg wound and she is using collagen to this. She has started her injection therapy for lupus. She started 1 week ago. She currently denies signs of infection. 11/3; patient presents for follow-up. She has no issues or complaints today. She is on her third week of injection therapy for lupus and tolerating this well. She has no issues or complaints today. She denies signs of infection. 11/17; patient presents for 2-week follow-up. She reports improvement in wound healing. She denies signs of infection. 12/1; patient presents for follow-up. She has been using collagen to the anterior left lower extremity wounds and Santyl to the left lateral malleolus. She has no issues or complaints today. 12/15; patient presents for follow-up. She is using collagen to the 2 anterior left leg wounds and Santyl to the left lateral malleolus wound. She reports improvement in wound healing. She denies signs of infection. 12/22; patient presents because she is having purulent drainage from her anterior left leg wound. She reports no inciting event and states that the area started draining spontaneously. She was hoping that the drainage would stop however this has not improved. She states this started 5 days ago. She reports tenderness  to the wound bed. 12/29; patient presents for follow-up. She was able to take Augmentin for 4 days but did not tolerate the GI side effects. She continues to take doxycycline. She reports improvement in drainage. She no longer has increased warmth to the surrounding wound bed. She ran out of [place] and has been using Vaseline to the wound beds. 1/6; Patient presents for follow-up. She has been taking Keflex without issues. She reports improvement in drainage and wound healing T the left lower o extremity. Unfortunately she developed another wound spontaneously to the right anterior lower extremity. She denies systemic signs of infection. 1/16; patient presents for follow-up. Patient completed her first prescription of Keflex and has not started the second. She reports no more drainage to the wound beds. She states there was an issue with insurance however she is going to be able to obtain Santyl soon. She has been using Dakin's wet-to-dry dressings to the right anterior leg and left lateral ankle. She has been using collagen to the distal anterior left leg wound and reports that the proximal anterior left leg wound has healed. She denies systemic signs of infection. 1/31; patient presents for follow-up. She reports improvement to the left leg wounds. She reports increased inflammation to the right leg wound. She thinks she is having a reaction to the foam border dressing on the right leg. She is unable to obtain Santyl. She has been using Dakin's wet-to-dry dressings to the right anterior leg and left lateral ankle. She has been using collagen to the distal anterior left leg wound. 2/13; patient presents for follow-up. She reports improvement in her wound healing. One of the wounds to the anterior aspect of the left lower leg has slightly Kelly [name], Kelly [name] ([id number]) [id number] Page 6 of 8 reopened. She denies signs of infection. 2/27; patient presents for follow-up.  She has  no issues or complaints today. She denies signs of infection. 3/20; patient presents for follow-up. She has been using Medihoney to the right lower extremity wound and left lateral ankle wound. She continues to use collagen to the left anterior leg wound. She has no issues or complaints today. She denies signs of infection. She reports improvement in wound 4/10; patient has been using Medihoney to the right anterior lower leg wound and silver collagen on the left including the left ankle and left anterior. Fortunately the left anterior lower leg wound is healed. Both of the areas on the left ankle and the right anterior lower leg appear clean. These wounds are in the setting of chronic lupus. She has had multitude of chronic wounds and a rash on her arms. She tells me she has [facility] she is not had an appointment this year. She does not have a history of blood clots either arterial or venous. 4/24; patient presents for follow-up. She has been using collagen to the open wound beds. She has developed new areas to her right lower leg. She currently denies signs of infection. 5/9; patient presents for follow-up. She states she is seeing her rheumatologist next week. She has been using Medihoney to all the wound beds except for the lateral left malleolus. She has been using collegen here. She reports improvement to some areas and other areas are flaring due to her lupus. She currently denies signs of infection. 5/23; Patient saw her rheumatologist last week. She has no active lupus activity at this time. Other options for treatment were given. She is going to continue with her current therapy of weekly injections. She reports improvement to Her wound healing on the left ankle. She has been putting Medihoney here. On the right lower extremity she has had a flareup and has 2 open ulcers. She  currently denies signs of infection including increased warmth, erythema or purulent drainage. She has been using Medihoney here as well. 7/10; patient presents for follow-up. She was evaluated in the ED on 6/21 and prescribed Doxycycline For worsening pain to her right lower extremity with increased erythema. She subsequently developed a systemic reaction from the antibiotic and had A diffuse rash. There was concern that her lupus is not well controlled. It was recommended that she start Belimumab. She states she is starting the infusions next week. She has using silver alginate to the wound beds. She currently denies systemic signs of infection. 7/24; patient presents for follow-up. She started her infusion on 7/18 and tolerated this well. She has been using Medihoney to the wound beds. The anterior right leg wound has increased in size. She is currently on Keflex prescribed by her primary care physician. She denies signs of systemic infection. 8/1; patient presents for follow-up. She has been using Medihoney and Dakin's wet-to-dry dressings to the wound beds. She has no issues or complaints today. She denies signs of infection. The posterior right leg wound has healed. 8/14; patient presents for follow-up. She has been using Medihoney to the right lateral leg wound and Dakin's wet-to-dry to the right medial leg wound. She reports improvement in her chronic pain. She is scheduled to have her second infusion this week. She denies signs of infection. 8/28; patient presents for follow-up. She has been using Medihoney to the right lateral leg wound and Dakin's wet-to-dry to the right medial leg wound. 2 weeks ago she developed  increased swelling to her foot and drainage to the wound beds. She was started on Keflex and has been taking this for almost 10 days. Despite this her symptoms have not improved. She has developed increased redness and warmth to the posterior leg over the past several days. She  has serous drainage on exam from the wound sites on palpation. She last had her last belimumab infusion on the 18th. 9/11; patient presents for follow-up. She went to the ED after our last clinic visit. She was admitted and given IV antibiotics for concern for of abscess and cellulitis. She was discharged with Augmentin. She reports improvement in her symptoms. She has been using Medihoney and Dakin's wet-to-dry dressings. 9/25; patient presents for follow-up. She reports improvement in her wound healing however she is having drainage from the lateral aspect of her leg. There is a pinpoint opening here. She denies increased warmth, erythema or purulent drainage. She is not systemically unwell. She is no longer taking Augmentin. 10/2; patient presents for follow-up. She completed her course of Augmentin. She reports improvement in her wound healing and denies drainage. She has been using Medihoney to the remaining wound. She has no issues or complaints today. 10/16; patient presents for follow-up. She has been using Medihoney to the last remaining wound bed. This has healed. She has no issues or complaints today. Patient History Information obtained from Patient. Family History Diabetes - Paternal Grandparents, Hypertension - Paternal Grandparents, Thyroid Problems - Maternal Grandparents,Paternal Grandparents, No family history of Cancer, Heart Disease, Hereditary Spherocytosis, Kidney Disease, Lung Disease, Seizures, Stroke, Tuberculosis. Social History Never smoker, Marital Status - Married, Alcohol Use - Moderate, Drug Use - Current History - Marijuana, Caffeine Use - Daily - Coffee. Medical History Respiratory Patient has history of Asthma Immunological Patient has history of Lupus Erythematosus Hospitalization/Surgery History - abscess and cellulitis right lower leg 04/23/2022. Objective Constitutional respirations regular, non-labored and within target range for patient.Kelly [name],  Kelly [name] ([id number]) [id number] Page 7 of 8 Vitals Time Taken: 3:30 PM, Height: 62 in, Temperature: 98 F, Pulse: 102 bpm, Respiratory Rate: 18 breaths/min, Blood Pressure: 124/88 mmHg. Cardiovascular 2+ dorsalis pedis/posterior tibialis pulses. Psychiatric pleasant and cooperative. General Notes: Right lower extremity: T the anterior aspect there is epithelization to the previous wound site. No open wounds to either of her lower o extremities. Integumentary (Hair, Skin) Wound #12 status is Open. Original cause of wound was Gradually Appeared. The date acquired was: 03/19/2022. The wound has been in treatment 12 weeks. The wound is located on the Right,Anterior Lower Leg. The wound measures 0.1cm length x 0.1cm width x 0.1cm depth; 0.008cm^2 area and 0.001cm^3 volume. There is Fat Layer (Subcutaneous Tissue) exposed. There is no tunneling or undermining noted. There is a medium amount of serosanguineous drainage noted. The wound margin is epibole. There is no granulation within the wound bed. There is no necrotic tissue within the wound bed. The periwound skin appearance had no abnormalities noted for texture. The periwound skin appearance had no abnormalities noted for moisture. The periwound skin appearance had no abnormalities noted for color. Periwound temperature was noted as No Abnormality. Assessment Active Problems ICD-10 Non-pressure chronic ulcer of other part of left lower leg with fat layer exposed Non-pressure chronic ulcer of other part of right lower leg with fat layer exposed Systemic lupus erythematosus, unspecified Patient has done well with Medihoney. Her wound has healed. She may follow-up as needed. Plan 1. Discharge from clinic due to closed wound 2. Follow-up as needed Electronic  Signature(s) Signed: 06/11/2022 3:56:07 PM By: Geralyn Corwin DO Entered By: Geralyn Corwin on 06/11/2022  15:54:59 -------------------------------------------------------------------------------- HxROS Details Patient Name: Date of Service: Kelly [name] [name] 06/11/2022 3:15 PM Medical Record Number: [id number] Patient Account Number: [id number] Date of Birth/Sex: Treating RN: Sep 28, 1995 (26 y.o. Kelly [name] Primary Care Provider: Dione Booze Other Clinician: Referring Provider: Treating Provider/Extender: Kelly [name] in Treatment: 69 Information Obtained From Patient Respiratory Medical History: Positive for: Asthma Immunological Medical History: Positive for: Lupus Erythematosus Kelly [name], Kelly [name] ([id number]) [id number] Page 8 of 8 Immunizations Pneumococcal Vaccine: Received Pneumococcal Vaccination: No Implantable Devices None Hospitalization / Surgery History Type of Hospitalization/Surgery abscess and cellulitis right lower leg 04/23/2022 Family and Social History Cancer: No; Diabetes: Yes - Paternal Grandparents; Heart Disease: No; Hereditary Spherocytosis: No; Hypertension: Yes - Paternal Grandparents; Kidney Disease: No; Lung Disease: No; Seizures: No; Stroke: No; Thyroid Problems: Yes - Maternal Grandparents,Paternal Grandparents; Tuberculosis: No; Never smoker; Marital Status - Married; Alcohol Use: Moderate; Drug Use: Current History - Marijuana; Caffeine Use: Daily - Coffee; Financial Concerns: No; Food, Clothing or Shelter Needs: No; Support System Lacking: No Electronic Signature(s) Signed: 06/11/2022 3:56:07 PM By: Geralyn Corwin DO Signed: 06/14/2022 4:05:37 PM By: Fonnie Mu RN Entered By: Geralyn Corwin on 06/11/2022 15:53:51 -------------------------------------------------------------------------------- SuperBill Details Patient Name: Date of Service: Kelly [name] [name] 06/11/2022 Medical Record Number: [id number] Patient Account Number: [id number] Date of Birth/Sex:  Treating RN: 02/28/1996 (26 y.o. Kelly [name] Primary Care Provider: Dione Booze Other Clinician: Referring Provider: Treating Provider/Extender: Kelly [name] in Treatment: 69 Diagnosis Coding ICD-10 Codes Code Description [phone] Non-pressure chronic ulcer of other part of left lower leg with fat layer exposed L97.812 Non-pressure chronic ulcer of other part of right lower leg with fat layer exposed M32.9 Systemic lupus erythematosus, unspecified Physician Procedures : CPT4 Code Description Modifier [id number] - WC PHYS LEVEL 3 - EST PT ICD-10 Diagnosis Description L97.812 Non-pressure chronic ulcer of other part of right lower leg with fat layer exposed M32.9 Systemic lupus erythematosus, unspecified Quantity: 1 Electronic Signature(s) Signed: 06/11/2022 3:56:07 PM By: Geralyn Corwin DO Entered By: Geralyn Corwin on 06/11/2022 15:55:36

## 2022-06-24 DIAGNOSIS — B029 Zoster without complications: Secondary | ICD-10-CM

## 2022-06-24 HISTORY — DX: Zoster without complications: B02.9

## 2022-07-01 ENCOUNTER — Emergency Department (HOSPITAL_BASED_OUTPATIENT_CLINIC_OR_DEPARTMENT_OTHER)
Admission: EM | Admit: 2022-07-01 | Discharge: 2022-07-01 | Disposition: A | Discharge status: Home / Self Care | Payer: Managed Care, Other (non HMO) | Attending: Emergency Medicine | Admitting: Emergency Medicine

## 2022-07-01 ENCOUNTER — Encounter (HOSPITAL_BASED_OUTPATIENT_CLINIC_OR_DEPARTMENT_OTHER): Payer: Self-pay | Admitting: Emergency Medicine

## 2022-07-01 DIAGNOSIS — M3210 Systemic lupus erythematosus, organ or system involvement unspecified: Secondary | ICD-10-CM | POA: Diagnosis not present

## 2022-07-01 DIAGNOSIS — B029 Zoster without complications: Secondary | ICD-10-CM | POA: Diagnosis not present

## 2022-07-01 DIAGNOSIS — M329 Systemic lupus erythematosus, unspecified: Secondary | ICD-10-CM

## 2022-07-01 DIAGNOSIS — R21 Rash and other nonspecific skin eruption: Secondary | ICD-10-CM | POA: Diagnosis present

## 2022-07-01 MED ORDER — HYDROCODONE-ACETAMINOPHEN 5-325 MG PO TABS
1.0000 | ORAL_TABLET | Freq: Once | ORAL | Status: AC
  Administered 2022-07-01: 1 via ORAL
  Filled 2022-07-01: qty 1

## 2022-07-01 MED ORDER — VALACYCLOVIR HCL 1 G PO TABS: 1000.0000 mg | ORAL_TABLET | Freq: Three times a day (TID) | ORAL | 0 refills | Status: DC

## 2022-07-01 MED ORDER — MUPIROCIN CALCIUM 2 % EX CREA: 1.0000 | TOPICAL_CREAM | Freq: Two times a day (BID) | CUTANEOUS | 0 refills | Status: DC

## 2022-07-01 NOTE — Discharge Instructions (Signed)
Take your steroids and current medications as prescribed however take an extra dose of prednisone approximately 30 mg for 5 days.  Antiviral medication prescription given.  Follow-up for recheck later this week.  Return for new or worsening signs or symptoms. Topical medicine prescription given to use if no improvement.

## 2022-07-01 NOTE — ED Triage Notes (Signed)
Painful rash to R chest, axilla, arm, and back x 3 days.

## 2022-07-01 NOTE — ED Provider Notes (Signed)
[facility] EMERGENCY DEPT Provider Note   CSN: [id number] Arrival date & time: 07/01/22  3710     History  Chief Complaint  Patient presents with   Rash    Kelly [name] is a 26 y.o. female.  Patient with known lupus compliant with Plaquenil and prednisone and has outpatient follow-up presents with right arm rash which she has never had before.  Patient no longer gets joint pains and other lupus related signs since being on strong medication regimen.  Patient denies any fevers or chills, no injuries and patient has not shaved her armpit area in a few weeks to cause trauma.  No significant warmth.  No new exposures.  No other concerns or signs or symptoms.  Location is right proximal arm extending around posterior and distally.  No history of zoster.  Vaccines up-to-date.       Home Medications Prior to Admission medications   Medication Sig Start Date End Date Taking? Authorizing Provider  mupirocin cream (BACTROBAN) 2 % Apply 1 Application topically 2 (two) times daily. 07/01/22  Yes Blane Ohara, MD  valACYclovir (VALTREX) 1000 MG tablet Take 1 tablet (1,000 mg total) by mouth 3 (three) times daily. 07/01/22  Yes Blane Ohara, MD  Anifrolumab-fnia (SAPHNELO) 300 MG/2ML SOLN Inject 300 mg into the vein every 30 (thirty) days.    [provider]  ascorbic acid (VITAMIN C) 500 MG tablet Take 1 tablet (500 mg total) by mouth daily. 04/27/22   Narda Bonds, MD  azaTHIOprine (IMURAN) 50 MG tablet Take 50 mg by mouth daily. 01/30/22   [provider]  Calcium Carb-Cholecalciferol (CALCIUM 600-D) 600-10 MG-MCG TABS 1 tablet with a meal Orally Once a day for 30 day(s)    [provider]  Control Gel Formula Dressing (DUODERM CGF EXTRA THIN EX) Apply topically. Patient not taking: Reported on 06/01/2022    [provider]  enoxaparin (LOVENOX) 60 MG/0.6ML injection Inject 60 mg into the skin every 12 (twelve) hours. Patient not  taking: Reported on 06/01/2022 03/23/22 09/19/22  [provider]  HYDROcodone-acetaminophen (NORCO/VICODIN) 5-325 MG tablet Take 1 tablet by mouth every 6 (six) hours as needed for moderate pain. Patient not taking: Reported on 06/01/2022 04/26/22   Narda Bonds, MD  hydroxychloroquine (PLAQUENIL) 200 MG tablet Take 400 mg by mouth daily.    [provider]  medroxyPROGESTERone (PROVERA) 10 MG tablet Take 1 tablet (10 mg total) by mouth daily for 10 days. 06/01/22 06/11/22  Lorriane Shire, MD  molnupiravir EUA (LAGEVRIO) 200 mg CAPS capsule Take 4 capsules (800 mg total) by mouth 2 (two) times daily. Complete bottle given to you at discharge. 04/26/22   Narda Bonds, MD  predniSONE (DELTASONE) 5 MG tablet Take 5-25 mg by mouth as directed. Taking 25 mg daily currently until on the  02-21-22 will take 20 mg for a week, then titrate down 5 mg each week until taking 5 mg daily. 07/04/21   [provider]  zinc sulfate 220 (50 Zn) MG capsule Take 1 capsule (220 mg total) by mouth daily. 04/27/22   Narda Bonds, MD      Allergies    Doxycycline    Review of Systems   Review of Systems  Constitutional:  Negative for chills and fever.  HENT:  Negative for congestion.   Eyes:  Negative for visual disturbance.  Respiratory:  Negative for shortness of breath.   Cardiovascular:  Negative for chest pain.  Gastrointestinal:  Negative for  abdominal pain and vomiting.  Genitourinary:  Negative for dysuria and flank pain.  Musculoskeletal:  Negative for back pain, neck pain and neck stiffness.  Skin:  Positive for rash.  Neurological:  Negative for light-headedness and headaches.    Physical Exam Updated Vital Signs BP (!) 138/108 (BP Location: Left Arm)   Pulse 87   Temp 98.1 F (36.7 C) (Oral)   Resp 20   Ht 5\' 2"  (1.575 m)   Wt 69.4 kg   LMP 02/07/2022 (Approximate)   SpO2 100%   BMI 27.98 kg/m  Physical Exam Vitals and nursing note reviewed.   Constitutional:      General: She is not in acute distress.    Appearance: She is well-developed.  HENT:     Head: Normocephalic and atraumatic.     Mouth/Throat:     Mouth: Mucous membranes are moist.  Eyes:     General:        Right eye: No discharge.        Left eye: No discharge.     Conjunctiva/sclera: Conjunctivae normal.  Neck:     Trachea: No tracheal deviation.  Cardiovascular:     Rate and Rhythm: Normal rate.  Pulmonary:     Effort: Pulmonary effort is normal.  Abdominal:     General: There is no distension.     Palpations: Abdomen is soft.     Tenderness: There is no abdominal tenderness. There is no guarding.  Musculoskeletal:        General: Tenderness present. No swelling.     Cervical back: Normal range of motion and neck supple. No rigidity.  Skin:    General: Skin is warm.     Capillary Refill: Capillary refill takes less than 2 seconds.     Findings: Rash present.     Comments: Patient has a mix of macular with a few vesicles extending from right anterior/medial aspect of arm extending around to posterior shoulder and down to forearm.  No warmth or cellulitic component.  No drainage.  Neurological:     General: No focal deficit present.     Mental Status: She is alert.  Psychiatric:        Mood and Affect: Mood normal.     ED Results / Procedures / Treatments   Labs (all labs ordered are listed, but only abnormal results are displayed) Labs Reviewed - No data to display  EKG None  Radiology No results found.  Procedures Procedures    Medications Ordered in ED Medications  HYDROcodone-acetaminophen (NORCO/VICODIN) 5-325 MG per tablet 1 tablet (has no administration in time range)    ED Course/ Medical Decision Making/ A&P                           Medical Decision Making Risk Prescription drug management.   Patient with known lupus compliant with medications presents with isolated rash to the right arm and no new exposures.  No  signs of serious bacterial infection no fevers no systemic signs or symptoms.  Discussed differential including topical bacterial infection versus herpes zoster versus lupus related versus other.  Plan for increasing her steroid dose for 5 days for which she has extra dosing at home and prescription for antiviral.  Discussed importance of follow-up and recheck later this week.  Patient comfortable this plan stable for discharge.  Pain meds given in the ER.        Final Clinical Impression(s) / ED  Diagnoses Final diagnoses:  History of systemic lupus erythematosus (HCC)  Herpes zoster without complication    Rx / DC Orders ED Discharge Orders          Ordered    mupirocin cream (BACTROBAN) 2 %  2 times daily        07/01/22 1236    valACYclovir (VALTREX) 1000 MG tablet  3 times daily        07/01/22 1237              Blane Ohara, MD 07/01/22 1241

## 2022-07-02 ENCOUNTER — Ambulatory Visit: Payer: Managed Care, Other (non HMO) | Admitting: Student

## 2022-07-12 ENCOUNTER — Ambulatory Visit (INDEPENDENT_AMBULATORY_CARE_PROVIDER_SITE_OTHER): Payer: Managed Care, Other (non HMO) | Admitting: Obstetrics and Gynecology

## 2022-07-12 ENCOUNTER — Encounter: Payer: Self-pay | Admitting: Obstetrics and Gynecology

## 2022-07-12 VITALS — BP 133/93 | HR 111 | Ht 62.0 in | Wt 155.0 lb

## 2022-07-12 DIAGNOSIS — N911 Secondary amenorrhea: Secondary | ICD-10-CM | POA: Diagnosis not present

## 2022-07-12 MED ORDER — MEDROXYPROGESTERONE ACETATE 10 MG PO TABS: 20.0000 mg | ORAL_TABLET | Freq: Every day | ORAL | 0 refills | Status: DC

## 2022-07-12 NOTE — Progress Notes (Signed)
  GYNECOLOGY PROGRESS NOTE  History:  [name] is a 26 y.o. G1P0101 presents to [facility] office today for problem gyn visit. She reports no menstrual cycle despite a trial of oral Provera. She desires to try an conceive, but cannot start that process until her menstrual cycles return. She denies h/a, dizziness, shortness of breath, n/v, or fever/chills.    The following portions of the patient's history were reviewed and updated as appropriate: allergies, current medications, past family history, past medical history, past social history, past surgical history and problem list. Last pap smear on 12/282022 was normal.  Review of Systems:  Pertinent items are noted in HPI.   Objective:  Physical Exam Blood pressure (!) 133/93, pulse (!) 111, height 5\' 2"  (1.575 m), weight 155 lb (70.3 kg), last menstrual period 02/07/2022. VS reviewed, nursing note reviewed,  Constitutional: well developed, well nourished, no distress HEENT: normocephalic CV: normal rate Pulm/chest wall: normal effort Breast Exam: deferred Abdomen: deferred Neuro: alert and oriented x 3 Skin: warm, dry Psych: affect normal Pelvic exam: deferred  Assessment & Plan:  1. Amenorrhea, secondary - *Consult with Dr. 02/09/2022 - notified of patient's complaints, assessments, lab results, and plan started by Dr. Donavan Foil. Dr. Briscoe Deutscher recommended tx plan give 7-10 days of Provera 20 mg daily and follow-up with MD ONLY - Rx: medroxyPROGESTERone (PROVERA) 10 MG tablet; Take 2 tablets (20 mg total) by mouth daily.  Dispense: 20 tablet; Refill: 0  Total face-to-face time spent during this encounter was 5 minutes. There was 5 minutes of chart review time spent prior to this encounter. Total time spent = 10 minutes.   Laverna Peace, CNM 4:09 PM

## 2022-07-12 NOTE — Progress Notes (Signed)
Patient presents for follow up to discuss no cycles. Patient states that she did not have a cycle after the trial of provera. No other concerns.

## 2022-07-15 ENCOUNTER — Encounter: Payer: Self-pay | Admitting: Obstetrics and Gynecology

## 2022-08-23 ENCOUNTER — Ambulatory Visit (INDEPENDENT_AMBULATORY_CARE_PROVIDER_SITE_OTHER): Payer: Managed Care, Other (non HMO) | Admitting: Obstetrics and Gynecology

## 2022-08-23 ENCOUNTER — Ambulatory Visit: Payer: Managed Care, Other (non HMO) | Admitting: Obstetrics and Gynecology

## 2022-08-23 VITALS — BP 128/88 | HR 86 | Ht 62.0 in | Wt 155.4 lb

## 2022-08-23 DIAGNOSIS — Z3189 Encounter for other procreative management: Secondary | ICD-10-CM | POA: Diagnosis not present

## 2022-08-23 DIAGNOSIS — N912 Amenorrhea, unspecified: Secondary | ICD-10-CM | POA: Insufficient documentation

## 2022-08-23 DIAGNOSIS — M329 Systemic lupus erythematosus, unspecified: Secondary | ICD-10-CM

## 2022-08-23 NOTE — Progress Notes (Signed)
F/U Amenorrhea/ desire to conceive Started higher dose Provera 07/12/22 Has had cycle/ irregular bleeding/ lasting 5-14 days

## 2022-08-23 NOTE — Progress Notes (Signed)
  CC: amenorrhea, conception counseling Subjective:    Patient ID: Kelly [name], female    DOB: [birth year], 26 y.o.   MRN: [id number]  HPI 26 yo G1P1, c/s x 1 seen for discussion of amenorrhea.  Pt had amenorrhea s/p prolonged use of depo provera.  Pt did have successful withdrawal bleed with 20 mg of provera for 10-14 days.  After use of provera, the patient bled from 11/9-11/27.  She also bled from 12/12-12/14 which was mostly spotting.  Pt also had spotting on 12/25. She is currently very interested in getting pregnant even though the pregnancy would be very high risk due to preexisting lupus.  [facility] is resuming her lupus care due to insurance.  Review of history  shows that pt has been on primarily progesterone based interventions since she was a teen.  The patient is currently not on any anticoagulation.        Review of Systems     Objective:   Physical Exam Vitals:   08/23/22 1107  BP: 128/88  Pulse: 86         Assessment & Plan:   1. Systemic lupus erythematosus, unspecified SLE type, unspecified organ involvement status (HCC) Management per [facility] rheumatology.  2. Amenorrhea Seems to be responsive to provera if there is no spontaneous bleeding. Pt currently having spontaneous menses.  3. Encounter for fertility planning Pt has follow up with her rheumatology team today and again in January.  Pt advised to ask if clomid could be used to aid in fertility due to anti-estrogenic properties.   For now, advised timed intercourse every other day x 1 week around day 14 of cycle. If pt cannot take clomid or fails 6-8 months of clomid therapy, would refer to fertility specialist.  Virtual follow up in 2 months. I spent 30 minutes dedicated to the care of this patient including previsit review of records, face to face time with the patient discussing treatment options and post visit testing.   Warden Fillers, MD Faculty Attending, [facility]

## 2022-10-02 ENCOUNTER — Ambulatory Visit: Payer: Managed Care, Other (non HMO) | Admitting: Obstetrics and Gynecology

## 2022-10-02 ENCOUNTER — Encounter: Payer: Self-pay | Admitting: Obstetrics and Gynecology

## 2022-10-02 VITALS — BP 138/98 | HR 86 | Ht 62.0 in | Wt 161.8 lb

## 2022-10-02 DIAGNOSIS — Z3189 Encounter for other procreative management: Secondary | ICD-10-CM | POA: Diagnosis not present

## 2022-10-02 MED ORDER — CLOMIPHENE CITRATE 50 MG PO TABS: 50.0000 mg | ORAL_TABLET | Freq: Every day | ORAL | 3 refills | Status: DC

## 2022-10-02 NOTE — Progress Notes (Signed)
Pt presents for follow-up regarding conception. LMP 09/19/22. Pt states it lasted 5 days, and was moderate-heavy. No concerns from the pt at this time.

## 2022-10-02 NOTE — Patient Instructions (Signed)
Clomid challenge/therapy Given that patient has anovulatory cycles, discussed Clomid challenge/therapy.  She was given Rx for Provera 10mg po qd x 10 days.   She was told to watch for withdrawal bleeding after the course, the first day of bleeding will be Day 1 for her next cycle.  She was also given a Rx for Clomid 50mg po qd to take on days 5-9 of her cycle.  The risks of Clomid including ovarian hyperstimulation with possible risk of ovarian cancer as well as multiple gestation were discussed with patient.  Patient also advised to continue frequent intercourse especially around Day 14 of her upcoming cycle (qod intercourse around days 9 - 18).  By Day 35, patient was told to call if she had her period.  If patient has bleeding at end of cycle, will increase Clomid by 50mg for the following cycle; she can have a total of 6 cycles.  However if patient does not have bleeding, she was told to do a pregnancy test/come in for evaluation.   CLOMID PATIENT INSTRUCTIONS  WHY USE IT? Clomid helps your ovaries to release eggs (ovulate).  HOW TO USE IT? Clomid is taken as a pill usually on days 5,6,7,8, & 9 of your cycle.  Day 1 is the first day of your period. The dose or duration may be changed to achieve ovulation.  Provera (progesterone) may first be used to bring on a period for some patients.  If you do not get pregnant this cycle, for your next cycles, take on days 1, 2, 3, 4 and 5.  If you do not get a period, take Provera 10 mg daily for 10 days to bring on a period; the first day you get bleeding is Day 1 of your cycle. The day of ovulation on Clomid is usually between cycle day 14 and 17.  Having sexual intercourse at least every other day between cycle day 13 and 18 will improve your chances of becoming pregnant during the Clomid cycle.  You may monitor your ovulation using basal body temperature charts or with ovulation kits.  If using the ovulation predictor kits, having intercourse the day of the  surge and the two days following is recommended. If you get your period, call when it starts for an appointment with your doctor, so that an exam may be done, and another Clomid cycle can be considered if appropriate. If you do not get a period by day 35 of the cycle, please get a blood pregnancy test.  If it is negative, speak to your doctor for instructions to bring on another period and to plan a follow-up appointment.  THINGS TO KNOW: If you get pregnant while using Clomid, your chance of twins is 7% and triplets is less than 1%. Some studies have suggested the use of "fertility drugs" may increase your risk of ovarian cancers in the future.  It is unclear if these drugs increase the risk, or people who have problems with fertility are prone for these cancers.  If there is an actual risk, it is very low.  If you have a history of liver problems or ovarian cancer, it may be wise to avoid this medication.  SIDE EFFECTS: The most common side effect is hot flashes (20%). Breast tenderness, headaches, nausea, bloating may also occur at different times. Less than 3/1,000 people have dryness or loss of hair. Persistent ovarian cysts may form from the use of this medication. Ovarian hyperstimulation syndrome is a rare side effect at low   doses. Visual changes like flashes of light or blurring.      

## 2022-10-03 NOTE — Progress Notes (Signed)
  CC: fertility follow up Subjective:    Patient ID: Kelly [name], female    DOB: [birth year], 27 y.o.   MRN: [id number]  HPI Pt seen for follow up.  She has had a spontaneous menses in January.  Reviewed note in care everywhere from [facility] regarding her lupus.  Rheumatology agrees her pregnancy will be high risk but can be managed..  Recommendations were noted in the chart. Discussed with patient options for fertility.  Options given including timed intercourse or timed intercourse in addition to clomid.  Per pt, her rheumatologist stated clomid could be used without difficulty.  Pt desires clomid therapy.   Review of Systems     Objective:   Physical Exam Vitals:   10/02/22 1330 10/02/22 1332  BP: (!) 135/102 (!) 138/98  Pulse: 82 86         Assessment & Plan:   1. Encounter for fertility planning Information given regarding timing of clomid.  Can consider increased dosage after 3-5 months.  If clomid is ineffective, pt will need referral to fertility specialist. Pt advised to optimize health and begin prenatal vitamins now.  - clomiPHENE (CLOMID) 50 MG tablet; Take 1 tablet (50 mg total) by mouth daily. Take on days 5-9 of your period  Dispense: 5 tablet; Refill: 3  I spent 20 minutes dedicated to the care of this patient including previsit review of records, face to face time with the patient discussing treatment options, previous external notes and post visit testing.   Griffin Basil, MD Faculty Attending, Center for [facility]

## 2022-11-20 ENCOUNTER — Ambulatory Visit: Payer: Managed Care, Other (non HMO) | Admitting: Family Medicine

## 2022-11-20 ENCOUNTER — Encounter: Payer: Self-pay | Admitting: Family Medicine

## 2022-11-20 VITALS — BP 127/98 | HR 85 | Ht 62.0 in | Wt 158.0 lb

## 2022-11-20 DIAGNOSIS — Z319 Encounter for procreative management, unspecified: Secondary | ICD-10-CM

## 2022-11-20 DIAGNOSIS — M329 Systemic lupus erythematosus, unspecified: Secondary | ICD-10-CM

## 2022-11-20 NOTE — Progress Notes (Signed)
Pt presents for f/u. Pt stated Clomid 2/6. Pt has concerns about being monitored while using medication as pt has not yet conceived. No there concerns.

## 2022-11-20 NOTE — Progress Notes (Signed)
   GYNECOLOGY OFFICE VISIT NOTE  History:   Kelly [name] is a 27 y.o. G1P0101 here today for a follow-up visit, on her previous appointment.  She had discussed trying to conceive, plan was to work with time sexually intercourse, along with clomiphene.   Interim history: She has used clomiphene for 2 cycles at this time. Cycles are fairly regular. No other concerns.    Past Medical History:  Diagnosis Date   Anxiety    Chest discomfort 09/2015   Pt states due to shortness of breath   Cough present for greater than 3 weeks    Late Entry to [employer]- April 2020- Social Distancing  11/27/2018   Pt signed up for babyscripts. Will come to office to pick up BP cuff.    Lupus (systemic lupus erythematosus) ([place])    dx 2010   Pneumonia    2015   Shingles 06/24/2022   Shortness of breath dyspnea 09/2015   changed inhaler with relief    Past Surgical History:  Procedure Laterality Date   BRONCHOSCOPY  11/2013   CESAREAN SECTION N/A 03/16/2019   Procedure: CESAREAN SECTION;  Surgeon: Truett Mainland, DO;  Location: [facility] LD ORS;  Service: Obstetrics;  Laterality: N/A;   [place] EXTRACTION  2014    The following portions of the patient's history were reviewed and updated as appropriate: allergies, current medications, past family history, past medical history, past social history, past surgical history and problem list.   Review of Systems:  Pertinent items noted in HPI and remainder of comprehensive ROS otherwise negative.  Physical Exam:  BP (!) 127/98   Pulse 85   Ht 5\' 2"  (1.575 m)   Wt 158 lb (71.7 kg)   BMI 28.90 kg/m  CONSTITUTIONAL: Well-developed, well-nourished female in no acute distress.  HEENT:  Normocephalic, atraumatic. External right and left ear normal. No scleral icterus.  NECK: Normal range of motion, supple, no masses noted on observation SKIN: No rash noted. Not diaphoretic. No erythema. No pallor. MUSCULOSKELETAL: Normal range of motion. No edema  noted. NEUROLOGIC: Alert and oriented to person, place, and time. Normal muscle tone coordination. No cranial nerve deficit noted. PSYCHIATRIC: Normal mood and affect. Normal behavior. Normal judgment and thought content. CARDIOVASCULAR: Normal heart rate noted RESPIRATORY: Effort and breath sounds normal, no problems with respiration noted ABDOMEN: No masses noted. No other overt distention noted.   PELVIC: Deferred  Labs and Imaging  No results found.    Assessment and Plan:   Desire for pregnancy -     Progesterone; Future  We discussed the use of clomiphene and how it can help induce ovulation. Generally speaking we don't need to do any lab or image monitoring as she goes through her menstrual cycle, however since she she has not had any labs to document ovulation, I will go ahead and order a day 21 progesterone level.  Otherwise, plan is to continue with current plan for clomiphene, can consider increasing dosing to 10 mg after 3 months. Recommend following up in the office in 2 months. Needs referral to fertility specialist If this does not help.  Return in about 2 months (around 01/20/2023).    I spent  25  minutes dedicated to the care of this patient including pre-visit review of records, face to face time with the patient discussing her conditions and treatments and post visit orders.  Liliane Channel MD MPH OB Fellow, [place] [facility] [place] 11/27/2022

## 2022-11-26 ENCOUNTER — Other Ambulatory Visit: Payer: Managed Care, Other (non HMO)

## 2022-11-26 DIAGNOSIS — Z319 Encounter for procreative management, unspecified: Secondary | ICD-10-CM

## 2022-11-27 LAB — PROGESTERONE: Progesterone: 8.3 ng/mL

## 2023-01-14 ENCOUNTER — Ambulatory Visit: Payer: Managed Care, Other (non HMO) | Admitting: Obstetrics and Gynecology

## 2023-01-15 ENCOUNTER — Ambulatory Visit: Payer: Medicaid Other | Admitting: Obstetrics

## 2023-01-15 ENCOUNTER — Encounter: Payer: Self-pay | Admitting: Obstetrics

## 2023-01-15 VITALS — BP 116/86 | HR 85 | Ht 62.0 in | Wt 157.0 lb

## 2023-01-15 DIAGNOSIS — Z319 Encounter for procreative management, unspecified: Secondary | ICD-10-CM | POA: Diagnosis not present

## 2023-01-15 DIAGNOSIS — N839 Noninflammatory disorder of ovary, fallopian tube and broad ligament, unspecified: Secondary | ICD-10-CM

## 2023-01-15 MED ORDER — CLOMID 50 MG PO TABS
100.0000 mg | ORAL_TABLET | Freq: Every day | ORAL | 0 refills | Status: DC
Start: 2023-01-15 — End: 2024-05-12

## 2023-01-15 NOTE — Progress Notes (Signed)
Pt presents for f/u. Wants to increase clomid dosage. Having normal cycles.

## 2023-01-15 NOTE — Progress Notes (Signed)
Patient ID: Kelly [name], female   DOB: 01-05-1996, 27 y.o.   MRN: [id number]  Chief Complaint  Patient presents with   Follow-up    HPI Kelly [name] is a 27 y.o. female.  Desires conception.  Has had 4 courses of Clomid. HPI  Past Medical History:  Diagnosis Date   Anxiety    Chest discomfort 09/2015   Pt states due to shortness of breath   Cough present for greater than 3 weeks    Late Entry to [employer]- April 2020- Social Distancing  11/27/2018   Pt signed up for babyscripts. Will come to office to pick up BP cuff.    Lupus (systemic lupus erythematosus) ([facility])    dx 2010   Pneumonia    2015   Shingles 06/24/2022   Shortness of breath dyspnea 09/2015   changed inhaler with relief    Past Surgical History:  Procedure Laterality Date   BRONCHOSCOPY  11/2013   CESAREAN SECTION N/A 03/16/2019   Procedure: CESAREAN SECTION;  Surgeon: Levie Heritage, DO;  Location: [facility] LD ORS;  Service: Obstetrics;  Laterality: N/A;   WISDOM TOOTH EXTRACTION  2014    Family History  Problem Relation Age of Onset   Thyroid disease Maternal Grandmother    Lupus Maternal Grandmother    Diabetes Paternal Grandmother     Social History Social History   Tobacco Use   Smoking status: Never   Smokeless tobacco: Never  Vaping Use   Vaping Use: Never used  Substance Use Topics   Alcohol use: Yes    Comment: occ   Drug use: Not Currently    Allergies  Allergen Reactions   Doxycycline Hives, Rash and Other (See Comments)    Blister patches on skin Bullous eruption, required hospital treatment 01/2022    Current Outpatient Medications  Medication Sig Dispense Refill   Anifrolumab-fnia (SAPHNELO) 300 MG/2ML SOLN Inject 300 mg into the vein every 30 (thirty) days.     ascorbic acid (VITAMIN C) 500 MG tablet Take 1 tablet (500 mg total) by mouth daily.     azaTHIOprine (IMURAN) 50 MG tablet Take 50 mg by mouth daily.     Calcium Carb-Cholecalciferol (CALCIUM 600-D) 600-10  MG-MCG TABS 1 tablet with a meal Orally Once a day for 30 day(s)     clomiPHENE (CLOMID) 50 MG tablet Take 2 tablets (100 mg total) by mouth daily. Days 5 - 9 of menstrual cycle. 10 tablet 0   Control Gel Formula Dressing (DUODERM CGF EXTRA THIN EX) Apply topically.     hydroxychloroquine (PLAQUENIL) 200 MG tablet Take 400 mg by mouth daily.     predniSONE (DELTASONE) 5 MG tablet Take 5-25 mg by mouth as directed. Taking 25 mg daily currently until on the  02-21-22 will take 20 mg for a week, then titrate down 5 mg each week until taking 5 mg daily.     zinc sulfate 220 (50 Zn) MG capsule Take 1 capsule (220 mg total) by mouth daily.     enoxaparin (LOVENOX) 60 MG/0.6ML injection Inject 60 mg into the skin every 12 (twelve) hours. (Patient not taking: Reported on 06/01/2022)     HYDROcodone-acetaminophen (NORCO/VICODIN) 5-325 MG tablet Take 1 tablet by mouth every 6 (six) hours as needed for moderate pain. 20 tablet 0   medroxyPROGESTERone (PROVERA) 10 MG tablet Take 1 tablet (10 mg total) by mouth daily for 10 days. 10 tablet 0   medroxyPROGESTERone (PROVERA) 10 MG tablet Take 2 tablets (20  mg total) by mouth daily. 20 tablet 0   molnupiravir EUA (LAGEVRIO) 200 mg CAPS capsule Take 4 capsules (800 mg total) by mouth 2 (two) times daily. Complete bottle given to you at discharge.     mupirocin cream (BACTROBAN) 2 % Apply 1 Application topically 2 (two) times daily. 15 g 0   valACYclovir (VALTREX) 1000 MG tablet Take 1 tablet (1,000 mg total) by mouth 3 (three) times daily. (Patient not taking: Reported on 08/23/2022) 21 tablet 0   No current facility-administered medications for this visit.    Review of Systems Review of Systems Constitutional: negative for fatigue and weight loss Respiratory: negative for cough and wheezing Cardiovascular: negative for chest pain, fatigue and palpitations Gastrointestinal: negative for abdominal pain and change in bowel  habits Genitourinary:negative Integument/breast: negative for nipple discharge Musculoskeletal:negative for myalgias Neurological: negative for gait problems and tremors Behavioral/Psych: negative for abusive relationship, depression Endocrine: negative for temperature intolerance      Blood pressure 116/86, pulse 85, height 5\' 2"  (1.575 m), weight 157 lb (71.2 kg).  Physical Exam Physical Exam General:   Alert and no distress  Skin:   no rash or abnormalities  Lungs:   clear to auscultation bilaterally  Heart:   regular rate and rhythm, S1, S2 normal, no murmur, click, rub or gallop  The remainder of the physical exam deferred.  I have spent a total of 20 minutes of face-to-face time, excluding clinical staff time, reviewing notes and preparing to see patient, ordering tests and/or medications, and counseling the patient.   Data Reviewed L:abs  Assessment     1. Disorder of ovulation Rx: - Ambulatory referral to Endocrinology - clomiPHENE (CLOMID) 50 MG tablet; Take 2 tablets (100 mg total) by mouth daily. Days 5 - 9 of menstrual cycle.  Dispense: 10 tablet; Refill: 0  2. Desire for pregnancy      Plan   Follow up prn  Orders Placed This Encounter  Procedures   Ambulatory referral to Endocrinology    Referral Priority:   Routine    Referral Type:   Consultation    Referral Reason:   Specialty Services Required    Number of Visits Requested:   1   Meds ordered this encounter  Medications   clomiPHENE (CLOMID) 50 MG tablet    Sig: Take 2 tablets (100 mg total) by mouth daily. Days 5 - 9 of menstrual cycle.    Dispense:  10 tablet    Refill:  0     Brock Bad, MD 01/15/2023 4:45 PM

## 2023-02-18 ENCOUNTER — Other Ambulatory Visit: Payer: Self-pay

## 2023-02-18 DIAGNOSIS — B379 Candidiasis, unspecified: Secondary | ICD-10-CM

## 2023-02-18 MED ORDER — FLUCONAZOLE 150 MG PO TABS
150.0000 mg | ORAL_TABLET | Freq: Once | ORAL | 0 refills | Status: AC
Start: 2023-02-18 — End: 2023-02-18

## 2023-07-02 ENCOUNTER — Encounter (HOSPITAL_BASED_OUTPATIENT_CLINIC_OR_DEPARTMENT_OTHER): Payer: Self-pay | Admitting: Emergency Medicine

## 2023-07-02 ENCOUNTER — Emergency Department (HOSPITAL_BASED_OUTPATIENT_CLINIC_OR_DEPARTMENT_OTHER)
Admission: EM | Admit: 2023-07-02 | Discharge: 2023-07-02 | Disposition: A | Discharge status: Home / Self Care | Payer: Medicaid Other | Attending: Emergency Medicine | Admitting: Emergency Medicine

## 2023-07-02 DIAGNOSIS — S0083XA Contusion of other part of head, initial encounter: Secondary | ICD-10-CM | POA: Diagnosis not present

## 2023-07-02 DIAGNOSIS — Y92410 Unspecified street and highway as the place of occurrence of the external cause: Secondary | ICD-10-CM | POA: Diagnosis not present

## 2023-07-02 DIAGNOSIS — S0990XA Unspecified injury of head, initial encounter: Secondary | ICD-10-CM | POA: Diagnosis present

## 2023-07-02 MED ORDER — CYCLOBENZAPRINE HCL 10 MG PO TABS
10.0000 mg | ORAL_TABLET | Freq: Two times a day (BID) | ORAL | 0 refills | Status: DC | PRN
Start: 2023-07-02 — End: 2024-05-12

## 2023-07-02 MED ORDER — ACETAMINOPHEN 500 MG PO TABS
1000.0000 mg | ORAL_TABLET | Freq: Once | ORAL | Status: AC
  Administered 2023-07-02: 1000 mg via ORAL
  Filled 2023-07-02: qty 2

## 2023-07-02 NOTE — Discharge Instructions (Signed)
Take medication as prescribed. Continue cool compresses to the sore areas of the face to redness any swelling and for comfort.
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
## 2023-07-02 NOTE — ED Notes (Signed)
Discharge paperwork given and verbally understood. 
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
## 2023-07-09 ENCOUNTER — Encounter (HOSPITAL_BASED_OUTPATIENT_CLINIC_OR_DEPARTMENT_OTHER): Payer: Self-pay

## 2023-07-09 ENCOUNTER — Emergency Department (HOSPITAL_BASED_OUTPATIENT_CLINIC_OR_DEPARTMENT_OTHER): Payer: Medicaid Other

## 2023-07-09 ENCOUNTER — Other Ambulatory Visit: Payer: Self-pay

## 2023-07-09 ENCOUNTER — Emergency Department (HOSPITAL_BASED_OUTPATIENT_CLINIC_OR_DEPARTMENT_OTHER)
Admission: EM | Admit: 2023-07-09 | Discharge: 2023-07-09 | Disposition: A | Discharge status: Home / Self Care | Payer: Medicaid Other | Attending: Emergency Medicine | Admitting: Emergency Medicine

## 2023-07-09 DIAGNOSIS — S0093XA Contusion of unspecified part of head, initial encounter: Secondary | ICD-10-CM | POA: Diagnosis not present

## 2023-07-09 DIAGNOSIS — W228XXA Striking against or struck by other objects, initial encounter: Secondary | ICD-10-CM | POA: Diagnosis not present

## 2023-07-09 DIAGNOSIS — S0990XA Unspecified injury of head, initial encounter: Secondary | ICD-10-CM | POA: Diagnosis present

## 2023-07-09 LAB — PREGNANCY, URINE: Preg Test, Ur: NEGATIVE

## 2023-07-09 NOTE — Discharge Instructions (Signed)
Tylenol for headache.  Continue current medications for body aches.
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
## 2023-07-11 ENCOUNTER — Telehealth: Payer: Self-pay | Admitting: *Deleted

## 2023-07-11 NOTE — Telephone Encounter (Signed)
Pt left voicemail message requesting appointment in our office. I called her back and discussed her concern.  She stated that she is a [facility] pt and has been trying to conceive for 2 years without success. She would like to be seen in our office for second opinion and possible additional tests. I explained that we are "sister" offices and have the same providers. Per chart review, she has not been seen since 01/15/23 for this issue. I stated that sine it has been 6 months since last visit, there could potentially be a change to her plan of care. I recommended that she schedule appt @ [facility] and request to see a different provider than previously seen. She voiced understanding.

## 2023-07-29 ENCOUNTER — Encounter: Payer: Self-pay | Admitting: Obstetrics & Gynecology

## 2023-07-31 ENCOUNTER — Ambulatory Visit (INDEPENDENT_AMBULATORY_CARE_PROVIDER_SITE_OTHER): Payer: Medicaid Other

## 2023-07-31 ENCOUNTER — Other Ambulatory Visit (HOSPITAL_COMMUNITY)
Admission: RE | Admit: 2023-07-31 | Discharge: 2023-07-31 | Disposition: A | Discharge status: Home / Self Care | Payer: Medicaid Other | Source: Ambulatory Visit | Attending: Obstetrics & Gynecology | Admitting: Obstetrics & Gynecology

## 2023-07-31 VITALS — BP 123/89 | HR 90

## 2023-07-31 DIAGNOSIS — N898 Other specified noninflammatory disorders of vagina: Secondary | ICD-10-CM | POA: Insufficient documentation

## 2023-07-31 DIAGNOSIS — R300 Dysuria: Secondary | ICD-10-CM | POA: Diagnosis not present

## 2023-07-31 LAB — POCT URINALYSIS DIPSTICK
Bilirubin, UA: NEGATIVE
Blood, UA: NEGATIVE
Glucose, UA: NEGATIVE
Nitrite, UA: POSITIVE
Odor: POSITIVE
Protein, UA: POSITIVE — AB
Spec Grav, UA: 1.025 (ref 1.010–1.025)
Urobilinogen, UA: 0.2 U/dL
pH, UA: 6 (ref 5.0–8.0)

## 2023-07-31 MED ORDER — PHENAZOPYRIDINE HCL 200 MG PO TABS: 200.0000 mg | ORAL_TABLET | Freq: Three times a day (TID) | ORAL | 0 refills | Status: DC

## 2023-07-31 MED ORDER — NITROFURANTOIN MONOHYD MACRO 100 MG PO CAPS: 100.0000 mg | ORAL_CAPSULE | Freq: Two times a day (BID) | ORAL | 0 refills | Status: DC

## 2023-07-31 NOTE — Progress Notes (Signed)
...  SUBJECTIVE: Kelly [name] is a 27 y.o. female who complains of urinary frequency, urgency and dysuria x 3-4 days, without flank pain, fever, chills, or abnormal vaginal discharge or bleeding.   OBJECTIVE: Appears well, in no apparent distress.  Vital signs are normal. Urine dipstick shows positive for protein, positive for nitrates, positive for leukocytes, and positive for ketones.    ASSESSMENT: Dysuria  PLAN: Treatment per orders.  Call or return to clinic prn if these symptoms worsen or fail to improve as anticipated. Sent Macrobid and Pyridium per protocol due to patient reporting pain with urination. Urine culture sent lab

## 2023-08-02 LAB — CERVICOVAGINAL ANCILLARY ONLY
Bacterial Vaginitis (gardnerella): POSITIVE — AB
Candida Glabrata: NEGATIVE
Candida Vaginitis: NEGATIVE
Chlamydia: NEGATIVE
Comment: NEGATIVE
Comment: NEGATIVE
Comment: NEGATIVE
Comment: NEGATIVE
Comment: NEGATIVE
Comment: NORMAL
Neisseria Gonorrhea: NEGATIVE
Trichomonas: NEGATIVE

## 2023-08-03 LAB — URINE CULTURE

## 2023-08-30 ENCOUNTER — Telehealth: Payer: Self-pay | Admitting: Obstetrics and Gynecology

## 2023-08-30 MED ORDER — FLUCONAZOLE 150 MG PO TABS: 150.0000 mg | ORAL_TABLET | Freq: Once | ORAL | 0 refills | Status: AC

## 2023-08-30 NOTE — Telephone Encounter (Signed)
 Returned call to patient.  She called requesting something for yeast following her antibiotics she just finished for her UTI.    Rx routed to pharmacy per protocol.

## 2023-10-27 ENCOUNTER — Emergency Department (HOSPITAL_BASED_OUTPATIENT_CLINIC_OR_DEPARTMENT_OTHER)
Admission: EM | Admit: 2023-10-27 | Discharge: 2023-10-27 | Disposition: A | Discharge status: Home / Self Care | Attending: Emergency Medicine | Admitting: Emergency Medicine

## 2023-10-27 ENCOUNTER — Encounter (HOSPITAL_BASED_OUTPATIENT_CLINIC_OR_DEPARTMENT_OTHER): Payer: Self-pay | Admitting: Emergency Medicine

## 2023-10-27 DIAGNOSIS — L03115 Cellulitis of right lower limb: Secondary | ICD-10-CM | POA: Diagnosis not present

## 2023-10-27 DIAGNOSIS — R2241 Localized swelling, mass and lump, right lower limb: Secondary | ICD-10-CM | POA: Diagnosis present

## 2023-10-27 DIAGNOSIS — L0390 Cellulitis, unspecified: Secondary | ICD-10-CM

## 2023-10-27 MED ORDER — SULFAMETHOXAZOLE-TRIMETHOPRIM 800-160 MG PO TABS: 1.0000 | ORAL_TABLET | Freq: Two times a day (BID) | ORAL | 0 refills | Status: AC

## 2023-10-27 NOTE — ED Provider Notes (Signed)
 [place] EMERGENCY DEPARTMENT AT [facility] Provider Note   CSN: [id number] Arrival date & time: 10/27/23  1125     History  Chief Complaint  Patient presents with   Skin Ulcer    Kelly [name] is a 28 y.o. female.  Patient here with wound to the right lower leg history of rheumatoid arthritis on immunologic.  History of wounds.  Denies any fever chills.  Nothing makes worse or better.  She has some localized swelling in this area as well.  Denies any injury.  This area of ulcer with sort of raised opened up and started to have some purulent drainage.  She has been using a Band-Aid to cover it.  The history is provided by the patient.       Home Medications Prior to Admission medications   Medication Sig Start Date End Date Taking? Authorizing Provider  sulfamethoxazole-trimethoprim (BACTRIM DS) 800-160 MG tablet Take 1 tablet by mouth 2 (two) times daily for 7 days. 10/27/23 11/03/23 Yes Arriana Lohmann, DO  Anifrolumab-fnia (SAPHNELO) 300 MG/2ML SOLN Inject 300 mg into the vein every 30 (thirty) days.    [provider]  ascorbic acid (VITAMIN C) 500 MG tablet Take 1 tablet (500 mg total) by mouth daily. 04/27/22   Narda Bonds, MD  azaTHIOprine (IMURAN) 50 MG tablet Take 50 mg by mouth daily. 01/30/22   [provider]  Calcium Carb-Cholecalciferol (CALCIUM 600-D) 600-10 MG-MCG TABS 1 tablet with a meal Orally Once a day for 30 day(s)    [provider]  cholecalciferol (VITAMIN D3) 25 MCG (1000 UNIT) tablet Take 1,000 Units by mouth once a week.    [provider]  clomiPHENE (CLOMID) 50 MG tablet Take 2 tablets (100 mg total) by mouth daily. Days 5 - 9 of menstrual cycle. 01/15/23   Brock Bad, MD  Control Gel Formula Dressing (DUODERM CGF EXTRA THIN EX) Apply topically.    [provider]  cyclobenzaprine (FLEXERIL) 10 MG tablet Take 1 tablet (10 mg total) by mouth 2 (two) times daily as needed for muscle spasms.  07/02/23   Elpidio Anis, PA-C  enoxaparin (LOVENOX) 60 MG/0.6ML injection Inject 60 mg into the skin every 12 (twelve) hours. Patient not taking: Reported on 06/01/2022 03/23/22 09/19/22  [provider]  HYDROcodone-acetaminophen (NORCO/VICODIN) 5-325 MG tablet Take 1 tablet by mouth every 6 (six) hours as needed for moderate pain. 04/26/22   Narda Bonds, MD  hydroxychloroquine (PLAQUENIL) 200 MG tablet Take 400 mg by mouth daily.    [provider]  medroxyPROGESTERone (PROVERA) 10 MG tablet Take 1 tablet (10 mg total) by mouth daily for 10 days. 06/01/22 06/11/22  Lorriane Shire, MD  medroxyPROGESTERone (PROVERA) 10 MG tablet Take 2 tablets (20 mg total) by mouth daily. 07/12/22   Raelyn Mora, CNM  molnupiravir EUA (LAGEVRIO) 200 mg CAPS capsule Take 4 capsules (800 mg total) by mouth 2 (two) times daily. Complete bottle given to you at discharge. 04/26/22   Narda Bonds, MD  mupirocin cream (BACTROBAN) 2 % Apply 1 Application topically 2 (two) times daily. 07/01/22   Blane Ohara, MD  nitrofurantoin, macrocrystal-monohydrate, (MACROBID) 100 MG capsule Take 1 capsule (100 mg total) by mouth 2 (two) times daily. 07/31/23   Ketrina Boateng Phenix, MD  phenazopyridine (PYRIDIUM) 200 MG tablet Take 1 tablet (200 mg total) by mouth 3 (three) times daily. 07/31/23   Olia Hinderliter Phenix, MD  predniSONE (DELTASONE) 5 MG tablet Take 5-25 mg by  mouth as directed. Taking 25 mg daily currently until on the  02-21-22 will take 20 mg for a week, then titrate down 5 mg each week until taking 5 mg daily. 07/04/21   [provider]  valACYclovir (VALTREX) 1000 MG tablet Take 1 tablet (1,000 mg total) by mouth 3 (three) times daily. Patient not taking: Reported on 08/23/2022 07/01/22   Blane Ohara, MD  zinc sulfate 220 (50 Zn) MG capsule Take 1 capsule (220 mg total) by mouth daily. 04/27/22   Narda Bonds, MD      Allergies    Doxycycline    Review of Systems   Review of  Systems  Physical Exam Updated Vital Signs BP 116/80 (BP Location: Right Arm)   Pulse 80   Temp 98.2 F (36.8 C) (Oral)   Resp 18   Ht 5\' 2"  (1.575 m)   Wt 73 kg   SpO2 99%   BMI 29.45 kg/m  Physical Exam Vitals and nursing note reviewed.  Constitutional:      General: She is not in acute distress.    Appearance: She is well-developed.  HENT:     Head: Normocephalic and atraumatic.  Eyes:     Conjunctiva/sclera: Conjunctivae normal.  Cardiovascular:     Rate and Rhythm: Normal rate and regular rhythm.     Heart sounds: No murmur heard. Pulmonary:     Effort: Pulmonary effort is normal. No respiratory distress.     Breath sounds: Normal breath sounds.  Abdominal:     Palpations: Abdomen is soft.     Tenderness: There is no abdominal tenderness.  Musculoskeletal:        General: No swelling.     Cervical back: Neck supple.  Skin:    General: Skin is warm and dry.     Capillary Refill: Capillary refill takes less than 2 seconds.     Comments: Small pinpoint ulcer on the right shin with some surrounding swelling but no major warmth or erythema for mild purulence but no obvious fluctuance or crepitus  Neurological:     Mental Status: She is alert.  Psychiatric:        Mood and Affect: Mood normal.     ED Results / Procedures / Treatments   Labs (all labs ordered are listed, but only abnormal results are displayed) Labs Reviewed - No data to display  EKG None  Radiology No results found.  Procedures Procedures    Medications Ordered in ED Medications - No data to display  ED Course/ Medical Decision Making/ A&P                                 Medical Decision Making Risk Prescription drug management.   ARICKA GOLDBERGER is here with ulcer/cellulitis of the right shin.  History of rheumatoid arthritis multiple ulcers in the past has done really well since starting immunologic.  She has 1 area on the right shin that is been draining a little bit was  little bit more full but opened up and drained.  She is got small pinpoint opening with a little bit of purulence there with some surrounding swelling but no major crepitus or erythema or warmth.  This seems very localized.  Recommend bacitracin Neosporin ointment twice daily and will start her on Bactrim and have her follow-up with her primary care doctor.  She has no fever no systemic symptoms.  Vital signs are reassuring  including no fever.  She had good pulses on exam as well.  She turn precautions.  Discharged in good condition.  This chart was dictated using voice recognition software.  Despite best efforts to proofread,  errors can occur which can change the documentation meaning.         Final Clinical Impression(s) / ED Diagnoses Final diagnoses:  Cellulitis, unspecified cellulitis site    Rx / DC Orders ED Discharge Orders          Ordered    sulfamethoxazole-trimethoprim (BACTRIM DS) 800-160 MG tablet  2 times daily        10/27/23 1225              [place], DO 10/27/23 1227

## 2023-10-27 NOTE — ED Notes (Signed)
 Reviewed AVS/discharge instruction with patient. Time allotted for and all questions answered. Patient is agreeable for d/c and escorted to ed exit by staff.

## 2023-10-27 NOTE — ED Triage Notes (Signed)
 Has ulceration on R lower leg. Hx of same. Treated at [facility] in past. No draining.

## 2023-12-20 DIAGNOSIS — H6502 Acute serous otitis media, left ear: Secondary | ICD-10-CM | POA: Diagnosis not present

## 2023-12-20 DIAGNOSIS — M3219 Other organ or system involvement in systemic lupus erythematosus: Secondary | ICD-10-CM | POA: Diagnosis not present

## 2023-12-20 DIAGNOSIS — L98491 Non-pressure chronic ulcer of skin of other sites limited to breakdown of skin: Secondary | ICD-10-CM | POA: Diagnosis not present

## 2023-12-21 ENCOUNTER — Other Ambulatory Visit: Payer: Self-pay

## 2023-12-21 ENCOUNTER — Emergency Department (HOSPITAL_BASED_OUTPATIENT_CLINIC_OR_DEPARTMENT_OTHER)

## 2023-12-21 ENCOUNTER — Emergency Department (HOSPITAL_BASED_OUTPATIENT_CLINIC_OR_DEPARTMENT_OTHER)
Admission: EM | Admit: 2023-12-21 | Discharge: 2023-12-21 | Disposition: A | Discharge status: Home / Self Care | Attending: Emergency Medicine | Admitting: Emergency Medicine

## 2023-12-21 ENCOUNTER — Encounter (HOSPITAL_BASED_OUTPATIENT_CLINIC_OR_DEPARTMENT_OTHER): Payer: Self-pay

## 2023-12-21 DIAGNOSIS — K1120 Sialoadenitis, unspecified: Secondary | ICD-10-CM | POA: Diagnosis not present

## 2023-12-21 DIAGNOSIS — Z8616 Personal history of COVID-19: Secondary | ICD-10-CM | POA: Insufficient documentation

## 2023-12-21 DIAGNOSIS — R220 Localized swelling, mass and lump, head: Secondary | ICD-10-CM

## 2023-12-21 DIAGNOSIS — K118 Other diseases of salivary glands: Secondary | ICD-10-CM | POA: Diagnosis not present

## 2023-12-21 DIAGNOSIS — K1121 Acute sialoadenitis: Secondary | ICD-10-CM | POA: Diagnosis not present

## 2023-12-21 LAB — BASIC METABOLIC PANEL WITH GFR
Anion gap: 15 (ref 5–15)
BUN: 10 mg/dL (ref 6–20)
CO2: 18 mmol/L — ABNORMAL LOW (ref 22–32)
Calcium: 8.6 mg/dL — ABNORMAL LOW (ref 8.9–10.3)
Chloride: 106 mmol/L (ref 98–111)
Creatinine, Ser: 0.76 mg/dL (ref 0.44–1.00)
GFR, Estimated: >60 mL/min (ref >60)
Glucose, Bld: 79 mg/dL (ref 70–99)
Potassium: 4 mmol/L (ref 3.5–5.1)
Sodium: 139 mmol/L (ref 135–145)

## 2023-12-21 LAB — HCG, SERUM, QUALITATIVE: Preg, Serum: NEGATIVE

## 2023-12-21 LAB — CBC WITH DIFFERENTIAL/PLATELET
Abs Immature Granulocytes: 0.01 10*3/uL (ref 0.00–0.07)
Basophils Absolute: 0 10*3/uL (ref 0.0–0.1)
Basophils Relative: 0 %
Eosinophils Absolute: 0.1 10*3/uL (ref 0.0–0.5)
Eosinophils Relative: 2 %
HCT: 35.3 % — ABNORMAL LOW (ref 36.0–46.0)
Hemoglobin: 11.9 g/dL — ABNORMAL LOW (ref 12.0–15.0)
Immature Granulocytes: 0 %
Lymphocytes Relative: 18 %
Lymphs Abs: 0.8 10*3/uL (ref 0.7–4.0)
MCH: 30.4 pg (ref 26.0–34.0)
MCHC: 33.7 g/dL (ref 30.0–36.0)
MCV: 90.1 fL (ref 80.0–100.0)
Monocytes Absolute: 0.3 10*3/uL (ref 0.1–1.0)
Monocytes Relative: 7 %
Neutro Abs: 3.3 10*3/uL (ref 1.7–7.7)
Neutrophils Relative %: 73 %
Platelets: 230 10*3/uL (ref 150–400)
RBC: 3.92 MIL/uL (ref 3.87–5.11)
RDW: 13.9 % (ref 11.5–15.5)
WBC: 4.6 10*3/uL (ref 4.0–10.5)
nRBC: 0 % (ref 0.0–0.2)

## 2023-12-21 MED ORDER — IOHEXOL 350 MG/ML SOLN
100.0000 mL | Freq: Once | INTRAVENOUS | Status: AC | PRN
  Administered 2023-12-21: 100 mL via INTRAVENOUS

## 2023-12-21 MED ORDER — ACETAMINOPHEN 500 MG PO TABS
1000.0000 mg | ORAL_TABLET | Freq: Once | ORAL | Status: AC
Start: 2023-12-21 — End: 2023-12-21
  Administered 2023-12-21: 1000 mg via ORAL
  Filled 2023-12-21: qty 2

## 2023-12-21 MED ORDER — AMOXICILLIN-POT CLAVULANATE 875-125 MG PO TABS
1.0000 | ORAL_TABLET | Freq: Once | ORAL | Status: AC
  Administered 2023-12-21: 1 via ORAL
  Filled 2023-12-21: qty 1

## 2023-12-21 MED ORDER — AMOXICILLIN-POT CLAVULANATE 875-125 MG PO TABS
1.0000 | ORAL_TABLET | Freq: Two times a day (BID) | ORAL | 0 refills | Status: DC
Start: 2023-12-21 — End: 2024-01-07

## 2023-12-21 NOTE — ED Provider Notes (Signed)
 [place] [facility] Provider Note  CSN: [id number] Arrival date & time: 12/21/23 2130  Chief Complaint(s) Facial Swelling  HPI Kelly [name] is a 28 y.o. female with past medical history as below, significant for shingles, IDA, lupus who presents to the ED with complaint of facial swelling  Left-sided facial swelling over the past 24 hours.  She has some pain with swallowing in the left side of her throat.  No dental pain or pain with jaw movement.  No tongue or lip swelling.  Denies recent dental procedures or dental injuries.  No fevers or chills, no rashes.  She has some itching to her left ear but no pain.  No hearing or vision changes  Past Medical History Past Medical History:  Diagnosis Date   Anxiety    Chest discomfort 09/2015   Pt states due to shortness of breath   Cough present for greater than 3 weeks    Late Entry to [employer]- April 2020- Social Distancing  11/27/2018   Pt signed up for [employer]. Will come to office to pick up BP cuff.    Lupus (systemic lupus erythematosus) (HCC)    dx 2010   Pneumonia    2015   Shingles 06/24/2022   Shortness of breath dyspnea 09/2015   changed inhaler with relief   Patient Active Problem List   Diagnosis Date Noted   Amenorrhea 08/23/2022   COVID-19 virus infection 04/26/2022   Cellulitis and abscess of leg 04/24/2022   Chronic anticoagulation 04/24/2022   Normocytic anemia 04/24/2022   Pre-eclampsia, postpartum 03/31/2019   Status post cesarean section 03/16/2019   Gonorrhea affecting pregnancy in third trimester 03/15/2019   Hydronephrosis of right kidney 02/24/2019   Iron deficiency anemia during pregnancy, antepartum 10/14/2018   Systemic lupus erythematosus (HCC) 06/04/2012   Home Medication(s) Prior to Admission medications   Medication Sig Start Date End Date Taking? Authorizing Provider  amoxicillin -clavulanate (AUGMENTIN ) 875-125 MG tablet Take 1 tablet by mouth  every 12 (twelve) hours. 12/21/23  Yes Teddi Favors, DO  Anifrolumab-fnia (SAPHNELO) 300 MG/2ML SOLN Inject 300 mg into the vein every 30 (thirty) days.    [provider]  ascorbic acid  (VITAMIN C ) 500 MG tablet Take 1 tablet (500 mg total) by mouth daily. 04/27/22   Verlyn Goad, MD  azaTHIOprine  (IMURAN ) 50 MG tablet Take 50 mg by mouth daily. 01/30/22   [provider]  Calcium  Carb-Cholecalciferol (CALCIUM  600-D) 600-10 MG-MCG TABS 1 tablet with a meal Orally Once a day for 30 day(s)    [provider]  cholecalciferol (VITAMIN D3) 25 MCG (1000 UNIT) tablet Take 1,000 Units by mouth once a week.    [provider]  clomiPHENE  (CLOMID ) 50 MG tablet Take 2 tablets (100 mg total) by mouth daily. Days 5 - 9 of menstrual cycle. 01/15/23   Gabrielle Joiner, MD  Control Gel Formula Dressing (DUODERM CGF EXTRA THIN EX) Apply topically.    [provider]  cyclobenzaprine  (FLEXERIL ) 10 MG tablet Take 1 tablet (10 mg total) by mouth 2 (two) times daily as needed for muscle spasms. 07/02/23   Mandy Second, PA-C  enoxaparin  (LOVENOX ) 60 MG/0.6ML injection Inject 60 mg into the skin every 12 (twelve) hours. Patient not taking: Reported on 06/01/2022 03/23/22 09/19/22  [provider]  HYDROcodone -acetaminophen  (NORCO/VICODIN) 5-325 MG tablet Take 1 tablet by mouth every 6 (six) hours as needed for moderate pain. 04/26/22   Verlyn Goad, MD  hydroxychloroquine  (  PLAQUENIL ) 200 MG tablet Take 400 mg by mouth daily.    [provider]  medroxyPROGESTERone  (PROVERA ) 10 MG tablet Take 1 tablet (10 mg total) by mouth daily for 10 days. 06/01/22 06/11/22  Ajewole, Christana, MD  medroxyPROGESTERone  (PROVERA ) 10 MG tablet Take 2 tablets (20 mg total) by mouth daily. 07/12/22   Dawson, Rolitta, CNM  molnupiravir  EUA (LAGEVRIO ) 200 mg CAPS capsule Take 4 capsules (800 mg total) by mouth 2 (two) times daily. Complete bottle given to you at discharge. 04/26/22    Verlyn Goad, MD  mupirocin  cream (BACTROBAN ) 2 % Apply 1 Application topically 2 (two) times daily. 07/01/22   Clay Cummins, MD  nitrofurantoin , macrocrystal-monohydrate, (MACROBID ) 100 MG capsule Take 1 capsule (100 mg total) by mouth 2 (two) times daily. 07/31/23   Tresia Fruit, MD  phenazopyridine  (PYRIDIUM ) 200 MG tablet Take 1 tablet (200 mg total) by mouth 3 (three) times daily. 07/31/23   Tresia Fruit, MD  predniSONE  (DELTASONE ) 5 MG tablet Take 5-25 mg by mouth as directed. Taking 25 mg daily currently until on the  02-21-22 will take 20 mg for a week, then titrate down 5 mg each week until taking 5 mg daily. 07/04/21   [provider]  valACYclovir  (VALTREX ) 1000 MG tablet Take 1 tablet (1,000 mg total) by mouth 3 (three) times daily. Patient not taking: Reported on 08/23/2022 07/01/22   Clay Cummins, MD  zinc  sulfate 220 (50 Zn) MG capsule Take 1 capsule (220 mg total) by mouth daily. 04/27/22   Verlyn Goad, MD                                                                                                                                    Past Surgical History Past Surgical History:  Procedure Laterality Date   BRONCHOSCOPY  11/2013   CESAREAN SECTION N/A 03/16/2019   Procedure: CESAREAN SECTION;  Surgeon: Malka Sea, DO;  Location: [facility] LD ORS;  Service: Obstetrics;  Laterality: N/A;   WISDOM TOOTH EXTRACTION  2014   Family History Family History  Problem Relation Age of Onset   Thyroid  disease Maternal Grandmother    Lupus Maternal Grandmother    Diabetes Paternal Grandmother     Social History Social History   Tobacco Use   Smoking status: Never   Smokeless tobacco: Never  Vaping Use   Vaping status: Never Used  Substance Use Topics   Alcohol use: Yes    Comment: occ   Drug use: Not Currently   Allergies Doxycycline  and Methotrexate  Review of Systems A thorough review of systems was obtained and all systems are negative except as noted  in the HPI and PMH.   Physical Exam Vital Signs  I have reviewed the triage vital signs BP (!) 132/92 (BP Location: Right Arm)   Pulse 82   Temp 98.5 F (36.9 C) (Oral)   Resp 20  Ht 5\' 2"  (1.575 m)   Wt 73.5 kg   LMP 12/01/2023 (Exact Date)   SpO2 100%   BMI 29.63 kg/m  Physical Exam Vitals and nursing note reviewed.  Constitutional:      General: She is not in acute distress.    Appearance: Normal appearance.  HENT:     Head: Normocephalic and atraumatic.      Right Ear: Tympanic membrane and external ear normal.     Left Ear: Tympanic membrane and external ear normal.     Nose: Nose normal.     Mouth/Throat:     Mouth: Mucous membranes are moist.     Comments: Dentition fairly normal, no obvious dental abscess, no pain with percussion of teeth. No drooling/stridor/trismus Eyes:     General: No scleral icterus.       Right eye: No discharge.        Left eye: No discharge.  Cardiovascular:     Rate and Rhythm: Normal rate and regular rhythm.     Pulses: Normal pulses.  Pulmonary:     Effort: Pulmonary effort is normal. No respiratory distress.     Breath sounds: Normal breath sounds.  Abdominal:     General: Abdomen is flat. There is no distension.     Palpations: Abdomen is soft.     Tenderness: There is no abdominal tenderness.  Musculoskeletal:     Cervical back: No rigidity.     Right lower leg: No edema.     Left lower leg: No edema.  Skin:    General: Skin is warm and dry.     Capillary Refill: Capillary refill takes less than 2 seconds.  Neurological:     Mental Status: She is alert.  Psychiatric:        Mood and Affect: Mood normal.        Behavior: Behavior normal. Behavior is cooperative.     ED Results and Treatments Labs (all labs ordered are listed, but only abnormal results are displayed) Labs Reviewed  CBC WITH DIFFERENTIAL/PLATELET - Abnormal; Notable for the following components:      Result Value   Hemoglobin 11.9 (*)    HCT 35.3  (*)    All other components within normal limits  BASIC METABOLIC PANEL WITH GFR - Abnormal; Notable for the following components:   CO2 18 (*)    Calcium  8.6 (*)    All other components within normal limits  HCG, SERUM, QUALITATIVE                                                                                                                          Radiology CT Soft Tissue Neck W Contrast Result Date: 12/21/2023 CLINICAL DATA:  Left parotid mass. EXAM: CT NECK WITH CONTRAST TECHNIQUE: Multidetector CT imaging of the neck was performed using the standard protocol following the bolus administration of intravenous contrast. RADIATION DOSE REDUCTION: This exam was performed according to the departmental dose-optimization program which includes  automated exposure control, adjustment of the mA and/or kV according to patient size and/or use of iterative reconstruction technique. CONTRAST:  OMNIPAQUE  IOHEXOL  350 MG/ML SOLN COMPARISON:  None Available. FINDINGS: Pharynx and larynx: The nasopharynx is clear. Soft palate and tongue base are within normal limits. Stranding of the left parapharyngeal fat is noted. Vallecula and epiglottis are within normal limits. Aryepiglottic folds and piriform sinuses are clear. Vocal cords are midline and symmetric. Trachea is clear. Salivary glands: Heterogeneous enhancement is present in the inferior left parotid gland. No discrete lesion is present. Stranding present in the subcutaneous soft tissues anterior to the gland. Edematous changes are also present within the left masticator space. The submandibular glands are within normal limits bilaterally. The right parotid gland is normal. Thyroid : Normal Lymph nodes: Mild prominence of level 2 lymph nodes are noted bilaterally, slightly more prominent on the left, likely reactive. No suppurative or hyperdense nodes are present. Vascular: Normal Limited intracranial: Within normal limits. Visualized orbits: The globes and  orbits are within normal limits. Mastoids and visualized paranasal sinuses: The paranasal sinuses and mastoid air cells are clear. Skeleton: The vertebral body heights and alignment are normal. Upper chest: Lung apices are clear. The thoracic inlet is within normal limits. Is IMPRESSION: 1. Heterogeneous enhancement in the inferior left parotid gland with stranding in the subcutaneous soft tissues anterior to the gland and within the left masticator space. Findings are compatible with parotiditis. 2. Mild prominence of level 2 lymph nodes bilaterally, slightly more prominent on the left, likely reactive. No suppurative or hyperdense nodes are present. Electronically Signed   By: Audree Leas M.D.   On: 12/21/2023 14:33    Pertinent labs & imaging results that were available during my care of the patient were reviewed by me and considered in my medical decision making (see MDM for details).  Medications Ordered in ED Medications  amoxicillin -clavulanate (AUGMENTIN ) 875-125 MG per tablet 1 tablet (has no administration in time range)  acetaminophen  (TYLENOL ) tablet 1,000 mg (has no administration in time range)  iohexol  (OMNIPAQUE ) 350 MG/ML injection 100 mL (100 mLs Intravenous Contrast Given 12/21/23 1330)                                                                                                                                     Procedures Procedures  (including critical care time)  Medical Decision Making / ED Course    Medical Decision Making:    RAELYNNE FADEL is a 28 y.o. female with past medical history as below, significant for shingles, IDA, lupus who presents to the ED with complaint of facial swelling. The complaint involves an extensive differential diagnosis and also carries with it a high risk of complications and morbidity.  Serious etiology was considered. Ddx includes but is not limited to: mandible abscess, parotid mass/abscess, sialoadenitis, cellulitis,  dental source, etc.  Complete initial physical exam performed, notably the patient was  in stress, airway intact.    Reviewed and confirmed nursing documentation for past medical history, family history, social history.  Vital signs reviewed.     Brief summary:  28 year old female history as above here with left-sided facial swelling.  Onset in the last 24 hours.  No fever.  No drooling stridor or trismus, no dyspnea.  She has minimal dysphagia.  Tolerating p.o.. Given rapid onset of swelling will get screening labs and get CT.   Clinical Course as of 12/21/23 1457  Sat Dec 21, 2023  1438 CT shows parotiditis, no abscess on imaging.  She has no purulent drainage expressed from Stensen's duct.  She has no fever, she has no leukocytosis.  No overlying cellulitis left side of her mandible.   She is tolerant p.o. no difficulty.  Feeling somewhat better.  Will start on oral Augmentin , encouraged sialogogues , massage/warm compresses, rehydration. F/u pcp, strict return precautions  [SG]    Clinical Course User Index [SG] Russella Courts A, DO   Well-appearing, nontoxic, afebrile, labs are stable.  Patient in no distress and overall condition improved here in the ED. Detailed discussions were had with the patient/guardian regarding current findings, and need for close f/u with PCP or on call doctor. The patient/guardian has been instructed to return immediately if the symptoms worsen in any way for re-evaluation. Patient/guardian verbalized understanding and is in agreement with current care plan. All questions answered prior to discharge.              Additional history obtained: -Additional history obtained from na -External records from outside source obtained and reviewed including: Chart review including previous notes, labs, imaging, consultation notes including  Allergies Prior ed visit   Lab Tests: -I ordered, reviewed, and interpreted labs.   The pertinent results  include:   Labs Reviewed  CBC WITH DIFFERENTIAL/PLATELET - Abnormal; Notable for the following components:      Result Value   Hemoglobin 11.9 (*)    HCT 35.3 (*)    All other components within normal limits  BASIC METABOLIC PANEL WITH GFR - Abnormal; Notable for the following components:   CO2 18 (*)    Calcium  8.6 (*)    All other components within normal limits  HCG, SERUM, QUALITATIVE    Notable for labs stable  EKG   EKG Interpretation Date/Time:    Ventricular Rate:    PR Interval:    QRS Duration:    QT Interval:    QTC Calculation:   R Axis:      Text Interpretation:           Imaging Studies ordered: I ordered imaging studies including ct neck I independently visualized the following imaging with scope of interpretation limited to determining acute life threatening conditions related to emergency care; findings noted above I agree with the radiologist interpretation If any imaging was obtained with contrast I closely monitored patient for any possible adverse reaction a/w contrast administration in the emergency department   Medicines ordered and prescription drug management: Meds ordered this encounter  Medications   iohexol  (OMNIPAQUE ) 350 MG/ML injection 100 mL   amoxicillin -clavulanate (AUGMENTIN ) 875-125 MG per tablet 1 tablet   acetaminophen  (TYLENOL ) tablet 1,000 mg   amoxicillin -clavulanate (AUGMENTIN ) 875-125 MG tablet    Sig: Take 1 tablet by mouth every 12 (twelve) hours.    Dispense:  14 tablet    Refill:  0    -I have reviewed the patients home medicines and have made adjustments as  needed   Consultations Obtained: na   Cardiac Monitoring:  Continuous pulse oximetry interpreted by myself, 100% on RA.    Social Determinants of Health:  Diagnosis or treatment significantly limited by social determinants of health: na   Reevaluation: After the interventions noted above, I reevaluated the patient and found that they have  improved  Co morbidities that complicate the patient evaluation  Past Medical History:  Diagnosis Date   Anxiety    Chest discomfort 09/2015   Pt states due to shortness of breath   Cough present for greater than 3 weeks    Late Entry to [employer]- April 2020- Social Distancing  11/27/2018   Pt signed up for [employer]. Will come to office to pick up BP cuff.    Lupus (systemic lupus erythematosus) (HCC)    dx 2010   Pneumonia    2015   Shingles 06/24/2022   Shortness of breath dyspnea 09/2015   changed inhaler with relief      Dispostion: Disposition decision including need for hospitalization was considered, and patient discharged from emergency department.    Final Clinical Impression(s) / ED Diagnoses Final diagnoses:  Parotitis, acute  Facial swelling        Teddi Favors, DO 12/21/23 1457

## 2023-12-21 NOTE — ED Triage Notes (Signed)
 C/o left sided jaw swelling x 2 days. States her left ear was itching and then noticed swelling. Denies dental pain/problems. States swallowing is "slowed".

## 2023-12-21 NOTE — Discharge Instructions (Addendum)
 It was a pleasure caring for you today in the emergency department.  Drink plenty of fluids over the next few days.  Take antibiotics as directed.  You can suck on sour candy or lemons, lemon flavored candy.  Use warm compresses and gently massage the swollen area.  Alternate acetaminophen  and ibuprofen  as directed on box for discomfort.  Return if swelling worsens, you develop fever, worsening pain, bad taste in your mouth, etc.  Please return to the emergency department for any worsening or worrisome symptoms.

## 2023-12-21 NOTE — ED Notes (Signed)
 Patient transported to CT via wheelchair

## 2024-01-07 ENCOUNTER — Telehealth: Admitting: Family Medicine

## 2024-01-07 DIAGNOSIS — R3989 Other symptoms and signs involving the genitourinary system: Secondary | ICD-10-CM | POA: Diagnosis not present

## 2024-01-07 MED ORDER — CEPHALEXIN 500 MG PO CAPS: 500.0000 mg | ORAL_CAPSULE | Freq: Two times a day (BID) | ORAL | 0 refills | Status: AC

## 2024-01-07 NOTE — Progress Notes (Signed)

## 2024-01-10 DIAGNOSIS — M329 Systemic lupus erythematosus, unspecified: Secondary | ICD-10-CM | POA: Diagnosis not present

## 2024-01-10 DIAGNOSIS — Z1331 Encounter for screening for depression: Secondary | ICD-10-CM | POA: Diagnosis not present

## 2024-01-10 DIAGNOSIS — Z7960 Long term (current) use of unspecified immunomodulators and immunosuppressants: Secondary | ICD-10-CM | POA: Diagnosis not present

## 2024-01-12 DIAGNOSIS — M3219 Other organ or system involvement in systemic lupus erythematosus: Secondary | ICD-10-CM | POA: Diagnosis not present

## 2024-01-12 DIAGNOSIS — Z79899 Other long term (current) drug therapy: Secondary | ICD-10-CM | POA: Diagnosis not present

## 2024-01-12 DIAGNOSIS — M1389 Other specified arthritis, multiple sites: Secondary | ICD-10-CM | POA: Diagnosis not present

## 2024-01-30 ENCOUNTER — Other Ambulatory Visit: Payer: Self-pay | Admitting: Obstetrics & Gynecology

## 2024-01-30 ENCOUNTER — Ambulatory Visit (INDEPENDENT_AMBULATORY_CARE_PROVIDER_SITE_OTHER)

## 2024-01-30 VITALS — BP 99/65 | HR 70 | Wt 157.0 lb

## 2024-01-30 DIAGNOSIS — Z3201 Encounter for pregnancy test, result positive: Secondary | ICD-10-CM

## 2024-01-30 LAB — POCT URINE PREGNANCY: Preg Test, Ur: POSITIVE — AB

## 2024-01-30 MED ORDER — PRENATAL 28-0.8 MG PO TABS: 1.0000 | ORAL_TABLET | Freq: Every day | ORAL | 12 refills | Status: AC

## 2024-01-30 NOTE — Progress Notes (Addendum)
 Kelly [name] here for a UPT. Pt had a positive upt at home. LMP is 12/27/23.     UPT in office Positive.    Reviewed medications and informed to start a PNV, if not already. Pt to follow up in 4 weeks for New OB Intake visit.

## 2024-02-11 ENCOUNTER — Other Ambulatory Visit: Payer: Self-pay

## 2024-02-11 ENCOUNTER — Emergency Department (HOSPITAL_BASED_OUTPATIENT_CLINIC_OR_DEPARTMENT_OTHER): Admission: EM | Admit: 2024-02-11 | Discharge: 2024-02-11 | Disposition: A | Discharge status: Home / Self Care

## 2024-02-11 ENCOUNTER — Encounter (HOSPITAL_BASED_OUTPATIENT_CLINIC_OR_DEPARTMENT_OTHER): Payer: Self-pay | Admitting: Emergency Medicine

## 2024-02-11 ENCOUNTER — Emergency Department (HOSPITAL_BASED_OUTPATIENT_CLINIC_OR_DEPARTMENT_OTHER)

## 2024-02-11 DIAGNOSIS — R1030 Lower abdominal pain, unspecified: Secondary | ICD-10-CM | POA: Diagnosis not present

## 2024-02-11 DIAGNOSIS — O209 Hemorrhage in early pregnancy, unspecified: Secondary | ICD-10-CM | POA: Diagnosis not present

## 2024-02-11 DIAGNOSIS — R9431 Abnormal electrocardiogram [ECG] [EKG]: Secondary | ICD-10-CM | POA: Diagnosis not present

## 2024-02-11 DIAGNOSIS — Z3A01 Less than 8 weeks gestation of pregnancy: Secondary | ICD-10-CM | POA: Diagnosis not present

## 2024-02-11 DIAGNOSIS — Z3A00 Weeks of gestation of pregnancy not specified: Secondary | ICD-10-CM | POA: Diagnosis not present

## 2024-02-11 DIAGNOSIS — D709 Neutropenia, unspecified: Secondary | ICD-10-CM | POA: Insufficient documentation

## 2024-02-11 DIAGNOSIS — R0789 Other chest pain: Secondary | ICD-10-CM | POA: Insufficient documentation

## 2024-02-11 DIAGNOSIS — O26891 Other specified pregnancy related conditions, first trimester: Secondary | ICD-10-CM | POA: Diagnosis not present

## 2024-02-11 LAB — COMPREHENSIVE METABOLIC PANEL WITH GFR
ALT: 13 U/L (ref 0–44)
AST: 16 U/L (ref 15–41)
Albumin: 3.8 g/dL (ref 3.5–5.0)
Alkaline Phosphatase: 56 U/L (ref 38–126)
Anion gap: 11 (ref 5–15)
BUN: 8 mg/dL (ref 6–20)
CO2: 23 mmol/L (ref 22–32)
Calcium: 9 mg/dL (ref 8.9–10.3)
Chloride: 104 mmol/L (ref 98–111)
Creatinine, Ser: 0.59 mg/dL (ref 0.44–1.00)
GFR, Estimated: >60 mL/min (ref >60)
Glucose, Bld: 89 mg/dL (ref 70–99)
Potassium: 3.8 mmol/L (ref 3.5–5.1)
Sodium: 138 mmol/L (ref 135–145)
Total Bilirubin: 0.3 mg/dL (ref 0.0–1.2)
Total Protein: 7.3 g/dL (ref 6.5–8.1)

## 2024-02-11 LAB — CBC WITH DIFFERENTIAL/PLATELET
Abs Immature Granulocytes: 0.02 10*3/uL (ref 0.00–0.07)
Basophils Absolute: 0 10*3/uL (ref 0.0–0.1)
Basophils Relative: 0 %
Eosinophils Absolute: 0.1 10*3/uL (ref 0.0–0.5)
Eosinophils Relative: 3 %
HCT: 31.3 % — ABNORMAL LOW (ref 36.0–46.0)
Hemoglobin: 10.7 g/dL — ABNORMAL LOW (ref 12.0–15.0)
Immature Granulocytes: 1 %
Lymphocytes Relative: 24 %
Lymphs Abs: 0.9 10*3/uL (ref 0.7–4.0)
MCH: 30.4 pg (ref 26.0–34.0)
MCHC: 34.2 g/dL (ref 30.0–36.0)
MCV: 88.9 fL (ref 80.0–100.0)
Monocytes Absolute: 0.5 10*3/uL (ref 0.1–1.0)
Monocytes Relative: 14 %
Neutro Abs: 2.1 10*3/uL (ref 1.7–7.7)
Neutrophils Relative %: 58 %
Platelets: 333 10*3/uL (ref 150–400)
RBC: 3.52 MIL/uL — ABNORMAL LOW (ref 3.87–5.11)
RDW: 13.9 % (ref 11.5–15.5)
Smear Review: NORMAL
WBC: 3.7 10*3/uL — ABNORMAL LOW (ref 4.0–10.5)
nRBC: 0 % (ref 0.0–0.2)

## 2024-02-11 LAB — HCG, QUANTITATIVE, PREGNANCY: hCG, Beta Chain, Quant, S: 17962 m[IU]/mL — ABNORMAL HIGH (ref <5)

## 2024-02-11 NOTE — ED Triage Notes (Signed)
 Pt states she has been getting over a cough last week.  Pt had coughed for 2 weeks before but her left lower ribs are sore with deep breath, cough, sneeze, etc.  Pt states fever at home .  Pt has history of lupus.  Pt also is [redacted] weeks pregnant and is having some cramping.  No leaking of fluids, no bleeding.  Pt admits to brown discharge, no known foul smell.

## 2024-02-11 NOTE — ED Provider Notes (Signed)
 [place] [facility] AT [facility] Provider Note   CSN: [id number] Arrival date & time: 02/11/24  1541     Patient presents with: Abdominal Pain and Chest Pain (Lower Rib pain)   Kelly [name] is a 28 y.o. female with history of SLE, currently [redacted] weeks pregnant, presents with concern for abdominal cramping.  Reports this has been ongoing throughout her pregnancy, but worse today.  States this is also accompanied by a brown appearing vaginal discharge which is new for her. Denies any urinary symptoms and is reporting normal bowel movements. Also reports a pain on the left side of her rib cage underneath her left breast that has been ongoing for the past 3 weeks.  States she had a URI 3 weeks ago and this pain seemed related to her cough.  However, it is not improving.  Pain worse with deep breaths or coughing.  She reports resolution of her URI symptoms.  Denies any fever or chills.  Denies any recent surgeries or hospitalizations, recent long plane or car rides, any lower extremity pain or swelling, any history of DVT or PE.   States she has an appointment with OBGYN at the [facility] at [place] on 02/27/24 to establish care for this pregnancy    Abdominal Pain Associated symptoms: chest pain   Chest Pain Associated symptoms: abdominal pain        Prior to Admission medications   Medication Sig Start Date End Date Taking? Authorizing Provider  Anifrolumab-fnia (SAPHNELO) 300 MG/2ML SOLN Inject 300 mg into the vein every 30 (thirty) days.    [provider]  ascorbic acid  (VITAMIN C ) 500 MG tablet Take 1 tablet (500 mg total) by mouth daily. 04/27/22   Verlyn Goad, MD  azaTHIOprine  (IMURAN ) 50 MG tablet Take 50 mg by mouth daily. 01/30/22   [provider]  Calcium  Carb-Cholecalciferol (CALCIUM  600-D) 600-10 MG-MCG TABS Take 125 tablets by mouth daily.    [provider]  cholecalciferol (VITAMIN D3) 25 MCG (1000  UNIT) tablet Take 1,000 Units by mouth once a week.    [provider]  clomiPHENE  (CLOMID ) 50 MG tablet Take 2 tablets (100 mg total) by mouth daily. Days 5 - 9 of menstrual cycle. Patient not taking: Reported on 01/30/2024 01/15/23   Gabrielle Joiner, MD  Control Gel Formula Dressing (DUODERM CGF EXTRA THIN EX) Apply topically.    [provider]  cyclobenzaprine  (FLEXERIL ) 10 MG tablet Take 1 tablet (10 mg total) by mouth 2 (two) times daily as needed for muscle spasms. Patient not taking: Reported on 01/30/2024 07/02/23   Mandy Second, PA-C  enoxaparin  (LOVENOX ) 60 MG/0.6ML injection Inject 60 mg into the skin every 12 (twelve) hours. Patient not taking: Reported on 06/01/2022 03/23/22 09/19/22  [provider]  HYDROcodone -acetaminophen  (NORCO/VICODIN) 5-325 MG tablet Take 1 tablet by mouth every 6 (six) hours as needed for moderate pain. 04/26/22   Verlyn Goad, MD  hydroxychloroquine  (PLAQUENIL ) 200 MG tablet Take 400 mg by mouth daily.    [provider]  medroxyPROGESTERone  (PROVERA ) 10 MG tablet Take 1 tablet (10 mg total) by mouth daily for 10 days. 06/01/22 06/11/22  Ajewole, Christana, MD  medroxyPROGESTERone  (PROVERA ) 10 MG tablet Take 2 tablets (20 mg total) by mouth daily. 07/12/22   Dawson, Rolitta, CNM  molnupiravir  EUA (LAGEVRIO ) 200 mg CAPS capsule Take 4 capsules (800 mg total) by mouth 2 (two) times daily. Complete bottle given to you at discharge.  04/26/22   Verlyn Goad, MD  mupirocin  cream (BACTROBAN ) 2 % Apply 1 Application topically 2 (two) times daily. 07/01/22   Clay Cummins, MD  phenazopyridine  (PYRIDIUM ) 200 MG tablet Take 1 tablet (200 mg total) by mouth 3 (three) times daily. Patient not taking: Reported on 01/30/2024 07/31/23   Tresia Fruit, MD  predniSONE  (DELTASONE ) 5 MG tablet Take 5-25 mg by mouth as directed. Taking 25 mg daily currently until on the  02-21-22 will take 20 mg for a week, then titrate down 5 mg each week until  taking 5 mg daily. 07/04/21   [provider]  Prenatal 28-0.8 MG TABS Take 1 tablet by mouth daily. 01/30/24   Tresia Fruit, MD  valACYclovir  (VALTREX ) 1000 MG tablet Take 1 tablet (1,000 mg total) by mouth 3 (three) times daily. Patient not taking: Reported on 08/23/2022 07/01/22   Clay Cummins, MD  zinc  sulfate 220 (50 Zn) MG capsule Take 1 capsule (220 mg total) by mouth daily. 04/27/22   Verlyn Goad, MD    Allergies: Doxycycline  and Methotrexate    Review of Systems  Cardiovascular:  Positive for chest pain.  Gastrointestinal:  Positive for abdominal pain.    Updated Vital Signs BP 117/88   Pulse 71   Temp (!) 97.3 F (36.3 C)   Resp 20   Ht 5' 2 (1.575 m)   Wt 73.9 kg   LMP 12/27/2023 (Exact Date)   SpO2 100%   BMI 29.81 kg/m   Physical Exam Vitals and nursing note reviewed.  Constitutional:      General: She is not in acute distress.    Appearance: She is well-developed.  HENT:     Head: Normocephalic and atraumatic.   Eyes:     Conjunctiva/sclera: Conjunctivae normal.    Cardiovascular:     Rate and Rhythm: Normal rate and regular rhythm.     Heart sounds: No murmur heard.    Comments: 2+ radial and pedal pulses bilaterally Pulmonary:     Effort: Pulmonary effort is normal. No respiratory distress.     Breath sounds: Normal breath sounds.  Abdominal:     Palpations: Abdomen is soft.     Tenderness: There is no abdominal tenderness.  Genitourinary:    Comments: Declines  Musculoskeletal:        General: No swelling.       Arms:     Cervical back: Neck supple.     Right lower leg: No edema.     Left lower leg: No edema.     Comments: Pain of left chest wall reproducible with palpation Ulcerations of LLE without open wounds or edema, consistent with SLE and at baseline per patient   Skin:    General: Skin is warm and dry.     Capillary Refill: Capillary refill takes less than 2 seconds.   Neurological:     Mental Status: She is  alert.   Psychiatric:        Mood and Affect: Mood normal.     (all labs ordered are listed, but only abnormal results are displayed) Labs Reviewed  CBC WITH DIFFERENTIAL/PLATELET - Abnormal; Notable for the following components:      Result Value   WBC 3.7 (*)    RBC 3.52 (*)    Hemoglobin 10.7 (*)    HCT 31.3 (*)    All other components within normal limits  HCG, QUANTITATIVE, PREGNANCY - Abnormal; Notable for the following components:   hCG, Beta  Chain, Vinton Greig, [place] 19,147 (*)    All other components within normal limits  COMPREHENSIVE METABOLIC PANEL WITH GFR  URINALYSIS, ROUTINE W REFLEX MICROSCOPIC    EKG: EKG Interpretation Date/Time:  Tuesday February 11 2024 16:01:16 EDT Ventricular Rate:  75 PR Interval:  136 QRS Duration:  88 QT Interval:  373 QTC Calculation: 417 R Axis:   89  Text Interpretation: Sinus rhythm Low voltage, precordial leads Borderline T abnormalities, anterior leads Confirmed by Elise Guile [phone]) on 02/11/2024 4:49:04 PM  Radiology: US  OB LESS THAN 14 WEEKS WITH OB TRANSVAGINAL Result Date: 02/11/2024 CLINICAL DATA:  Cramping and brown discharge. EXAM: OBSTETRIC <14 WK US  AND TRANSVAGINAL OB US  TECHNIQUE: Both transabdominal and transvaginal ultrasound examinations were performed for complete evaluation of the gestation as well as the maternal uterus, adnexal regions, and pelvic cul-de-sac. Transvaginal technique was performed to assess early pregnancy. COMPARISON:  None Available. FINDINGS: Intrauterine gestational sac: Single Yolk sac:  Visualized. Embryo:  Possible small CRL Cardiac Activity: Not Visualized. Heart Rate: N/A  bpm MSD: 13.9 mm   6 w   2 d Subchorionic hemorrhage:  None visualized. Maternal uterus/adnexae: The bilateral ovaries are visualized and are normal in appearance. No pelvic free fluid is seen. IMPRESSION: Probable early intrauterine gestational sac and yolk sac, but no fetal pole, or cardiac activity yet visualized. Recommend  follow-up quantitative B-HCG levels and follow-up US  in 14 days to assess viability. This recommendation follows SRU consensus guidelines: Diagnostic Criteria for Nonviable Pregnancy Early in the First Trimester. Mel Spine Med 2013; 213:0865-78. Electronically Signed   By: Virgle Grime M.D.   On: 02/11/2024 18:10     Procedures   Medications Ordered in the ED - No data to display                                  Medical Decision Making Amount and/or Complexity of Data Reviewed Labs: ordered. Radiology: ordered.     Differential diagnosis includes but is not limited to pregnancy related concerns, UTI, appendicitis, musculoskeletal pain, costochondritis, PE, pneumonia, URI  ED Course:  Upon initial evaluation, patient is well appearing, no acute distress. Stable vitals. No abdominal TTP. Pain of left chest wall reproduced with palpation.   Labs Ordered: I Ordered, and personally interpreted labs.  The pertinent results include:   CBC with neutropenia 3.7, hemoglobin 10.7 CMP within normal limits Beta HCG 17,962  Imaging Studies ordered: I ordered imaging studies including transvaginal US   I independently visualized the imaging with scope of interpretation limited to determining acute life threatening conditions related to emergency care. Imaging showed  Probable early intrauterine gestational sac and yolk sac, but no fetal pole, or cardiac activity yet visualized.  Recommended follow-up quantitative beta-hCG levels and follow-up ultrasound in 14 days to assess viability I agree with the radiologist interpretation   Medications Given: None  Upon re-evaluation, patient still remains well appearing with stable vitals.  Feel her pain in the left side of her chest is musculoskeletal pain secondary to her coughing from the URI as it is reproducible with palpation.  She is no longer having any cough, fevers, low concern for pneumonia, pleural effusion, or other abnormality that  would warrant chest x-ray. She is PERC negative, very low concern for PE. I also discussed this with my attending Dr. Linder Revere who does not feel patient needs D-dimer for evaluation into her left sided rib/chest pain at this time.  Ultrasound reveals probable early intrauterine gestational sac and yolk sac, estimated to be at 6 weeks and 2 days.  Appropriate beta-hCG level.  Hemoglobin slightly low at 10.7, but asymptomatic without any dizziness, shortness of breath, lightheadedness. She reports very light vaginal brown discharge. Low concern for acute blood loss anemia that would need further monitoring. Abdominal pain not very bothersome to patient.   Feel she is stable and appropriate for discharge home at this time    Impression: Pregnancy  Left sided chest musculoskeletal pain  Disposition:  The patient was discharged home with instructions to follow-up with her OB/GYN in 14 days to have repeat beta-hCG and ultrasound.  She states she does have an appointment scheduled for July 3.  I encouraged her to call to let them know about the recommendation today so that the ultrasound can be scheduled for appointment.  Have her hemoglobin repeated with PCP or OB/GYN within the next month.  Continue taking her prenatal daily vitamin.  Tylenol  as needed for pain. Return precautions given.    This chart was dictated using voice recognition software, Dragon. Despite the best efforts of this provider to proofread and correct errors, errors may still occur which can change documentation meaning.       Final diagnoses:  Lower abdominal pain  Vaginal bleeding in pregnancy, first trimester    ED Discharge Orders     None          Rexie Catena, PA-C 02/11/24 1830    Rolinda Climes, DO 02/11/24 2000

## 2024-02-11 NOTE — Discharge Instructions (Addendum)
 Your ultrasound showed that you are approximately 6 weeks 2 days. I have included your ultrasound results below for your reference. A repeat ultrasound and repeat of the pregnancy hormone (beta-hCG) is recommended in 14 days to ensure you are progressing appropriately.  As discussed, please contact your OB/GYN to have this done at your next appointment.   Please continue taking a prenatal daily vitamin  Your kidney labs are normal today.  Your electrolytes are normal.  You had a low hemoglobin today at 10.7. Please have your PCP or OBGYN repeat this value within the next month to ensure this is remaining stable.   Pain in your chest seems to be due to muscle pain.  You may take up to 1000mg  of tylenol  every 6 hours as needed for pain.  Do not take more then 4g per day. You may also use heating packs on the area to help with pain.   Return to the ER or to the [facility] unit (MAU) at [place] if you develop any worsening abdominal pain, heavy vaginal bleeding any other new or concerning symptoms   EXAM:  OBSTETRIC <14 WK US  AND TRANSVAGINAL OB US     TECHNIQUE:  Both transabdominal and transvaginal ultrasound examinations were  performed for complete evaluation of the gestation as well as the  maternal uterus, adnexal regions, and pelvic cul-de-sac.  Transvaginal technique was performed to assess early pregnancy.    COMPARISON:  None Available.    FINDINGS:  Intrauterine gestational sac: Single    Yolk sac:  Visualized.    Embryo:  Possible small CRL    Cardiac Activity: Not Visualized.    Heart Rate: N/A  bpm    MSD: 13.9 mm   6 w   2 d    Subchorionic hemorrhage:  None visualized.    Maternal uterus/adnexae: The bilateral ovaries are visualized and  are normal in appearance.    No pelvic free fluid is seen.    IMPRESSION:  Probable early intrauterine gestational sac and yolk sac, but no  fetal pole, or cardiac activity yet visualized. Recommend follow-up   quantitative B-HCG levels and follow-up US  in 14 days to assess  viability. This recommendation follows SRU consensus guidelines:  Diagnostic Criteria for Nonviable Pregnancy Early in the First  Trimester. Mel Spine Med 2013; 657:8469-62.      Electronically Signed    By: Virgle Grime M.D.    On: 02/11/2024 18:10

## 2024-02-11 NOTE — ED Notes (Signed)
 Discharge instructions reviewed with patient. Patient verbalizes understanding, no further questions at this time. Medications and follow up information provided. No acute distress noted at time of departure.

## 2024-02-15 DIAGNOSIS — M3219 Other organ or system involvement in systemic lupus erythematosus: Secondary | ICD-10-CM | POA: Diagnosis not present

## 2024-02-15 DIAGNOSIS — Z79899 Other long term (current) drug therapy: Secondary | ICD-10-CM | POA: Diagnosis not present

## 2024-02-16 ENCOUNTER — Inpatient Hospital Stay (HOSPITAL_COMMUNITY)

## 2024-02-16 ENCOUNTER — Inpatient Hospital Stay (HOSPITAL_COMMUNITY)
Admission: AD | Admit: 2024-02-16 | Discharge: 2024-02-16 | Disposition: A | Discharge status: Home / Self Care | Attending: Obstetrics and Gynecology | Admitting: Obstetrics and Gynecology

## 2024-02-16 DIAGNOSIS — Z3A01 Less than 8 weeks gestation of pregnancy: Secondary | ICD-10-CM

## 2024-02-16 DIAGNOSIS — O418X10 Other specified disorders of amniotic fluid and membranes, first trimester, not applicable or unspecified: Secondary | ICD-10-CM | POA: Diagnosis not present

## 2024-02-16 DIAGNOSIS — O208 Other hemorrhage in early pregnancy: Secondary | ICD-10-CM | POA: Diagnosis not present

## 2024-02-16 DIAGNOSIS — N939 Abnormal uterine and vaginal bleeding, unspecified: Secondary | ICD-10-CM

## 2024-02-16 DIAGNOSIS — O468X1 Other antepartum hemorrhage, first trimester: Secondary | ICD-10-CM | POA: Diagnosis not present

## 2024-02-16 DIAGNOSIS — O34219 Maternal care for unspecified type scar from previous cesarean delivery: Secondary | ICD-10-CM | POA: Diagnosis not present

## 2024-02-16 LAB — URINALYSIS, ROUTINE W REFLEX MICROSCOPIC
Bilirubin Urine: NEGATIVE
Glucose, UA: NEGATIVE mg/dL
Hgb urine dipstick: NEGATIVE
Ketones, ur: NEGATIVE mg/dL
Leukocytes,Ua: NEGATIVE
Nitrite: NEGATIVE
Protein, ur: NEGATIVE mg/dL
Specific Gravity, Urine: 1.011 (ref 1.005–1.030)
pH: 7 (ref 5.0–8.0)

## 2024-02-16 LAB — WET PREP, GENITAL
Sperm: NONE SEEN
Trich, Wet Prep: NONE SEEN
WBC, Wet Prep HPF POC: <10 (ref <10)
Yeast Wet Prep HPF POC: NONE SEEN

## 2024-02-16 LAB — HCG, QUANTITATIVE, PREGNANCY: hCG, Beta Chain, Quant, S: 65385 m[IU]/mL — ABNORMAL HIGH (ref <5)

## 2024-02-16 NOTE — MAU Provider Note (Signed)
 Chief Complaint:  Vaginal Bleeding   HPI   Event Date/Time   First Provider Initiated Contact with Patient 02/16/24 1148      Kelly [name] is a 28 y.o. G2P0101 at [redacted]w[redacted]d who presents to maternity admissions reporting cramping and vaginal bleeding. The cramping is mild, located in her lower abdomen, and has been ongoing since finding out that she is pregnant. It has not gotten worse or better. She first noticed the bleeding when wiping after using the bathroom this morning. Light pink in color, small amount on the tissue. Has on a panty liner that is still clean. She denies any other symptoms.  Pregnancy Course: Has Initial OB appointment scheduled at [facility] for July 3rd.  Past Medical History:  Diagnosis Date   Anxiety    Chest discomfort 09/2015   Pt states due to shortness of breath   Cough present for greater than 3 weeks    Late Entry to [employer]- April 2020- Social Distancing  11/27/2018   Pt signed up for [employer]. Will come to office to pick up BP cuff.    Lupus (systemic lupus erythematosus) (HCC)    dx 2010   Pneumonia    2015   Shingles 06/24/2022   Shortness of breath dyspnea 09/2015   changed inhaler with relief   OB History  Gravida Para Term Preterm AB Living  2 1  1  1   SAB IAB Ectopic Multiple Live Births     0 1    # Outcome Date GA Lbr Len/2nd Weight Sex Type Anes PTL Lv  2 Current           1 Preterm 03/16/19 [redacted]w[redacted]d  2384 g M CS-LTranv EPI  LIV   Past Surgical History:  Procedure Laterality Date   BRONCHOSCOPY  11/2013   CESAREAN SECTION N/A 03/16/2019   Procedure: CESAREAN SECTION;  Surgeon: Barbra Lang PARAS, DO;  Location: [facility] LD ORS;  Service: Obstetrics;  Laterality: N/A;   WISDOM TOOTH EXTRACTION  2014   Family History  Problem Relation Age of Onset   Thyroid  disease Maternal Grandmother    Lupus Maternal Grandmother    Diabetes Paternal Grandmother    Social History   Tobacco Use   Smoking status: Never   Smokeless tobacco: Never   Vaping Use   Vaping status: Never Used  Substance Use Topics   Alcohol use: Yes    Comment: occ   Drug use: Not Currently   Allergies  Allergen Reactions   Doxycycline  Hives, Rash and Other (See Comments)    Blister patches on skin Bullous eruption, required hospital treatment 01/2022   Methotrexate Other (See Comments)    Other Reaction(s): hairloss   No medications prior to admission.    I have reviewed patient's Past Medical Hx, Surgical Hx, Family Hx, Social Hx, medications and allergies.   ROS  Pertinent items noted in HPI and remainder of comprehensive ROS otherwise negative.   PHYSICAL EXAM  Patient Vitals for the past 24 hrs:  BP Temp Temp src Pulse Resp SpO2 Height Weight  02/16/24 1054 123/81 98 F (36.7 C) Oral 72 17 100 % 5' 2 (1.575 m) 72.6 kg    Constitutional: Well-developed, well-nourished female in no acute distress.  Cardiovascular: normal rate, warm and well-perfused Respiratory: normal effort, no problems with respiration noted GI: Abd soft, non-tender, non-distended MS: Extremities nontender, no edema, normal ROM Neurologic: Alert and oriented x 4.   Labs: Results for orders placed or performed during the hospital encounter  of 02/16/24 (from the past 24 hours)  Urinalysis, Routine w reflex microscopic -Urine, Clean Catch     Status: None   Collection Time: 02/16/24 11:06 AM  Result Value Ref Range   Color, Urine YELLOW YELLOW   APPearance CLEAR CLEAR   Specific Gravity, Urine 1.011 1.005 - 1.030   pH 7.0 5.0 - 8.0   Glucose, UA NEGATIVE NEGATIVE mg/dL   Hgb urine dipstick NEGATIVE NEGATIVE   Bilirubin Urine NEGATIVE NEGATIVE   Ketones, ur NEGATIVE NEGATIVE mg/dL   Protein, ur NEGATIVE NEGATIVE mg/dL   Nitrite NEGATIVE NEGATIVE   Leukocytes,Ua NEGATIVE NEGATIVE  Wet prep, genital     Status: Abnormal   Collection Time: 02/16/24 11:06 AM   Specimen: Urine, Clean Catch  Result Value Ref Range   Yeast Wet Prep HPF POC NONE SEEN NONE SEEN    Trich, Wet Prep NONE SEEN NONE SEEN   Clue Cells Wet Prep HPF POC PRESENT (A) NONE SEEN   WBC, Wet Prep HPF POC <10 <10   Sperm NONE SEEN   hCG, quantitative, pregnancy     Status: Abnormal   Collection Time: 02/16/24 11:24 AM  Result Value Ref Range   hCG, Beta Chain, Quant, S 65,385 (H) <5 mIU/mL    Imaging:  US  OB LESS THAN 14 WEEKS WITH OB TRANSVAGINAL Result Date: 02/16/2024 CLINICAL DATA:  Vaginal bleeding.  Beta hCG = 65,385 EXAM: OBSTETRIC <14 WK US  AND TRANSVAGINAL OB US  TECHNIQUE: Both transabdominal and transvaginal ultrasound examinations were performed for complete evaluation of the gestation as well as the maternal uterus, adnexal regions, and pelvic cul-de-sac. Transvaginal technique was performed to assess early pregnancy. COMPARISON:  Obstetric ultrasound examination dated 02/11/2024 FINDINGS: Intrauterine gestational sac: Single Yolk sac:  Visualized. Embryo:  Visualized. Cardiac Activity: Visualized. Heart Rate: 131 bpm CRL:  6.6 mm   6 w   4 d                  US  EDC: 10/07/2024 Subchorionic hemorrhage: Small subchorionic hemorrhage measures 1.4 x 1.0 x 1.0 cm. Maternal uterus/adnexae: Normal ovaries. Postsurgical changes of prior cesarean section. IMPRESSION: IMPRESSION 1. Single intrauterine pregnancy with cardiac activity measures 6 weeks 4 days' gestation by crown-rump length. 2. Small subchorionic hemorrhage. Electronically Signed   By: Limin  Xu M.D.   On: 02/16/2024 14:06    MDM & MAU COURSE  MDM: Patient requesting repeat US  for peace of mind. Informed her of the possibility that we wont have any new information based on an US  today as it hasn't been a week since her last US . She verbalized understanding and would like to proceed with an US  today in addition to the quant level.  MAU Course: Orders Placed This Encounter  Procedures   Wet prep, genital   US  OB LESS THAN 14 WEEKS WITH OB TRANSVAGINAL   Urinalysis, Routine w reflex microscopic -Urine, Clean Catch    hCG, quantitative, pregnancy   Discharge patient Discharge disposition: 01-Home or Self Care; Discharge patient date: 02/16/2024   No orders of the defined types were placed in this encounter.   ASSESSMENT   1. Vaginal bleeding   2. Subchorionic hematoma in first trimester, single or unspecified fetus   3. [redacted] weeks gestation of pregnancy    Reviewed US  findings with patient. FHR observed today with a small subchorionic hemorrhage. Discussed with patient that this is a condition commonly seen in early pregnancy and that the presence of a FHR is reassuring. Return precautions discussed. She  verbalized understanding.  PLAN   Discharge home in stable condition with return precautions.  Follow up in the clinic for prenatal care.    Allergies as of 02/16/2024       Reactions   Doxycycline  Hives, Rash, Other (See Comments)   Blister patches on skin Bullous eruption, required hospital treatment 01/2022   Methotrexate Other (See Comments)   Other Reaction(s): hairloss        Medication List     STOP taking these medications    Calcium  600-D 600-10 MG-MCG Tabs Generic drug: Calcium  Carb-Cholecalciferol   enoxaparin  60 MG/0.6ML injection Commonly known as: LOVENOX    molnupiravir  EUA 200 mg Caps capsule Commonly known as: LAGEVRIO    mupirocin  cream 2 % Commonly known as: BACTROBAN    valACYclovir  1000 MG tablet Commonly known as: VALTREX        TAKE these medications    ascorbic acid  500 MG tablet Commonly known as: VITAMIN C  Take 1 tablet (500 mg total) by mouth daily.   azaTHIOprine  50 MG tablet Commonly known as: IMURAN  Take 50 mg by mouth daily.   cholecalciferol 25 MCG (1000 UNIT) tablet Commonly known as: VITAMIN D3 Take 1,000 Units by mouth once a week.   Clomid  50 MG tablet Generic drug: clomiPHENE  Take 2 tablets (100 mg total) by mouth daily. Days 5 - 9 of menstrual cycle.   cyclobenzaprine  10 MG tablet Commonly known as: FLEXERIL  Take 1 tablet (10 mg  total) by mouth 2 (two) times daily as needed for muscle spasms.   DUODERM CGF EXTRA THIN EX Apply topically.   HYDROcodone -acetaminophen  5-325 MG tablet Commonly known as: NORCO/VICODIN Take 1 tablet by mouth every 6 (six) hours as needed for moderate pain.   hydroxychloroquine  200 MG tablet Commonly known as: PLAQUENIL  Take 400 mg by mouth daily.   medroxyPROGESTERone  10 MG tablet Commonly known as: PROVERA  Take 1 tablet (10 mg total) by mouth daily for 10 days.   medroxyPROGESTERone  10 MG tablet Commonly known as: PROVERA  Take 2 tablets (20 mg total) by mouth daily.   phenazopyridine  200 MG tablet Commonly known as: Pyridium  Take 1 tablet (200 mg total) by mouth 3 (three) times daily.   predniSONE  5 MG tablet Commonly known as: DELTASONE  Take 5-25 mg by mouth as directed. Taking 25 mg daily currently until on the  02-21-22 will take 20 mg for a week, then titrate down 5 mg each week until taking 5 mg daily.   Prenatal 28-0.8 MG Tabs Take 1 tablet by mouth daily.   Saphnelo 300 MG/2ML Soln injection Generic drug: anifrolumab-fnia Inject 300 mg into the vein every 30 (thirty) days.   zinc  sulfate (50mg  elemental zinc ) 220 (50 Zn) MG capsule Take 1 capsule (220 mg total) by mouth daily.       Mitzie Molly, SNM

## 2024-02-16 NOTE — MAU Note (Signed)
 AZURE BUDNICK is a 28 y.o. at [redacted]w[redacted]d here in MAU reporting: went to the bathroom this and noticed bleeding when she wiped. States she had a BM this am and declines having to strain. Pt wearing panty liner currently and has not noticed any more bleeding. Denies any urinary symptoms. Lower mid abdominal cramping and also goes back and forth between R and L side. Denies any unusual vaginal discharge, odor or pain. Last recent sexual intercourse 2-3 days ago. Denies taking any medication for cramping.   First OB appointment 02/27/24  Onset of complaint: today Pain score: 4 Vitals:   02/16/24 1054  BP: 123/81  Pulse: 72  Resp: 17  Temp: 98 F (36.7 C)  SpO2: 100%     FHT:n/a Lab orders placed from triage:

## 2024-02-17 LAB — GC/CHLAMYDIA PROBE AMP (~~LOC~~) NOT AT ARMC
Chlamydia: NEGATIVE
Comment: NEGATIVE
Comment: NORMAL
Neisseria Gonorrhea: NEGATIVE

## 2024-02-27 ENCOUNTER — Other Ambulatory Visit (INDEPENDENT_AMBULATORY_CARE_PROVIDER_SITE_OTHER): Payer: Self-pay

## 2024-02-27 ENCOUNTER — Ambulatory Visit: Admitting: *Deleted

## 2024-02-27 ENCOUNTER — Other Ambulatory Visit: Payer: Self-pay | Admitting: Obstetrics and Gynecology

## 2024-02-27 VITALS — BP 114/80 | HR 64 | Wt 168.6 lb

## 2024-02-27 DIAGNOSIS — O0990 Supervision of high risk pregnancy, unspecified, unspecified trimester: Secondary | ICD-10-CM | POA: Insufficient documentation

## 2024-02-27 DIAGNOSIS — Z3A08 8 weeks gestation of pregnancy: Secondary | ICD-10-CM

## 2024-02-27 DIAGNOSIS — Z3481 Encounter for supervision of other normal pregnancy, first trimester: Secondary | ICD-10-CM | POA: Diagnosis not present

## 2024-02-27 DIAGNOSIS — Z1331 Encounter for screening for depression: Secondary | ICD-10-CM

## 2024-02-27 DIAGNOSIS — Z362 Encounter for other antenatal screening follow-up: Secondary | ICD-10-CM

## 2024-02-27 DIAGNOSIS — O0991 Supervision of high risk pregnancy, unspecified, first trimester: Secondary | ICD-10-CM

## 2024-02-27 DIAGNOSIS — M329 Systemic lupus erythematosus, unspecified: Secondary | ICD-10-CM

## 2024-02-27 MED ORDER — ASPIRIN 81 MG PO TBEC: 81.0000 mg | DELAYED_RELEASE_TABLET | Freq: Every day | ORAL | 2 refills | Status: DC

## 2024-02-27 MED ORDER — BLOOD PRESSURE KIT DEVI: 1.0000 | 0 refills | Status: DC

## 2024-02-27 MED ORDER — PREPLUS 27-1 MG PO TABS: 1.0000 | ORAL_TABLET | Freq: Every day | ORAL | 13 refills | Status: DC

## 2024-02-27 NOTE — Patient Instructions (Signed)

## 2024-02-27 NOTE — Progress Notes (Signed)
 New OB Intake  I connected with Kelly [name]  on 02/27/24 at  8:15 AM EDT by In PersonVisit and verified that I am speaking with the correct person using two identifiers. Nurse is located at [facility] and pt is located at [place].  I discussed the limitations, risks, security and privacy concerns of performing an evaluation and management service by telephone and the availability of in person appointments. I also discussed with the patient that there may be a patient responsible charge related to this service. The patient expressed understanding and agreed to proceed.  I explained I am completing New OB Intake today. We discussed EDD of Not found.. Pt is G2P0101. I reviewed her allergies, medications and Medical/Surgical/OB history.    Patient Active Problem List   Diagnosis Date Noted   Amenorrhea 08/23/2022   COVID-19 virus infection 04/26/2022   Cellulitis and abscess of leg 04/24/2022   Chronic anticoagulation 04/24/2022   Normocytic anemia 04/24/2022   Pre-eclampsia, postpartum 03/31/2019   Status post cesarean section 03/16/2019   Gonorrhea affecting pregnancy in third trimester 03/15/2019   Hydronephrosis of right kidney 02/24/2019   Iron deficiency anemia during pregnancy, antepartum 10/14/2018   Systemic lupus erythematosus (HCC) 06/04/2012     Concerns addressed today  Delivery Plans Plans to deliver at [facility]. Discussed the nature of our practice with multiple providers including residents and students. Due to the size of the practice, the delivering provider may not be the same as those providing prenatal care.   Patient is not a candidate for water birth.  MyChart/Babyscripts MyChart access verified. I explained pt will have some visits in office and some virtually. Babyscripts instructions given and order placed. Patient verifies receipt of registration text/e-mail. Account successfully created and app downloaded. If patient is a candidate for Optimized  scheduling, add to sticky note.   Blood Pressure Cuff/Weight Scale Blood pressure cuff ordered for patient to pick-up from [employer]. Explained after first prenatal appt pt will check weekly and document in Babyscripts. Patient does not have weight scale; patient may purchase if they desire to track weight weekly in Babyscripts.  Anatomy US  Explained first scheduled US  will be around 19 weeks. Anatomy US  scheduled for TBD at TBD.  Is patient a candidate for Babyscripts Optimization? No, due to Risk Factors   First visit review I reviewed new OB appt with patient. Explained pt will be seen by Kelly Daring, NP at first visit. Discussed Kelly [name] genetic screening with patient. Requests Panorama and Horizon.. Routine prenatal labs OB Urine only collected at today's visit.   Last Pap Diagnosis  Date Value Ref Range Status  08/23/2021   Final   - Negative for intraepithelial lesion or malignancy (NILM)    Kelly CHRISTELLA Ober, RN 02/27/2024  8:16 AM

## 2024-03-01 LAB — URINE CULTURE, OB REFLEX

## 2024-03-01 LAB — CULTURE, OB URINE

## 2024-03-02 ENCOUNTER — Ambulatory Visit: Payer: Self-pay | Admitting: Obstetrics and Gynecology

## 2024-03-02 DIAGNOSIS — Z331 Pregnant state, incidental: Secondary | ICD-10-CM | POA: Diagnosis not present

## 2024-03-02 DIAGNOSIS — M329 Systemic lupus erythematosus, unspecified: Secondary | ICD-10-CM | POA: Diagnosis not present

## 2024-03-21 DIAGNOSIS — Z79899 Other long term (current) drug therapy: Secondary | ICD-10-CM | POA: Diagnosis not present

## 2024-03-21 DIAGNOSIS — M3219 Other organ or system involvement in systemic lupus erythematosus: Secondary | ICD-10-CM | POA: Diagnosis not present

## 2024-03-21 NOTE — Progress Notes (Signed)
 Discharge summary  Treatment:   anifrolumab-fnia (SAPHNELO) 300 mg in sodium chloride  0.9% 100 mL IVPB-Volume (mL): 100   Pre medications: Tylenol  and Claritin       IV: IV placed by this RN, traditional stick. Labs: No labs indicated  COMPLICATIONS: No, Patient tolerated infusion well        Care Summary: Specialty focused assessment: Patient arrived to clinic 2A A&Ox4. Treatment plan reviewed. VSS. IV removed. AVS declined. Education handout declined. Pt instructed to go to the ED or UC if any adverse reactions occur after discharge. Pt states understanding. Pt stable at discharge.  Planned interventions for next visit : Continue plan of care.Next appointment on 04/18/24.  FUNCTIONAL STATUS  Fall risk: no  Use of assistive devices: no   Delay(s): None  Questionnaire completed and reviewed: yes Allergies reviewed : yes Medication reviewed: yes

## 2024-03-30 ENCOUNTER — Other Ambulatory Visit (HOSPITAL_COMMUNITY)
Admission: RE | Admit: 2024-03-30 | Discharge: 2024-03-30 | Disposition: A | Discharge status: Home / Self Care | Source: Ambulatory Visit | Attending: Obstetrics and Gynecology | Admitting: Obstetrics and Gynecology

## 2024-03-30 ENCOUNTER — Ambulatory Visit (INDEPENDENT_AMBULATORY_CARE_PROVIDER_SITE_OTHER): Admitting: Obstetrics and Gynecology

## 2024-03-30 VITALS — BP 112/71 | HR 86 | Wt 162.5 lb

## 2024-03-30 DIAGNOSIS — Z98891 History of uterine scar from previous surgery: Secondary | ICD-10-CM | POA: Diagnosis not present

## 2024-03-30 DIAGNOSIS — Z113 Encounter for screening for infections with a predominantly sexual mode of transmission: Secondary | ICD-10-CM | POA: Insufficient documentation

## 2024-03-30 DIAGNOSIS — O0991 Supervision of high risk pregnancy, unspecified, first trimester: Secondary | ICD-10-CM | POA: Diagnosis not present

## 2024-03-30 DIAGNOSIS — Z3A13 13 weeks gestation of pregnancy: Secondary | ICD-10-CM | POA: Diagnosis not present

## 2024-03-30 DIAGNOSIS — O0990 Supervision of high risk pregnancy, unspecified, unspecified trimester: Secondary | ICD-10-CM | POA: Insufficient documentation

## 2024-03-30 DIAGNOSIS — Z8751 Personal history of pre-term labor: Secondary | ICD-10-CM

## 2024-03-30 DIAGNOSIS — M329 Systemic lupus erythematosus, unspecified: Secondary | ICD-10-CM | POA: Diagnosis not present

## 2024-03-30 NOTE — Progress Notes (Unsigned)
 INITIAL PRENATAL VISIT  Subjective:   Kelly [name] is being seen today for her first obstetrical visit.  She is at [redacted]w[redacted]d gestation by LMP Her obstetrical history is significant for Systemic Lupus Erythematous. Relationship with FOB: {fob:16621}. Patient does intend to breast feed. Pregnancy history fully reviewed.  Patient reports {sx:14538}.  Indications for ASA therapy (per uptodate) One of the following: Previous pregnancy with preeclampsia, especially early onset and with an adverse outcome No Multifetal gestation No Chronic hypertension No Type 1 or 2 diabetes mellitus No Chronic kidney disease No Autoimmune disease (antiphospholipid syndrome, systemic lupus erythematosus) Yes  Objective:    Obstetric History OB History  Gravida Para Term Preterm AB Living  2 1  1  1   SAB IAB Ectopic Multiple Live Births     0 1    # Outcome Date GA Lbr Len/2nd Weight Sex Type Anes PTL Lv  2 Current           1 Preterm 03/16/19 [redacted]w[redacted]d  5 lb 4.1 oz (2.384 kg) M CS-LTranv EPI  LIV    Past Medical History:  Diagnosis Date   Anxiety    Chest discomfort 09/2015   Pt states due to shortness of breath   Cough present for greater than 3 weeks    Late Entry to Babyscripts- April 2020- Social Distancing  11/27/2018   Pt signed up for babyscripts. Will come to office to pick up BP cuff.    Lupus (systemic lupus erythematosus) (HCC)    dx 2010   Pneumonia    2015   Shingles 06/24/2022   Shortness of breath dyspnea 09/2015   changed inhaler with relief    Past Surgical History:  Procedure Laterality Date   BRONCHOSCOPY  11/2013   CESAREAN SECTION N/A 03/16/2019   Procedure: CESAREAN SECTION;  Surgeon: Barbra Lang PARAS, DO;  Location: [facility] LD ORS;  Service: Obstetrics;  Laterality: N/A;   WISDOM TOOTH EXTRACTION  2014    Current Outpatient Medications on File Prior to Visit  Medication Sig Dispense Refill   Blood Pressure Monitoring (BLOOD PRESSURE KIT) DEVI 1 Device by Does not  apply route once a week. 1 each 0   acetaminophen  (TYLENOL ) 500 MG tablet Take 1,000 mg by mouth every 6 (six) hours as needed.     albuterol  (VENTOLIN  HFA) 108 (90 Base) MCG/ACT inhaler Inhale 2 puffs into the lungs as needed for wheezing or shortness of breath.     Anifrolumab-fnia (SAPHNELO) 300 MG/2ML SOLN Inject 300 mg into the vein every 30 (thirty) days.     ascorbic acid  (VITAMIN C ) 500 MG tablet Take 1 tablet (500 mg total) by mouth daily. (Patient not taking: Reported on 03/30/2024)     aspirin  EC 81 MG tablet Take 1 tablet (81 mg total) by mouth daily. Take after 12 weeks 300 tablet 2   azaTHIOprine  (IMURAN ) 50 MG tablet Take 50 mg by mouth daily.     azaTHIOprine  (IMURAN ) 50 MG tablet Take 2 tablets by mouth daily.     cholecalciferol (VITAMIN D3) 25 MCG (1000 UNIT) tablet Take 1,000 Units by mouth once a week.     clomiPHENE  (CLOMID ) 50 MG tablet Take 2 tablets (100 mg total) by mouth daily. Days 5 - 9 of menstrual cycle. (Patient not taking: Reported on 03/30/2024) 10 tablet 0   cyclobenzaprine  (FLEXERIL ) 10 MG tablet Take 1 tablet (10 mg total) by mouth 2 (two) times daily as needed for muscle spasms. (Patient not taking: Reported on  03/30/2024) 20 tablet 0   hydroxychloroquine  (PLAQUENIL ) 200 MG tablet Take 400 mg by mouth daily.     phenazopyridine  (PYRIDIUM ) 200 MG tablet Take 1 tablet (200 mg total) by mouth 3 (three) times daily. (Patient not taking: Reported on 03/30/2024) 6 tablet 0   predniSONE  (DELTASONE ) 5 MG tablet Take 5-25 mg by mouth as directed. Taking 25 mg daily currently until on the  02-21-22 will take 20 mg for a week, then titrate down 5 mg each week until taking 5 mg daily.     Prenatal 28-0.8 MG TABS Take 1 tablet by mouth daily. 30 tablet 12   Prenatal MV & Min w/FA-DHA (PRENATAL ADULT GUMMY/DHA/FA) 0.4-25 MG CHEW Chew 1 tablet by mouth daily. (Patient not taking: Reported on 03/30/2024) 30 tablet 7   zinc  sulfate 220 (50 Zn) MG capsule Take 1 capsule (220 mg total) by  mouth daily. (Patient not taking: Reported on 03/30/2024)     No current facility-administered medications on file prior to visit.    Allergies  Allergen Reactions   Doxycycline  Hives, Rash and Other (See Comments)    Blister patches on skin Bullous eruption, required hospital treatment 01/2022   Methotrexate Other (See Comments)    Other Reaction(s): hairloss    Social History:  reports that she has never smoked. She has never used smokeless tobacco. She reports that she does not currently use alcohol. She reports that she does not currently use drugs after having used the following drugs: Marijuana.  Family History  Problem Relation Age of Onset   Healthy Mother    Healthy Father    Thyroid  disease Maternal Grandmother    Lupus Maternal Grandmother    Diabetes Paternal Grandmother     The following portions of the patient's history were reviewed and updated as appropriate: allergies, current medications, past family history, past medical history, past social history, past surgical history and problem list.  Review of Systems Review of Systems    Physical Exam:  BP 112/71   Pulse 86   Wt 162 lb 8 oz (73.7 kg)   LMP 12/27/2023 (Exact Date)   BMI 29.72 kg/m  CONSTITUTIONAL: Well-developed, well-nourished female in no acute distress.  HENT:  Normocephalic, atraumatic.   EYES: Conjunctivae normal. NECK: Normal range of motion SKIN: Skin is warm and dry. MUSCULOSKELETAL: Normal range of motion NEUROLOGIC: Alert and oriented  PSYCHIATRIC: Normal mood and affect. Normal behavior.  CARDIOVASCULAR: Normal heart rate noted RESPIRATORY: normal effort ABDOMEN: Soft PELVIC:deferred      Movement: Present       Assessment:    Pregnancy: G2P0101  1. Supervision of high risk pregnancy, antepartum (Primary)  - CBC/D/Plt+RPR+Rh+ABO+RubIgG... - Cervicovaginal ancillary only( [place]) - HgB A1c - PANORAMA PRENATAL TEST - [facility] Basic Panel  2. Systemic lupus  erythematosus, unspecified SLE type, unspecified organ involvement status (HCC) Followed by rheumatology, last visit 03/02/24 On Azathioprine  100mg  oral daily, hydroxychloroquine  400mg  once daily, Saphnelo IVPB every 28 days until third trimester, Prednisone  2.5mg  oral daily Planning fetal echo  She has follow up with them in a few weeks Encouraged ASA during pregnancy daily igG/ IgM labs  with them stable   3. History of preterm delivery 4. History of cesarean section PPROM at 35.1,  via c/s   5. [redacted] weeks gestation of pregnancy      Plan:     Initial labs drawn. Prenatal vitamins. Problem list reviewed and updated. Reviewed in detail the nature of the practice with collaborative care between  Genetic screening discussed: NIPS/First trimester screen/Quad/AFP ordered. Role of ultrasound in pregnancy discussed; Anatomy US : ordered. Discussed clinic routines, schedule of care and testing, genetic screening options, involvement of students and residents under the direct supervision of APPs and doctors and presence of female providers. Pt verbalized understanding.  Future Appointments  Date Time Provider Department Center  05/12/2024 10:00 AM [facility] PROVIDER 1 [facility]  05/12/2024 10:30 AM [facility] US1 [facility]    Delores Nidia CROME, FNP

## 2024-03-30 NOTE — Progress Notes (Unsigned)
 Pt presents for new ob. Pt states that she has her pap 08/24 at her pcp. Pt has no questions or concerns at this time.

## 2024-03-31 LAB — CBC/D/PLT+RPR+RH+ABO+RUBIGG...
Antibody Screen: NEGATIVE
Basophils Absolute: 0 x10E3/uL (ref 0.0–0.2)
Basos: 0 %
EOS (ABSOLUTE): 0.1 x10E3/uL (ref 0.0–0.4)
Eos: 2 %
HCV Ab: NONREACTIVE
HIV Screen 4th Generation wRfx: NONREACTIVE
Hematocrit: 34 % (ref 34.0–46.6)
Hemoglobin: 11.2 g/dL (ref 11.1–15.9)
Hepatitis B Surface Ag: NEGATIVE
Immature Grans (Abs): 0 x10E3/uL (ref 0.0–0.1)
Immature Granulocytes: 0 %
Lymphocytes Absolute: 1.3 x10E3/uL (ref 0.7–3.1)
Lymphs: 22 %
MCH: 31.5 pg (ref 26.6–33.0)
MCHC: 32.9 g/dL (ref 31.5–35.7)
MCV: 96 fL (ref 79–97)
Monocytes Absolute: 0.4 x10E3/uL (ref 0.1–0.9)
Monocytes: 7 %
Neutrophils Absolute: 4 x10E3/uL (ref 1.4–7.0)
Neutrophils: 69 %
Platelets: 268 x10E3/uL (ref 150–450)
RBC: 3.56 x10E6/uL — ABNORMAL LOW (ref 3.77–5.28)
RDW: 13.7 % (ref 11.7–15.4)
RPR Ser Ql: NONREACTIVE
Rh Factor: POSITIVE
Rubella Antibodies, IGG: 16.8 {index} (ref >0.99)
WBC: 5.9 x10E3/uL (ref 3.4–10.8)

## 2024-03-31 LAB — HEMOGLOBIN A1C
Est. average glucose Bld gHb Est-mCnc: 97 mg/dL
Hgb A1c MFr Bld: 5 % (ref 4.8–5.6)

## 2024-03-31 LAB — CERVICOVAGINAL ANCILLARY ONLY
Bacterial Vaginitis (gardnerella): POSITIVE — AB
Candida Glabrata: NEGATIVE
Candida Vaginitis: NEGATIVE
Chlamydia: NEGATIVE
Comment: NEGATIVE
Comment: NEGATIVE
Comment: NEGATIVE
Comment: NEGATIVE
Comment: NEGATIVE
Comment: NORMAL
Neisseria Gonorrhea: NEGATIVE
Trichomonas: NEGATIVE

## 2024-03-31 LAB — HCV INTERPRETATION

## 2024-04-01 ENCOUNTER — Ambulatory Visit: Payer: Self-pay | Admitting: Obstetrics and Gynecology

## 2024-04-01 DIAGNOSIS — B9689 Other specified bacterial agents as the cause of diseases classified elsewhere: Secondary | ICD-10-CM

## 2024-04-01 DIAGNOSIS — O0990 Supervision of high risk pregnancy, unspecified, unspecified trimester: Secondary | ICD-10-CM

## 2024-04-01 MED ORDER — METRONIDAZOLE 500 MG PO TABS: 500.0000 mg | ORAL_TABLET | Freq: Two times a day (BID) | ORAL | 0 refills | Status: AC

## 2024-04-05 LAB — PANORAMA PRENATAL TEST FULL PANEL:PANORAMA TEST PLUS 5 ADDITIONAL MICRODELETIONS: FETAL FRACTION: 11.8

## 2024-04-07 LAB — HORIZON CUSTOM: REPORT SUMMARY: NEGATIVE

## 2024-04-13 DIAGNOSIS — M329 Systemic lupus erythematosus, unspecified: Secondary | ICD-10-CM | POA: Diagnosis not present

## 2024-04-24 DIAGNOSIS — Z3A17 17 weeks gestation of pregnancy: Secondary | ICD-10-CM | POA: Diagnosis not present

## 2024-04-24 DIAGNOSIS — O99891 Other specified diseases and conditions complicating pregnancy: Secondary | ICD-10-CM | POA: Diagnosis not present

## 2024-04-24 DIAGNOSIS — M329 Systemic lupus erythematosus, unspecified: Secondary | ICD-10-CM | POA: Diagnosis not present

## 2024-05-01 DIAGNOSIS — Z8759 Personal history of other complications of pregnancy, childbirth and the puerperium: Secondary | ICD-10-CM | POA: Insufficient documentation

## 2024-05-01 DIAGNOSIS — O09899 Supervision of other high risk pregnancies, unspecified trimester: Secondary | ICD-10-CM | POA: Insufficient documentation

## 2024-05-05 ENCOUNTER — Encounter: Payer: Self-pay | Admitting: Obstetrics and Gynecology

## 2024-05-05 ENCOUNTER — Ambulatory Visit (INDEPENDENT_AMBULATORY_CARE_PROVIDER_SITE_OTHER): Admitting: Obstetrics and Gynecology

## 2024-05-05 VITALS — Wt 169.2 lb

## 2024-05-05 DIAGNOSIS — O0990 Supervision of high risk pregnancy, unspecified, unspecified trimester: Secondary | ICD-10-CM | POA: Diagnosis not present

## 2024-05-05 DIAGNOSIS — O09899 Supervision of other high risk pregnancies, unspecified trimester: Secondary | ICD-10-CM | POA: Diagnosis not present

## 2024-05-05 DIAGNOSIS — M329 Systemic lupus erythematosus, unspecified: Secondary | ICD-10-CM

## 2024-05-05 DIAGNOSIS — B9689 Other specified bacterial agents as the cause of diseases classified elsewhere: Secondary | ICD-10-CM

## 2024-05-05 DIAGNOSIS — N760 Acute vaginitis: Secondary | ICD-10-CM

## 2024-05-05 DIAGNOSIS — O99891 Other specified diseases and conditions complicating pregnancy: Secondary | ICD-10-CM | POA: Insufficient documentation

## 2024-05-05 DIAGNOSIS — Z98891 History of uterine scar from previous surgery: Secondary | ICD-10-CM

## 2024-05-05 MED ORDER — METRONIDAZOLE 0.75 % VA GEL: 1.0000 | Freq: Every day | VAGINAL | 1 refills | Status: DC

## 2024-05-05 NOTE — Progress Notes (Signed)
 Pt presents for ROB visit. Pt c/o intermittent sharp pain in the vagina for 1 week.

## 2024-05-05 NOTE — Progress Notes (Signed)
   PRENATAL VISIT NOTE  Subjective:  Kelly [name] is a 28 y.o. G2P0101 at [redacted]w[redacted]d being seen today for ongoing prenatal care.  She is currently monitored for the following issues for this high-risk pregnancy and has Systemic lupus erythematosus (HCC); Iron deficiency anemia during pregnancy, antepartum; History of cesarean section; History of postpartum pre-eclampsia; Chronic anticoagulation; Normocytic anemia; Supervision of high risk pregnancy, antepartum; History of preterm premature rupture of membranes (PPROM); History of preterm delivery, currently pregnant; and Systemic lupus erythematosus affecting pregnancy (HCC) on their problem list.  Patient reports no complaints.  Contractions: Not present. Vag. Bleeding: None.  Movement: Present. Denies leaking of fluid.  Reports she never took treatment for BV that was prescribed last month but is interested in it now. Also reports she got in touch with her PCP and her pap was insufficient. Requests pap here but defers to next visit.  The following portions of the patient's history were reviewed and updated as appropriate: allergies, current medications, past family history, past medical history, past social history, past surgical history and problem list.   Objective:   Vitals:   05/05/24 1611  Weight: 169 lb 3.2 oz (76.7 kg)   Body mass index is 30.95 kg/m. Total weight gain: 9 lb 3.2 oz (4.173 kg)   Fetal Status: Fetal Heart Rate (bpm): 150   Movement: Present     General:  Alert, oriented and cooperative. Patient is in no acute distress.  Skin: Skin is warm and dry. No rash noted.   Cardiovascular: Normal heart rate noted  Respiratory: Normal respiratory effort, no problems with respiration noted  Abdomen: Soft, gravid, appropriate for gestational age.  Pain/Pressure: Absent     Pelvic: Cervical exam deferred        Extremities: Normal range of motion.  Edema: None  Mental Status: Normal mood and affect. Normal behavior. Normal  judgment and thought content.   Assessment and Plan:  Pregnancy: G2P0101 at [redacted]w[redacted]d 1. Supervision of high risk pregnancy, antepartum (Primary) Anticipatory guidance Anatomy ultrasound next week Prefers pap next visit  2. Systemic lupus erythematosus affecting pregnancy (HCC) Continue aspirin , azathioprine , hydroxychloroquine  and prednisone  S/p rheumatology visit on 8/18, next appt 10/6 S/p fetal echo on 8/29 with plan for fetal echo q 2 weeks due to antibodies  3. History of cesarean section Interested in TOLAC, will review more at future visits  4. History of preterm delivery, currently pregnant No signs/symptoms currently  5. Bacterial vaginosis - metroNIDAZOLE  (METROGEL ) 0.75 % vaginal gel; Place 1 Applicatorful vaginally at bedtime. Apply one applicatorful to vagina at bedtime for 5 days  Dispense: 70 g; Refill: 1   Preterm labor symptoms and general obstetric precautions including but not limited to vaginal bleeding, contractions, leaking of fluid and fetal movement were reviewed in detail with the patient. Please refer to After Visit Summary for other counseling recommendations.   Return in about 4 weeks (around 06/02/2024).  Future Appointments  Date Time Provider Department Center  05/12/2024 10:00 AM [facility] PROVIDER 1 [facility]  05/12/2024 10:30 AM [facility] US1 [facility]    Rollo ONEIDA Bring, MD

## 2024-05-08 LAB — AFP, SERUM, OPEN SPINA BIFIDA
AFP MoM: 1
AFP Value: 44.2 ng/mL
Gest. Age on Collection Date: 18 wk
Maternal Age At EDD: 29.5 a
OSBR Risk 1 IN: 10000
Test Results:: NEGATIVE
Weight: 169 [lb_av]

## 2024-05-11 ENCOUNTER — Ambulatory Visit: Payer: Self-pay | Admitting: Obstetrics and Gynecology

## 2024-05-11 DIAGNOSIS — O0990 Supervision of high risk pregnancy, unspecified, unspecified trimester: Secondary | ICD-10-CM

## 2024-05-12 ENCOUNTER — Ambulatory Visit (HOSPITAL_BASED_OUTPATIENT_CLINIC_OR_DEPARTMENT_OTHER)

## 2024-05-12 ENCOUNTER — Ambulatory Visit: Attending: Obstetrics and Gynecology | Admitting: Maternal & Fetal Medicine

## 2024-05-12 ENCOUNTER — Other Ambulatory Visit: Payer: Self-pay | Admitting: *Deleted

## 2024-05-12 VITALS — BP 110/75

## 2024-05-12 DIAGNOSIS — O09292 Supervision of pregnancy with other poor reproductive or obstetric history, second trimester: Secondary | ICD-10-CM | POA: Diagnosis not present

## 2024-05-12 DIAGNOSIS — O34219 Maternal care for unspecified type scar from previous cesarean delivery: Secondary | ICD-10-CM | POA: Insufficient documentation

## 2024-05-12 DIAGNOSIS — Z3A19 19 weeks gestation of pregnancy: Secondary | ICD-10-CM

## 2024-05-12 DIAGNOSIS — O09899 Supervision of other high risk pregnancies, unspecified trimester: Secondary | ICD-10-CM | POA: Diagnosis not present

## 2024-05-12 DIAGNOSIS — O358XX0 Maternal care for other (suspected) fetal abnormality and damage, not applicable or unspecified: Secondary | ICD-10-CM | POA: Diagnosis not present

## 2024-05-12 DIAGNOSIS — O26892 Other specified pregnancy related conditions, second trimester: Secondary | ICD-10-CM | POA: Diagnosis not present

## 2024-05-12 DIAGNOSIS — Z363 Encounter for antenatal screening for malformations: Secondary | ICD-10-CM | POA: Insufficient documentation

## 2024-05-12 DIAGNOSIS — Z8759 Personal history of other complications of pregnancy, childbirth and the puerperium: Secondary | ICD-10-CM | POA: Diagnosis not present

## 2024-05-12 DIAGNOSIS — O09212 Supervision of pregnancy with history of pre-term labor, second trimester: Secondary | ICD-10-CM | POA: Insufficient documentation

## 2024-05-12 DIAGNOSIS — Z3A18 18 weeks gestation of pregnancy: Secondary | ICD-10-CM | POA: Diagnosis not present

## 2024-05-12 DIAGNOSIS — M329 Systemic lupus erythematosus, unspecified: Secondary | ICD-10-CM

## 2024-05-12 DIAGNOSIS — O0990 Supervision of high risk pregnancy, unspecified, unspecified trimester: Secondary | ICD-10-CM

## 2024-05-12 DIAGNOSIS — O99891 Other specified diseases and conditions complicating pregnancy: Secondary | ICD-10-CM

## 2024-05-12 DIAGNOSIS — O99112 Other diseases of the blood and blood-forming organs and certain disorders involving the immune mechanism complicating pregnancy, second trimester: Secondary | ICD-10-CM

## 2024-05-12 DIAGNOSIS — O35BXX0 Maternal care for other (suspected) fetal abnormality and damage, fetal cardiac anomalies, not applicable or unspecified: Secondary | ICD-10-CM

## 2024-05-12 NOTE — Progress Notes (Signed)
 Patient information  Patient Name: Kelly [name]  Patient MRN:   [id number]  Referring practice: MFM Referring Provider: [facility]  Problem List   Patient Active Problem List   Diagnosis Date Noted   Systemic lupus erythematosus affecting pregnancy (HCC) 05/05/2024   History of preterm premature rupture of membranes (PPROM) 05/01/2024   History of preterm delivery, currently pregnant 05/01/2024   Supervision of high risk pregnancy, antepartum 02/27/2024   Chronic anticoagulation 04/24/2022   Normocytic anemia 04/24/2022   History of postpartum pre-eclampsia 03/31/2019   History of cesarean section 03/16/2019   Iron deficiency anemia during pregnancy, antepartum 10/14/2018   Systemic lupus erythematosus (HCC) 06/04/2012   Maternal Fetal medicine Consult  Kelly [name] is a 28 y.o. G2P0101 at [redacted]w[redacted]d here for ultrasound and consultation. [facility] Kelly [name] is doing well today with no acute concerns. Today we focused on the following:   History of possible elevated antiphospholipid antibody The patient reports that in 2023 she was admitted to the hospital for developing a bullous drug rash in the setting of doxycycline  usage.  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  She had a skin biopsy that showed fibrin deposit and hematology recommended Lovenox .  She also started infusions of  Anifrolumab  300 mg infusions q28 days and has done well.  The patient has a history of what was thought to be on Pt was noted to have persistently elevated anti-beta 2 glycoprotein 1 IgA of 87 on 02/16/22, and was 81 on 01/12/2022.  It was repeated again and was normal on 03/02/2024 along with her other antiphospholipid antibodies.  At this time she has had no clinical history of deep venous thrombosis and her rheumatologist does not think she has antiphospholipid antibody syndrome.  I agree with this assessment since the patient has no adverse obstetric history, her labs are now  negative and she has no history of DVT. I discussed that she does not need Lovenox  during pregnancy send she does not have any additional risk factors other than lupus.  She is very active and understands the signs and symptoms to watch for any clot formation.  If she were to have a cesarean delivery then 10 days of Lovenox  is recommended.  Lupus in pregnancy The patient was diagnosed with lupus around age 28.  Outside of pregnancy she takes Plaquenil , Saphnelo , Imuran  and prednisone .  During the pregnancy she has stopped the Saphnelo , but it was recommended by her rheumatologist to continue to prevent flaring.  The patient discussed her concerns with her rheumatologist and after joint decision-making they decided to discontinue the Saphnelo .  She was on this because she developed a lower extremity ulcers.  Since becoming pregnant these ulcers have not formed.  Her dose of prednisone  is 2.5 mg day.  She will need stress dose steroids if her dose of prednisone  becomes greater than 20 mg per day for over 3 weeks prior to birth.   I discussed the clinical implications and prenatal course for patients with systemic lupus erythematosus (SLE) in pregnancy.  I specifically discussed the possibility of increased risk of pregnancy complications such as pregnancy loss, VTE, preeclampsia, growth restriction, preterm birth, thrombocytopenia, lupus flare and congenital SLE.  She the patient reports that she does not have any renal or pulmonary complications from her lupus.  She is compliant with Plaquenil  and her other medications.  Due to positive SSA and SSB antibodies she is scheduled for serial fetal echocardiograms.  We discussed the  potential for congenital neonatal lupus, congential heart block and the need to notify the pediatricians after birth regarding this possibility.  I reassured the patient that in general patients with this during pregnancy can have positive maternal and fetal outcomes with increased  surveillance through growth ultrasounds and antenatal test.  The patient will continue to follow-up with her neurologist and monitor for any flares.  Flares should be treated with continuation of Plaquenil  with the addition of prednisone  under the guidance of her rheumatologist.   Counseling points The implications and management of lupus in pregnancy was discussed in detail with the patient today.   Pregnant women with lupus may be at increased risk for having additional flares during pregnancy.   Patients should take hydroxychloroquine  (HCQ, I.e. Plaquenil ) to reduce the risk of flare and congenital SLE.  SLE is associated with an increased risk of fetal growth restriction, an indicated preterm delivery, and early onset preeclampsia.  It is also associated with neonatal lupus which is caused by transplacental passage of autoantibodies, manifestations such as skin rash, anemia, thrombocytopenia, and hepatitis typically resolve over the first 3 to 6 months of life as maternal antibodies are cleared We will continue to follow her with monthly growth ultrasounds.   Weekly fetal testing should be started at around 32 weeks.   She should continue close follow-up with her rheumatologist throughout her pregnancy. The patient was reassured that as she does not have any long-term sequelae or end organ damage related to lupus, I anticipate that she will most likely have a good and successful pregnancy outcome.   She was reassured that should she develop complications during her pregnancy related to lupus such as preeclampsia, we will be available to help with management recommendations.  The patient also has type 2 diabetes.  She reports that most of her blood sugars are well-controlled.  Due to timing constraints we did not discuss all of the complications associate with type 2 diabetes in pregnancy but I did stressed the importance of proper glycemic control to minimize adverse outcomes.  She will bring her  glucose log to her OB provider.  The patient has a history of P PROM at 35 weeks and had a cesarean delivery due to nonreassuring fetal status. She desires a TOLAC.   The patient had time to ask questions that were answered to her satisfaction.  She verbalized understanding and agrees to proceed with the plan below.  Sonographic findings Single intrauterine pregnancy at 18w 6d. Fetal cardiac activity:  Observed and appears normal. Presentation: Variable. The anatomic structures that were well seen appear normal without evidence of soft markers. The anatomic survey is complete.  Fetal biometry shows the estimated fetal weight at the 22 percentile. Amniotic fluid: Within normal limits.  MVP: 6.9 cm. Placenta: Posterior. Adnexa: No abnormality visualized. Cervical length: 4.3 cm.  There are limitations of prenatal ultrasound such as the inability to detect certain abnormalities due to poor visualization. Various factors such as fetal position, gestational age and maternal body habitus may increase the difficulty in visualizing the fetal anatomy.    Recommendations -EDD should be 10/07/2024 based on  Early Ultrasound  (02/16/24).  The patient has irregular cycles ranging between 26 and 29 days therefore the early ultrasound should be used to date her pregnancy -Serial growth ultrasounds every 4-6 weeks until delivery -Antenatal testing to start around 32 weeks  -Continue to follow-up with rheumatology as scheduled.  Continue azathioprine , hydroxychloroquine , prednisone .  Also recommend continuing Saphnelo  but due to the  limitations in the data the patient has declined this medication.  She has no new development of ulcers at this time and knows to contact her rheumatologist if she has any concerns. -Continue diabetes care with OB provider -A single fetal echocardiogram was recommended for patients with positive SSA and SSB antibodies or with lupus.  The patient is scheduled for serial fetal  echoes.  I discussed to have her discussed the  necessity of these ultrasounds since recent data suggests serial fetal echocardiograms do not provide additional benefit and if fetal heart block is identified it is too late to typically treat. - She will need stress dose steroids if her dose of prednisone  becomes greater than 20 mg per day for over 3 weeks prior to birth. - Delivery will likely be around 37 weeks or sooner if indicated. - Lovenox  is recommended if the patient requires a cesarean delivery due to this being an additional risk factor for blood clot formation in the setting of lupus  Review of Systems: A review of systems was performed and was negative except per HPI   Vitals and Physical Exam    05/12/2024   10:31 AM 05/05/2024    4:11 PM 03/30/2024    3:18 PM  Vitals with BMI  Weight  169 lbs 3 oz 162 lbs 8 oz  Systolic 110  112  Diastolic 75  71  Pulse   86    Sitting comfortably on the sonogram table Nonlabored breathing Normal rate and rhythm Abdomen is nontender  Past pregnancies OB History  Gravida Para Term Preterm AB Living  2 1  1  1   SAB IAB Ectopic Multiple Live Births     0 1    # Outcome Date GA Lbr Len/2nd Weight Sex Type Anes PTL Lv  2 Current           1 Preterm 03/16/19 [redacted]w[redacted]d  5 lb 4.1 oz (2.384 kg) M CS-LTranv EPI  LIV     I spent 60 minutes reviewing the patients chart, including labs and images as well as counseling the patient about her medical conditions. Greater than 50% of the time was spent in direct face-to-face patient counseling.  Delora Smaller  MFM, [facility]   05/12/2024  12:25 PM

## 2024-05-13 DIAGNOSIS — M5489 Other dorsalgia: Secondary | ICD-10-CM | POA: Diagnosis not present

## 2024-05-18 DIAGNOSIS — O288 Other abnormal findings on antenatal screening of mother: Secondary | ICD-10-CM | POA: Diagnosis not present

## 2024-05-18 DIAGNOSIS — Z3A20 20 weeks gestation of pregnancy: Secondary | ICD-10-CM | POA: Diagnosis not present

## 2024-05-18 DIAGNOSIS — M329 Systemic lupus erythematosus, unspecified: Secondary | ICD-10-CM | POA: Diagnosis not present

## 2024-05-28 ENCOUNTER — Emergency Department (HOSPITAL_BASED_OUTPATIENT_CLINIC_OR_DEPARTMENT_OTHER)
Admission: EM | Admit: 2024-05-28 | Discharge: 2024-05-28 | Disposition: A | Discharge status: Home / Self Care | Attending: Emergency Medicine | Admitting: Emergency Medicine

## 2024-05-28 ENCOUNTER — Other Ambulatory Visit: Payer: Self-pay

## 2024-05-28 ENCOUNTER — Encounter (HOSPITAL_BASED_OUTPATIENT_CLINIC_OR_DEPARTMENT_OTHER): Payer: Self-pay

## 2024-05-28 DIAGNOSIS — Z3A21 21 weeks gestation of pregnancy: Secondary | ICD-10-CM | POA: Insufficient documentation

## 2024-05-28 DIAGNOSIS — O368120 Decreased fetal movements, second trimester, not applicable or unspecified: Secondary | ICD-10-CM | POA: Diagnosis not present

## 2024-05-28 DIAGNOSIS — Z7982 Long term (current) use of aspirin: Secondary | ICD-10-CM | POA: Diagnosis not present

## 2024-05-28 DIAGNOSIS — O368121 Decreased fetal movements, second trimester, fetus 1: Secondary | ICD-10-CM

## 2024-05-28 NOTE — ED Triage Notes (Signed)
 Pt reports that she is [redacted] weeks pregnant. Pt reports that baby movement has decreased. Pt states that she wanted baby to be checked to make sure everything is ok.

## 2024-05-28 NOTE — ED Notes (Addendum)
 ED Provider at bedside.

## 2024-05-28 NOTE — Discharge Instructions (Signed)
 Your fetal heart rate on today's visit was 155.  Please call your OB/GYN for further follow-up.  If you experience any cramping, bleeding you will need to go to MAU for further workup.

## 2024-05-28 NOTE — ED Provider Notes (Signed)
 [name] [facility] Provider Note   CSN: [id number] Arrival date & time: 05/28/24  1743     Patient presents with: Decreased Fetal Movement   Kelly [name] is a 28 y.o. female.   28 year old female G2P1 currently 21 weeks and 6 days pregnant presents to the ED with a chief complaint of decreased fetal movement for 3 days.  Patient reports that she is to feel baby doing cart wheels , over the past 3 days she has not felt baby girl moving as much.  She did have a anatomy ultrasound without any abnormal findings.  She reports she has not had any bleeding, no cramping, no urinary symptoms, no other complaints.  The history is provided by the patient.       Prior to Admission medications   Medication Sig Start Date End Date Taking? Authorizing Provider  acetaminophen  (TYLENOL ) 500 MG tablet Take 1,000 mg by mouth every 6 (six) hours as needed.    [provider]  albuterol  (VENTOLIN  HFA) 108 (90 Base) MCG/ACT inhaler Inhale 2 puffs into the lungs as needed for wheezing or shortness of breath.    [provider]  aspirin  EC 81 MG tablet Take 1 tablet (81 mg total) by mouth daily. Take after 12 weeks 02/27/24   Zina Jerilynn LABOR, MD  azaTHIOprine  (IMURAN ) 50 MG tablet Take 50 mg by mouth daily. 01/30/22   [provider]  azaTHIOprine  (IMURAN ) 50 MG tablet Take 2 tablets by mouth daily. 09/04/23   [provider]  Blood Pressure Monitoring (BLOOD PRESSURE KIT) DEVI 1 Device by Does not apply route once a week. 02/27/24   Zina Jerilynn LABOR, MD  cholecalciferol (VITAMIN D3) 25 MCG (1000 UNIT) tablet Take 1,000 Units by mouth once a week.    [provider]  hydroxychloroquine  (PLAQUENIL ) 200 MG tablet Take 400 mg by mouth daily.    [provider]  metroNIDAZOLE  (METROGEL ) 0.75 % vaginal gel Place 1 Applicatorful vaginally at bedtime. Apply one applicatorful to vagina at bedtime for 5 days 05/05/24   Abigail Rollo DASEN, MD  phenazopyridine  (PYRIDIUM ) 200 MG tablet Take 1 tablet (200 mg total) by mouth 3 (three) times daily. Patient not taking: Reported on 05/05/2024 07/31/23   Eveline Lynwood MATSU, MD  predniSONE  (DELTASONE ) 5 MG tablet Take 5-25 mg by mouth as directed. Taking 25 mg daily currently until on the  02-21-22 will take 20 mg for a week, then titrate down 5 mg each week until taking 5 mg daily. 07/04/21   [provider]  Prenatal 28-0.8 MG TABS Take 1 tablet by mouth daily. 01/30/24   Eveline Lynwood MATSU, MD    Allergies: Doxycycline  and Methotrexate    Review of Systems  Constitutional:  Negative for fever.  Respiratory:  Negative for shortness of breath.   Cardiovascular:  Negative for chest pain.  Gastrointestinal:  Negative for abdominal pain, nausea and vomiting.  Genitourinary:  Negative for vaginal bleeding.  All other systems reviewed and are negative.   Updated Vital Signs BP 109/68 (BP Location: Left Arm)   Pulse 92   Temp 98.1 F (36.7 C) (Oral)   Resp 20   Ht 5' 2 (1.575 m)   Wt 76.2 kg   LMP 12/27/2023 (Exact Date)   SpO2 99%   BMI 30.73 kg/m   Physical Exam Vitals and nursing note reviewed.  Constitutional:      Appearance: Normal appearance.  HENT:     Head:  Normocephalic and atraumatic.     Mouth/Throat:     Mouth: Mucous membranes are moist.  Cardiovascular:     Rate and Rhythm: Normal rate.  Pulmonary:     Effort: Pulmonary effort is normal.     Breath sounds: No wheezing.  Abdominal:     General: Abdomen is flat.     Palpations: Abdomen is soft.     Tenderness: There is no abdominal tenderness.     Comments: Abdomen is, baby's position along the right lower area.  Fetal heart 155  Musculoskeletal:     Cervical back: Normal range of motion and neck supple.  Skin:    General: Skin is warm and dry.  Neurological:     Mental Status: She is alert and oriented to person, place, and time.     (all labs ordered are listed, but only abnormal  results are displayed) Labs Reviewed - No data to display  EKG: None  Radiology: No results found.   Procedures   Medications Ordered in the ED - No data to display                                  Medical Decision Making   Patient presented to the ED with complaints of decrease in fetal movement over the past 3 days.  Reports that she used to feel her baby do cart wheels and utero, has not felt her move as much.  Now feels her more so around her back.  She has not had any bleeding, no cramping, no fevers, has not been sick.  Evaluation there is no tenderness to her belly palpable fundus slightly above the umbilicus, fetal heart tones obtained by me reassuring fetal heart rate of   FHRT 155  She again has not had any urinary symptoms, fevers, vaginal bleeding, lower abdominal cramping.  Had a normal anatomy scan at 20 weeks.  Is followed by her OB/[facility]N at [facility].  Did discuss this case with Dr. Jerilynn Buddle on-call, who recommended reassurance, continue follow-up with OB/GYN at scheduled appointment.  Patient was indicated of this discussion.  She is hemodynamically stable for discharge.   Portions of this note were generated with Scientist, clinical (histocompatibility and immunogenetics). Dictation errors may occur despite best attempts at proofreading.   Final diagnoses:  Decreased fetal movements in second trimester, fetus 1 of multiple gestation    ED Discharge Orders     None          [name], [name] 05/28/24 [name] [name], MD 05/29/24 [phone]

## 2024-06-01 DIAGNOSIS — M329 Systemic lupus erythematosus, unspecified: Secondary | ICD-10-CM | POA: Diagnosis not present

## 2024-06-01 NOTE — Progress Notes (Signed)
 Autoantibody Testing:  Recent Labs    06/09/09 1531 06/29/09 1608 06/29/09 1610 07/13/09 1114 02/25/13 1240 04/08/13 1058 03/02/24 1735 04/13/24 1708  RF <20  --   --   --  <20  --   --   --   ANA1 1:2560*  --   --   --   --   --   --   --   ANAPATRN1 Homogeneous  --   --   --   --   --   --   --   ANA2 1:2560*  --   --   --   --   --   --   --   ANAPATRN2 Speckled  --   --   --   --   --   --   --   SMAB  --   --  POSITIVE*  --   --    < > >8.0*  --   RNPAB  --   --  POSITIVE*  --   --    < > 6.1*  --   ANTISMRNP  --   --   --   --   --    < > >8.0*  --   ROAB  --   --  POSITIVE*  --   --   --   --   --   ANTISSA  --   --   --   --   --    < > 6.6*  --   LAAB  --   --  POSITIVE*  --   --   --   --   --   ANTISSB  --   --   --   --   --    < > <0.2  --   DSDNA  --    < >  --    < > 477*   < > 67* 74*  C3  --   --  89   < > 68*   < >  --  105  C4  --   --  21   < > 9*   < >  --  27  ANTICHROM  --   --   --   --   --    < > >8.0*  --   ANTIRIBOSOMP  --   --   --   --   --    < > >8.0*  --   ANTICENTROB  --   --   --   --   --    < > <0.2  --   ANTISCL70  --   --   --   --   --    < > 0.7  --   ANTIJO1  --   --   --   --   --    < > <0.2  --    < > = values in this interval not displayed.   Antiphospholipid Antibody testing (Note: >40 indicates clinical positive antibody) Recent Labs    06/29/09 1350 06/29/09 1608 07/13/09 1114 10/21/12 1255 10/21/12 1629 10/15/13 1541 02/15/21 1255 01/12/22 0937 02/19/22 1926 03/23/22 1457 03/02/24 1735  CRDLPNIGG  --  21*  --   --  11 6 11 3   --  4.0 2.3  CRDLPNIGM  --  11  --   --   --  6 5 3   --  <1.5 <1.5  CRDLPNIGA  --  1  --   --   --  1 9 7   --  4.5 6.0  B2GP1IGG  --   --  7  --  3 1 3 2 2  3.6 2.8  B2GP1IGM  --   --  2  --  5 1 1 1 1  <1.5 <1.5  B2GP1IGA  --   --  17  --  12 24* 87* 81* 92* 3.6 4.9  DRVVT 0.85  --   --  0.80  --   --  0.93 0.89  --  0.99 0.74   Labs within the last 12 months: (Note: dsDNA and ENA titers  changed August 2023 making comparison of levels before and after this switch unreliable) Recent Labs    03/23/22 1457 04/13/22 1513 01/11/23 1415 05/31/23 1230 04/13/24 1654 04/13/24 1708  PLT 292   < > 287   < >  --  252  HGB 12.2   < > 12.1   < >  --  10.9*  WBC 6.4   < > 4.4   < >  --  6.6  NEUTCT 4.0   < > 2.5   < >  --  4.4  LYMPHCT 1.5   < > 1.4   < >  --  1.3  CREATININE 0.8   < > 0.7   < >  --  0.5  TPCRERATIO  --    < > 90   < > 69  --   RBCUA  --    < > 1   < > 2  --   WBCUA  --    < > 1   < > 1  --   C3  --    < > 101   < >  --  105  C4  --    < > 22   < >  --  27  DSDNA  --    < > 118*   < >  --  74*  ALT 21   < > 12   < >  --  11  AST 15   < > 15   < >  --  16  ALKPHOS 39  --   --   --   --   --   CRP 0.64*   < > 0.75  --   --   --    < > = values in this interval not displayed.    Hydroxychloroquine  serum level: our data suggests goal range is between ~250-550 Kelly [name], Lupus [facility] 2022)  Hydroxychloroquine  - Serum  Date Value Ref Range Status  03/02/2024 236.1 ng/mL Final    Comment:    For suppressive treatment of malaria, suggested serum concentrations should be >10 ng/mL.  For systemic lupus erythematosus, proposed serum target concentrations should be > or =500 ng/mL.  The serum concentration should be interpreted in the context of the patient's clinical response and may provide useful information in patients showing poor response, noncompliance, or adverse effects. Concentrations less than 106 ng/mL have been associated with noncompliance.    Azathioprine  metabolite levels  Azathioprine  effectiveness and risk for low white blood cells:  6-TGN  Date Value Ref Range Status  03/02/2024 199 230 - 400 pmole/8X10E8 RBC pmole/8X10E8 RBC Final    Comment:       Azathioprine  risk for liver toxicity:  6-MMPN  Date Value Ref Range Status  03/02/2024 <267 < 5700 pmole/8X10E8 RBC pmole/8X10E8 RBC Final  OB History  Gravida Para Term Preterm  AB Living  1 0 0 0 0 0  SAB IAB Ectopic Molar Multiple Live Births  0 0 0 0 0 0    # Outcome Date GA Lbr Len/2nd Weight Sex Type Anes PTL Lv  1 Current             Assessment/Plan:   Kelly [name] is a 28 y.o. female is seen in follow up for SLE  and pregnancy. She is [redacted]w[redacted]d pregnant with Patient's last menstrual period was 12/27/2023. and Estimated Date of Delivery: 10/02/24.  SLE dx age 15 characterized by +ANA 2560, +Smith, +RNP, +Ro, +La, +dsDNA, C3/C4 hypocomplementemia, +coombs, acute cutaneous rash, alopecia, arthritis, LAD.  Seen by [facility] in childhood.  Previous treatments include prednisone , HCQ, MTX (09/2012 for arthritis & rash), MMF (06/2015), Benlysta  (helped).  Saphnelo  since summer 2023 with great improvement in leg ulcers.  Assessment & Plan Systemic lupus erythematosus with cutaneous flare Cutaneous flare with red, raised, itchy blotches on arms and slightly on chest. No joint pain, fever, or other systemic symptoms. Not using steroid creams, applies Aquaphor, Vaseline, and cocoa butter. Not taking Saphnelo  as agreed upon. - Prescribe clobetasol cream for topical application to affected areas. - Order lupus labs to be drawn at [facility] lab during her visit for the fetal echocardiogram. - Maintain current medication regimen, including azathioprine  100mg , hydroxychloroquine  400mg  and prednisone  2.5mg  a day. - Avoid increasing prednisone  dose; prefer topical treatment for localized effect.   [redacted] weeks pregnant with a female fetus. No significant issues with the pregnancy. Experienced decreased fetal movement over the weekend, resolved after evaluation. Shortness of breath likely due to pregnancy. Significant heartburn reported. - Continue prenatal care and monitoring. - Continue aspirin  162mg  to decrease the risk for preeclampsia.  - Advise on over-the-counter Prilosec or Pepcid for heartburn management. - Ensure follow-up with pediatric cardiologist for fetal  echocardiogram on October 9th. - Advise on safe consumption of fresh [place] sandwiches to avoid listeria risk.  Heartburn in pregnancy Severe heartburn daily. Using Tums for relief, recalls using Prilosec in previous pregnancy. - Recommend over-the-counter Prilosec or Pepcid for heartburn management.  Recording duration: 21 minutes     01/10/2024   11:30 AM 03/02/2024    3:40 PM 04/13/2024    3:40 PM  Lupus Activity Measures  T1 SLE PGA SCORE 0.5 0.5 0.0  T2 SLE PGA SCORE 0.0 1.25 0.75    Disease Assessment Swollen Joints: 0          Tender Joints: 0  Disease Activity:      0= no activity   100 = severe activity 25  Disease Activity Assessment: (none, mild, mod, sev) Mild   Type 1: PGA (physician's global assessment) score today: 0.75.  Current lupus activity includes rash on her arms and chest.  LFA REAL: Mucocutaneous PGA: 1.0 Neuropsych PGA: 0.0 Musculoskeletal PGA: 0.0 Cardiorespiratory PGA: 0.0 Renal PGA: 0.0 Hematologic PGA: 0.0 Other PGA:          Clinical SELENA-SLEDAI 30 day, clinical scoring: Completed: 06/01/2024 Are clinical SLEDAI findings present: yes: rash (2)  Type 2: PGA score today: 0.25.  Pregnancy Management:  Continue aspirin  162mg , chewable version, every evening to decrease the risk of preeclampsia and preterm birth.  Continue prenatal multivitamin.  Follow-up: Follow up with Kelly FORBES Downer, MD in about 7 weeks (around 07/20/2024).  Registry for pregnancy & rheumatic disease: Questionnaires sent via email  This note has been created using automated tools  and reviewed for accuracy by MEGAN E CLOWSE. Time dedicated to this encounter today (excluding procedures) included:     5 minutes in pre-visit chart review and preparation    18 minutes talking with & examining the patient    10 minutes completing orders and this note    0 minutes talking with the fellow or other staff member about the patient    0 minutes reviewing & reporting back labs  after the visit I spent 33 minutes today in the care of this patient.    This video encounter was conducted with the patient's (or proxy's) verbal consent via secure, interactive audio and video telecommunications while in clinic/office/hospital.  The patient (or proxy) was instructed to have this encounter in a suitably private space and to only have persons present to whom they give permission to participate. In addition, patient identity was confirmed by use of name plus an additional identifier.  07/10/20 E&M) This visit was coded based on time. I spent a total of 33 minutes in both face-to-face and non-face-to-face activities for this visit on the date of this encounter.   Subjective:   Estimated Date of Delivery: 10/02/24 and currently [redacted]w[redacted]d.   Pregnancy is going well - having a girl.  All is going well. She went to the ER this week for lower fetal movements - but the doppler was fine and she's been active since.    She is feeling good - she's been getting small blotches on her arms that she thinks might be lupus-related - a little itchy but not problematic - red and raised. Back of her upper arms and on her chest.  Looks a bit angry on her arms.  This feels and looks very different from the severe leg ulcers she had a few years ago - she isn't having this at all now.    History of Present Illness Kelly [name] is a 28 year old female with lupus who presents for a routine follow-up during her pregnancy.  Pregnancy status and fetal movement - Currently [redacted] weeks pregnant - Experienced decreased fetal movement over the weekend, prompting evaluation at a medical center - Fetal heart Doppler confirmed normal fetal activity - Fetal movement has since returned to baseline and is active  Cutaneous lupus manifestations - Red, raised, pruritic blotches primarily on the arms - Similar rash beginning to appear on the chest - No current leg ulcers; previous ulcers began as itchy white dots that  became moist and spread - No use of steroid cream; applies Aquaphor, Vaseline, and cocoa butter to affected areas - No calamine lotion required for current symptoms  Musculoskeletal and systemic lupus symptoms - No joint pain - No fevers - No infections - No mouth or nose ulcers - No hair loss  Ocular symptoms - Dry eyes managed with extra contact lens solution and eye drops  Respiratory and gastrointestinal symptoms - Shortness of breath with stair climbing, attributed to pregnancy - Daily heartburn managed with Tums - Previous use of Prilosec during prior pregnancy  Renal and endocrine history clarification - No history of nephritis - No history of type 2 diabetes - No kidney or bladder issues  Mood and general well-being - Mood is good  Arthritis: none Fevers or Infections: No Fatigue:  With pregnancy.    Dry mouth: No Dry eyes:Yes - very dry.  She wears contacts and they bother her often - her contacts will fall out.  Using extra saline or eye drops.   Mouth  ulcers: No  Alopecia:  No Rash:  Yes  Chest pain: No  Shortness of breath: Yes  mild SOB with exertion from pregnancy Heartburn or stomach pain: Yes GERD with pregnancy - it has been really bad all the time.  Taking Tums.She was on prilosec before and feels like she needs it.    Urinary pain or frequency:  No Mood:  mood is great.   Medications: Medication chart below is accurate (taking vs not taking) per my discussion with the patient today.  Prior to Admission medications  Medication Sig Taking? Last Dose  aspirin  81 MG chewable tablet Take 2 tablets (162 mg total) by mouth once daily Yes   azaTHIOprine  (IMURAN ) 50 mg tablet Take 2 tablets (100 mg total) by mouth once daily Yes   cholecalciferol (VITAMIN D3) 1000 unit capsule Take 2 capsules (2,000 Units total) by mouth once daily Start after completing high dose weekly Vitamin D  50000 IU (1 capsule once weekly x 8 weeks) Yes   hydroxychloroquine  (PLAQUENIL )  200 mg tablet Take 2 tablets (400 mg total) by mouth once daily Yes   PNV 19/iron ps,heme/folic/dha (PRENATAL MV & MIN ORAL) Take 1 tablet by mouth once daily Yes   predniSONE  (DELTASONE ) 2.5 MG tablet Take 1 tablet (2.5 mg total) by mouth once daily for 180 days Yes   albuterol  MDI, PROVENTIL , VENTOLIN , PROAIR , HFA 90 mcg/actuation inhaler Inhale 2 inhalations into the lungs every 6 (six) hours as needed    clobetasoL (TEMOVATE) 0.05 % cream Apply topically 2 (two) times daily     Objective:   Physical Exam:  This is a televisit, so no visual physical exam was possible.  In general, she sounds to be in no acute distress.  Psych: Affect is within normal limits.  Joints: no swelling.  Skin: deep red rashes on the backs of her arms.

## 2024-06-02 ENCOUNTER — Encounter: Admitting: Obstetrics and Gynecology

## 2024-06-04 DIAGNOSIS — O288 Other abnormal findings on antenatal screening of mother: Secondary | ICD-10-CM | POA: Diagnosis not present

## 2024-06-04 DIAGNOSIS — M329 Systemic lupus erythematosus, unspecified: Secondary | ICD-10-CM | POA: Diagnosis not present

## 2024-06-04 DIAGNOSIS — Z3A22 22 weeks gestation of pregnancy: Secondary | ICD-10-CM | POA: Diagnosis not present

## 2024-06-10 ENCOUNTER — Encounter: Payer: Self-pay | Admitting: Obstetrics and Gynecology

## 2024-06-10 ENCOUNTER — Other Ambulatory Visit (HOSPITAL_COMMUNITY)
Admission: RE | Admit: 2024-06-10 | Discharge: 2024-06-10 | Disposition: A | Discharge status: Home / Self Care | Source: Ambulatory Visit | Attending: Obstetrics and Gynecology | Admitting: Obstetrics and Gynecology

## 2024-06-10 ENCOUNTER — Ambulatory Visit (INDEPENDENT_AMBULATORY_CARE_PROVIDER_SITE_OTHER): Admitting: Obstetrics and Gynecology

## 2024-06-10 VITALS — BP 117/86 | HR 92 | Wt 173.2 lb

## 2024-06-10 DIAGNOSIS — M329 Systemic lupus erythematosus, unspecified: Secondary | ICD-10-CM

## 2024-06-10 DIAGNOSIS — Z3482 Encounter for supervision of other normal pregnancy, second trimester: Secondary | ICD-10-CM | POA: Insufficient documentation

## 2024-06-10 DIAGNOSIS — Z3A23 23 weeks gestation of pregnancy: Secondary | ICD-10-CM | POA: Insufficient documentation

## 2024-06-10 DIAGNOSIS — O0990 Supervision of high risk pregnancy, unspecified, unspecified trimester: Secondary | ICD-10-CM | POA: Diagnosis not present

## 2024-06-10 DIAGNOSIS — O09899 Supervision of other high risk pregnancies, unspecified trimester: Secondary | ICD-10-CM

## 2024-06-10 DIAGNOSIS — O99891 Other specified diseases and conditions complicating pregnancy: Secondary | ICD-10-CM

## 2024-06-10 DIAGNOSIS — O0992 Supervision of high risk pregnancy, unspecified, second trimester: Secondary | ICD-10-CM | POA: Insufficient documentation

## 2024-06-10 DIAGNOSIS — Z98891 History of uterine scar from previous surgery: Secondary | ICD-10-CM | POA: Diagnosis not present

## 2024-06-10 NOTE — Patient Instructions (Signed)
 Next appointment, nothing to eat or drink after midnight the night before your appointment.  You will be at your appointment for 2 hours that day.

## 2024-06-10 NOTE — Progress Notes (Unsigned)
   PRENATAL VISIT NOTE  Subjective:  Kelly [name] is a 28 y.o. G2P0101 at [redacted]w[redacted]d being seen today for ongoing prenatal care.  She is currently monitored for the following issues for this high-risk pregnancy and has Iron deficiency anemia during pregnancy, antepartum; History of cesarean section; History of postpartum pre-eclampsia; Chronic anticoagulation; Normocytic anemia; Supervision of high risk pregnancy, antepartum; History of preterm premature rupture of membranes (PPROM); History of preterm delivery, currently pregnant; and Systemic lupus erythematosus affecting pregnancy (HCC) on their problem list.  Patient reports .  Contractions: Not present. Vag. Bleeding: None.  Movement: Present. Denies leaking of fluid.   The following portions of the patient's history were reviewed and updated as appropriate: allergies, current medications, past family history, past medical history, past social history, past surgical history and problem list.   Objective:    Vitals:   06/10/24 1524  BP: 117/86  Pulse: 92  Weight: 173 lb 3.2 oz (78.6 kg)    Fetal Status:  Fetal Heart Rate (bpm): 145   Movement: Present    General: Alert, oriented and cooperative. Patient is in no acute distress.  Skin: Skin is warm and dry. No rash noted.   Cardiovascular: Normal heart rate noted  Respiratory: Normal respiratory effort, no problems with respiration noted  Abdomen: Soft, gravid, appropriate for gestational age.  Pain/Pressure: Absent     Pelvic: Pelvic: normal appearing vulva with no masses, tenderness or lesions  VAGINA: normal appearing vagina with normal color and discharge, no lesions  CERVIX: normal appearing cervix without discharge or lesions, no CMT  Thin prep pap is done  HR HPV cotesting    Extremities:  No swelling or varicosities noted   Extremities: Normal range of motion.  Edema: None  Mental Status: Normal mood and affect. Normal behavior. Normal judgment and thought content.    Assessment and Plan:  Pregnancy: G2P0101 at [redacted]w[redacted]d 1. Supervision of high risk pregnancy, antepartum (Primary) BP and FHR normal Doing well, feeling regular movement  Repeat pap today, hx colpo last year, getting records from PCP  2. Systemic lupus erythematosus affecting pregnancy [facility]) S/p rheumatology 10/6 Continue ASA, hydroxychloroquine , azathioprine , prenisone Follow up u/s 10/21  3. History of cesarean section Desires TOLAC  4. History of preterm delivery, currently pregnant No s&s  5. [redacted] weeks gestation of pregnancy Anticipatory guidance regarding GTT and labs next visit, discussed NPO status after midnight    Preterm labor symptoms and general obstetric precautions including but not limited to vaginal bleeding, contractions, leaking of fluid and fetal movement were reviewed in detail with the patient. Please refer to After Visit Summary for other counseling recommendations.   Return in about 4 weeks (around 07/08/2024), or ob visit and tolac consent, for OB VISIT (MD ONLY), 2 hr GTT.  Future Appointments  Date Time Provider Department Center  06/16/2024  3:30 PM [facility] PROVIDER 1 [facility]  06/16/2024  3:45 PM [facility] US4 [facility]  07/08/2024  8:15 AM [facility] None  07/08/2024  8:55 AM Eveline Lynwood MATSU, MD [facility] None  07/14/2024  3:30 PM [facility] PROVIDER 1 [name]  07/14/2024  3:45 PM [facility] US1 [facility]    [name], FNP

## 2024-06-10 NOTE — Progress Notes (Unsigned)
 PAP today.   Pt concerned about taking metrogel . Would like to discuss this with provider.

## 2024-06-12 LAB — CYTOLOGY - PAP: Diagnosis: NEGATIVE

## 2024-06-16 ENCOUNTER — Ambulatory Visit

## 2024-06-16 ENCOUNTER — Ambulatory Visit: Attending: Maternal & Fetal Medicine | Admitting: Obstetrics

## 2024-06-16 ENCOUNTER — Ambulatory Visit: Payer: Self-pay | Admitting: Obstetrics and Gynecology

## 2024-06-16 VITALS — BP 109/76

## 2024-06-16 DIAGNOSIS — D6862 Lupus anticoagulant syndrome: Secondary | ICD-10-CM

## 2024-06-16 DIAGNOSIS — O0990 Supervision of high risk pregnancy, unspecified, unspecified trimester: Secondary | ICD-10-CM

## 2024-06-16 DIAGNOSIS — O99891 Other specified diseases and conditions complicating pregnancy: Secondary | ICD-10-CM | POA: Diagnosis not present

## 2024-06-16 DIAGNOSIS — O09212 Supervision of pregnancy with history of pre-term labor, second trimester: Secondary | ICD-10-CM | POA: Insufficient documentation

## 2024-06-16 DIAGNOSIS — Z7952 Long term (current) use of systemic steroids: Secondary | ICD-10-CM | POA: Insufficient documentation

## 2024-06-16 DIAGNOSIS — Z79624 Long term (current) use of inhibitors of nucleotide synthesis: Secondary | ICD-10-CM | POA: Diagnosis not present

## 2024-06-16 DIAGNOSIS — Z3689 Encounter for other specified antenatal screening: Secondary | ICD-10-CM | POA: Diagnosis not present

## 2024-06-16 DIAGNOSIS — O34219 Maternal care for unspecified type scar from previous cesarean delivery: Secondary | ICD-10-CM

## 2024-06-16 DIAGNOSIS — Z3A23 23 weeks gestation of pregnancy: Secondary | ICD-10-CM | POA: Insufficient documentation

## 2024-06-16 DIAGNOSIS — O09292 Supervision of pregnancy with other poor reproductive or obstetric history, second trimester: Secondary | ICD-10-CM

## 2024-06-16 DIAGNOSIS — O26892 Other specified pregnancy related conditions, second trimester: Secondary | ICD-10-CM | POA: Insufficient documentation

## 2024-06-16 DIAGNOSIS — M329 Systemic lupus erythematosus, unspecified: Secondary | ICD-10-CM

## 2024-06-16 DIAGNOSIS — O321XX0 Maternal care for breech presentation, not applicable or unspecified: Secondary | ICD-10-CM | POA: Insufficient documentation

## 2024-06-16 DIAGNOSIS — O358XX0 Maternal care for other (suspected) fetal abnormality and damage, not applicable or unspecified: Secondary | ICD-10-CM | POA: Diagnosis not present

## 2024-06-16 DIAGNOSIS — Z7982 Long term (current) use of aspirin: Secondary | ICD-10-CM | POA: Insufficient documentation

## 2024-06-16 DIAGNOSIS — Z79631 Long term (current) use of antimetabolite agent: Secondary | ICD-10-CM | POA: Diagnosis not present

## 2024-06-17 NOTE — Progress Notes (Signed)
 MFM Consult Note  Kelly [name] is currently at 23 weeks and 6 days.  She has been followed due to history of lupus.    She is currently treated with prednisone , azathioprine , Plaquenil , and a daily baby aspirin .  She denies any history of thrombosis or kidney issues.  Her most recent P/C ratio was 69 (within normal limits).  Her blood work from July 2025 showed that she had positive SSA antibodies.  Her SSB antibodies were negative.  Due to the positive SSA antibodies, she had a normal fetal echocardiogram with a normal PR interval performed with [facility].  Sonographic findings Single intrauterine pregnancy at 23w 6d.  Fetal cardiac activity:  Observed and appears normal. Presentation: Breech. Interval fetal anatomy appears normal. Fetal biometry shows the estimated fetal weight of 1 pound 5 ounces which measures at the 25th percentile. Amniotic fluid volume: Within normal limits. MVP: 5.93 cm. Placenta: Posterior.  The views of the fetal anatomy visualized today appeared within normal limits.  The limitations of ultrasound in the detection of all anomalies was discussed.  There are limitations of prenatal ultrasound such as the inability to detect certain abnormalities due to poor visualization. Various factors such as fetal position, gestational age and maternal body habitus may increase the difficulty in visualizing the fetal anatomy.    Lupus in pregnancy  The patient was advised to continue taking prednisone , azathioprine , Plaquenil , and a daily baby aspirin .  She was advised that hopefully these medications will decrease the risk of having another lupus flare during her pregnancy.  The increased risk of fetal congenital heart block due to the positive SSA antibodies was discussed.  The increased risk of preeclampsia due to lupus in pregnancy was discussed.  We will continue to follow her closely with growth ultrasounds throughout her pregnancy.    Weekly  fetal testing should be started at 32 weeks.  She will return in 4 weeks for another growth scan.    The patient stated that all of her questions were answered today.  A total of 20 minutes was spent counseling and coordinating the care for this patient.  Greater than 50% of the time was spent in direct face-to-face contact.

## 2024-06-25 DIAGNOSIS — O99891 Other specified diseases and conditions complicating pregnancy: Secondary | ICD-10-CM | POA: Diagnosis not present

## 2024-06-25 DIAGNOSIS — Z23 Encounter for immunization: Secondary | ICD-10-CM | POA: Diagnosis not present

## 2024-06-25 DIAGNOSIS — M329 Systemic lupus erythematosus, unspecified: Secondary | ICD-10-CM | POA: Diagnosis not present

## 2024-07-03 ENCOUNTER — Other Ambulatory Visit

## 2024-07-03 ENCOUNTER — Ambulatory Visit

## 2024-07-03 ENCOUNTER — Encounter: Payer: Self-pay | Admitting: Obstetrics & Gynecology

## 2024-07-03 VITALS — BP 124/85 | HR 92 | Wt 174.4 lb

## 2024-07-03 DIAGNOSIS — Z0130 Encounter for examination of blood pressure without abnormal findings: Secondary | ICD-10-CM

## 2024-07-03 NOTE — Progress Notes (Signed)
..  Subjective:  Kelly [name] is a [id number] here for BP check after having an elevated reading of 126/91 on babyscripts.  She is 26.[redacted] weeks gestation.    Hypertension ROS: Patient denies headache, visual changes, and dizziness   Objective:  BP 124/85   Pulse 92   Wt 174 lb 6.4 oz (79.1 kg)   LMP 12/27/2023 (Exact Date)   BMI 31.90 kg/m   Appearance alert, well appearing, and in no distress.  Assessment:   Blood Pressure well controlled.   Plan:  Current treatment plan is effective, no change in therapy.. Advised to report abnormal symptoms or got to mau. Continue to monitor BP at home. Next appt on 11/12   Kelly JONETTA Crigler, RN

## 2024-07-06 ENCOUNTER — Inpatient Hospital Stay (HOSPITAL_COMMUNITY)
Admission: AD | Admit: 2024-07-06 | Discharge: 2024-07-06 | Disposition: A | Discharge status: Home / Self Care | Attending: Obstetrics and Gynecology | Admitting: Obstetrics and Gynecology

## 2024-07-06 ENCOUNTER — Other Ambulatory Visit: Payer: Self-pay

## 2024-07-06 ENCOUNTER — Encounter (HOSPITAL_COMMUNITY): Payer: Self-pay | Admitting: Obstetrics and Gynecology

## 2024-07-06 DIAGNOSIS — Z8679 Personal history of other diseases of the circulatory system: Secondary | ICD-10-CM | POA: Diagnosis not present

## 2024-07-06 DIAGNOSIS — R011 Cardiac murmur, unspecified: Secondary | ICD-10-CM | POA: Insufficient documentation

## 2024-07-06 DIAGNOSIS — M329 Systemic lupus erythematosus, unspecified: Secondary | ICD-10-CM

## 2024-07-06 DIAGNOSIS — Z01812 Encounter for preprocedural laboratory examination: Secondary | ICD-10-CM | POA: Insufficient documentation

## 2024-07-06 DIAGNOSIS — O99891 Other specified diseases and conditions complicating pregnancy: Secondary | ICD-10-CM

## 2024-07-06 DIAGNOSIS — M7989 Other specified soft tissue disorders: Secondary | ICD-10-CM | POA: Diagnosis not present

## 2024-07-06 DIAGNOSIS — R519 Headache, unspecified: Secondary | ICD-10-CM

## 2024-07-06 DIAGNOSIS — O26892 Other specified pregnancy related conditions, second trimester: Secondary | ICD-10-CM | POA: Diagnosis not present

## 2024-07-06 DIAGNOSIS — O34219 Maternal care for unspecified type scar from previous cesarean delivery: Secondary | ICD-10-CM | POA: Diagnosis not present

## 2024-07-06 DIAGNOSIS — Z3A26 26 weeks gestation of pregnancy: Secondary | ICD-10-CM | POA: Diagnosis not present

## 2024-07-06 DIAGNOSIS — O0990 Supervision of high risk pregnancy, unspecified, unspecified trimester: Secondary | ICD-10-CM

## 2024-07-06 DIAGNOSIS — R030 Elevated blood-pressure reading, without diagnosis of hypertension: Secondary | ICD-10-CM

## 2024-07-06 DIAGNOSIS — Z8759 Personal history of other complications of pregnancy, childbirth and the puerperium: Secondary | ICD-10-CM

## 2024-07-06 LAB — COMPREHENSIVE METABOLIC PANEL WITH GFR
ALT: 14 U/L (ref 0–44)
AST: 16 U/L (ref 15–41)
Albumin: 2.7 g/dL — ABNORMAL LOW (ref 3.5–5.0)
Alkaline Phosphatase: 36 U/L — ABNORMAL LOW (ref 38–126)
Anion gap: 8 (ref 5–15)
BUN: 7 mg/dL (ref 6–20)
CO2: 23 mmol/L (ref 22–32)
Calcium: 8.4 mg/dL — ABNORMAL LOW (ref 8.9–10.3)
Chloride: 105 mmol/L (ref 98–111)
Creatinine, Ser: 0.55 mg/dL (ref 0.44–1.00)
GFR, Estimated: >60 mL/min (ref >60)
Glucose, Bld: 79 mg/dL (ref 70–99)
Potassium: 3.3 mmol/L — ABNORMAL LOW (ref 3.5–5.1)
Sodium: 136 mmol/L (ref 135–145)
Total Bilirubin: 0.7 mg/dL (ref 0.0–1.2)
Total Protein: 6.4 g/dL — ABNORMAL LOW (ref 6.5–8.1)

## 2024-07-06 LAB — URINALYSIS, ROUTINE W REFLEX MICROSCOPIC
Bacteria, UA: NONE SEEN
Bilirubin Urine: NEGATIVE
Glucose, UA: NEGATIVE mg/dL
Hgb urine dipstick: NEGATIVE
Ketones, ur: NEGATIVE mg/dL
Leukocytes,Ua: NEGATIVE
Nitrite: NEGATIVE
Protein, ur: 30 mg/dL — AB
Specific Gravity, Urine: 1.024 (ref 1.005–1.030)
pH: 6 (ref 5.0–8.0)

## 2024-07-06 LAB — CBC
HCT: 29.6 % — ABNORMAL LOW (ref 36.0–46.0)
Hemoglobin: 10.3 g/dL — ABNORMAL LOW (ref 12.0–15.0)
MCH: 31.9 pg (ref 26.0–34.0)
MCHC: 34.8 g/dL (ref 30.0–36.0)
MCV: 91.6 fL (ref 80.0–100.0)
Platelets: 213 K/uL (ref 150–400)
RBC: 3.23 MIL/uL — ABNORMAL LOW (ref 3.87–5.11)
RDW: 15.2 % (ref 11.5–15.5)
WBC: 3.3 K/uL — ABNORMAL LOW (ref 4.0–10.5)
nRBC: 0 % (ref 0.0–0.2)

## 2024-07-06 LAB — PROTEIN / CREATININE RATIO, URINE
Creatinine, Urine: 181 mg/dL
Protein Creatinine Ratio: 0.18 mg/mg{creat} — ABNORMAL HIGH (ref 0.00–0.15)
Total Protein, Urine: 33 mg/dL

## 2024-07-06 MED ORDER — CYCLOBENZAPRINE HCL 5 MG PO TABS
10.0000 mg | ORAL_TABLET | Freq: Once | ORAL | Status: AC
  Administered 2024-07-06: 10 mg via ORAL
  Filled 2024-07-06: qty 2

## 2024-07-06 MED ORDER — ACETAMINOPHEN-CAFFEINE 500-65 MG PO TABS
2.0000 | ORAL_TABLET | Freq: Once | ORAL | Status: AC
  Administered 2024-07-06: 2 via ORAL
  Filled 2024-07-06: qty 2

## 2024-07-06 NOTE — MAU Provider Note (Signed)
 History     [id number]  Arrival date and time: 07/06/24 1712    Chief Complaint  Patient presents with   Headache   Hypertension   Leg Swelling    Right foot     HPI Kelly [name] is a 28 y.o. at [redacted]w[redacted]d by 6 wk US  with PMHx notable for lupus, one prior cesarean, PP pre-e, who presents for elevated BP's and intermittent headaches as well as swelling in her R foot.   Patient reports she has been checking her BP since last week A few days ago she started to get higher readings at home, went to [facility] for in person check but it was normal Over the weekend has been having headaches, they resolve with tylenol  Reports she has a headache right now Denies vision changes, chest pain, shortness of breath, right upper quadrant pain Has not taken anything for her head today  Patient reports intermittent swelling that comes and goes in her R foot for the past two years She reports it is swelling up more often than usual but otherwise is at baseline  No vaginal bleeding or leaking fluid Not sure if she's having contractions Fetal movement is normal   B/Positive/-- (08/04 1610)  OB History     Gravida  2   Para  1   Term      Preterm  1   AB      Living  1      SAB      IAB      Ectopic      Multiple  0   Live Births  1           Past Medical History:  Diagnosis Date   Anxiety    Cellulitis and abscess of leg 04/24/2022   Chest discomfort 09/2015   Pt states due to shortness of breath   Cough present for greater than 3 weeks    COVID-19 virus infection 04/26/2022   Gonorrhea affecting pregnancy in third trimester 03/15/2019   Treated 03/13/19     Hydronephrosis of right kidney 02/24/2019   Mild on 02/24/2019     Late Entry to [employer]- April 2020- Social Distancing  11/27/2018   Pt signed up for [employer]. Will come to office to pick up BP cuff.    Lupus (systemic lupus erythematosus) (HCC)    dx 2010   Pneumonia    2015   Shingles  06/24/2022   Shortness of breath dyspnea 09/2015   changed inhaler with relief    Past Surgical History:  Procedure Laterality Date   BRONCHOSCOPY  11/2013   CESAREAN SECTION N/A 03/16/2019   Procedure: CESAREAN SECTION;  Surgeon: Barbra Lang PARAS, DO;  Location: [facility] LD [facility]S;  Service: Obstetrics;  Laterality: N/A;   WISDOM TOOTH EXTRACTION  2014    Family History  Problem Relation Age of Onset   Healthy Mother    Healthy Father    Thyroid  disease Maternal Grandmother    Lupus Maternal Grandmother    Diabetes Paternal Grandmother    Asthma Neg Hx    Cancer Neg Hx    Hypertension Neg Hx    Heart disease Neg Hx     Social History   Socioeconomic History   Marital status: Married    Spouse name: Not on file   Number of children: Not on file   Years of education: Not on file   Highest education level: Not on file  Occupational History   Not on  file  Tobacco Use   Smoking status: Never   Smokeless tobacco: Never  Vaping Use   Vaping status: Never Used  Substance and Sexual Activity   Alcohol use: Not Currently    Comment: occ   Drug use: Not Currently    Types: Marijuana    Comment: last use sep 2024   Sexual activity: Yes    Partners: Male    Birth control/protection: None  Other Topics Concern   Not on file  Social History Narrative   Not on file   Social Drivers of Health   Financial Resource Strain: Medium Risk (03/13/2023)   Received from [facility]   Overall Financial Resource Strain (CARDIA)    Difficulty of Paying Living Expenses: Somewhat hard  Food Insecurity: No Food Insecurity (07/06/2024)   Hunger Vital Sign    Worried About Running Out of Food in the Last Year: Never true    Ran Out of Food in the Last Year: Never true  Transportation Needs: No Transportation Needs (07/06/2024)   PRAPARE - Administrator, Civil Service (Medical): No    Lack of Transportation (Non-Medical): No  Physical Activity: Unknown (03/13/2023)   Received  from [facility]   Exercise Vital Sign    On average, how many days per week do you engage in moderate to strenuous exercise (like a brisk walk)?: 0 days    Minutes of Exercise per Session: Not on file  Stress: No Stress Concern Present (03/13/2023)   Received from [facility] - Occupational Stress Questionnaire    Feeling of Stress : Not at all  Social Connections: Socially Integrated (03/13/2023)   Received from [facility]   Social Network    How would you rate your social network (family, work, friends)?: Good participation with social networks  Intimate Partner Violence: Not At Risk (07/06/2024)   Humiliation, Afraid, Rape, and Kick questionnaire    Fear of Current or Ex-Partner: No    Emotionally Abused: No    Physically Abused: No    Sexually Abused: No    Allergies  Allergen Reactions   Doxycycline  Hives, Rash and Other (See Comments)    Blister patches on skin Bullous eruption, required hospital treatment 01/2022   Methotrexate Other (See Comments)    Other Reaction(s): hairloss    No current facility-administered medications on file prior to encounter.   Current Outpatient Medications on File Prior to Encounter  Medication Sig Dispense Refill   acetaminophen  (TYLENOL ) 500 MG tablet Take 1,000 mg by mouth every 6 (six) hours as needed.     albuterol  (VENTOLIN  HFA) 108 (90 Base) MCG/ACT inhaler Inhale 2 puffs into the lungs as needed for wheezing or shortness of breath.     aspirin  EC 81 MG tablet Take 1 tablet (81 mg total) by mouth daily. Take after 12 weeks 300 tablet 2   azaTHIOprine  (IMURAN ) 50 MG tablet Take 50 mg by mouth daily.     cholecalciferol (VITAMIN D3) 25 MCG (1000 UNIT) tablet Take 1,000 Units by mouth once a week.     hydroxychloroquine  (PLAQUENIL ) 200 MG tablet Take 400 mg by mouth daily.     predniSONE  (DELTASONE ) 5 MG tablet Take 5-25 mg by mouth as directed. Taking 25 mg daily currently until on the   02-21-22 will take 20 mg for a week, then titrate down 5 mg each week until taking 5 mg daily.     Prenatal 28-0.8 MG TABS Take  1 tablet by mouth daily. 30 tablet 12   azaTHIOprine  (IMURAN ) 50 MG tablet Take 2 tablets by mouth daily. (Patient not taking: Reported on 07/06/2024)     Blood Pressure Monitoring (BLOOD PRESSURE KIT) DEVI 1 Device by Does not apply route once a week. 1 each 0     ROS Pertinent positives and negative per HPI, all others reviewed and negative  Physical Exam   BP 112/81   Pulse 78   Temp 98.5 F (36.9 C) (Oral)   Resp 16   Ht 5' 2 (1.575 m)   Wt 80.3 kg   LMP 12/27/2023 (Exact Date)   SpO2 100%   BMI 32.37 kg/m   Patient Vitals for the past 24 hrs:  BP Temp Temp src Pulse Resp SpO2 Height Weight  07/06/24 2015 112/81 -- -- 78 -- 100 % -- --  07/06/24 2000 112/79 -- -- 80 -- 100 % -- --  07/06/24 1948 107/80 -- -- 84 -- 100 % -- --  07/06/24 1930 124/85 -- -- 86 -- 100 % -- --  07/06/24 1850 -- -- -- -- -- 100 % -- --  07/06/24 1848 (!) 129/93 -- -- 89 -- 100 % -- --  07/06/24 1842 -- -- -- -- -- 100 % -- --  07/06/24 1840 -- -- -- -- -- 100 % -- --  07/06/24 1835 -- -- -- -- -- 100 % -- --  07/06/24 1831 116/81 -- -- 82 -- -- -- --  07/06/24 1830 -- -- -- -- -- 100 % -- --  07/06/24 1825 -- -- -- -- -- 100 % -- --  07/06/24 1820 -- -- -- -- -- 100 % -- --  07/06/24 1816 (!) 129/92 98.5 F (36.9 C) Oral 83 16 99 % -- --  07/06/24 1815 -- -- -- -- -- 99 % -- --  07/06/24 1810 -- -- -- -- -- 100 % -- --  07/06/24 1749 (!) 129/90 98.3 F (36.8 C) Oral 89 15 100 % 5' 2 (1.575 m) 80.3 kg    Physical Exam Vitals reviewed.  Constitutional:      General: She is not in acute distress.    Appearance: She is well-developed. She is not diaphoretic.  Eyes:     General: No scleral icterus. Pulmonary:     Effort: Pulmonary effort is normal. No respiratory distress.  Abdominal:     General: There is no distension.     Palpations: Abdomen is soft.      Tenderness: There is no abdominal tenderness. There is no guarding or rebound.  Musculoskeletal:        General: No swelling.     Comments: No lower extremity edema present  Skin:    General: Skin is warm and dry.  Neurological:     Mental Status: She is alert.     Coordination: Coordination normal.      Cervical Exam    Bedside Ultrasound Not performed.  My interpretation: n/a  FHT Baseline: 135 bpm Variability: Good {> 6 bpm) Accelerations: Reactive Decelerations: Variables Uterine activity: flat Cat: II with some variable in the beginning, later with no decels and overall very reassuring with moderate variability and accels appropriate for gestational age  Labs Results for orders placed or performed during the hospital encounter of 07/06/24 (from the past 24 hours)  Urinalysis, Routine w reflex microscopic -Urine, Clean Catch     Status: Abnormal   Collection Time: 07/06/24  6:00 PM  Result Value Ref  Range   Color, Urine AMBER (A) YELLOW   APPearance HAZY (A) CLEAR   Specific Gravity, Urine 1.024 1.005 - 1.030   pH 6.0 5.0 - 8.0   Glucose, UA NEGATIVE NEGATIVE mg/dL   Hgb urine dipstick NEGATIVE NEGATIVE   Bilirubin Urine NEGATIVE NEGATIVE   Ketones, ur NEGATIVE NEGATIVE mg/dL   Protein, ur 30 (A) NEGATIVE mg/dL   Nitrite NEGATIVE NEGATIVE   Leukocytes,Ua NEGATIVE NEGATIVE   RBC / HPF 0-5 0 - 5 RBC/hpf   WBC, UA 0-5 0 - 5 WBC/hpf   Bacteria, UA NONE SEEN NONE SEEN   Squamous Epithelial / HPF 6-10 0 - 5 /HPF   Mucus PRESENT    Non Squamous Epithelial 0-5 (A) NONE SEEN  Protein / creatinine ratio, urine     Status: Abnormal   Collection Time: 07/06/24  6:07 PM  Result Value Ref Range   Creatinine, Urine 181 mg/dL   Total Protein, Urine 33 mg/dL   Protein Creatinine Ratio 0.18 (H) 0.00 - 0.15 mg/mg[Cre]  CBC     Status: Abnormal   Collection Time: 07/06/24  6:13 PM  Result Value Ref Range   WBC 3.3 (L) 4.0 - 10.5 K/uL   RBC 3.23 (L) 3.87 - 5.11 MIL/uL    Hemoglobin 10.3 (L) 12.0 - 15.0 g/dL   HCT 70.3 (L) 63.9 - 53.9 %   MCV 91.6 80.0 - 100.0 fL   MCH 31.9 26.0 - 34.0 pg   MCHC 34.8 30.0 - 36.0 g/dL   RDW 84.7 88.4 - 84.4 %   Platelets 213 150 - 400 K/uL   nRBC 0.0 0.0 - 0.2 %  Comprehensive metabolic panel with GFR     Status: Abnormal   Collection Time: 07/06/24  6:13 PM  Result Value Ref Range   Sodium 136 135 - 145 mmol/L   Potassium 3.3 (L) 3.5 - 5.1 mmol/L   Chloride 105 98 - 111 mmol/L   CO2 23 22 - 32 mmol/L   Glucose, Bld 79 70 - 99 mg/dL   BUN 7 6 - 20 mg/dL   Creatinine, Ser 9.44 0.44 - 1.00 mg/dL   Calcium  8.4 (L) 8.9 - 10.3 mg/dL   Total Protein 6.4 (L) 6.5 - 8.1 g/dL   Albumin 2.7 (L) 3.5 - 5.0 g/dL   AST 16 15 - 41 U/L   ALT 14 0 - 44 U/L   Alkaline Phosphatase 36 (L) 38 - 126 U/L   Total Bilirubin 0.7 0.0 - 1.2 mg/dL   GFR, Estimated >39 >39 mL/min   Anion gap 8 5 - 15    Imaging No results found.  MAU Course  Procedures Lab Orders         Urinalysis, Routine w reflex microscopic -Urine, Clean Catch         CBC         Comprehensive metabolic panel with GFR         Protein / creatinine ratio, urine    Meds ordered this encounter  Medications   acetaminophen -caffeine (EXCEDRIN TENSION HEADACHE) 500-65 MG per tablet 2 tablet   cyclobenzaprine  (FLEXERIL ) tablet 10 mg   Imaging Orders  No imaging studies ordered today    MDM Moderate (Level 3-4)  Assessment and Plan  #Elevated blood pressure reading without diagnosis of hypertension, Headache in pregnancy #SLE #[redacted] weeks gestation of pregnancy Headache resolved with above intervention. PreE labs without severe features and BP's normalized. At this point not yet meeting criteria for [facility] but at  high risk given history and medical conditions. Has f/u appointment in 2 days at [facility], recheck BP then.   #FWB FHT Cat II due to some early variables but towards the end Cat I with moderate variability and multiple accels, overall appropriate for gestational  age and very reassuring NST: Reactive   Dispo: discharged to home in stable condition    Donnice CHRISTELLA Carolus, MD/MPH 07/06/24 8:50 PM  Allergies as of 07/06/2024       Reactions   Doxycycline  Hives, Rash, Other (See Comments)   Blister patches on skin Bullous eruption, required hospital treatment 01/2022   Methotrexate Other (See Comments)   Other Reaction(s): hairloss        Medication List     TAKE these medications    acetaminophen  500 MG tablet Commonly known as: TYLENOL  Take 1,000 mg by mouth every 6 (six) hours as needed.   albuterol  108 (90 Base) MCG/ACT inhaler Commonly known as: VENTOLIN  HFA Inhale 2 puffs into the lungs as needed for wheezing or shortness of breath.   aspirin  EC 81 MG tablet Take 1 tablet (81 mg total) by mouth daily. Take after 12 weeks   azaTHIOprine  50 MG tablet Commonly known as: IMURAN  Take 50 mg by mouth daily.   azaTHIOprine  50 MG tablet Commonly known as: IMURAN  Take 2 tablets by mouth daily.   Blood Pressure Kit Devi 1 Device by Does not apply route once a week.   cholecalciferol 25 MCG (1000 UNIT) tablet Commonly known as: VITAMIN D3 Take 1,000 Units by mouth once a week.   hydroxychloroquine  200 MG tablet Commonly known as: PLAQUENIL  Take 400 mg by mouth daily.   predniSONE  5 MG tablet Commonly known as: DELTASONE  Take 5-25 mg by mouth as directed. Taking 25 mg daily currently until on the  02-21-22 will take 20 mg for a week, then titrate down 5 mg each week until taking 5 mg daily.   Prenatal 28-0.8 MG Tabs Take 1 tablet by mouth daily.

## 2024-07-06 NOTE — MAU Note (Signed)
 Kelly [name] is a 28 y.o. at [redacted]w[redacted]d here in MAU reporting: having high BP in office and at home, HA present on and off 4 day, - tylenol  last taken yesterday. Reports swollen right foot. She reports +FMs, Denies LOF, VB, regular contractions, blurry vision, or RUQ pain.   LMP: na Onset of complaint: 4 days Pain score: 4/10 Vitals:   07/06/24 1749  BP: (!) 129/90  Pulse: 89  Resp: 15  Temp: 98.3 F (36.8 C)  SpO2: 100%     FHT: to room  Lab orders placed from triage: ua

## 2024-07-08 ENCOUNTER — Ambulatory Visit (INDEPENDENT_AMBULATORY_CARE_PROVIDER_SITE_OTHER): Payer: Self-pay | Admitting: Obstetrics & Gynecology

## 2024-07-08 ENCOUNTER — Other Ambulatory Visit

## 2024-07-08 VITALS — BP 119/81 | HR 94 | Wt 176.0 lb

## 2024-07-08 DIAGNOSIS — Z8759 Personal history of other complications of pregnancy, childbirth and the puerperium: Secondary | ICD-10-CM

## 2024-07-08 DIAGNOSIS — M329 Systemic lupus erythematosus, unspecified: Secondary | ICD-10-CM

## 2024-07-08 DIAGNOSIS — Z98891 History of uterine scar from previous surgery: Secondary | ICD-10-CM

## 2024-07-08 DIAGNOSIS — O09899 Supervision of other high risk pregnancies, unspecified trimester: Secondary | ICD-10-CM

## 2024-07-08 DIAGNOSIS — O0992 Supervision of high risk pregnancy, unspecified, second trimester: Secondary | ICD-10-CM

## 2024-07-08 DIAGNOSIS — O0990 Supervision of high risk pregnancy, unspecified, unspecified trimester: Secondary | ICD-10-CM

## 2024-07-08 DIAGNOSIS — Z3A27 27 weeks gestation of pregnancy: Secondary | ICD-10-CM | POA: Diagnosis not present

## 2024-07-08 DIAGNOSIS — O99891 Other specified diseases and conditions complicating pregnancy: Secondary | ICD-10-CM

## 2024-07-08 DIAGNOSIS — O09892 Supervision of other high risk pregnancies, second trimester: Secondary | ICD-10-CM

## 2024-07-08 NOTE — Patient Instructions (Signed)
 Options for Doula Care in the [place]  As you review your birthing options, consider having a birth doula. A doula is trained to provide support before, during and just after you give birth. There are also postpartum doulas that help you adjust to new parenthood.  While doulas do not provide medical care, they do provide emotional, physical and educational support. A few months before your baby arrives, doulas can help answer questions, ease concerns and help you create and support your birthing plan.    Doulas can help reduce your stress and comfort you and your partner. They can help you cope with labor by helping you use breathing techniques, massage, creative labor positioning, essential oils and affirmations.   Studies show that the benefits of having a doula include:   A more positive birth experience  Fewer requests for pain-relief medication  Less likelihood of cesarean section, commonly called a c-section   Doulas are typically hired via a [employer] account planner between you and the doula. We are happy to provide a list of the most active doulas in the area, all of whom are credentialed by [facility] and will not count as a visitor at your birth.  There are several options for no-cost doula care at our hospital, including:  [facility] Volunteer Doula Program Every [employer] Program A Cure 4 Moms Doula Study (available only at [employer] for Women, [place], [place] and [employer] offices)  For more information on these programs or to receive a list of doulas active in our area, please email [email or web address]

## 2024-07-08 NOTE — Progress Notes (Signed)
 Pt would like to discuss BP readings at home, was recently seen in MAU.

## 2024-07-08 NOTE — Progress Notes (Signed)
 PRENATAL VISIT NOTE  Subjective:  Kelly [name] is a 28 y.o. G2P0101 at [redacted]w[redacted]d being seen today for ongoing prenatal care.  She is currently monitored for the following issues for this high-risk pregnancy and has Iron deficiency anemia during pregnancy, antepartum; History of cesarean section; History of postpartum pre-eclampsia; Chronic anticoagulation; Normocytic anemia; Supervision of high risk pregnancy, antepartum; History of preterm premature rupture of membranes (PPROM); History of preterm delivery, currently pregnant; and Systemic lupus erythematosus affecting pregnancy (HCC) on their problem list. She reports she was recently seen at MAU on 11/10 for elevated blood pressure and persistent headaches. The headaches have since resolved, but she continues to worry about her blood pressure. She reports values around 135/92 with her home monitor, but is concerned these may be inaccurate. Otherwise, she denies any questions or concerns at this time.  Patient reports no bleeding, no contractions, no cramping, no leaking, and elevated blood pressures.  Contractions: Not present. Vag. Bleeding: None.  Movement: Present. Denies leaking of fluid.   The following portions of the patient's history were reviewed and updated as appropriate: allergies, current medications, past family history, past medical history, past social history, past surgical history and problem list.   Objective:   Vitals:   07/08/24 0829  BP: 119/81  Pulse: 94  Weight: 176 lb (79.8 kg)    Fetal Status:  Fetal Heart Rate (bpm): 140   Movement: Present    General: Alert, oriented and cooperative. Patient is in no acute distress.  Skin: Skin is warm and dry. Flat hyperpigmentation on bilateral cheeks, nasolabial folds spared.  Cardiovascular: Normal heart rate noted  Respiratory: Normal respiratory effort, no problems with respiration noted  Abdomen: Soft, gravid, appropriate for gestational age.  Pain/Pressure: Absent      Pelvic: Cervical exam deferred        Extremities: Normal range of motion. No joint pain.    Mental Status: Normal mood and affect. Normal behavior. Normal judgment and thought content.      02/27/2024    9:03 AM 07/12/2022    3:47 PM 06/01/2022   11:27 AM  Depression screen PHQ 2/9  Decreased Interest 0 0 0  Down, Depressed, Hopeless 0 0 0  PHQ - 2 Score 0 0 0  Altered sleeping 0 1 0  Tired, decreased energy 0 1 1  Change in appetite 0 0 0  Feeling bad or failure about yourself  0 0 0  Trouble concentrating 1 0 1  Moving slowly or fidgety/restless 0 0 0  Suicidal thoughts 0 0 0  PHQ-9 Score 1  2  2       Data saved with a previous flowsheet row definition        02/27/2024    9:07 AM 07/12/2022    3:48 PM 06/01/2022   11:27 AM  GAD 7 : Generalized Anxiety Score  Nervous, Anxious, on Edge 0 0 0  Control/stop worrying 1 0 0  Worry too much - different things 1 0 0  Trouble relaxing 0 0 0  Restless 0 1 0  Easily annoyed or irritable 0 0 0  Afraid - awful might happen 0 0 0  Total GAD 7 Score 2 1 0    Assessment and Plan:  Pregnancy: G2P0101 at [redacted]w[redacted]d 1. Supervision of high risk pregnancy, antepartum (Primary) - Prescribed new home blood pressure monitor for comparison. - Glucose Tolerance, 2 Hours w/1 Hour - RPR - CBC - HIV antibody (with reflex) - Tdap vaccine greater than  or equal to 7yo IM - Flu vaccine trivalent PF, 6mos and older(Flulaval,Afluria,Fluarix,Fluzone)  2. [redacted] weeks gestation of pregnancy - Glucose Tolerance, 2 Hours w/1 Hour - RPR  - CBC  - HIV antibody (with reflex) - Tdap vaccine greater than or equal to 7yo IM - Flu vaccine trivalent PF, 6mos and older(Flulaval,Afluria,Fluarix,Fluzone)  3. Systemic lupus erythematosus affecting pregnancy (HCC) - Seeing [facility], no issues, determined to be low risk of fetal heart block, next appointment @ 28 wks  4. History of cesarean section - Counseled patient over risk of repeat caesarian, she  agreed to trial of labor after caesarean.  5. History of preterm delivery, currently pregnant - No signs or symptoms at this time  6. History of preterm premature rupture of membranes (PPROM) - No signs or symptoms at this time  Preterm labor symptoms and general obstetric precautions including but not limited to vaginal bleeding, contractions, leaking of fluid and fetal movement were reviewed in detail with the patient. Please refer to After Visit Summary for other counseling recommendations.   Return in about 2 weeks (around 07/22/2024).  Future Appointments  Date Time Provider Department Center  07/14/2024  3:30 PM [facility] PROVIDER 1 [facility]  07/14/2024  3:45 PM [facility] US1 [facility]    Nunzio DELENA Cena Verma, Medical Student  Attestation of Attending Supervision of Medical Student: Evaluation and management procedures were performed by the medical student under my supervision and collaboration.  I have reviewed the student's note and chart, and I agree with the management and plan.  LYNWOOD SOLOMONS, MD, FACOG Attending Obstetrician & Gynecologist Faculty Practice, [facility]

## 2024-07-09 DIAGNOSIS — O99891 Other specified diseases and conditions complicating pregnancy: Secondary | ICD-10-CM | POA: Diagnosis not present

## 2024-07-09 DIAGNOSIS — M329 Systemic lupus erythematosus, unspecified: Secondary | ICD-10-CM | POA: Diagnosis not present

## 2024-07-09 LAB — CBC
Hematocrit: 30.5 % — ABNORMAL LOW (ref 34.0–46.6)
Hemoglobin: 10 g/dL — ABNORMAL LOW (ref 11.1–15.9)
MCH: 31.9 pg (ref 26.6–33.0)
MCHC: 32.8 g/dL (ref 31.5–35.7)
MCV: 97 fL (ref 79–97)
Platelets: 200 x10E3/uL (ref 150–450)
RBC: 3.13 x10E6/uL — ABNORMAL LOW (ref 3.77–5.28)
RDW: 14.4 % (ref 11.7–15.4)
WBC: 3.3 x10E3/uL — ABNORMAL LOW (ref 3.4–10.8)

## 2024-07-09 LAB — GLUCOSE TOLERANCE, 2 HOURS W/ 1HR
Glucose, 1 hour: 97 mg/dL (ref 70–179)
Glucose, 2 hour: 78 mg/dL (ref 70–152)
Glucose, Fasting: 73 mg/dL (ref 70–91)

## 2024-07-09 LAB — RPR: RPR Ser Ql: NONREACTIVE

## 2024-07-09 LAB — HIV ANTIBODY (ROUTINE TESTING W REFLEX): HIV Screen 4th Generation wRfx: NONREACTIVE

## 2024-07-13 DIAGNOSIS — F4322 Adjustment disorder with anxiety: Secondary | ICD-10-CM | POA: Diagnosis not present

## 2024-07-14 ENCOUNTER — Ambulatory Visit: Attending: Maternal & Fetal Medicine | Admitting: Maternal & Fetal Medicine

## 2024-07-14 ENCOUNTER — Ambulatory Visit

## 2024-07-14 ENCOUNTER — Encounter: Payer: Self-pay | Admitting: Maternal & Fetal Medicine

## 2024-07-14 DIAGNOSIS — O99891 Other specified diseases and conditions complicating pregnancy: Secondary | ICD-10-CM

## 2024-07-14 DIAGNOSIS — Z3A27 27 weeks gestation of pregnancy: Secondary | ICD-10-CM

## 2024-07-14 DIAGNOSIS — D6862 Lupus anticoagulant syndrome: Secondary | ICD-10-CM | POA: Diagnosis not present

## 2024-07-14 DIAGNOSIS — O34211 Maternal care for low transverse scar from previous cesarean delivery: Secondary | ICD-10-CM | POA: Diagnosis not present

## 2024-07-14 DIAGNOSIS — O09292 Supervision of pregnancy with other poor reproductive or obstetric history, second trimester: Secondary | ICD-10-CM | POA: Diagnosis not present

## 2024-07-14 DIAGNOSIS — M329 Systemic lupus erythematosus, unspecified: Secondary | ICD-10-CM | POA: Diagnosis not present

## 2024-07-14 DIAGNOSIS — Z79899 Other long term (current) drug therapy: Secondary | ICD-10-CM | POA: Insufficient documentation

## 2024-07-14 DIAGNOSIS — O0990 Supervision of high risk pregnancy, unspecified, unspecified trimester: Secondary | ICD-10-CM | POA: Diagnosis not present

## 2024-07-14 DIAGNOSIS — O358XX0 Maternal care for other (suspected) fetal abnormality and damage, not applicable or unspecified: Secondary | ICD-10-CM | POA: Insufficient documentation

## 2024-07-14 DIAGNOSIS — O26892 Other specified pregnancy related conditions, second trimester: Secondary | ICD-10-CM

## 2024-07-14 DIAGNOSIS — Z362 Encounter for other antenatal screening follow-up: Secondary | ICD-10-CM | POA: Diagnosis not present

## 2024-07-14 DIAGNOSIS — O35BXX0 Maternal care for other (suspected) fetal abnormality and damage, fetal cardiac anomalies, not applicable or unspecified: Secondary | ICD-10-CM

## 2024-07-14 DIAGNOSIS — O99112 Other diseases of the blood and blood-forming organs and certain disorders involving the immune mechanism complicating pregnancy, second trimester: Secondary | ICD-10-CM | POA: Diagnosis not present

## 2024-07-14 DIAGNOSIS — O09212 Supervision of pregnancy with history of pre-term labor, second trimester: Secondary | ICD-10-CM | POA: Insufficient documentation

## 2024-07-14 NOTE — Progress Notes (Signed)
   Patient information  Patient Name: Kelly [name]  Patient MRN:   [id number]  Referring practice: MFM Referring Provider: [place] [facility]  Problem List   Patient Active Problem List   Diagnosis Date Noted   History of preterm premature rupture of membranes (PPROM) 05/01/2024   History of preterm delivery, currently pregnant 05/01/2024   Supervision of high risk pregnancy, antepartum 02/27/2024   Chronic anticoagulation 04/24/2022   Normocytic anemia 04/24/2022   History of postpartum pre-eclampsia 03/31/2019   History of cesarean section 03/16/2019   Iron deficiency anemia during pregnancy, antepartum 10/14/2018   Systemic lupus erythematosus affecting pregnancy (HCC) 06/04/2012   Maternal Fetal medicine Consult  Kelly [name] is a 28 y.o. G2P0101 at [redacted]w[redacted]d here for ultrasound and consultation. [facility] Kelly [name] is doing well today with no acute concerns. Today we focused on the following:   SLE: Patient is doing well today.  The fetal growth is in the normal range.  She is compliant with her Imuran .  She has no evidence of flares.  Antenatal testing will be scheduled for 32 weeks with a growth ultrasound at this time as well.  I discussed the ultrasound finds with the patient and she will continue to come to her visits then monitor fetal movement.  The patient had time to ask questions that were answered to her satisfaction.  She verbalized understanding and agrees to proceed with the plan below.  Recommendations -Serial growth ultrasounds every 4-6 weeks until delivery -Antenatal testing to start around 32 weeks  -Delivery timing pending chemical course - Continue Imuran   Review of Systems: A review of systems was performed and was negative except per HPI   Vitals and Physical Exam    07/14/2024    4:00 PM 07/14/2024    3:35 PM 07/08/2024    8:29 AM  Vitals with BMI  Weight   176 lbs  BMI   32.18  Systolic 130 125 880  Diastolic 86 90 81  Pulse 96 101  94   Sitting comfortably on the sonogram table Nonlabored breathing Normal rate and rhythm Abdomen is nontender  Past pregnancies OB History  Gravida Para Term Preterm AB Living  2 1  1  1   SAB IAB Ectopic Multiple Live Births     0 1    # Outcome Date GA Lbr Len/2nd Weight Sex Type Anes PTL Lv  2 Current           1 Preterm 03/16/19 [redacted]w[redacted]d  5 lb 4.1 oz (2.384 kg) M CS-LTranv EPI  LIV     I spent 20 minutes reviewing the patients chart, including labs and images as well as counseling the patient about her medical conditions. Greater than 50% of the time was spent in direct face-to-face patient counseling.  Kelly [name]  MFM, [facility]   07/14/2024  4:15 PM

## 2024-07-20 ENCOUNTER — Ambulatory Visit (INDEPENDENT_AMBULATORY_CARE_PROVIDER_SITE_OTHER): Admitting: Obstetrics and Gynecology

## 2024-07-20 ENCOUNTER — Encounter: Payer: Self-pay | Admitting: Obstetrics and Gynecology

## 2024-07-20 VITALS — BP 124/86 | HR 98 | Wt 176.6 lb

## 2024-07-20 DIAGNOSIS — Z98891 History of uterine scar from previous surgery: Secondary | ICD-10-CM

## 2024-07-20 DIAGNOSIS — Z3A29 29 weeks gestation of pregnancy: Secondary | ICD-10-CM

## 2024-07-20 DIAGNOSIS — O09899 Supervision of other high risk pregnancies, unspecified trimester: Secondary | ICD-10-CM | POA: Diagnosis not present

## 2024-07-20 DIAGNOSIS — M329 Systemic lupus erythematosus, unspecified: Secondary | ICD-10-CM | POA: Diagnosis not present

## 2024-07-20 DIAGNOSIS — O0990 Supervision of high risk pregnancy, unspecified, unspecified trimester: Secondary | ICD-10-CM | POA: Diagnosis not present

## 2024-07-20 DIAGNOSIS — O26893 Other specified pregnancy related conditions, third trimester: Secondary | ICD-10-CM

## 2024-07-20 DIAGNOSIS — R12 Heartburn: Secondary | ICD-10-CM

## 2024-07-20 MED ORDER — PANTOPRAZOLE SODIUM 40 MG PO TBEC: 40.0000 mg | DELAYED_RELEASE_TABLET | Freq: Every day | ORAL | 1 refills | Status: DC

## 2024-07-20 NOTE — Progress Notes (Unsigned)
 Pt presents for ROB visit. Pt c/o heartburn that causing vomiting and yellow vaginal discharge.

## 2024-07-20 NOTE — Patient Instructions (Signed)
 Safe Medications in Pregnancy    Acne: Benzoyl Peroxide Salicylic Acid  Backache/Headache: Tylenol : 2 regular strength every 4 hours OR              2 Extra strength every 6 hours  Colds/Coughs/Allergies: Benadryl  (alcohol free) 25 mg every 6 hours as needed Breath right strips Claritin Cepacol throat lozenges Chloraseptic throat spray Cold-Eeze- up to three times per day Cough drops, alcohol free Flonase (by prescription only) Guaifenesin Mucinex Robitussin DM (plain only, alcohol free) Saline nasal spray/drops Sudafed (pseudoephedrine) & Actifed ** use only after [redacted] weeks gestation and if you do not have high blood pressure Tylenol  Vicks Vaporub Zinc lozenges Zyrtec

## 2024-07-20 NOTE — Progress Notes (Unsigned)
 PRENATAL VISIT NOTE  Subjective:  Kelly [name] is a 28 y.o. G2P0101 at [redacted]w[redacted]d being seen today for ongoing prenatal care.  She is currently monitored for the following issues for this high-risk pregnancy and has Iron deficiency anemia during pregnancy, antepartum; History of cesarean section; History of postpartum pre-eclampsia; Chronic anticoagulation; Normocytic anemia; Supervision of high risk pregnancy, antepartum; History of preterm premature rupture of membranes (PPROM); History of preterm delivery, currently pregnant; and Systemic lupus erythematosus affecting pregnancy (HCC) on their problem list.  Patient reports heartburn.  Contractions: Not present. Vag. Bleeding: None.  Movement: Present. Denies leaking of fluid.   The following portions of the patient's history were reviewed and updated as appropriate: allergies, current medications, past family history, past medical history, past social history, past surgical history and problem list.   Objective:   Vitals:   07/20/24 1436  BP: 124/86  Pulse: 98  Weight: 80.1 kg    Fetal Status:  Fetal Heart Rate (bpm): 149   Movement: Present    General: Alert, oriented and cooperative. Patient is in no acute distress.  Skin: Skin is warm and dry. No rash noted.   Cardiovascular: Normal heart rate noted  Respiratory: Normal respiratory effort, no problems with respiration noted  Abdomen: Soft, gravid, appropriate for gestational age.  Pain/Pressure: Present     Pelvic: Cervical exam deferred        Extremities: Normal range of motion.  Edema: Trace  Mental Status: Normal mood and affect. Normal behavior. Normal judgment and thought content.      02/27/2024    9:03 AM 07/12/2022    3:47 PM 06/01/2022   11:27 AM  Depression screen PHQ 2/9  Decreased Interest 0 0 0  Down, Depressed, Hopeless 0 0 0  PHQ - 2 Score 0 0 0  Altered sleeping 0 1 0  Tired, decreased energy 0 1 1  Change in appetite 0 0 0  Feeling bad or failure  about yourself  0 0 0  Trouble concentrating 1 0 1  Moving slowly or fidgety/restless 0 0 0  Suicidal thoughts 0 0 0  PHQ-9 Score 1  2  2       Data saved with a previous flowsheet row definition        02/27/2024    9:07 AM 07/12/2022    3:48 PM 06/01/2022   11:27 AM  GAD 7 : Generalized Anxiety Score  Nervous, Anxious, on Edge 0 0 0  Control/stop worrying 1 0 0  Worry too much - different things 1 0 0  Trouble relaxing 0 0 0  Restless 0 1 0  Easily annoyed or irritable 0 0 0  Afraid - awful might happen 0 0 0  Total GAD 7 Score 2 1 0    Assessment and Plan:  Pregnancy: G2P0101 at [redacted]w[redacted]d 1. Supervision of high risk pregnancy, antepartum (Primary) BP and FHR normal  2. History of cesarean section Desires TOLAC  3. History of preterm delivery, currently pregnant Discussed preterm labor signs  4. Systemic lupus erythematosus, unspecified SLE type, unspecified organ involvement status [facility]) S/p rheumatology 10/6 Continue ASA, hydroxychloroquine , azathioprine , prenisone Follow up u/s 10/21  5. [redacted] weeks gestation of pregnancy MFM follow up on 08/21/24  6. Heartburn during pregnancy in third trimester Has taken tums and prilosec in the past. Will call in Protonix  40mg  to pharmacy.  Preterm labor symptoms and general obstetric precautions including but not limited to vaginal bleeding, contractions, leaking of fluid and fetal movement  were reviewed in detail with the patient. Please refer to After Visit Summary for other counseling recommendations.   Return in about 2 weeks (around 08/03/2024) for OB VISIT (MD or APP).  Future Appointments  Date Time Provider Department Center  08/21/2024  3:15 PM [facility] PROVIDER 1 [facility]  08/21/2024  3:30 PM [facility] US2 [facility]    Junelle Hashemi VEAR Me, Student-MidWife

## 2024-07-22 ENCOUNTER — Encounter: Payer: Self-pay | Admitting: Obstetrics and Gynecology

## 2024-07-26 ENCOUNTER — Inpatient Hospital Stay (HOSPITAL_COMMUNITY)

## 2024-07-26 ENCOUNTER — Inpatient Hospital Stay (HOSPITAL_COMMUNITY)
Admission: AD | Admit: 2024-07-26 | Discharge: 2024-07-26 | Disposition: A | Discharge status: Home / Self Care | Attending: Obstetrics and Gynecology | Admitting: Obstetrics and Gynecology

## 2024-07-26 DIAGNOSIS — O99513 Diseases of the respiratory system complicating pregnancy, third trimester: Secondary | ICD-10-CM | POA: Diagnosis not present

## 2024-07-26 DIAGNOSIS — B9789 Other viral agents as the cause of diseases classified elsewhere: Secondary | ICD-10-CM | POA: Insufficient documentation

## 2024-07-26 DIAGNOSIS — Z3689 Encounter for other specified antenatal screening: Secondary | ICD-10-CM

## 2024-07-26 DIAGNOSIS — J069 Acute upper respiratory infection, unspecified: Secondary | ICD-10-CM | POA: Insufficient documentation

## 2024-07-26 DIAGNOSIS — R059 Cough, unspecified: Secondary | ICD-10-CM | POA: Diagnosis not present

## 2024-07-26 DIAGNOSIS — Z8709 Personal history of other diseases of the respiratory system: Secondary | ICD-10-CM

## 2024-07-26 DIAGNOSIS — Z3A30 30 weeks gestation of pregnancy: Secondary | ICD-10-CM | POA: Diagnosis not present

## 2024-07-26 DIAGNOSIS — O98513 Other viral diseases complicating pregnancy, third trimester: Secondary | ICD-10-CM | POA: Diagnosis not present

## 2024-07-26 DIAGNOSIS — R0981 Nasal congestion: Secondary | ICD-10-CM | POA: Diagnosis not present

## 2024-07-26 LAB — RESP PANEL BY RT-PCR (RSV, FLU A&B, COVID)  RVPGX2
Influenza A by PCR: NEGATIVE
Influenza B by PCR: NEGATIVE
Resp Syncytial Virus by PCR: NEGATIVE
SARS Coronavirus 2 by RT PCR: NEGATIVE

## 2024-07-26 MED ORDER — FLUTICASONE PROPIONATE 50 MCG/ACT NA SUSP
2.0000 | Freq: Once | NASAL | Status: AC
  Administered 2024-07-26: 2 via NASAL
  Filled 2024-07-26: qty 16

## 2024-07-26 MED ORDER — BUDESONIDE-FORMOTEROL FUMARATE 80-4.5 MCG/ACT IN AERO: 2.0000 | INHALATION_SPRAY | Freq: Two times a day (BID) | RESPIRATORY_TRACT | 12 refills | Status: AC

## 2024-07-26 MED ORDER — DM-GUAIFENESIN ER 30-600 MG PO TB12
1.0000 | ORAL_TABLET | Freq: Once | ORAL | Status: AC
  Administered 2024-07-26: 1 via ORAL
  Filled 2024-07-26: qty 1

## 2024-07-26 MED ORDER — IPRATROPIUM-ALBUTEROL 0.5-2.5 (3) MG/3ML IN SOLN
3.0000 mL | Freq: Once | RESPIRATORY_TRACT | Status: AC
  Administered 2024-07-26: 3 mL via RESPIRATORY_TRACT
  Filled 2024-07-26: qty 3

## 2024-07-26 MED ORDER — PROMETHAZINE-DM 6.25-15 MG/5ML PO SYRP: 2.5000 mL | ORAL_SOLUTION | Freq: Four times a day (QID) | ORAL | 0 refills | Status: DC | PRN

## 2024-07-26 NOTE — Discharge Instructions (Signed)
 Can take plain Mucinex  (guaifenesin ) + prescription cough syrup.   Safe Medications in Pregnancy   Acne:  Benzoyl Peroxide  Salicylic Acid   Backache/Headache:  Tylenol : 2 regular strength every 4 hours OR               2 Extra strength every 6 hours   Colds/Coughs/Allergies:  Benadryl  (alcohol free) 25 mg every 6 hours as needed  Breath right strips  Claritin  Cepacol throat lozenges  Chloraseptic throat spray  Cold-Eeze- up to three times per day  Cough drops, alcohol free  Flonase (by prescription only)  Guaifenesin   Mucinex   Robitussin DM (plain only, alcohol free)  Saline nasal spray/drops  Sudafed (pseudoephedrine) & Actifed * use only after [redacted] weeks gestation and if you do not have high blood pressure  Tylenol   Vicks Vaporub  Zinc  lozenges  Zyrtec   Constipation:  Colace  Ducolax suppositories  Fleet enema  Glycerin  suppositories  Metamucil  Milk of magnesia  Miralax  Senokot  Smooth move tea   Diarrhea:  Kaopectate  Imodium A-D   *NO pepto Bismol   Hemorrhoids:  Anusol  Anusol HC  Preparation H  Tucks   Indigestion:  Tums  Maalox  Mylanta  Zantac  Pepcid   Insomnia:  Benadryl  (alcohol free) 25mg  every 6 hours as needed  Tylenol  PM  Unisom, no Gelcaps   Leg Cramps:  Tums  MagGel   Nausea/Vomiting:  Bonine  Dramamine  Emetrol  Ginger extract  Sea bands  Meclizine  Nausea medication to take during pregnancy:  Unisom (doxylamine succinate 25 mg tablets) Take one tablet daily at bedtime. If symptoms are not adequately controlled, the dose can be increased to a maximum recommended dose of two tablets daily (1/2 tablet in the morning, 1/2 tablet mid-afternoon and one at bedtime).  Vitamin B6 100mg  tablets. Take one tablet twice a day (up to 200 mg per day).   Skin Rashes:  Aveeno products  Benadryl  cream or 25mg  every 6 hours as needed  Calamine Lotion  1% cortisone cream   Yeast infection:  Gyne-lotrimin 7  Monistat 7     **If taking multiple medications, please check labels to avoid duplicating the same active ingredients  **take medication as directed on the label  ** Do not exceed 4000 mg of tylenol  in 24 hours  **Do not take medications that contain aspirin  or ibuprofen 

## 2024-07-26 NOTE — MAU Note (Signed)
 Kelly [name] is a 28 y.o. at [redacted]w[redacted]d here in MAU reporting: Cough, nasal congestion, and runny nose present for 6 days. Had a fever tmax 5 days ago of 99.0 degrees. Has been taking robitussin for 4 days with no improvement.   Onset of complaint: Upper Respiratory infection Pain score: 0 There were no vitals filed for this visit.   FHT: 130  Lab orders placed from triage: no

## 2024-07-26 NOTE — MAU Provider Note (Signed)
 Chief Complaint:  Cough  HPI   Event Date/Time   First Provider Initiated Contact with Patient 07/26/24 1458     Kelly [name] is a 28 y.o. G2P0101 at [redacted]w[redacted]d who presents to maternity admissions reporting URI symptoms with cough for the past 6 days. Has been taking Robitussin-DM (with mucinex ) but it is not helping very much, nor is she getting any better. Has a cough, nasal congestion, mild intermittent headache and on Friday coughed hard enough to throw up. Also had an episode of diarrhea on Friday. No n/v/d today. No other physical complaints, endorses +FM, -LOF or vaginal bleeding.  Has a history of pneumonia, says this does not feel like that. Also has a history of SOB helped by an inhaler but never given a diagnosis of asthma.    Pregnancy Course: Receives care at [facility]. Records reviewed.  Past Medical History:  Diagnosis Date   Anxiety    Cellulitis and abscess of leg 04/24/2022   Chest discomfort 09/2015   Pt states due to shortness of breath   Cough present for greater than 3 weeks    COVID-19 virus infection 04/26/2022   Gonorrhea affecting pregnancy in third trimester 03/15/2019   Treated 03/13/19     Hydronephrosis of right kidney 02/24/2019   Mild on 02/24/2019     Late Entry to [employer]- April 2020- Social Distancing  11/27/2018   Pt signed up for [employer]. Will come to office to pick up BP cuff.    Lupus (systemic lupus erythematosus) (HCC)    dx 2010   Pneumonia    2015   Shingles 06/24/2022   Shortness of breath dyspnea 09/2015   changed inhaler with relief   OB History  Gravida Para Term Preterm AB Living  2 1  1  1   SAB IAB Ectopic Multiple Live Births     0 1    # Outcome Date GA Lbr Len/2nd Weight Sex Type Anes PTL Lv  2 Current           1 Preterm 03/16/19 [redacted]w[redacted]d  5 lb 4.1 oz (2.384 kg) M CS-LTranv EPI  LIV   Past Surgical History:  Procedure Laterality Date   BRONCHOSCOPY  11/2013   CESAREAN SECTION N/A 03/16/2019   Procedure:  CESAREAN SECTION;  Surgeon: Barbra Lang PARAS, DO;  Location: [facility] LD [facility];  Service: Obstetrics;  Laterality: N/A;   WISDOM TOOTH EXTRACTION  2014   Family History  Problem Relation Age of Onset   Healthy Mother    Healthy Father    Thyroid  disease Maternal Grandmother    Lupus Maternal Grandmother    Diabetes Paternal Grandmother    Asthma Neg Hx    Cancer Neg Hx    Hypertension Neg Hx    Heart disease Neg Hx    Social History   Tobacco Use   Smoking status: Never   Smokeless tobacco: Never  Vaping Use   Vaping status: Never Used  Substance Use Topics   Alcohol use: Not Currently    Comment: occ   Drug use: Not Currently    Types: Marijuana    Comment: last use sep 2024   Allergies  Allergen Reactions   Doxycycline  Hives, Rash and Other (See Comments)    Blister patches on skin Bullous eruption, required hospital treatment 01/2022   Methotrexate Other (See Comments)    Other Reaction(s): hairloss   No medications prior to admission.   I have reviewed patient's Past Medical Hx, Surgical Hx, Family Hx,  Social Hx, medications and allergies.   ROS  Pertinent items noted in HPI and remainder of comprehensive ROS otherwise negative.   PHYSICAL EXAM  Patient Vitals for the past 24 hrs:  BP Temp Temp src Pulse Resp SpO2  07/26/24 1622 -- -- -- -- -- 99 %  07/26/24 1600 -- -- -- -- -- 100 %  07/26/24 1530 -- -- -- -- -- 100 %  07/26/24 1500 -- -- -- -- -- 100 %  07/26/24 1430 -- -- -- -- -- 100 %  07/26/24 1411 -- -- -- -- -- 100 %  07/26/24 1406 119/81 -- -- 96 18 100 %  07/26/24 1356 122/78 98.7 F (37.1 C) Oral 90 18 100 %   Constitutional: Well-developed, well-nourished female in no acute distress.  Cardiovascular: normal rate & rhythm, warm and well-perfused Respiratory: normal effort, coughs with inspiration. Lung sounds normal in all quadrants. GI: Abd soft, non-tender, non-distended MS: Extremities nontender, no edema, normal ROM Neurologic: Alert and  oriented x 4.  GU: no CVA tenderness Pelvic: exam deferred  Fetal Tracing: reactive Baseline: 135 Variability: moderate Accelerations: 15x15 Decelerations: none Toco: relaxed   Labs: Results for orders placed or performed during the hospital encounter of 07/26/24 (from the past 24 hours)  Resp panel by RT-PCR (RSV, Flu A&B, Covid) Anterior Nasal Swab     Status: None   Collection Time: 07/26/24  2:45 PM   Specimen: Anterior Nasal Swab  Result Value Ref Range   SARS Coronavirus 2 by RT PCR NEGATIVE NEGATIVE   Influenza A by PCR NEGATIVE NEGATIVE   Influenza B by PCR NEGATIVE NEGATIVE   Resp Syncytial Virus by PCR NEGATIVE NEGATIVE   Imaging:  DG CHEST PORT 1 VIEW Result Date: 07/26/2024 CLINICAL DATA:  Cough and nasal congestion for 6 days. EXAM: PORTABLE CHEST 1 VIEW COMPARISON:  05/25/2022. FINDINGS: Cardiac silhouette is normal in size. Normal mediastinal and hilar contours. Clear lungs.  No pleural effusion or pneumothorax. Skeletal structures are unremarkable. IMPRESSION: No active disease. Electronically Signed   By: Alm Parkins M.D.   On: 07/26/2024 15:35   MDM & MAU COURSE  MDM: Moderate  MAU Course: Orders Placed This Encounter  Procedures   Resp panel by RT-PCR (RSV, Flu A&B, Covid) Anterior Nasal Swab   DG CHEST PORT 1 VIEW   Airborne and Contact precautions   Discharge patient   Meds ordered this encounter  Medications   dextromethorphan-guaiFENesin  (MUCINEX  DM) 30-600 MG per 12 hr tablet 1 tablet   ipratropium-albuterol  (DUONEB) 0.5-2.5 (3) MG/3ML nebulizer solution 3 mL   fluticasone (FLONASE) 50 MCG/ACT nasal spray 2 spray   budesonide -formoterol (SYMBICORT) 80-4.5 MCG/ACT inhaler    Sig: Inhale 2 puffs into the lungs 2 (two) times daily. Until illness clears.    Dispense:  1 each    Refill:  12   promethazine -dextromethorphan (PROMETHAZINE -DM) 6.25-15 MG/5ML syrup    Sig: Take 2.5-5 mLs by mouth 4 (four) times daily as needed for cough.    Dispense:   118 mL    Refill:  0   Meds ordered as well as Duo-Neb treatment. Pt endorses feeling better and easier to breathe laying down. Respiratory panel negative. Stable for discharge home.  Gave safe med list, symbicort inhaler and prescription cough syrup.  ASSESSMENT   1. Viral upper respiratory illness   2. History of asthma   3. [redacted] weeks gestation of pregnancy   4. NST (non-stress test) reactive    PLAN  Discharge home in  stable condition with return precautions.     Allergies as of 07/26/2024       Reactions   Doxycycline  Hives, Rash, Other (See Comments)   Blister patches on skin Bullous eruption, required hospital treatment 01/2022   Methotrexate Other (See Comments)   Other Reaction(s): hairloss        Medication List     TAKE these medications    acetaminophen  500 MG tablet Commonly known as: TYLENOL  Take 1,000 mg by mouth every 6 (six) hours as needed.   albuterol  108 (90 Base) MCG/ACT inhaler Commonly known as: VENTOLIN  HFA Inhale 2 puffs into the lungs as needed for wheezing or shortness of breath.   aspirin  EC 81 MG tablet Take 1 tablet (81 mg total) by mouth daily. Take after 12 weeks   azaTHIOprine  50 MG tablet Commonly known as: IMURAN  Take 50 mg by mouth daily. What changed: Another medication with the same name was removed. Continue taking this medication, and follow the directions you see here.   Blood Pressure Kit Devi 1 Device by Does not apply route once a week.   budesonide -formoterol 80-4.5 MCG/ACT inhaler Commonly known as: Symbicort Inhale 2 puffs into the lungs 2 (two) times daily. Until illness clears.   cholecalciferol 25 MCG (1000 UNIT) tablet Commonly known as: VITAMIN D3 Take 1,000 Units by mouth once a week.   hydroxychloroquine  200 MG tablet Commonly known as: PLAQUENIL  Take 400 mg by mouth daily.   pantoprazole  40 MG tablet Commonly known as: Protonix  Take 1 tablet (40 mg total) by mouth daily.   predniSONE  5 MG  tablet Commonly known as: DELTASONE  Take 5-25 mg by mouth as directed. Taking 25 mg daily currently until on the  02-21-22 will take 20 mg for a week, then titrate down 5 mg each week until taking 5 mg daily.   Prenatal 28-0.8 MG Tabs Take 1 tablet by mouth daily.   promethazine -dextromethorphan 6.25-15 MG/5ML syrup Commonly known as: PROMETHAZINE -DM Take 2.5-5 mLs by mouth 4 (four) times daily as needed for cough.       Cornell Finder, CNM, MSN, IBCLC Certified Nurse Midwife, [facility]

## 2024-07-27 DIAGNOSIS — M329 Systemic lupus erythematosus, unspecified: Secondary | ICD-10-CM | POA: Diagnosis not present

## 2024-07-27 DIAGNOSIS — O99891 Other specified diseases and conditions complicating pregnancy: Secondary | ICD-10-CM | POA: Diagnosis not present

## 2024-07-27 DIAGNOSIS — Z3A30 30 weeks gestation of pregnancy: Secondary | ICD-10-CM | POA: Diagnosis not present

## 2024-08-05 ENCOUNTER — Ambulatory Visit: Admitting: Obstetrics and Gynecology

## 2024-08-05 VITALS — BP 129/88 | HR 96 | Wt 183.0 lb

## 2024-08-05 DIAGNOSIS — Z3A31 31 weeks gestation of pregnancy: Secondary | ICD-10-CM

## 2024-08-05 DIAGNOSIS — M329 Systemic lupus erythematosus, unspecified: Secondary | ICD-10-CM

## 2024-08-05 DIAGNOSIS — Z98891 History of uterine scar from previous surgery: Secondary | ICD-10-CM

## 2024-08-05 DIAGNOSIS — O09893 Supervision of other high risk pregnancies, third trimester: Secondary | ICD-10-CM | POA: Diagnosis not present

## 2024-08-05 DIAGNOSIS — Z8679 Personal history of other diseases of the circulatory system: Secondary | ICD-10-CM

## 2024-08-05 DIAGNOSIS — Z23 Encounter for immunization: Secondary | ICD-10-CM | POA: Diagnosis not present

## 2024-08-05 DIAGNOSIS — Z7901 Long term (current) use of anticoagulants: Secondary | ICD-10-CM

## 2024-08-05 DIAGNOSIS — D509 Iron deficiency anemia, unspecified: Secondary | ICD-10-CM | POA: Diagnosis not present

## 2024-08-05 DIAGNOSIS — O99013 Anemia complicating pregnancy, third trimester: Secondary | ICD-10-CM

## 2024-08-05 DIAGNOSIS — Z8759 Personal history of other complications of pregnancy, childbirth and the puerperium: Secondary | ICD-10-CM | POA: Diagnosis not present

## 2024-08-05 DIAGNOSIS — O99891 Other specified diseases and conditions complicating pregnancy: Secondary | ICD-10-CM

## 2024-08-05 DIAGNOSIS — O99019 Anemia complicating pregnancy, unspecified trimester: Secondary | ICD-10-CM | POA: Diagnosis not present

## 2024-08-05 DIAGNOSIS — O0993 Supervision of high risk pregnancy, unspecified, third trimester: Secondary | ICD-10-CM | POA: Diagnosis not present

## 2024-08-05 DIAGNOSIS — O09899 Supervision of other high risk pregnancies, unspecified trimester: Secondary | ICD-10-CM

## 2024-08-05 DIAGNOSIS — O0990 Supervision of high risk pregnancy, unspecified, unspecified trimester: Secondary | ICD-10-CM

## 2024-08-05 NOTE — Assessment & Plan Note (Deleted)
 If CS, MFM recommends prophylactic lovenox  x 10 days PP

## 2024-08-05 NOTE — Progress Notes (Signed)
° °  PRENATAL VISIT NOTE  Subjective:  Kelly [name] is a 28 y.o. G2P0101 at [redacted]w[redacted]d being seen today for ongoing prenatal care.  She is currently monitored for the following issues for this high-risk pregnancy and has Iron deficiency anemia during pregnancy, antepartum; History of cesarean section; History of postpartum pre-eclampsia; Supervision of high risk pregnancy, antepartum; History of preterm premature rupture of membranes (PPROM); History of preterm delivery, currently pregnant; and Systemic lupus erythematosus affecting pregnancy (HCC) on their problem list.  Patient reports no complaints.  Contractions: Not present. Vag. Bleeding: None.  Movement: Present. Denies leaking of fluid.   The following portions of the patient's history were reviewed and updated as appropriate: allergies, current medications, past family history, past medical history, past social history, past surgical history and problem list.   Objective:   Vitals:   08/05/24 1343  BP: 129/88  Pulse: 96  Weight: 183 lb (83 kg)    Fetal Status: Fetal Heart Rate (bpm): 140   Movement: Present     General:  Alert, oriented and cooperative. Patient is in no acute distress.  Skin: Skin is warm and dry. No rash noted.   Cardiovascular: Normal heart rate noted  Respiratory: Normal respiratory effort, no problems with respiration noted  Abdomen: Soft, gravid, appropriate for gestational age.  Pain/Pressure: Present      Assessment and Plan:  Pregnancy: G2P0101 at [redacted]w[redacted]d 1. Supervision of high risk pregnancy, antepartum (Primary) 2. [redacted] weeks gestation of pregnancy Tdap & flu shot today Discussed recommendation for RSV vaccine next visit  3. Systemic lupus erythematosus affecting pregnancy (HCC) 4. Chronic anticoagulation 5. History of postpartum pre-eclampsia - F/w rheum at [facility], last appt 12/1 - Current meds: imuran , plaquenil , & prednisone  2.5mg  daily - +SSA/SSB - normal serial fetal echo, last 11/13 w/ no  additional f/u needed  - Normal baseline preE labs (P/C 0.18) - Normal growth US  11/18, next scheduled 12/26 - Weekly antenatal testing to start next week - Of note, chronic anticoagulation on problem list. Will resolve. She does not have APAS per rheum or MFM, but prophylactic lovenox  x 10d PP was recommended if she has a CS  6. History of cesarean section G1 NRFHT, infant weight 2384g For TOLAC, consent previously signed 11/12  7. History of preterm delivery, currently pregnant 8. History of preterm premature rupture of membranes (PPROM)  9. Iron deficiency anemia during pregnancy, antepartum Hgb 10.0, MCV 97 on 11/12, stable Not currently on iron supplement Repeat CBC & anemia panel today  Please refer to After Visit Summary for other counseling recommendations.   Return in about 1 week (around 08/12/2024) for RN visit for NST only, then ROB w/ NST with female MD in 2 weeks.  Future Appointments  Date Time Provider Department Center  08/12/2024  3:10 PM [facility] INTAKE [facility] None  08/21/2024  9:15 AM Rudy Carlin LABOR, MD [facility] None  08/21/2024  3:15 PM [facility] PROVIDER 1 [facility]  08/21/2024  3:30 PM [facility] US2 [facility]   Kieth JAYSON Carolin, MD

## 2024-08-06 ENCOUNTER — Encounter: Payer: Self-pay | Admitting: Obstetrics and Gynecology

## 2024-08-06 ENCOUNTER — Ambulatory Visit: Payer: Self-pay | Admitting: Obstetrics and Gynecology

## 2024-08-06 LAB — B12 AND FOLATE PANEL
Folate: >20 ng/mL (ref >3.0)
Vitamin B-12: 237 pg/mL (ref 232–1245)

## 2024-08-06 LAB — CBC
Hematocrit: 29.3 % — ABNORMAL LOW (ref 34.0–46.6)
Hemoglobin: 9.8 g/dL — ABNORMAL LOW (ref 11.1–15.9)
MCH: 32.6 pg (ref 26.6–33.0)
MCHC: 33.4 g/dL (ref 31.5–35.7)
MCV: 97 fL (ref 79–97)
Platelets: 227 x10E3/uL (ref 150–450)
RBC: 3.01 x10E6/uL — ABNORMAL LOW (ref 3.77–5.28)
RDW: 13.8 % (ref 11.7–15.4)
WBC: 4.5 x10E3/uL (ref 3.4–10.8)

## 2024-08-06 LAB — IRON,TIBC AND FERRITIN PANEL
Ferritin: 49 ng/mL (ref 15–150)
Iron Saturation: 33 % (ref 15–55)
Iron: 84 ug/dL (ref 27–159)
Total Iron Binding Capacity: 258 ug/dL (ref 250–450)
UIBC: 174 ug/dL (ref 131–425)

## 2024-08-06 MED ORDER — B-12 1000 MCG PO TABS: 1.0000 | ORAL_TABLET | Freq: Every day | ORAL | 2 refills | Status: AC

## 2024-08-11 ENCOUNTER — Other Ambulatory Visit: Payer: Self-pay | Admitting: Obstetrics and Gynecology

## 2024-08-11 DIAGNOSIS — O26893 Other specified pregnancy related conditions, third trimester: Secondary | ICD-10-CM

## 2024-08-12 ENCOUNTER — Ambulatory Visit (INDEPENDENT_AMBULATORY_CARE_PROVIDER_SITE_OTHER): Admitting: *Deleted

## 2024-08-12 VITALS — BP 126/86 | HR 90 | Wt 181.6 lb

## 2024-08-12 DIAGNOSIS — Z3A32 32 weeks gestation of pregnancy: Secondary | ICD-10-CM | POA: Diagnosis not present

## 2024-08-12 DIAGNOSIS — M329 Systemic lupus erythematosus, unspecified: Secondary | ICD-10-CM

## 2024-08-12 DIAGNOSIS — O99891 Other specified diseases and conditions complicating pregnancy: Secondary | ICD-10-CM | POA: Diagnosis not present

## 2024-08-12 DIAGNOSIS — O0993 Supervision of high risk pregnancy, unspecified, third trimester: Secondary | ICD-10-CM | POA: Diagnosis not present

## 2024-08-12 DIAGNOSIS — O0990 Supervision of high risk pregnancy, unspecified, unspecified trimester: Secondary | ICD-10-CM

## 2024-08-12 NOTE — Progress Notes (Signed)
 Kelly [name] presents for weekly antenatal testing/ NST per antenatal testing guidelines and MFM recommendation. Pt has Lupus and associated treatment. Pt reports abdominal pain/ UC's and back pain off an on. She reports perineal pressure and pain. FHR 140's/ 15X15/variable decel w/ 2 UC's back to back. Returned to baseline. Pt repositioned to side. No further UC's or variable decels noted. Frequent FM felt by pt and palpated by RN. Tracing reviewed by Dr. Eveline. Pt given PTL/ pPROM precautions and advised to stay hydrated, empty bladder Q 2 hrs, and rest when UC's are noted. Advised belly band as well. Pt verbalized understanding. Pt will return for ROB and NST 12/26.

## 2024-08-21 ENCOUNTER — Ambulatory Visit: Payer: Self-pay | Admitting: Certified Nurse Midwife

## 2024-08-21 ENCOUNTER — Emergency Department (HOSPITAL_COMMUNITY): Admission: EM | Admit: 2024-08-21 | Source: Home / Self Care | Admitting: Obstetrics and Gynecology

## 2024-08-21 ENCOUNTER — Encounter: Payer: Self-pay | Admitting: Obstetrics

## 2024-08-21 ENCOUNTER — Ambulatory Visit (HOSPITAL_BASED_OUTPATIENT_CLINIC_OR_DEPARTMENT_OTHER): Admitting: Obstetrics and Gynecology

## 2024-08-21 ENCOUNTER — Encounter: Admitting: Obstetrics and Gynecology

## 2024-08-21 ENCOUNTER — Ambulatory Visit: Admitting: Obstetrics

## 2024-08-21 ENCOUNTER — Emergency Department (HOSPITAL_COMMUNITY)
Admission: EM | Admit: 2024-08-21 | Discharge: 2024-08-21 | Disposition: A | Discharge status: Home / Self Care | Source: Ambulatory Visit | Attending: Obstetrics and Gynecology | Admitting: Obstetrics and Gynecology

## 2024-08-21 ENCOUNTER — Ambulatory Visit

## 2024-08-21 VITALS — BP 132/95 | HR 94 | Wt 180.3 lb

## 2024-08-21 VITALS — BP 135/95 | HR 95

## 2024-08-21 DIAGNOSIS — Z8759 Personal history of other complications of pregnancy, childbirth and the puerperium: Secondary | ICD-10-CM

## 2024-08-21 DIAGNOSIS — O139 Gestational [pregnancy-induced] hypertension without significant proteinuria, unspecified trimester: Secondary | ICD-10-CM | POA: Insufficient documentation

## 2024-08-21 DIAGNOSIS — Z3A34 34 weeks gestation of pregnancy: Secondary | ICD-10-CM | POA: Diagnosis not present

## 2024-08-21 DIAGNOSIS — Z98891 History of uterine scar from previous surgery: Secondary | ICD-10-CM | POA: Diagnosis not present

## 2024-08-21 DIAGNOSIS — R030 Elevated blood-pressure reading, without diagnosis of hypertension: Secondary | ICD-10-CM | POA: Insufficient documentation

## 2024-08-21 DIAGNOSIS — Z362 Encounter for other antenatal screening follow-up: Secondary | ICD-10-CM | POA: Insufficient documentation

## 2024-08-21 DIAGNOSIS — O0990 Supervision of high risk pregnancy, unspecified, unspecified trimester: Secondary | ICD-10-CM

## 2024-08-21 DIAGNOSIS — O0993 Supervision of high risk pregnancy, unspecified, third trimester: Secondary | ICD-10-CM | POA: Diagnosis not present

## 2024-08-21 DIAGNOSIS — Z3A27 27 weeks gestation of pregnancy: Secondary | ICD-10-CM

## 2024-08-21 DIAGNOSIS — O99891 Other specified diseases and conditions complicating pregnancy: Secondary | ICD-10-CM

## 2024-08-21 DIAGNOSIS — O133 Gestational [pregnancy-induced] hypertension without significant proteinuria, third trimester: Secondary | ICD-10-CM

## 2024-08-21 DIAGNOSIS — Z3A33 33 weeks gestation of pregnancy: Secondary | ICD-10-CM

## 2024-08-21 DIAGNOSIS — O26893 Other specified pregnancy related conditions, third trimester: Secondary | ICD-10-CM | POA: Insufficient documentation

## 2024-08-21 DIAGNOSIS — Z8679 Personal history of other diseases of the circulatory system: Secondary | ICD-10-CM

## 2024-08-21 DIAGNOSIS — O09293 Supervision of pregnancy with other poor reproductive or obstetric history, third trimester: Secondary | ICD-10-CM

## 2024-08-21 DIAGNOSIS — M329 Systemic lupus erythematosus, unspecified: Secondary | ICD-10-CM

## 2024-08-21 DIAGNOSIS — O34219 Maternal care for unspecified type scar from previous cesarean delivery: Secondary | ICD-10-CM | POA: Insufficient documentation

## 2024-08-21 DIAGNOSIS — O09899 Supervision of other high risk pregnancies, unspecified trimester: Secondary | ICD-10-CM

## 2024-08-21 DIAGNOSIS — O09893 Supervision of other high risk pregnancies, third trimester: Secondary | ICD-10-CM

## 2024-08-21 LAB — COMPREHENSIVE METABOLIC PANEL WITH GFR
ALT: 14 U/L (ref 0–44)
AST: 21 U/L (ref 15–41)
Albumin: 3.4 g/dL — ABNORMAL LOW (ref 3.5–5.0)
Alkaline Phosphatase: 52 U/L (ref 38–126)
Anion gap: 9 (ref 5–15)
BUN: 7 mg/dL (ref 6–20)
CO2: 22 mmol/L (ref 22–32)
Calcium: 8.8 mg/dL — ABNORMAL LOW (ref 8.9–10.3)
Chloride: 106 mmol/L (ref 98–111)
Creatinine, Ser: 0.53 mg/dL (ref 0.44–1.00)
GFR, Estimated: >60 mL/min
Glucose, Bld: 87 mg/dL (ref 70–99)
Potassium: 3.6 mmol/L (ref 3.5–5.1)
Sodium: 136 mmol/L (ref 135–145)
Total Bilirubin: 0.5 mg/dL (ref 0.0–1.2)
Total Protein: 6.8 g/dL (ref 6.5–8.1)

## 2024-08-21 LAB — CBC
HCT: 31 % — ABNORMAL LOW (ref 36.0–46.0)
Hemoglobin: 10.8 g/dL — ABNORMAL LOW (ref 12.0–15.0)
MCH: 32.4 pg (ref 26.0–34.0)
MCHC: 34.8 g/dL (ref 30.0–36.0)
MCV: 93.1 fL (ref 80.0–100.0)
Platelets: 232 K/uL (ref 150–400)
RBC: 3.33 MIL/uL — ABNORMAL LOW (ref 3.87–5.11)
RDW: 15.1 % (ref 11.5–15.5)
WBC: 4.2 K/uL (ref 4.0–10.5)
nRBC: 0 % (ref 0.0–0.2)

## 2024-08-21 LAB — PROTEIN / CREATININE RATIO, URINE
Creatinine, Urine: 127 mg/dL
Protein Creatinine Ratio: 0.2 mg/mg — ABNORMAL HIGH
Total Protein, Urine: 19 mg/dL

## 2024-08-21 NOTE — MAU Provider Note (Signed)
 " Event Date/Time   First Provider Initiated Contact with Patient 08/21/24 1945     S [name] is a 28 y.o. G37P0101 pregnant female at [redacted]w[redacted]d who presents to MAU sent by MFM after BPP (8/8) for elevated BP and need for repeat BP and PEC labs. Denies any PEC symptoms, feeling regular fetal movement.   Receives care at [facility]. Prenatal records reviewed.  Pertinent items noted in HPI and remainder of comprehensive ROS otherwise negative.   O BP 128/86 (BP Location: Right Arm)   Pulse 96   Temp 97.8 F (36.6 C) (Oral)   Resp 12   Ht 5' 2 (1.575 m)   Wt 182 lb 1.6 oz (82.6 kg)   LMP 12/27/2023 (Exact Date)   BMI 33.31 kg/m  Physical Exam Vitals and nursing note reviewed.  Constitutional:      Appearance: Normal appearance.  Eyes:     Pupils: Pupils are equal, round, and reactive to light.  Cardiovascular:     Rate and Rhythm: Normal rate and regular rhythm.  Pulmonary:     Effort: Pulmonary effort is normal.  Musculoskeletal:        General: Normal range of motion.  Skin:    General: Skin is warm and dry.     Capillary Refill: Capillary refill takes less than 2 seconds.  Neurological:     Mental Status: She is alert and oriented to person, place, and time.  Psychiatric:        Mood and Affect: Mood normal.        Behavior: Behavior normal.    Results for orders placed or performed during the hospital encounter of 08/21/24 (from the past 24 hours)  CBC     Status: Abnormal   Collection Time: 08/21/24  5:50 PM  Result Value Ref Range   WBC 4.2 4.0 - 10.5 K/uL   RBC 3.33 (L) 3.87 - 5.11 MIL/uL   Hemoglobin 10.8 (L) 12.0 - 15.0 g/dL   HCT 68.9 (L) 63.9 - 53.9 %   MCV 93.1 80.0 - 100.0 fL   MCH 32.4 26.0 - 34.0 pg   MCHC 34.8 30.0 - 36.0 g/dL   RDW 84.8 88.4 - 84.4 %   Platelets 232 150 - 400 K/uL   nRBC 0.0 0.0 - 0.2 %  Comprehensive metabolic panel     Status: Abnormal   Collection Time: 08/21/24  5:50 PM  Result Value Ref Range   Sodium 136 135 -  145 mmol/L   Potassium 3.6 3.5 - 5.1 mmol/L   Chloride 106 98 - 111 mmol/L   CO2 22 22 - 32 mmol/L   Glucose, Bld 87 70 - 99 mg/dL   BUN 7 6 - 20 mg/dL   Creatinine, Ser 9.46 0.44 - 1.00 mg/dL   Calcium  8.8 (L) 8.9 - 10.3 mg/dL   Total Protein 6.8 6.5 - 8.1 g/dL   Albumin 3.4 (L) 3.5 - 5.0 g/dL   AST 21 15 - 41 U/L   ALT 14 0 - 44 U/L   Alkaline Phosphatase 52 38 - 126 U/L   Total Bilirubin 0.5 0.0 - 1.2 mg/dL   GFR, Estimated >39 >39 mL/min   Anion gap 9 5 - 15   MDM: Moderate MAU Course: MAU acuity very high and pt had normal BPP, did not get NST in MAU. Ordered labs shortly after she arrived, but P:Cr not collected until 2hrs after her arrival. PEC labs normal, and repeat BP was normal but on  review of prior visits - she had several elevated pressures on 07/03/24 and then again today, meeting criteria for gestational hypertension.  Explained gHTN diagnosis and recommendation for delivery at 37wks, pt amenable to this and understanding. As P:Cr will not change treatment plan, she does not have to wait for it to result before discharge (plan approved by Dr. Cleatus who was on the unit assisting with acuity level).  A 1. Supervision of high risk pregnancy, antepartum (Primary)  2. Gestational hypertension, third trimester - Discharge patient  3. [redacted] weeks gestation of pregnancy   P Discharge from MAU in stable condition with preeclampsia precautions Follow up at [facility] as scheduled for ongoing prenatal care  Future Appointments  Date Time Provider Department Center  08/31/2024  2:30 PM Davis, Devon E, PA-C [facility] None   Allergies as of 08/21/2024       Reactions   Doxycycline  Hives, Rash, Other (See Comments)   Blister patches on skin Bullous eruption, required hospital treatment 01/2022   Methotrexate Other (See Comments)   Other Reaction(s): hairloss        Medication List     STOP taking these medications    predniSONE  2.5 MG tablet Commonly known as:  DELTASONE    predniSONE  5 MG tablet Commonly known as: DELTASONE        TAKE these medications    acetaminophen  500 MG tablet Commonly known as: TYLENOL  Take 1,000 mg by mouth every 6 (six) hours as needed.   albuterol  108 (90 Base) MCG/ACT inhaler Commonly known as: VENTOLIN  HFA Inhale 2 puffs into the lungs as needed for wheezing or shortness of breath.   aspirin  EC 81 MG tablet Take 1 tablet (81 mg total) by mouth daily. Take after 12 weeks   azaTHIOprine  50 MG tablet Commonly known as: IMURAN  Take 50 mg by mouth daily.   B-12 1000 MCG Tabs Take 1 tablet (1,000 mcg total) by mouth daily.   Blood Pressure Kit Devi 1 Device by Does not apply route once a week.   budesonide -formoterol  80-4.5 MCG/ACT inhaler Commonly known as: Symbicort  Inhale 2 puffs into the lungs 2 (two) times daily. Until illness clears.   cholecalciferol 25 MCG (1000 UNIT) tablet Commonly known as: VITAMIN D3 Take 1,000 Units by mouth once a week.   clobetasol cream 0.05 % Commonly known as: TEMOVATE Apply 1 Application topically 2 (two) times daily.   hydroxychloroquine  200 MG tablet Commonly known as: PLAQUENIL  Take 400 mg by mouth daily.   pantoprazole  40 MG tablet Commonly known as: PROTONIX  TAKE 1 TABLET BY MOUTH EVERY DAY   Prenatal 28-0.8 MG Tabs Take 1 tablet by mouth daily.   promethazine -dextromethorphan  6.25-15 MG/5ML syrup Commonly known as: PROMETHAZINE -DM Take 2.5-5 mLs by mouth 4 (four) times daily as needed for cough.       Vannie Cornell SAUN[place], [place] 08/21/2024 7:47 PM   "

## 2024-08-21 NOTE — MAU Note (Signed)
 Pt says she went to [facility] appointment at 0900 and then to [facility]/S at 330pm-  and both visits - her BP was high . So sent her here for labs .  BP was 136/95, 136/96. Has slight H/A- bc has not had anything to eat since 330.  No vision problems . No epigastric pain.  No BP meds

## 2024-08-21 NOTE — Progress Notes (Signed)
 Pt has lupus. Weekly NST.   Pt states she feels super heavy in lower abdomen and groin.   BP in office today was 129/93 and 132/95.

## 2024-08-21 NOTE — Progress Notes (Signed)
 Subjective:  Kelly [name] is a 28 y.o. G2P0101 at [redacted]w[redacted]d being seen today for ongoing prenatal care.  She is currently monitored for the following issues for this high-risk pregnancy and has B12 deficiency anemia; History of cesarean section; History of postpartum pre-eclampsia; Supervision of high risk pregnancy, antepartum; History of preterm premature rupture of membranes (PPROM); History of preterm delivery, currently pregnant; Systemic lupus erythematosus affecting pregnancy (HCC); and Gestational hypertension on their problem list.  Patient reports no complaints.  Contractions: Irritability. Vag. Bleeding: None.  Movement: Present. Denies leaking of fluid.   The following portions of the patient's history were reviewed and updated as appropriate: allergies, current medications, past family history, past medical history, past social history, past surgical history and problem list. Problem list updated.  Objective:   Vitals:   08/21/24 0930 08/21/24 0947  BP: (!) 129/93 (!) 132/95  Pulse: 90 94  Weight: 180 lb 4.8 oz (81.8 kg)     Fetal Status:     Movement: Present     General:  Alert, oriented and cooperative. Patient is in no acute distress.  Skin: Skin is warm and dry. No rash noted.   Cardiovascular: Normal heart rate noted  Respiratory: Normal respiratory effort, no problems with respiration noted  Abdomen: Soft, gravid, appropriate for gestational age. Pain/Pressure: Present     Pelvic:  Cervical exam deferred        Extremities: Normal range of motion.  Edema: None  Mental Status: Normal mood and affect. Normal behavior. Normal judgment and thought content.   Urinalysis:      Assessment and Plan:  Pregnancy: G2P0101 at [redacted]w[redacted]d  1. Supervision of high risk pregnancy, antepartum (Primary) Rx: - Fetal nonstress test:  Reactive.  FHR 150's.  Good variability.  15x15 accels.  No decels.  2. Systemic lupus erythematosus affecting pregnancy (HCC) - clinically stable  3.  History of preterm premature rupture of membranes (PPROM)  4. History of preterm delivery, currently pregnant  5. History of cesarean section - desires TOLAC.  Consent signed.  6. History of postpartum pre-eclampsia - has mildly elevated blood pressures today - she is on her way to hospital for fetal assessment by MFM.  Will get repeat blood pressures at hospital and pre-eclampsia labs if elevated  Preterm labor symptoms and general obstetric precautions including but not limited to vaginal bleeding, contractions, leaking of fluid and fetal movement were reviewed in detail with the patient. Please refer to After Visit Summary for other counseling recommendations.   Return in about 1 week (around 08/28/2024) for [facility].  NST.SABRA   Rudy Carlin LABOR, MD 08/21/2024

## 2024-08-21 NOTE — Addendum Note (Signed)
 Addended by: WILLIAM DELORA SAILOR on: 08/21/2024 04:42 PM   Modules accepted: Orders

## 2024-08-21 NOTE — Progress Notes (Addendum)
 "  Patient information  Patient Name: Kelly [name]  Patient MRN:   [id number]  Referring practice: MFM Referring Provider: [facility]  Problem List   Patient Active Problem List   Diagnosis Date Noted   History of preterm premature rupture of membranes (PPROM) 05/01/2024   History of preterm delivery, currently pregnant 05/01/2024   Supervision of high risk pregnancy, antepartum 02/27/2024   History of postpartum pre-eclampsia 03/31/2019   History of cesarean section 03/16/2019   B12 deficiency anemia 10/14/2018   Systemic lupus erythematosus affecting pregnancy (HCC) 06/04/2012   Maternal Fetal medicine Consult  Kelly [name] is a 28 y.o. G2P0101 at [redacted]w[redacted]d here for ultrasound and consultation. [facility] [name] is doing well today with no acute concerns. Today we focused on the following:   The patient presents for a growth ultrasound and biophysical profile. Todays growth ultrasound demonstrates an estimated fetal weight at the 41st percentile, and the biophysical profile is reassuring at 8 out of 8. Blood pressure readings today are elevated, with initial measurements in the 130s/90s, including values of 139/96 and a repeat of 135/95. She has a history of preeclampsia. Today she denies headache, blurry vision, or other visual changes. We discussed concern for possible preeclampsia based on the elevated diastolic blood pressures and the need for prompt laboratory evaluation to rule out preeclampsia. The potential maternal and fetal risks associated with preeclampsia were reviewed. She was instructed to proceed directly to the maternal assessment unit (MAU) for further assessment and laboratory work. We also discussed the importance of close follow-up, including scheduling a repeat biophysical profile next week if she is not delivered.  RECOMMENDATIONS -Go directly to the MAU for evaluation and laboratory work to rule out preeclampsia -Monitor closely for symptoms of  preeclampsia, including headache, visual changes, right upper quadrant pain, or worsening blood pressure -Seek immediate evaluation for any concerning symptoms -Schedule a repeat biophysical profile weekly if not delivered -Continue close obstetric follow-up given history of preeclampsia and elevated blood pressures -Delivery around 37 weeks or sooner if indicated  The patient had time to ask questions that were answered to her satisfaction.  She verbalized understanding and agrees to proceed with the plan above.   I spent 30 minutes reviewing the patients chart, including labs and images as well as counseling the patient about her medical conditions. Greater than 50% of the time was spent in direct face-to-face patient counseling.  Kelly [name]  MFM, [place]   08/21/2024  4:36 PM   Review of Systems: A review of systems was performed and was negative except per HPI   Vitals and Physical Exam    08/21/2024    3:36 PM 08/21/2024    3:33 PM 08/21/2024    9:47 AM  Vitals with BMI  Systolic 135 139 867  Diastolic 95 96 95  Pulse 95 91 94    Sitting comfortably on the sonogram table Nonlabored breathing Normal rate and rhythm Abdomen is nontender  Past pregnancies OB History  Gravida Para Term Preterm AB Living  2 1  1  1   SAB IAB Ectopic Multiple Live Births     0 1    # Outcome Date GA Lbr Len/2nd Weight Sex Type Anes PTL Lv  2 Current           1 Preterm 03/16/19 [redacted]w[redacted]d  5 lb 4.1 oz (2.384 kg) M CS-LTranv EPI  LIV     Future Appointments  Date Time Provider Department Center  08/31/2024  2:30 PM Davis, Kelly E, PA-C [facility] None      "

## 2024-08-22 ENCOUNTER — Encounter: Payer: Self-pay | Admitting: Obstetrics

## 2024-08-24 ENCOUNTER — Other Ambulatory Visit: Payer: Self-pay

## 2024-08-24 ENCOUNTER — Encounter (HOSPITAL_COMMUNITY): Payer: Self-pay | Admitting: Obstetrics and Gynecology

## 2024-08-24 ENCOUNTER — Inpatient Hospital Stay (HOSPITAL_COMMUNITY)
Admission: AD | Admit: 2024-08-24 | Discharge: 2024-08-24 | Disposition: A | Discharge status: Home / Self Care | Payer: Self-pay | Attending: Obstetrics and Gynecology | Admitting: Obstetrics and Gynecology

## 2024-08-24 DIAGNOSIS — O34219 Maternal care for unspecified type scar from previous cesarean delivery: Secondary | ICD-10-CM | POA: Diagnosis not present

## 2024-08-24 DIAGNOSIS — O26893 Other specified pregnancy related conditions, third trimester: Secondary | ICD-10-CM

## 2024-08-24 DIAGNOSIS — M329 Systemic lupus erythematosus, unspecified: Secondary | ICD-10-CM | POA: Diagnosis not present

## 2024-08-24 DIAGNOSIS — R030 Elevated blood-pressure reading, without diagnosis of hypertension: Secondary | ICD-10-CM | POA: Diagnosis not present

## 2024-08-24 DIAGNOSIS — Z8759 Personal history of other complications of pregnancy, childbirth and the puerperium: Secondary | ICD-10-CM | POA: Diagnosis not present

## 2024-08-24 DIAGNOSIS — Z3493 Encounter for supervision of normal pregnancy, unspecified, third trimester: Secondary | ICD-10-CM

## 2024-08-24 DIAGNOSIS — Z3689 Encounter for other specified antenatal screening: Secondary | ICD-10-CM

## 2024-08-24 DIAGNOSIS — Z3A34 34 weeks gestation of pregnancy: Secondary | ICD-10-CM

## 2024-08-24 DIAGNOSIS — O99891 Other specified diseases and conditions complicating pregnancy: Secondary | ICD-10-CM | POA: Insufficient documentation

## 2024-08-24 DIAGNOSIS — O133 Gestational [pregnancy-induced] hypertension without significant proteinuria, third trimester: Secondary | ICD-10-CM

## 2024-08-24 LAB — COMPREHENSIVE METABOLIC PANEL WITH GFR
ALT: 14 U/L (ref 0–44)
AST: 20 U/L (ref 15–41)
Albumin: 3.3 g/dL — ABNORMAL LOW (ref 3.5–5.0)
Alkaline Phosphatase: 48 U/L (ref 38–126)
Anion gap: 9 (ref 5–15)
BUN: 9 mg/dL (ref 6–20)
CO2: 23 mmol/L (ref 22–32)
Calcium: 8.5 mg/dL — ABNORMAL LOW (ref 8.9–10.3)
Chloride: 104 mmol/L (ref 98–111)
Creatinine, Ser: 0.49 mg/dL (ref 0.44–1.00)
GFR, Estimated: >60 mL/min
Glucose, Bld: 73 mg/dL (ref 70–99)
Potassium: 3.6 mmol/L (ref 3.5–5.1)
Sodium: 135 mmol/L (ref 135–145)
Total Bilirubin: 0.4 mg/dL (ref 0.0–1.2)
Total Protein: 6.5 g/dL (ref 6.5–8.1)

## 2024-08-24 LAB — URINALYSIS, ROUTINE W REFLEX MICROSCOPIC
Bilirubin Urine: NEGATIVE
Glucose, UA: NEGATIVE mg/dL
Hgb urine dipstick: NEGATIVE
Ketones, ur: NEGATIVE mg/dL
Leukocytes,Ua: NEGATIVE
Nitrite: NEGATIVE
Protein, ur: NEGATIVE mg/dL
Specific Gravity, Urine: 1.018 (ref 1.005–1.030)
pH: 6 (ref 5.0–8.0)

## 2024-08-24 LAB — PROTEIN / CREATININE RATIO, URINE
Creatinine, Urine: 113 mg/dL
Protein Creatinine Ratio: 0.2 mg/mg — ABNORMAL HIGH
Total Protein, Urine: 19 mg/dL

## 2024-08-24 LAB — CBC
HCT: 29.6 % — ABNORMAL LOW (ref 36.0–46.0)
Hemoglobin: 10.1 g/dL — ABNORMAL LOW (ref 12.0–15.0)
MCH: 32.6 pg (ref 26.0–34.0)
MCHC: 34.1 g/dL (ref 30.0–36.0)
MCV: 95.5 fL (ref 80.0–100.0)
Platelets: 209 K/uL (ref 150–400)
RBC: 3.1 MIL/uL — ABNORMAL LOW (ref 3.87–5.11)
RDW: 15.3 % (ref 11.5–15.5)
WBC: 4.3 K/uL (ref 4.0–10.5)
nRBC: 0 % (ref 0.0–0.2)

## 2024-08-24 MED ORDER — ACETAMINOPHEN-CAFFEINE 500-65 MG PO TABS
2.0000 | ORAL_TABLET | Freq: Once | ORAL | Status: AC
  Administered 2024-08-24: 2 via ORAL
  Filled 2024-08-24: qty 2

## 2024-08-24 MED ORDER — EXCEDRIN TENSION HEADACHE 500-65 MG PO TABS: 2.0000 | ORAL_TABLET | Freq: Four times a day (QID) | ORAL | 0 refills | Status: DC | PRN

## 2024-08-24 NOTE — MAU Provider Note (Signed)
 Chief Complaint:  Hypertension and Headache   HPI   Event Date/Time   First Provider Initiated Contact with Patient 08/24/24 1902      Kelly [name] is a 28 y.o. G2P0101 at [redacted]w[redacted]d who presents to maternity admissions reporting elevated blood pressures since Friday. Had a headache all day, took 1g tylenol  at 9am but headache came back. Headache currently 5-6/10 on the pain scale. Bright lights make the headache worse. Denies vision changes, SOB, chest pain, RUQ pain, or leg swelling. Has been having contractions every 31min-1 hour apart today. Denies decreased fetal movement, leakage of fluid or vaginal bleeding.  Pregnancy Course: Follows at [facility] for prenatal care. Preg is compicated by SLE, hx of C/S, gestational hypertension.   Past Medical History:  Diagnosis Date   Anxiety    Cellulitis and abscess of leg 04/24/2022   Chest discomfort 09/2015   Pt states due to shortness of breath   Cough present for greater than 3 weeks    COVID-19 virus infection 04/26/2022   Gonorrhea affecting pregnancy in third trimester 03/15/2019   Treated 03/13/19     Hydronephrosis of right kidney 02/24/2019   Mild on 02/24/2019     Late Entry to [employer]- April 2020- Social Distancing  11/27/2018   Pt signed up for [employer]. Will come to office to pick up BP cuff.    Lupus (systemic lupus erythematosus) (HCC)    dx 2010   Pneumonia    2015   Shingles 06/24/2022   Shortness of breath dyspnea 09/2015   changed inhaler with relief   OB History  Gravida Para Term Preterm AB Living  2 1  1  1   SAB IAB Ectopic Multiple Live Births     0 1    # Outcome Date GA Lbr Len/2nd Weight Sex Type Anes PTL Lv  2 Current           1 Preterm 03/16/19 [redacted]w[redacted]d  2384 g M CS-LTranv EPI  LIV   Past Surgical History:  Procedure Laterality Date   BRONCHOSCOPY  11/2013   CESAREAN SECTION N/A 03/16/2019   Procedure: CESAREAN SECTION;  Surgeon: Barbra Lang PARAS, DO;  Location: [facility] LD ORS;  Service: Obstetrics;   Laterality: N/A;   WISDOM TOOTH EXTRACTION  2014   Family History  Problem Relation Age of Onset   Healthy Mother    Healthy Father    Thyroid  disease Maternal Grandmother    Lupus Maternal Grandmother    Diabetes Paternal Grandmother    Asthma Neg Hx    Cancer Neg Hx    Hypertension Neg Hx    Heart disease Neg Hx    Social History[1] Allergies[2] Medications Prior to Admission  Medication Sig Dispense Refill Last Dose/Taking   acetaminophen  (TYLENOL ) 500 MG tablet Take 1,000 mg by mouth every 6 (six) hours as needed.   08/24/2024   aspirin  EC 81 MG tablet Take 1 tablet (81 mg total) by mouth daily. Take after 12 weeks 300 tablet 2 08/24/2024   budesonide -formoterol  (SYMBICORT ) 80-4.5 MCG/ACT inhaler Inhale 2 puffs into the lungs 2 (two) times daily. Until illness clears. 1 each 12 08/24/2024   cholecalciferol (VITAMIN D3) 25 MCG (1000 UNIT) tablet Take 1,000 Units by mouth once a week.   08/24/2024   clobetasol cream (TEMOVATE) 0.05 % Apply 1 Application topically 2 (two) times daily.   08/24/2024   Cyanocobalamin  (B-12) 1000 MCG TABS Take 1 tablet (1,000 mcg total) by mouth daily. 90 tablet 2 08/24/2024  hydroxychloroquine  (PLAQUENIL ) 200 MG tablet Take 400 mg by mouth daily.   08/24/2024   pantoprazole  (PROTONIX ) 40 MG tablet TAKE 1 TABLET BY MOUTH EVERY DAY 90 tablet 1 08/24/2024   Prenatal 28-0.8 MG TABS Take 1 tablet by mouth daily. 30 tablet 12 08/24/2024   albuterol  (VENTOLIN  HFA) 108 (90 Base) MCG/ACT inhaler Inhale 2 puffs into the lungs as needed for wheezing or shortness of breath.      azaTHIOprine  (IMURAN ) 50 MG tablet Take 50 mg by mouth daily.      Blood Pressure Monitoring (BLOOD PRESSURE KIT) DEVI 1 Device by Does not apply route once a week. 1 each 0    promethazine -dextromethorphan  (PROMETHAZINE -DM) 6.25-15 MG/5ML syrup Take 2.5-5 mLs by mouth 4 (four) times daily as needed for cough. (Patient not taking: Reported on 08/21/2024) 118 mL 0     I have reviewed  patient's Past Medical Hx, Surgical Hx, Family Hx, Social Hx, medications and allergies.   ROS  Pertinent items noted in HPI and remainder of comprehensive ROS otherwise negative.   PHYSICAL EXAM  Patient Vitals for the past 24 hrs:  BP Pulse Resp SpO2 Height Weight  08/24/24 2116 121/86 78 -- -- -- --  08/24/24 2101 126/88 85 -- -- -- --  08/24/24 2046 129/86 89 -- -- -- --  08/24/24 2031 127/85 90 -- -- -- --  08/24/24 2016 126/83 86 -- -- -- --  08/24/24 2001 (!) 127/90 93 -- -- -- --  08/24/24 1946 113/80 86 -- -- -- --  08/24/24 1931 (!) 128/90 88 -- -- -- --  08/24/24 1918 134/86 93 -- -- -- --  08/24/24 1901 (!) 129/92 87 -- -- -- --  08/24/24 1853 (!) 137/99 85 -- -- -- --  08/24/24 1822 (!) 130/90 91 20 100 % -- --  08/24/24 1818 -- -- -- -- 5' 2 (1.575 m) 83.7 kg    Constitutional: Well-developed, well-nourished female in no acute distress.  Cardiovascular: normal rate & rhythm, warm and well-perfused Respiratory: normal effort, no problems with respiration noted GI: Abd soft, non-tender, non-distended MS: Extremities nontender, no edema, normal ROM Neurologic: Alert and oriented x 4.  GU: no CVA tenderness Pelvic: normal external female genitalia, physiologic discharge, no blood, cervix clean.      Fetal Tracing: Baseline: Variability: Accelerations:  Decelerations: Toco:    Labs: Results for orders placed or performed during the hospital encounter of 08/24/24 (from the past 24 hours)  Urinalysis, Routine w reflex microscopic -Urine, Clean Catch     Status: Abnormal   Collection Time: 08/24/24  6:30 PM  Result Value Ref Range   Color, Urine YELLOW YELLOW   APPearance HAZY (A) CLEAR   Specific Gravity, Urine 1.018 1.005 - 1.030   pH 6.0 5.0 - 8.0   Glucose, UA NEGATIVE NEGATIVE mg/dL   Hgb urine dipstick NEGATIVE NEGATIVE   Bilirubin Urine NEGATIVE NEGATIVE   Ketones, ur NEGATIVE NEGATIVE mg/dL   Protein, ur NEGATIVE NEGATIVE mg/dL   Nitrite NEGATIVE  NEGATIVE   Leukocytes,Ua NEGATIVE NEGATIVE  Protein / creatinine ratio, urine     Status: Abnormal   Collection Time: 08/24/24  7:05 PM  Result Value Ref Range   Creatinine, Urine 113 mg/dL   Total Protein, Urine 19 mg/dL   Protein Creatinine Ratio 0.2 (H) <0.2 mg/mg  CBC     Status: Abnormal   Collection Time: 08/24/24  7:15 PM  Result Value Ref Range   WBC 4.3 4.0 - 10.5 K/uL  RBC 3.10 (L) 3.87 - 5.11 MIL/uL   Hemoglobin 10.1 (L) 12.0 - 15.0 g/dL   HCT 70.3 (L) 63.9 - 53.9 %   MCV 95.5 80.0 - 100.0 fL   MCH 32.6 26.0 - 34.0 pg   MCHC 34.1 30.0 - 36.0 g/dL   RDW 84.6 88.4 - 84.4 %   Platelets 209 150 - 400 K/uL   nRBC 0.0 0.0 - 0.2 %  Comprehensive metabolic panel with GFR     Status: Abnormal   Collection Time: 08/24/24  7:15 PM  Result Value Ref Range   Sodium 135 135 - 145 mmol/L   Potassium 3.6 3.5 - 5.1 mmol/L   Chloride 104 98 - 111 mmol/L   CO2 23 22 - 32 mmol/L   Glucose, Bld 73 70 - 99 mg/dL   BUN 9 6 - 20 mg/dL   Creatinine, Ser 9.50 0.44 - 1.00 mg/dL   Calcium  8.5 (L) 8.9 - 10.3 mg/dL   Total Protein 6.5 6.5 - 8.1 g/dL   Albumin 3.3 (L) 3.5 - 5.0 g/dL   AST 20 15 - 41 U/L   ALT 14 0 - 44 U/L   Alkaline Phosphatase 48 38 - 126 U/L   Total Bilirubin 0.4 0.0 - 1.2 mg/dL   GFR, Estimated >39 >39 mL/min   Anion gap 9 5 - 15    Imaging:  No results found.  MDM & MAU COURSE  MDM: Moderate  MAU Course: Orders Placed This Encounter  Procedures   Urinalysis, Routine w reflex microscopic -Urine, Clean Catch   Protein / creatinine ratio, urine   CBC   Comprehensive metabolic panel with GFR   Meds ordered this encounter  Medications   acetaminophen -caffeine  (EXCEDRIN TENSION HEADACHE) 500-65 MG per tablet 2 tablet   Blood pressures on arrival showing elevated diastolic pressures, exam unremarkable.  Preeclampsia labs collected, serial blood pressures every 15 minutes. 9 PM-CBC and CMP unremarkable.  Urine protein creatinine ratio pending.  Patient signed  out to Dr. Danny for further evaluation. ASSESSMENT   1. [redacted] weeks gestation of pregnancy     PLAN  Discharge home in stable condition with return precautions.      Allergies as of 08/24/2024       Reactions   Doxycycline  Hives, Rash, Other (See Comments)   Blister patches on skin Bullous eruption, required hospital treatment 01/2022   Methotrexate Other (See Comments)   Other Reaction(s): hairloss        Medication List     STOP taking these medications    promethazine -dextromethorphan  6.25-15 MG/5ML syrup Commonly known as: PROMETHAZINE -DM       TAKE these medications    acetaminophen  500 MG tablet Commonly known as: TYLENOL  Take 1,000 mg by mouth every 6 (six) hours as needed.   albuterol  108 (90 Base) MCG/ACT inhaler Commonly known as: VENTOLIN  HFA Inhale 2 puffs into the lungs as needed for wheezing or shortness of breath.   aspirin  EC 81 MG tablet Take 1 tablet (81 mg total) by mouth daily. Take after 12 weeks   azaTHIOprine  50 MG tablet Commonly known as: IMURAN  Take 50 mg by mouth daily.   B-12 1000 MCG Tabs Take 1 tablet (1,000 mcg total) by mouth daily.   Blood Pressure Kit Devi 1 Device by Does not apply route once a week.   budesonide -formoterol  80-4.5 MCG/ACT inhaler Commonly known as: Symbicort  Inhale 2 puffs into the lungs 2 (two) times daily. Until illness clears.   cholecalciferol 25 MCG (1000  UNIT) tablet Commonly known as: VITAMIN D3 Take 1,000 Units by mouth once a week.   clobetasol cream 0.05 % Commonly known as: TEMOVATE Apply 1 Application topically 2 (two) times daily.   hydroxychloroquine  200 MG tablet Commonly known as: PLAQUENIL  Take 400 mg by mouth daily.   pantoprazole  40 MG tablet Commonly known as: PROTONIX  TAKE 1 TABLET BY MOUTH EVERY DAY   Prenatal 28-0.8 MG Tabs Take 1 tablet by mouth daily.        Charlie Courts, MD  Family Medicine - Obstetrics Fellow        [1]  Social History Tobacco Use    Smoking status: Never   Smokeless tobacco: Never  Vaping Use   Vaping status: Never Used  Substance Use Topics   Alcohol use: Not Currently    Comment: occ   Drug use: Not Currently    Types: Marijuana    Comment: last use sep 2024  [2]  Allergies Allergen Reactions   Doxycycline  Hives, Rash and Other (See Comments)    Blister patches on skin Bullous eruption, required hospital treatment 01/2022   Methotrexate Other (See Comments)    Other Reaction(s): hairloss

## 2024-08-24 NOTE — Discharge Instructions (Signed)
 You came into the hospital because you are having elevated blood pressures at home.  We did preeclampsia labs which were negative.  Her blood pressures were overall normal while you are in the hospital.  We discussed possibly starting medication because your diastolic pressures have been above 90.  After our conversation, we decided not to start this medication at this time.  Please let your provider know if you change your mind or if your blood pressures worsen as that may be beneficial.  No PE of a happy and safe holiday.

## 2024-08-24 NOTE — MAU Note (Signed)
.  Kelly [name] is a 28 y.o. at [redacted]w[redacted]d here in MAU reporting: states she has gestational hypertension, still has high blood pressure, but now have headache 6/10 took 1000 mg of tylenol  at 0800. States it helped for a little bit, but came rigth back. Feeling whoozy contraction 8/10 irregularly occurring   Onset of complaint: today  Pain score: 6/10 Vitals:   08/24/24 1822  BP: (!) 130/90  Pulse: 91  Resp: 20  SpO2: 100%     FHT:139 Lab orders placed from triage:

## 2024-08-31 ENCOUNTER — Ambulatory Visit (INDEPENDENT_AMBULATORY_CARE_PROVIDER_SITE_OTHER): Admitting: Physician Assistant

## 2024-08-31 ENCOUNTER — Encounter: Payer: Self-pay | Admitting: Obstetrics and Gynecology

## 2024-08-31 ENCOUNTER — Encounter: Payer: Self-pay | Admitting: Physician Assistant

## 2024-08-31 VITALS — BP 132/95 | HR 98 | Wt 184.2 lb

## 2024-08-31 DIAGNOSIS — M329 Systemic lupus erythematosus, unspecified: Secondary | ICD-10-CM | POA: Diagnosis not present

## 2024-08-31 DIAGNOSIS — O139 Gestational [pregnancy-induced] hypertension without significant proteinuria, unspecified trimester: Secondary | ICD-10-CM

## 2024-08-31 DIAGNOSIS — D519 Vitamin B12 deficiency anemia, unspecified: Secondary | ICD-10-CM

## 2024-08-31 DIAGNOSIS — O0993 Supervision of high risk pregnancy, unspecified, third trimester: Secondary | ICD-10-CM

## 2024-08-31 DIAGNOSIS — Z8679 Personal history of other diseases of the circulatory system: Secondary | ICD-10-CM

## 2024-08-31 DIAGNOSIS — Z8759 Personal history of other complications of pregnancy, childbirth and the puerperium: Secondary | ICD-10-CM | POA: Diagnosis not present

## 2024-08-31 DIAGNOSIS — O09893 Supervision of other high risk pregnancies, third trimester: Secondary | ICD-10-CM

## 2024-08-31 DIAGNOSIS — O133 Gestational [pregnancy-induced] hypertension without significant proteinuria, third trimester: Secondary | ICD-10-CM

## 2024-08-31 DIAGNOSIS — Z98891 History of uterine scar from previous surgery: Secondary | ICD-10-CM | POA: Diagnosis not present

## 2024-08-31 DIAGNOSIS — O0990 Supervision of high risk pregnancy, unspecified, unspecified trimester: Secondary | ICD-10-CM

## 2024-08-31 DIAGNOSIS — Z3A35 35 weeks gestation of pregnancy: Secondary | ICD-10-CM

## 2024-08-31 DIAGNOSIS — O99891 Other specified diseases and conditions complicating pregnancy: Secondary | ICD-10-CM | POA: Diagnosis not present

## 2024-08-31 DIAGNOSIS — O09899 Supervision of other high risk pregnancies, unspecified trimester: Secondary | ICD-10-CM

## 2024-08-31 LAB — POCT URINALYSIS DIPSTICK
Bilirubin, UA: NEGATIVE
Blood, UA: NEGATIVE
Glucose, UA: NEGATIVE
Ketones, UA: NEGATIVE
Leukocytes, UA: NEGATIVE
Nitrite, UA: NEGATIVE
Protein, UA: NEGATIVE
Spec Grav, UA: 1.015
Urobilinogen, UA: 0.2 U/dL
pH, UA: 7

## 2024-08-31 NOTE — Progress Notes (Unsigned)
 NST; pt has lupus.   Pt concerned she may either not be fully emptying her bladder or leaking fluid. She reports fluid output continuing after she feels she has thoroughly urinated.   Pt reports pressure (scale 9 of 10) in lower pelvis/ vagina. Pt reports first child delivered spontaneously at 35 weeks.   Pt reports she was diagnosed with gHTN and discussed induction of 37 weeks with a provider at the MAU 08/21/24.    BP in office today 125/93 and 132/95

## 2024-08-31 NOTE — Progress Notes (Unsigned)
 "  PRENATAL VISIT NOTE  Subjective:  Kelly [name] is a 29 y.o. G2P0101 at [redacted]w[redacted]d being seen today for ongoing prenatal care.  She is currently monitored for the following issues for this high-risk pregnancy and has B12 deficiency anemia; History of cesarean section; History of postpartum pre-eclampsia; Supervision of high risk pregnancy, antepartum; History of preterm premature rupture of membranes (PPROM); History of preterm delivery, currently pregnant; Systemic lupus erythematosus affecting pregnancy (HCC); and Gestational hypertension on their problem list.  Patient reports feeling of not urinating completely, like some urine is always left over. She denies dysuria, urinary urgency.    Contractions: Irritability. Vag. Bleeding: None.  Movement: Present. Denies leaking of fluid.   The following portions of the patient's history were reviewed and updated as appropriate: allergies, current medications, past family history, past medical history, past social history, past surgical history and problem list.   Objective:   Vitals:   08/31/24 1441 08/31/24 1455  BP: (!) 125/93 (!) 132/95  Pulse: 95 98  Weight: 184 lb 3.2 oz (83.6 kg)     Fetal Status:  Fetal Heart Rate (bpm): 145 Fundal Height: 35 cm Movement: Present    General: Alert, oriented and cooperative. Patient is in no acute distress.  Skin: Skin is warm and dry. No rash noted.   Cardiovascular: Normal heart rate noted  Respiratory: Normal respiratory effort, no problems with respiration noted  Abdomen: Soft, gravid, appropriate for gestational age.  Pain/Pressure: Present     Pelvic: Cervical exam performed in the presence of a chaperone        Extremities: Normal range of motion.  Edema: Trace  Mental Status: Normal mood and affect. Normal behavior. Normal judgment and thought content.      02/27/2024    9:03 AM 07/12/2022    3:47 PM 06/01/2022   11:27 AM  Depression screen PHQ 2/9  Decreased Interest 0 0 0  Down,  Depressed, Hopeless 0 0 0  PHQ - 2 Score 0 0 0  Altered sleeping 0 1 0  Tired, decreased energy 0 1 1  Change in appetite 0 0 0  Feeling bad or failure about yourself  0 0 0  Trouble concentrating 1 0 1  Moving slowly or fidgety/restless 0 0 0  Suicidal thoughts 0 0 0  PHQ-9 Score 1  2  2       Data saved with a previous flowsheet row definition        02/27/2024    9:07 AM 07/12/2022    3:48 PM 06/01/2022   11:27 AM  GAD 7 : Generalized Anxiety Score  Nervous, Anxious, on Edge 0 0 0  Control/stop worrying 1 0 0  Worry too much - different things 1 0 0  Trouble relaxing 0 0 0  Restless 0 1 0  Easily annoyed or irritable 0 0 0  Afraid - awful might happen 0 0 0  Total GAD 7 Score 2 1 0    Assessment and Plan:  Pregnancy: G2P0101 at [redacted]w[redacted]d  1. Supervision of high risk pregnancy, antepartum (Primary) Patient doing well, feeling regular fetal movement BP, FHR, FH appropriate  Urinalysis negative  2. [redacted] weeks gestation of pregnancy Anticipatory guidance about next visits/weeks of pregnancy given.   3. Systemic lupus erythematosus affecting pregnancy (HCC) On hydroxychloroquine  400 mg, azathioprine  50 mg, and prednisone  2.5 mg daily Last saw [facility] 12/1 Normal baseline pre-E labs NST Category I today Continue weekly antenatal testing- next appt 1/8 Continue ASA  4. Gestational hypertension, antepartum 5. History of postpartum pre-eclampsia Mild range pressures Continue home Bps Continue ASA Precautions discussed  Delivery at 37 weeks- sent induction form for 09/12/23  6. Anemia due to vitamin B12 deficiency, unspecified B12 deficiency type 1,000 mcg B12 by mouth daily   7. History of cesarean section Prefers TOLAC Consent available in chart  8. History of preterm premature rupture of membranes (PPROM) 9. History of preterm delivery, currently pregnant No ssxs PTL Cervical exam today with 20/1.5/firm  Preterm labor symptoms and general obstetric  precautions including but not limited to vaginal bleeding, contractions, leaking of fluid and fetal movement were reviewed in detail with the patient.  Please refer to After Visit Summary for other counseling recommendations.   Return in about 1 week (around 09/07/2024) for [facility].  Future Appointments  Date Time Provider Department Center  09/03/2024 11:15 AM [facility] PROVIDER 1 [facility]  09/03/2024 11:30 AM [facility] US4 [facility]  09/10/2024  2:50 PM Nazly Digilio E, PA-C [facility] None  09/12/2024 12:00 AM [facility] SCHED ROOM [facility] None     Jorene FORBES Moats, PA-C  "

## 2024-09-01 LAB — PROTEIN / CREATININE RATIO, URINE
Creatinine, Urine: 75.5 mg/dL
Protein, Ur: 13.1 mg/dL
Protein/Creat Ratio: 174 mg/g{creat} (ref 0–200)

## 2024-09-02 ENCOUNTER — Ambulatory Visit

## 2024-09-03 ENCOUNTER — Ambulatory Visit: Attending: Obstetrics and Gynecology | Admitting: Obstetrics

## 2024-09-03 ENCOUNTER — Ambulatory Visit (HOSPITAL_BASED_OUTPATIENT_CLINIC_OR_DEPARTMENT_OTHER)

## 2024-09-03 ENCOUNTER — Other Ambulatory Visit: Payer: Self-pay | Admitting: *Deleted

## 2024-09-03 VITALS — BP 127/90 | HR 91

## 2024-09-03 VITALS — BP 131/85 | HR 92

## 2024-09-03 DIAGNOSIS — O139 Gestational [pregnancy-induced] hypertension without significant proteinuria, unspecified trimester: Secondary | ICD-10-CM | POA: Insufficient documentation

## 2024-09-03 DIAGNOSIS — O133 Gestational [pregnancy-induced] hypertension without significant proteinuria, third trimester: Secondary | ICD-10-CM | POA: Diagnosis not present

## 2024-09-03 DIAGNOSIS — O99891 Other specified diseases and conditions complicating pregnancy: Secondary | ICD-10-CM | POA: Diagnosis not present

## 2024-09-03 DIAGNOSIS — Z3A35 35 weeks gestation of pregnancy: Secondary | ICD-10-CM | POA: Insufficient documentation

## 2024-09-03 DIAGNOSIS — M329 Systemic lupus erythematosus, unspecified: Secondary | ICD-10-CM | POA: Insufficient documentation

## 2024-09-03 DIAGNOSIS — O34219 Maternal care for unspecified type scar from previous cesarean delivery: Secondary | ICD-10-CM

## 2024-09-03 DIAGNOSIS — O35BXX0 Maternal care for other (suspected) fetal abnormality and damage, fetal cardiac anomalies, not applicable or unspecified: Secondary | ICD-10-CM

## 2024-09-03 DIAGNOSIS — O0990 Supervision of high risk pregnancy, unspecified, unspecified trimester: Secondary | ICD-10-CM

## 2024-09-03 DIAGNOSIS — O0993 Supervision of high risk pregnancy, unspecified, third trimester: Secondary | ICD-10-CM | POA: Diagnosis not present

## 2024-09-03 NOTE — Progress Notes (Signed)
 MFM Consult Note  Kelly [name] is currently at [redacted]w[redacted]d. She has been followed due to maternal lupus and recently diagnosed gestational hypertension.    The patient was evaluated for preeclampsia few days ago.  Her P/C ratio did not indicate significant proteinuria and her PIH labs were all within normal limits.  The patient reports that she experiences frequent headaches which resolves after taking Excedrin  headache.  She reports feeling fetal movements throughout the day.    Her blood pressures today were 127/90 and 131/85.  Sonographic findings Single intrauterine pregnancy at 35w 1d. Fetal cardiac activity: Observed. Presentation: Cephalic. Amniotic fluid: Within normal limits. AFI: 14.12 cm,  MVP: 5.04 cm. Placenta: Posterior. BPP: 8/8.   Preeclampsia precautions were reviewed today.  The patient was advised to go to the hospital should she complain of any signs or symptoms of preeclampsia.    Due to gestational hypertension, she is already scheduled for induction next Saturday, September 12, 2024.  She will return in 1 week for a modified BPP.  The patient stated that all of her questions were answered.   A total of 20 minutes was spent counseling and coordinating the care for this patient.  Greater than 50% of the time was spent in direct face-to-face contact.

## 2024-09-07 ENCOUNTER — Telehealth (HOSPITAL_COMMUNITY): Payer: Self-pay | Admitting: *Deleted

## 2024-09-07 ENCOUNTER — Encounter (HOSPITAL_COMMUNITY): Payer: Self-pay | Admitting: *Deleted

## 2024-09-07 NOTE — Telephone Encounter (Signed)
 Preadmission screen

## 2024-09-10 ENCOUNTER — Other Ambulatory Visit (HOSPITAL_COMMUNITY)
Admission: RE | Admit: 2024-09-10 | Discharge: 2024-09-10 | Disposition: A | Source: Ambulatory Visit | Attending: Physician Assistant | Admitting: Physician Assistant

## 2024-09-10 ENCOUNTER — Other Ambulatory Visit

## 2024-09-10 ENCOUNTER — Ambulatory Visit: Payer: Self-pay | Admitting: Physician Assistant

## 2024-09-10 VITALS — BP 130/93 | HR 98 | Wt 182.3 lb

## 2024-09-10 DIAGNOSIS — Z3A00 Weeks of gestation of pregnancy not specified: Secondary | ICD-10-CM | POA: Diagnosis not present

## 2024-09-10 DIAGNOSIS — O133 Gestational [pregnancy-induced] hypertension without significant proteinuria, third trimester: Secondary | ICD-10-CM | POA: Diagnosis not present

## 2024-09-10 DIAGNOSIS — M329 Systemic lupus erythematosus, unspecified: Secondary | ICD-10-CM | POA: Diagnosis not present

## 2024-09-10 DIAGNOSIS — O0990 Supervision of high risk pregnancy, unspecified, unspecified trimester: Secondary | ICD-10-CM | POA: Diagnosis present

## 2024-09-10 DIAGNOSIS — O99891 Other specified diseases and conditions complicating pregnancy: Secondary | ICD-10-CM | POA: Diagnosis not present

## 2024-09-10 DIAGNOSIS — Z8679 Personal history of other diseases of the circulatory system: Secondary | ICD-10-CM | POA: Diagnosis not present

## 2024-09-10 DIAGNOSIS — O0993 Supervision of high risk pregnancy, unspecified, third trimester: Secondary | ICD-10-CM | POA: Diagnosis not present

## 2024-09-10 DIAGNOSIS — Z8759 Personal history of other complications of pregnancy, childbirth and the puerperium: Secondary | ICD-10-CM | POA: Diagnosis not present

## 2024-09-10 DIAGNOSIS — O09893 Supervision of other high risk pregnancies, third trimester: Secondary | ICD-10-CM | POA: Diagnosis not present

## 2024-09-10 DIAGNOSIS — Z98891 History of uterine scar from previous surgery: Secondary | ICD-10-CM | POA: Diagnosis not present

## 2024-09-10 DIAGNOSIS — Z3A36 36 weeks gestation of pregnancy: Secondary | ICD-10-CM | POA: Diagnosis not present

## 2024-09-10 DIAGNOSIS — O09899 Supervision of other high risk pregnancies, unspecified trimester: Secondary | ICD-10-CM

## 2024-09-10 NOTE — Progress Notes (Signed)
 "  PRENATAL VISIT NOTE  Subjective:  Kelly [name] is a 29 y.o. G2P0101 at [redacted]w[redacted]d being seen today for ongoing prenatal care.  She is currently monitored for the following issues for this high-risk pregnancy and has B12 deficiency anemia; History of cesarean section; History of postpartum pre-eclampsia; Supervision of high risk pregnancy, antepartum; History of preterm premature rupture of membranes (PPROM); History of preterm delivery, currently pregnant; Systemic lupus erythematosus affecting pregnancy (HCC); and Gestational hypertension on their problem list.  Patient reports no complaints.  Contractions: Irritability. Vag. Bleeding: None.  Movement: Present. Denies leaking of fluid.   The following portions of the patient's history were reviewed and updated as appropriate: allergies, current medications, past family history, past medical history, past social history, past surgical history and problem list.   Objective:   Vitals:   09/10/24 1516 09/10/24 1526  BP: (!) 137/97 (!) 130/93  Pulse: 90 98  Weight: 182 lb 4.8 oz (82.7 kg)     Fetal Status:  Fetal Heart Rate (bpm): 138   Movement: Present    General: Alert, oriented and cooperative. Patient is in no acute distress.  Skin: Skin is warm and dry. No rash noted.   Cardiovascular: Normal heart rate noted  Respiratory: Normal respiratory effort, no problems with respiration noted  Abdomen: Soft, gravid, appropriate for gestational age.  Pain/Pressure: Present     Pelvic: Cervical exam performed in the presence of a chaperone 20/1.5       Extremities: Normal range of motion.  Edema: None  Mental Status: Normal mood and affect. Normal behavior. Normal judgment and thought content.      02/27/2024    9:03 AM 07/12/2022    3:47 PM 06/01/2022   11:27 AM  Depression screen PHQ 2/9  Decreased Interest 0 0 0  Down, Depressed, Hopeless 0 0 0  PHQ - 2 Score 0 0 0  Altered sleeping 0 1 0  Tired, decreased energy 0 1 1  Change in  appetite 0 0 0  Feeling bad or failure about yourself  0 0 0  Trouble concentrating 1 0 1  Moving slowly or fidgety/restless 0 0 0  Suicidal thoughts 0 0 0  PHQ-9 Score 1  2  2       Data saved with a previous flowsheet row definition        02/27/2024    9:07 AM 07/12/2022    3:48 PM 06/01/2022   11:27 AM  GAD 7 : Generalized Anxiety Score  Nervous, Anxious, on Edge 0 0 0  Control/stop worrying 1 0 0  Worry too much - different things 1 0 0  Trouble relaxing 0 0 0  Restless 0 1 0  Easily annoyed or irritable 0 0 0  Afraid - awful might happen 0 0 0  Total GAD 7 Score 2 1 0    Assessment and Plan:  Pregnancy: G2P0101 at [redacted]w[redacted]d  1. Supervision of high risk pregnancy, antepartum (Primary) Patient doing well, feeling regular fetal movement BP, FHR, FH appropriate   2. [redacted] weeks gestation of pregnancy Anticipatory guidance about next visits/weeks of pregnancy given.   3. Gestational hypertension, third trimester 4. History of postpartum pre-eclampsia IOL 09/13/23 Was supposed to go for BPP  5. History of cesarean section Prefers TOLAC Consent available in chart   6. Systemic lupus erythematosus affecting pregnancy (HCC) On hydroxychloroquine  400 mg, azathioprine  50 mg, and prednisone  2.5 mg daily Last saw [facility] 12/1 Normal baseline pre-E labs NST with  MFM tomorrow  IOL in two days Continue ASA  7. History of preterm premature rupture of membranes (PPROM) 8. History of preterm delivery, currently pregnant No ssxs PTL  Preterm labor symptoms and general obstetric precautions including but not limited to vaginal bleeding, contractions, leaking of fluid and fetal movement were reviewed in detail with the patient.  Please refer to After Visit Summary for other counseling recommendations.   Return in about 1 week (around 09/17/2024) for [facility].  Future Appointments  Date Time Provider Department Center  09/11/2024 10:30 AM [facility] PROVIDER 1 [facility]   09/11/2024 10:45 AM [facility] NST [facility]  09/12/2024 12:00 AM [facility] SCHED ROOM [facility] None  10/19/2024  2:30 PM Erik Kieth BROCKS, MD [facility] None    Jorene FORBES Moats, PA-C  "

## 2024-09-10 NOTE — Progress Notes (Signed)
 Pt presents for rob. Pt wants to be checked for dilation, pt is being induced on 01/17. Pt has no other questions or concerns at this time.

## 2024-09-11 ENCOUNTER — Ambulatory Visit

## 2024-09-11 ENCOUNTER — Encounter (HOSPITAL_COMMUNITY): Payer: Self-pay | Admitting: Obstetrics & Gynecology

## 2024-09-11 ENCOUNTER — Encounter: Payer: Self-pay | Admitting: Obstetrics and Gynecology

## 2024-09-11 ENCOUNTER — Other Ambulatory Visit

## 2024-09-11 ENCOUNTER — Inpatient Hospital Stay (HOSPITAL_COMMUNITY)
Admission: AD | Admit: 2024-09-11 | Discharge: 2024-09-11 | Disposition: A | Discharge status: Home / Self Care | Source: Home / Self Care | Attending: Obstetrics & Gynecology | Admitting: Obstetrics & Gynecology

## 2024-09-11 DIAGNOSIS — R519 Headache, unspecified: Secondary | ICD-10-CM | POA: Diagnosis not present

## 2024-09-11 DIAGNOSIS — R0981 Nasal congestion: Secondary | ICD-10-CM

## 2024-09-11 DIAGNOSIS — Z3689 Encounter for other specified antenatal screening: Secondary | ICD-10-CM

## 2024-09-11 DIAGNOSIS — O133 Gestational [pregnancy-induced] hypertension without significant proteinuria, third trimester: Secondary | ICD-10-CM

## 2024-09-11 DIAGNOSIS — O26893 Other specified pregnancy related conditions, third trimester: Secondary | ICD-10-CM

## 2024-09-11 DIAGNOSIS — Z3A37 37 weeks gestation of pregnancy: Secondary | ICD-10-CM

## 2024-09-11 DIAGNOSIS — J0110 Acute frontal sinusitis, unspecified: Secondary | ICD-10-CM | POA: Insufficient documentation

## 2024-09-11 DIAGNOSIS — O99513 Diseases of the respiratory system complicating pregnancy, third trimester: Secondary | ICD-10-CM | POA: Insufficient documentation

## 2024-09-11 MED ORDER — AMOXICILLIN-POT CLAVULANATE 875-125 MG PO TABS: 1.0000 | ORAL_TABLET | Freq: Two times a day (BID) | ORAL | 0 refills | Status: AC

## 2024-09-11 MED ORDER — FLUTICASONE PROPIONATE 50 MCG/ACT NA SUSP: 2.0000 | Freq: Every day | NASAL | 0 refills | Status: AC

## 2024-09-11 NOTE — MAU Provider Note (Signed)
 Chief Complaint:  Facial Pain   HPI     Kelly [name] is a 29 y.o. G2P0101 at [redacted]w[redacted]d who presents to maternity admissions reporting that she has had left-sided sinus pressure in the face and radiating to her tooth/ear and is complaining of nasal congestion with a headache.  She reports this has been going on for approximately 1 week and has not taken any medication for it.    She reports that she has no obstetrical issues and is not complaining of contractions, leaking fluid, vaginal bleeding and reports good fetal movements.  Patient states she is scheduled for induction of labor tonight for gestational hypertension.  She is rating her pain score 10 out of 10 with sinus congestion and pain. No c/o visual changes, RUQ pain, N/V, SOB, or chest pain    Pregnancy Course: [place]  Past Medical History:  Diagnosis Date   Anxiety    Cellulitis and abscess of leg 04/24/2022   Chest discomfort 09/2015   Pt states due to shortness of breath   Cough present for greater than 3 weeks    COVID-19 virus infection 04/26/2022   Gonorrhea affecting pregnancy in third trimester 03/15/2019   Treated 03/13/19     Hydronephrosis of right kidney 02/24/2019   Mild on 02/24/2019     Late Entry to [employer]- April 2020- Social Distancing  11/27/2018   Pt signed up for [employer]. Will come to office to pick up BP cuff.    Lupus (systemic lupus erythematosus) (HCC)    dx 2010   Pneumonia    2015   Pregnancy induced hypertension    Shingles 06/24/2022   Shortness of breath dyspnea 09/2015   changed inhaler with relief   OB History  Gravida Para Term Preterm AB Living  2 1  1  1   SAB IAB Ectopic Multiple Live Births     0 1    # Outcome Date GA Lbr Len/2nd Weight Sex Type Anes PTL Lv  2 Current           1 Preterm 03/16/19 [redacted]w[redacted]d  2384 g M CS-LTranv EPI  LIV   Past Surgical History:  Procedure Laterality Date   BRONCHOSCOPY  11/2013   CESAREAN SECTION N/A 03/16/2019   Procedure: CESAREAN  SECTION;  Surgeon: Barbra Lang PARAS, DO;  Location: [facility];  Service: Obstetrics;  Laterality: N/A;   WISDOM TOOTH EXTRACTION  2014   Family History  Problem Relation Age of Onset   Healthy Mother    Healthy Father    Thyroid  disease Maternal Grandmother    Lupus Maternal Grandmother    Diabetes Paternal Grandmother    Asthma Neg Hx    Cancer Neg Hx    Hypertension Neg Hx    Heart disease Neg Hx    Social History[1] Allergies[2] Medications Prior to Admission  Medication Sig Dispense Refill Last Dose/Taking   acetaminophen  (TYLENOL ) 500 MG tablet Take 1,000 mg by mouth every 6 (six) hours as needed.   09/11/2024 Morning   acetaminophen -caffeine  (EXCEDRIN  TENSION HEADACHE) 500-65 MG TABS per tablet Take 2 tablets by mouth every 6 (six) hours as needed (Headache). 60 tablet 0 09/11/2024 Morning   aspirin  EC 81 MG tablet Take 1 tablet (81 mg total) by mouth daily. Take after 12 weeks 300 tablet 2 09/10/2024   budesonide -formoterol  (SYMBICORT ) 80-4.5 MCG/ACT inhaler Inhale 2 puffs into the lungs 2 (two) times daily. Until illness clears. 1 each 12 09/10/2024   pantoprazole  (PROTONIX ) 40 MG tablet  TAKE 1 TABLET BY MOUTH EVERY DAY 90 tablet 1 09/10/2024   Prenatal 28-0.8 MG TABS Take 1 tablet by mouth daily. 30 tablet 12 09/10/2024   albuterol  (VENTOLIN  HFA) 108 (90 Base) MCG/ACT inhaler Inhale 2 puffs into the lungs as needed for wheezing or shortness of breath.   Unknown   azaTHIOprine  (IMURAN ) 50 MG tablet Take 50 mg by mouth daily.   Unknown   Blood Pressure Monitoring (BLOOD PRESSURE KIT) DEVI 1 Device by Does not apply route once a week. (Patient not taking: Reported on 09/10/2024) 1 each 0    cholecalciferol (VITAMIN D3) 25 MCG (1000 UNIT) tablet Take 1,000 Units by mouth once a week.   Unknown   clobetasol cream (TEMOVATE) 0.05 % Apply 1 Application topically 2 (two) times daily.   Unknown   Cyanocobalamin  (B-12) 1000 MCG TABS Take 1 tablet (1,000 mcg total) by mouth daily. 90 tablet 2  Unknown   hydroxychloroquine  (PLAQUENIL ) 200 MG tablet Take 400 mg by mouth daily.   Unknown    I have reviewed patient's Past Medical Hx, Surgical Hx, Family Hx, Social Hx, medications and allergies.   ROS  Pertinent items noted in HPI and remainder of comprehensive ROS otherwise negative.   PHYSICAL EXAM  Patient Vitals for the past 24 hrs:  BP Temp Temp src Pulse Resp SpO2  09/11/24 0609 (!) 125/96 98.1 F (36.7 C) Oral 95 16 100 %    Physical Exam Vitals and nursing note reviewed.  Constitutional:      General: She is not in acute distress.    Appearance: Normal appearance. She is ill-appearing.  HENT:     Nose: Nasal tenderness and mucosal edema present.     Right Sinus: Maxillary sinus tenderness and frontal sinus tenderness present.     Left Sinus: Maxillary sinus tenderness and frontal sinus tenderness present.  Pulmonary:     Effort: Pulmonary effort is normal.  Musculoskeletal:        General: Normal range of motion.  Skin:    General: Skin is warm.  Neurological:     Mental Status: She is oriented to person, place, and time.  Psychiatric:        Behavior: Behavior normal.         Fetal Tracing: NST Reactive Baseline:135-140 Variability: moderate  Accelerations: present Decelerations:absent Toco: quite    MDM & MAU COURSE  MDM:  LOW/ NST  Sinus infection in pregnancy  NST for gestational age and fetal reassurance-reactive  Will plan to discharge patient home on Augmentin  as directed and Flonase  nasal spray. Patient has scheduled induction tonight for gestational hypertension Labs deferred till tonight She is asymptomatic for signs and symptoms of preeclampsia at this time  MAU Course: Orders Placed This Encounter  Procedures   Discharge patient Discharge disposition: 01-Home or Self Care; Discharge patient date: 09/11/2024   Meds ordered this encounter  Medications   amoxicillin -clavulanate (AUGMENTIN ) 875-125 MG tablet    Sig: Take 1  tablet by mouth every 12 (twelve) hours.    Dispense:  14 tablet    Refill:  0    Supervising Provider:   PRATT, TANYA S [[id number]]   fluticasone  (FLONASE ) 50 MCG/ACT nasal spray    Sig: Place 2 sprays into both nostrils daily.    Dispense:  1 g    Refill:  0    Supervising Provider:   PRATT, TANYA S [[id number]]     I have reviewed the patient chart and performed the physical exam .  I have ordered & interpreted the lab results and reviewed and interpreted the NST  ASSESSMENT   1. Acute frontal sinusitis, recurrence not specified   2. Nasal congestion   3. Nonintractable headache, unspecified chronicity pattern, unspecified headache type   4. [redacted] weeks gestation of pregnancy   5. Gestational hypertension, third trimester   6. NST (non-stress test) reactive on fetal surveillance     PLAN   Acute frontal sinusitis, recurrence not specified Rx for Augmentin  sent to patient's pharmacy of choice with side effects risks and benefits discussed with patient.  Please see AVS for further information Prescription for Flonase  Saline rinses Warm compresses  Nasal congestion Flonase   Nonintractable headache, unspecified chronicity pattern, unspecified headache type Excedrin  as needed  gHTN - IOL scheduled this evening  NST reactive  Future Appointments  Date Time Provider Department Center  09/11/2024 10:30 AM [facility] PROVIDER 1 [facility]  09/11/2024 10:45 AM [facility] NST [facility]  09/12/2024 12:00 AM [facility] SCHED ROOM [facility] None  10/19/2024  2:30 PM Erik Kieth BROCKS, MD [facility] None     Discharge home in stable condition with return precautions.   See AVS for full description of information given to the patient including both verbal and written. Patient verbalized understanding and agrees with the plan as described above.      Allergies as of 09/11/2024       Reactions   Doxycycline  Hives, Rash, Other (See Comments)   Blister patches on skin Bullous eruption, required  hospital treatment 01/2022   Methotrexate Other (See Comments)   Other Reaction(s): hairloss        Medication List     TAKE these medications    acetaminophen  500 MG tablet Commonly known as: TYLENOL  Take 1,000 mg by mouth every 6 (six) hours as needed.   albuterol  108 (90 Base) MCG/ACT inhaler Commonly known as: VENTOLIN  HFA Inhale 2 puffs into the lungs as needed for wheezing or shortness of breath.   amoxicillin -clavulanate 875-125 MG tablet Commonly known as: AUGMENTIN  Take 1 tablet by mouth every 12 (twelve) hours.   aspirin  EC 81 MG tablet Take 1 tablet (81 mg total) by mouth daily. Take after 12 weeks   azaTHIOprine  50 MG tablet Commonly known as: IMURAN  Take 50 mg by mouth daily.   B-12 1000 MCG Tabs Take 1 tablet (1,000 mcg total) by mouth daily.   Blood Pressure Kit Devi 1 Device by Does not apply route once a week.   budesonide -formoterol  80-4.5 MCG/ACT inhaler Commonly known as: Symbicort  Inhale 2 puffs into the lungs 2 (two) times daily. Until illness clears.   cholecalciferol 25 MCG (1000 UNIT) tablet Commonly known as: VITAMIN D3 Take 1,000 Units by mouth once a week.   clobetasol cream 0.05 % Commonly known as: TEMOVATE Apply 1 Application topically 2 (two) times daily.   Excedrin  Tension Headache 500-65 MG Tabs per tablet Generic drug: acetaminophen -caffeine  Take 2 tablets by mouth every 6 (six) hours as needed (Headache).   fluticasone  50 MCG/ACT nasal spray Commonly known as: FLONASE  Place 2 sprays into both nostrils daily.   hydroxychloroquine  200 MG tablet Commonly known as: PLAQUENIL  Take 400 mg by mouth daily.   pantoprazole  40 MG tablet Commonly known as: PROTONIX  TAKE 1 TABLET BY MOUTH EVERY DAY   Prenatal 28-0.8 MG Tabs Take 1 tablet by mouth daily.        Olam Dalton, MSN, [facility], [facility] [employer]      [1]  Social  History Tobacco Use   Smoking status: Never   Smokeless  tobacco: Never  Vaping Use   Vaping status: Never Used  Substance Use Topics   Alcohol use: Not Currently    Comment: occ   Drug use: Not Currently    Types: Marijuana    Comment: last use sep 2024  [2]  Allergies Allergen Reactions   Doxycycline  Hives, Rash and Other (See Comments)    Blister patches on skin Bullous eruption, required hospital treatment 01/2022   Methotrexate Other (See Comments)    Other Reaction(s): hairloss

## 2024-09-11 NOTE — MAU Note (Signed)
 Kelly [name] is a 29 y.o. at [redacted]w[redacted]d here in MAU reporting: L sided sinus pressure of the face that is radiating to her tooth, ear and head. No c/o of CTX, LOF, VB and reports +FM.   LMP:  Onset of complaint: yesterday  Pain score: 10/10  FHT:   Lab orders placed from triage:

## 2024-09-12 ENCOUNTER — Inpatient Hospital Stay (HOSPITAL_COMMUNITY)

## 2024-09-12 ENCOUNTER — Encounter (HOSPITAL_COMMUNITY): Payer: Self-pay | Admitting: Obstetrics and Gynecology

## 2024-09-12 ENCOUNTER — Inpatient Hospital Stay (HOSPITAL_COMMUNITY)
Admission: RE | Admit: 2024-09-12 | Discharge: 2024-09-15 | DRG: 787 | Disposition: A | Attending: Obstetrics and Gynecology | Admitting: Obstetrics and Gynecology

## 2024-09-12 ENCOUNTER — Inpatient Hospital Stay (HOSPITAL_COMMUNITY): Admitting: Anesthesiology

## 2024-09-12 ENCOUNTER — Other Ambulatory Visit: Payer: Self-pay

## 2024-09-12 DIAGNOSIS — Z87891 Personal history of nicotine dependence: Secondary | ICD-10-CM | POA: Diagnosis not present

## 2024-09-12 DIAGNOSIS — Z881 Allergy status to other antibiotic agents status: Secondary | ICD-10-CM | POA: Diagnosis not present

## 2024-09-12 DIAGNOSIS — M329 Systemic lupus erythematosus, unspecified: Secondary | ICD-10-CM | POA: Diagnosis present

## 2024-09-12 DIAGNOSIS — O99214 Obesity complicating childbirth: Secondary | ICD-10-CM | POA: Diagnosis present

## 2024-09-12 DIAGNOSIS — Z833 Family history of diabetes mellitus: Secondary | ICD-10-CM | POA: Diagnosis not present

## 2024-09-12 DIAGNOSIS — Z7982 Long term (current) use of aspirin: Secondary | ICD-10-CM

## 2024-09-12 DIAGNOSIS — Z8616 Personal history of COVID-19: Secondary | ICD-10-CM

## 2024-09-12 DIAGNOSIS — O99892 Other specified diseases and conditions complicating childbirth: Secondary | ICD-10-CM | POA: Diagnosis present

## 2024-09-12 DIAGNOSIS — O139 Gestational [pregnancy-induced] hypertension without significant proteinuria, unspecified trimester: Secondary | ICD-10-CM | POA: Diagnosis present

## 2024-09-12 DIAGNOSIS — O34211 Maternal care for low transverse scar from previous cesarean delivery: Secondary | ICD-10-CM | POA: Diagnosis present

## 2024-09-12 DIAGNOSIS — D62 Acute posthemorrhagic anemia: Secondary | ICD-10-CM | POA: Diagnosis not present

## 2024-09-12 DIAGNOSIS — Z98891 History of uterine scar from previous surgery: Secondary | ICD-10-CM

## 2024-09-12 DIAGNOSIS — O134 Gestational [pregnancy-induced] hypertension without significant proteinuria, complicating childbirth: Secondary | ICD-10-CM | POA: Diagnosis present

## 2024-09-12 DIAGNOSIS — Z79899 Other long term (current) drug therapy: Secondary | ICD-10-CM | POA: Diagnosis not present

## 2024-09-12 DIAGNOSIS — O133 Gestational [pregnancy-induced] hypertension without significant proteinuria, third trimester: Principal | ICD-10-CM

## 2024-09-12 DIAGNOSIS — O9081 Anemia of the puerperium: Secondary | ICD-10-CM | POA: Diagnosis not present

## 2024-09-12 DIAGNOSIS — K047 Periapical abscess without sinus: Secondary | ICD-10-CM | POA: Diagnosis present

## 2024-09-12 DIAGNOSIS — O99891 Other specified diseases and conditions complicating pregnancy: Secondary | ICD-10-CM | POA: Diagnosis present

## 2024-09-12 LAB — COMPREHENSIVE METABOLIC PANEL WITH GFR
ALT: 17 U/L (ref 0–44)
AST: 25 U/L (ref 15–41)
Albumin: 3.3 g/dL — ABNORMAL LOW (ref 3.5–5.0)
Alkaline Phosphatase: 63 U/L (ref 38–126)
Anion gap: 12 (ref 5–15)
BUN: 5 mg/dL — ABNORMAL LOW (ref 6–20)
CO2: 20 mmol/L — ABNORMAL LOW (ref 22–32)
Calcium: 8.5 mg/dL — ABNORMAL LOW (ref 8.9–10.3)
Chloride: 104 mmol/L (ref 98–111)
Creatinine, Ser: 0.52 mg/dL (ref 0.44–1.00)
GFR, Estimated: >60 mL/min
Glucose, Bld: 75 mg/dL (ref 70–99)
Potassium: 3.7 mmol/L (ref 3.5–5.1)
Sodium: 136 mmol/L (ref 135–145)
Total Bilirubin: 0.6 mg/dL (ref 0.0–1.2)
Total Protein: 6.8 g/dL (ref 6.5–8.1)

## 2024-09-12 LAB — GROUP B STREP BY PCR: Group B strep by PCR: NEGATIVE

## 2024-09-12 LAB — CBC
HCT: 31.8 % — ABNORMAL LOW (ref 36.0–46.0)
HCT: 31.5 % — ABNORMAL LOW (ref 36.0–46.0)
Hemoglobin: 11.3 g/dL — ABNORMAL LOW (ref 12.0–15.0)
Hemoglobin: 10.9 g/dL — ABNORMAL LOW (ref 12.0–15.0)
MCH: 33.1 pg (ref 26.0–34.0)
MCH: 32.6 pg (ref 26.0–34.0)
MCHC: 35.5 g/dL (ref 30.0–36.0)
MCHC: 34.6 g/dL (ref 30.0–36.0)
MCV: 93.3 fL (ref 80.0–100.0)
MCV: 94.3 fL (ref 80.0–100.0)
Platelets: 230 K/uL (ref 150–400)
Platelets: 210 K/uL (ref 150–400)
RBC: 3.41 MIL/uL — ABNORMAL LOW (ref 3.87–5.11)
RBC: 3.34 MIL/uL — ABNORMAL LOW (ref 3.87–5.11)
RDW: 14.5 % (ref 11.5–15.5)
RDW: 14.6 % (ref 11.5–15.5)
WBC: 4.3 K/uL (ref 4.0–10.5)
WBC: 5.9 K/uL (ref 4.0–10.5)
nRBC: 0 % (ref 0.0–0.2)
nRBC: 0 % (ref 0.0–0.2)

## 2024-09-12 LAB — TYPE AND SCREEN
ABO/RH(D): B POS
Antibody Screen: NEGATIVE

## 2024-09-12 LAB — SYPHILIS: RPR W/REFLEX TO RPR TITER AND TREPONEMAL ANTIBODIES, TRADITIONAL SCREENING AND DIAGNOSIS ALGORITHM: RPR Ser Ql: NONREACTIVE

## 2024-09-12 LAB — PROTEIN / CREATININE RATIO, URINE
Creatinine, Urine: 121 mg/dL
Protein Creatinine Ratio: 0.2 mg/mg — ABNORMAL HIGH
Total Protein, Urine: 19 mg/dL

## 2024-09-12 MED ORDER — AZATHIOPRINE 50 MG PO TABS: 50.0000 mg | ORAL_TABLET | Freq: Every day | ORAL | Status: DC

## 2024-09-12 MED ORDER — EPHEDRINE 5 MG/ML INJ: 10.0000 mg | INTRAVENOUS | Status: DC | PRN

## 2024-09-12 MED ORDER — LACTATED RINGERS IV SOLN
500.0000 mL | Freq: Once | INTRAVENOUS | Status: AC
  Administered 2024-09-12: 500 mL via INTRAVENOUS

## 2024-09-12 MED ORDER — DIPHENHYDRAMINE HCL 50 MG/ML IJ SOLN: 12.5000 mg | INTRAMUSCULAR | Status: DC | PRN

## 2024-09-12 MED ORDER — OXYTOCIN-SODIUM CHLORIDE 30-0.9 UT/500ML-% IV SOLN
1.0000 m[IU]/min | INTRAVENOUS | Status: DC
  Administered 2024-09-12: 2 m[IU]/min via INTRAVENOUS
  Filled 2024-09-12: qty 500

## 2024-09-12 MED ORDER — LACTATED RINGERS IV SOLN: INTRAVENOUS | Status: AC

## 2024-09-12 MED ORDER — TERBUTALINE SULFATE 1 MG/ML IJ SOLN
0.2500 mg | Freq: Once | INTRAMUSCULAR | Status: AC | PRN
  Administered 2024-09-13: 0.25 mg via SUBCUTANEOUS
  Filled 2024-09-12: qty 1

## 2024-09-12 MED ORDER — LACTATED RINGERS IV SOLN: 500.0000 mL | INTRAVENOUS | Status: AC | PRN

## 2024-09-12 MED ORDER — LIDOCAINE HCL (PF) 1 % IJ SOLN: 30.0000 mL | INTRAMUSCULAR | Status: DC | PRN

## 2024-09-12 MED ORDER — PHENYLEPHRINE 80 MCG/ML (10ML) SYRINGE FOR IV PUSH (FOR BLOOD PRESSURE SUPPORT): 80.0000 ug | PREFILLED_SYRINGE | INTRAVENOUS | Status: DC | PRN

## 2024-09-12 MED ORDER — ONDANSETRON HCL 4 MG/2ML IJ SOLN
4.0000 mg | Freq: Four times a day (QID) | INTRAMUSCULAR | Status: DC | PRN
  Administered 2024-09-12: 4 mg via INTRAVENOUS
  Filled 2024-09-12: qty 2

## 2024-09-12 MED ORDER — LACTATED RINGERS IV SOLN: 500.0000 mL | Freq: Once | INTRAVENOUS | Status: DC

## 2024-09-12 MED ORDER — AMOXICILLIN-POT CLAVULANATE 875-125 MG PO TABS
1.0000 | ORAL_TABLET | Freq: Two times a day (BID) | ORAL | Status: DC
  Administered 2024-09-12 – 2024-09-13 (×2): 1 via ORAL
  Filled 2024-09-12 (×3): qty 1

## 2024-09-12 MED ORDER — LIDOCAINE HCL (PF) 1 % IJ SOLN
INTRAMUSCULAR | Status: DC | PRN
  Administered 2024-09-12: 8 mL via EPIDURAL

## 2024-09-12 MED ORDER — OXYTOCIN BOLUS FROM INFUSION: 333.0000 mL | Freq: Once | INTRAVENOUS | Status: DC

## 2024-09-12 MED ORDER — FENTANYL-BUPIVACAINE-NACL 0.5-0.125-0.9 MG/250ML-% EP SOLN: 12.0000 mL/h | EPIDURAL | Status: DC | PRN

## 2024-09-12 MED ORDER — OXYCODONE-ACETAMINOPHEN 5-325 MG PO TABS: 2.0000 | ORAL_TABLET | ORAL | Status: DC | PRN

## 2024-09-12 MED ORDER — AZATHIOPRINE 50 MG PO TABS
100.0000 mg | ORAL_TABLET | Freq: Every day | ORAL | Status: DC
  Filled 2024-09-12 (×2): qty 2

## 2024-09-12 MED ORDER — ACETAMINOPHEN 325 MG PO TABS: 650.0000 mg | ORAL_TABLET | ORAL | Status: DC | PRN

## 2024-09-12 MED ORDER — FENTANYL-BUPIVACAINE-NACL 0.5-0.125-0.9 MG/250ML-% EP SOLN
12.0000 mL/h | EPIDURAL | Status: DC | PRN
  Administered 2024-09-12: 12 mL/h via EPIDURAL
  Filled 2024-09-12: qty 250

## 2024-09-12 MED ORDER — OXYCODONE-ACETAMINOPHEN 5-325 MG PO TABS: 1.0000 | ORAL_TABLET | ORAL | Status: DC | PRN

## 2024-09-12 MED ORDER — SOD CITRATE-CITRIC ACID 500-334 MG/5ML PO SOLN
30.0000 mL | ORAL | Status: DC | PRN
  Administered 2024-09-13: 30 mL via ORAL
  Filled 2024-09-12: qty 30

## 2024-09-12 MED ORDER — OXYTOCIN-SODIUM CHLORIDE 30-0.9 UT/500ML-% IV SOLN: 2.5000 [IU]/h | INTRAVENOUS | Status: DC

## 2024-09-12 MED ORDER — FENTANYL CITRATE (PF) 100 MCG/2ML IJ SOLN
50.0000 ug | INTRAMUSCULAR | Status: DC | PRN
  Administered 2024-09-12 (×2): 100 ug via INTRAVENOUS
  Filled 2024-09-12 (×2): qty 2

## 2024-09-12 MED ORDER — HYDROXYCHLOROQUINE SULFATE 200 MG PO TABS
400.0000 mg | ORAL_TABLET | Freq: Every day | ORAL | Status: DC
  Filled 2024-09-12 (×2): qty 2

## 2024-09-12 NOTE — H&P (Signed)
 OBSTETRIC ADMISSION HISTORY AND PHYSICAL  Kelly [name] is a 29 y.o. female G2P0101 with IUP at 107w1d (dated by LMP, Estimated Date of Delivery: 10/02/24) presenting for IOL for gHTN.   She reports +FMs, No LOF, no VB, no blurry vision, headaches or peripheral edema, and RUQ pain.    She plans on breast feeding. She declines birth control.  She received her prenatal care at [facility]   Prenatal History/Complications:   gHTN SLE TOLAC  Past Medical History: Past Medical History:  Diagnosis Date   Anxiety    Cellulitis and abscess of leg 04/24/2022   Chest discomfort 09/2015   Pt states due to shortness of breath   Cough present for greater than 3 weeks    COVID-19 virus infection 04/26/2022   Gonorrhea affecting pregnancy in third trimester 03/15/2019   Treated 03/13/19     Hydronephrosis of right kidney 02/24/2019   Mild on 02/24/2019     Late Entry to [employer]- April 2020- Social Distancing  11/27/2018   Pt signed up for [employer]. Will come to office to pick up BP cuff.    Lupus (systemic lupus erythematosus) (HCC)    dx 2010   Pneumonia    2015   Pregnancy induced hypertension    Shingles 06/24/2022   Shortness of breath dyspnea 09/2015   changed inhaler with relief    Past Surgical History: Past Surgical History:  Procedure Laterality Date   BRONCHOSCOPY  11/2013   CESAREAN SECTION N/A 03/16/2019   Procedure: CESAREAN SECTION;  Surgeon: Barbra Lang PARAS, DO;  Location: [facility] LD ORS;  Service: Obstetrics;  Laterality: N/A;   WISDOM TOOTH EXTRACTION  2014    Obstetrical History: OB History     Gravida  2   Para  1   Term      Preterm  1   AB      Living  1      SAB      IAB      Ectopic      Multiple  0   Live Births  1           Social History Social History   Socioeconomic History   Marital status: Married    Spouse name: Not on file   Number of children: Not on file   Years of education: Not on file   Highest education  level: Not on file  Occupational History   Not on file  Tobacco Use   Smoking status: Never   Smokeless tobacco: Never  Vaping Use   Vaping status: Never Used  Substance and Sexual Activity   Alcohol use: Not Currently    Comment: occ   Drug use: Not Currently    Types: Marijuana    Comment: last use sep 2024   Sexual activity: Yes    Partners: Male    Birth control/protection: None  Other Topics Concern   Not on file  Social History Narrative   Not on file   Social Drivers of Health   Tobacco Use: Low Risk (09/12/2024)   Patient History    Smoking Tobacco Use: Never    Smokeless Tobacco Use: Never    Passive Exposure: Not on file  Recent Concern: Tobacco Use - Medium Risk (07/09/2024)   Received from [facility]   Patient History    Smoking Tobacco Use: Former    Smokeless Tobacco Use: Never    Passive Exposure: Not on file  Financial Resource Strain: Medium  Risk (03/13/2023)   Received from [facility]   Overall Financial Resource Strain (CARDIA)    Difficulty of Paying Living Expenses: Somewhat hard  Food Insecurity: No Food Insecurity (09/12/2024)   Epic    Worried About Programme Researcher, Broadcasting/film/video in the Last Year: Never true    Ran Out of Food in the Last Year: Never true  Transportation Needs: No Transportation Needs (09/12/2024)   Epic    Lack of Transportation (Medical): No    Lack of Transportation (Non-Medical): No  Physical Activity: Unknown (03/13/2023)   Received from [facility]   Exercise Vital Sign    On average, how many days per week do you engage in moderate to strenuous exercise (like a brisk walk)?: 0 days    Minutes of Exercise per Session: Not on file  Stress: No Stress Concern Present (03/13/2023)   Received from [facility] - Occupational Stress Questionnaire    Feeling of Stress : Not at all  Social Connections: Socially Integrated (03/13/2023)   Received from [facility]    Social Network    How would you rate your social network (family, work, friends)?: Good participation with social networks  Depression (PHQ2-9): Low Risk (02/27/2024)   Depression (PHQ2-9)    PHQ-2 Score: 1  Alcohol Screen: Not on file  Housing: Low Risk (09/12/2024)   Epic    Unable to Pay for Housing in the Last Year: No    Number of Times Moved in the Last Year: 1    Homeless in the Last Year: No  Utilities: Not At Risk (09/12/2024)   Epic    Threatened with loss of utilities: No  Health Literacy: Not on file    Family History: Family History  Problem Relation Age of Onset   Healthy Mother    Healthy Father    Thyroid  disease Maternal Grandmother    Lupus Maternal Grandmother    Diabetes Paternal Grandmother    Asthma Neg Hx    Cancer Neg Hx    Hypertension Neg Hx    Heart disease Neg Hx     Allergies: Allergies[1]  Medications Prior to Admission  Medication Sig Dispense Refill Last Dose/Taking   acetaminophen  (TYLENOL ) 500 MG tablet Take 1,000 mg by mouth every 6 (six) hours as needed.      acetaminophen -caffeine  (EXCEDRIN  TENSION HEADACHE) 500-65 MG TABS per tablet Take 2 tablets by mouth every 6 (six) hours as needed (Headache). 60 tablet 0    albuterol  (VENTOLIN  HFA) 108 (90 Base) MCG/ACT inhaler Inhale 2 puffs into the lungs as needed for wheezing or shortness of breath.      amoxicillin -clavulanate (AUGMENTIN ) 875-125 MG tablet Take 1 tablet by mouth every 12 (twelve) hours. 14 tablet 0    aspirin  EC 81 MG tablet Take 1 tablet (81 mg total) by mouth daily. Take after 12 weeks 300 tablet 2    azaTHIOprine  (IMURAN ) 50 MG tablet Take 50 mg by mouth daily.      Blood Pressure Monitoring (BLOOD PRESSURE KIT) DEVI 1 Device by Does not apply route once a week. (Patient not taking: Reported on 09/10/2024) 1 each 0    budesonide -formoterol  (SYMBICORT ) 80-4.5 MCG/ACT inhaler Inhale 2 puffs into the lungs 2 (two) times daily. Until illness clears. 1 each 12    cholecalciferol  (VITAMIN D3) 25 MCG (1000 UNIT) tablet Take 1,000 Units by mouth once a week.      clobetasol cream (TEMOVATE) 0.05 % Apply 1  Application topically 2 (two) times daily.      Cyanocobalamin  (B-12) 1000 MCG TABS Take 1 tablet (1,000 mcg total) by mouth daily. 90 tablet 2    fluticasone  (FLONASE ) 50 MCG/ACT nasal spray Place 2 sprays into both nostrils daily. 1 g 0    hydroxychloroquine  (PLAQUENIL ) 200 MG tablet Take 400 mg by mouth daily.      pantoprazole  (PROTONIX ) 40 MG tablet TAKE 1 TABLET BY MOUTH EVERY DAY 90 tablet 1    Prenatal 28-0.8 MG TABS Take 1 tablet by mouth daily. 30 tablet 12      Review of Systems  All systems reviewed and negative except as stated in HPI.  Blood pressure 135/87, pulse 99, temperature 98.8 F (37.1 C), temperature source Oral, resp. rate 17, height 5' 2 (1.575 m), weight 81.7 kg, last menstrual period 12/27/2023, SpO2 100%. General appearance: alert, cooperative, and appears stated age Lungs: breathing comfortably on room air Heart: regular rate Abdomen: soft, non-tender; gravid Extremities: no edema of bilateral lower extremities DTR's intact Presentation: cephalic Fetal monitoringBaseline: 140 bpm, Variability: Good {> 6 bpm), Accelerations: Reactive, and Decelerations: Absent Uterine activityNone     Prenatal labs: ABO, Rh: B/Positive/-- (08/04 1610) Antibody: Negative (08/04 1610) Rubella: 16.80 (08/04 1610) RPR: Non Reactive (11/12 0826)  HBsAg: Negative (08/04 1610)  HIV: Non Reactive (11/12 0826)  GBS:    2 hr Glucola normal Genetic screening  LR female Anatomy US  Appears normal Last US : At [redacted]w[redacted]d - cephalic presentation, EFW 2155g (40 %tile), AC 22%  Prenatal Transfer Tool  Maternal Diabetes: No Genetic Screening: Normal Maternal Ultrasounds/Referrals: Normal Fetal Ultrasounds or other Referrals:  Fetal echo Maternal Substance Abuse:  No Significant Maternal Medications:  Meds include: Other: Plaquenil , azathioprine   Significant  Maternal Lab Results:  None Number of Prenatal Visits:greater than 3 verified prenatal visits Other Comments:  None  No results found for this or any previous visit (from the past 24 hours).  Patient Active Problem List   Diagnosis Date Noted   Indication for care or intervention related to labor and delivery 09/12/2024   Gestational hypertension 08/21/2024   History of preterm premature rupture of membranes (PPROM) 05/01/2024   History of preterm delivery, currently pregnant 05/01/2024   Supervision of high risk pregnancy, antepartum 02/27/2024   History of postpartum pre-eclampsia 03/31/2019   History of cesarean section 03/16/2019   B12 deficiency anemia 10/14/2018   Systemic lupus erythematosus affecting pregnancy (HCC) 06/04/2012    Assessment/Plan:  YEN WANDELL is a 29 y.o. G2P0101 at [redacted]w[redacted]d here for IOL for gHTN.   #Labor: IOL process explained to the patient. Will start with pitocin .  #Pain: Per patient request #FWB: Cat I  #ID:  GBS unknown > PCR pending #MOF: Breast #MOC: None #Circ:  N/A #SLE: Follows with [facility]. On imuran , plaquenil  and prednisone . Prophylactic Lovenox  recommended 10 days PP if patient has rLTCS.  #TOLAC: G1 NRFHT. Consent signed 11/12.   Kelly Angles, MD OB Fellow, Faculty [facility], Center for [facility] 09/12/2024 5:40 AM         [1]  Allergies Allergen Reactions   Doxycycline  Hives, Rash and Other (See Comments)    Blister patches on skin Bullous eruption, required hospital treatment 01/2022   Methotrexate Other (See Comments)    Other Reaction(s): hairloss

## 2024-09-12 NOTE — Anesthesia Preprocedure Evaluation (Signed)
"                                    Anesthesia Evaluation  Patient identified by MRN, date of birth, ID band Patient awake    Reviewed: Allergy & Precautions, NPO status , reviewed documented beta blocker date and time   Airway Mallampati: I  TM Distance: >3 FB Neck ROM: Full    Dental no notable dental hx.    Pulmonary neg pulmonary ROS   Pulmonary exam normal breath sounds clear to auscultation       Cardiovascular hypertension, Normal cardiovascular exam Rhythm:Regular Rate:Normal     Neuro/Psych    GI/Hepatic negative GI ROS,,,  Endo/Other    Class 4 obesity  Renal/GU negative Renal ROS     Musculoskeletal Systemic lupus erythematosus affecting pregnancy    Abdominal   Peds  Hematology   Anesthesia Other Findings Systemic lupus erythematosus affecting pregnancy   Reproductive/Obstetrics                              Anesthesia Physical Anesthesia Plan  ASA: 3  Anesthesia Plan: Epidural   Post-op Pain Management: Minimal or no pain anticipated   Induction: Intravenous  PONV Risk Score and Plan: 2  Airway Management Planned: Natural Airway  Additional Equipment: Fetal Monitoring  Intra-op Plan:   Post-operative Plan:   Informed Consent: I have reviewed the patients History and Physical, chart, labs and discussed the procedure including the risks, benefits and alternatives for the proposed anesthesia with the patient or authorized representative who has indicated his/her understanding and acceptance.     Dental Advisory Given  Plan Discussed with: Anesthesiologist  Anesthesia Plan Comments: (Labs checked- platelets confirmed with RN in room. Fetal heart tracing, per RN, reported to be stable enough for sitting procedure. Discussed epidural, and patient consents to the procedure:  included risk of possible headache,backache, failed block, allergic reaction, and nerve injury. This patient was asked if she had  any questions or concerns before the procedure started.)         Anesthesia Quick Evaluation  "

## 2024-09-12 NOTE — Progress Notes (Signed)
 Patient ID: Kelly [name], female   DOB: December 05, 1995, 29 y.o.   MRN: [id number]  Blood pressure 117/77, pulse 97, temperature 97.8 F (36.6 C), temperature source Oral, resp. rate 14, height 5' 2 (1.575 m), weight 81.7 kg, last menstrual period 12/27/2023, SpO2 100%.  FHT: baseline 140/moderate variability/+accels/occasional variable decelerations Toco: ctx q1-8min  Dilation: 1 Effacement (%): Thick Cervical Position: Posterior Station: Ballotable Presentation: Vertex (via BSUS) Exam by:: JINNY Mt, RN  Seen at bedside to meet patient and family. Currently on pitocin  of 4 for augmentation. Previously discussed foley balloon with night team but declined. Will plan for SVE around noon to determine plan. All questions answered at bedside.  Kelly DELENA Courts, MD

## 2024-09-12 NOTE — Progress Notes (Signed)
 Patient ID: Kelly [name], female   DOB: [birth year], 29 y.o.   MRN: [id number]  Blood pressure (!) 144/68, pulse 77, temperature 97.8 F (36.6 C), temperature source Oral, resp. rate 16, height 5' 2 (1.575 m), weight 81.7 kg, last menstrual period 12/27/2023, SpO2 100%.  FHT: baseline 130/moderate variability/+accels/-decels Toco: ctx q69min with irritability  Dilation: 1.5 Effacement (%): Thick Cervical Position: Posterior Station: -3 Presentation: Vertex Exam by:: Jomarie  Seen at bedside with Delon Must RN to discuss Cooks placement. Patient amenable with pre-procedure fentanyl . Cooks placed without difficulty, filled with 60mL LR. Pitocin  currently at 8. All questions answered.  Charlie DELENA Jomarie, MD

## 2024-09-12 NOTE — Progress Notes (Signed)
 Patient ID: Kelly [name], female   DOB: 1995-12-25, 29 y.o.   MRN: [id number]  Blood pressure (!) 136/92, pulse 75, temperature 98.4 F (36.9 C), temperature source Oral, resp. rate 18, height 5' 2 (1.575 m), weight 81.7 kg, last menstrual period 12/27/2023, SpO2 100%.  FHT: baseline 130/moderate variability/+accels/-decels Toco: ctx q45min  Dilation: 4.5 Effacement (%): 60 Cervical Position: Posterior Station: -2 Presentation: Vertex Exam by:: Jomarie MD  Seen at bedside for evaluation. SVE showing progression with tight bag of water . Would like epidural prior to AROM. All questions answered.  Charlie DELENA Jomarie, MD

## 2024-09-12 NOTE — Anesthesia Procedure Notes (Signed)
 Epidural Patient location during procedure: OB Start time: 09/12/2024 9:05 PM End time: 09/12/2024 9:11 PM  Staffing Anesthesiologist: Mallory Manus, MD  Preanesthetic Checklist Completed: patient identified, IV checked, site marked, risks and benefits discussed, surgical consent, monitors and equipment checked, pre-op evaluation and timeout performed  Epidural Patient position: sitting Prep: DuraPrep and site prepped and draped Patient monitoring: continuous pulse ox and blood pressure Approach: midline Location: L4-L5 Injection technique: LOR air  Needle:  Needle type: Tuohy  Needle gauge: 17 G Needle length: 9 cm and 9 Needle insertion depth: 7 cm Catheter type: closed end flexible Catheter size: 19 Gauge Catheter at skin depth: 13 cm Test dose: negative  Assessment Events: blood not aspirated, no cerebrospinal fluid, injection not painful, no injection resistance, no paresthesia and negative IV test

## 2024-09-13 ENCOUNTER — Encounter (HOSPITAL_COMMUNITY): Admission: RE | Disposition: A | Payer: Self-pay | Source: Home / Self Care | Attending: Obstetrics and Gynecology

## 2024-09-13 ENCOUNTER — Encounter (HOSPITAL_COMMUNITY): Payer: Self-pay | Admitting: Obstetrics and Gynecology

## 2024-09-13 MED ORDER — CEFAZOLIN SODIUM-DEXTROSE 2-4 GM/100ML-% IV SOLN
INTRAVENOUS | Status: AC
  Filled 2024-09-13: qty 100

## 2024-09-13 MED ORDER — SENNOSIDES-DOCUSATE SODIUM 8.6-50 MG PO TABS
2.0000 | ORAL_TABLET | Freq: Every day | ORAL | Status: DC
  Administered 2024-09-15: 2 via ORAL
  Filled 2024-09-13 (×2): qty 2

## 2024-09-13 MED ORDER — SCOPOLAMINE 1 MG/3DAYS TD PT72
1.0000 | MEDICATED_PATCH | Freq: Once | TRANSDERMAL | Status: DC
  Administered 2024-09-13: 1 mg via TRANSDERMAL
  Filled 2024-09-13: qty 1

## 2024-09-13 MED ORDER — POTASSIUM CHLORIDE CRYS ER 20 MEQ PO TBCR
20.0000 meq | EXTENDED_RELEASE_TABLET | Freq: Every day | ORAL | Status: DC
  Administered 2024-09-13 – 2024-09-15 (×3): 20 meq via ORAL
  Filled 2024-09-13 (×3): qty 1

## 2024-09-13 MED ORDER — AZITHROMYCIN 500 MG PO TABS
1000.0000 mg | ORAL_TABLET | Freq: Once | ORAL | Status: AC
  Administered 2024-09-13: 1000 mg via ORAL
  Filled 2024-09-13: qty 2

## 2024-09-13 MED ORDER — ACETAMINOPHEN 10 MG/ML IV SOLN
INTRAVENOUS | Status: DC | PRN
  Administered 2024-09-13: 1000 mg via INTRAVENOUS

## 2024-09-13 MED ORDER — FENTANYL CITRATE (PF) 100 MCG/2ML IJ SOLN
INTRAMUSCULAR | Status: AC
  Filled 2024-09-13: qty 2

## 2024-09-13 MED ORDER — COCONUT OIL OIL
1.0000 | TOPICAL_OIL | Status: DC | PRN
  Administered 2024-09-13: 1 via TOPICAL

## 2024-09-13 MED ORDER — SODIUM CHLORIDE 0.9 % IR SOLN
Status: DC | PRN
  Administered 2024-09-13: 1

## 2024-09-13 MED ORDER — SIMETHICONE 80 MG PO CHEW
80.0000 mg | CHEWABLE_TABLET | ORAL | Status: DC | PRN
  Administered 2024-09-13: 80 mg via ORAL

## 2024-09-13 MED ORDER — SODIUM CHLORIDE 0.9% FLUSH: 3.0000 mL | INTRAVENOUS | Status: DC | PRN

## 2024-09-13 MED ORDER — MORPHINE SULFATE (PF) 0.5 MG/ML IJ SOLN
INTRAMUSCULAR | Status: DC | PRN
  Administered 2024-09-13: 3 mg via EPIDURAL

## 2024-09-13 MED ORDER — AMOXICILLIN-POT CLAVULANATE 875-125 MG PO TABS
1.0000 | ORAL_TABLET | Freq: Two times a day (BID) | ORAL | Status: DC
  Administered 2024-09-14 – 2024-09-15 (×3): 1 via ORAL
  Filled 2024-09-13 (×5): qty 1

## 2024-09-13 MED ORDER — OXYTOCIN-SODIUM CHLORIDE 30-0.9 UT/500ML-% IV SOLN: 2.5000 [IU]/h | INTRAVENOUS | Status: AC

## 2024-09-13 MED ORDER — LIDOCAINE-EPINEPHRINE (PF) 2 %-1:200000 IJ SOLN
INTRAMUSCULAR | Status: DC | PRN
  Administered 2024-09-13 (×2): 5 mL via EPIDURAL

## 2024-09-13 MED ORDER — DIPHENHYDRAMINE HCL 25 MG PO CAPS
25.0000 mg | ORAL_CAPSULE | ORAL | Status: DC | PRN
  Administered 2024-09-13: 25 mg via ORAL
  Filled 2024-09-13: qty 1

## 2024-09-13 MED ORDER — PRENATAL MULTIVITAMIN CH
1.0000 | ORAL_TABLET | Freq: Every day | ORAL | Status: DC
  Administered 2024-09-14 – 2024-09-15 (×2): 1 via ORAL
  Filled 2024-09-13 (×2): qty 1

## 2024-09-13 MED ORDER — DIPHENHYDRAMINE HCL 25 MG PO CAPS: 25.0000 mg | ORAL_CAPSULE | Freq: Four times a day (QID) | ORAL | Status: DC | PRN

## 2024-09-13 MED ORDER — WITCH HAZEL-GLYCERIN EX PADS: 1.0000 | MEDICATED_PAD | CUTANEOUS | Status: DC | PRN

## 2024-09-13 MED ORDER — SIMETHICONE 80 MG PO CHEW
80.0000 mg | CHEWABLE_TABLET | Freq: Three times a day (TID) | ORAL | Status: DC
  Administered 2024-09-14 – 2024-09-15 (×4): 80 mg via ORAL
  Filled 2024-09-13 (×5): qty 1

## 2024-09-13 MED ORDER — ONDANSETRON HCL 4 MG/2ML IJ SOLN
INTRAMUSCULAR | Status: DC | PRN
  Administered 2024-09-13: 4 mg via INTRAVENOUS

## 2024-09-13 MED ORDER — LACTATED RINGERS IV SOLN
500.0000 mL | INTRAVENOUS | Status: DC | PRN
  Administered 2024-09-13: 500 mL via INTRAVENOUS

## 2024-09-13 MED ORDER — FENTANYL CITRATE (PF) 100 MCG/2ML IJ SOLN
INTRAMUSCULAR | Status: DC | PRN
  Administered 2024-09-13: 100 ug via EPIDURAL

## 2024-09-13 MED ORDER — CALCIUM CARBONATE ANTACID 500 MG PO CHEW
2.0000 | CHEWABLE_TABLET | Freq: Once | ORAL | Status: AC
  Administered 2024-09-13: 400 mg via ORAL
  Filled 2024-09-13: qty 2

## 2024-09-13 MED ORDER — LIDOCAINE-EPINEPHRINE (PF) 2 %-1:200000 IJ SOLN
INTRAMUSCULAR | Status: AC
  Filled 2024-09-13: qty 20

## 2024-09-13 MED ORDER — PREDNISOLONE 5 MG PO TABS
2.5000 mg | ORAL_TABLET | Freq: Every day | ORAL | Status: DC
  Administered 2024-09-13 – 2024-09-15 (×3): 2.5 mg via ORAL
  Filled 2024-09-13 (×3): qty 1

## 2024-09-13 MED ORDER — IBUPROFEN 600 MG PO TABS
600.0000 mg | ORAL_TABLET | Freq: Four times a day (QID) | ORAL | Status: DC
  Administered 2024-09-13 – 2024-09-15 (×8): 600 mg via ORAL
  Filled 2024-09-13 (×8): qty 1

## 2024-09-13 MED ORDER — GABAPENTIN 300 MG PO CAPS
300.0000 mg | ORAL_CAPSULE | Freq: Two times a day (BID) | ORAL | Status: DC
  Administered 2024-09-13 – 2024-09-15 (×5): 300 mg via ORAL
  Filled 2024-09-13 (×5): qty 1

## 2024-09-13 MED ORDER — FAMOTIDINE IN NACL 20-0.9 MG/50ML-% IV SOLN
20.0000 mg | Freq: Once | INTRAVENOUS | Status: AC
  Administered 2024-09-13: 20 mg via INTRAVENOUS
  Filled 2024-09-13: qty 50

## 2024-09-13 MED ORDER — ONDANSETRON HCL 4 MG/2ML IJ SOLN: 4.0000 mg | Freq: Three times a day (TID) | INTRAMUSCULAR | Status: DC | PRN

## 2024-09-13 MED ORDER — ENOXAPARIN SODIUM 40 MG/0.4ML IJ SOSY
40.0000 mg | PREFILLED_SYRINGE | INTRAMUSCULAR | Status: DC
  Administered 2024-09-14 – 2024-09-15 (×2): 40 mg via SUBCUTANEOUS
  Filled 2024-09-13 (×2): qty 0.4

## 2024-09-13 MED ORDER — AZATHIOPRINE 50 MG PO TABS
50.0000 mg | ORAL_TABLET | Freq: Every day | ORAL | Status: DC
  Administered 2024-09-13 – 2024-09-15 (×3): 50 mg via ORAL
  Filled 2024-09-13 (×3): qty 1

## 2024-09-13 MED ORDER — FUROSEMIDE 20 MG PO TABS
20.0000 mg | ORAL_TABLET | Freq: Every day | ORAL | Status: DC
  Administered 2024-09-13 – 2024-09-15 (×3): 20 mg via ORAL
  Filled 2024-09-13 (×3): qty 1

## 2024-09-13 MED ORDER — STERILE WATER FOR IRRIGATION IR SOLN
Status: DC | PRN
  Administered 2024-09-13: 1000 mL

## 2024-09-13 MED ORDER — TERBUTALINE SULFATE 1 MG/ML IJ SOLN
0.2500 mg | Freq: Once | INTRAMUSCULAR | Status: AC
  Administered 2024-09-13: 0.25 mg via SUBCUTANEOUS

## 2024-09-13 MED ORDER — ACETAMINOPHEN 10 MG/ML IV SOLN
INTRAVENOUS | Status: AC
  Filled 2024-09-13: qty 100

## 2024-09-13 MED ORDER — NALOXONE HCL 4 MG/10ML IJ SOLN: 1.0000 ug/kg/h | INTRAVENOUS | Status: DC | PRN

## 2024-09-13 MED ORDER — CEFAZOLIN SODIUM-DEXTROSE 2-3 GM-%(50ML) IV SOLR
INTRAVENOUS | Status: DC | PRN
  Administered 2024-09-13: 2 g via INTRAVENOUS

## 2024-09-13 MED ORDER — OXYTOCIN-SODIUM CHLORIDE 30-0.9 UT/500ML-% IV SOLN
INTRAVENOUS | Status: DC | PRN
  Administered 2024-09-13: 300 mL via INTRAVENOUS

## 2024-09-13 MED ORDER — HYDROXYCHLOROQUINE SULFATE 200 MG PO TABS
400.0000 mg | ORAL_TABLET | Freq: Every day | ORAL | Status: DC
  Administered 2024-09-13 – 2024-09-15 (×3): 400 mg via ORAL
  Filled 2024-09-13 (×3): qty 2

## 2024-09-13 MED ORDER — DIBUCAINE (PERIANAL) 1 % EX OINT: 1.0000 | TOPICAL_OINTMENT | CUTANEOUS | Status: DC | PRN

## 2024-09-13 MED ORDER — DEXAMETHASONE SOD PHOSPHATE PF 10 MG/ML IJ SOLN
INTRAMUSCULAR | Status: DC | PRN
  Administered 2024-09-13: 10 mg via INTRAVENOUS

## 2024-09-13 MED ORDER — TRANEXAMIC ACID-NACL 1000-0.7 MG/100ML-% IV SOLN
INTRAVENOUS | Status: DC | PRN
  Administered 2024-09-13: 1000 mg via INTRAVENOUS

## 2024-09-13 MED ORDER — LACTATED RINGERS IV SOLN: INTRAVENOUS | Status: DC

## 2024-09-13 MED ORDER — OXYCODONE HCL 5 MG PO TABS
5.0000 mg | ORAL_TABLET | ORAL | Status: DC | PRN
  Administered 2024-09-14: 5 mg via ORAL
  Filled 2024-09-13: qty 1

## 2024-09-13 MED ORDER — NALOXONE HCL 0.4 MG/ML IJ SOLN: 0.4000 mg | INTRAMUSCULAR | Status: DC | PRN

## 2024-09-13 MED ORDER — MENTHOL 3 MG MT LOZG: 1.0000 | LOZENGE | OROMUCOSAL | Status: DC | PRN

## 2024-09-13 MED ORDER — ZOLPIDEM TARTRATE 5 MG PO TABS: 5.0000 mg | ORAL_TABLET | Freq: Every evening | ORAL | Status: DC | PRN

## 2024-09-13 MED ORDER — NIFEDIPINE ER OSMOTIC RELEASE 30 MG PO TB24
30.0000 mg | ORAL_TABLET | Freq: Every day | ORAL | Status: DC
  Administered 2024-09-13 – 2024-09-14 (×2): 30 mg via ORAL
  Filled 2024-09-13 (×2): qty 1

## 2024-09-13 MED ORDER — MORPHINE SULFATE (PF) 0.5 MG/ML IJ SOLN
INTRAMUSCULAR | Status: AC
  Filled 2024-09-13: qty 10

## 2024-09-13 MED ORDER — BUPIVACAINE HCL (PF) 0.5 % IJ SOLN
INTRAMUSCULAR | Status: AC
  Filled 2024-09-13: qty 30

## 2024-09-13 MED ORDER — TRANEXAMIC ACID-NACL 1000-0.7 MG/100ML-% IV SOLN
INTRAVENOUS | Status: AC
  Filled 2024-09-13: qty 100

## 2024-09-13 MED ORDER — DIPHENHYDRAMINE HCL 50 MG/ML IJ SOLN: 12.5000 mg | INTRAMUSCULAR | Status: DC | PRN

## 2024-09-13 NOTE — Progress Notes (Signed)
 Labor Progress Note TRANICE LADUKE is a 29 y.o. G2P0101 at [redacted]w[redacted]d    BP 139/85   Pulse 77   Temp 98.1 F (36.7 C) (Oral)   Resp 18   Ht 5' 2 (1.575 m)   Wt 81.7 kg   LMP 12/27/2023 (Exact Date)   SpO2 99%   BMI 32.96 kg/m   EFM: 140/Moderate Variability/Accelerations (+),Decelerations (-)  CVE: Dilation: 5 Effacement (%): 50 Cervical Position: Posterior Station: -3 Presentation: Vertex Exam by:: Briley Sulton MD  Progressing well. Cervical exam as above. AROM performed, clear fluid. IUPC also placed in setting of difficulty tracing on external toco as well as TOLAC. Epidural in place, working well.   Daneya Hartgrove LITTIE Angles, MD 12:42 AM

## 2024-09-13 NOTE — Progress Notes (Signed)
 Kelly [name] is a 29 y.o. G2P0101 at [redacted]w[redacted]d by LMP admitted for induction of labor due to [facility], patient is TOLAC.  Subjective: She request repeat cesarean section  Objective: BP (!) 142/90   Pulse (!) 102   Temp 99.4 F (37.4 C) (Axillary)   Resp 14   Ht 5' 2 (1.575 m)   Wt 81.7 kg   LMP 12/27/2023 (Exact Date)   SpO2 100%   BMI 32.96 kg/m  I/O last 3 completed shifts: In: -  Out: 900 [Urine:900] Total I/O In: -  Out: 1500 [Urine:1500]  FHT:  FHR: 160 bpm, variability: moderate,  accelerations:  Present,  decelerations:  Present prolonged and variables UC:   regular, every 7 minutes SVE:   Dilation: 6 Effacement (%): 70 Station: -2 Exam by:: Cashion  Labs: Lab Results  Component Value Date   WBC 5.9 09/12/2024   HGB 10.9 (L) 09/12/2024   HCT 31.5 (L) 09/12/2024   MCV 94.3 09/12/2024   PLT 210 09/12/2024    Assessment / Plan: Protracted active phase  Labor: Failure to progress Preeclampsia:  no signs or symptoms of toxicity Fetal Wellbeing:  Category II Pain Control:  Epidural I/D:  amoxicillin  Anticipated MOD:  RCS,  The risks of surgery were discussed in detail with the patient including but not limited to: bleeding which may require transfusion or reoperation; infection which may require prolonged hospitalization or re-hospitalization and antibiotic therapy; injury to bowel, bladder, ureters and major vessels or other surrounding organs which may lead to other procedures; formation of adhesions; need for additional procedures including laparotomy or subsequent procedures secondary to intraoperative injury or abnormal pathology; thromboembolic phenomenon; incisional problems and other postoperative or anesthesia complications.  Patient was told that the likelihood that her condition and symptoms will be treated effectively with this surgical management was very high; the postoperative expectations were also discussed in detail. The patient also understands the  alternative treatment options which were discussed in full. All questions were answered.  Lynwood Solomons, MD 09/13/2024, 10:55 AM

## 2024-09-13 NOTE — Lactation Note (Signed)
 This note was copied from a baby's chart. Lactation Consultation Note  Patient Name: Kelly [name] Unijb'd Date: 09/13/2024 Age:30 hours Reason for consult: Initial assessment;Early term 37-38.6wks;Infant < 6lbs;Breastfeeding assistance  P2- Infant was born at [redacted] weeks GA weighing 2640g. [name] plans on offering both breast milk and formula. [name] informed LC that infant has not been able to latch since birth, but she also isn't very interested in bottle feeding. LC reviewed the first 24 hr birthday nap and how this is expected behavior. After infant's diaper change, she was alert and rooting. LC placed infant on the left breast in the football hold. LC reviewed belly to belly positioning and how to compress the breast for a deep latch. LC noted that [name] has severe areola edema at this time. The breast is not very compressible. LC provided [name] with coconut oil and reviewed reverse pressuring. Infant was still unable to latch at this time and lost interest fast. LC encouraged hand expressing and [name] agreed. LC was able to collect 0.5 mL of EBM due to the edema. LC finger fed EBM to infant. LC encouraged offering the formula and for [name] to pump. LC encouraged 5-10 minutes of reverse pressuring before every feeding.  LC reviewed the first 24 hr birthday nap, day 2 cluster feeding, feeding infant on cue 8-12x in 24 hrs, not allowing infant to go over 3 hrs without a feeding, CDC milk storage guidelines, LC services handout and engorgement/breast care. LC encouraged [name] to call for further assistance as needed.  Maternal Data Has patient been taught Hand Expression?: Yes Does the patient have breastfeeding experience prior to this delivery?: Yes How long did the patient breastfeed?: 2 weeks  Feeding Mother's Current Feeding Choice: Breast Milk and Formula Nipple Type: Extra Slow Flow  LATCH Score Latch: Too sleepy or reluctant, no latch achieved, no sucking elicited.  Audible Swallowing:  None  Type of Nipple: Everted at rest and after stimulation  Comfort (Breast/Nipple): Filling, red/small blisters or bruises, mild/mod discomfort  Hold (Positioning): Full assist, staff holds infant at breast  LATCH Score: 3   Lactation Tools Discussed/Used Tools: Pump;Flanges;Coconut oil Flange Size: 21 Breast pump type: Double-Electric Breast Pump;Manual Pump Education: Setup, frequency, and cleaning;Milk Storage Pumping frequency: 15-20 min every 3 hrs  Interventions Interventions: Breast feeding basics reviewed;Assisted with latch;Hand express;Reverse pressure;Breast compression;Adjust position;Support pillows;Position options;Expressed milk;Hand pump;DEBP;Coconut oil;Education;LC Services brochure  Discharge Discharge Education: Engorgement and breast care;Warning signs for feeding baby Pump: Manual;Hands Free;Personal;Referral sent for [facility] Pump  Consult Status Consult Status: Follow-up Date: 09/14/24 Follow-up type: In-patient    Recardo Hoit BS, IBCLC 09/13/2024, 9:20 PM

## 2024-09-13 NOTE — Progress Notes (Signed)
 Called [facility]. Informed about pt. BP 144/90 and pt asking about medications Protonix , Prednisone , Hydrochloroquine, Azathioprine , Protonix  that she is taking daily and is D/C on pt. Mar.    This nurse was informed that they will call back.

## 2024-09-13 NOTE — Anesthesia Postprocedure Evaluation (Signed)
"   Anesthesia Post Note  Patient: Kelly [name]  Procedure(s) Performed: CESAREAN DELIVERY (Abdomen)     Patient location during evaluation: Mother Baby Anesthesia Type: Epidural Level of consciousness: oriented and awake and alert Pain management: pain level controlled Vital Signs Assessment: post-procedure vital signs reviewed and stable Respiratory status: spontaneous breathing and respiratory function stable Cardiovascular status: blood pressure returned to baseline and stable Postop Assessment: no headache, no backache, no apparent nausea or vomiting and able to ambulate Anesthetic complications: no   No notable events documented.  Last Vitals:  Vitals:   09/13/24 1540 09/13/24 1620  BP: (!) 139/90 (!) 144/90  Pulse: 75 80  Resp: 16 16  Temp: 37.1 C 37.1 C  SpO2:      Last Pain:  Vitals:   09/13/24 1725  TempSrc:   PainSc: 0-No pain   Pain Goal:                   Garnette DELENA Gab      "

## 2024-09-13 NOTE — Op Note (Signed)
 [name] PROCEDURE DATE: 09/13/2024  PREOPERATIVE DIAGNOSES: Intrauterine pregnancy at [redacted]w[redacted]d weeks gestation; failure to progress: arrest of dilation and non-reassuring fetal status  POSTOPERATIVE DIAGNOSES: The same  PROCEDURE: Low Transverse Cesarean Section  SURGEON:  Eveline Lynwood MATSU, MD  ASSISTANT:  Dr. Barabara Maier. An experienced assistant was required given the standard of surgical care given the complexity of the case.  This assistant was needed for exposure, dissection, suctioning, retraction, instrument exchange, assisting with delivery with administration of fundal pressure, and for overall help during the procedure.  ANESTHESIOLOGY TEAM: Anesthesiologist: Jefm Garnette LABOR, MD; Mallory Manus, MD CRNA: Thomos Janetta DEL, CRNA  INDICATIONS: Kelly [name] is a 29 y.o. [phone] at [redacted]w[redacted]d here for cesarean section secondary to the indications listed under preoperative diagnoses; please see preoperative note for further details.  The risks of surgery were discussed with the patient including but were not limited to: bleeding which may require transfusion or reoperation; infection which may require antibiotics; injury to bowel, bladder, ureters or other surrounding organs; injury to the fetus; need for additional procedures including hysterectomy in the event of a life-threatening hemorrhage; formation of adhesions; placental abnormalities wth subsequent pregnancies; incisional problems; thromboembolic phenomenon and other postoperative/anesthesia complications.  The patient concurred with the proposed plan, giving informed written consent for the procedure.    FINDINGS:  Viable female infant in cephalic presentation.  Apgars 7 and 9.  Clear amniotic fluid.  Intact placenta, three vessel cord.  Normal uterus, fallopian tubes and ovaries bilaterally.  ANESTHESIA: Epidural  INTRAVENOUS FLUIDS: 1000 ml   ESTIMATED BLOOD LOSS: 454 ml URINE OUTPUT:  100 ml SPECIMENS: Placenta  sent to pathology COMPLICATIONS: None immediate  PROCEDURE IN DETAIL:  The patient preoperatively received intravenous antibiotics and had sequential compression devices applied to her lower extremities.  She was then taken to the operating room where epidural anesthesia was as dosed up to surgical level and was found to be adequate. She was then placed in a dorsal supine position with a leftward tilt, and prepped and draped in a sterile manner.  A foley catheter was placed into her bladder and attached to constant gravity.  After an adequate timeout was performed, a Pfannenstiel skin incision was made with scalpel on her preexisting scar and carried through to the underlying layer of fascia. The fascia was incised in the midline, and this incision was extended bilaterally in a blunt fashion.  The underlying rectus muscles were dissected off the fascia superiorly and inferiorly in a blunt fashion. The rectus muscles were separated in the midline and the peritoneum was entered bluntly. The fascia was incised in the midline, and this incision was extended bilaterally using the Mayo scissors.  Kocher clamps were applied to the superior aspect of the fascial incision and the underlying rectus muscles were dissected off bluntly and sharply.  A similar process was carried out on the inferior aspect of the fascial incision. The rectus muscles were separated in the midline and the peritoneum was entered bluntly. The Alexis self-retaining retractor was introduced into the abdominal cavity.  Attention was turned to the lower uterine segment where a low transverse hysterotomy was made with a scalpel and extended bilaterally bluntly.  The infant was successfully delivered, the cord was clamped and cut after one minute, and the infant was handed over to the awaiting neonatology team. Uterine massage was then administered, and the placenta delivered intact with a three-vessel cord. The uterus was then cleared of clots and  debris.  The  hysterotomy was closed with 0 Vicryl in a running locked fashion, and an imbricating layer was also placed with 0 Vicryl.  The pelvis was cleared of all clot and debris. Hemostasis was confirmed on all surfaces.  The retractor was removed.  The peritoneum was closed with a 0 Vicryl running stitch The fascia was then closed using 0 Vicryl in a running fashion.  The subcutaneous layer was irrigated, and the skin was closed with a 4-0 Vicryl subcuticular stitch. The patient tolerated the procedure well. Sponge, instrument and needle counts were correct x 3.  She was taken to the recovery room in stable condition.    Eveline Lynwood MATSU, MD Obstetrician & Gynecologist, [facility] for [facility], [facility]

## 2024-09-13 NOTE — Progress Notes (Signed)
 This RN was given this patient around 1700. Patient was questioning meds. Called first call L&D (spoke to [facility]) to clarify/inform of the following:   Recent high blood pressures Resuming the Amoxicillin  (Augmentin ) that patient was taking for tooth abscess (patient requesting it now)  Whether or not patient needs one time dose of Azithromax (not given earlier) Patient requesting Protonix  or Pepcid  for heartburn  Per Stormy, she will speak to MD about the following and put in orders accordingly.

## 2024-09-13 NOTE — Progress Notes (Signed)
 Patient took her own supply of Augmentin  just now for her tooth abscess. Stated she needed it by now. Nurse informed her that MD is putting in orders for it now so we will supply her with the Augmentin  from now on. Patient agreed.

## 2024-09-13 NOTE — Progress Notes (Addendum)
 Patient ID: Kelly [name], female   DOB: [birth year], 29 y.o.   MRN: [id number]   RN called regarding patients blood pressure. Multiple reading above 135/80. Procardia , lasix  and K ordered. Consider ordering CMP if blood pressures continue to be elevated.  Vitals:   09/13/24 1330 09/13/24 1404 09/13/24 1540 09/13/24 1620  BP: 131/80 (!) 137/92 (!) 139/90 (!) 144/90     Patient is also taking Augmentin  for a tooth abscess. Discussed with MD and placed order so she can continue that treatment while inpatient.  I was consulted RE: the exam and agree with the above note.  Claudene Arisbeth Purrington , CNM 09/13/2024 8:32 PM

## 2024-09-13 NOTE — Transfer of Care (Signed)
 Immediate Anesthesia Transfer of Care Note  Patient: Kelly [name]  Procedure(s) Performed: CESAREAN DELIVERY (Abdomen)  Patient Location: PACU  Anesthesia Type:Epidural  Level of Consciousness: awake, alert , and oriented  Airway & Oxygen Therapy: Patient Spontanous Breathing  Post-op Assessment: Report given to RN and Post -op Vital signs reviewed and stable  Post vital signs: Reviewed and stable  Last Vitals:  Vitals Value Taken Time  BP 122/78 09/13/24 12:30  Temp    Pulse 105 09/13/24 12:35  Resp 17 09/13/24 12:36  SpO2 97 % 09/13/24 12:35  Vitals shown include unfiled device data.  Last Pain:  Vitals:   09/13/24 1030  TempSrc:   PainSc: 0-No pain         Complications: No notable events documented.

## 2024-09-13 NOTE — Progress Notes (Signed)
 Labor Progress Note DYLAN RUOTOLO is a 29 y.o. G2P0101 at [redacted]w[redacted]d    BP 113/82   Pulse 72   Temp 98 F (36.7 C) (Oral)   Resp 18   Ht 5' 2 (1.575 m)   Wt 81.7 kg   LMP 12/27/2023 (Exact Date)   SpO2 100%   BMI 32.96 kg/m   EFM: 145/Moderate Variability/Accelerations (-),Decelerations (-)  CVE: Dilation: 5 Effacement (%): 50 Cervical Position: Posterior Station: -3 Presentation: Vertex Exam by:: Arek Spadafore MD  Patient had 8 minute deceleration likely secondary to tachysystole. Pitocin  was turned off and patient recovered with position changes and spacing of contractions. Since contractions have spaced out, fetal heart rate tolerating contractions well. Will keep pitocin  off for an hour and restart 2x2. Informed patent of what happened and all questions answered.   Deborah Lazcano LITTIE Angles, MD 3:04 AM

## 2024-09-14 ENCOUNTER — Encounter (HOSPITAL_COMMUNITY): Payer: Self-pay | Admitting: Obstetrics & Gynecology

## 2024-09-14 LAB — CERVICOVAGINAL ANCILLARY ONLY
Bacterial Vaginitis (gardnerella): NEGATIVE
Candida Glabrata: NEGATIVE
Candida Vaginitis: NEGATIVE
Chlamydia: NEGATIVE
Comment: NEGATIVE
Comment: NEGATIVE
Comment: NEGATIVE
Comment: NEGATIVE
Comment: NEGATIVE
Comment: NORMAL
Neisseria Gonorrhea: NEGATIVE
Trichomonas: NEGATIVE

## 2024-09-14 LAB — CBC
HCT: 28.8 % — ABNORMAL LOW (ref 36.0–46.0)
Hemoglobin: 10.1 g/dL — ABNORMAL LOW (ref 12.0–15.0)
MCH: 33.1 pg (ref 26.0–34.0)
MCHC: 35.1 g/dL (ref 30.0–36.0)
MCV: 94.4 fL (ref 80.0–100.0)
Platelets: 200 K/uL (ref 150–400)
RBC: 3.05 MIL/uL — ABNORMAL LOW (ref 3.87–5.11)
RDW: 14.5 % (ref 11.5–15.5)
WBC: 12.7 K/uL — ABNORMAL HIGH (ref 4.0–10.5)
nRBC: 0 % (ref 0.0–0.2)

## 2024-09-14 MED ORDER — FERROUS SULFATE 325 (65 FE) MG PO TABS
325.0000 mg | ORAL_TABLET | ORAL | Status: DC
  Administered 2024-09-14: 325 mg via ORAL
  Filled 2024-09-14: qty 1

## 2024-09-14 MED ORDER — ACETAMINOPHEN 500 MG PO TABS
1000.0000 mg | ORAL_TABLET | Freq: Four times a day (QID) | ORAL | Status: DC
  Administered 2024-09-14 – 2024-09-15 (×4): 1000 mg via ORAL
  Filled 2024-09-14 (×4): qty 2

## 2024-09-14 NOTE — Progress Notes (Signed)
 Pt would like to go home tomorrow if she is physically doing well

## 2024-09-14 NOTE — Lactation Note (Signed)
 This note was copied from a baby's chart. Lactation Consultation Note  Patient Name: Kelly [name] Unijb'd Date: 09/14/2024 Age:30 hours Reason for consult: Follow-up assessment;Mother's request;Early term 37-38.6wks;Infant < 6lbs;Breastfeeding assistance  P2- [name] called for latching assistance. [name] reports that she has been doing the reverse pressuring along with the manual pump and feels like it had softened her breast. LC assessed [name]'s areolas and noted less edema than prior consult. LC praised [name] and encouraged continuation of the reverse pressure and manual pump for the edema. [name] placed infant on the left breast in the football hold. LC reviewed stroking her nipple from infant's nose to chin to elicit a gaping mouth. Infant latched after a few attempts. Infant had flanged lips and a rhythmic pull. Infant was still very tired and needed some stimulation to sustain sucking motion. Infant nursed for 12 minutes. LC praised [name] and infant. LC encouraged [name] to call for further assistance as needed.  Maternal Data Has patient been taught Hand Expression?: Yes Does the patient have breastfeeding experience prior to this delivery?: Yes  Feeding Mother's Current Feeding Choice: Breast Milk and Formula  LATCH Score Latch: Repeated attempts needed to sustain latch, nipple held in mouth throughout feeding, stimulation needed to elicit sucking reflex.  Audible Swallowing: Spontaneous and intermittent  Type of Nipple: Everted at rest and after stimulation  Comfort (Breast/Nipple): Soft / non-tender  Hold (Positioning): Assistance needed to correctly position infant at breast and maintain latch.  LATCH Score: 8   Lactation Tools Discussed/Used Tools: Pump;Flanges;Coconut oil Flange Size: 18 Breast pump type: Double-Electric Breast Pump;Manual Pump Education: Setup, frequency, and cleaning;Milk Storage  Interventions Interventions: Breast feeding basics reviewed;Assisted with  latch;Reverse pressure;Hand express;Adjust position;Breast compression;Support pillows;Position options;Expressed milk;Education;LC Services brochure  Discharge Discharge Education: Engorgement and breast care;Warning signs for feeding baby Pump: Manual;Hands Free;Personal;Referral sent for [facility] Pump  Consult Status Consult Status: Follow-up Date: 09/14/24 Follow-up type: In-patient    Recardo Hoit BS, IBCLC 09/14/2024, 2:13 AM

## 2024-09-14 NOTE — Lactation Note (Addendum)
 This note was copied from a baby's chart. Lactation Consultation Note  Patient Name: Kelly [name] Date: 09/14/2024 Age:30 hours  Mother had requested LC to come to room at 1745 for breastfeeding assistance. Mother was sleeping when I arrived to room and baby was formula fed at 1505.   Appears mother has been breast and formula feeding as indicated with the low birth weight feeding plan. Last latch score was a 6.   Lactation will follow up with mother and baby at another time.      Consult Status  Follow up In-patient    [name] 09/14/2024, 5:58 PM

## 2024-09-14 NOTE — Progress Notes (Signed)
" °   09/14/24 1343 09/14/24 1410  Vital Signs  BP (!) 136/100 (Notified RN Kasra Melvin) (!) 132/100  MAP (mmHg) 116  --   BP Location Left Arm  --   Patient Position (if appropriate) High-fowlers  --   BP Method Automatic  --   Pulse Rate 82 87  Pulse Rate Source Dinamap  --   Resp 16  --   Temp  (na pt eating)  --   Oxygen Therapy  SpO2 100 %  --   O2 Device Room Air  --    Contacted Dr. Danny. Pt's pain is a 6 and declines oxy. Md ordered tylenol  and Q2 vs "

## 2024-09-14 NOTE — Progress Notes (Signed)
 POSTPARTUM PROGRESS NOTE  POD #1  Subjective:  Kelly [name] is a 29 y.o. [id number] s/p rLTCS at [redacted]w[redacted]d.  No acute events overnight. She reports she is doing well. She denies any problems with ambulating, voiding or po intake. Denies nausea or vomiting. She has not passed flatus. Pain is well controlled.  Lochia is minimal.  Objective: Blood pressure 130/83, pulse 84, temperature 98.6 F (37 C), temperature source Oral, resp. rate 16, height 5' 2 (1.575 m), weight 81.7 kg, last menstrual period 12/27/2023, SpO2 100%, unknown if currently breastfeeding.  Physical Exam:  General: alert, cooperative and no distress Chest: no respiratory distress Heart:regular rate, distal pulses intact Abdomen: soft, nontender,  Uterine Fundus: firm, appropriately tender DVT Evaluation: No calf swelling or tenderness Extremities: Trace edema Skin: warm, dry; pressure dressing in place  Recent Labs    09/12/24 2034 09/14/24 0233  HGB 10.9* 10.1*  HCT 31.5* 28.8*    Assessment/Plan: Kelly [name] is a 29 y.o. [id number] s/p rLTCS at [redacted]w[redacted]d for NRFHT.  POD#1 - Doing welll; pain well controlled. H/H appropriate  Routine postpartum care  OOB, ambulated  Lovenox  for VTE prophylaxis Anemia: asymptomatic: Start ferrous sulfate  every other day  gHTN: On lasix /Kcl/Nifedipine . Continue to monitor BP's.  Contraception: Declines Feeding: Breast SLE: Continue home medications Tooth abscess: Continue Augmentin  to finish treatment course.  Dispo: Plan for discharge in the next 24-48 hours.   LOS: 2 days   Barkley Angles, MD OB Fellow, Faculty Practice [facility], Center for [employer]

## 2024-09-15 ENCOUNTER — Other Ambulatory Visit (HOSPITAL_COMMUNITY): Payer: Self-pay

## 2024-09-15 LAB — CULTURE, BETA STREP (GROUP B ONLY): Strep Gp B Culture: NEGATIVE

## 2024-09-15 MED ORDER — NIFEDIPINE ER OSMOTIC RELEASE 30 MG PO TB24
60.0000 mg | ORAL_TABLET | Freq: Every day | ORAL | Status: DC
  Administered 2024-09-15: 60 mg via ORAL
  Filled 2024-09-15: qty 2

## 2024-09-15 MED ORDER — IBUPROFEN 600 MG PO TABS
600.0000 mg | ORAL_TABLET | Freq: Four times a day (QID) | ORAL | 0 refills | Status: AC
  Filled 2024-09-15: qty 30, 8d supply, fill #0

## 2024-09-15 MED ORDER — FUROSEMIDE 20 MG PO TABS
20.0000 mg | ORAL_TABLET | Freq: Every day | ORAL | 0 refills | Status: AC
  Filled 2024-09-15: qty 5, 5d supply, fill #0

## 2024-09-15 MED ORDER — ACETAMINOPHEN 500 MG PO TABS
1000.0000 mg | ORAL_TABLET | Freq: Four times a day (QID) | ORAL | 0 refills | Status: AC
  Filled 2024-09-15: qty 30, 4d supply, fill #0

## 2024-09-15 MED ORDER — NIFEDIPINE ER OSMOTIC RELEASE 30 MG PO TB24
30.0000 mg | ORAL_TABLET | Freq: Two times a day (BID) | ORAL | Status: DC
  Administered 2024-09-15: 30 mg via ORAL
  Filled 2024-09-15: qty 1

## 2024-09-15 MED ORDER — NIFEDIPINE ER OSMOTIC RELEASE 30 MG PO TB24
30.0000 mg | ORAL_TABLET | Freq: Once | ORAL | Status: DC
  Filled 2024-09-15: qty 1

## 2024-09-15 MED ORDER — FAMOTIDINE 20 MG PO TABS
20.0000 mg | ORAL_TABLET | Freq: Every day | ORAL | Status: DC
  Administered 2024-09-15: 20 mg via ORAL
  Filled 2024-09-15: qty 1

## 2024-09-15 MED ORDER — NIFEDIPINE ER 60 MG PO TB24
60.0000 mg | ORAL_TABLET | Freq: Every day | ORAL | 1 refills | Status: DC
  Filled 2024-09-15: qty 30, 30d supply, fill #0

## 2024-09-15 MED ORDER — FERROUS SULFATE 325 (65 FE) MG PO TABS
325.0000 mg | ORAL_TABLET | ORAL | 0 refills | Status: AC
  Filled 2024-09-15: qty 15, 30d supply, fill #0

## 2024-09-15 MED ORDER — SENNOSIDES-DOCUSATE SODIUM 8.6-50 MG PO TABS
2.0000 | ORAL_TABLET | Freq: Every day | ORAL | 0 refills | Status: AC
  Filled 2024-09-15: qty 60, 30d supply, fill #0

## 2024-09-15 MED ORDER — ENOXAPARIN SODIUM 40 MG/0.4ML IJ SOSY
40.0000 mg | PREFILLED_SYRINGE | INTRAMUSCULAR | 0 refills | Status: AC
  Filled 2024-09-15: qty 4, 10d supply, fill #0

## 2024-09-15 MED ORDER — POTASSIUM CHLORIDE CRYS ER 20 MEQ PO TBCR
20.0000 meq | EXTENDED_RELEASE_TABLET | Freq: Every day | ORAL | 0 refills | Status: AC
  Filled 2024-09-15: qty 5, 5d supply, fill #0

## 2024-09-15 NOTE — Lactation Note (Signed)
 This note was copied from a baby's chart. Lactation Consultation Note  Patient Name: Girl Sybol Morre Unijb'd Date: 09/15/2024 Age:30 hours Reason for consult: Follow-up assessment;Maternal discharge;Early term 37-38.6wks (change in weight loss -6.28 to -5.28%, [name] is pumping offering EBM and supplementing infant with formula due to having difficulties with latching infant at the breast.)See [name]: MR- C/S delivery and GHTN.   C/O: [name] having latch difficulties, areola edema,  infant less than 6 lbs and ETI  LC unable assess with latch at this time, infant was given 5 mls of EBM and 16 mls of formula prior to [facility] entering the room. LC observed [name] does have areola edema and LC refitted [name] flange size to 24 [name] was using 18 mm. [name] was pumping and expressing colostrum when using the Medela DEBP as LC left the room. [name] has been making breastfeeding attempts with infant, always attempt to latch first and then supplement infant with any EBM first and then formula. Per [name], she had latch difficulties with her first child so she exclusively pumped for  only 2 weeks . LC re-fitted [name] with size 24 mm breast flange.   [name] informed LC she has: hand pump, Spectra  2 DEBP and MOM Cozy hands free pump.  Discharge education: 1- [name] will continue to latch infant first every feeding, feed by cues, on demand, 8+ within 24 hours, skin to skin. 2-[name] will continue to use the DEBP after hospital discharge and pump every 3 hours to help establish and stimulate her milk supply as she works towards latching infant at the breast. 3- [name] will continue to supplement infant, [name] has handout Feeding Guidelines  4- [name] will be seen by [facility] for continue breastfeeding assistance with infant's latch  and support after hospital discharge, [facility] sent referral through EPIC.  Maternal Data    Feeding Mother's Current Feeding Choice: Breast Milk and Formula Nipple Type: Extra Slow Flow  LATCH Score LC  unable assist with latch at this time due to infant receiving 5 mls of EBM and 16 mls of formula prior to [facility] entering the room.                   Lactation Tools Discussed/Used Tools: Pump;Flanges Flange Size: 24 Breast pump type: Double-Electric Breast Pump Pump Education: Setup, frequency, and cleaning;Milk Storage Reason for Pumping: Latch difficulties, infant less than 6 lbs and ETI infant. Pumping frequency: [name] will continue to pump every 3 hours for 15 minutes. Pumped volume: 5 mL  Interventions Interventions: Expressed milk;Hand pump;DEBP;Education;Guidelines for Milk Supply and Pumping Schedule Handout;LC Services brochure;CDC milk storage guidelines;CDC Guidelines for Breast Pump Cleaning  Discharge Discharge Education: Engorgement and breast care;Warning signs for feeding baby;Outpatient recommendation;Outpatient Epic message sent Pump: DEBP;Manual;Hands Free;Personal (Per [name], she has hand pump, DEBP Spectra  2 and hands free MOM cozy.)  Consult Status Consult Status: Complete Date: 09/15/24 Follow-up type: In-patient    Grayce LULLA Batter 09/15/2024, 10:07 AM

## 2024-09-15 NOTE — Discharge Summary (Signed)
 "    Postpartum Discharge Summary     Patient Name: Kelly [name] DOB: Oct 08, 1995 MRN: [id number]  Date of admission: 09/12/2024 Delivery date:09/13/2024 Delivering provider: EVELINE LYNWOOD MATSU Date of discharge: 09/15/2024  Admitting diagnosis: Indication for care or intervention related to labor and delivery [O75.9] Intrauterine pregnancy: [redacted]w[redacted]d     Secondary diagnosis:  Principal Problem:   Indication for care or intervention related to labor and delivery Active Problems:   History of cesarean section   Systemic lupus erythematosus affecting pregnancy (HCC)   Gestational hypertension  Additional problems: N/A    Discharge diagnosis: Term Pregnancy Delivered and Gestational Hypertension                                              Post partum procedures:N/A Augmentation: AROM, Pitocin , Cytotec, and IP Foley Complications: None  Hospital course: Induction of Labor With Cesarean Section   29 y.o. yo G2P1102 at [redacted]w[redacted]d was admitted to the hospital 09/12/2024 for induction of labor. Patient had a labor course significant for NRFHT. The patient went for cesarean section due to Non-Reassuring FHR. Delivery details are as follows: Membrane Rupture Time/Date: 12:25 AM,09/13/2024  Delivery Method:C-Section, Low Transverse Operative Delivery:N/A Details of operation can be found in separate operative Note.  Patient had a postpartum course complicated by N/A. She is ambulating, tolerating a regular diet, passing flatus, and urinating well.  Patient is discharged home in stable condition on 09/15/24.      Patient continued to have elevated BP's postpartum despite 30mg  XR of nifedipine . Increased to 60mg  XR of nifedipine  this morning. Discussed with patient about staying an extra day to ensure adequate BP control but patient declines stating she needs to get home to her toddler. She denies headache, vision changes, RUQ pain. States she feels well. Rx sent for nifedipine  60mg  XR daily, 5 day course  of lasix  and potassium. Patient has incision check and BP check in 1 week. Babyscripts monitoring program ordered.   Oral iron therapy started for clinically significant postpartum anemia due to expected blood loss after delivery.   Patient with a hx of SLE. It was recommended by MFM that she remain on Lovenox  10 day PP if requiring a cesarean. Rx sent to patient pharmacy.   Newborn Data: Birth date:09/13/2024 Birth time:11:37 AM Gender:Female Living status:Living Apgars:7 ,9  Weight:2640 g                               Magnesium Sulfate received: No BMZ received: No Rhophylac:N/A MMR:N/A T-DaP:Given prenatally Flu: Yes RSV Vaccine received: No Transfusion:No  Immunizations received: Immunization History  Administered Date(s) Administered   Influenza, Seasonal, Injecte, Preservative Fre 08/05/2024   Influenza,inj,Quad PF,6+ Mos 09/02/2013, 07/15/2015, 07/13/2016, 10/01/2018, 05/04/2020, 06/01/2022   Moderna Covid-19 Fall Seasonal Vaccine 31yrs & older 09/17/2022   PFIZER(Purple Top)SARS-COV-2 Vaccination 07/11/2021   PPD Test 11/30/2013   Pneumococcal Conjugate-13 09/02/2013   Pneumococcal Polysaccharide-23 12/04/2013   Tdap 08/05/2024    Physical exam  Vitals:   09/15/24 0306 09/15/24 0310 09/15/24 0432 09/15/24 0608  BP: (!) 151/97 (!) 154/104 (!) 141/100 (!) 132/93  Pulse: 77  84 85  Resp:    18  Temp:    (!) 97.5 F (36.4 C)  TempSrc:    Oral  SpO2:    100%  Weight:  Height:       General: alert, cooperative, and no distress Lochia: appropriate Uterine Fundus: firm Incision: Healing well with no significant drainage, No significant erythema, Dressing is clean, dry, and intact DVT Evaluation: No evidence of DVT seen on physical exam. Labs: Lab Results  Component Value Date   WBC 12.7 (H) 09/14/2024   HGB 10.1 (L) 09/14/2024   HCT 28.8 (L) 09/14/2024   MCV 94.4 09/14/2024   PLT 200 09/14/2024      Latest Ref Rng & Units 09/12/2024    5:43 AM  CMP   Glucose 70 - 99 mg/dL 75   BUN 6 - 20 mg/dL 5   Creatinine 9.55 - 8.99 mg/dL 9.47   Sodium 864 - 854 mmol/L 136   Potassium 3.5 - 5.1 mmol/L 3.7   Chloride 98 - 111 mmol/L 104   CO2 22 - 32 mmol/L 20   Calcium  8.9 - 10.3 mg/dL 8.5   Total Protein 6.5 - 8.1 g/dL 6.8   Total Bilirubin 0.0 - 1.2 mg/dL 0.6   Alkaline Phos 38 - 126 U/L 63   AST 15 - 41 U/L 25   ALT 0 - 44 U/L 17    Edinburgh Score:    09/14/2024   10:18 PM  Edinburgh Postnatal Depression Scale Screening Tool  I have been able to laugh and see the funny side of things. 0  I have looked forward with enjoyment to things. 0  I have blamed myself unnecessarily when things went wrong. 1  I have been anxious or worried for no good reason. 0  I have felt scared or panicky for no good reason. 0  Things have been getting on top of me. 0  I have been so unhappy that I have had difficulty sleeping. 0  I have felt sad or miserable. 0  I have been so unhappy that I have been crying. 0  The thought of harming myself has occurred to me. 0  Edinburgh Postnatal Depression Scale Total 1   Edinburgh Postnatal Depression Scale Total: 1   After visit meds:  Allergies as of 09/15/2024       Reactions   Doxycycline  Hives, Rash, Other (See Comments)   Blister patches on skin Bullous eruption, required hospital treatment 01/2022   Methotrexate Other (See Comments)   Other Reaction(s): hairloss        Medication List     STOP taking these medications    aspirin  EC 81 MG tablet   Blood Pressure Kit Devi   Excedrin  Tension Headache 500-65 MG Tabs per tablet Generic drug: acetaminophen -caffeine        TAKE these medications    acetaminophen  500 MG tablet Commonly known as: TYLENOL  Take 2 tablets (1,000 mg total) by mouth every 6 (six) hours. What changed:  when to take this reasons to take this   albuterol  108 (90 Base) MCG/ACT inhaler Commonly known as: VENTOLIN  HFA Inhale 2 puffs into the lungs as needed for  wheezing or shortness of breath.   amoxicillin -clavulanate 875-125 MG tablet Commonly known as: AUGMENTIN  Take 1 tablet by mouth every 12 (twelve) hours.   azaTHIOprine  50 MG tablet Commonly known as: IMURAN  Take 50 mg by mouth daily.   B-12 1000 MCG Tabs Take 1 tablet (1,000 mcg total) by mouth daily.   budesonide -formoterol  80-4.5 MCG/ACT inhaler Commonly known as: Symbicort  Inhale 2 puffs into the lungs 2 (two) times daily. Until illness clears.   cholecalciferol 25 MCG (1000 UNIT) tablet Commonly known as:  VITAMIN D3 Take 1,000 Units by mouth once a week.   clobetasol cream 0.05 % Commonly known as: TEMOVATE Apply 1 Application topically 2 (two) times daily.   enoxaparin  40 MG/0.4ML injection Commonly known as: LOVENOX  Inject 0.4 mLs (40 mg total) into the skin daily.   ferrous sulfate  325 (65 FE) MG tablet Take 1 tablet (325 mg total) by mouth every other day. Start taking on: September 16, 2024   fluticasone  50 MCG/ACT nasal spray Commonly known as: FLONASE  Place 2 sprays into both nostrils daily.   furosemide  20 MG tablet Commonly known as: LASIX  Take 1 tablet (20 mg total) by mouth daily.   hydroxychloroquine  200 MG tablet Commonly known as: PLAQUENIL  Take 400 mg by mouth daily.   ibuprofen  600 MG tablet Commonly known as: ADVIL  Take 1 tablet (600 mg total) by mouth every 6 (six) hours.   NIFEdipine  60 MG 24 hr tablet Commonly known as: Procardia  XL Take 1 tablet (60 mg total) by mouth daily.   pantoprazole  40 MG tablet Commonly known as: PROTONIX  TAKE 1 TABLET BY MOUTH EVERY DAY   potassium chloride  SA 20 MEQ tablet Commonly known as: KLOR-CON  M Take 1 tablet (20 mEq total) by mouth daily.   Prenatal 28-0.8 MG Tabs Take 1 tablet by mouth daily.   senna-docusate 8.6-50 MG tablet Commonly known as: Senokot-S Take 2 tablets by mouth daily.               Discharge Care Instructions  (From admission, onward)           Start      Ordered   09/15/24 0000  Discharge wound care:       Comments: You may remove the honeycomb when your baby is 67 days old by carefully working up both edges and then pulling both ends apart horizontally. The honeycomb is designed to gently release from the incision when removed in this fashion.   09/15/24 0709             Discharge home in stable condition Infant Feeding: Breast Infant Disposition:home with mother Discharge instruction: per After Visit Summary and Postpartum booklet. Activity: Advance as tolerated. Pelvic rest for 6 weeks.  Diet: routine diet Future Appointments: Future Appointments  Date Time Provider Department Center  10/19/2024  2:30 PM Erik Kieth BROCKS, MD [facility] None   Follow up Visit:   Please schedule this patient for a In person postpartum visit in 6 weeks with the following provider: Any provider. Additional Postpartum F/U:Incision check 1 week and BP check 1 week  High risk pregnancy complicated by: HTN Delivery mode:  C-Section, Low Transverse Anticipated Birth Control:  Declines   09/15/2024 Jeanene Mena LITTIE Angles, MD    "

## 2024-09-15 NOTE — Patient Instructions (Signed)
 If you are interested in an outpatient lactation consultation -- available in-office or virtually -- please reach out to us  at:  [facility] (First Floor) ?? [address], [place], [place]  ?? [phone] Please leave a message on our lactation voicemail box. We welcome any lactation-related questions or concerns -- our team is here to support you and your baby.  Lactation Support Groups Join us  at: [facility] ?? Tuesdays, 10:00 AM - 12:00 PM ?? [address], [place] [employer], [place]  Lactating parents and lap babies are welcome!  ?? [email or web address]  ?? [email or web address] -------------     Kelly [name], IBCLC [facility]

## 2024-09-15 NOTE — Lactation Note (Signed)
 This note was copied from a baby's chart. Lactation Consultation Note  Patient Name: Kelly [name] Date: 09/15/2024 Age:30 hours Reason for consult: Follow-up assessment;Mother's request;Difficult latch;Early term 37-38.6wks;Infant < 6lbs;Infant weight loss;Breastfeeding assistance;RN request  P2- [name] called out for latching assistance. [name] reports that infant has been hangry when trying to latch today. [name] has been using the reverse pressuring techniques, so her breast tissue is much more compressible. Infant was placed on the left breast in the football hold. Infant quickly became frustrated with the breast. LC placed a 24 mm nipple shield on [name] with formula to help stimulate infant. Infant was more receptive to this and nursed for 7 minutes before falling asleep. LC encouraged [name] to use this tool only if infant is struggling to latch and needs extra stimulation. LC reviewed cluster feeding, feeding infant on cue 8-12x in 24 hrs, not allowing infant to go over 3 hrs without a feeding, CDC milk staoreg guidelines, LC services handout and engorgement/breast care.  Maternal Data Has patient been taught Hand Expression?: Yes Does the patient have breastfeeding experience prior to this delivery?: Yes  Feeding Mother's Current Feeding Choice: Breast Milk and Formula Nipple Type: Extra Slow Flow  LATCH Score Latch: Repeated attempts needed to sustain latch, nipple held in mouth throughout feeding, stimulation needed to elicit sucking reflex.  Audible Swallowing: A few with stimulation  Type of Nipple: Everted at rest and after stimulation  Comfort (Breast/Nipple): Filling, red/small blisters or bruises, mild/mod discomfort  Hold (Positioning): Full assist, staff holds infant at breast  LATCH Score: 5   Lactation Tools Discussed/Used Tools: Pump;Flanges;Coconut oil;Nipple Shields Nipple shield size: 24 Flange Size: 24 Breast pump type: Double-Electric Breast  Pump;Manual Pump Education: Setup, frequency, and cleaning;Milk Storage  Interventions Interventions: Breast feeding basics reviewed;Assisted with latch;Hand express;Reverse pressure;Breast compression;Adjust position;Support pillows;Position options;Education;LC Services brochure  Discharge Discharge Education: Engorgement and breast care;Warning signs for feeding baby Pump: DEBP;Personal  Consult Status Consult Status: Follow-up Date: 09/16/24 Follow-up type: In-patient    Recardo Hoit BS, IBCLC 09/15/2024, 1:09 AM

## 2024-09-15 NOTE — Progress Notes (Signed)
 Blood pressure consistent with prior reading. Patient discharged as discussed with Dr. Danny with BP cuff, instructions on how and when to measure BP at home, medications, and medication administration instructions. Patient verbalized understanding. All questions answered at this time, instructed to call provider if she thinks of any additional questions.

## 2024-09-15 NOTE — Progress Notes (Signed)
 [name] was referred for history of depression/anxiety.  * Referral screened out by Clinical Social Worker because none of the following criteria appear to apply:  ~ History of anxiety/depression during this pregnancy, or of post-partum depression following prior delivery.  ~ Diagnosis of anxiety and/or depression within last 3 years  Per OB notes, [name] did not indicate any signs/symptoms during the pregnancy.  Edinburgh 1  Anxiety/Depression 2015  OR  * [name]'s symptoms currently being treated with medication and/or therapy.  Please contact the Clinical Social Worker if needs arise, by [facility] request, or if [name] scores greater than 9/yes to question 10 on Edinburgh Postpartum Depression Screen.  Rosina Molt, [place] Clinical Social Worker [phone]

## 2024-09-16 LAB — SURGICAL PATHOLOGY

## 2024-09-17 ENCOUNTER — Telehealth: Payer: Self-pay | Admitting: *Deleted

## 2024-09-17 ENCOUNTER — Ambulatory Visit: Payer: Self-pay

## 2024-09-17 NOTE — Telephone Encounter (Signed)
 Pt left message on nurse voicemail stating that she missed a call @ 9:40. She requests a call back.

## 2024-09-18 ENCOUNTER — Ambulatory Visit: Payer: Self-pay

## 2024-09-18 VITALS — BP 143/102 | HR 82 | Wt 158.8 lb

## 2024-09-18 DIAGNOSIS — Z0130 Encounter for examination of blood pressure without abnormal findings: Secondary | ICD-10-CM

## 2024-09-18 MED ORDER — NIFEDIPINE ER 60 MG PO TB24: 60.0000 mg | ORAL_TABLET | Freq: Two times a day (BID) | ORAL | 0 refills | Status: AC

## 2024-09-18 NOTE — Progress Notes (Signed)
..  Subjective:  Kelly [name] is a 29 y.o. postpartum female here for BP check. Pt reports taking Nifedipine  60mg  Daily as prescribed. Pt states that she has been getting elevated BP readings at home, similar to the one today. Reports no abnormal symptoms.  Hypertension ROS: taking medications as instructed, no medication side effects noted, no TIA's, no chest pain on exertion, no dyspnea on exertion, and no swelling of ankles.    Objective:  BP (!) 143/102   Pulse 82   Wt 158 lb 12.8 oz (72 kg)   LMP 12/27/2023 (Exact Date)   Breastfeeding Yes   BMI 29.04 kg/m   Appearance alert, well appearing, and in no distress. General exam BP noted to need improvement.    Assessment:   Blood Pressure asymptomatic and needs improvement.   Plan:  The following changes are to be made: Consulted with Dr. Erik in office and advised pt to increase Nifedipine  to 60mg  BID Daily and return in 1 week for follow up BP check. Advised of abnormal symptoms and to notify office or report to mau if they occur.

## 2024-09-21 ENCOUNTER — Ambulatory Visit: Payer: Self-pay | Admitting: Physician Assistant

## 2024-09-24 ENCOUNTER — Ambulatory Visit (INDEPENDENT_AMBULATORY_CARE_PROVIDER_SITE_OTHER): Payer: Self-pay

## 2024-09-24 VITALS — BP 121/87 | HR 92 | Ht 61.0 in | Wt 157.8 lb

## 2024-09-24 DIAGNOSIS — O133 Gestational [pregnancy-induced] hypertension without significant proteinuria, third trimester: Secondary | ICD-10-CM

## 2024-09-24 NOTE — Progress Notes (Signed)
 Subjective:  Kelly [name] is a 29 y.o. female here for BP check. Taking nifedipine  60mg  BID as instructed.  Hypertension ROS: taking medications as instructed, no medication side effects noted, patient does not perform home BP monitoring, no TIA's, no chest pain on exertion, no dyspnea on exertion, no swelling of ankles, and no orthostatic dizziness or lightheadedness.    Objective:  BP 121/87 (BP Location: Right Arm, Patient Position: Sitting, Cuff Size: Normal)   Pulse 92   Ht 5' 1 (1.549 m)   Wt 157 lb 12.8 oz (71.6 kg)   LMP 12/27/2023 (Exact Date)   Breastfeeding Yes   BMI 29.82 kg/m   Appearance alert, well appearing, and in no distress and oriented to person, place, and time. General exam BP noted to be well controlled today in office.    Assessment:   Blood Pressure well controlled and asymptomatic.   Plan:  PP visit on 2/23 .

## 2024-10-19 ENCOUNTER — Ambulatory Visit: Payer: Self-pay | Admitting: Obstetrics and Gynecology
